# Patient Record
Sex: Female | Born: 1950
Health system: Southern US, Community
[De-identification: ages and names within clinical notes are randomized; demographics above are authoritative.]

## PROBLEM LIST (undated history)

## (undated) DIAGNOSIS — J439 Emphysema, unspecified: Secondary | ICD-10-CM

## (undated) DIAGNOSIS — I10 Essential (primary) hypertension: Secondary | ICD-10-CM

## (undated) DIAGNOSIS — F419 Anxiety disorder, unspecified: Secondary | ICD-10-CM

## (undated) DIAGNOSIS — E039 Hypothyroidism, unspecified: Secondary | ICD-10-CM

## (undated) DIAGNOSIS — E785 Hyperlipidemia, unspecified: Secondary | ICD-10-CM

## (undated) DIAGNOSIS — F32A Depression, unspecified: Secondary | ICD-10-CM

## (undated) DIAGNOSIS — Z8619 Personal history of other infectious and parasitic diseases: Secondary | ICD-10-CM

## (undated) DIAGNOSIS — I2510 Atherosclerotic heart disease of native coronary artery without angina pectoris: Secondary | ICD-10-CM

## (undated) DIAGNOSIS — H269 Unspecified cataract: Secondary | ICD-10-CM

## (undated) DIAGNOSIS — M797 Fibromyalgia: Secondary | ICD-10-CM

## (undated) DIAGNOSIS — G4710 Hypersomnia, unspecified: Secondary | ICD-10-CM

## (undated) DIAGNOSIS — F319 Bipolar disorder, unspecified: Secondary | ICD-10-CM

## (undated) DIAGNOSIS — M1990 Unspecified osteoarthritis, unspecified site: Secondary | ICD-10-CM

## (undated) DIAGNOSIS — K219 Gastro-esophageal reflux disease without esophagitis: Secondary | ICD-10-CM

## (undated) DIAGNOSIS — J189 Pneumonia, unspecified organism: Secondary | ICD-10-CM

## (undated) DIAGNOSIS — I5020 Unspecified systolic (congestive) heart failure: Secondary | ICD-10-CM

## (undated) DIAGNOSIS — R0600 Dyspnea, unspecified: Secondary | ICD-10-CM

## (undated) DIAGNOSIS — I255 Ischemic cardiomyopathy: Secondary | ICD-10-CM

## (undated) DIAGNOSIS — G4730 Sleep apnea, unspecified: Secondary | ICD-10-CM

## (undated) DIAGNOSIS — J449 Chronic obstructive pulmonary disease, unspecified: Secondary | ICD-10-CM

## (undated) DIAGNOSIS — E119 Type 2 diabetes mellitus without complications: Secondary | ICD-10-CM

## (undated) HISTORY — DX: Atherosclerotic heart disease of native coronary artery without angina pectoris: I25.10

## (undated) HISTORY — DX: Unspecified cataract: H26.9

## (undated) HISTORY — DX: Hypothyroidism, unspecified: E03.9

## (undated) HISTORY — DX: Essential (primary) hypertension: I10

## (undated) HISTORY — DX: Hyperlipidemia, unspecified: E78.5

## (undated) HISTORY — DX: Unspecified systolic (congestive) heart failure: I50.20

## (undated) HISTORY — DX: Dyspnea, unspecified: R06.00

## (undated) HISTORY — DX: Emphysema, unspecified: J43.9

## (undated) HISTORY — DX: Fibromyalgia: M79.7

## (undated) HISTORY — DX: Ischemic cardiomyopathy: I25.5

## (undated) HISTORY — PX: JOINT REPLACEMENT: SHX530

## (undated) HISTORY — DX: Sleep apnea, unspecified: G47.30

## (undated) HISTORY — DX: Gastro-esophageal reflux disease without esophagitis: K21.9

## (undated) HISTORY — DX: Chronic obstructive pulmonary disease, unspecified: J44.9

## (undated) HISTORY — DX: Anxiety disorder, unspecified: F41.9

## (undated) HISTORY — DX: Hypersomnia, unspecified: G47.10

## (undated) HISTORY — DX: Type 2 diabetes mellitus without complications: E11.9

## (undated) HISTORY — DX: Bipolar disorder, unspecified: F31.9

## (undated) HISTORY — DX: Depression, unspecified: F32.A

## (undated) HISTORY — DX: Personal history of other infectious and parasitic diseases: Z86.19

---

## 1999-02-28 ENCOUNTER — Other Ambulatory Visit: Admission: RE | Admit: 1999-02-28 | Discharge: 1999-02-28 | Payer: Self-pay | Admitting: Gynecology

## 1999-03-29 ENCOUNTER — Other Ambulatory Visit: Admission: RE | Admit: 1999-03-29 | Discharge: 1999-03-29 | Payer: Self-pay | Admitting: Gynecology

## 1999-03-29 ENCOUNTER — Encounter (INDEPENDENT_AMBULATORY_CARE_PROVIDER_SITE_OTHER): Payer: Self-pay | Admitting: Specialist

## 2000-04-16 ENCOUNTER — Other Ambulatory Visit: Admission: RE | Admit: 2000-04-16 | Discharge: 2000-04-16 | Payer: Self-pay | Admitting: Gynecology

## 2000-04-20 ENCOUNTER — Encounter (INDEPENDENT_AMBULATORY_CARE_PROVIDER_SITE_OTHER): Payer: Self-pay | Admitting: Specialist

## 2000-04-20 ENCOUNTER — Other Ambulatory Visit: Admission: RE | Admit: 2000-04-20 | Discharge: 2000-04-20 | Payer: Self-pay | Admitting: Gynecology

## 2000-04-27 ENCOUNTER — Encounter: Payer: Self-pay | Admitting: Gynecology

## 2000-04-27 ENCOUNTER — Encounter: Admission: RE | Admit: 2000-04-27 | Discharge: 2000-04-27 | Payer: Self-pay | Admitting: Gynecology

## 2000-10-18 ENCOUNTER — Encounter: Admission: RE | Admit: 2000-10-18 | Discharge: 2000-10-18 | Payer: Self-pay | Admitting: Gynecology

## 2000-10-18 ENCOUNTER — Encounter: Payer: Self-pay | Admitting: Gynecology

## 2000-10-26 ENCOUNTER — Encounter: Payer: Self-pay | Admitting: Gynecology

## 2000-10-26 ENCOUNTER — Encounter: Admission: RE | Admit: 2000-10-26 | Discharge: 2000-10-26 | Payer: Self-pay | Admitting: Gynecology

## 2001-04-23 ENCOUNTER — Other Ambulatory Visit: Admission: RE | Admit: 2001-04-23 | Discharge: 2001-04-23 | Payer: Self-pay | Admitting: Gynecology

## 2001-08-21 HISTORY — PX: BREAST CYST ASPIRATION: SHX578

## 2001-10-28 ENCOUNTER — Encounter: Admission: RE | Admit: 2001-10-28 | Discharge: 2001-10-28 | Payer: Self-pay | Admitting: Gynecology

## 2001-10-28 ENCOUNTER — Encounter: Payer: Self-pay | Admitting: Gynecology

## 2002-05-06 ENCOUNTER — Other Ambulatory Visit: Admission: RE | Admit: 2002-05-06 | Discharge: 2002-05-06 | Payer: Self-pay | Admitting: Gynecology

## 2002-08-21 HISTORY — PX: TOTAL ABDOMINAL HYSTERECTOMY: SHX209

## 2002-11-24 ENCOUNTER — Encounter: Payer: Self-pay | Admitting: Gynecology

## 2002-11-24 ENCOUNTER — Encounter: Admission: RE | Admit: 2002-11-24 | Discharge: 2002-11-24 | Payer: Self-pay | Admitting: Gynecology

## 2002-11-24 ENCOUNTER — Encounter (INDEPENDENT_AMBULATORY_CARE_PROVIDER_SITE_OTHER): Payer: Self-pay | Admitting: *Deleted

## 2003-12-16 ENCOUNTER — Encounter: Admission: RE | Admit: 2003-12-16 | Discharge: 2003-12-16 | Payer: Self-pay | Admitting: Internal Medicine

## 2004-08-21 HISTORY — PX: KNEE SURGERY: SHX244

## 2004-08-21 HISTORY — PX: ESOPHAGOGASTRODUODENOSCOPY: SHX1529

## 2004-11-01 ENCOUNTER — Encounter: Admission: RE | Admit: 2004-11-01 | Discharge: 2004-11-01 | Payer: Self-pay | Admitting: Internal Medicine

## 2004-12-21 ENCOUNTER — Ambulatory Visit: Payer: Self-pay | Admitting: Unknown Physician Specialty

## 2005-06-08 ENCOUNTER — Ambulatory Visit: Payer: Self-pay | Admitting: Cardiovascular Disease

## 2005-09-14 ENCOUNTER — Ambulatory Visit: Payer: Self-pay | Admitting: Internal Medicine

## 2005-09-28 ENCOUNTER — Ambulatory Visit: Payer: Self-pay | Admitting: Gastroenterology

## 2005-10-23 ENCOUNTER — Ambulatory Visit: Payer: Self-pay | Admitting: Specialist

## 2005-12-13 ENCOUNTER — Encounter: Admission: RE | Admit: 2005-12-13 | Discharge: 2005-12-13 | Payer: Self-pay | Admitting: Internal Medicine

## 2006-12-17 ENCOUNTER — Encounter: Admission: RE | Admit: 2006-12-17 | Discharge: 2006-12-17 | Payer: Self-pay | Admitting: Internal Medicine

## 2010-04-27 ENCOUNTER — Emergency Department: Payer: Self-pay | Admitting: Emergency Medicine

## 2010-05-20 ENCOUNTER — Ambulatory Visit: Payer: Self-pay | Admitting: Family Medicine

## 2010-08-21 DIAGNOSIS — E119 Type 2 diabetes mellitus without complications: Secondary | ICD-10-CM

## 2010-08-21 DIAGNOSIS — R0600 Dyspnea, unspecified: Secondary | ICD-10-CM

## 2010-08-21 HISTORY — DX: Type 2 diabetes mellitus without complications: E11.9

## 2010-08-21 HISTORY — DX: Dyspnea, unspecified: R06.00

## 2010-09-20 NOTE — Assessment & Plan Note (Signed)
Summary: FLU SHOT/EVM  NURSE VISIT - FLU SHOT ONLY  Assessment New Problems: NEED PROPHYLACTIC VACCINATION&INOCULATION FLU (ICD-V04.81)   The patient and/or caregiver has been counseled thoroughly with regard to medications prescribed including dosage, schedule, interactions, rationale for use, and possible side effects and they verbalize understanding.  Diagnoses and expected course of recovery discussed and will return if not improved as expected or if the condition worsens. Patient and/or caregiver verbalized understanding.   Orders Added: 1)  Flu Vaccine 13yrs + [16109]   Immunizations Administered:  Influenza Vaccine:    Vaccine Type: Fluzone    Site: left deltoid    Mfr: Sanofi Pasteur    Dose: 0.5 ml    Route: IM    Given by: Tacey Ruiz MD    Exp. Date: 02/18/2011    Lot #: [id number]    VIS given: 03/15/10 version given May 20, 2010.  Flu Vaccine Consent Questions:    Do you have a history of severe allergic reactions to this vaccine? no    Any prior history of allergic reactions to egg and/or gelatin? no    Do you have a sensitivity to the preservative Thimersol? no    Do you have a past history of Guillan-Barre Syndrome? no    Do you currently have an acute febrile illness? no    Have you ever had a severe reaction to latex? no    Vaccine information given and explained to patient? yes    Are you currently pregnant? no

## 2011-01-02 ENCOUNTER — Encounter: Payer: Self-pay | Admitting: Pulmonary Disease

## 2011-01-03 ENCOUNTER — Encounter: Payer: Self-pay | Admitting: Pulmonary Disease

## 2011-01-03 ENCOUNTER — Ambulatory Visit (INDEPENDENT_AMBULATORY_CARE_PROVIDER_SITE_OTHER): Payer: Self-pay | Admitting: Pulmonary Disease

## 2011-01-03 ENCOUNTER — Ambulatory Visit (INDEPENDENT_AMBULATORY_CARE_PROVIDER_SITE_OTHER)
Admission: RE | Admit: 2011-01-03 | Discharge: 2011-01-03 | Disposition: A | Discharge status: Home / Self Care | Payer: Self-pay | Source: Ambulatory Visit | Attending: Pulmonary Disease | Admitting: Pulmonary Disease

## 2011-01-03 VITALS — BP 140/82 | HR 75 | Temp 98.2°F | Ht 64.0 in | Wt 267.8 lb

## 2011-01-03 DIAGNOSIS — R0989 Other specified symptoms and signs involving the circulatory and respiratory systems: Secondary | ICD-10-CM

## 2011-01-03 DIAGNOSIS — R0600 Dyspnea, unspecified: Secondary | ICD-10-CM

## 2011-01-03 DIAGNOSIS — R0609 Other forms of dyspnea: Secondary | ICD-10-CM

## 2011-01-03 NOTE — Patient Instructions (Signed)
Chest xray today>>will call with results Spirometry today Follow up in July 2012

## 2011-01-03 NOTE — Progress Notes (Signed)
Subjective:    Patient ID: [name], female    DOB: March 25, 1951, 60 y.o.   MRN: [id number]  HPI 60 yo female with dyspnea.  She has noticed trouble with her breathing since 2008.  She went on disability at that time due to Bipolar disease.  She has since gained 70 lbs.  She has a long history of smoking, but quit 9 years ago.  She was told that she has COPD.  She has been using inhalers, but still has trouble with her breathing.  She now gets trouble even at rest.  She can't do any activity w/o getting winded.  She has an occasional dry cough.  She will wheeze and chest tightness sometimes.  She denies hemoptysis.  She gets palpitations with activity.  She has trouble sleeping when she lays flat, and feels like her breathing stops.  She does snore.  She will get leg swelling.  She denies fever, sore throat, or skin rashes.  She had a heart catheterization twice, and was told she had non-occlusive CAD.  She had pneumonia several years ago, and says she used to get these every couple of years.  She denies allergies.  She moved from [place] to [place] 17 yrs ago.  She also lived in [place] for 4 years.  She denies any recent travel.  She was on a blood pressure medicine, and says that this made her breathing much worse.  She does not recall which medicine this is.  She has a Emergency planning/management officer and dog.  She denies recent sick exposures.    She has not had insurance for several years, and has not been able to keep regular medical follow up appointments due to lack of resources.  She is due to qualify for Medicare part B in July.  Past Medical History  Diagnosis Date  . Migraine   . GERD (gastroesophageal reflux disease)   . Anxiety   . Chronic bronchitis   . Hypertension   . Bipolar disorder   . Coronary artery disease, non-occlusive   . Hypothyroidism      Family History  Problem Relation Age of Onset  . COPD Sister   . Asthma Maternal Uncle   . Lung cancer Cousin      History    Social History  . Marital Status: Single    Spouse Name: N/A    Number of Children: N/A  . Years of Education: N/A   Occupational History  . disabled    Social History Main Topics  . Smoking status: Former Smoker -- 1.0 packs/day for 43 years    Types: Cigarettes    Quit date: 08/21/2009  . Smokeless tobacco: Not on file  . Alcohol Use: No  . Drug Use: No  . Sexually Active: Not on file   Other Topics Concern  . Not on file   Social History Narrative  . No narrative on file     Allergies  Allergen Reactions  . Lithium     Was hospitalized     No outpatient prescriptions prior to visit.      Review of Systems  Constitutional: Positive for unexpected weight change.  HENT: Positive for trouble swallowing.   Respiratory: Positive for cough and shortness of breath.   Cardiovascular: Positive for chest pain and palpitations.  Gastrointestinal: Positive for abdominal pain.  Musculoskeletal: Positive for joint swelling.  Neurological: Positive for headaches.  Psychiatric/Behavioral: Positive for dysphoric mood.       Objective:  Physical Exam Filed Vitals:   01/03/11 1603  BP: 140/82  Pulse: 75  Temp: 98.2 F (36.8 C)  TempSrc: Oral  Height: 5\' 4"  (1.626 m)  Weight: 267 lb 12.8 oz (121.473 kg)  SpO2: 98%   General - Obese HEENT - wears glasses, PERRLA, EOMI, no sinus tenderness, no nasal discharge, MP 3, no oral exudate, no LAN Cardiac - s1s2, no murmur, peripheral pulses symmetric Chest - diminished breath sounds, no wheeze/rales/dullness Abd - obese, soft, non-tender, no masses, normal bowel sounds Ext - 1+ non-pitting ankle edema, no cyanosis/clubbing Neuro - normal strength, CN intact, A&O x 3 Psych - normal mood/behavior Skin - no rashes    Spirometry 01/03/11>>FEV1 2.22(89%), FEV1% 79 Assessment & Plan:   Dyspnea This is likely multifactorial.    She has a history of tobacco abuse, and carries a diagnosis of COPD.  However spirometry  today did not show evidence for airflow obstruction.  She has noticed some symptomatic relief with inhaler therapy, and will continue this for now.  I have given her samples of symbicort, and encouraged her to maintain her smoking cessation.  She has a history of hypertension, and certainly could have diastolic dysfunction.  Ideally, she would need to have a more complete evaluation with echocardiogram.  She is to continue with her blood pressure medications.  She has gained a significant amount of weight over the past few years, and deconditioning is likely playing a role with her dyspnea.  She likely also has sleep disordered breathing and would need to have a sleep test.  She is concerned about inability to pay for tests needed at this time.  She is agreeable to get a chest xray.  I will call her with the results of this.  Will then plan for follow up in July when she has Medicare part B, and can have further testing done.  Advised her to call if needed before this should her symptoms get worse.    Updated Medication List Outpatient Encounter Prescriptions as of 01/03/2011  Medication Sig Dispense Refill  . albuterol (PROVENTIL) (2.5 MG/3ML) 0.083% nebulizer solution 1 vial twice a day       . budesonide-formoterol (SYMBICORT) 80-4.5 MCG/ACT inhaler Inhale 2 puffs into the lungs 2 (two) times daily.        . diazepam (VALIUM) 5 MG tablet As needed       . levothyroxine (SYNTHROID, LEVOTHROID) 75 MCG tablet Take 75 mcg by mouth daily.        . metoprolol (LOPRESSOR) 50 MG tablet Take 50 mg by mouth 2 (two) times daily.        . QUEtiapine (SEROQUEL) 100 MG tablet Once at night       . triamterene-hydrochlorothiazide (DYAZIDE) 37.5-25 MG per capsule Take 1 capsule by mouth daily.

## 2011-01-03 NOTE — Assessment & Plan Note (Addendum)
This is likely multifactorial.    She has a history of tobacco abuse, and carries a diagnosis of COPD.  However spirometry today did not show evidence for airflow obstruction.  She has noticed some symptomatic relief with inhaler therapy, and will continue this for now.  I have given her samples of symbicort, and encouraged her to maintain her smoking cessation.  She has a history of hypertension, and certainly could have diastolic dysfunction.  Ideally, she would need to have a more complete evaluation with echocardiogram.  She is to continue with her blood pressure medications.  She has gained a significant amount of weight over the past few years, and deconditioning is likely playing a role with her dyspnea.  She likely also has sleep disordered breathing and would need to have a sleep test.  She is concerned about inability to pay for tests needed at this time.  She is agreeable to get a chest xray.  I will call her with the results of this.  Will then plan for follow up in July when she has Medicare part B, and can have further testing done.  Advised her to call if needed before this should her symptoms get worse.

## 2011-01-04 ENCOUNTER — Encounter: Payer: Self-pay | Admitting: Pulmonary Disease

## 2011-01-05 ENCOUNTER — Telehealth: Payer: Self-pay | Admitting: Pulmonary Disease

## 2011-01-05 NOTE — Telephone Encounter (Signed)
Please inform her the chest xray showed expected changes from COPD.  No other significant findings.

## 2011-01-05 NOTE — Telephone Encounter (Signed)
Please advise Dr. Craige Cotta, pt requesting her CXR results. Thanks  Carver Fila, CMA

## 2011-01-05 NOTE — Telephone Encounter (Signed)
Spoke w/ pt and made her aware of cxr results. Pt verbalized understanding and had no questions

## 2011-01-05 NOTE — Telephone Encounter (Signed)
[id number] x1 

## 2011-01-06 ENCOUNTER — Telehealth: Payer: Self-pay | Admitting: Pulmonary Disease

## 2011-01-06 MED ORDER — PREDNISONE 10 MG PO TABS: ORAL_TABLET | ORAL | Status: DC

## 2011-01-06 NOTE — Telephone Encounter (Signed)
Can call in script for prednisone 10 mg pills: 3 pills for 2 days, 2 pills for 2 days, 1 pill for 2 days, 1/2 pill for 2 days.  Dispense 13 pills with no refills.

## 2011-01-06 NOTE — Telephone Encounter (Signed)
Spoke w/ pt and she is aware prednisone taper was sent to pharmacy and had no further questions

## 2011-01-06 NOTE — Telephone Encounter (Signed)
Spoke w/ pt and she states she would like something called in for her extreme SOB. Pt states it is not getting any better and states her breathing is getting worse w/ activity. Pt wants a steroid called in if possible to help her out. Pt aware VS is out of the office until this afternoon and was fine with that. Please advise Dr. Craige Cotta. Thanks  Allergies  Allergen Reactions  . Lithium     Was hospitalized    Carver Fila, CMA

## 2011-01-12 ENCOUNTER — Encounter: Payer: Self-pay | Admitting: Pulmonary Disease

## 2011-03-15 ENCOUNTER — Encounter: Payer: Self-pay | Admitting: Pulmonary Disease

## 2011-03-15 ENCOUNTER — Ambulatory Visit (INDEPENDENT_AMBULATORY_CARE_PROVIDER_SITE_OTHER): Payer: Medicare Other | Admitting: Pulmonary Disease

## 2011-03-15 ENCOUNTER — Other Ambulatory Visit (INDEPENDENT_AMBULATORY_CARE_PROVIDER_SITE_OTHER): Payer: Medicare Other

## 2011-03-15 VITALS — BP 126/80 | HR 65 | Temp 97.7°F | Ht 64.0 in | Wt 268.9 lb

## 2011-03-15 DIAGNOSIS — R0600 Dyspnea, unspecified: Secondary | ICD-10-CM

## 2011-03-15 DIAGNOSIS — E1169 Type 2 diabetes mellitus with other specified complication: Secondary | ICD-10-CM | POA: Insufficient documentation

## 2011-03-15 DIAGNOSIS — R0609 Other forms of dyspnea: Secondary | ICD-10-CM

## 2011-03-15 DIAGNOSIS — R079 Chest pain, unspecified: Secondary | ICD-10-CM

## 2011-03-15 DIAGNOSIS — R0989 Other specified symptoms and signs involving the circulatory and respiratory systems: Secondary | ICD-10-CM

## 2011-03-15 DIAGNOSIS — R7309 Other abnormal glucose: Secondary | ICD-10-CM

## 2011-03-15 DIAGNOSIS — E118 Type 2 diabetes mellitus with unspecified complications: Secondary | ICD-10-CM | POA: Insufficient documentation

## 2011-03-15 DIAGNOSIS — R739 Hyperglycemia, unspecified: Secondary | ICD-10-CM

## 2011-03-15 DIAGNOSIS — G4710 Hypersomnia, unspecified: Secondary | ICD-10-CM

## 2011-03-15 LAB — HEMOGLOBIN A1C: Hgb A1c MFr Bld: 8 % — ABNORMAL HIGH (ref 4.6–6.5)

## 2011-03-15 LAB — CBC WITH DIFFERENTIAL/PLATELET
Basophils Relative: 0.3 % (ref 0.0–3.0)
Eosinophils Absolute: 0 10*3/uL (ref 0.0–0.7)
Eosinophils Relative: 0 % (ref 0.0–5.0)
Lymphocytes Relative: 25.1 % (ref 12.0–46.0)
Monocytes Relative: 5 % (ref 3.0–12.0)
Neutrophils Relative %: 69.6 % (ref 43.0–77.0)
RBC: 4.35 Mil/uL (ref 3.87–5.11)
WBC: 14.8 10*3/uL — ABNORMAL HIGH (ref 4.5–10.5)

## 2011-03-15 LAB — COMPREHENSIVE METABOLIC PANEL
Albumin: 4.1 g/dL (ref 3.5–5.2)
BUN: 17 mg/dL (ref 6–23)
Calcium: 9.1 mg/dL (ref 8.4–10.5)
Chloride: 98 mEq/L (ref 96–112)
GFR: 75.97 mL/min (ref >60.00)
Glucose, Bld: 101 mg/dL — ABNORMAL HIGH (ref 70–99)
Potassium: 4 mEq/L (ref 3.5–5.1)

## 2011-03-15 NOTE — Progress Notes (Signed)
Subjective:    Patient ID: [name], female    DOB: Oct 25, 1950, 60 y.o.   MRN: [id number]  HPI 60 yo female former smoker with dyspnea.  She has been approved for medicare part B.  She continues to have dyspnea with minimal exertion.  She is also getting chest pain with exertion.  She does not have much cough or wheeze.  She feels that symbicort helps.  She also gets chest pain when using albuterol in nebulizer.  She continues to have trouble with her sleep.  She still snores, and her husband says she stops breathing while asleep.  Past Medical History  Diagnosis Date  . Migraine   . GERD (gastroesophageal reflux disease)   . Anxiety   . Chronic bronchitis   . Hypertension   . Bipolar disorder   . Coronary artery disease, non-occlusive   . Hypothyroidism      Review of Systems     Objective:   Physical Exam  BP 126/80  Pulse 65  Temp(Src) 97.7 F (36.5 C) (Oral)  Ht 5\' 4"  (1.626 m)  Wt 268 lb 14.4 oz (121.972 kg)  BMI 46.16 kg/m2  SpO2 98%  General - Obese HEENT - no sinus tenderness, no oral lesions Cardiac - s1s2 regular Chest - decreased breath sounds, no wheeze Abd - obese, soft, nontender Ext - minimal ankle edema Neuro - normal strength, CN intact Psych - normal mood, behavior       Assessment & Plan:   Dyspnea This is likely multifactorial.   She is a former smoker and carries a diagnosis of COPD.  Recent spirometry did not show obstruction.  Will arrange for full pulmonary function testing to further evaluate.  She can continue symbicort for now since she has symptomatic benefit.  She has a history of hypertension, and certainly could have diastolic dysfunction.  She is also c/o chest pain on exertion.  I am concerned she could have underlaying coronary artery disease and diastolic dysfunction.  To further assess she is agreeable to have further evaluation by cardiology.  She has gained a significant amount of weight over the past few years, and  deconditioning is likely playing a role with her dyspnea.  Chest pain Will refer to cardiology.  Elevated blood sugar level Will check labs including thyroid function and HbA1C.  Advised that she may need to have further therapy for diabetes.  Hypersomnia I am concerned that she has sleep apnea.  To further assess will arrange for sleep study.    Updated Medication List Outpatient Encounter Prescriptions as of 03/15/2011  Medication Sig Dispense Refill  . albuterol (PROVENTIL) (2.5 MG/3ML) 0.083% nebulizer solution 1 vial twice a day       . budesonide-formoterol (SYMBICORT) 80-4.5 MCG/ACT inhaler Inhale 2 puffs into the lungs 2 (two) times daily.        . diazepam (VALIUM) 5 MG tablet As needed       . levothyroxine (SYNTHROID, LEVOTHROID) 75 MCG tablet Take 75 mcg by mouth daily.        . metoprolol (LOPRESSOR) 50 MG tablet Take 50 mg by mouth 2 (two) times daily.        . QUEtiapine (SEROQUEL) 100 MG tablet Once at night       . triamterene-hydrochlorothiazide (DYAZIDE) 37.5-25 MG per capsule Take 1 capsule by mouth daily.        Marland Kitchen DISCONTD: predniSONE (DELTASONE) 10 MG tablet Take 3 tablets x 2 days, 2 tablets x 2 days, 1 tablet  x 2 days, 1/2 tablet x 2 days  13 tablet  0

## 2011-03-15 NOTE — Patient Instructions (Signed)
Lab tests today Will schedule breathing test (PFT) and sleep test Will arrange for cardiology evaluation Follow up in 3 to 4 weeks

## 2011-03-16 ENCOUNTER — Ambulatory Visit (INDEPENDENT_AMBULATORY_CARE_PROVIDER_SITE_OTHER): Payer: Medicare Other | Admitting: Cardiovascular Disease

## 2011-03-16 ENCOUNTER — Encounter: Payer: Self-pay | Admitting: Cardiology

## 2011-03-16 ENCOUNTER — Telehealth: Payer: Self-pay | Admitting: Pulmonary Disease

## 2011-03-16 ENCOUNTER — Encounter: Payer: Self-pay | Admitting: Cardiovascular Disease

## 2011-03-16 VITALS — BP 139/75 | HR 74 | Ht 64.0 in | Wt 267.0 lb

## 2011-03-16 DIAGNOSIS — G4710 Hypersomnia, unspecified: Secondary | ICD-10-CM

## 2011-03-16 DIAGNOSIS — R0989 Other specified symptoms and signs involving the circulatory and respiratory systems: Secondary | ICD-10-CM

## 2011-03-16 DIAGNOSIS — R079 Chest pain, unspecified: Secondary | ICD-10-CM

## 2011-03-16 DIAGNOSIS — R0600 Dyspnea, unspecified: Secondary | ICD-10-CM

## 2011-03-16 DIAGNOSIS — I2510 Atherosclerotic heart disease of native coronary artery without angina pectoris: Secondary | ICD-10-CM | POA: Insufficient documentation

## 2011-03-16 DIAGNOSIS — R0609 Other forms of dyspnea: Secondary | ICD-10-CM

## 2011-03-16 DIAGNOSIS — G4733 Obstructive sleep apnea (adult) (pediatric): Secondary | ICD-10-CM | POA: Insufficient documentation

## 2011-03-16 HISTORY — DX: Hypersomnia, unspecified: G47.10

## 2011-03-16 NOTE — Telephone Encounter (Signed)
Discussed results with pt.  Explained that HbA1C and TSH are elevated.  Advised to d/w primary care.

## 2011-03-16 NOTE — Assessment & Plan Note (Signed)
See above

## 2011-03-16 NOTE — Assessment & Plan Note (Signed)
This is likely multifactorial.   She is a former smoker and carries a diagnosis of COPD.  Recent spirometry did not show obstruction.  Will arrange for full pulmonary function testing to further evaluate.  She can continue symbicort for now since she has symptomatic benefit.  She has a history of hypertension, and certainly could have diastolic dysfunction.  She is also c/o chest pain on exertion.  I am concerned she could have underlaying coronary artery disease and diastolic dysfunction.  To further assess she is agreeable to have further evaluation by cardiology.  She has gained a significant amount of weight over the past few years, and deconditioning is likely playing a role with her dyspnea.

## 2011-03-16 NOTE — Telephone Encounter (Signed)
Pt requesting lab results from yesterday. Please advise.Carron Curie, CMA

## 2011-03-16 NOTE — Progress Notes (Signed)
History of Present Illness:60 yo WF with history of obesity, GERD, COPD, HTN, hypothyroidism, bipolar disorder, mild CAD by cath 2006 at [facility] who is here today as a new patient for evaluation of chest pain/SOB. She was seen by Dr. Craige Cotta yesterday. She was told that her CAD was moderate at that time. She has continued to smoke but stopped last year. She describes pressure in the center of her chest that occurs with minimal exertion. She has profound dyspnea with minimal exertion. She also noticed some fluttering of her heart yesterday. No near syncope or syncope but some dizziness. She has also had jaw pain when lying in bed.   Past Medical History  Diagnosis Date  . Migraine   . GERD (gastroesophageal reflux disease)   . Anxiety   . Chronic bronchitis   . Hypertension   . Bipolar disorder   . Coronary artery disease, non-occlusive   . Hypothyroidism     Past Surgical History  Procedure Date  . Total abdominal hysterectomy   . Knee surgery     left  . Cardiac catherization     Current Outpatient Prescriptions  Medication Sig Dispense Refill  . albuterol (PROVENTIL) (2.5 MG/3ML) 0.083% nebulizer solution 1 vial twice a day       . aspirin 325 MG EC tablet Take 325 mg by mouth daily.        . budesonide-formoterol (SYMBICORT) 80-4.5 MCG/ACT inhaler Inhale 2 puffs into the lungs 2 (two) times daily.        . diazepam (VALIUM) 5 MG tablet As needed       . levothyroxine (SYNTHROID, LEVOTHROID) 75 MCG tablet Take 75 mcg by mouth daily.        . metoprolol (LOPRESSOR) 50 MG tablet Take 50 mg by mouth 2 (two) times daily.        . QUEtiapine (SEROQUEL) 100 MG tablet Once at night       . triamterene-hydrochlorothiazide (DYAZIDE) 37.5-25 MG per capsule Take 1 capsule by mouth daily.          Allergies  Allergen Reactions  . Lithium     Was hospitalized    History   Social History  . Marital Status: Single    Spouse Name: N/A    Number of Children: N/A    . Years of Education: N/A   Occupational History  . disabled    Social History Main Topics  . Smoking status: Former Smoker -- 1.0 packs/day for 43 years    Types: Cigarettes    Quit date: 08/21/2009  . Smokeless tobacco: Not on file  . Alcohol Use: No  . Drug Use: No  . Sexually Active: Not on file   Other Topics Concern  . Not on file   Social History Narrative  . No narrative on file    Family History  Problem Relation Age of Onset  . COPD Sister   . Asthma Maternal Uncle   . Lung cancer Cousin     Review of Systems:  As stated in the HPI and otherwise negative.   BP 139/75  Pulse 74  Ht 5\' 4"  (1.626 m)  Wt 267 lb (121.11 kg)  BMI 45.83 kg/m2  Physical Examination: General: Well developed, well nourished, NAD HEENT: OP clear, mucus membranes moist SKIN: warm, dry. No rashes. Neuro: No focal deficits Musculoskeletal: Muscle strength 5/5 all ext Psychiatric: Mood and affect normal Neck: No JVD, no carotid bruits, no thyromegaly, no lymphadenopathy. Lungs:Clear bilaterally, no  wheezes, rhonci, crackles Cardiovascular: Regular rate and rhythm. No murmurs, gallops or rubs. Abdomen:Soft. Bowel sounds present. Non-tender.  Extremities: No lower extremity edema. Pulses are 2 + in the bilateral DP/PT.  EKG:NSR, rate 74 bpm. Non-specific ST and T wave abnormalities.

## 2011-03-16 NOTE — Assessment & Plan Note (Signed)
She has moderate CAD by cath (by report) in [facility] 2006. Now having exertional chest pain and dyspnea. Will arrange right and left heart cath on 03/23/11 in the outpt cath lab at [facility]. Labs today. All risks and benefits reviewed. Will also arrange echo.

## 2011-03-16 NOTE — Assessment & Plan Note (Signed)
I am concerned that she has sleep apnea.  To further assess will arrange for sleep study.

## 2011-03-16 NOTE — Patient Instructions (Signed)
Your physician recommends that you schedule a follow-up appointment in: 3 weeks with Dr. Clifton James  Your physician has requested that you have a cardiac catheterization. Cardiac catheterization is used to diagnose and/or treat various heart conditions. Doctors may recommend this procedure for a number of different reasons. The most common reason is to evaluate chest pain. Chest pain can be a symptom of coronary artery disease (CAD), and cardiac catheterization can show whether plaque is narrowing or blocking your heart's arteries. This procedure is also used to evaluate the valves, as well as measure the blood flow and oxygen levels in different parts of your heart. For further information please visit [email or web address] Please follow instruction sheet, as given.  Your physician has requested that you have an echocardiogram. Echocardiography is a painless test that uses sound waves to create images of your heart. It provides your doctor with information about the size and shape of your heart and how well your heart's chambers and valves are working. This procedure takes approximately one hour. There are no restrictions for this procedure.

## 2011-03-16 NOTE — Assessment & Plan Note (Signed)
Will check labs including thyroid function and HbA1C.  Advised that she may need to have further therapy for diabetes.

## 2011-03-16 NOTE — Assessment & Plan Note (Signed)
Will refer to cardiology 

## 2011-03-17 LAB — PROTIME-INR: Prothrombin Time: 10.8 s (ref 10.2–12.4)

## 2011-03-20 ENCOUNTER — Telehealth: Payer: Self-pay | Admitting: Pulmonary Disease

## 2011-03-20 NOTE — Telephone Encounter (Signed)
Labs were faxed to Dr Vear Clock at the number requested, spoke with pt and notified that this was done.

## 2011-03-22 HISTORY — PX: OTHER SURGICAL HISTORY: SHX169

## 2011-03-23 ENCOUNTER — Inpatient Hospital Stay (HOSPITAL_BASED_OUTPATIENT_CLINIC_OR_DEPARTMENT_OTHER)
Admission: RE | Admit: 2011-03-23 | Discharge: 2011-03-23 | Disposition: A | Discharge status: Home / Self Care | Payer: Medicare Other | Source: Ambulatory Visit | Attending: Cardiovascular Disease | Admitting: Cardiovascular Disease

## 2011-03-23 DIAGNOSIS — R0602 Shortness of breath: Secondary | ICD-10-CM | POA: Insufficient documentation

## 2011-03-23 DIAGNOSIS — E039 Hypothyroidism, unspecified: Secondary | ICD-10-CM | POA: Insufficient documentation

## 2011-03-23 DIAGNOSIS — I2510 Atherosclerotic heart disease of native coronary artery without angina pectoris: Secondary | ICD-10-CM

## 2011-03-23 DIAGNOSIS — J449 Chronic obstructive pulmonary disease, unspecified: Secondary | ICD-10-CM | POA: Insufficient documentation

## 2011-03-23 DIAGNOSIS — K219 Gastro-esophageal reflux disease without esophagitis: Secondary | ICD-10-CM | POA: Insufficient documentation

## 2011-03-23 DIAGNOSIS — I10 Essential (primary) hypertension: Secondary | ICD-10-CM | POA: Insufficient documentation

## 2011-03-23 DIAGNOSIS — J4489 Other specified chronic obstructive pulmonary disease: Secondary | ICD-10-CM | POA: Insufficient documentation

## 2011-03-23 DIAGNOSIS — R079 Chest pain, unspecified: Secondary | ICD-10-CM | POA: Insufficient documentation

## 2011-03-23 LAB — POCT I-STAT 3, VENOUS BLOOD GAS (G3P V)
O2 Saturation: 68 %
pCO2, Ven: 45 mmHg (ref 45.0–50.0)
pH, Ven: 7.405 — ABNORMAL HIGH (ref 7.250–7.300)

## 2011-03-23 LAB — POCT I-STAT GLUCOSE: Operator id: 141321

## 2011-03-23 LAB — POCT I-STAT 3, ART BLOOD GAS (G3+)
Bicarbonate: 26.9 mEq/L — ABNORMAL HIGH (ref 20.0–24.0)
pH, Arterial: 7.411 — ABNORMAL HIGH (ref 7.350–7.400)

## 2011-03-26 NOTE — Cardiovascular Report (Signed)
NAMELACHE, [name] NO.:  [id number]  MEDICAL RECORD NO.:  [id number]  LOCATION:                                 FACILITY:  PHYSICIAN:  [name], MDDATE OF BIRTH:  1951/03/31  DATE OF PROCEDURE:  03/23/2011 DATE OF DISCHARGE:                           CARDIAC CATHETERIZATION   PRIMARY PULMONOLOGIST:  Coralyn Helling, MD.  PRIMARY CARE PHYSICIAN:  Loma Sender, MD at [facility].  PROCEDURES PERFORMED: 1. Left heart catheterization. 2. Right heart catheterization. 3. Selective coronary angiography. 4. Left ventricular angiogram.  OPERATOR:  [name], MD  INDICATION:  This is a 60 year old Caucasian female with a history of morbid obesity, GERD, COPD, hypertension, hypothyroidism, and a history of mild coronary artery disease by cath in 2006, who I saw in the office last week for evaluation of chest pain and shortness of breath.  The patient's shortness of breath was felt to be multifactorial.  She had recently been seen by her pulmonologist, Dr. Coralyn Helling.  Because of her chest discomfort and her history of mild coronary artery disease, I felt that it was important to perform a left heart catheterization to exclude severe coronary artery disease and also a right heart catheterization to assess her pulmonary artery pressures.  This testing was arranged for today.  DETAILS OF PROCEDURE:  The patient was brought to the outpatient cardiac catheterization laboratory after signing informed consent for the procedure.  The right groin was prepped and draped in sterile fashion. Lidocaine 1% was used for local anesthesia.  A 4-French sheath was inserted into the right femoral artery without difficulty.  A 6-French sheath was inserted into the right femoral vein without difficulty.  A multipurpose catheter was used to perform a right heart catheterization. We then used a JL-5 catheter to perform selective angiography of the left  coronary system.  A 3DRC catheter was used to perform selective angiography of the native right coronary artery.  A pigtail catheter was used to perform a left ventricular angiogram.  The patient tolerated the procedure well.  There were no immediate complications.  She was taken to the recovery area in stable condition.  HEMODYNAMIC FINDINGS:  Central aortic pressure 146/75, left ventricular pressure 146/14/20, right atrial pressure 7, right ventricular pressure 37/8/12, pulmonary artery pressure 33/12/22, pulmonary capillary wedge pressure 11.  Cardiac output 5.3 L per minute.  Cardiac index 2.4 L per minute per meter squared.  Pulmonary artery saturation 68%.  Central aortic saturation 96%.  ANGIOGRAPHIC FINDINGS: 1. The left main coronary artery had no evidence of disease. 2. The left anterior descending was a large vessel that coursed to the     apex and gave off several very small-caliber diagonal branches.     There was mild 20% plaque in the mid LAD.  There were no focally     obstructive lesions noted. 3. The circumflex artery gave off a moderate-sized obtuse marginal     branch.  The circumflex artery had no obstructive lesions noted. 4. The right coronary artery was a small-to-moderate-sized vessel that     was dominant.  There was mild 20% plaque in the mid vessel.  There     were no  obstructive lesions noted. 5. Left ventricular angiogram was performed in the RAO projection and     showed overall ejection fraction of 55%.  There were no wall motion     abnormalities noted.  IMPRESSION: 1. Mild nonobstructive coronary artery disease. 2. Normal left ventricular systolic function. 3. Normal filling pressures. 4. Dyspnea, most likely multifactorial and related to her underlying     pulmonary disease as well as her morbid obesity.  RECOMMENDATIONS:  I do not think that the patient needs any further ischemic cardiac workup at this time.  We have planned for  an echocardiogram to be performed in our office next week to exclude any valvular heart disease.  I will see her back in the office and review the findings of all the testing.     [name], MD     CM/MEDQ  D:  03/23/2011  T:  03/23/2011  Job:  [id number]  cc:   Coralyn Helling, MD Loma Sender, MD  Electronically Signed by [name] MD on 03/26/2011 09:19:45 PM

## 2011-03-28 ENCOUNTER — Ambulatory Visit (HOSPITAL_BASED_OUTPATIENT_CLINIC_OR_DEPARTMENT_OTHER): Payer: Medicare Other | Attending: Pulmonary Disease

## 2011-03-28 DIAGNOSIS — R0600 Dyspnea, unspecified: Secondary | ICD-10-CM

## 2011-03-28 DIAGNOSIS — I10 Essential (primary) hypertension: Secondary | ICD-10-CM | POA: Insufficient documentation

## 2011-03-28 DIAGNOSIS — G4733 Obstructive sleep apnea (adult) (pediatric): Secondary | ICD-10-CM | POA: Insufficient documentation

## 2011-03-28 DIAGNOSIS — Z79899 Other long term (current) drug therapy: Secondary | ICD-10-CM | POA: Insufficient documentation

## 2011-04-05 ENCOUNTER — Ambulatory Visit (INDEPENDENT_AMBULATORY_CARE_PROVIDER_SITE_OTHER): Payer: Medicare Other | Admitting: Pulmonary Disease

## 2011-04-05 ENCOUNTER — Encounter: Payer: Self-pay | Admitting: Pulmonary Disease

## 2011-04-05 VITALS — BP 160/88 | HR 77 | Temp 97.9°F | Ht 64.0 in | Wt 266.0 lb

## 2011-04-05 DIAGNOSIS — G4710 Hypersomnia, unspecified: Secondary | ICD-10-CM

## 2011-04-05 DIAGNOSIS — R0989 Other specified symptoms and signs involving the circulatory and respiratory systems: Secondary | ICD-10-CM

## 2011-04-05 DIAGNOSIS — J42 Unspecified chronic bronchitis: Secondary | ICD-10-CM

## 2011-04-05 DIAGNOSIS — R0600 Dyspnea, unspecified: Secondary | ICD-10-CM

## 2011-04-05 DIAGNOSIS — I2510 Atherosclerotic heart disease of native coronary artery without angina pectoris: Secondary | ICD-10-CM

## 2011-04-05 DIAGNOSIS — E039 Hypothyroidism, unspecified: Secondary | ICD-10-CM | POA: Insufficient documentation

## 2011-04-05 LAB — PULMONARY FUNCTION TEST

## 2011-04-05 NOTE — Progress Notes (Signed)
PFT done today. 

## 2011-04-05 NOTE — Patient Instructions (Signed)
Will schedule thyroid ultrasound and call with results Follow up in 4 months

## 2011-04-05 NOTE — Progress Notes (Signed)
Subjective:    Patient ID: [name], female    DOB: [birth year], 60 y.o.   MRN: [id number]  HPI CC: [name]  60 yo female former smoker with dyspnea.  Since her last visit she had heart catheterization with cardiology.  She had non-obstructive coronary disease.  Otherwise was unremarkable.  She continues to get winded with minimal exertion.  She has occasional cough with clear sputum.  She denies smoking.  She has been using symbicort twice per day, but is not sure how much this is helping.  She denies chest pain, fever, or leg swelling.  She had her thyroid medicine increased by PCP.  She has noticed more fullness in her neck.  Past Medical History  Diagnosis Date  . Migraine   . GERD (gastroesophageal reflux disease)   . Anxiety   . Chronic bronchitis   . Hypertension   . Bipolar disorder   . Coronary artery disease, non-occlusive   . Hypothyroidism   . Fibromyalgia     Review of Systems     Objective:   Physical Exam BP 160/88  Pulse 77  Temp(Src) 97.9 F (36.6 C) (Oral)  Ht 5\' 4"  (1.626 m)  Wt 266 lb (120.657 kg)  BMI 45.66 kg/m2  SpO2 96%  General - Obese  HEENT - no sinus tenderness, no oral lesions, mild fullness in thyroid region Cardiac - s1s2 regular  Chest - decreased breath sounds, no wheeze  Abd - obese, soft, nontender  Ext - no edema  Neuro - normal strength, CN intact  Psych - normal mood, behavior  Lt & Rt heart cath 03/23/11>>Central aortic pressure 146/75, LV146/14/20, RA 7, RV 37/8/12, PA 33/12/22, PCWP 11. CO 5.3 L/min. CI 2.4 L/min/m2 per. PAsaturation 68%. Central aortic saturation 96%.  Lt heart cath 03/23/11>>1. The left main coronary artery had no evidence of disease. 2. The left anterior descending was a large vessel that coursed to the apex and gave off several very small-caliber diagonal branches. There was mild 20% plaque in the mid LAD. There were no focally obstructive lesions noted. 3. The circumflex artery gave off a  moderate-sized obtuse marginal branch. The circumflex artery had no obstructive lesions noted. 4. The right coronary artery was a small-to-moderate-sized vessel that was dominant. There was mild 20% plaque in the mid vessel. There were no obstructive lesions noted. 5. Left ventricular angiogram was performed in the RAO projection and showed overall ejection fraction of 55%. There were no wall motion abnormalities noted.  PFT 04/05/11>>FEV1 2.39(103%), FEV1% 80, TLC 4.46(91%), DLCO 68%, no BD.  Normal spirometry, no bronchodilator response, normal lung volumes, mild diffusion defect corrects for lung volumes.    Assessment & Plan:   Hypothyroidism She notes increasing swelling in her next.  She has difficult to control hypothyroidism.  I will arrange for thyroid ultrasound to determine if she goiter which could be contributing to sensation of dyspnea.  Dyspnea She has prior history of smoking.  However, her PFT today was relatively normal and did not show evidence for airflow obstruction.  Her recent cardiac evaluation has been unremarkable.  She is scheduled for Echo to exclude valvular heart disease.  I explained that her evaluation for a cause of her dyspnea has been unrevealing so far.  Explained that her dyspnea most likely is related to obesity and deconditioning.  Of note is that she does have some fullness in her neck in association with hypothyroidism.  Will have her get thyroid ultrasound to exclude presence of goiter  that could be contributing to her sensation of dyspnea.  Chronic bronchitis Former smoker.  No evidence for airflow obstruction on PFT today.  Advised her she can wean off symbicort as tolerated.  Hypersomnia Follow up sleep study.    Updated Medication List Outpatient Encounter Prescriptions as of 04/05/2011  Medication Sig Dispense Refill  . albuterol (PROVENTIL) (2.5 MG/3ML) 0.083% nebulizer solution 1 vial twice a day       . aspirin 325 MG EC tablet Take 325 mg by  mouth daily.        . budesonide-formoterol (SYMBICORT) 80-4.5 MCG/ACT inhaler Inhale 2 puffs into the lungs 2 (two) times daily.        . diazepam (VALIUM) 5 MG tablet As needed       . levothyroxine (SYNTHROID, LEVOTHROID) 75 MCG tablet Take 75 mcg by mouth daily.        . metoprolol (LOPRESSOR) 50 MG tablet Take 50 mg by mouth 2 (two) times daily.        Marland Kitchen triamterene-hydrochlorothiazide (DYAZIDE) 37.5-25 MG per capsule Take 1 capsule by mouth daily.        . ziprasidone (GEODON) 40 MG capsule 1 tablet twice a day      . DISCONTD: QUEtiapine (SEROQUEL) 100 MG tablet Once at night

## 2011-04-05 NOTE — Assessment & Plan Note (Signed)
She notes increasing swelling in her next.  She has difficult to control hypothyroidism.  I will arrange for thyroid ultrasound to determine if she goiter which could be contributing to sensation of dyspnea.

## 2011-04-06 DIAGNOSIS — J42 Unspecified chronic bronchitis: Secondary | ICD-10-CM | POA: Insufficient documentation

## 2011-04-06 NOTE — Assessment & Plan Note (Signed)
She has prior history of smoking.  However, her PFT today was relatively normal and did not show evidence for airflow obstruction.  Her recent cardiac evaluation has been unremarkable.  She is scheduled for Echo to exclude valvular heart disease.  I explained that her evaluation for a cause of her dyspnea has been unrevealing so far.  Explained that her dyspnea most likely is related to obesity and deconditioning.  Of note is that she does have some fullness in her neck in association with hypothyroidism.  Will have her get thyroid ultrasound to exclude presence of goiter that could be contributing to her sensation of dyspnea.

## 2011-04-06 NOTE — Assessment & Plan Note (Signed)
Former smoker.  No evidence for airflow obstruction on PFT today.  Advised her she can wean off symbicort as tolerated.

## 2011-04-06 NOTE — Assessment & Plan Note (Signed)
Follow up sleep study

## 2011-04-07 ENCOUNTER — Encounter: Payer: Self-pay | Admitting: Cardiovascular Disease

## 2011-04-07 ENCOUNTER — Ambulatory Visit (HOSPITAL_COMMUNITY): Payer: Medicare Other | Attending: Cardiovascular Disease | Admitting: Radiology

## 2011-04-07 ENCOUNTER — Ambulatory Visit (INDEPENDENT_AMBULATORY_CARE_PROVIDER_SITE_OTHER): Payer: Medicare Other | Admitting: Cardiovascular Disease

## 2011-04-07 VITALS — BP 122/74 | HR 60 | Ht 64.0 in | Wt 265.8 lb

## 2011-04-07 DIAGNOSIS — R0609 Other forms of dyspnea: Secondary | ICD-10-CM | POA: Insufficient documentation

## 2011-04-07 DIAGNOSIS — R0989 Other specified symptoms and signs involving the circulatory and respiratory systems: Secondary | ICD-10-CM

## 2011-04-07 DIAGNOSIS — I2510 Atherosclerotic heart disease of native coronary artery without angina pectoris: Secondary | ICD-10-CM

## 2011-04-07 DIAGNOSIS — R0600 Dyspnea, unspecified: Secondary | ICD-10-CM

## 2011-04-07 DIAGNOSIS — E785 Hyperlipidemia, unspecified: Secondary | ICD-10-CM

## 2011-04-07 DIAGNOSIS — I079 Rheumatic tricuspid valve disease, unspecified: Secondary | ICD-10-CM | POA: Insufficient documentation

## 2011-04-07 NOTE — Assessment & Plan Note (Addendum)
Stable. Mild disease. Continue beta blocker and ASA. Will start statin. Crestor 5 mg po QHS. Will give samples today. Will check LFTs and lipids in 12 weeks. No further cardiac workup at this time.

## 2011-04-07 NOTE — Progress Notes (Signed)
History of Present Illness:60 yo WF with history of obesity, GERD, COPD, HTN, hypothyroidism, bipolar disorder, mild CAD by cath 2006 at [facility] who is here today for cardiac follow up. I saw her as a new patient for evaluation of chest pain/SOB on 03/16/11. Her pulmonary workup has been per Dr. Craige Cotta. She describesd pressure in the center of her chest that occurred with minimal exertion. She has profound dyspnea with minimal exertion. No near syncope or syncope but some dizziness. She has also had jaw pain when lying in bed. I arranged a left heart cath on 03/23/11 and there was minimal CAD with normal LV function. Echo today with normal LV size and function with no valvular heart disease. She quit smoking one year ago.    Past Medical History  Diagnosis Date  . Migraine   . GERD (gastroesophageal reflux disease)   . Anxiety   . Chronic bronchitis   . Hypertension   . Bipolar disorder   . Coronary artery disease, non-occlusive   . Hypothyroidism   . Fibromyalgia     Past Surgical History  Procedure Date  . Total abdominal hysterectomy   . Knee surgery     left  . Cardiac catherization     Current Outpatient Prescriptions  Medication Sig Dispense Refill  . albuterol (PROVENTIL) (2.5 MG/3ML) 0.083% nebulizer solution 1 vial twice a day       . aspirin 325 MG EC tablet Take 325 mg by mouth daily.        . budesonide-formoterol (SYMBICORT) 80-4.5 MCG/ACT inhaler Inhale 2 puffs into the lungs 2 (two) times daily.       . diazepam (VALIUM) 5 MG tablet As needed       . levothyroxine (SYNTHROID, LEVOTHROID) 75 MCG tablet Take 100 mcg by mouth daily.       . metoprolol (LOPRESSOR) 50 MG tablet Take 50 mg by mouth 2 (two) times daily.        Marland Kitchen triamterene-hydrochlorothiazide (DYAZIDE) 37.5-25 MG per capsule Take 1 capsule by mouth daily.        . ziprasidone (GEODON) 40 MG capsule 1 tablet twice a day        Allergies  Allergen Reactions  . Lithium     Was  hospitalized  . Quad Tann (Pe-Ephed-Cpm-Carbetapentane) Other (See Comments)    Patient stated that she had muscles spasms from this medication.    History   Social History  . Marital Status: Married    Spouse Name: N/A    Number of Children: N/A  . Years of Education: N/A   Occupational History  . disabled    Social History Main Topics  . Smoking status: Former Smoker -- 1.0 packs/day for 43 years    Types: Cigarettes    Quit date: 08/21/2009  . Smokeless tobacco: Not on file  . Alcohol Use: No  . Drug Use: No  . Sexually Active: Not on file   Other Topics Concern  . Not on file   Social History Narrative  . No narrative on file    Family History  Problem Relation Age of Onset  . COPD Sister   . Asthma Maternal Uncle   . Lung cancer Cousin     Review of Systems:  As stated in the HPI and otherwise negative.   BP 122/74  Pulse 60  Ht 5\' 4"  (1.626 m)  Wt 265 lb 12.8 oz (120.566 kg)  BMI 45.62 kg/m2  Physical Examination: General: Well  developed, well nourished, NAD HEENT: OP clear, mucus membranes moist SKIN: warm, dry. No rashes. Neuro: No focal deficits Musculoskeletal: Muscle strength 5/5 all ext Psychiatric: Mood and affect normal Neck: No JVD, no carotid bruits, no thyromegaly, no lymphadenopathy. Lungs:Clear bilaterally, no wheezes, rhonci, crackles Cardiovascular: Regular rate and rhythm. No murmurs, gallops or rubs. Abdomen:Soft. Bowel sounds present. Non-tender.  Extremities: No lower extremity edema. Pulses are 2 + in the bilateral DP/PT.  Cardiac Cath 03/23/11:  1. The left main coronary artery had no evidence of disease.   2. The left anterior descending was a large vessel that coursed to the       apex and gave off several very small-caliber diagonal branches.       There was mild 20% plaque in the mid LAD.  There were no focally       obstructive lesions noted.   3. The circumflex artery gave off a moderate-sized obtuse marginal        branch.  The circumflex artery had no obstructive lesions noted.   4. The right coronary artery was a small-to-moderate-sized vessel that       was dominant.  There was mild 20% plaque in the mid vessel.  There       were no obstructive lesions noted.   5. Left ventricular angiogram was performed in the RAO projection and       showed overall ejection fraction of 55%.  There were no wall motion       abnormalities noted.

## 2011-04-07 NOTE — Patient Instructions (Signed)
Your physician recommends that you schedule a follow-up appointment in: 1 year  Fasting Labs in 12 weeks.

## 2011-04-10 ENCOUNTER — Telehealth: Payer: Self-pay | Admitting: Cardiovascular Disease

## 2011-04-10 NOTE — Telephone Encounter (Signed)
Patient calling C/O leg pain& cramps.  on samples of crestor.

## 2011-04-10 NOTE — Telephone Encounter (Signed)
Patient has experienced muscle cramps since starting Crestor. Advised her to stop taking it. Will discuss with Dr. Clifton James to see which antihyperlipidemic he would like to switch her to.

## 2011-04-11 ENCOUNTER — Encounter: Payer: Self-pay | Admitting: Pulmonary Disease

## 2011-04-11 ENCOUNTER — Telehealth: Payer: Self-pay | Admitting: Pulmonary Disease

## 2011-04-11 DIAGNOSIS — G4733 Obstructive sleep apnea (adult) (pediatric): Secondary | ICD-10-CM

## 2011-04-11 MED ORDER — ATORVASTATIN CALCIUM 10 MG PO TABS: 10.0000 mg | ORAL_TABLET | Freq: Every day | ORAL | Status: DC

## 2011-04-11 NOTE — Telephone Encounter (Signed)
Prescription called in for Atorvastatin 10 mg daily.

## 2011-04-11 NOTE — Telephone Encounter (Signed)
We can stop Crestor and try Lipitor 10 mg po once daily. (generic atorvastatin) thanks, chris

## 2011-04-11 NOTE — Procedures (Addendum)
[name], MASK               ACCOUNT NO.:  [id number]  MEDICAL RECORD NO.:  [id number]          PATIENT TYPE:  OUT  LOCATION:  SLEEP CENTER                 FACILITY[facility]  [facility]  PHYSICIAN:  Coralyn Helling, MD        DATE OF BIRTH:  1951-08-03  DATE OF STUDY:  03/28/2011                           NOCTURNAL POLYSOMNOGRAM  REFERRING PHYSICIAN:  Coralyn Helling, MD  INDICATION FOR STUDY:  [name] is a 60 year old female who has a history of hypertension.  She also has sleep disruption, snoring, and daytime sleepiness.  She is therefore referred to sleep lab for evaluation of hypersomnia and obstructive sleep apnea.  Height is 5 feet 4 inches, weight is 267 pounds.  BMI is 46.  Neck size is 17 inches.  EPWORTH SLEEPINESS SCORE:  12.  MEDICATIONS:  Maxzide, hydrochlorothiazide, metoprolol, levothyroxine, diazepam and Symbicort.  SLEEP ARCHITECTURE:  The patient followed a split night study protocol during the diagnostic portion of study.  Total recording time was 218 minutes.  Total sleep time was 152 minutes.  Sleep efficiency was 70%. Sleep latency was 26 minutes.  This portion of study was notable for lack of REM sleep and she slept exclusively in the non supine position.  During the titration portion of study, total recording time was 239 minutes.  Total sleep time was 94 minutes.  Sleep efficiency was 39%. Sleep latency was 31 minutes.  This portion of study was notable for lack of stage III sleep and REM sleep.  She slept in both the supine and non supine positions.  RESPIRATORY DATA:  The average respirations was 20.  Loud snoring was noted by the technician.  During the diagnostic portion of the study, the overall apnea-hypopnea index is 24.4.  The events were exclusively obstructive in nature.  During the titration portion of study, the patient was started on CPAP of 5 and increased to 8 cm of water.  Her apnea-hypopnea index was reduced to 0 with CPAP at 8 cm of water.   However, she did have episodes of snoring and in addition, she was not observed in either REM sleep or supine sleep at this pressure setting.  OXYGEN DATA:  The baseline oxygenation was 95%.  The oxygen saturation nadir was 91%.  The study was conducted without the use of supplemental oxygen.  CARDIAC DATA:  The average heart rate is 580 and the rhythm strip showed sinus rhythm with occasional PVCs.  MOVEMENT-PARASOMNIA:  The periodic limb movement index was 0 and the patient had one restroom trip.  IMPRESSIONS-RECOMMENDATIONS:  The study shows evidence for moderate obstructive sleep apnea with an apnea-hypopnea index of 24.4 and oxygen saturation nadir of 91%.  She had improvement in her sleep disordered breathing with CPAP at 8 cm water.  However, she was not observed in REM sleep or supine sleep and continued to have episodes of snoring.  She had difficulty with sleep maintenance after starting CPAP therapy.  In addition to diet, exercise, and weight reduction, I would recommend the patient be started on CPAP therapy for sleep disordered breathing. Options at this time could be to start her on an auto CPAP or alternatively have her  referred back to the sleep lab for a full night titration study.     Coralyn Helling, MD Diplomat, American Board of Sleep Medicine Electronically Signed    VS/MEDQ  D:  04/10/2011 18:52:33  T:  04/11/2011 02:46:51  Job:  [id number]

## 2011-04-11 NOTE — Telephone Encounter (Signed)
PSG 03/28/11>>AHI 24.4, SpO2 low 91%.  Moderate sleep apnea.  Suboptimal titration portion of study.  Results d/w pt over the phone.  Will proceed with auto CPAP (I have placed order with [facility]).  Will have my nurse schedule ROV in 2 months.

## 2011-04-12 ENCOUNTER — Telehealth: Payer: Self-pay | Admitting: Pulmonary Disease

## 2011-04-12 ENCOUNTER — Ambulatory Visit
Admission: RE | Admit: 2011-04-12 | Discharge: 2011-04-12 | Disposition: A | Discharge status: Home / Self Care | Payer: Medicare Other | Source: Ambulatory Visit | Attending: Pulmonary Disease | Admitting: Pulmonary Disease

## 2011-04-12 DIAGNOSIS — E039 Hypothyroidism, unspecified: Secondary | ICD-10-CM

## 2011-04-12 NOTE — Telephone Encounter (Signed)
Neck ultrasound 04/11/11>>normal appearing thyroid.  Will have my nurse inform pt that thyroid ultrasound was normal.

## 2011-04-13 ENCOUNTER — Encounter: Payer: Self-pay | Admitting: Pulmonary Disease

## 2011-04-13 NOTE — Telephone Encounter (Signed)
lmomtcb  

## 2011-04-13 NOTE — Telephone Encounter (Signed)
Spoke with pt and is aware thyroid ultrasound was normal. Pt verbalized understanding and had no questions

## 2011-04-18 ENCOUNTER — Other Ambulatory Visit: Payer: Self-pay | Admitting: Family Medicine

## 2011-04-18 ENCOUNTER — Ambulatory Visit (INDEPENDENT_AMBULATORY_CARE_PROVIDER_SITE_OTHER): Payer: Medicare Other | Admitting: Family Medicine

## 2011-04-18 ENCOUNTER — Encounter: Payer: Self-pay | Admitting: Family Medicine

## 2011-04-18 DIAGNOSIS — R0989 Other specified symptoms and signs involving the circulatory and respiratory systems: Secondary | ICD-10-CM

## 2011-04-18 DIAGNOSIS — Z1231 Encounter for screening mammogram for malignant neoplasm of breast: Secondary | ICD-10-CM

## 2011-04-18 DIAGNOSIS — R0609 Other forms of dyspnea: Secondary | ICD-10-CM

## 2011-04-18 DIAGNOSIS — R0600 Dyspnea, unspecified: Secondary | ICD-10-CM

## 2011-04-18 DIAGNOSIS — E039 Hypothyroidism, unspecified: Secondary | ICD-10-CM

## 2011-04-18 DIAGNOSIS — Z1211 Encounter for screening for malignant neoplasm of colon: Secondary | ICD-10-CM

## 2011-04-18 DIAGNOSIS — Z23 Encounter for immunization: Secondary | ICD-10-CM

## 2011-04-18 DIAGNOSIS — I2510 Atherosclerotic heart disease of native coronary artery without angina pectoris: Secondary | ICD-10-CM

## 2011-04-18 DIAGNOSIS — G4733 Obstructive sleep apnea (adult) (pediatric): Secondary | ICD-10-CM

## 2011-04-18 DIAGNOSIS — IMO0001 Reserved for inherently not codable concepts without codable children: Secondary | ICD-10-CM

## 2011-04-18 DIAGNOSIS — Z0000 Encounter for general adult medical examination without abnormal findings: Secondary | ICD-10-CM

## 2011-04-18 MED ORDER — GLUCOSE BLOOD VI STRP: ORAL_STRIP | Status: DC

## 2011-04-18 MED ORDER — ONETOUCH ULTRA SYSTEM W/DEVICE KIT: 1.0000 | PACK | Freq: Once | Status: DC

## 2011-04-18 MED ORDER — LEVOTHYROXINE SODIUM 100 MCG PO TABS: 100.0000 ug | ORAL_TABLET | Freq: Every day | ORAL | Status: DC

## 2011-04-18 MED ORDER — METFORMIN HCL 500 MG PO TABS: 500.0000 mg | ORAL_TABLET | Freq: Two times a day (BID) | ORAL | Status: DC

## 2011-04-18 NOTE — Progress Notes (Signed)
Subjective:    Patient ID: [name], female    DOB: 11-05-1950, 60 y.o.   MRN: [id number]  HPI CC: new pt, establish  Presents with husband today.    Previously saw Dr. Milinda Antis (10 yrs ago).  Then lost insurance and saw PCP locally (Dr. Vear Clock) who started pt on geodon (didn't do well with lithium, seroquel).  Takes for bipolar.  SOB - going on for 1 year as well, started after quit smoking.  Dyspnea at rest as well as at exhertion.  Last saw cards 1 wk ago.  Had catheterization, told 20% blockage.  Had echocardiogram as well, states normal.  Saw pulmonologist as well, told lung function fine.  Told has OSA, working on getting CPAP.  Never had dx diabetes, but has had elevated sugars in past.  A1c checked last month 8%.  Never been on meds for this.  Changed to splenda, endorses sugars running 100-400s.  Preventative: Tetanus unsure.  Would like today. Never had PNA shot. Never had colonoscopy.  Requests stool kit. No recent mammogram - would like [place] to set her up with one.  Previously went to breast center. No recent well woman exam - had hysterectomy 2004.  Last clinical breast exam 2006.  Medications and allergies reviewed and updated in chart.  Past histories reviewed and updated if relevant as below. Patient Active Problem List  Diagnoses  . Dyspnea  . Type II or unspecified type diabetes mellitus without mention of complication, uncontrolled  . OSA (obstructive sleep apnea)  . CAD (coronary artery disease)  . Hypothyroidism  . Chronic bronchitis   Past Medical History  Diagnosis Date  . Migraine   . GERD (gastroesophageal reflux disease)   . Anxiety   . Chronic bronchitis     mild COPD  . Hypertension   . Bipolar disorder   . Coronary artery disease, non-occlusive   . Hypothyroidism   . Fibromyalgia   . OSA (obstructive sleep apnea) 03/16/2011  . History of chicken pox   . HLD (hyperlipidemia)   . Urinary incontinence    Past Surgical History  Procedure  Date  . Total abdominal hysterectomy 2004  . Knee surgery 2006    left, torn meniscus  . Cardiac catherization 03/2011    x3  . Esophagogastroduodenoscopy 2006   History  Substance Use Topics  . Smoking status: Former Smoker -- 1.0 packs/day for 43 years    Types: Cigarettes    Quit date: 08/21/2009  . Smokeless tobacco: Never Used  . Alcohol Use: No   Family History  Problem Relation Age of Onset  . COPD Sister   . Asthma Maternal Uncle   . Cancer Maternal Uncle     colon  . Aneurysm Maternal Uncle   . Lung cancer Cousin   . Cancer Cousin     lung  . Hypertension Mother   . Stroke Mother   . Arthritis Mother   . Cancer Father     brain tumor  . Diabetes Father   . Thyroid disease Father   . Cancer Maternal Aunt 48    breast  . Cancer Paternal Uncle     prostate  . Coronary artery disease Neg Hx    Allergies  Allergen Reactions  . Lithium Other (See Comments)    abd pain, n/v, was hospitalized  . Quetiapine Other (See Comments)    Muscle twitching, muscle spasm   Current Outpatient Prescriptions on File Prior to Visit  Medication Sig Dispense Refill  .  aspirin 325 MG EC tablet Take 325 mg by mouth daily.        . budesonide-formoterol (SYMBICORT) 80-4.5 MCG/ACT inhaler Inhale 2 puffs into the lungs 2 (two) times daily.       . diazepam (VALIUM) 5 MG tablet As needed       . metoprolol (LOPRESSOR) 50 MG tablet Take 50 mg by mouth 2 (two) times daily.        Marland Kitchen triamterene-hydrochlorothiazide (DYAZIDE) 37.5-25 MG per capsule Take 1 capsule by mouth daily.        . ziprasidone (GEODON) 40 MG capsule 1 tablet twice a day      . atorvastatin (LIPITOR) 10 MG tablet Take 1 tablet (10 mg total) by mouth daily.  30 tablet  3   Review of Systems  Constitutional: Negative for fever, chills, activity change, appetite change, fatigue and unexpected weight change (weight gain).  HENT: Negative for hearing loss and neck pain.   Eyes: Negative for visual disturbance.    Respiratory: Positive for cough, chest tightness and shortness of breath. Negative for wheezing.   Cardiovascular: Positive for chest pain and leg swelling. Negative for palpitations.  Gastrointestinal: Positive for nausea and diarrhea. Negative for vomiting, abdominal pain, constipation, blood in stool and abdominal distention.  Genitourinary: Positive for dysuria. Negative for hematuria and difficulty urinating.  Musculoskeletal: Negative for myalgias and arthralgias.  Skin: Negative for rash.  Neurological: Positive for dizziness (1 year history of this) and headaches. Negative for seizures and syncope.  Hematological: Does not bruise/bleed easily.  Psychiatric/Behavioral: Positive for dysphoric mood and agitation. The patient is not nervous/anxious.        Objective:   Physical Exam  Nursing note and vitals reviewed. Constitutional: She is oriented to person, place, and time. She appears well-developed and well-nourished. No distress.       overweight  HENT:  Head: Normocephalic and atraumatic.  Right Ear: Hearing, tympanic membrane, external ear and ear canal normal.  Left Ear: Hearing, tympanic membrane, external ear and ear canal normal.  Nose: Nose normal. No mucosal edema or rhinorrhea. Right sinus exhibits no maxillary sinus tenderness and no frontal sinus tenderness. Left sinus exhibits no maxillary sinus tenderness and no frontal sinus tenderness.  Mouth/Throat: Uvula is midline, oropharynx is clear and moist and mucous membranes are normal. No oropharyngeal exudate, posterior oropharyngeal edema, posterior oropharyngeal erythema or tonsillar abscesses.       Dentures in  Eyes: Conjunctivae and EOM are normal. Pupils are equal, round, and reactive to light. No scleral icterus.  Neck: Normal range of motion. Neck supple. No thyromegaly present.  Cardiovascular: Normal rate, regular rhythm, normal heart sounds and intact distal pulses.   No murmur heard. Pulses:      Radial  pulses are 2+ on the right side, and 2+ on the left side.  Pulmonary/Chest: Effort normal and breath sounds normal. No respiratory distress. She has no wheezes. She has no rales.  Abdominal: Soft. Bowel sounds are normal. She exhibits no distension and no mass. There is no tenderness. There is no rebound and no guarding.  Musculoskeletal: Normal range of motion.  Lymphadenopathy:    She has no cervical adenopathy.  Neurological: She is alert and oriented to person, place, and time.       CN grossly intact, station and gait intact  Skin: Skin is warm and dry. No rash noted.  Psychiatric: She has a normal mood and affect. Her behavior is normal. Judgment and thought content normal.  Assessment & Plan:

## 2011-04-18 NOTE — Telephone Encounter (Signed)
lmomtcb to schedule ov in oct with VS

## 2011-04-18 NOTE — Assessment & Plan Note (Addendum)
Mild.  On ASA 325mg .  Started on lipitor 20mg  by cards.

## 2011-04-18 NOTE — Assessment & Plan Note (Addendum)
Dx DM as evidenced by A1c 8%, elevated sugars to 400s per pt. Start metformin, monitor sugars either fasting or 2 hours pp.  Discussed side effects to watch for. Sent in glucommeter. Return for f/u. Pt agrees.

## 2011-04-18 NOTE — Assessment & Plan Note (Signed)
Cardiac, pulm eval so far unrevealing.  Do anticipate significant component of dyspnea from obesity, deconditioning.  Encouraged to stay as active as possible, likely CPAP will help increase energy level during day.

## 2011-04-18 NOTE — Patient Instructions (Addendum)
Return at your convenience for welcome to medicare visit. Come back fasting for blood work in 2 wks, afterwards for physical. Td today.  Stool kit today. Pass by [place]'s office to set up mammogram at breast center. Start metformin 500mg  nightly for 1 wk then go up to 500mg  twice daily (with food).

## 2011-04-18 NOTE — Assessment & Plan Note (Signed)
Increased synthroid recently.  Recheck in 2 wks.

## 2011-04-18 NOTE — Assessment & Plan Note (Signed)
Encouraged pt to set up with CPAP.

## 2011-04-18 NOTE — Assessment & Plan Note (Signed)
Asked pt to schedule appt for welcome to medicare visit. Scheduled for mammogram, provided with stool kit today. Td and pneumonia shot today (h/o DM).

## 2011-04-19 NOTE — Telephone Encounter (Signed)
PT RETURNED CALL RE: RESULTS. [name]

## 2011-04-19 NOTE — Telephone Encounter (Signed)
Pt is coming 10/18 at 10:30 for f/u cpap

## 2011-05-02 ENCOUNTER — Telehealth: Payer: Self-pay | Admitting: *Deleted

## 2011-05-02 ENCOUNTER — Ambulatory Visit
Admission: RE | Admit: 2011-05-02 | Discharge: 2011-05-02 | Disposition: A | Discharge status: Home / Self Care | Payer: Medicare Other | Source: Ambulatory Visit | Attending: Family Medicine | Admitting: Family Medicine

## 2011-05-02 DIAGNOSIS — Z1231 Encounter for screening mammogram for malignant neoplasm of breast: Secondary | ICD-10-CM

## 2011-05-02 NOTE — Telephone Encounter (Signed)
Pt needs appointment to discuss. Thanks

## 2011-05-02 NOTE — Telephone Encounter (Signed)
Noted. Thanks.

## 2011-05-02 NOTE — Telephone Encounter (Signed)
Called pt to make appt with Dr Reece Agar to discuss breast problem and to set up diagnostic MMG at breast ctr. Patient is coming back in for CPX on 05/09/2011 and will discuss breast problem then and get the diagnostic order placed then.

## 2011-05-02 NOTE — Telephone Encounter (Signed)
Patient called and said she went for her screening mammogram today. She said they asked her if she had been having any problems and she said she had been having lymphnode issues. She said the lymphnodes on the left side of chest and under left arm are painful. Feels lump on left breast, but she has cystic breasts anyway. She didn't discuss this with you at her last visit. They said because of that, she would need a diagnostic mammogram instead. They will need a new order and she will need a new appt scheduled. They did not perform the screening test today.

## 2011-05-05 ENCOUNTER — Other Ambulatory Visit (INDEPENDENT_AMBULATORY_CARE_PROVIDER_SITE_OTHER): Payer: Medicare Other

## 2011-05-05 DIAGNOSIS — IMO0001 Reserved for inherently not codable concepts without codable children: Secondary | ICD-10-CM

## 2011-05-05 DIAGNOSIS — E785 Hyperlipidemia, unspecified: Secondary | ICD-10-CM

## 2011-05-05 DIAGNOSIS — E039 Hypothyroidism, unspecified: Secondary | ICD-10-CM

## 2011-05-05 LAB — CBC WITH DIFFERENTIAL/PLATELET
Basophils Absolute: 0.1 10*3/uL (ref 0.0–0.1)
Eosinophils Relative: 0 % (ref 0.0–5.0)
HCT: 39.8 % (ref 36.0–46.0)
Hemoglobin: 13.3 g/dL (ref 12.0–15.0)
Lymphocytes Relative: 28.2 % (ref 12.0–46.0)
Lymphs Abs: 3.7 10*3/uL (ref 0.7–4.0)
Monocytes Relative: 6.2 % (ref 3.0–12.0)
Neutro Abs: 8.5 10*3/uL — ABNORMAL HIGH (ref 1.4–7.7)
RBC: 4.2 Mil/uL (ref 3.87–5.11)
RDW: 14.8 % — ABNORMAL HIGH (ref 11.5–14.6)
WBC: 13 10*3/uL — ABNORMAL HIGH (ref 4.5–10.5)

## 2011-05-05 LAB — BASIC METABOLIC PANEL
Calcium: 9.1 mg/dL (ref 8.4–10.5)
GFR: 63.3 mL/min (ref >60.00)
Glucose, Bld: 121 mg/dL — ABNORMAL HIGH (ref 70–99)
Potassium: 3.7 mEq/L (ref 3.5–5.1)
Sodium: 140 mEq/L (ref 135–145)

## 2011-05-05 LAB — LIPID PANEL
Cholesterol: 188 mg/dL (ref 0–200)
HDL: 40.6 mg/dL (ref >39.00)
Triglycerides: 240 mg/dL — ABNORMAL HIGH (ref 0.0–149.0)
VLDL: 48 mg/dL — ABNORMAL HIGH (ref 0.0–40.0)

## 2011-05-05 LAB — HEPATIC FUNCTION PANEL
ALT: 28 U/L (ref 0–35)
Albumin: 3.7 g/dL (ref 3.5–5.2)
Bilirubin, Direct: 0 mg/dL (ref 0.0–0.3)
Total Protein: 7.7 g/dL (ref 6.0–8.3)

## 2011-05-05 LAB — TSH: TSH: 3.67 u[IU]/mL (ref 0.35–5.50)

## 2011-05-09 ENCOUNTER — Other Ambulatory Visit: Payer: Self-pay | Admitting: Family Medicine

## 2011-05-09 ENCOUNTER — Other Ambulatory Visit (HOSPITAL_COMMUNITY)
Admission: RE | Admit: 2011-05-09 | Discharge: 2011-05-09 | Disposition: A | Discharge status: Home / Self Care | Payer: Medicare Other | Source: Ambulatory Visit | Attending: Family Medicine | Admitting: Family Medicine

## 2011-05-09 ENCOUNTER — Ambulatory Visit (INDEPENDENT_AMBULATORY_CARE_PROVIDER_SITE_OTHER): Payer: Medicare Other | Admitting: Family Medicine

## 2011-05-09 ENCOUNTER — Encounter: Payer: Self-pay | Admitting: Family Medicine

## 2011-05-09 VITALS — BP 128/82 | HR 60 | Temp 98.0°F | Wt 269.5 lb

## 2011-05-09 DIAGNOSIS — G4733 Obstructive sleep apnea (adult) (pediatric): Secondary | ICD-10-CM

## 2011-05-09 DIAGNOSIS — N644 Mastodynia: Secondary | ICD-10-CM

## 2011-05-09 DIAGNOSIS — Z23 Encounter for immunization: Secondary | ICD-10-CM

## 2011-05-09 DIAGNOSIS — R309 Painful micturition, unspecified: Secondary | ICD-10-CM

## 2011-05-09 DIAGNOSIS — F319 Bipolar disorder, unspecified: Secondary | ICD-10-CM

## 2011-05-09 DIAGNOSIS — R300 Dysuria: Secondary | ICD-10-CM

## 2011-05-09 DIAGNOSIS — I10 Essential (primary) hypertension: Secondary | ICD-10-CM

## 2011-05-09 DIAGNOSIS — E785 Hyperlipidemia, unspecified: Secondary | ICD-10-CM

## 2011-05-09 DIAGNOSIS — Z1159 Encounter for screening for other viral diseases: Secondary | ICD-10-CM | POA: Insufficient documentation

## 2011-05-09 DIAGNOSIS — Z124 Encounter for screening for malignant neoplasm of cervix: Secondary | ICD-10-CM | POA: Insufficient documentation

## 2011-05-09 DIAGNOSIS — Z0000 Encounter for general adult medical examination without abnormal findings: Secondary | ICD-10-CM

## 2011-05-09 DIAGNOSIS — F419 Anxiety disorder, unspecified: Secondary | ICD-10-CM

## 2011-05-09 DIAGNOSIS — K219 Gastro-esophageal reflux disease without esophagitis: Secondary | ICD-10-CM

## 2011-05-09 DIAGNOSIS — B372 Candidiasis of skin and nail: Secondary | ICD-10-CM

## 2011-05-09 DIAGNOSIS — M797 Fibromyalgia: Secondary | ICD-10-CM

## 2011-05-09 LAB — POCT URINALYSIS DIPSTICK
Bilirubin, UA: NEGATIVE
Glucose, UA: NEGATIVE
Nitrite, UA: NEGATIVE
Spec Grav, UA: 1.01
Urobilinogen, UA: 0.2

## 2011-05-09 NOTE — Progress Notes (Signed)
Subjective:    Patient ID: [name], female    DOB: 03-22-1951, 60 y.o.   MRN: [id number]  HPI CC: welcome to medicare  Here for welcome to medicare visit. Requests flu shot and refill of maxzide. Known CAD, did not rpt EKG.  No chest pain. Pap today. Breast exam today. Has iFOB at home, has not turned in yet.  Brings log of sugars - started meformin and has had diarrhea but improving.  On 500mg  bid.  Sugars ranging fasting from 120-170, postprandial from 132-210.  No recent vision exam. Started CPAP for OSA, thinks tolerating ok. Stopped lipitor 2/2 anger flares.  Previously tried crestor but unable to tolerate 2/2 muscle pains. Lab Results  Component Value Date   CHOL 188 05/05/2011   Lab Results  Component Value Date   HDL 40.60 05/05/2011   Lab Results  Component Value Date   TRIG 240.0* 05/05/2011   Lab Results  Component Value Date   LDLDIRECT 111.2 05/05/2011   Endorses dysuria, polyuria, urgency, nocturia.  No flank pain or f/c/n/v.  States this has been going on for approx 1 year, has been told in past has UTI but denies ever receiving treatment for this.  EGD 2007 by doctor at [facility].    Medications and allergies reviewed and updated in chart.  Past histories reviewed and updated if relevant as below. Patient Active Problem List  Diagnoses  . Dyspnea  . Type II or unspecified type diabetes mellitus without mention of complication, uncontrolled  . OSA (obstructive sleep apnea)  . CAD (coronary artery disease)  . Hypothyroidism  . Chronic bronchitis  . Medicare welcome visit  . Painful urination  . [place] intertrigo  . Breast pain  . Hypertension  . Bipolar disorder  . Fibromyalgia  . HLD (hyperlipidemia)  . GERD (gastroesophageal reflux disease)  . Anxiety   Past Medical History  Diagnosis Date  . Migraine   . GERD (gastroesophageal reflux disease)   . Anxiety   . Chronic bronchitis     mild COPD  . Hypertension   . Bipolar  disorder   . Coronary artery disease, non-occlusive   . Hypothyroidism   . Fibromyalgia   . OSA (obstructive sleep apnea) 03/16/2011  . History of chicken pox   . HLD (hyperlipidemia)   . Urinary incontinence    Past Surgical History  Procedure Date  . Total abdominal hysterectomy 2004  . Knee surgery 2006    left, torn meniscus  . Cardiac catherization 03/2011    x3  . Esophagogastroduodenoscopy 2006   History  Substance Use Topics  . Smoking status: Former Smoker -- 1.0 packs/day for 43 years    Types: Cigarettes    Quit date: 08/21/2009  . Smokeless tobacco: Never Used  . Alcohol Use: No   Family History  Problem Relation Age of Onset  . COPD Sister   . Asthma Maternal Uncle   . Cancer Maternal Uncle     colon  . Aneurysm Maternal Uncle   . Lung cancer Cousin   . Cancer Cousin     lung  . Hypertension Mother   . Stroke Mother   . Arthritis Mother   . Cancer Father     brain tumor  . Diabetes Father   . Thyroid disease Father   . Cancer Maternal Aunt 48    breast  . Cancer Paternal Uncle     prostate  . Coronary artery disease Neg Hx    Allergies  Allergen Reactions  . Lithium Other (See Comments)    abd pain, n/v, was hospitalized  . Quetiapine Other (See Comments)    Muscle twitching, muscle spasm   Current Outpatient Prescriptions on File Prior to Visit  Medication Sig Dispense Refill  . aspirin 325 MG EC tablet Take 325 mg by mouth daily.        . Blood Glucose Monitoring Suppl (ONE TOUCH ULTRA SYSTEM KIT) W/DEVICE KIT 1 kit by Does not apply route once.  1 each  0  . budesonide-formoterol (SYMBICORT) 80-4.5 MCG/ACT inhaler Inhale 2 puffs into the lungs 2 (two) times daily.       Marland Kitchen glucose blood test strip Use as instructed.  Check bid.  Dx 250.02  100 each  12  . levothyroxine (SYNTHROID, LEVOTHROID) 100 MCG tablet Take 1 tablet (100 mcg total) by mouth daily.      . metFORMIN (GLUCOPHAGE) 500 MG tablet Take 1 tablet (500 mg total) by mouth 2 (two)  times daily with a meal.  60 tablet  3  . metoprolol (LOPRESSOR) 50 MG tablet Take 50 mg by mouth 2 (two) times daily.        . ziprasidone (GEODON) 40 MG capsule 1 tablet twice a day      . atorvastatin (LIPITOR) 10 MG tablet Take 1 tablet (10 mg total) by mouth daily.  30 tablet  3  . diazepam (VALIUM) 5 MG tablet As needed        Review of Systems  Constitutional: Negative for fever, chills, activity change, appetite change, fatigue and unexpected weight change (weight gain).  HENT: Negative for hearing loss and neck pain.   Eyes: Negative for visual disturbance.  Respiratory: Positive for cough and shortness of breath. Negative for chest tightness and wheezing.   Cardiovascular: Negative for chest pain, palpitations and leg swelling.  Gastrointestinal: Positive for nausea and diarrhea. Negative for vomiting, abdominal pain, constipation, blood in stool and abdominal distention.  Genitourinary: Positive for dysuria. Negative for hematuria and difficulty urinating.  Musculoskeletal: Negative for myalgias and arthralgias.  Skin: Negative for rash.  Neurological: Positive for dizziness (1 year history of this) and headaches. Negative for seizures and syncope.  Hematological: Does not bruise/bleed easily.  Psychiatric/Behavioral: Positive for dysphoric mood and agitation. The patient is not nervous/anxious.        Objective:   Physical Exam  Nursing note and vitals reviewed. Constitutional: She is oriented to person, place, and time. She appears well-developed and well-nourished. No distress.  HENT:  Head: Normocephalic and atraumatic.  Right Ear: External ear normal.  Left Ear: External ear normal.  Nose: Nose normal.  Mouth/Throat: Oropharynx is clear and moist.  Eyes: Conjunctivae and EOM are normal. Pupils are equal, round, and reactive to light.  Neck: Normal range of motion. Neck supple. No thyromegaly present.  Cardiovascular: Normal rate, regular rhythm, normal heart sounds  and intact distal pulses.   No murmur heard. Pulses:      Radial pulses are 2+ on the right side, and 2+ on the left side.  Pulmonary/Chest: Effort normal and breath sounds normal. No respiratory distress. She has no wheezes. She has no rales. Chest wall is not dull to percussion. She exhibits no mass, no tenderness, no bony tenderness, no laceration, no edema and no swelling. Right breast exhibits tenderness. Right breast exhibits no inverted nipple, no mass, no nipple discharge and no skin change. Left breast exhibits mass. Left breast exhibits no inverted nipple, no nipple discharge, no skin  change and no tenderness. Breasts are symmetrical.         Erythematous rash between breast and chest, pruritic, burning.  Abdominal: Soft. Bowel sounds are normal. She exhibits no distension and no mass. There is no tenderness. There is no rebound and no guarding.  Genitourinary: Vagina normal. There is breast swelling (left breast 4 oclock position firm lump) and tenderness (R 'outer breast diffusely tender). No breast discharge or bleeding. Pelvic exam was performed with patient supine. There is no rash on the right labia. There is no rash on the left labia. Cervix exhibits discharge. Cervix exhibits no motion tenderness and no friability. Right adnexum displays no mass, no tenderness and no fullness. Left adnexum displays no mass, no tenderness and no fullness. No signs of injury around the vagina. No vaginal discharge found.       Uterus absent.  Cervix remains  Musculoskeletal: Normal range of motion.  Lymphadenopathy:       Head (right side): No submental, no submandibular, no tonsillar, no preauricular, no posterior auricular and no occipital adenopathy present.       Head (left side): No submental, no submandibular, no tonsillar, no preauricular, no posterior auricular and no occipital adenopathy present.    She has no cervical adenopathy.    She has no axillary adenopathy (tender to palpation but no  LAD).       Right: No supraclavicular adenopathy present.       Left: No supraclavicular adenopathy present.  Neurological: She is alert and oriented to person, place, and time.       CN grossly intact, station and gait intact  Skin: Skin is warm and dry. No rash noted.  Psychiatric: She has a normal mood and affect. Her behavior is normal. Judgment and thought content normal.          Assessment & Plan:

## 2011-05-10 ENCOUNTER — Encounter: Payer: Self-pay | Admitting: Family Medicine

## 2011-05-10 ENCOUNTER — Other Ambulatory Visit: Payer: Self-pay | Admitting: Family Medicine

## 2011-05-10 DIAGNOSIS — F319 Bipolar disorder, unspecified: Secondary | ICD-10-CM | POA: Insufficient documentation

## 2011-05-10 DIAGNOSIS — R309 Painful micturition, unspecified: Secondary | ICD-10-CM | POA: Insufficient documentation

## 2011-05-10 DIAGNOSIS — E785 Hyperlipidemia, unspecified: Secondary | ICD-10-CM | POA: Insufficient documentation

## 2011-05-10 DIAGNOSIS — N644 Mastodynia: Secondary | ICD-10-CM | POA: Insufficient documentation

## 2011-05-10 DIAGNOSIS — K219 Gastro-esophageal reflux disease without esophagitis: Secondary | ICD-10-CM | POA: Insufficient documentation

## 2011-05-10 DIAGNOSIS — M797 Fibromyalgia: Secondary | ICD-10-CM | POA: Insufficient documentation

## 2011-05-10 DIAGNOSIS — E1169 Type 2 diabetes mellitus with other specified complication: Secondary | ICD-10-CM | POA: Insufficient documentation

## 2011-05-10 DIAGNOSIS — B372 Candidiasis of skin and nail: Secondary | ICD-10-CM | POA: Insufficient documentation

## 2011-05-10 DIAGNOSIS — F419 Anxiety disorder, unspecified: Secondary | ICD-10-CM | POA: Insufficient documentation

## 2011-05-10 DIAGNOSIS — I10 Essential (primary) hypertension: Secondary | ICD-10-CM | POA: Insufficient documentation

## 2011-05-10 MED ORDER — TRIAMTERENE-HCTZ 37.5-25 MG PO CAPS: 1.0000 | ORAL_CAPSULE | Freq: Every day | ORAL | Status: DC

## 2011-05-10 NOTE — Assessment & Plan Note (Addendum)
Off statin.   lipitor caused mood swings, crestor caused myalgias. Will need to discuss restarting at next visit.   Consider lipitor QOD or pravastatin high dose.

## 2011-05-10 NOTE — Assessment & Plan Note (Signed)
UA with TNTC WBC but also with several epithelial cells. Given sxs going on long term (several months at least), less likely infection. Await culture prior to treatment.

## 2011-05-10 NOTE — Assessment & Plan Note (Signed)
Reports compliance with CPAP.

## 2011-05-10 NOTE — Assessment & Plan Note (Signed)
No axillary LAD.  Left breast tender thruoghout exam.  Right breast with small mobile mass at 4 oclock position.  ?cyst.  Obtain diagnostic mammogram.

## 2011-05-10 NOTE — Assessment & Plan Note (Addendum)
I have personally reviewed the Medicare Annual Wellness questionnaire and have noted 1. The patient's medical and social history 2. Their use of alcohol, tobacco or illicit drugs 3. Their current medications and supplements 4. The patient's functional ability including ADL's, fall risks, home safety risks and hearing or visual             impairment. 5. Diet and physical activities 6. Evidence for depression or mood disorders  The patients weight, height, BMI and visual acuity have been recorded in the chart I have made referrals, counseling and provided education to the patient based review of the above and I have provided the pt with a written personalized care plan for preventive services.   Flu shot today. mammo set up today. Pt has iFOB at home, has not turned in yet. Pap perfomed today.

## 2011-05-10 NOTE — Assessment & Plan Note (Signed)
Clotrimazole.  discussed importance of maintaining area clean and dry, may use cotton or gauze between breast and chest wall.

## 2011-05-10 NOTE — Patient Instructions (Signed)
For urine - sent culture today. For breasts - pass [facility] to set up mammogram For skin rash - use clotrimazole cream twice daily.  Keep area clean and dry, between applications, with gauze between breast and skin. Flu shot today. Eye doctor appointment when you can Return in 1 month for diabetes follow up

## 2011-05-12 ENCOUNTER — Telehealth: Payer: Self-pay | Admitting: Family Medicine

## 2011-05-12 MED ORDER — CLOTRIMAZOLE 1 % EX CREA: TOPICAL_CREAM | Freq: Two times a day (BID) | CUTANEOUS | Status: DC

## 2011-05-12 MED ORDER — CIPROFLOXACIN HCL 250 MG PO TABS: 250.0000 mg | ORAL_TABLET | Freq: Two times a day (BID) | ORAL | Status: AC

## 2011-05-12 NOTE — Telephone Encounter (Signed)
Tried to reach patient, went to answering machine.  No ID.

## 2011-05-12 NOTE — Telephone Encounter (Signed)
Please notify preliminary urine showing possible small amt infection, would like to start cipro twice daily for 5 days, update [place] if sxs not improved after treatment.

## 2011-05-12 NOTE — Telephone Encounter (Signed)
Advised I've sent in script for clotrimazole (apparently forgot last visit) and sent in cipro twice daily for 5 days.

## 2011-05-13 LAB — URINE CULTURE: Colony Count: 10000

## 2011-05-15 ENCOUNTER — Telehealth: Payer: Self-pay | Admitting: *Deleted

## 2011-05-15 NOTE — Telephone Encounter (Signed)
I spoke with patient today and she said she got the cream, so either she paid out of pocket for the Rx or got it OTC. She is using it though per her confirmation.

## 2011-05-15 NOTE — Telephone Encounter (Signed)
Patient notified and will update if no improvement or if worsening symptoms.

## 2011-05-15 NOTE — Telephone Encounter (Signed)
Noted.  I do want her to try OTC clotrimazole first.

## 2011-05-15 NOTE — Telephone Encounter (Signed)
Clotrimazole 1% cream is not covered by pt's insurance because it's available OTC.  They will cover the solution or clotrimazole with betemethasone.  Prior Berkley Harvey is not available on this, per [employer].

## 2011-05-18 ENCOUNTER — Ambulatory Visit
Admission: RE | Admit: 2011-05-18 | Discharge: 2011-05-18 | Disposition: A | Discharge status: Home / Self Care | Payer: Medicare Other | Source: Ambulatory Visit | Attending: Family Medicine | Admitting: Family Medicine

## 2011-05-18 ENCOUNTER — Encounter: Payer: Self-pay | Admitting: *Deleted

## 2011-05-18 ENCOUNTER — Other Ambulatory Visit: Payer: Self-pay | Admitting: Family Medicine

## 2011-05-18 DIAGNOSIS — N644 Mastodynia: Secondary | ICD-10-CM

## 2011-05-19 ENCOUNTER — Telehealth: Payer: Self-pay | Admitting: Family Medicine

## 2011-05-19 DIAGNOSIS — N63 Unspecified lump in unspecified breast: Secondary | ICD-10-CM

## 2011-05-19 NOTE — Telephone Encounter (Signed)
Patient notified. She will have repeat done in 6 months.

## 2011-05-19 NOTE — Telephone Encounter (Signed)
Please notify mammogram and ultrasound returned looking ok, reassuring.  Likely right sided lymph node, recommended rpt mammogram in 6 month.  Placed order in chart.

## 2011-05-24 ENCOUNTER — Telehealth: Payer: Self-pay | Admitting: *Deleted

## 2011-05-24 DIAGNOSIS — R197 Diarrhea, unspecified: Secondary | ICD-10-CM

## 2011-05-24 NOTE — Telephone Encounter (Signed)
Have her ensure she is staying well hydrated with plenty of fluid.  Any abd pain, nausea, vomiting?  How many times a day?  Watery stool or soft formed? Any blood? Would like her to come in for stool test for cdiff given recent abx use (placed in chart). If she would like further evaluation, may make appt.

## 2011-05-24 NOTE — Telephone Encounter (Signed)
Noted. Thanks.  Will await stool test.  If worsening or becoming dehydrated, will need to be evaluated here.

## 2011-05-24 NOTE — Telephone Encounter (Signed)
Note continued from below.  Pt has had diarrhea for one week, started after she finished the cipro.  No nausea or fever,  Diarrhea is every day.  She is asking what to do about this.  Instructions on cipro said not to take anything to stop it, please advise.

## 2011-05-24 NOTE — Telephone Encounter (Signed)
Spoke with pt. She is drinking plenty of water.  No nausea or vomiting.  Has pain low down in her abd when the diarrhea starts.  She goes about 4-5 times a day, starts early morning but ends early evening.  Watery, some blood from irritation, but none in the stool.  She would like to come in for stool kit.

## 2011-05-24 NOTE — Telephone Encounter (Signed)
Pt states she finished cipro on Tuesday for UTI and she still has diarrhea.

## 2011-05-29 ENCOUNTER — Other Ambulatory Visit: Payer: Medicare Other

## 2011-05-29 DIAGNOSIS — R197 Diarrhea, unspecified: Secondary | ICD-10-CM

## 2011-06-07 ENCOUNTER — Encounter: Payer: Self-pay | Admitting: Pulmonary Disease

## 2011-06-08 ENCOUNTER — Encounter: Payer: Self-pay | Admitting: Pulmonary Disease

## 2011-06-08 ENCOUNTER — Ambulatory Visit (INDEPENDENT_AMBULATORY_CARE_PROVIDER_SITE_OTHER): Payer: Medicare Other | Admitting: Pulmonary Disease

## 2011-06-08 DIAGNOSIS — R0600 Dyspnea, unspecified: Secondary | ICD-10-CM

## 2011-06-08 DIAGNOSIS — G4733 Obstructive sleep apnea (adult) (pediatric): Secondary | ICD-10-CM

## 2011-06-08 DIAGNOSIS — K219 Gastro-esophageal reflux disease without esophagitis: Secondary | ICD-10-CM

## 2011-06-08 DIAGNOSIS — R0609 Other forms of dyspnea: Secondary | ICD-10-CM

## 2011-06-08 DIAGNOSIS — R0989 Other specified symptoms and signs involving the circulatory and respiratory systems: Secondary | ICD-10-CM

## 2011-06-08 NOTE — Assessment & Plan Note (Signed)
She is doing well with CPAP.  She is compliant and reports benefit from therapy.  Encouraged her to use her mask whenever she is asleep, including during naps.

## 2011-06-08 NOTE — Patient Instructions (Signed)
Follow up in 6 months 

## 2011-06-08 NOTE — Progress Notes (Signed)
Subjective:    Patient ID: [name], female    DOB: [birth year], 60 y.o.   MRN: [id number]  HPI  60 yo female former smoker with dyspnea related to obesity/deconditioning and sleep apnea.  She has been sleeping better, and feeling better since starting CPAP.  She still takes naps in the afternoon, but does not use her CPAP then.  She has trouble with reflux at night, and then has to take off her mask.    She stopped using symbicort, and did not notice any difference.  She still gets winded with exertion and after showering, but feels her breathing is better since starting CPAP.  Past Medical History  Diagnosis Date  . Migraine   . GERD (gastroesophageal reflux disease)   . Anxiety   . Chronic bronchitis     mild COPD  . Hypertension   . Bipolar disorder   . Coronary artery disease, non-occlusive   . Hypothyroidism   . Fibromyalgia   . OSA (obstructive sleep apnea) 03/16/2011  . History of chicken pox   . HLD (hyperlipidemia)   . Urinary incontinence      Family History  Problem Relation Age of Onset  . COPD Sister   . Asthma Maternal Uncle   . Cancer Maternal Uncle     colon  . Aneurysm Maternal Uncle   . Lung cancer Cousin   . Cancer Cousin     lung  . Hypertension Mother   . Stroke Mother   . Arthritis Mother   . Cancer Father     brain tumor  . Diabetes Father   . Thyroid disease Father   . Cancer Maternal Aunt 48    breast  . Cancer Paternal Uncle     prostate  . Coronary artery disease Neg Hx      History   Social History  . Marital Status: Married   Occupational History  . disabled Other   Social History Main Topics  . Smoking status: Former Smoker -- 1.0 packs/day for 43 years    Types: Cigarettes    Quit date: 08/21/2009  . Smokeless tobacco: Never Used  . Alcohol Use: No  . Drug Use: No    Social History Narrative   Caffeine: 2-3 coffee/am, 1 cup tea in afternoonLives with husband, 1 dog, 2 catsOccupation: disability since 2009 for  bipolar, previously worked at [employer]Activity: nothingDiet: not many fruits, good vegetables, good amt water     Allergies  Allergen Reactions  . Lithium Other (See Comments)    abd pain, n/v, was hospitalized  . Quetiapine Other (See Comments)    Muscle twitching, muscle spasm     Outpatient Prescriptions Prior to Visit  Medication Sig Dispense Refill  . aspirin 325 MG EC tablet Take 325 mg by mouth daily.        . Blood Glucose Monitoring Suppl (ONE TOUCH ULTRA SYSTEM KIT) W/DEVICE KIT 1 kit by Does not apply route once.  1 each  0  . clotrimazole (LOTRIMIN) 1 % cream Apply topically 2 (two) times daily.  30 g  0  . diazepam (VALIUM) 5 MG tablet As needed       . glucose blood test strip Use as instructed.  Check bid.  Dx 250.02  100 each  12  . levothyroxine (SYNTHROID, LEVOTHROID) 100 MCG tablet Take 1 tablet (100 mcg total) by mouth daily.      . metFORMIN (GLUCOPHAGE) 500 MG tablet Take 1 tablet (500 mg total) by mouth 2 (  two) times daily with a meal.  60 tablet  3  . metoprolol (LOPRESSOR) 50 MG tablet Take 50 mg by mouth 2 (two) times daily.        Marland Kitchen triamterene-hydrochlorothiazide (DYAZIDE) 37.5-25 MG per capsule Take 1 each (1 capsule total) by mouth daily.  90 capsule  3  . ziprasidone (GEODON) 40 MG capsule 1 tablet twice a day      . budesonide-formoterol (SYMBICORT) 80-4.5 MCG/ACT inhaler Inhale 2 puffs into the lungs 2 (two) times daily.        Review of Systems     Objective:   Physical Exam  BP 146/82  Pulse 61  Temp(Src) 97.9 F (36.6 C) (Oral)  Ht 5\' 4"  (1.626 m)  Wt 269 lb 3.2 oz (122.108 kg)  BMI 46.21 kg/m2  SpO2 95%  General - Obese  HEENT - no sinus tenderness, no oral lesions, mild fullness in thyroid region  Cardiac - s1s2 regular  Chest - decreased breath sounds, no wheeze  Abd - obese, soft, nontender  Ext - no edema  Neuro - normal strength, CN intact  Psych - normal mood, behavior  PSG 03/28/11>>AHI 24.4, SpO2 low 91% Auto CPAP 04/28/11  to 05/18/11>>Used on 20 of 21 nights with average 4 hrs 42 min.  Average AHI 0.3 with 95th percentile pressure 12 cm H2O.     Assessment & Plan:   OSA (obstructive sleep apnea) She is doing well with CPAP.  She is compliant and reports benefit from therapy.  Encouraged her to use her mask whenever she is asleep, including during naps.  GERD (gastroesophageal reflux disease) Advised her to d/w primary care.  Dyspnea Mostly related to obesity and deconditioning.    Updated Medication List Outpatient Encounter Prescriptions as of 06/08/2011  Medication Sig Dispense Refill  . aspirin 325 MG EC tablet Take 325 mg by mouth daily.        . Blood Glucose Monitoring Suppl (ONE TOUCH ULTRA SYSTEM KIT) W/DEVICE KIT 1 kit by Does not apply route once.  1 each  0  . clotrimazole (LOTRIMIN) 1 % cream Apply topically 2 (two) times daily.  30 g  0  . diazepam (VALIUM) 5 MG tablet As needed       . glucose blood test strip Use as instructed.  Check bid.  Dx 250.02  100 each  12  . levothyroxine (SYNTHROID, LEVOTHROID) 100 MCG tablet Take 1 tablet (100 mcg total) by mouth daily.      . metFORMIN (GLUCOPHAGE) 500 MG tablet Take 1 tablet (500 mg total) by mouth 2 (two) times daily with a meal.  60 tablet  3  . metoprolol (LOPRESSOR) 50 MG tablet Take 50 mg by mouth 2 (two) times daily.        Marland Kitchen triamterene-hydrochlorothiazide (DYAZIDE) 37.5-25 MG per capsule Take 1 each (1 capsule total) by mouth daily.  90 capsule  3  . ziprasidone (GEODON) 40 MG capsule 1 tablet twice a day      . DISCONTD: budesonide-formoterol (SYMBICORT) 80-4.5 MCG/ACT inhaler Inhale 2 puffs into the lungs 2 (two) times daily.

## 2011-06-08 NOTE — Assessment & Plan Note (Signed)
Advised her to d/w primary care.

## 2011-06-08 NOTE — Assessment & Plan Note (Signed)
Mostly related to obesity and deconditioning.

## 2011-06-12 ENCOUNTER — Ambulatory Visit (INDEPENDENT_AMBULATORY_CARE_PROVIDER_SITE_OTHER): Payer: Medicare Other | Admitting: Family Medicine

## 2011-06-12 ENCOUNTER — Encounter: Payer: Self-pay | Admitting: Family Medicine

## 2011-06-12 VITALS — BP 130/78 | HR 58 | Temp 98.0°F | Wt 265.5 lb

## 2011-06-12 DIAGNOSIS — E039 Hypothyroidism, unspecified: Secondary | ICD-10-CM

## 2011-06-12 DIAGNOSIS — IMO0001 Reserved for inherently not codable concepts without codable children: Secondary | ICD-10-CM

## 2011-06-12 DIAGNOSIS — F319 Bipolar disorder, unspecified: Secondary | ICD-10-CM

## 2011-06-12 DIAGNOSIS — E785 Hyperlipidemia, unspecified: Secondary | ICD-10-CM

## 2011-06-12 DIAGNOSIS — I2510 Atherosclerotic heart disease of native coronary artery without angina pectoris: Secondary | ICD-10-CM

## 2011-06-12 DIAGNOSIS — G4733 Obstructive sleep apnea (adult) (pediatric): Secondary | ICD-10-CM

## 2011-06-12 DIAGNOSIS — M79606 Pain in leg, unspecified: Secondary | ICD-10-CM | POA: Insufficient documentation

## 2011-06-12 DIAGNOSIS — M79609 Pain in unspecified limb: Secondary | ICD-10-CM

## 2011-06-12 DIAGNOSIS — I10 Essential (primary) hypertension: Secondary | ICD-10-CM

## 2011-06-12 MED ORDER — METOPROLOL TARTRATE 50 MG PO TABS: 50.0000 mg | ORAL_TABLET | Freq: Two times a day (BID) | ORAL | Status: DC

## 2011-06-12 MED ORDER — ZIPRASIDONE HCL 60 MG PO CAPS: 60.0000 mg | ORAL_CAPSULE | Freq: Two times a day (BID) | ORAL | Status: DC

## 2011-06-12 NOTE — Assessment & Plan Note (Deleted)
Elevated previously, in setting of UTI.  recheck today to ensure resolved.

## 2011-06-12 NOTE — Assessment & Plan Note (Signed)
Increase geodon to 60mg  bid.  Discussed weight gain, needs to watch sugars closely and work on diet - low carb diet recommended. rec set up with psych, we can refer if needed.

## 2011-06-12 NOTE — Assessment & Plan Note (Signed)
recheck A1c today. Refer to diabetes education. Discussed low cost option of [facility] vision screen for $60. Continue meds for now.

## 2011-06-12 NOTE — Assessment & Plan Note (Signed)
Not consistent with periph neuropathy, DVT or PAD although slightly diminished pulses on exam. If not improving, consider ABI next visit.

## 2011-06-12 NOTE — Patient Instructions (Addendum)
Return in 3 months for follow up. Blood work today.  We will check A1c. Increase geodon to 60mg  twice daily.  I do want you to establish with a psychiatrist as soon as you can to follow bipolar disorder.  [place] know if you need help finding one and we can refer you. Good to see you today.  Call us with questions.

## 2011-06-12 NOTE — Progress Notes (Signed)
  Subjective:    Patient ID: [name], female    DOB: Jan 31, 1951, 60 y.o.   MRN: [id number]  HPI CC: DM f/u  Diabetes - Dx 2012 although pt had been suspicious for last few years.  Taking metformin 500mg  bid.  No gi upset anymore.  On geodon.  Notes sugars very related to what food she is having.  ie waffle with syrup = cbg 195.  Otherwise 140s.  Notes pasta/rice increases sugars.  No paresthesias, chest pain/tightness, SOB.  Does have shooting cramping leg pain worse at night.  Worse last few months.  Stopped splenda and using equal.  Trouble decreasing pasta/rice 2/2 cost (cheapest thing available).  Has not been to diabetes education, would like this. No recent vision exam.  Last 4-5 yrs ago.  Trouble financially.    Lab Results  Component Value Date   HGBA1C 8.0* 03/15/2011   Bad diarrhea from ciprofloxacin.  Now better.  Notes eggs causing diarrhea.  Dyspnea - improved on CPAP.  8 hours at night, also using during naps during day.  Bipolar - worsening recently, noticing some manic sxs, at times tearing.  Would like to increase geodon.  Not currently followed by psych.  Review of Systems Per HPI    Objective:   Physical Exam  Nursing note and vitals reviewed. Constitutional: She appears well-developed and well-nourished. No distress.       obese  HENT:  Head: Normocephalic and atraumatic.  Right Ear: External ear normal.  Left Ear: External ear normal.  Nose: Nose normal.  Mouth/Throat: Oropharynx is clear and moist. No oropharyngeal exudate.  Eyes: Conjunctivae and EOM are normal. Pupils are equal, round, and reactive to light. No scleral icterus.  Neck: Normal range of motion. Neck supple.  Cardiovascular: Normal rate, regular rhythm, normal heart sounds and intact distal pulses.   No murmur heard. Pulses:      Dorsalis pedis pulses are 1+ on the right side, and 1+ on the left side.  Pulmonary/Chest: Effort normal and breath sounds normal. No respiratory distress.  She has no wheezes. She has no rales.  Musculoskeletal: She exhibits no edema.       Diabetic foot exam: Normal inspection No skin breakdown No calluses  Diminished DP/PT pulses bilaterally Normal sensation to light tough and monofilament Nails normal  Neg homan sign.  Lymphadenopathy:    She has no cervical adenopathy.  Skin: Skin is warm and dry. No rash noted.  Psychiatric: She has a normal mood and affect.      Assessment & Plan:

## 2011-06-14 ENCOUNTER — Telehealth: Payer: Self-pay | Admitting: Radiology

## 2011-06-14 ENCOUNTER — Encounter: Payer: Self-pay | Admitting: Pulmonary Disease

## 2011-06-14 NOTE — Telephone Encounter (Signed)
[facility] notified us that this patient never returned the ifob stool kit. [facility] will bill them $5.27 for the kit. 

## 2011-06-27 ENCOUNTER — Other Ambulatory Visit: Payer: Medicare Other | Admitting: *Deleted

## 2011-07-10 ENCOUNTER — Ambulatory Visit: Payer: Medicare Other | Admitting: Family Medicine

## 2011-07-17 ENCOUNTER — Encounter: Payer: Self-pay | Admitting: Family Medicine

## 2011-07-22 ENCOUNTER — Ambulatory Visit: Payer: Medicare Other | Admitting: Family Medicine

## 2011-08-01 ENCOUNTER — Ambulatory Visit: Payer: Medicare Other | Admitting: Pulmonary Disease

## 2011-08-14 ENCOUNTER — Other Ambulatory Visit: Payer: Self-pay | Admitting: Family Medicine

## 2011-08-22 ENCOUNTER — Ambulatory Visit: Payer: Self-pay | Admitting: Family Medicine

## 2011-09-12 ENCOUNTER — Encounter: Payer: Self-pay | Admitting: Family Medicine

## 2011-09-12 ENCOUNTER — Ambulatory Visit (INDEPENDENT_AMBULATORY_CARE_PROVIDER_SITE_OTHER): Payer: Medicare Other | Admitting: Family Medicine

## 2011-09-12 DIAGNOSIS — R5383 Other fatigue: Secondary | ICD-10-CM

## 2011-09-12 DIAGNOSIS — F319 Bipolar disorder, unspecified: Secondary | ICD-10-CM

## 2011-09-12 DIAGNOSIS — E039 Hypothyroidism, unspecified: Secondary | ICD-10-CM

## 2011-09-12 DIAGNOSIS — R5381 Other malaise: Secondary | ICD-10-CM

## 2011-09-12 DIAGNOSIS — I10 Essential (primary) hypertension: Secondary | ICD-10-CM

## 2011-09-12 DIAGNOSIS — IMO0001 Reserved for inherently not codable concepts without codable children: Secondary | ICD-10-CM

## 2011-09-12 LAB — BASIC METABOLIC PANEL
BUN: 13 mg/dL (ref 6–23)
Chloride: 101 mEq/L (ref 96–112)
Creatinine, Ser: 0.8 mg/dL (ref 0.4–1.2)
Glucose, Bld: 85 mg/dL (ref 70–99)
Potassium: 4 mEq/L (ref 3.5–5.1)

## 2011-09-12 LAB — CBC WITH DIFFERENTIAL/PLATELET
Basophils Relative: 0.3 % (ref 0.0–3.0)
Eosinophils Relative: 0 % (ref 0.0–5.0)
HCT: 39.4 % (ref 36.0–46.0)
Hemoglobin: 13.4 g/dL (ref 12.0–15.0)
Lymphs Abs: 3.6 10*3/uL (ref 0.7–4.0)
MCHC: 33.9 g/dL (ref 30.0–36.0)
MCV: 95.4 fl (ref 78.0–100.0)
Monocytes Absolute: 1 10*3/uL (ref 0.1–1.0)
Neutro Abs: 11.2 10*3/uL — ABNORMAL HIGH (ref 1.4–7.7)
RBC: 4.13 Mil/uL (ref 3.87–5.11)
WBC: 15.9 10*3/uL — ABNORMAL HIGH (ref 4.5–10.5)

## 2011-09-12 MED ORDER — ZIPRASIDONE HCL 40 MG PO CAPS: 40.0000 mg | ORAL_CAPSULE | Freq: Two times a day (BID) | ORAL | Status: DC

## 2011-09-12 MED ORDER — LEVOTHYROXINE SODIUM 100 MCG PO TABS: 100.0000 ug | ORAL_TABLET | Freq: Every day | ORAL | Status: DC

## 2011-09-12 NOTE — Patient Instructions (Signed)
Blood work today.  We will call you with results and titrate medicines accordingly. We will check to see how much b12 blood test costs. Return in 3 months for follow up.

## 2011-09-12 NOTE — Progress Notes (Signed)
  Subjective:    Patient ID: [name], female    DOB: April 13, 1951, 61 y.o.   MRN: [id number]  HPI CC: 3 mo f/u  DM - brings log - fasting sugars ranging 120-140s, pm sugars 180-200s.  Checks bid.  No lows.  Due for vision screen (last 5 yrs ago).  Has had blurry vision recently.  Working on finances to be able to get checked.  Foot exam last visit.  Completed DME. Metformin bid caused too much diarrhea.  Backed down to once daily and improved.   Lab Results  Component Value Date   HGBA1C 6.9* 06/12/2011   HTN - good control on current regimen, tolerating meds well.  Changed from geodon 60mg  bid (caused leg twitching) to 60mg  daily.  Feels mood stable.  TSH - last checked 1+ mo ago, would like recheck for new synthroid script.  Staying fatigued.  Uses CPAP 9-10 hours/day, staying fatigued - weak and tired all day long despite good sleeping.  Review of Systems Per HPI    Objective:   Physical Exam  Constitutional: She appears well-developed and well-nourished. No distress.  HENT:  Head: Normocephalic and atraumatic.  Mouth/Throat: Oropharynx is clear and moist. No oropharyngeal exudate.  Eyes: Conjunctivae and EOM are normal. Pupils are equal, round, and reactive to light. No scleral icterus.  Neck: Normal range of motion. Neck supple. Carotid bruit is not present. No thyromegaly present.  Cardiovascular: Normal rate, regular rhythm, normal heart sounds and intact distal pulses.   No murmur heard. Pulmonary/Chest: Effort normal and breath sounds normal. No respiratory distress. She has no wheezes. She has no rales.  Musculoskeletal: She exhibits no edema.  Lymphadenopathy:    She has no cervical adenopathy.  Skin: Skin is warm and dry. No rash noted.  Psychiatric: She has a normal mood and affect.       Assessment & Plan:

## 2011-09-13 DIAGNOSIS — R5383 Other fatigue: Secondary | ICD-10-CM | POA: Insufficient documentation

## 2011-09-13 NOTE — Assessment & Plan Note (Signed)
Chronic, stable. Continue regimen. 

## 2011-09-13 NOTE — Assessment & Plan Note (Signed)
Blood work for reversible causes. Medicare does not cover B12 for this dx. Advised pt start taking oral B12 daily.

## 2011-09-13 NOTE — Assessment & Plan Note (Signed)
Chronic, stable. Check A1c again today. If elevated, discussed change to metformin XR to see if better tolerated.  Alternative is starting SU. Foot exam last visit.

## 2011-09-13 NOTE — Assessment & Plan Note (Signed)
Check TSH again today, refilled synthroid.

## 2011-09-13 NOTE — Assessment & Plan Note (Signed)
Has not seen psych recently. Did not tolerate increase in geodon, will change back to 40mg  bid. Working on finding psych that will take medicare (prior doesn't)

## 2011-09-22 ENCOUNTER — Ambulatory Visit: Payer: Self-pay | Admitting: Family Medicine

## 2011-10-06 ENCOUNTER — Other Ambulatory Visit: Payer: Self-pay | Admitting: Family Medicine

## 2011-10-06 DIAGNOSIS — N63 Unspecified lump in unspecified breast: Secondary | ICD-10-CM

## 2011-10-30 ENCOUNTER — Ambulatory Visit
Admission: RE | Admit: 2011-10-30 | Discharge: 2011-10-30 | Disposition: A | Discharge status: Home / Self Care | Payer: Medicare Other | Source: Ambulatory Visit | Attending: Family Medicine | Admitting: Family Medicine

## 2011-10-30 DIAGNOSIS — N63 Unspecified lump in unspecified breast: Secondary | ICD-10-CM

## 2011-10-31 ENCOUNTER — Encounter: Payer: Self-pay | Admitting: *Deleted

## 2011-12-04 ENCOUNTER — Ambulatory Visit (INDEPENDENT_AMBULATORY_CARE_PROVIDER_SITE_OTHER): Payer: Medicare Other | Admitting: Pulmonary Disease

## 2011-12-04 ENCOUNTER — Encounter: Payer: Self-pay | Admitting: Pulmonary Disease

## 2011-12-04 VITALS — BP 140/72 | HR 59 | Temp 97.9°F | Ht 64.5 in | Wt 275.8 lb

## 2011-12-04 DIAGNOSIS — R0600 Dyspnea, unspecified: Secondary | ICD-10-CM

## 2011-12-04 DIAGNOSIS — G4733 Obstructive sleep apnea (adult) (pediatric): Secondary | ICD-10-CM

## 2011-12-04 DIAGNOSIS — R0989 Other specified symptoms and signs involving the circulatory and respiratory systems: Secondary | ICD-10-CM

## 2011-12-04 DIAGNOSIS — R0609 Other forms of dyspnea: Secondary | ICD-10-CM

## 2011-12-04 NOTE — Assessment & Plan Note (Signed)
She is compliant and reports benefit from therapy. 

## 2011-12-04 NOTE — Assessment & Plan Note (Signed)
Mostly related to obesity and deconditioning.  Encouraged her to stick with her diet/exercise program.  Advised that additional pulmonary testing is not needed at this time.

## 2011-12-04 NOTE — Progress Notes (Signed)
Chief Complaint  Patient presents with  . Follow-up    Pt c/o SOB w/ exertion, some wheezing today. denies any cough  . Sleep Apnea    Pt states she wears her cpap machine everynight x 8-9 hrs a night. Pt needs new tubing for machine    History of Present Illness: [name] is a 61 y.o. female former smoker with sleep apnea on CPAP and dyspnea related to obesity/deconditioning.  She has been doing well with CPAP.  She feels this helps her sleep and energy.  She is now using [facility] for her DME.  Her breathing is the same.  She does not do any activity due to knee pain.   Past Medical History  Diagnosis Date  . Migraine   . GERD (gastroesophageal reflux disease)   . Anxiety   . Chronic bronchitis     mild COPD  . Hypertension   . Bipolar disorder   . Coronary artery disease, non-occlusive   . Hypothyroidism   . Fibromyalgia   . OSA (obstructive sleep apnea) 03/16/2011  . History of chicken pox   . HLD (hyperlipidemia)   . Urinary incontinence   . T2DM (type 2 diabetes mellitus) 2012    DM education 06/2011    Past Surgical History  Procedure Date  . Total abdominal hysterectomy 2004  . Knee surgery 2006    left, torn meniscus  . Cardiac catherization 03/2011    x3  . Esophagogastroduodenoscopy 2006    Allergies  Allergen Reactions  . Lithium Other (See Comments)    abd pain, n/v, was hospitalized  . Quetiapine Other (See Comments)    Muscle twitching, muscle spasm    Physical Exam:  Blood pressure 140/72, pulse 59, temperature 97.9 F (36.6 C), temperature source Oral, height 5' 4.5" (1.638 m), weight 275 lb 12.8 oz (125.102 kg), SpO2 97.00%. Body mass index is 46.61 kg/(m^2). Wt Readings from Last 2 Encounters:  12/04/11 275 lb 12.8 oz (125.102 kg)  09/12/11 265 lb 4 oz (120.317 kg)    General - Obese  HEENT - no sinus tenderness, no oral lesions, mild fullness in thyroid region  Cardiac - s1s2 regular  Chest - decreased breath sounds, no wheeze  Abd  - obese, soft, nontender  Ext - no edema  Neuro - normal strength, CN intact  Psych - normal mood, behavior hiatric -   Assessment/Plan:  Outpatient Encounter Prescriptions as of 12/04/2011  Medication Sig Dispense Refill  . aspirin 81 MG tablet Take 160 mg by mouth daily.      . Blood Glucose Monitoring Suppl (ONE TOUCH ULTRA SYSTEM KIT) W/DEVICE KIT 1 kit by Does not apply route once.  1 each  0  . Cimetidine (ACID REDUCER PO) Take 1 tablet by mouth daily.      . clotrimazole (LOTRIMIN) 1 % cream Apply topically 2 (two) times daily as needed.      . diazepam (VALIUM) 5 MG tablet As needed       . glucose blood test strip Use as instructed.  Check bid.  Dx 250.02  100 each  12  . levothyroxine (SYNTHROID, LEVOTHROID) 100 MCG tablet Take 1 tablet (100 mcg total) by mouth daily.  90 tablet  3  . metFORMIN (GLUCOPHAGE) 500 MG tablet Take 500 mg by mouth daily after supper.       . metoprolol (LOPRESSOR) 50 MG tablet Take 1 tablet (50 mg total) by mouth 2 (two) times daily.  180 tablet  3  .  triamterene-hydrochlorothiazide (DYAZIDE) 37.5-25 MG per capsule Take 1 each (1 capsule total) by mouth daily.  90 capsule  3  . ziprasidone (GEODON) 40 MG capsule Take 1 capsule (40 mg total) by mouth 2 (two) times daily with a meal.  60 capsule  11  . DISCONTD: clotrimazole (LOTRIMIN) 1 % cream Apply topically 2 (two) times daily.  30 g  0    [name] Pager:  [phone] 12/04/2011, 10:46 AM

## 2011-12-04 NOTE — Patient Instructions (Signed)
Follow up in 6 months 

## 2011-12-11 ENCOUNTER — Encounter: Payer: Self-pay | Admitting: Family Medicine

## 2011-12-11 ENCOUNTER — Ambulatory Visit (INDEPENDENT_AMBULATORY_CARE_PROVIDER_SITE_OTHER): Payer: Medicare Other | Admitting: Family Medicine

## 2011-12-11 VITALS — BP 124/70 | HR 64 | Temp 97.1°F | Ht 63.75 in | Wt 275.2 lb

## 2011-12-11 DIAGNOSIS — I10 Essential (primary) hypertension: Secondary | ICD-10-CM

## 2011-12-11 DIAGNOSIS — R0600 Dyspnea, unspecified: Secondary | ICD-10-CM

## 2011-12-11 DIAGNOSIS — F319 Bipolar disorder, unspecified: Secondary | ICD-10-CM

## 2011-12-11 DIAGNOSIS — M25562 Pain in left knee: Secondary | ICD-10-CM

## 2011-12-11 DIAGNOSIS — M1712 Unilateral primary osteoarthritis, left knee: Secondary | ICD-10-CM | POA: Insufficient documentation

## 2011-12-11 DIAGNOSIS — R0989 Other specified symptoms and signs involving the circulatory and respiratory systems: Secondary | ICD-10-CM

## 2011-12-11 DIAGNOSIS — R0609 Other forms of dyspnea: Secondary | ICD-10-CM

## 2011-12-11 DIAGNOSIS — M25569 Pain in unspecified knee: Secondary | ICD-10-CM

## 2011-12-11 DIAGNOSIS — E039 Hypothyroidism, unspecified: Secondary | ICD-10-CM

## 2011-12-11 DIAGNOSIS — IMO0001 Reserved for inherently not codable concepts without codable children: Secondary | ICD-10-CM

## 2011-12-11 MED ORDER — METFORMIN HCL ER 750 MG PO TB24: 750.0000 mg | ORAL_TABLET | Freq: Every day | ORAL | Status: DC

## 2011-12-11 NOTE — Progress Notes (Signed)
Subjective:    Patient ID: [name], female    DOB: [birth year], 61 y.o.   MRN: [id number]  HPI CC: 3 mo f/u  DM - didn't bring log today - checks sugars bid.  No lows.  Has been having elevated sugars last few weeks, up to 400s after pancakes.  Due for vision screen (last 5 yrs ago).  Working on finances to be able to get checked.  Foot exam today.  Completed DSME 2012.  Metformin bid caused too much diarrhea. Backed down to once daily and improved.  HTN - tolerating meds well.   bp elevated at home to 160/100s per pt but in office good control.  Wt Readings from Last 3 Encounters:  12/11/11 275 lb 4 oz (124.853 kg)  12/04/11 275 lb 12.8 oz (125.102 kg)  09/12/11 265 lb 4 oz (120.317 kg)  wt up 10 lbs since last visit here.    Bipolar - on 40mg  bid.  Stable.    TSH - good control last check at daily.  OSA - eval by Dr. Craige Cotta with OSA but not thought to have pulmonary cause of dyspnea.  Uses CPAP 9-10 hours/day, good response to treatment with energy level.  L knee pain - s/p surgery for torn meniscus 2006, never completed rehab.  Since then constant pain with her anterior knee.  Some in back as well.  Locking and popping and instability.  H/o fall down 3 steps, may have twisted knee, landed on cement.  This happened 6 mo ago.  Knee pain hasn't changed with this episode.  Never returned iFOB given 04/2011.  Requests repeat  Past Medical History  Diagnosis Date  . Migraine   . GERD (gastroesophageal reflux disease)   . Anxiety   . Chronic bronchitis     mild COPD  . Hypertension   . Bipolar disorder   . Coronary artery disease, non-occlusive   . Hypothyroidism   . Fibromyalgia   . OSA (obstructive sleep apnea) 03/16/2011  . History of chicken pox   . HLD (hyperlipidemia)   . Urinary incontinence   . T2DM (type 2 diabetes mellitus) 2012    DM education 06/2011   Past Surgical History  Procedure Date  . Total abdominal hysterectomy 2004  . Knee surgery 2006   left, torn meniscus  . Cardiac catherization 03/2011    x3  . Esophagogastroduodenoscopy 2006    Review of Systems Per HPI    Objective:   Physical Exam  Nursing note and vitals reviewed. Constitutional: She appears well-developed and well-nourished. No distress.       Morbidly obese  HENT:  Head: Normocephalic and atraumatic.  Right Ear: External ear normal.  Left Ear: External ear normal.  Nose: Nose normal.  Mouth/Throat: Oropharynx is clear and moist. No oropharyngeal exudate.  Eyes: Conjunctivae and EOM are normal. Pupils are equal, round, and reactive to light. No scleral icterus.  Neck: Normal range of motion. Neck supple.  Cardiovascular: Normal rate, regular rhythm, normal heart sounds and intact distal pulses.   No murmur heard. Pulmonary/Chest: Effort normal and breath sounds normal. No respiratory distress. She has no wheezes. She has no rales.  Musculoskeletal: She exhibits edema (mild nonpitting edema).       Diabetic foot exam: Normal inspection No skin breakdown No calluses  Slight decreased DP/PT pulses bilaterally Normal sensation to light touch and monofilament Nails normal  R knee: WNL L knee: crepitus with flexion/extension at knee, slight swelling suprapatellar area, tender medial and  lateral joint lines.  neg drawer, + pain with mcmurray's, no PFgrind.  No abnormal patellar mobility.  Lymphadenopathy:    She has no cervical adenopathy.  Skin: Skin is warm and dry. No rash noted.  Psychiatric: She has a normal mood and affect.       Assessment & Plan:

## 2011-12-11 NOTE — Assessment & Plan Note (Signed)
overall unrevealing workup points to obesity and deconditioning as causes of dyspnea. encouraged continued weight loss.

## 2011-12-11 NOTE — Assessment & Plan Note (Signed)
In h/o meniscal injury, concern for same. Pt will see if can get in to see ortho, if needs referral will notify us.

## 2011-12-11 NOTE — Assessment & Plan Note (Signed)
Stable, continue geodon 40mg  bid. Has not established with psych.

## 2011-12-11 NOTE — Assessment & Plan Note (Signed)
Return in 3 mo for f/u. Deteriorated control - did not tolerate increased metformin - will start longacting. recheck when returns for next appt. Foot exam today. encouraged set up vision evaluation.

## 2011-12-11 NOTE — Assessment & Plan Note (Signed)
Lab Results  Component Value Date   TSH 2.27 09/12/2011

## 2011-12-11 NOTE — Patient Instructions (Addendum)
iFOB today. Change metformin to Metformin XR 850mg  daily. Return in 3 months for follow up. Call us with questions.

## 2011-12-11 NOTE — Assessment & Plan Note (Signed)
Chronic.  In office well controlled however pt reports poor control at home. rec bring cuff to next appt.

## 2011-12-19 ENCOUNTER — Other Ambulatory Visit: Payer: Self-pay | Admitting: Family Medicine

## 2011-12-19 ENCOUNTER — Other Ambulatory Visit: Payer: Medicare Other

## 2011-12-19 DIAGNOSIS — Z1211 Encounter for screening for malignant neoplasm of colon: Secondary | ICD-10-CM

## 2011-12-19 DIAGNOSIS — R195 Other fecal abnormalities: Secondary | ICD-10-CM

## 2011-12-20 ENCOUNTER — Encounter: Payer: Self-pay | Admitting: Gastroenterology

## 2011-12-20 HISTORY — PX: COLONOSCOPY: SHX174

## 2011-12-27 ENCOUNTER — Ambulatory Visit (AMBULATORY_SURGERY_CENTER): Payer: Medicare Other | Admitting: *Deleted

## 2011-12-27 VITALS — Ht 63.5 in | Wt 275.7 lb

## 2011-12-27 DIAGNOSIS — K921 Melena: Secondary | ICD-10-CM

## 2011-12-27 MED ORDER — PEG-KCL-NACL-NASULF-NA ASC-C 100 G PO SOLR: ORAL | Status: DC

## 2012-01-10 ENCOUNTER — Encounter: Payer: Self-pay | Admitting: Gastroenterology

## 2012-01-10 ENCOUNTER — Ambulatory Visit (AMBULATORY_SURGERY_CENTER): Payer: Medicare Other | Admitting: Gastroenterology

## 2012-01-10 VITALS — BP 157/112 | HR 87 | Temp 97.2°F | Resp 20 | Ht 63.0 in | Wt 275.0 lb

## 2012-01-10 DIAGNOSIS — R195 Other fecal abnormalities: Secondary | ICD-10-CM

## 2012-01-10 DIAGNOSIS — D126 Benign neoplasm of colon, unspecified: Secondary | ICD-10-CM

## 2012-01-10 DIAGNOSIS — K921 Melena: Secondary | ICD-10-CM

## 2012-01-10 LAB — GLUCOSE, CAPILLARY
Glucose-Capillary: 152 mg/dL — ABNORMAL HIGH (ref 70–99)
Glucose-Capillary: 121 mg/dL — ABNORMAL HIGH (ref 70–99)

## 2012-01-10 MED ORDER — SODIUM CHLORIDE 0.9 % IV SOLN: 500.0000 mL | INTRAVENOUS | Status: DC

## 2012-01-10 NOTE — Progress Notes (Signed)
Patient did not experience any of the following events: a burn prior to discharge; a fall within the facility; wrong site/side/patient/procedure/implant event; or a hospital transfer or hospital admission upon discharge from the facility. (G8907) Patient did not have preoperative order for IV antibiotic SSI prophylaxis. (G8918)  

## 2012-01-10 NOTE — Op Note (Signed)
[facility] [address] [place], [place]  [address]  COLONOSCOPY PROCEDURE REPORT PATIENT:  [name], [name]  MR#:  [id number] BIRTHDATE:  09/06/1950, 61 yrs. old  GENDER:  female ENDOSCOPIST:  Judie Petit T. Russella Dar, MD, [facility] Referred by:  Eustaquio Boyden, M.D. PROCEDURE DATE:  01/10/2012 PROCEDURE:  Colonoscopy with biopsy and snare polypectomy ASA CLASS:  Class II INDICATIONS:  1) heme positive stool MEDICATIONS:   MAC sedation, administered by CRNA, propofol (Diprivan) 350 mg IV DESCRIPTION OF PROCEDURE:   After the risks benefits and alternatives of the procedure were thoroughly explained, informed consent was obtained.  Digital rectal exam was performed and revealed no abnormalities.   The LB [id number] endoscope was introduced through the anus and advanced to the cecum, which was identified by both the appendix and ileocecal valve, without limitations.  The quality of the prep was good, using MoviPrep. The instrument was then slowly withdrawn as the colon was fully examined. <<PROCEDUREIMAGES>> FINDINGS:  A sessile polyp was found in the sigmoid colon. It was 4 mm in size. The polyp was removed using cold biopsy forceps.  A sessile polyp was found in the rectum. It was 7 mm in size. Polyp was snared, then cauterized with monopolar cautery. Retrieval was successful. Moderate diverticulosis was found in the sigmoid to descending colon.  Otherwise normal colonoscopy without other polyps, masses, vascular ectasias, or inflammatory changes. Retroflexed views in the rectum revealed no abnormalities.  The time to cecum =  2  minutes. The scope was then withdrawn (time = 11.25  min) from the patient and the procedure completed.  COMPLICATIONS:  None  ENDOSCOPIC IMPRESSION: 1) 4 mm sessile polyp in the sigmoid colon 2) 7 mm sessile polyp in the rectum 3) Moderate diverticulosis in the sigmoid to descending colon   RECOMMENDATIONS: 1) Hold aspirin, aspirin  products, and anti-inflammatory medication for 2 weeks. 2) Await pathology results 3) High fiber diet with liberal fluid intake. 4) Repeat Colonoscopy in 5 years if polyps are adenomatous, otherwise 10 years.  Jessica Lick. Russella Dar, MD, [facility]  n. eSIGNED:   Venita Lick. Jenniah [name] at 01/10/2012 11:15 AM  [place] Qua, [phone]

## 2012-01-10 NOTE — Patient Instructions (Signed)

## 2012-01-11 ENCOUNTER — Telehealth: Payer: Self-pay | Admitting: *Deleted

## 2012-01-11 NOTE — Telephone Encounter (Signed)
Voice mail came on stated your party is not answering no message left.

## 2012-01-16 ENCOUNTER — Encounter: Payer: Self-pay | Admitting: Gastroenterology

## 2012-01-16 ENCOUNTER — Encounter: Payer: Self-pay | Admitting: Family Medicine

## 2012-02-01 ENCOUNTER — Telehealth: Payer: Self-pay

## 2012-02-01 MED ORDER — GLIMEPIRIDE 1 MG PO TABS: 1.0000 mg | ORAL_TABLET | Freq: Every day | ORAL | Status: DC

## 2012-02-01 MED ORDER — METFORMIN HCL ER (OSM) 1000 MG PO TB24: 1000.0000 mg | ORAL_TABLET | Freq: Every day | ORAL | Status: DC

## 2012-02-01 NOTE — Telephone Encounter (Signed)
Since pt started Metformin XR 750 mg taking 1 at breakfast FBS fluctuate between 200-300.If BS over 200 pt has blurred vision,urine frequency and dizziness. Today FBS 208; last night pt ate hamburger with fries. Pt said has had no diet changes and tries to watch diet. Please advise. [facility] [address]

## 2012-02-01 NOTE — Telephone Encounter (Signed)
Let's increase metformin to 1000mg  XL daily. Would also like to start glimeperide at 1mg  daily, but will need to keep close eye on sugars when starting as this medicine can drop them. Sent both to pharmacy.

## 2012-02-01 NOTE — Telephone Encounter (Signed)
Patient notified. She will start new meds and monitor sugars closely. She will call with any problems.

## 2012-03-11 ENCOUNTER — Ambulatory Visit (INDEPENDENT_AMBULATORY_CARE_PROVIDER_SITE_OTHER): Payer: Medicare Other | Admitting: Family Medicine

## 2012-03-11 ENCOUNTER — Encounter: Payer: Self-pay | Admitting: Family Medicine

## 2012-03-11 VITALS — BP 128/60 | HR 61 | Temp 97.7°F | Ht 63.0 in | Wt 276.0 lb

## 2012-03-11 DIAGNOSIS — F319 Bipolar disorder, unspecified: Secondary | ICD-10-CM

## 2012-03-11 DIAGNOSIS — E039 Hypothyroidism, unspecified: Secondary | ICD-10-CM

## 2012-03-11 DIAGNOSIS — IMO0001 Reserved for inherently not codable concepts without codable children: Secondary | ICD-10-CM

## 2012-03-11 DIAGNOSIS — M25562 Pain in left knee: Secondary | ICD-10-CM

## 2012-03-11 DIAGNOSIS — M25569 Pain in unspecified knee: Secondary | ICD-10-CM

## 2012-03-11 DIAGNOSIS — I10 Essential (primary) hypertension: Secondary | ICD-10-CM

## 2012-03-11 LAB — BASIC METABOLIC PANEL
BUN: 19 mg/dL (ref 6–23)
GFR: 60.22 mL/min (ref >60.00)
Glucose, Bld: 141 mg/dL — ABNORMAL HIGH (ref 70–99)
Potassium: 4.1 mEq/L (ref 3.5–5.1)

## 2012-03-11 MED ORDER — GLIMEPIRIDE 2 MG PO TABS: 2.0000 mg | ORAL_TABLET | Freq: Every day | ORAL | Status: DC

## 2012-03-11 NOTE — Assessment & Plan Note (Signed)
Stable, continue geodon.

## 2012-03-11 NOTE — Assessment & Plan Note (Signed)
Chronic, stable. Continue meds. 

## 2012-03-11 NOTE — Assessment & Plan Note (Signed)
Chronic, has been stable. Check TSH today.

## 2012-03-11 NOTE — Progress Notes (Signed)
  Subjective:    Patient ID: [name], female    DOB: 20-Nov-1950, 61 y.o.   MRN: [id number]  HPI CC: 3 mo f/u  Lab Results  Component Value Date   HGBA1C 6.7* 09/12/2011    HTN - No HA, vision changes, CP/tightness, leg swelling.  Tolerating meds well.  OSA - on CPAP.  Finds really needs this, does well with CPAP.  DM - saw eye doctor 2 wks ago (02/2012), new glasses.  Brings log of sugars - showing have been running high.  Fasting sugars range 170-289.  After lunch 170-230s, after dinner 190-280s.  Has noted big difference since started glimeperide.  Trying to cut back on carbs. Wt Readings from Last 3 Encounters:  03/11/12 276 lb (125.193 kg)  01/10/12 275 lb (124.739 kg)  12/27/11 275 lb 11.2 oz (125.057 kg)    L knee - saw ortho - told needs knee replacement for bad arthritis.  Given cortisone shot.  Started on meloxicam. Lab Results  Component Value Date   CREATININE 0.8 09/12/2011    Colonoscopy for + iFOB - 2 hyperplastic polyps and diverticulosis, rpt 10 yrs. ([facility])  Past Medical History  Diagnosis Date  . Migraine   . GERD (gastroesophageal reflux disease)   . Anxiety   . Chronic bronchitis     mild COPD  . Hypertension   . Bipolar disorder   . Coronary artery disease, non-occlusive   . Hypothyroidism   . Fibromyalgia   . OSA (obstructive sleep apnea) 03/16/2011  . History of chicken pox   . HLD (hyperlipidemia)   . Urinary incontinence   . T2DM (type 2 diabetes mellitus) 2012    DM education 06/2011     Review of Systems Per HPI    Objective:   Physical Exam  Nursing note and vitals reviewed. Constitutional: She appears well-developed and well-nourished. No distress.  HENT:  Head: Normocephalic and atraumatic.  Right Ear: External ear normal.  Left Ear: External ear normal.  Nose: Nose normal.  Mouth/Throat: Oropharynx is clear and moist. No oropharyngeal exudate.  Eyes: Conjunctivae and EOM are normal. Pupils are equal, round, and reactive to  light. No scleral icterus.  Neck: Normal range of motion. Neck supple. No thyromegaly present.  Cardiovascular: Normal rate, regular rhythm, normal heart sounds and intact distal pulses.   No murmur heard. Pulmonary/Chest: Effort normal and breath sounds normal. No respiratory distress. She has no wheezes. She has no rales.  Musculoskeletal: She exhibits no edema.          Lymphadenopathy:    She has no cervical adenopathy.  Skin: Skin is warm and dry. No rash noted.  Psychiatric: She has a normal mood and affect.       Assessment & Plan:

## 2012-03-11 NOTE — Assessment & Plan Note (Signed)
Chronic. Uncontrolled as evidenced by log she brings. Slowly titrate meds. Start with increase in amaryl to 2mg  daily. Discussed possibly starting [place] as pt hesitant for shots. rtc 3 mo Recheck blood work today.

## 2012-03-11 NOTE — Patient Instructions (Signed)
Blood work today, we will increase glimeperide to 2mg  daily (may double up on dose until you pick up new script) I'm glad colonoscopy returned ok. Return in 3 months for follow up

## 2012-03-11 NOTE — Assessment & Plan Note (Signed)
Chronic. Followed by ortho. Steroid shot did not help. Now on mobic. Discussing future replacement.

## 2012-03-25 ENCOUNTER — Other Ambulatory Visit: Payer: Self-pay | Admitting: Family Medicine

## 2012-03-25 DIAGNOSIS — N63 Unspecified lump in unspecified breast: Secondary | ICD-10-CM

## 2012-04-16 ENCOUNTER — Telehealth: Payer: Self-pay | Admitting: Family Medicine

## 2012-04-16 MED ORDER — SITAGLIPTIN PHOSPHATE 100 MG PO TABS: 100.0000 mg | ORAL_TABLET | Freq: Every day | ORAL | Status: DC

## 2012-04-16 NOTE — Telephone Encounter (Signed)
plz notify I reviewed log of sugars she brings - I appreciate this. Sugars better but still staying too high. Would like to start januvia at 50mg  daily for 6 days (1/2 pill) then increase to 100mg  daily.  Sent in.  Check with pharmacy to see if can cut in half Continue other meds.  Keep appt for f/u. (i've asked to scan log into chart)

## 2012-04-17 MED ORDER — SITAGLIPTIN PHOSPHATE 50 MG PO TABS: 50.0000 mg | ORAL_TABLET | Freq: Every day | ORAL | Status: DC

## 2012-04-17 NOTE — Telephone Encounter (Signed)
Checked with pharmacy-januvia cannot be cut in half. I haven't called patient yet. Please advise.

## 2012-04-17 NOTE — Telephone Encounter (Signed)
Pt went to pharmacy to pick up other meds and also got Januvia. Pt wanted to make sure she is to continue other meds as instructed. Advised pt as instructed to continue other meds and take Januvia 50 mg daily until f/u appt 06/11/12. Pt will call back if has further questions or problems.

## 2012-04-17 NOTE — Telephone Encounter (Signed)
Sent in [place] 50mg  to start.  Have her start at 50mg  daily until f/u.

## 2012-04-23 NOTE — Telephone Encounter (Addendum)
Lets start at 50mg  daily until f/u.

## 2012-04-23 NOTE — Telephone Encounter (Signed)
Pt called back; pt picked up Januvia 100 mg on 04/16/12. Spoke with Missy at [address]; Missy said Januvia 100 mg was discontinued; I explained that was correct but pt picked up Januvia 100 mg on 04/16/12. [facility] called pt to let pt know Januvia 50 mg was waiting to be picked up. Missy could not explain whey flag did not come up with Januvia being sent in on 04/16/12 for 100 mg and then on 04/17/12 for 50 mg. I asked pt if she remembered our conversation on 04/17/12 at 5:08 pm and she said yes but pt just noticed taking 100 mg. Pt states since taking 100 mg FBS  Range 144-180; 2 hr after supper BS ranged 151-207. On 04/20/12 prior to lunch pt "felt funny" took bs and bs was 74; pt ate candy bar and felt OK bs then was 114. Pt wants to know if should continue  Januvia 100 mg  Or stop 100mg  and pick up Januvia  50 mg.Please advise.

## 2012-04-23 NOTE — Telephone Encounter (Signed)
Patient notified and will start 50 mg tabs.

## 2012-05-11 ENCOUNTER — Other Ambulatory Visit: Payer: Self-pay | Admitting: Family Medicine

## 2012-05-20 ENCOUNTER — Ambulatory Visit
Admission: RE | Admit: 2012-05-20 | Discharge: 2012-05-20 | Disposition: A | Discharge status: Home / Self Care | Payer: Medicare Other | Source: Ambulatory Visit | Attending: Family Medicine | Admitting: Family Medicine

## 2012-05-20 DIAGNOSIS — N63 Unspecified lump in unspecified breast: Secondary | ICD-10-CM

## 2012-05-21 ENCOUNTER — Encounter: Payer: Self-pay | Admitting: *Deleted

## 2012-06-05 ENCOUNTER — Encounter: Payer: Self-pay | Admitting: Pulmonary Disease

## 2012-06-05 ENCOUNTER — Ambulatory Visit (INDEPENDENT_AMBULATORY_CARE_PROVIDER_SITE_OTHER): Payer: Medicare Other | Admitting: Pulmonary Disease

## 2012-06-05 VITALS — BP 142/88 | HR 86 | Temp 96.8°F | Ht 64.0 in | Wt 289.0 lb

## 2012-06-05 DIAGNOSIS — G4733 Obstructive sleep apnea (adult) (pediatric): Secondary | ICD-10-CM

## 2012-06-05 DIAGNOSIS — R0609 Other forms of dyspnea: Secondary | ICD-10-CM

## 2012-06-05 DIAGNOSIS — R0600 Dyspnea, unspecified: Secondary | ICD-10-CM

## 2012-06-05 DIAGNOSIS — R0989 Other specified symptoms and signs involving the circulatory and respiratory systems: Secondary | ICD-10-CM

## 2012-06-05 NOTE — Assessment & Plan Note (Signed)
She is compliant with CPAP and reports benefit.  Will arrange for new CPAP mask and get copy of her CPAP download.

## 2012-06-05 NOTE — Patient Instructions (Signed)
Will get report from CPAP machine Will arrange for new CPAP mask Follow up in 1 year

## 2012-06-05 NOTE — Progress Notes (Signed)
Chief Complaint  Patient presents with  . Follow-up    breathing has unchanged. denies any wheexzing, chest tx, cough  . Sleep Apnea    wears cpap everynight x 8-10 hrs a night. denies any problems w/ mask/machine    History of Present Illness: [name] is a 61 y.o. female former smoker with sleep apnea on CPAP and dyspnea related to obesity/deconditioning.  Her breathing is unchanged.  She is planning on having left knee replacement with Dr. Katrinka Blazing in [place].  This will likely be done in January.  She is doing well with CPAP.  She has not received a new mask since original set up.  Tests: CXR 01/03/11>>Changes of COPD with upper normal to slightly increased heart size.  Lt & Rt heart cath 03/23/11>>Central aortic pressure 146/75, LV146/14/20, RA 7, RV 37/8/12, PA 33/12/22, PCWP 11. CO 5.3 L/min. CI 2.4 L/min/m2 per. PAsaturation 68%. Central aortic saturation 96%. PSG 03/28/11>>AHI 24.4, SpO2 low 91% PFT 04/05/11>>FEV1 2.39(103%), FEV1% 80, TLC 4.46(91%), DLCO 68%, no BD Echo 04/07/11>>EF 55 to 60%, grade 1 diastolic dysfx, mild LA dilation Auto CPAP 04/28/11 to 05/18/11>>Used on 20 of 21 nights with average 4 hrs 42 min.  Average AHI 0.3 with 95th percentile pressure 12 cm H2O.   Past Medical History  Diagnosis Date  . Migraine   . GERD (gastroesophageal reflux disease)   . Anxiety   . Chronic bronchitis   . Hypertension   . Bipolar disorder   . Coronary artery disease, non-occlusive   . Hypothyroidism   . Fibromyalgia   . OSA (obstructive sleep apnea) 03/16/2011  . History of chicken pox   . HLD (hyperlipidemia)   . Urinary incontinence   . T2DM (type 2 diabetes mellitus) 2012    DM education 06/2011    Past Surgical History  Procedure Date  . Total abdominal hysterectomy 2004  . Knee surgery 2006    left, torn meniscus  . Cardiac catherization 03/2011    x3  . Esophagogastroduodenoscopy 2006  . Colonoscopy 12/2011    hyperplastic polyp x2,. diverticulosis,  rec rpt 10 yrs    Allergies  Allergen Reactions  . Lithium Other (See Comments)    abd pain, n/v, was hospitalized  . Quetiapine Other (See Comments)    Muscle twitching, muscle spasm    Physical Exam:  Filed Vitals:   06/05/12 1509 06/05/12 1511  BP:  142/88  Pulse:  86  Temp: 96.8 F (36 C)   TempSrc: Oral   Height: 5\' 4"  (1.626 m)   Weight: 289 lb (131.09 kg)   SpO2:  96%   Body mass index is 49.61 kg/(m^2).  Wt Readings from Last 2 Encounters:  06/05/12 289 lb (131.09 kg)  03/11/12 276 lb (125.193 kg)    General - Obese  HEENT - no sinus tenderness, no oral lesions Cardiac - s1s2 regular  Chest - decreased breath sounds, no wheeze  Abd - obese, soft, nontender  Ext - no edema  Neuro - normal strength, CN intact  Psych - normal mood, behavior   Assessment/Plan:  Outpatient Encounter Prescriptions as of 06/05/2012  Medication Sig Dispense Refill  . aspirin 81 MG tablet Take 160 mg by mouth daily.      . Blood Glucose Monitoring Suppl (ONE TOUCH ULTRA SYSTEM KIT) W/DEVICE KIT 1 kit by Does not apply route once.  1 each  0  . Cimetidine (ACID REDUCER PO) Take 1 tablet by mouth daily.      . Cyanocobalamin (B-12  PO) Take by mouth daily.      . diazepam (VALIUM) 5 MG tablet As needed       . glimepiride (AMARYL) 2 MG tablet Take 1 tablet (2 mg total) by mouth daily before breakfast.  30 tablet  11  . levothyroxine (SYNTHROID, LEVOTHROID) 100 MCG tablet Take 1 tablet (100 mcg total) by mouth daily.  90 tablet  3  . meloxicam (MOBIC) 15 MG tablet Take 15 mg by mouth daily.       . metFORMIN (FORTAMET) 1000 MG (OSM) 24 hr tablet Take 1 tablet (1,000 mg total) by mouth daily with breakfast.  30 tablet  11  . metoprolol (LOPRESSOR) 50 MG tablet Take 1 tablet (50 mg total) by mouth 2 (two) times daily.  180 tablet  3  . Multiple Vitamin (MULTIVITAMIN) tablet Take 1 tablet by mouth daily.      . sitaGLIPtin (JANUVIA) 50 MG tablet Take 1 tablet (50 mg total) by mouth  daily.  30 tablet  11  . triamterene-hydrochlorothiazide (DYAZIDE) 37.5-25 MG per capsule TAKE ONE CAPSULE BY MOUTH EVERY DAY  90 capsule  3  . ziprasidone (GEODON) 40 MG capsule Take 1 capsule (40 mg total) by mouth 2 (two) times daily with a meal.  60 capsule  11    [name] Pager:  [phone] 06/05/2012, 3:41 PM

## 2012-06-11 ENCOUNTER — Ambulatory Visit (INDEPENDENT_AMBULATORY_CARE_PROVIDER_SITE_OTHER): Payer: Medicare Other | Admitting: Family Medicine

## 2012-06-11 ENCOUNTER — Encounter: Payer: Self-pay | Admitting: Family Medicine

## 2012-06-11 VITALS — BP 134/72 | HR 72 | Temp 97.8°F | Wt 287.0 lb

## 2012-06-11 DIAGNOSIS — I10 Essential (primary) hypertension: Secondary | ICD-10-CM

## 2012-06-11 DIAGNOSIS — IMO0001 Reserved for inherently not codable concepts without codable children: Secondary | ICD-10-CM

## 2012-06-11 DIAGNOSIS — E1165 Type 2 diabetes mellitus with hyperglycemia: Secondary | ICD-10-CM

## 2012-06-11 DIAGNOSIS — Z23 Encounter for immunization: Secondary | ICD-10-CM

## 2012-06-11 DIAGNOSIS — E785 Hyperlipidemia, unspecified: Secondary | ICD-10-CM

## 2012-06-11 MED ORDER — SITAGLIPTIN PHOSPHATE 100 MG PO TABS: 100.0000 mg | ORAL_TABLET | Freq: Every day | ORAL | Status: DC

## 2012-06-11 MED ORDER — LISINOPRIL 5 MG PO TABS: 5.0000 mg | ORAL_TABLET | Freq: Every day | ORAL | Status: DC

## 2012-06-11 NOTE — Progress Notes (Signed)
  Subjective:    Patient ID: [name], female    DOB: 10-02-1950, 61 y.o.   MRN: [id number]  HPI CC: 3 mo f/u  To change insurance in January - [employer].  DM - diarrhea with immediate release metformin.  brings log of sugars - fasting over last month 160-199.  On metformin XR 1000mg  daily, and on amaryl 2mg  daily and januvia 50mg  daily.  Poor dietary choices.  Trying to get salads at least once daily.  Eye exam 02/2012.  Foot exam 11/2011. Lab Results  Component Value Date   HGBA1C 9.1* 03/11/2012    HTN - compliant with metoprolol and triamterene hctz.  Not on ACEI, but no contraindication.  No HA, vision changes, CP/tightness, SOB, leg swelling.  Lab Results  Component Value Date   CHOL 188 05/05/2011   HDL 40.60 05/05/2011   LDLDIRECT 111.2 05/05/2011   TRIG 240.0* 05/05/2011   CHOLHDL 5 05/05/2011   Trouble staying active 2/2 knee arthritis.  Had discussed knee replacement in past.   Wt Readings from Last 3 Encounters:  06/11/12 287 lb (130.182 kg)  06/05/12 289 lb (131.09 kg)  03/11/12 276 lb (125.193 kg)    Review of Systems Per HPI    Objective:   Physical Exam  Nursing note and vitals reviewed. Constitutional: She appears well-developed and well-nourished. No distress.       obese  HENT:  Head: Normocephalic and atraumatic.  Mouth/Throat: Oropharynx is clear and moist. No oropharyngeal exudate.  Eyes: Conjunctivae normal and EOM are normal. Pupils are equal, round, and reactive to light. No scleral icterus.  Neck: Normal range of motion. Neck supple.  Cardiovascular: Normal rate, regular rhythm, normal heart sounds and intact distal pulses.   No murmur heard. Pulmonary/Chest: Effort normal and breath sounds normal. No respiratory distress. She has no wheezes. She has no rales.  Musculoskeletal: She exhibits no edema.  Lymphadenopathy:    She has no cervical adenopathy.  Skin: Skin is warm and dry. No rash noted.  Psychiatric: She has a normal mood and affect.        Assessment & Plan:

## 2012-06-11 NOTE — Patient Instructions (Addendum)
Let's stop metoprolol, start lisinopril at 5mg  daily.  Keep an eye on blood pressure.  If creeping up, let me know.  Goal <135/85. For diabetes, increase januvia to 100mg  daily. Return in 2 weeks for blood work.  We will check kidneys on lisinopril and sugar. Flu shot today. Good to see you today, call us with quesitons. Return in 3 months for follow up.

## 2012-06-11 NOTE — Assessment & Plan Note (Signed)
Chronic, stable. Given h/o DM, change beta blocker to ACEI.  Start lisinopril.

## 2012-06-11 NOTE — Assessment & Plan Note (Signed)
Check FLP when returns fasting. lipitor in past caused mood swings, crestor caused myalgias.

## 2012-06-11 NOTE — Assessment & Plan Note (Addendum)
Chronic, remains uncontrolled.  Increase januvia, if doesn't help, increase amaryl, if not improved, start insulin. Discussed healthier dietary choices and importance of weight loss. Discussed side effects to watch out for with taking [place].

## 2012-06-12 ENCOUNTER — Other Ambulatory Visit: Payer: Self-pay | Admitting: Family Medicine

## 2012-06-13 ENCOUNTER — Other Ambulatory Visit: Payer: Self-pay

## 2012-06-13 MED ORDER — GLUCOSE BLOOD VI STRP: ORAL_STRIP | Status: DC

## 2012-06-13 NOTE — Telephone Encounter (Signed)
[facility] [address] faxed request for instructions how often pt cks BS. Pt stated cks twice a day.

## 2012-06-20 ENCOUNTER — Telehealth: Payer: Self-pay | Admitting: Pulmonary Disease

## 2012-06-20 NOTE — Telephone Encounter (Signed)
[id number] x1 

## 2012-06-20 NOTE — Telephone Encounter (Signed)
I spoke with patient about results and she verbalized understanding and had no questions 

## 2012-06-20 NOTE — Telephone Encounter (Signed)
Auto CPAP 03/20/12 to 06/17/12>>Used on 90 of 90 nights with average 10 hrs 26 min.  Average AHI 0.4 with median CPAP 9 cm H2O and 95th percentile CPAP 12 cm H2O.  Will have my nurse inform pt that CPAP report looks good.  No change to current set up needed.

## 2012-06-20 NOTE — Telephone Encounter (Signed)
Pt returned triage's call.  Holly D Pryor ° °

## 2012-06-25 ENCOUNTER — Other Ambulatory Visit (INDEPENDENT_AMBULATORY_CARE_PROVIDER_SITE_OTHER): Payer: Medicare Other

## 2012-06-25 DIAGNOSIS — E1165 Type 2 diabetes mellitus with hyperglycemia: Secondary | ICD-10-CM

## 2012-06-25 DIAGNOSIS — IMO0001 Reserved for inherently not codable concepts without codable children: Secondary | ICD-10-CM

## 2012-06-25 DIAGNOSIS — E785 Hyperlipidemia, unspecified: Secondary | ICD-10-CM

## 2012-06-25 DIAGNOSIS — I10 Essential (primary) hypertension: Secondary | ICD-10-CM

## 2012-06-25 LAB — LDL CHOLESTEROL, DIRECT: Direct LDL: 145.8 mg/dL

## 2012-06-25 LAB — LIPID PANEL
HDL: 32.9 mg/dL — ABNORMAL LOW (ref >39.00)
Triglycerides: 329 mg/dL — ABNORMAL HIGH (ref 0.0–149.0)

## 2012-06-25 LAB — BASIC METABOLIC PANEL
BUN: 16 mg/dL (ref 6–23)
Calcium: 10 mg/dL (ref 8.4–10.5)
GFR: 74.59 mL/min (ref >60.00)
Glucose, Bld: 159 mg/dL — ABNORMAL HIGH (ref 70–99)
Sodium: 135 mEq/L (ref 135–145)

## 2012-06-25 LAB — MICROALBUMIN / CREATININE URINE RATIO
Creatinine,U: 126.4 mg/dL
Microalb Creat Ratio: 8.2 mg/g (ref 0.0–30.0)

## 2012-07-09 ENCOUNTER — Ambulatory Visit (INDEPENDENT_AMBULATORY_CARE_PROVIDER_SITE_OTHER)
Admission: RE | Admit: 2012-07-09 | Discharge: 2012-07-09 | Disposition: A | Discharge status: Home / Self Care | Payer: Medicare Other | Source: Ambulatory Visit | Attending: Family Medicine | Admitting: Family Medicine

## 2012-07-09 ENCOUNTER — Encounter: Payer: Self-pay | Admitting: Family Medicine

## 2012-07-09 ENCOUNTER — Ambulatory Visit (INDEPENDENT_AMBULATORY_CARE_PROVIDER_SITE_OTHER): Payer: Medicare Other | Admitting: Family Medicine

## 2012-07-09 VITALS — BP 128/84 | HR 92 | Temp 98.0°F | Wt 286.2 lb

## 2012-07-09 DIAGNOSIS — I10 Essential (primary) hypertension: Secondary | ICD-10-CM

## 2012-07-09 DIAGNOSIS — M25569 Pain in unspecified knee: Secondary | ICD-10-CM

## 2012-07-09 DIAGNOSIS — Z9989 Dependence on other enabling machines and devices: Secondary | ICD-10-CM

## 2012-07-09 DIAGNOSIS — R0600 Dyspnea, unspecified: Secondary | ICD-10-CM

## 2012-07-09 DIAGNOSIS — Z0000 Encounter for general adult medical examination without abnormal findings: Secondary | ICD-10-CM

## 2012-07-09 DIAGNOSIS — E1165 Type 2 diabetes mellitus with hyperglycemia: Secondary | ICD-10-CM

## 2012-07-09 DIAGNOSIS — I2510 Atherosclerotic heart disease of native coronary artery without angina pectoris: Secondary | ICD-10-CM

## 2012-07-09 DIAGNOSIS — M25562 Pain in left knee: Secondary | ICD-10-CM

## 2012-07-09 DIAGNOSIS — R0609 Other forms of dyspnea: Secondary | ICD-10-CM

## 2012-07-09 DIAGNOSIS — Z01818 Encounter for other preprocedural examination: Secondary | ICD-10-CM

## 2012-07-09 DIAGNOSIS — IMO0002 Reserved for concepts with insufficient information to code with codable children: Secondary | ICD-10-CM

## 2012-07-09 DIAGNOSIS — IMO0001 Reserved for inherently not codable concepts without codable children: Secondary | ICD-10-CM

## 2012-07-09 DIAGNOSIS — R0989 Other specified symptoms and signs involving the circulatory and respiratory systems: Secondary | ICD-10-CM

## 2012-07-09 DIAGNOSIS — E785 Hyperlipidemia, unspecified: Secondary | ICD-10-CM

## 2012-07-09 DIAGNOSIS — G4733 Obstructive sleep apnea (adult) (pediatric): Secondary | ICD-10-CM

## 2012-07-09 DIAGNOSIS — K219 Gastro-esophageal reflux disease without esophagitis: Secondary | ICD-10-CM

## 2012-07-09 DIAGNOSIS — Z139 Encounter for screening, unspecified: Secondary | ICD-10-CM

## 2012-07-09 LAB — CBC WITH DIFFERENTIAL/PLATELET
Basophils Absolute: 0.1 10*3/uL (ref 0.0–0.1)
Basophils Relative: 0.3 % (ref 0.0–3.0)
Eosinophils Absolute: 0 10*3/uL (ref 0.0–0.7)
Lymphocytes Relative: 19.4 % (ref 12.0–46.0)
MCHC: 33.5 g/dL (ref 30.0–36.0)
Monocytes Relative: 5.1 % (ref 3.0–12.0)
Neutrophils Relative %: 75.2 % (ref 43.0–77.0)
RBC: 4.13 Mil/uL (ref 3.87–5.11)
RDW: 13.7 % (ref 11.5–14.6)

## 2012-07-09 MED ORDER — GLIMEPIRIDE 4 MG PO TABS: 4.0000 mg | ORAL_TABLET | Freq: Every day | ORAL | Status: DC

## 2012-07-09 MED ORDER — METFORMIN HCL 500 MG PO TABS: ORAL_TABLET | ORAL | Status: DC

## 2012-07-09 NOTE — Progress Notes (Signed)
Subjective:    Patient ID: [name], female    DOB: 10-Sep-1950, 61 y.o.   MRN: [id number]  HPI CC: preop clearance  Pleasant obese 61 yo with h/o diabetes, HTN, HLD, OSA on CPAP (Dr. Craige Cotta),   L knee replacement planned 08/2012 by Dr. Reita Chard at [facility].  Has had prior knee surgery - L cartilage repair around 2006.    Having severe pain limiting activity. R knee doing well, early discomfort.  Has tolerated GETA anesthesia, for hysterectomy 2004 and previous knee surgery 2006.  Allergic to lithium and seroquel, no other meds.  Also had intolerance to some hormone years ago.  Denies recent chest pain, tightness, coughing, wheezing, leg swelling, HA, dizziness.  Chronic dyspnea on minimal exertion for last 2 years - started after quit smoking.  Had eval by Dr. Craige Cotta, thought related to obesity/deconditioning.  Told no COPD.  Not on hormonal meds.  No h/o immobility.  No personal or family hx blood clots. 2D Echo 03/2011 - mildly dilated LA, mild diastolic dysfunction, normal LV fxn, EF 55-60%. S/p 3 catheterizations - last was 03/2011 - mild non-obstructive coronary disease.  At that time dyspnea thought multifactorial and mostly due to pulmonary disease and morbid obesity. PFTs 03/2011 - spirometry WNL  Denies EtOH, rec drugs. Quit smoking 2 yrs ago.    DM - compliant with meds.  Checks sugars bid, brings log.  Vision exam 02/2012.  Foot exam 11/2011.  Has been taking metformin 1000 mg XR daily, requests change back to immediate release. Log of sugars - fasting 160-200, postprandial 200-300. Log of BP - 160-200/80-100s.  Using wrist cuff, wonders if falsely elevated due to this as today in office normal.  Wt Readings from Last 3 Encounters:  07/09/12 286 lb 4 oz (129.842 kg)  06/11/12 287 lb (130.182 kg)  06/05/12 289 lb (131.09 kg)    Medications and allergies reviewed and updated in chart.  Past histories reviewed and updated if relevant as below. Patient Active  Problem List  Diagnosis  . Dyspnea  . Diabetes type 2, uncontrolled  . OSA (obstructive sleep apnea)  . CAD (coronary artery disease)  . Hypothyroidism  . Medicare welcome visit  . [place] intertrigo  . Breast pain  . Hypertension  . Bipolar disorder  . Fibromyalgia  . HLD (hyperlipidemia)  . GERD (gastroesophageal reflux disease)  . Anxiety  . Leg pain  . Left knee pain   Past Medical History  Diagnosis Date  . Migraine   . GERD (gastroesophageal reflux disease)   . Anxiety   . Chronic bronchitis   . Hypertension   . Bipolar disorder   . Coronary artery disease, non-occlusive   . Hypothyroidism   . Fibromyalgia   . OSA (obstructive sleep apnea) 03/16/2011  . History of chicken pox   . HLD (hyperlipidemia)   . Urinary incontinence   . T2DM (type 2 diabetes mellitus) 2012    DM education 06/2011  . Dyspnea 2012    s/p pulm/cards w/u WNL, thought due to obesity/deconditioning   Past Surgical History  Procedure Date  . Total abdominal hysterectomy 2004  . Knee surgery 2006    left, torn meniscus  . Cardiac catherization 03/2011    x3  . Esophagogastroduodenoscopy 2006  . Colonoscopy 12/2011    hyperplastic polyp x2,. diverticulosis, rec rpt 10 yrs   History  Substance Use Topics  . Smoking status: Former Smoker -- 1.0 packs/day for 43 years    Types: Cigarettes  Quit date: 08/21/2009  . Smokeless tobacco: Never Used  . Alcohol Use: No   Family History  Problem Relation Age of Onset  . COPD Sister   . Asthma Maternal Uncle   . Cancer Maternal Uncle     colon  . Aneurysm Maternal Uncle   . Colon cancer Maternal Uncle 60  . Lung cancer Cousin   . Cancer Cousin     lung  . Hypertension Mother   . Stroke Mother   . Arthritis Mother   . Cancer Father 54    brain tumor  . Diabetes Father   . Thyroid disease Father   . Cancer Maternal Aunt 48    breast  . Cancer Paternal Uncle     prostate  . Coronary artery disease Neg Hx   . Stomach cancer Neg  Hx    Allergies  Allergen Reactions  . Lithium Other (See Comments)    abd pain, n/v, was hospitalized  . Quetiapine Other (See Comments)    Muscle twitching, muscle spasm   Current Outpatient Prescriptions on File Prior to Visit  Medication Sig Dispense Refill  . aspirin 81 MG tablet Take 160 mg by mouth daily.      . Blood Glucose Monitoring Suppl (ONE TOUCH ULTRA SYSTEM KIT) W/DEVICE KIT 1 kit by Does not apply route once.  1 each  0  . Cimetidine (ACID REDUCER PO) Take 1 tablet by mouth daily.      . Cyanocobalamin (B-12 PO) Take by mouth daily.      Marland Kitchen glimepiride (AMARYL) 2 MG tablet Take 1 tablet (2 mg total) by mouth daily before breakfast.  30 tablet  11  . glucose blood (ONE TOUCH ULTRA TEST) test strip Check blood sugar twice daily and as directed.250.02  100 each  11  . levothyroxine (SYNTHROID, LEVOTHROID) 100 MCG tablet Take 1 tablet (100 mcg total) by mouth daily.  90 tablet  3  . lisinopril (PRINIVIL,ZESTRIL) 5 MG tablet Take 1 tablet (5 mg total) by mouth daily.  30 tablet  3  . meloxicam (MOBIC) 15 MG tablet Take 15 mg by mouth daily.       . metFORMIN (FORTAMET) 1000 MG (OSM) 24 hr tablet Take 1 tablet (1,000 mg total) by mouth daily with breakfast.  30 tablet  11  . Multiple Vitamin (MULTIVITAMIN) tablet Take 1 tablet by mouth daily.      . sitaGLIPtin (JANUVIA) 100 MG tablet Take 1 tablet (100 mg total) by mouth daily.  30 tablet  11  . triamterene-hydrochlorothiazide (DYAZIDE) 37.5-25 MG per capsule TAKE ONE CAPSULE BY MOUTH EVERY DAY  90 capsule  3  . ziprasidone (GEODON) 40 MG capsule Take 1 capsule (40 mg total) by mouth 2 (two) times daily with a meal.  60 capsule  11    Review of Systems Per HPI    Objective:   Physical Exam  Nursing note and vitals reviewed. Constitutional: She appears well-developed and well-nourished. No distress.       obese  HENT:  Head: Normocephalic and atraumatic.  Mouth/Throat: Oropharynx is clear and moist. No oropharyngeal  exudate.  Eyes: Conjunctivae normal and EOM are normal. Pupils are equal, round, and reactive to light.  Neck: Normal range of motion. Neck supple.  Cardiovascular: Normal rate, regular rhythm, normal heart sounds and intact distal pulses.   No murmur heard. Pulmonary/Chest: Effort normal and breath sounds normal. No respiratory distress. She has no wheezes. She has no rales.  Musculoskeletal: She exhibits  edema (nonpitting).  Lymphadenopathy:    She has no cervical adenopathy.  Skin: Skin is warm and dry. No rash noted.  Psychiatric: She has a normal mood and affect.       Assessment & Plan:

## 2012-07-09 NOTE — Patient Instructions (Addendum)
Stop either naproxen or meloxicam (both are in same family). Pass by [name]'s office for referral to heart doctor. Increase glimeperide to 2 pills daily (total of 4mg  daily).  New dose will be for 4mg  one pill daily. Change metformin to immediate release - 500mg  twice daily, if tolerated increase to 500mg  in am and 1000mg  at night. Blood work today to check blood counts. Assuming cleared by cardiology, should be ok to proceed with knee replacement surgery. Return to see me in 1 month for diabetes follow up

## 2012-07-11 MED ORDER — SIMVASTATIN 20 MG PO TABS: 20.0000 mg | ORAL_TABLET | Freq: Every day | ORAL | Status: DC

## 2012-07-11 NOTE — Assessment & Plan Note (Addendum)
Reviewed #s with pt.  LDL 145, trig 329. Goal LDL <100 given diabetic.  Discussed statin - will start simvastatin at 20mg  daily.  Pt agrees with plan. Myalgias to crestor in past.   Mood swings to lipitor in past.

## 2012-07-14 ENCOUNTER — Encounter: Payer: Self-pay | Admitting: Family Medicine

## 2012-07-14 DIAGNOSIS — Z01818 Encounter for other preprocedural examination: Secondary | ICD-10-CM | POA: Insufficient documentation

## 2012-07-14 NOTE — Assessment & Plan Note (Addendum)
Pending TKR.

## 2012-07-14 NOTE — Assessment & Plan Note (Signed)
Mild, nonobstructive as of last cardiac catheterization 03/2011.  Has seen Dr. Clifton James in past.

## 2012-07-14 NOTE — Assessment & Plan Note (Signed)
Chronic, stable 

## 2012-07-14 NOTE — Assessment & Plan Note (Signed)
Due for AMW.

## 2012-07-14 NOTE — Assessment & Plan Note (Signed)
Only on cimetidine.

## 2012-07-14 NOTE — Assessment & Plan Note (Addendum)
Presents for preop clearance today. Check CBC today.  H/o leukocytosis.  Cr stable.  Reviewed all other blood work with pt.  See above. CXR today - clear on my read EKG today - EKG - NSR at rate of 90s, normal axis, intervals, 2 PACs, nonspecific ST/T wave changes - some ST depression and diffuse T wave inversion/flattening.  good R wave progression.  Overall no change compared to prior EKG 2012. Has tolerated GETA in past. Initially, given abnormal EKG, recommended referral to cards - however reviewing records, she had unrevealing cardiac workup for same DOE done 03/2011 including catheterization with mild non obstructive CAD.  At that time had same DOE sxs.  Will cancel cards referral for now. Only risk factors for PE are obesity and relative sedentary lifestyle.  Prior to surgery will obtaining D dimer.

## 2012-07-14 NOTE — Assessment & Plan Note (Signed)
Stable. F/u with pulm.  Compliant with CPAP use at night.

## 2012-07-14 NOTE — Assessment & Plan Note (Signed)
A1c 7.6% - improved compared to prior.  Will change back to metformin IR per pt preference and slowly titrate up to achieve even tighter control - consider janumet. Goal for her is <7%. Currently on 500mg  metformin in am, 1000mg  metformin in pm, januvia 100mg , and amaryl 4mg  daily.  Compliant with meds.

## 2012-07-15 ENCOUNTER — Telehealth: Payer: Self-pay | Admitting: Family Medicine

## 2012-07-15 DIAGNOSIS — R0600 Dyspnea, unspecified: Secondary | ICD-10-CM

## 2012-07-15 NOTE — Assessment & Plan Note (Signed)
Has had unrevealing cardiac and pulmonary workup so far.  Known OSA.   Dyspnea on exertion thought multifactorial but predominantly due to obesity and deconditioning.  I wonder how much this dyspnea will affect ability to complete ortho rehab after TKR.

## 2012-07-15 NOTE — Telephone Encounter (Signed)
Patient notified and lab appt scheduled. [place]-please cancel cards appt. Thanks

## 2012-07-15 NOTE — Telephone Encounter (Signed)
plz notify - I reviewed heart workup from last year - given EKG unchanged from one done last year with non obstructive cardiac catheterization, I don't think she needs cards clearance for this upcoming surgery. I would like her to return for one more blood work - to check D dimer, and if this is elevated would obtain CT scan of chest to rule out blood clot as cause of SOB. Please cancel cards appt.  Please fax latest office visit to ortho along with recent blood work and results of D dimer.

## 2012-07-16 ENCOUNTER — Other Ambulatory Visit (INDEPENDENT_AMBULATORY_CARE_PROVIDER_SITE_OTHER): Payer: Medicare Other

## 2012-07-16 DIAGNOSIS — R0989 Other specified symptoms and signs involving the circulatory and respiratory systems: Secondary | ICD-10-CM

## 2012-07-16 DIAGNOSIS — R0600 Dyspnea, unspecified: Secondary | ICD-10-CM

## 2012-07-16 NOTE — Telephone Encounter (Signed)
Called Cards [place] and cancelled the appt.

## 2012-07-17 LAB — D-DIMER, QUANTITATIVE: D-Dimer, Quant: 0.53 ug/mL-FEU — ABNORMAL HIGH (ref 0.00–0.48)

## 2012-07-22 ENCOUNTER — Encounter: Payer: Self-pay | Admitting: Family Medicine

## 2012-08-08 ENCOUNTER — Ambulatory Visit (INDEPENDENT_AMBULATORY_CARE_PROVIDER_SITE_OTHER): Payer: Medicare Other | Admitting: Family Medicine

## 2012-08-08 ENCOUNTER — Encounter: Payer: Self-pay | Admitting: Family Medicine

## 2012-08-08 VITALS — BP 136/78 | HR 104 | Temp 97.6°F | Wt 285.2 lb

## 2012-08-08 DIAGNOSIS — I2510 Atherosclerotic heart disease of native coronary artery without angina pectoris: Secondary | ICD-10-CM

## 2012-08-08 DIAGNOSIS — R0989 Other specified symptoms and signs involving the circulatory and respiratory systems: Secondary | ICD-10-CM

## 2012-08-08 DIAGNOSIS — R0600 Dyspnea, unspecified: Secondary | ICD-10-CM

## 2012-08-08 DIAGNOSIS — R0609 Other forms of dyspnea: Secondary | ICD-10-CM

## 2012-08-08 DIAGNOSIS — Z01818 Encounter for other preprocedural examination: Secondary | ICD-10-CM

## 2012-08-08 DIAGNOSIS — E1165 Type 2 diabetes mellitus with hyperglycemia: Secondary | ICD-10-CM

## 2012-08-08 DIAGNOSIS — IMO0001 Reserved for inherently not codable concepts without codable children: Secondary | ICD-10-CM

## 2012-08-08 MED ORDER — SITAGLIPTIN PHOS-METFORMIN HCL 50-1000 MG PO TABS: 1.0000 | ORAL_TABLET | Freq: Two times a day (BID) | ORAL | Status: DC

## 2012-08-08 NOTE — Assessment & Plan Note (Addendum)
Mild, non obstructive as of last catheterization 03/2011.

## 2012-08-08 NOTE — Assessment & Plan Note (Signed)
Has had unrevealing cardiac and pulm workup so far.  Known OSA.  Attributed to obesity and deconditioning.

## 2012-08-08 NOTE — Patient Instructions (Addendum)
Call mail order to see how much janumet twice daily would cost for 3 mo supply, and then let me know if you want this medicine instead of individual components. Take away white starches/carbs - change to whole grain pasta, bread, brown rice, and sweet potatoes. Good to see you today, call us with questions.

## 2012-08-08 NOTE — Assessment & Plan Note (Signed)
Chronic, uncontrolled as evidenced by log of sugars she brings. Increase metformin to 1000mg  bid.  Continue januvia 100mg  and amaryl 4mg  daily. Provided with script for janumet for pt to price out - if equivalent rec start this. RTC 3 mo for f/u DM.

## 2012-08-08 NOTE — Progress Notes (Signed)
  Subjective:    Patient ID: [name], female    DOB: 1950/12/24, 61 y.o.   MRN: [id number]  HPI CC: 1 mo f/u  See prior note for details.  Seen here for preop clearance last month, initially discussed refrral to cards but reviewing records, and nonobstructive cath, deemed clear for surgery without cards clearance. Reviewed slightly elevated D dimer - 0.53.  Decided not to pursue CTA. Chronic leukocytosis.  Denies sxs of infection.  DM - compliant with meds.  Brings log of sugars showing fasting cbg's 170-200, postprandial pm sugars 190-300s.  Not compliant with diabetic diet.  Again reviewed this.  Foot exam today.  Lab Results  Component Value Date   HGBA1C 7.6* 06/25/2012    HTN - great control on current regimen according to home log. Wt Readings from Last 3 Encounters:  08/08/12 285 lb 4 oz (129.389 kg)  07/09/12 286 lb 4 oz (129.842 kg)  06/11/12 287 lb (130.182 kg)    BP Readings from Last 3 Encounters:  08/08/12 136/78  07/09/12 128/84  06/11/12 134/72    Past Medical History  Diagnosis Date  . Migraine   . GERD (gastroesophageal reflux disease)   . Anxiety   . Chronic bronchitis   . Hypertension   . Bipolar disorder   . Coronary artery disease, non-occlusive   . Hypothyroidism   . Fibromyalgia   . OSA (obstructive sleep apnea) 03/16/2011  . History of chicken pox   . HLD (hyperlipidemia)   . Urinary incontinence   . T2DM (type 2 diabetes mellitus) 2012    DM education 06/2011  . Dyspnea 2012    s/p pulm/cards w/u WNL, thought due to obesity/deconditioning    Review of Systems Per HPI    Objective:   Physical Exam  Nursing note and vitals reviewed. Constitutional: She appears well-developed and well-nourished. No distress.       obese  HENT:  Head: Normocephalic and atraumatic.  Mouth/Throat: Oropharynx is clear and moist. No oropharyngeal exudate.  Eyes: Conjunctivae normal and EOM are normal. Pupils are equal, round, and reactive to light. No  scleral icterus.  Cardiovascular: Normal rate, regular rhythm, normal heart sounds and intact distal pulses.   No murmur heard. Pulmonary/Chest: Effort normal and breath sounds normal. No respiratory distress. She has no wheezes. She has no rales.  Musculoskeletal: She exhibits no edema.       Diabetic foot exam: Normal inspection No skin breakdown Slight callus Normal DP/PT pulses Normal sensation to light touch and monofilament Nails normal  Skin: Skin is warm and dry. No rash noted.       Assessment & Plan:

## 2012-08-09 ENCOUNTER — Telehealth: Payer: Self-pay | Admitting: Family Medicine

## 2012-08-09 ENCOUNTER — Encounter: Payer: Self-pay | Admitting: Family Medicine

## 2012-08-09 NOTE — Telephone Encounter (Signed)
Joni from [employer] called stating that pt is having L total knee replacement on 08/27/12 and needs medical clearance.  Please fax to [phone] attn: Joni.

## 2012-08-12 NOTE — Assessment & Plan Note (Addendum)
I don't think pt merits further cardiac eval as status overall unchanged and has had unrevealing cardiac w/u in past. May touch base with cards re: need for further eval prior to surgery.

## 2012-08-19 ENCOUNTER — Ambulatory Visit: Payer: Self-pay | Admitting: Specialist

## 2012-08-19 LAB — PROTIME-INR
INR: 0.8
Prothrombin Time: 11.8 secs (ref 11.5–14.7)

## 2012-08-19 LAB — URINALYSIS, COMPLETE
Bilirubin,UR: NEGATIVE
Glucose,UR: NEGATIVE mg/dL (ref 0–75)
Ketone: NEGATIVE
Protein: NEGATIVE
RBC,UR: 20 /HPF (ref 0–5)
Squamous Epithelial: 25
Transitional Epi: 2
WBC UR: 202 /HPF (ref 0–5)

## 2012-08-19 LAB — BASIC METABOLIC PANEL
Anion Gap: 12 (ref 7–16)
BUN: 16 mg/dL (ref 7–18)
Calcium, Total: 9.7 mg/dL (ref 8.5–10.1)
Chloride: 100 mmol/L (ref 98–107)
Co2: 24 mmol/L (ref 21–32)
Creatinine: 0.99 mg/dL (ref 0.60–1.30)
EGFR (African American): >60
Glucose: 144 mg/dL — ABNORMAL HIGH (ref 65–99)
Osmolality: 276 (ref 275–301)
Potassium: 3.9 mmol/L (ref 3.5–5.1)

## 2012-08-19 LAB — MRSA PCR SCREENING

## 2012-08-19 LAB — CBC
HGB: 13.1 g/dL (ref 12.0–16.0)
MCH: 33.2 pg (ref 26.0–34.0)
MCV: 96 fL (ref 80–100)
Platelet: 352 10*3/uL (ref 150–440)
RBC: 3.94 10*6/uL (ref 3.80–5.20)
WBC: 18.6 10*3/uL — ABNORMAL HIGH (ref 3.6–11.0)

## 2012-08-19 LAB — APTT: Activated PTT: 29.4 secs (ref 23.6–35.9)

## 2012-08-21 HISTORY — PX: TOTAL KNEE ARTHROPLASTY: SHX125

## 2012-08-21 NOTE — Telephone Encounter (Signed)
plz fax letter in chart as well as last office visit.  Thanks.

## 2012-08-22 NOTE — Telephone Encounter (Signed)
Letter and notes faxed as directed.

## 2012-08-23 ENCOUNTER — Encounter: Payer: Self-pay | Admitting: Family Medicine

## 2012-08-23 MED ORDER — SIMVASTATIN 20 MG PO TABS: 20.0000 mg | ORAL_TABLET | Freq: Every day | ORAL | Status: DC

## 2012-08-23 MED ORDER — ZIPRASIDONE HCL 40 MG PO CAPS: 40.0000 mg | ORAL_CAPSULE | Freq: Two times a day (BID) | ORAL | Status: DC

## 2012-08-23 MED ORDER — GLIMEPIRIDE 4 MG PO TABS: 4.0000 mg | ORAL_TABLET | Freq: Every day | ORAL | Status: DC

## 2012-08-23 MED ORDER — LISINOPRIL 5 MG PO TABS: 5.0000 mg | ORAL_TABLET | Freq: Every day | ORAL | Status: DC

## 2012-08-23 NOTE — Telephone Encounter (Signed)
Dr. Reece Agar, all meds sent in except for geodon.

## 2012-08-23 NOTE — Telephone Encounter (Signed)
Sent in geodon.

## 2012-08-27 ENCOUNTER — Inpatient Hospital Stay: Payer: Self-pay | Admitting: Specialist

## 2012-08-27 ENCOUNTER — Ambulatory Visit: Payer: Medicare Other | Admitting: Cardiovascular Disease

## 2012-08-27 LAB — CREATININE, SERUM
Creatinine: 1 mg/dL (ref 0.60–1.30)
EGFR (African American): >60
EGFR (Non-African Amer.): >60

## 2012-08-28 LAB — BASIC METABOLIC PANEL
Anion Gap: 11 (ref 7–16)
BUN: 11 mg/dL (ref 7–18)
EGFR (African American): >60
Glucose: 179 mg/dL — ABNORMAL HIGH (ref 65–99)
Osmolality: 265 (ref 275–301)
Sodium: 130 mmol/L — ABNORMAL LOW (ref 136–145)

## 2012-08-29 LAB — BASIC METABOLIC PANEL
Anion Gap: 10 (ref 7–16)
BUN: 8 mg/dL (ref 7–18)
Chloride: 94 mmol/L — ABNORMAL LOW (ref 98–107)
Creatinine: 0.69 mg/dL (ref 0.60–1.30)
EGFR (African American): >60
EGFR (Non-African Amer.): >60
Glucose: 191 mg/dL — ABNORMAL HIGH (ref 65–99)
Osmolality: 261 (ref 275–301)
Potassium: 3.7 mmol/L (ref 3.5–5.1)
Sodium: 128 mmol/L — ABNORMAL LOW (ref 136–145)

## 2012-08-29 LAB — URINALYSIS, COMPLETE
Glucose,UR: NEGATIVE mg/dL (ref 0–75)
Hyaline Cast: 1
Ketone: NEGATIVE
Leukocyte Esterase: NEGATIVE
Nitrite: NEGATIVE
Protein: NEGATIVE
Specific Gravity: 1.009 (ref 1.003–1.030)
Squamous Epithelial: 1

## 2012-08-29 LAB — CBC WITH DIFFERENTIAL/PLATELET
Eosinophil %: 0.9 %
HCT: 33.3 % — ABNORMAL LOW (ref 35.0–47.0)
HGB: 11.8 g/dL — ABNORMAL LOW (ref 12.0–16.0)
Lymphocyte #: 1.9 10*3/uL (ref 1.0–3.6)
MCH: 33.4 pg (ref 26.0–34.0)
MCHC: 35.3 g/dL (ref 32.0–36.0)
Monocyte #: 1.2 x10 3/mm — ABNORMAL HIGH (ref 0.2–0.9)
Monocyte %: 6.9 %
Neutrophil %: 81.2 %
Platelet: 282 10*3/uL (ref 150–440)
RBC: 3.53 10*6/uL — ABNORMAL LOW (ref 3.80–5.20)
RDW: 13.1 % (ref 11.5–14.5)
WBC: 17.8 10*3/uL — ABNORMAL HIGH (ref 3.6–11.0)

## 2012-08-30 LAB — CBC WITH DIFFERENTIAL/PLATELET
Basophil %: 0.4 %
Eosinophil #: 0.1 10*3/uL (ref 0.0–0.7)
Eosinophil %: 0.7 %
HGB: 10.6 g/dL — ABNORMAL LOW (ref 12.0–16.0)
Lymphocyte #: 1.9 10*3/uL (ref 1.0–3.6)
Lymphocyte %: 9.9 %
MCH: 31.3 pg (ref 26.0–34.0)
MCHC: 33.1 g/dL (ref 32.0–36.0)
MCV: 95 fL (ref 80–100)
Neutrophil #: 15.6 10*3/uL — ABNORMAL HIGH (ref 1.4–6.5)
Neutrophil %: 82.7 %
Platelet: 292 10*3/uL (ref 150–440)
RDW: 13.4 % (ref 11.5–14.5)
WBC: 18.9 10*3/uL — ABNORMAL HIGH (ref 3.6–11.0)

## 2012-08-30 LAB — BASIC METABOLIC PANEL
Anion Gap: 7 (ref 7–16)
Co2: 25 mmol/L (ref 21–32)
Creatinine: 0.75 mg/dL (ref 0.60–1.30)
EGFR (African American): >60
EGFR (Non-African Amer.): >60
Glucose: 182 mg/dL — ABNORMAL HIGH (ref 65–99)
Osmolality: 272 (ref 275–301)
Sodium: 134 mmol/L — ABNORMAL LOW (ref 136–145)

## 2012-09-11 ENCOUNTER — Ambulatory Visit: Payer: Medicare Other | Admitting: Family Medicine

## 2012-09-13 ENCOUNTER — Encounter: Payer: Self-pay | Admitting: Family Medicine

## 2012-09-13 ENCOUNTER — Ambulatory Visit (INDEPENDENT_AMBULATORY_CARE_PROVIDER_SITE_OTHER): Payer: Medicare Other | Admitting: Family Medicine

## 2012-09-13 VITALS — BP 118/70 | HR 110 | Temp 97.6°F | Wt 270.2 lb

## 2012-09-13 DIAGNOSIS — E1165 Type 2 diabetes mellitus with hyperglycemia: Secondary | ICD-10-CM

## 2012-09-13 DIAGNOSIS — IMO0001 Reserved for inherently not codable concepts without codable children: Secondary | ICD-10-CM

## 2012-09-13 DIAGNOSIS — R0600 Dyspnea, unspecified: Secondary | ICD-10-CM

## 2012-09-13 DIAGNOSIS — E039 Hypothyroidism, unspecified: Secondary | ICD-10-CM

## 2012-09-13 DIAGNOSIS — R000 Tachycardia, unspecified: Secondary | ICD-10-CM

## 2012-09-13 DIAGNOSIS — R0989 Other specified symptoms and signs involving the circulatory and respiratory systems: Secondary | ICD-10-CM

## 2012-09-13 MED ORDER — METFORMIN HCL 500 MG PO TABS: ORAL_TABLET | ORAL | Status: DC

## 2012-09-13 MED ORDER — LEVOTHYROXINE SODIUM 100 MCG PO TABS: 100.0000 ug | ORAL_TABLET | Freq: Every day | ORAL | Status: DC

## 2012-09-13 MED ORDER — HYDROCHLOROTHIAZIDE 25 MG PO TABS: 25.0000 mg | ORAL_TABLET | Freq: Every day | ORAL | Status: DC

## 2012-09-13 MED ORDER — METOPROLOL TARTRATE 25 MG PO TABS: 25.0000 mg | ORAL_TABLET | Freq: Two times a day (BID) | ORAL | Status: DC

## 2012-09-13 NOTE — Assessment & Plan Note (Signed)
Reports improvement in CBGs with change in diet - weight loss noted so expect improved diabetic control at next blood work.

## 2012-09-13 NOTE — Patient Instructions (Signed)
Check into metoprolol 25mg  twice daily or toprol xl 25mg  once daily (pricing). Stop triamterene/hctz water pill. Start only hctz 25mg  daily and metoprolol. I've sent in hctz 25mg  daily as well as metoprolol 25mg  twice daily. Good to see you today, good job with weight loss!  Keep it up.

## 2012-09-13 NOTE — Assessment & Plan Note (Signed)
Notes improvement in SOB with weight loss.

## 2012-09-13 NOTE — Assessment & Plan Note (Addendum)
Anticipate related to pain, obesity/deconditioning. Will change anti hypertensive regimen - change from maxzide to only hctz 25mg  daily, start low dose metoprolol, and continue low dose lisinopril. Asked her to price out metoprolol tartrate vs succinate.  Sent in tartrate BID dosing. With noted weight loss, check TSH next month with blood work. Lab Results  Component Value Date   TSH 3.29 03/11/2012

## 2012-09-13 NOTE — Progress Notes (Signed)
  Subjective:    Patient ID: [name], female    DOB: 1950/10/31, 62 y.o.   MRN: [id number]  HPI CC: tachycardia  L TKR 08/27/2012.  Healing very well from this.  Feels very satisfied with this.  Good pain control.  Has f/u appt with ortho in 3 wks, Dr. Katrinka Blazing.  HHPT 3x/wk.  Working on straightening out leg.  Presents today at physical therapist's request - noted tachycardia at home as well as at hospital, with exertion.  Notes heart rate elevated when PT comes out to work on knee.  + pain with PT, wonders if related.  One elevated bp reading to 130s/116, otherwise good readings.  Prior on b blocker, this was stopped to start ACEI given h/o DM  Wt Readings from Last 3 Encounters:  09/13/12 270 lb 4 oz (122.585 kg)  08/08/12 285 lb 4 oz (129.389 kg)  07/09/12 286 lb 4 oz (129.842 kg)  noted 15lb weight loss!  diet changes - whole grain/wheat.  Sugars running well.  Past Medical History  Diagnosis Date  . Migraine   . GERD (gastroesophageal reflux disease)   . Anxiety   . Chronic bronchitis   . Hypertension   . Bipolar disorder   . Coronary artery disease, non-occlusive   . Hypothyroidism   . Fibromyalgia   . OSA (obstructive sleep apnea) 03/16/2011  . History of chicken pox   . HLD (hyperlipidemia)   . Urinary incontinence   . T2DM (type 2 diabetes mellitus) 2012    DM education 06/2011  . Dyspnea 2012    s/p pulm/cards w/u WNL, thought due to obesity/deconditioning    Past Surgical History  Procedure Date  . Total abdominal hysterectomy 2004    fibroids  . Knee surgery 2006    left, torn meniscus  . Cardiac catherization 03/2011    x3 [facility]), mild nonobstructive CAD  . Esophagogastroduodenoscopy 2006  . Colonoscopy 12/2011    hyperplastic polyp x2,. diverticulosis, rec rpt 10 yrs    Review of Systems Per HPI    Objective:   Physical Exam  Nursing note and vitals reviewed. Constitutional: She appears well-developed and well-nourished. No distress.   obese  Cardiovascular: Regular rhythm, normal heart sounds and intact distal pulses.  Tachycardia present.   No murmur heard. Pulmonary/Chest: Effort normal and breath sounds normal. No respiratory distress. She has no wheezes. She has no rales.  Musculoskeletal: She exhibits no edema.       Left knee vertical incision c/d/i, no erythema or induration or drainage       Assessment & Plan:

## 2012-09-30 ENCOUNTER — Encounter: Payer: Self-pay | Admitting: Family Medicine

## 2012-09-30 ENCOUNTER — Other Ambulatory Visit: Payer: Self-pay | Admitting: *Deleted

## 2012-09-30 MED ORDER — SITAGLIPTIN PHOSPHATE 100 MG PO TABS: 100.0000 mg | ORAL_TABLET | Freq: Every day | ORAL | Status: DC

## 2012-09-30 MED ORDER — GLUCOSE BLOOD VI STRP: ORAL_STRIP | Status: DC

## 2012-09-30 NOTE — Telephone Encounter (Signed)
Sent in Rx's as requested.

## 2012-10-01 ENCOUNTER — Encounter: Payer: Self-pay | Admitting: Family Medicine

## 2012-10-03 ENCOUNTER — Ambulatory Visit (HOSPITAL_COMMUNITY): Payer: Medicare Other | Admitting: Psychiatry

## 2012-10-07 ENCOUNTER — Telehealth: Payer: Self-pay | Admitting: *Deleted

## 2012-10-07 NOTE — Telephone Encounter (Signed)
Form for diabetic testing supplies in your IN box for completion. 

## 2012-10-08 NOTE — Telephone Encounter (Signed)
Signed and placed in my outbox.

## 2012-10-23 ENCOUNTER — Encounter (HOSPITAL_COMMUNITY): Payer: Self-pay | Admitting: Psychiatry

## 2012-10-23 ENCOUNTER — Ambulatory Visit (INDEPENDENT_AMBULATORY_CARE_PROVIDER_SITE_OTHER): Payer: Medicare PPO | Admitting: Psychiatry

## 2012-10-23 VITALS — BP 131/67 | HR 75

## 2012-10-23 DIAGNOSIS — F319 Bipolar disorder, unspecified: Secondary | ICD-10-CM

## 2012-10-23 DIAGNOSIS — F411 Generalized anxiety disorder: Secondary | ICD-10-CM
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
## 2012-11-05 ENCOUNTER — Ambulatory Visit (HOSPITAL_COMMUNITY): Payer: Medicare Other | Admitting: Psychiatry

## 2012-11-06 ENCOUNTER — Encounter: Payer: Self-pay | Admitting: Family Medicine

## 2012-11-06 ENCOUNTER — Ambulatory Visit (INDEPENDENT_AMBULATORY_CARE_PROVIDER_SITE_OTHER): Payer: Medicare PPO | Admitting: Family Medicine

## 2012-11-06 VITALS — BP 116/70 | HR 68 | Temp 97.6°F | Ht 64.0 in | Wt 259.5 lb

## 2012-11-06 DIAGNOSIS — R0600 Dyspnea, unspecified: Secondary | ICD-10-CM

## 2012-11-06 DIAGNOSIS — E1165 Type 2 diabetes mellitus with hyperglycemia: Secondary | ICD-10-CM

## 2012-11-06 DIAGNOSIS — E039 Hypothyroidism, unspecified: Secondary | ICD-10-CM

## 2012-11-06 DIAGNOSIS — R000 Tachycardia, unspecified: Secondary | ICD-10-CM

## 2012-11-06 DIAGNOSIS — F319 Bipolar disorder, unspecified: Secondary | ICD-10-CM

## 2012-11-06 DIAGNOSIS — I10 Essential (primary) hypertension: Secondary | ICD-10-CM

## 2012-11-06 DIAGNOSIS — IMO0001 Reserved for inherently not codable concepts without codable children: Secondary | ICD-10-CM

## 2012-11-06 DIAGNOSIS — R0989 Other specified symptoms and signs involving the circulatory and respiratory systems: Secondary | ICD-10-CM

## 2012-11-06 DIAGNOSIS — R0609 Other forms of dyspnea: Secondary | ICD-10-CM

## 2012-11-06 LAB — BASIC METABOLIC PANEL
BUN: 13 mg/dL (ref 6–23)
Calcium: 9.7 mg/dL (ref 8.4–10.5)
GFR: 71.5 mL/min (ref >60.00)
Potassium: 3.9 mEq/L (ref 3.5–5.1)

## 2012-11-06 LAB — HEMOGLOBIN A1C: Hgb A1c MFr Bld: 6.4 % (ref 4.6–6.5)

## 2012-11-06 MED ORDER — METOPROLOL TARTRATE 25 MG PO TABS: 12.5000 mg | ORAL_TABLET | Freq: Two times a day (BID) | ORAL | Status: DC

## 2012-11-06 NOTE — Progress Notes (Signed)
  Subjective:    Patient ID: [name], female    DOB: [birth year], 62 y.o.   MRN: [id number]  HPI CC: DM f/u  [name] presents today as 3 mo f/u.  DM - compliant with meds. Brings log of sugars showing fasting cbg's 120-170s, postprandial pm sugars 100-200s. No more 300s. Eating healthier.  No paresthesias.  Lab Results  Component Value Date   HGBA1C 7.6* 06/25/2012    HTN - No HA, vision changes, CP/tightness, SOB, leg swelling.    Bipolar with anxiety - started seeing Dr. Byrd Hesselbach at [facility].  Doing well.  To start seeing psychologist.  Obesity - weight down 21 lbs!  Working on portion sizes and diet changes.  No longer going to buffets, when eats out only eats 1/2 plate and saves rest for next meal. Wt Readings from Last 3 Encounters:  11/06/12 259 lb 8 oz (117.708 kg)  09/13/12 270 lb 4 oz (122.585 kg)  08/08/12 285 lb 4 oz (129.389 kg)   Past Medical History  Diagnosis Date  . Migraine   . GERD (gastroesophageal reflux disease)   . Anxiety   . Chronic bronchitis   . Hypertension   . Bipolar disorder   . Coronary artery disease, non-occlusive   . Hypothyroidism   . Fibromyalgia   . OSA (obstructive sleep apnea) 03/16/2011  . History of chicken pox   . HLD (hyperlipidemia)   . Urinary incontinence   . T2DM (type 2 diabetes mellitus) 2012    DM education 06/2011  . Dyspnea 2012    s/p pulm/cards w/u WNL, thought due to obesity/deconditioning    Review of Systems Per HPI    Objective:   Physical Exam  Nursing note and vitals reviewed. Constitutional: She appears well-developed and well-nourished. No distress.  Stiff movements from recent L knee surgery  HENT:  Head: Normocephalic and atraumatic.  Mouth/Throat: Oropharynx is clear and moist. No oropharyngeal exudate.  Neck: No thyromegaly present.  Cardiovascular: Normal rate, regular rhythm, normal heart sounds and intact distal pulses.   No murmur heard. Pulmonary/Chest: Effort normal and breath  sounds normal. No respiratory distress. She has no wheezes. She has no rales.  Musculoskeletal: She exhibits no edema.  Longitudinal scar L knee  Skin: Skin is warm and dry. No rash noted.  Psychiatric: She has a normal mood and affect.      Assessment & Plan:

## 2012-11-06 NOTE — Assessment & Plan Note (Signed)
Improved with weight loss 

## 2012-11-06 NOTE — Assessment & Plan Note (Signed)
Chronic, stable. Continue meds.  Great control with weight loss.  Decrease metoprolol

## 2012-11-06 NOTE — Assessment & Plan Note (Signed)
Improved with B blocker - but will decrease metoprolol today to 12.5mg  bid.

## 2012-11-06 NOTE — Patient Instructions (Signed)
Blood work today. Let's decrease metoprolol to 1/2 pill twice daily. Keep up the good work with weight!  Blood pressure and blood sugars are much better controlled.

## 2012-11-06 NOTE — Assessment & Plan Note (Signed)
Chronic. Great improvement in numbers with weight loss! Anticipate good control based on log of sugars she brings. Check A1c today.

## 2012-11-06 NOTE — Assessment & Plan Note (Signed)
Has established with psych.

## 2012-11-06 NOTE — Assessment & Plan Note (Signed)
Lab Results  Component Value Date   TSH 3.29 03/11/2012  recheck today given recent weight loss

## 2012-11-07 ENCOUNTER — Encounter: Payer: Self-pay | Admitting: *Deleted

## 2012-11-20 ENCOUNTER — Encounter (HOSPITAL_COMMUNITY): Payer: Self-pay | Admitting: Psychiatry

## 2012-11-20 ENCOUNTER — Ambulatory Visit (INDEPENDENT_AMBULATORY_CARE_PROVIDER_SITE_OTHER): Payer: Medicare PPO | Admitting: Psychiatry

## 2012-11-20 VITALS — BP 106/62 | HR 92 | Wt 261.0 lb

## 2012-11-20 DIAGNOSIS — F319 Bipolar disorder, unspecified: Secondary | ICD-10-CM
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
## 2012-11-26 ENCOUNTER — Ambulatory Visit (HOSPITAL_COMMUNITY): Payer: Self-pay | Admitting: Psychiatry

## 2013-01-17 ENCOUNTER — Other Ambulatory Visit: Payer: Self-pay | Admitting: Family Medicine

## 2013-02-18 LAB — HM DIABETES EYE EXAM

## 2013-02-19 ENCOUNTER — Ambulatory Visit (HOSPITAL_COMMUNITY): Payer: Self-pay | Admitting: Psychiatry

## 2013-02-25 ENCOUNTER — Encounter: Payer: Self-pay | Admitting: Family Medicine

## 2013-02-25 ENCOUNTER — Ambulatory Visit (INDEPENDENT_AMBULATORY_CARE_PROVIDER_SITE_OTHER): Payer: Medicare PPO | Admitting: Family Medicine

## 2013-02-25 VITALS — BP 124/74 | HR 88 | Temp 97.7°F | Wt 242.5 lb

## 2013-02-25 DIAGNOSIS — H9190 Unspecified hearing loss, unspecified ear: Secondary | ICD-10-CM | POA: Insufficient documentation

## 2013-02-25 DIAGNOSIS — H6120 Impacted cerumen, unspecified ear: Secondary | ICD-10-CM

## 2013-02-25 DIAGNOSIS — E1165 Type 2 diabetes mellitus with hyperglycemia: Secondary | ICD-10-CM

## 2013-02-25 DIAGNOSIS — H918X9 Other specified hearing loss, unspecified ear: Secondary | ICD-10-CM

## 2013-02-25 DIAGNOSIS — IMO0001 Reserved for inherently not codable concepts without codable children: Secondary | ICD-10-CM

## 2013-02-25 DIAGNOSIS — H6122 Impacted cerumen, left ear: Secondary | ICD-10-CM | POA: Insufficient documentation

## 2013-02-25 NOTE — Patient Instructions (Addendum)
Good to see you today. Irrigation of both ears performed today. Try to avoid use of qtips and rather use dilute hydrogen peroxide with water - a few drops every 2-3 weeks in each ear. Blood work today to check diabetes control.  Continue meds as up to now. Cancel appt later this month, reschedule f/u in 3-4 months.

## 2013-02-25 NOTE — Progress Notes (Signed)
  Subjective:    Patient ID: [name], female    DOB: 1950/10/04, 62 y.o.   MRN: [id number]  HPI CC: trouble hearing out of left ear  Left ear - was cleaning left ear with q-tip - suddenly had trouble hearing out of left ear.  Some discomfort of left ear.  Has been using peroxide to no avail. Denies fevers/chills, drainage from ears, congestion or other viral sxs, recent swimming.  DM - Ran out of januvia from 5/29 until 6/23.  Sugars elevated for that month, now some improved but still higher than previously.  Brings log showing fasting 130-170s, after donner 120-230.  Has eye exam scheduled for today.  No paresthesias.  No low sugars or hypoglycemic sxs.  Lab Results  Component Value Date   HGBA1C 6.4 11/06/2012   Past Medical History  Diagnosis Date  . Migraine   . GERD (gastroesophageal reflux disease)   . Anxiety   . Chronic bronchitis   . Hypertension   . Bipolar disorder   . Coronary artery disease, non-occlusive   . Hypothyroidism   . Fibromyalgia   . OSA (obstructive sleep apnea) 03/16/2011  . History of chicken pox   . HLD (hyperlipidemia)   . Urinary incontinence   . T2DM (type 2 diabetes mellitus) 2012    DM education 06/2011  . Dyspnea 2012    s/p pulm/cards w/u WNL, thought due to obesity/deconditioning    Review of Systems Per HPI    Objective:   Physical Exam  Nursing note and vitals reviewed. Constitutional: She appears well-developed and well-nourished. No distress.  obese  HENT:  Head: Normocephalic and atraumatic.  Right Ear: Hearing, external ear and ear canal normal.  Left Ear: External ear and ear canal normal. Decreased hearing is noted.  Mouth/Throat: Oropharynx is clear and moist. No oropharyngeal exudate.  Cerumen impaction bilaterally  Eyes: Conjunctivae and EOM are normal. Pupils are equal, round, and reactive to light. No scleral icterus.  Neck: Normal range of motion. Neck supple.  Cardiovascular: Normal rate, regular rhythm, normal  heart sounds and intact distal pulses.   No murmur heard. Pulmonary/Chest: Effort normal and breath sounds normal. No respiratory distress. She has no wheezes. She has no rales.  Musculoskeletal:  Diabetic foot exam: Normal inspection No skin breakdown No calluses  Normal DP/PT pulses Normal sensation to light touch and monofilament Nails normal  Lymphadenopathy:    She has no cervical adenopathy.       Assessment & Plan:

## 2013-02-25 NOTE — Assessment & Plan Note (Addendum)
Likely due to qtip use. Recommended against this. Irrigation performed today. Afterwards, able to visualize L TM.  Improved hearing.

## 2013-02-25 NOTE — Assessment & Plan Note (Signed)
Controlled as of last check, however anticipate deteriorated control over last few months. Recheck today. Wt Readings from Last 3 Encounters:  02/25/13 242 lb 8 oz (109.997 kg)  11/20/12 261 lb (118.389 kg)  11/06/12 259 lb 8 oz (117.708 kg)

## 2013-03-10 ENCOUNTER — Ambulatory Visit: Payer: Self-pay | Admitting: Family Medicine

## 2013-03-26 ENCOUNTER — Other Ambulatory Visit: Payer: Self-pay

## 2013-04-23 ENCOUNTER — Other Ambulatory Visit: Payer: Self-pay

## 2013-04-23 DIAGNOSIS — Z1231 Encounter for screening mammogram for malignant neoplasm of breast: Secondary | ICD-10-CM

## 2013-05-21 ENCOUNTER — Ambulatory Visit
Admission: RE | Admit: 2013-05-21 | Discharge: 2013-05-21 | Disposition: A | Discharge status: Home / Self Care | Payer: Commercial Managed Care - HMO | Source: Ambulatory Visit

## 2013-05-21 DIAGNOSIS — Z1231 Encounter for screening mammogram for malignant neoplasm of breast: Secondary | ICD-10-CM

## 2013-05-21 LAB — HM MAMMOGRAPHY: HM Mammogram: NORMAL

## 2013-05-22 ENCOUNTER — Encounter: Payer: Self-pay | Admitting: *Deleted

## 2013-06-16 ENCOUNTER — Ambulatory Visit: Payer: Self-pay | Admitting: Family Medicine

## 2013-06-24 ENCOUNTER — Other Ambulatory Visit: Payer: Self-pay | Admitting: Family Medicine

## 2013-06-26 ENCOUNTER — Other Ambulatory Visit: Payer: Self-pay

## 2013-07-14 ENCOUNTER — Ambulatory Visit (INDEPENDENT_AMBULATORY_CARE_PROVIDER_SITE_OTHER): Payer: Medicare PPO | Admitting: Family Medicine

## 2013-07-14 ENCOUNTER — Encounter: Payer: Self-pay | Admitting: Family Medicine

## 2013-07-14 VITALS — BP 126/84 | HR 88 | Temp 97.5°F | Wt 269.5 lb

## 2013-07-14 DIAGNOSIS — M1710 Unilateral primary osteoarthritis, unspecified knee: Secondary | ICD-10-CM

## 2013-07-14 DIAGNOSIS — E1165 Type 2 diabetes mellitus with hyperglycemia: Secondary | ICD-10-CM

## 2013-07-14 DIAGNOSIS — M1712 Unilateral primary osteoarthritis, left knee: Secondary | ICD-10-CM

## 2013-07-14 DIAGNOSIS — I10 Essential (primary) hypertension: Secondary | ICD-10-CM

## 2013-07-14 DIAGNOSIS — IMO0001 Reserved for inherently not codable concepts without codable children: Secondary | ICD-10-CM

## 2013-07-14 DIAGNOSIS — E785 Hyperlipidemia, unspecified: Secondary | ICD-10-CM

## 2013-07-14 DIAGNOSIS — IMO0002 Reserved for concepts with insufficient information to code with codable children: Secondary | ICD-10-CM

## 2013-07-14 DIAGNOSIS — Z23 Encounter for immunization: Secondary | ICD-10-CM

## 2013-07-14 DIAGNOSIS — E039 Hypothyroidism, unspecified: Secondary | ICD-10-CM

## 2013-07-14 LAB — LIPID PANEL
Cholesterol: 144 mg/dL (ref 0–200)
Total CHOL/HDL Ratio: 4
Triglycerides: 274 mg/dL — ABNORMAL HIGH (ref 0.0–149.0)
VLDL: 54.8 mg/dL — ABNORMAL HIGH (ref 0.0–40.0)

## 2013-07-14 LAB — HEMOGLOBIN A1C: Hgb A1c MFr Bld: 7.1 % — ABNORMAL HIGH (ref 4.6–6.5)

## 2013-07-14 LAB — BASIC METABOLIC PANEL
CO2: 27 mEq/L (ref 19–32)
Calcium: 9.6 mg/dL (ref 8.4–10.5)
Creatinine, Ser: 0.8 mg/dL (ref 0.4–1.2)
GFR: 82.28 mL/min (ref >60.00)
Sodium: 137 mEq/L (ref 135–145)

## 2013-07-14 LAB — TSH: TSH: 2.62 u[IU]/mL (ref 0.35–5.50)

## 2013-07-14 LAB — LDL CHOLESTEROL, DIRECT: Direct LDL: 80.8 mg/dL

## 2013-07-14 MED ORDER — NAPROXEN 500 MG PO TABS: ORAL_TABLET | ORAL | Status: DC

## 2013-07-14 MED ORDER — GLIMEPIRIDE 4 MG PO TABS: 8.0000 mg | ORAL_TABLET | Freq: Every day | ORAL | Status: DC

## 2013-07-14 NOTE — Progress Notes (Signed)
Pre-visit discussion using our clinic review tool. No additional management support is needed unless otherwise documented below in the visit note.  

## 2013-07-14 NOTE — Progress Notes (Signed)
  Subjective:    Patient ID: [name], female    DOB: 14-Mar-1951, 62 y.o.   MRN: [id number]  HPI CC: 4 mo DM f/u  DM - brings log of sugars : fasting 140-160, after dinner 150-200s, isolated highs to 300.  Checks sugars BID.  No low sugars or hypoglycemic sxs.  Last eye exam 02/2013. Foot exam done 02/2013  Lab Results  Component Value Date   HGBA1C 6.9* 02/25/2013    Having worsening knee pain - left knee at prior site of replacement.  Per ortho knee replacement is doing fine.  Taking 1 aleve OTC BID for pain.    Past Medical History  Diagnosis Date  . Migraine   . GERD (gastroesophageal reflux disease)   . Anxiety   . Chronic bronchitis   . Hypertension   . Bipolar disorder   . Coronary artery disease, non-occlusive   . Hypothyroidism   . Fibromyalgia   . OSA (obstructive sleep apnea) 03/16/2011  . History of chicken pox   . HLD (hyperlipidemia)   . Urinary incontinence   . T2DM (type 2 diabetes mellitus) 2012    DM education 06/2011  . Dyspnea 2012    s/p pulm/cards w/u WNL, thought due to obesity/deconditioning    Review of Systems Per HPI    Objective:   Physical Exam  Nursing note and vitals reviewed. Constitutional: She appears well-developed and well-nourished. No distress.  Cardiovascular: Normal rate, regular rhythm, normal heart sounds and intact distal pulses.   No murmur heard. Pulmonary/Chest: Effort normal and breath sounds normal. No respiratory distress. She has no wheezes. She has no rales.  Musculoskeletal: She exhibits no edema.  Tender at R knee anteriorly at patellar ligament. Clean dry scar present  Skin: Skin is warm and dry. No rash noted.       Assessment & Plan:

## 2013-07-14 NOTE — Assessment & Plan Note (Signed)
Staying controlled based on A1c but out of control based on log of sugars she brings. Will continue [place] 100mg  daily and metformin 1000mg  bid, and increase glimiperide to 8mg  daily. rtc 4-5 mo for f/u.

## 2013-07-14 NOTE — Assessment & Plan Note (Signed)
Chronic, stable. Continue meds. BP Readings from Last 3 Encounters:  07/14/13 126/84  02/25/13 124/74  11/20/12 106/62

## 2013-07-14 NOTE — Assessment & Plan Note (Signed)
S/p TKR on left. Now pain again deteriorating.  S/p unrevealing ortho eval Will start stronger naprosyn for temporary course.

## 2013-07-14 NOTE — Assessment & Plan Note (Signed)
Check TSH today

## 2013-07-14 NOTE — Patient Instructions (Addendum)
Flu shot today. Sugars are a bit elevated - let's increase glimiperide to 8mg  daily.  New script is at pharmacy. Try naprosyn for knee pain - take twice daily with food for 5 days then as needed.  Don't take with aleve (same medicine) Blood work today. Good to see you today, call us with questions. Return in 4-5 months for wellness exam.

## 2013-07-16 ENCOUNTER — Encounter: Payer: Self-pay | Admitting: Family Medicine

## 2013-07-16 MED ORDER — SITAGLIPTIN PHOSPHATE 100 MG PO TABS: 100.0000 mg | ORAL_TABLET | Freq: Every day | ORAL | Status: DC

## 2013-07-21 ENCOUNTER — Encounter: Payer: Self-pay | Admitting: Family Medicine

## 2013-07-23 MED ORDER — METFORMIN HCL 500 MG PO TABS: ORAL_TABLET | ORAL | Status: DC

## 2013-07-23 MED ORDER — LEVOTHYROXINE SODIUM 100 MCG PO TABS: ORAL_TABLET | ORAL | Status: DC

## 2013-07-23 MED ORDER — HYDROCHLOROTHIAZIDE 25 MG PO TABS: ORAL_TABLET | ORAL | Status: DC

## 2013-07-23 MED ORDER — ZIPRASIDONE HCL 40 MG PO CAPS: ORAL_CAPSULE | ORAL | Status: DC

## 2013-09-03 ENCOUNTER — Other Ambulatory Visit: Payer: Self-pay | Admitting: Family Medicine

## 2013-10-01 ENCOUNTER — Other Ambulatory Visit: Payer: Self-pay | Admitting: Family Medicine

## 2013-11-27 ENCOUNTER — Other Ambulatory Visit: Payer: Self-pay

## 2013-12-03 ENCOUNTER — Other Ambulatory Visit: Payer: Self-pay | Admitting: Family Medicine

## 2013-12-03 DIAGNOSIS — E785 Hyperlipidemia, unspecified: Secondary | ICD-10-CM

## 2013-12-03 DIAGNOSIS — I10 Essential (primary) hypertension: Secondary | ICD-10-CM

## 2013-12-03 DIAGNOSIS — IMO0002 Reserved for concepts with insufficient information to code with codable children: Secondary | ICD-10-CM

## 2013-12-03 DIAGNOSIS — E039 Hypothyroidism, unspecified: Secondary | ICD-10-CM

## 2013-12-03 DIAGNOSIS — E1165 Type 2 diabetes mellitus with hyperglycemia: Secondary | ICD-10-CM

## 2013-12-08 ENCOUNTER — Other Ambulatory Visit (INDEPENDENT_AMBULATORY_CARE_PROVIDER_SITE_OTHER): Payer: Commercial Managed Care - HMO

## 2013-12-08 DIAGNOSIS — IMO0002 Reserved for concepts with insufficient information to code with codable children: Secondary | ICD-10-CM

## 2013-12-08 DIAGNOSIS — E785 Hyperlipidemia, unspecified: Secondary | ICD-10-CM

## 2013-12-08 DIAGNOSIS — IMO0001 Reserved for inherently not codable concepts without codable children: Secondary | ICD-10-CM

## 2013-12-08 DIAGNOSIS — E1165 Type 2 diabetes mellitus with hyperglycemia: Secondary | ICD-10-CM

## 2013-12-08 DIAGNOSIS — E039 Hypothyroidism, unspecified: Secondary | ICD-10-CM

## 2013-12-08 LAB — LIPID PANEL
CHOLESTEROL: 148 mg/dL (ref 0–200)
HDL: 36.9 mg/dL — ABNORMAL LOW (ref >39.00)
LDL Cholesterol: 48 mg/dL (ref 0–99)
Total CHOL/HDL Ratio: 4
Triglycerides: 314 mg/dL — ABNORMAL HIGH (ref 0.0–149.0)
VLDL: 62.8 mg/dL — ABNORMAL HIGH (ref 0.0–40.0)

## 2013-12-08 LAB — COMPREHENSIVE METABOLIC PANEL
ALBUMIN: 4 g/dL (ref 3.5–5.2)
ALT: 25 U/L (ref 0–35)
AST: 25 U/L (ref 0–37)
Alkaline Phosphatase: 55 U/L (ref 39–117)
BUN: 16 mg/dL (ref 6–23)
CO2: 22 mEq/L (ref 19–32)
Calcium: 9.6 mg/dL (ref 8.4–10.5)
Chloride: 102 mEq/L (ref 96–112)
Creatinine, Ser: 0.8 mg/dL (ref 0.4–1.2)
GFR: 78.58 mL/min (ref >60.00)
Glucose, Bld: 171 mg/dL — ABNORMAL HIGH (ref 70–99)
POTASSIUM: 4 meq/L (ref 3.5–5.1)
SODIUM: 141 meq/L (ref 135–145)
TOTAL PROTEIN: 8.1 g/dL (ref 6.0–8.3)
Total Bilirubin: 0.6 mg/dL (ref 0.3–1.2)

## 2013-12-08 LAB — MICROALBUMIN / CREATININE URINE RATIO
Creatinine,U: 108.2 mg/dL
Microalb Creat Ratio: 11.6 mg/g (ref 0.0–30.0)
Microalb, Ur: 12.6 mg/dL — ABNORMAL HIGH (ref 0.0–1.9)

## 2013-12-08 LAB — TSH: TSH: 3.37 u[IU]/mL (ref 0.35–5.50)

## 2013-12-08 LAB — HEMOGLOBIN A1C: Hgb A1c MFr Bld: 7.3 % — ABNORMAL HIGH (ref 4.6–6.5)

## 2013-12-15 ENCOUNTER — Encounter: Payer: Self-pay | Admitting: Family Medicine

## 2013-12-15 ENCOUNTER — Other Ambulatory Visit (HOSPITAL_COMMUNITY)
Admission: RE | Admit: 2013-12-15 | Discharge: 2013-12-15 | Disposition: A | Discharge status: Home / Self Care | Payer: Medicare PPO | Source: Ambulatory Visit | Attending: Family Medicine | Admitting: Family Medicine

## 2013-12-15 ENCOUNTER — Ambulatory Visit (INDEPENDENT_AMBULATORY_CARE_PROVIDER_SITE_OTHER): Payer: Commercial Managed Care - HMO | Admitting: Family Medicine

## 2013-12-15 VITALS — BP 124/80 | HR 88 | Temp 98.1°F | Ht 64.0 in | Wt 263.5 lb

## 2013-12-15 DIAGNOSIS — Z124 Encounter for screening for malignant neoplasm of cervix: Secondary | ICD-10-CM | POA: Insufficient documentation

## 2013-12-15 DIAGNOSIS — IMO0001 Reserved for inherently not codable concepts without codable children: Secondary | ICD-10-CM

## 2013-12-15 DIAGNOSIS — E785 Hyperlipidemia, unspecified: Secondary | ICD-10-CM

## 2013-12-15 DIAGNOSIS — IMO0002 Reserved for concepts with insufficient information to code with codable children: Secondary | ICD-10-CM

## 2013-12-15 DIAGNOSIS — M1710 Unilateral primary osteoarthritis, unspecified knee: Secondary | ICD-10-CM

## 2013-12-15 DIAGNOSIS — E1165 Type 2 diabetes mellitus with hyperglycemia: Secondary | ICD-10-CM

## 2013-12-15 DIAGNOSIS — Z0000 Encounter for general adult medical examination without abnormal findings: Secondary | ICD-10-CM

## 2013-12-15 DIAGNOSIS — M1712 Unilateral primary osteoarthritis, left knee: Secondary | ICD-10-CM

## 2013-12-15 LAB — HM PAP SMEAR: HM Pap smear: NORMAL

## 2013-12-15 MED ORDER — INSULIN PEN NEEDLE 30G X 8 MM MISC: 1.0000 | Status: DC | PRN

## 2013-12-15 MED ORDER — INSULIN GLARGINE 100 UNIT/ML SOLOSTAR PEN
10.0000 [IU] | PEN_INJECTOR | Freq: Every day | SUBCUTANEOUS | Status: DC
Start: 2013-12-15 — End: 2014-03-24

## 2013-12-15 NOTE — Progress Notes (Signed)
Pre visit review using our clinic review tool, if applicable. No additional management support is needed unless otherwise documented below in the visit note. 

## 2013-12-15 NOTE — Progress Notes (Signed)
BP 124/80  Pulse 88  Temp(Src) 98.1 F (36.7 C) (Oral)  Ht 5\' 4"  (1.626 m)  Wt 263 lb 8 oz (119.523 kg)  BMI 45.21 kg/m2   CC: medicare wellness visit  Subjective:    Patient ID: [name], female    DOB: 1951-07-17, 63 y.o.   MRN: [id number]  HPI: [name] is a 63 y.o. female presenting on 12/15/2013 for Annual Exam   DM - regularly does check sugars twice daily 170-180 fasting, 200s pm.  Compliant with antihyperglycemic regimen which includes: metformin 1000mg  bid, januvia 100mg  and amaryl 8mg  daily.  Denies low sugars or hypoglycemic symptoms.  Denies paresthesias. Last diabetic eye exam 02/2013.  Pneumovax: 2012.  Prevnar: not done yet.  HLD - complaint with simvastatin.  No myalgias.  Having trouble after L knee replacement.  Hearing screen and vision screens passed. No falls PHQ9 = 12.  H/o bipolar on geodon.  Has not seen local psychiatrist.  Prior saw [facility].  Preventative: Mammogram WNL 05/2013.  Breast exam today. No recent well woman exam, last 2012 normal.  Pap 2012 WNL.  S/p total hysterectomy with bilat oophorectomy but cervix remains (fibroids).  Bladder sling placed at that time. Flu 2014 Pneumovax 2012 Td 2012 Shingles shot - declines 2/2 cost.   Advanced directives: does not have set in place.  Packet provided.  Relevant past medical, surgical, family and social history reviewed and updated as indicated.  Allergies and medications reviewed and updated. Current Outpatient Prescriptions on File Prior to Visit  Medication Sig  . ACCU-CHEK FASTCLIX LANCETS MISC   . aspirin EC 81 MG tablet Take 162 mg by mouth daily.  . Cimetidine (ACID REDUCER PO) Take 1 tablet by mouth daily.  Marland Kitchen glimepiride (AMARYL) 4 MG tablet Take 2 tablets (8 mg total) by mouth daily with breakfast.  . glucose blood (ONE TOUCH ULTRA TEST) test strip Check blood sugar twice daily and as directed.250.02  . hydrochlorothiazide (HYDRODIURIL) 25 MG tablet TAKE 1 TABLET EVERY DAY   . levothyroxine (SYNTHROID, LEVOTHROID) 100 MCG tablet TAKE 1 TABLET EVERY DAY  . lisinopril (PRINIVIL,ZESTRIL) 5 MG tablet TAKE 1 TABLET EVERY DAY  . metFORMIN (GLUCOPHAGE) 500 MG tablet TAKE 2 TABLETS IN THE MORNING AND 2 TABLETS AT NIGHT  . metoprolol tartrate (LOPRESSOR) 25 MG tablet TAKE 1/2 TABLET (12.5 MG) TWICE DAILY  . naproxen (NAPROSYN) 500 MG tablet Take one po bid x 5 days then prn pain, take with food  . simvastatin (ZOCOR) 20 MG tablet TAKE 1 TABLET AT BEDTIME  . sitaGLIPtin (JANUVIA) 100 MG tablet Take 1 tablet (100 mg total) by mouth daily.  . ziprasidone (GEODON) 40 MG capsule TAKE 1 CAPSULE TWICE DAILY  WITH  A  MEAL  . Cyanocobalamin (B-12 PO) Take by mouth daily.  . Multiple Vitamin (MULTIVITAMIN) tablet Take 1 tablet by mouth daily.   No current facility-administered medications on file prior to visit.    Review of Systems Per HPI unless specifically indicated above    Objective:    BP 124/80  Pulse 88  Temp(Src) 98.1 F (36.7 C) (Oral)  Ht 5\' 4"  (1.626 m)  Wt 263 lb 8 oz (119.523 kg)  BMI 45.21 kg/m2  Physical Exam  Nursing note and vitals reviewed. Constitutional: She is oriented to person, place, and time. She appears well-developed and well-nourished. No distress.  Obese Body mass index is 45.21 kg/(m^2).  Short winded with getting on exam table  HENT:  Head: Normocephalic and atraumatic.  Right Ear: Hearing, tympanic membrane, external ear and ear canal normal.  Left Ear: Hearing, tympanic membrane, external ear and ear canal normal.  Nose: Nose normal.  Mouth/Throat: Uvula is midline, oropharynx is clear and moist and mucous membranes are normal. No oropharyngeal exudate, posterior oropharyngeal edema or posterior oropharyngeal erythema.  Eyes: Conjunctivae and EOM are normal. Pupils are equal, round, and reactive to light. No scleral icterus.  Neck: Normal range of motion. Neck supple. Carotid bruit is not present. No thyromegaly present.    Cardiovascular: Normal rate, regular rhythm, normal heart sounds and intact distal pulses.   No murmur heard. Pulses:      Radial pulses are 2+ on the right side, and 2+ on the left side.  Pulmonary/Chest: Effort normal and breath sounds normal. No respiratory distress. She has no wheezes. She has no rales. Right breast exhibits no inverted nipple, no mass, no nipple discharge, no skin change and no tenderness. Left breast exhibits no inverted nipple, no mass, no nipple discharge, no skin change and no tenderness.  Abdominal: Soft. Bowel sounds are normal. She exhibits distension. She exhibits no mass. There is no tenderness. There is no rebound and no guarding.  Genitourinary: Vagina normal. Pelvic exam was performed with patient supine. There is no rash, tenderness, lesion or injury on the right labia. There is no rash, tenderness, lesion or injury on the left labia. Cervix exhibits no motion tenderness and no discharge.  Pap performed on cervix. Uterus and ovaries absent  Musculoskeletal: Normal range of motion. She exhibits no edema.  Lymphadenopathy:       Head (right side): No submental, no submandibular, no tonsillar, no preauricular and no posterior auricular adenopathy present.       Head (left side): No submental, no submandibular, no tonsillar, no preauricular and no posterior auricular adenopathy present.    She has no cervical adenopathy.    She has no axillary adenopathy.       Right axillary: No lateral adenopathy present.       Left axillary: No lateral adenopathy present.      Right: No supraclavicular adenopathy present.       Left: No supraclavicular adenopathy present.  Neurological: She is alert and oriented to person, place, and time.  CN grossly intact, station and gait intact  Skin: Skin is warm and dry. No rash noted.  Psychiatric: She has a normal mood and affect. Her behavior is normal. Judgment and thought content normal.   Results for orders placed in visit on  12/15/13  HM DIABETES EYE EXAM      Result Value Ref Range   HM Diabetic Eye Exam No Retinopathy  No Retinopathy      Assessment & Plan:   Problem List Items Addressed This Visit   Diabetes type 2, uncontrolled     Reviewed A1c which is lower than would be expected 2/2 recall cbgs.  Consider fructosamine next blood work. Continue oral meds - start lantus at 10u daily. RTC 3 mo for f/u.    Relevant Medications      LANTUS SOLOSTAR 100 UNIT/ML [place] SOLN   HLD (hyperlipidemia)     Chronic, stable. Continue med.  Tolerating statin well.    Osteoarthritis of left knee     Continues to limit activity despite L TKR    Medicare annual wellness visit, initial - Primary     I have personally reviewed the Medicare Annual Wellness questionnaire and have noted 1. The patient's medical and social history  2. Their use of alcohol, tobacco or illicit drugs 3. Their current medications and supplements 4. The patient's functional ability including ADL's, fall risks, home safety risks and hearing or visual impairment. 5. Diet and physical activity 6. Evidence for depression or mood disorders The patients weight, height, BMI have been recorded in the chart.  Hearing and vision has been addressed. I have made referrals, counseling and provided education to the patient based review of the above and I have provided the pt with a written personalized care plan for preventive services. See scanned questionairre. Advanced directives discussed: pamphlet provided.  Reviewed preventative protocols and updated unless pt declined. Pap/well woman with breast performed today.    Relevant Orders      Cytology - PAP       Follow up plan: Return in about 3 months (around 03/16/2014), or as needed, for follow up visit.

## 2013-12-15 NOTE — Assessment & Plan Note (Addendum)
I have personally reviewed the Medicare Annual Wellness questionnaire and have noted 1. The patient's medical and social history 2. Their use of alcohol, tobacco or illicit drugs 3. Their current medications and supplements 4. The patient's functional ability including ADL's, fall risks, home safety risks and hearing or visual impairment. 5. Diet and physical activity 6. Evidence for depression or mood disorders The patients weight, height, BMI have been recorded in the chart.  Hearing and vision has been addressed. I have made referrals, counseling and provided education to the patient based review of the above and I have provided the pt with a written personalized care plan for preventive services. See scanned questionairre. Advanced directives discussed: pamphlet provided.  Reviewed preventative protocols and updated unless pt declined. Pap/well woman with breast performed today.

## 2013-12-15 NOTE — Assessment & Plan Note (Signed)
Stable on geodon. Continue.

## 2013-12-15 NOTE — Patient Instructions (Addendum)
Advanced directive packet provided today. Pelvic exam/pap smear today. Breast exam today. Your sugars remain too high - I recommend starting lantus 10 units once daily.  May return here with Benjie Karvonen RN for insulin teaching or ask pharmacy for insulin teaching. Decrease amaryl to one tablet daily when you start lantus 10 units. Return to see me in 3 months for follow up , sooner if needed.

## 2013-12-15 NOTE — Assessment & Plan Note (Signed)
Chronic, stable. Continue med.  Tolerating statin well.

## 2013-12-15 NOTE — Assessment & Plan Note (Signed)
Chronic, stable. Continue levothyroxine. 

## 2013-12-15 NOTE — Assessment & Plan Note (Signed)
Chronic, stable. Continue meds. 

## 2013-12-15 NOTE — Assessment & Plan Note (Signed)
Continues to limit activity despite L TKR

## 2013-12-15 NOTE — Assessment & Plan Note (Signed)
Reviewed A1c which is lower than would be expected 2/2 recall cbgs.  Consider fructosamine next blood work. Continue oral meds - start lantus at 10u daily. RTC 3 mo for f/u.

## 2013-12-17 ENCOUNTER — Other Ambulatory Visit: Payer: Self-pay | Admitting: Family Medicine

## 2013-12-17 ENCOUNTER — Encounter: Payer: Self-pay | Admitting: *Deleted

## 2013-12-19 ENCOUNTER — Telehealth: Payer: Self-pay

## 2013-12-19 NOTE — Telephone Encounter (Signed)
Relevant patient education assigned to patient using Emmi. ° °

## 2013-12-24 ENCOUNTER — Telehealth: Payer: Self-pay

## 2013-12-24 NOTE — Telephone Encounter (Signed)
Pt left v/m requesting Dr Gutierrez's assistant to contact pt about training for insulin injections. AVS on 12/15/13 said pt could schedule with Tiburcio Pea or visit a pharmacy for insulin teaching.Please advise.

## 2013-12-24 NOTE — Telephone Encounter (Signed)
Spoke with patient and gave her the number for [facility] to set up a time for insulin instruction. She verbalized understanding and said she will call.

## 2013-12-29 ENCOUNTER — Encounter: Payer: Self-pay | Admitting: Family Medicine

## 2013-12-29 MED ORDER — ZOSTER VACCINE LIVE 19400 UNT/0.65ML ~~LOC~~ SOLR: 0.6500 mL | Freq: Once | SUBCUTANEOUS | Status: DC

## 2013-12-29 NOTE — Telephone Encounter (Signed)
Please see Mychart message.

## 2014-01-19 ENCOUNTER — Other Ambulatory Visit: Payer: Self-pay | Admitting: Family Medicine

## 2014-02-17 ENCOUNTER — Encounter: Payer: Self-pay | Admitting: Family Medicine

## 2014-02-17 NOTE — Telephone Encounter (Signed)
Referral placed.. Pt has appt July 14th

## 2014-03-03 LAB — HM DIABETES EYE EXAM

## 2014-03-11 ENCOUNTER — Other Ambulatory Visit: Payer: Self-pay | Admitting: Family Medicine

## 2014-03-16 ENCOUNTER — Encounter: Payer: Self-pay | Admitting: Family Medicine

## 2014-03-16 ENCOUNTER — Ambulatory Visit (INDEPENDENT_AMBULATORY_CARE_PROVIDER_SITE_OTHER): Payer: Commercial Managed Care - HMO | Admitting: Family Medicine

## 2014-03-16 VITALS — BP 130/70 | HR 96 | Temp 97.6°F | Wt 280.0 lb

## 2014-03-16 DIAGNOSIS — E1165 Type 2 diabetes mellitus with hyperglycemia: Secondary | ICD-10-CM

## 2014-03-16 DIAGNOSIS — IMO0002 Reserved for concepts with insufficient information to code with codable children: Secondary | ICD-10-CM

## 2014-03-16 DIAGNOSIS — I10 Essential (primary) hypertension: Secondary | ICD-10-CM

## 2014-03-16 DIAGNOSIS — E785 Hyperlipidemia, unspecified: Secondary | ICD-10-CM

## 2014-03-16 DIAGNOSIS — IMO0001 Reserved for inherently not codable concepts without codable children: Secondary | ICD-10-CM

## 2014-03-16 LAB — BASIC METABOLIC PANEL
BUN: 11 mg/dL (ref 6–23)
CALCIUM: 9.3 mg/dL (ref 8.4–10.5)
CO2: 27 mEq/L (ref 19–32)
Chloride: 100 mEq/L (ref 96–112)
Creatinine, Ser: 0.7 mg/dL (ref 0.4–1.2)
GFR: 84.66 mL/min (ref >60.00)
Glucose, Bld: 127 mg/dL — ABNORMAL HIGH (ref 70–99)
POTASSIUM: 4.1 meq/L (ref 3.5–5.1)
Sodium: 137 mEq/L (ref 135–145)

## 2014-03-16 LAB — HEMOGLOBIN A1C: Hgb A1c MFr Bld: 8.6 % — ABNORMAL HIGH (ref 4.6–6.5)

## 2014-03-16 MED ORDER — GLIMEPIRIDE 4 MG PO TABS: 4.0000 mg | ORAL_TABLET | Freq: Every day | ORAL | Status: DC

## 2014-03-16 NOTE — Progress Notes (Signed)
Pre visit review using our clinic review tool, if applicable. No additional management support is needed unless otherwise documented below in the visit note. 

## 2014-03-16 NOTE — Assessment & Plan Note (Signed)
Persistently uncontrolled diabetes despite addition of low dose lantus. Discussed slow taper up to max 30u daily, then call me with update on sugars. Continue other oral meds as up to now. RTC 3-4 mo f/u. Pt agrees with plan. Encouraged staying as active as tolerable.

## 2014-03-16 NOTE — Patient Instructions (Signed)
Watch weight  Check blood work today. Continue meds as up to now, slowly increase lantus by 2 units every 3 days if average fasting sugar >150 and post meal sugar >180 to a maximum of 30 units lantus. Call me with an update at 30 units daily and if still high we will discuss continued titration up.

## 2014-03-16 NOTE — Assessment & Plan Note (Signed)
Chronic, stable. Continue current regimen. Check FLP next fasting labwork.

## 2014-03-16 NOTE — Progress Notes (Signed)
BP 130/70  Pulse 96  Temp(Src) 97.6 F (36.4 C) (Oral)  Wt 280 lb (127.007 kg)   CC: 3 mo f/u  Subjective:    Patient ID: [name], female    DOB: [birth year], 63 y.o.   MRN: [id number]  HPI: KEVIA [name] is a 63 y.o. female presenting on 03/16/2014 for Follow-up   Obesity - no regular exercise 2/2 knee pain. Wt Readings from Last 3 Encounters:  03/16/14 280 lb (127.007 kg)  12/15/13 263 lb 8 oz (119.523 kg)  07/14/13 269 lb 8 oz (122.244 kg)  Body mass index is 48.04 kg/(m^2).  DM - brings log. regularly does check sugars twice daily 150-200 fasting, 180-300s pm. Compliant with antihyperglycemic regimen which includes: metformin 1000mg  bid, januvia 100mg  and amaryl 4mg  daily. Recent addition of lantus 10u daily in am. Denies low sugars or hypoglycemic symptoms. Denies paresthesias. Last diabetic eye exam 02/2014. Pneumovax: 2012. Prevnar: not done yet.   HLD - complaint with simvastatin. No myalgias.  HTN - Compliant with current antihypertensive regimen of lisinopril 5mg  daily, metoprolol 12.5mg  bid and hctz 25mg  daily.  Does not check blood pressures at home although she has bp cuff at home.  No low blood pressure symptoms of dizziness/syncope.  Denies HA, vision changes, CP/tightness, SOB, leg swelling.    Relevant past medical, surgical, family and social history reviewed and updated as indicated.  Allergies and medications reviewed and updated. Current Outpatient Prescriptions on File Prior to Visit  Medication Sig  . ACCU-CHEK FASTCLIX LANCETS MISC   . aspirin EC 81 MG tablet Take 162 mg by mouth daily.  . Cimetidine (ACID REDUCER PO) Take 1 tablet by mouth daily.  . Cyanocobalamin (B-12 PO) Take by mouth daily.  Marland Kitchen glucose blood (ONE TOUCH ULTRA TEST) test strip Check blood sugar twice daily and as directed.250.02  . hydrochlorothiazide (HYDRODIURIL) 25 MG tablet TAKE 1 TABLET EVERY DAY  . Insulin Glargine (LANTUS SOLOSTAR) 100 UNIT/ML Solostar Pen Inject 10  Units into the skin daily.  . Insulin Pen Needle (NOVOFINE) 30G X 8 MM MISC Inject 10 each into the skin as needed.  Marland Kitchen JANUVIA 100 MG tablet TAKE 1 TABLET EVERY DAY  . levothyroxine (SYNTHROID, LEVOTHROID) 100 MCG tablet TAKE 1 TABLET EVERY DAY  . lisinopril (PRINIVIL,ZESTRIL) 5 MG tablet TAKE 1 TABLET EVERY DAY  . metFORMIN (GLUCOPHAGE) 500 MG tablet TAKE 2 TABLETS IN THE MORNING AND 2 TABLETS AT NIGHT  . metoprolol tartrate (LOPRESSOR) 25 MG tablet TAKE 1/2 TABLET TWICE DAILY  . Multiple Vitamin (MULTIVITAMIN) tablet Take 1 tablet by mouth daily.  . simvastatin (ZOCOR) 20 MG tablet TAKE 1 TABLET AT BEDTIME  . ziprasidone (GEODON) 40 MG capsule TAKE 1 CAPSULE TWICE DAILY  WITH  A  MEAL   No current facility-administered medications on file prior to visit.    Review of Systems Per HPI unless specifically indicated above    Objective:    BP 130/70  Pulse 96  Temp(Src) 97.6 F (36.4 C) (Oral)  Wt 280 lb (127.007 kg)  Physical Exam  Nursing note and vitals reviewed. Constitutional: She appears well-developed and well-nourished. No distress.  Morbidly obese  HENT:  Mouth/Throat: Oropharynx is clear and moist. No oropharyngeal exudate.  Cardiovascular: Normal rate, regular rhythm, normal heart sounds and intact distal pulses.   No murmur heard. Pulmonary/Chest: Effort normal and breath sounds normal. No respiratory distress. She has no wheezes. She has no rales.  Musculoskeletal: She exhibits no edema.  Diabetic foot  exam: Normal inspection No skin breakdown No calluses  Diminished DP pulses Normal sensation to light touch and monofilament Nails normal   Results for orders placed in visit on 03/16/14  HM DIABETES EYE EXAM      Result Value Ref Range   HM Diabetic Eye Exam No Retinopathy  No Retinopathy      Assessment & Plan:   Problem List Items Addressed This Visit   Hypertension     Chronic, stable. Continue regimen.    HLD (hyperlipidemia)     Chronic, stable.  Continue current regimen. Check FLP next fasting labwork.    Diabetes type 2, uncontrolled - Primary     Persistently uncontrolled diabetes despite addition of low dose lantus. Discussed slow taper up to max 30u daily, then call me with update on sugars. Continue other oral meds as up to now. RTC 3-4 mo f/u. Pt agrees with plan. Encouraged staying as active as tolerable.    Relevant Medications      glimepiride (AMARYL) tablet   Other Relevant Orders      Basic metabolic panel      Hemoglobin A1c      HM DIABETES FOOT EXAM (Completed)       Follow up plan: Return in about 3 months (around 06/16/2014), or as needed, for follow up.

## 2014-03-16 NOTE — Assessment & Plan Note (Signed)
Chronic, stable. Continue regimen. 

## 2014-03-23 ENCOUNTER — Encounter: Payer: Self-pay | Admitting: Family Medicine

## 2014-03-24 MED ORDER — INSULIN GLARGINE 100 UNIT/ML SOLOSTAR PEN: 30.0000 [IU] | PEN_INJECTOR | Freq: Every day | SUBCUTANEOUS | Status: DC

## 2014-03-31 ENCOUNTER — Other Ambulatory Visit: Payer: Self-pay | Admitting: Family Medicine

## 2014-04-13 ENCOUNTER — Other Ambulatory Visit: Payer: Self-pay

## 2014-04-13 DIAGNOSIS — Z1231 Encounter for screening mammogram for malignant neoplasm of breast: Secondary | ICD-10-CM

## 2014-04-28 ENCOUNTER — Other Ambulatory Visit: Payer: Self-pay | Admitting: Family Medicine

## 2014-05-06 ENCOUNTER — Encounter: Payer: Self-pay | Admitting: Family Medicine

## 2014-05-11 ENCOUNTER — Encounter: Payer: Self-pay | Admitting: Family Medicine

## 2014-05-11 MED ORDER — GLUCOSE BLOOD VI STRP: ORAL_STRIP | Status: DC

## 2014-05-15 ENCOUNTER — Encounter: Payer: Self-pay | Admitting: Family Medicine

## 2014-05-15 ENCOUNTER — Other Ambulatory Visit: Payer: Self-pay

## 2014-05-15 MED ORDER — GLUCOSE BLOOD VI STRP: ORAL_STRIP | Status: DC

## 2014-05-25 ENCOUNTER — Ambulatory Visit
Admission: RE | Admit: 2014-05-25 | Discharge: 2014-05-25 | Disposition: A | Discharge status: Home / Self Care | Payer: Commercial Managed Care - HMO | Source: Ambulatory Visit

## 2014-05-25 DIAGNOSIS — Z1231 Encounter for screening mammogram for malignant neoplasm of breast: Secondary | ICD-10-CM

## 2014-05-25 LAB — HM MAMMOGRAPHY: HM Mammogram: NORMAL

## 2014-05-26 ENCOUNTER — Encounter: Payer: Self-pay | Admitting: *Deleted

## 2014-05-29 ENCOUNTER — Other Ambulatory Visit: Payer: Self-pay | Admitting: *Deleted

## 2014-05-29 ENCOUNTER — Encounter: Payer: Self-pay | Admitting: Family Medicine

## 2014-05-29 MED ORDER — GLUCOSE BLOOD VI STRP: ORAL_STRIP | Status: DC

## 2014-05-29 MED ORDER — ONETOUCH ULTRA SYSTEM W/DEVICE KIT: 1.0000 | PACK | Freq: Once | Status: DC

## 2014-05-29 MED ORDER — LANCETS ULTRA THIN 30G MISC: Status: DC

## 2014-06-02 ENCOUNTER — Other Ambulatory Visit: Payer: Self-pay | Admitting: *Deleted

## 2014-06-02 ENCOUNTER — Encounter: Payer: Self-pay | Admitting: Family Medicine

## 2014-06-02 MED ORDER — LANCETS ULTRA THIN 30G MISC: Status: DC

## 2014-06-03 ENCOUNTER — Other Ambulatory Visit: Payer: Self-pay | Admitting: *Deleted

## 2014-06-03 ENCOUNTER — Other Ambulatory Visit: Payer: Self-pay | Admitting: Family Medicine

## 2014-06-03 MED ORDER — ACCU-CHEK SOFTCLIX LANCET DEV MISC: Status: DC

## 2014-06-03 MED ORDER — GLUCOSE BLOOD VI STRP: ORAL_STRIP | Status: DC

## 2014-06-03 MED ORDER — ACCU-CHEK NANO SMARTVIEW W/DEVICE KIT: PACK | Status: DC

## 2014-06-04 ENCOUNTER — Other Ambulatory Visit: Payer: Self-pay | Admitting: *Deleted

## 2014-06-06 ENCOUNTER — Encounter: Payer: Self-pay | Admitting: Family Medicine

## 2014-06-09 ENCOUNTER — Other Ambulatory Visit: Payer: Self-pay | Admitting: *Deleted

## 2014-06-09 MED ORDER — LANCETS THIN MISC: Status: DC

## 2014-06-15 ENCOUNTER — Other Ambulatory Visit: Payer: Self-pay | Admitting: *Deleted

## 2014-06-15 MED ORDER — ACCU-CHEK FASTCLIX LANCETS MISC: Status: DC

## 2014-06-16 ENCOUNTER — Ambulatory Visit (INDEPENDENT_AMBULATORY_CARE_PROVIDER_SITE_OTHER): Payer: Commercial Managed Care - HMO | Admitting: Family Medicine

## 2014-06-16 ENCOUNTER — Encounter: Payer: Self-pay | Admitting: Family Medicine

## 2014-06-16 VITALS — BP 138/84 | HR 80 | Temp 97.5°F | Wt 277.5 lb

## 2014-06-16 DIAGNOSIS — F3178 Bipolar disorder, in full remission, most recent episode mixed: Secondary | ICD-10-CM

## 2014-06-16 DIAGNOSIS — Z23 Encounter for immunization: Secondary | ICD-10-CM

## 2014-06-16 DIAGNOSIS — IMO0002 Reserved for concepts with insufficient information to code with codable children: Secondary | ICD-10-CM

## 2014-06-16 DIAGNOSIS — I10 Essential (primary) hypertension: Secondary | ICD-10-CM

## 2014-06-16 DIAGNOSIS — E1165 Type 2 diabetes mellitus with hyperglycemia: Secondary | ICD-10-CM

## 2014-06-16 LAB — HEMOGLOBIN A1C: Hgb A1c MFr Bld: 8.2 % — ABNORMAL HIGH (ref 4.6–6.5)

## 2014-06-16 NOTE — Patient Instructions (Addendum)
Flu shot today. Sugars are slowly improving. Let's increase lantus by 2 units every 3 days to total of 45 units if average fasting sugar still >150. Call me with any questions. A1c checked today (labwork). Return to see me in 3 months for folllow up

## 2014-06-16 NOTE — Assessment & Plan Note (Signed)
Chronic, stable. Continue regimen. 

## 2014-06-16 NOTE — Assessment & Plan Note (Signed)
Sugars slowly improving - continue lantus 30 u - will slowly taper to 45 units.  Continue oral regimen. Pt agrees with plan. Return in 3 mo for f/u.

## 2014-06-16 NOTE — Addendum Note (Signed)
Addended by: Royann Shivers A on: 06/16/2014 11:57 AM   Modules accepted: Orders

## 2014-06-16 NOTE — Addendum Note (Signed)
Addended by: Daralene Milch C on: 06/16/2014 03:06 PM   Modules accepted: Orders

## 2014-06-16 NOTE — Assessment & Plan Note (Signed)
Trouble seeing psychiatrist for orthopedic reasons 2/2 knee pain. I agreed to continue geodon until beginning of new year then pt will retry establishing with new provider with better office more accessible for her handicap.

## 2014-06-16 NOTE — Progress Notes (Signed)
Pre visit review using our clinic review tool, if applicable. No additional management support is needed unless otherwise documented below in the visit note. 

## 2014-06-16 NOTE — Progress Notes (Signed)
BP 138/84  Pulse 80  Temp(Src) 97.5 F (36.4 C) (Oral)  Wt 277 lb 8 oz (125.873 kg)   CC: 3 mo f/u visit  Subjective:    Patient ID: [name], female    DOB: 1951-02-02, 63 y.o.   MRN: [id number]  HPI: [name] is a 63 y.o. female presenting on 06/16/2014 for Follow-up   DM - brings log. regularly does check sugars twice daily 150-190 fasting, 150-230s pm. Compliant with antihyperglycemic regimen which includes: metformin 1013m bid, januvia 1046mand amaryl 32m29maily. Recent addition of lantus QAM with titration up to 30 units. Denies low sugars or hypoglycemic symptoms. Denies paresthesias. Last diabetic eye exam 02/2014. Pneumovax: 2012. Prevnar: not done yet.   HTN - Compliant with current antihypertensive regimen of lisinopril 5mg46mily, metoprolol 12.5mg 66m and hctz 25mg 632my. bp at home well controlled. No low blood pressure symptoms of dizziness/syncope. Denies vision changes, CP/tightness, SOB, leg swelling.   R posterior occipital headache ongoing for last 4-5 days. Taking goody powder which helps. Denies neck stiffness.  Bipolar - on geodon. [place] can't take stairs down to office 2/2 knee pain. Has been unable to schedule new psychiatrist because no one else is taking new medicare patients.  Hot flashes since hysterectomy - pt does not want hormonal medication or other med for this.    Lab Results  Component Value Date   HGBA1C 8.6* 03/16/2014   Wt Readings from Last 3 Encounters:  06/16/14 277 lb 8 oz (125.873 kg)  03/16/14 280 lb (127.007 kg)  12/15/13 263 lb 8 oz (119.523 kg)   Body mass index is 47.61 kg/(m^2).  Lab Results  Component Value Date   CREATININE 0.7 03/16/2014   Lab Results  Component Value Date   TSH 3.37 12/08/2013   Relevant past medical, surgical, family and social history reviewed and updated as indicated.  Allergies and medications reviewed and updated. Current Outpatient  Prescriptions on File Prior to Visit  Medication Sig  . ACCU-CHEK FASTCLIX LANCETS MISC Use to check sugar twice daily Dx: E11.65  . aspirin EC 81 MG tablet Take 162 mg by mouth daily.  . Blood Glucose Monitoring Suppl (ACCU-CHEK NANO SMARTVIEW) W/DEVICE KIT Use to check sugar twice daily Dx: E11.65  . Cimetidine (ACID REDUCER PO) Take 1 tablet by mouth daily.  . Cyanocobalamin (B-12 PO) Take by mouth daily.  . glimMarland Kitchenpiride (AMARYL) 4 MG tablet Take 1 tablet (4 mg total) by mouth daily with breakfast.  . glucose blood test strip Use as instructed to check sugar twice daily Dx: E11.65 **AccuChek Nano**  . hydrochlorothiazide (HYDRODIURIL) 25 MG tablet TAKE 1 TABLET EVERY DAY  . Insulin Pen Needle (NOVOFINE) 30G X 8 MM MISC Inject 10 each into the skin as needed.  . JANUMarland KitchenIA 100 MG tablet TAKE 1 TABLET EVERY DAY  . levothyroxine (SYNTHROID, LEVOTHROID) 100 MCG tablet TAKE 1 TABLET EVERY DAY  . lisinopril (PRINIVIL,ZESTRIL) 5 MG tablet TAKE 1 TABLET EVERY DAY  . metFORMIN (GLUCOPHAGE) 500 MG tablet TAKE 2 TABLETS IN THE MORNING AND 2 TABLETS AT NIGHT  . metoprolol tartrate (LOPRESSOR) 25 MG tablet TAKE 1/2 TABLET TWICE DAILY  . Multiple Vitamin (MULTIVITAMIN) tablet Take 1 tablet by mouth daily.  . naproxen (NAPROSYN) 500 MG tablet Take 500 mg by mouth daily as needed. Take one po bid x 5 days then prn pain, take with food  . simvastatin (ZOCOR) 20 MG tablet  TAKE 1 TABLET AT BEDTIME  . ziprasidone (GEODON) 40 MG capsule TAKE 1 CAPSULE TWICE DAILY WITH MEALS   No current facility-administered medications on file prior to visit.    Review of Systems Per HPI unless specifically indicated above    Objective:    BP 138/84  Pulse 80  Temp(Src) 97.5 F (36.4 C) (Oral)  Wt 277 lb 8 oz (125.873 kg)  Physical Exam  Nursing note and vitals reviewed. Constitutional: She appears well-developed and well-nourished. No distress.  Morbidly obese  HENT:  Mouth/Throat: Oropharynx is clear and moist.  No oropharyngeal exudate.  Cardiovascular: Normal rate, regular rhythm, normal heart sounds and intact distal pulses.   No murmur heard. Pulmonary/Chest: Effort normal and breath sounds normal. No respiratory distress. She has no wheezes. She has no rales.  Musculoskeletal: She exhibits no edema.  Psychiatric: She has a normal mood and affect.   Results for orders placed in visit on 05/26/14  HM MAMMOGRAPHY      Result Value Ref Range   HM Mammogram Normal Birads 1 Repeat 1 year        Assessment & Plan:   Problem List Items Addressed This Visit   Hypertension     Chronic, stable. Continue regimen.    Diabetes type 2, uncontrolled     Sugars slowly improving - continue lantus 30 u - will slowly taper to 45 units.  Continue oral regimen. Pt agrees with plan. Return in 3 mo for f/u.    Relevant Medications      Insulin Glargine (LANTUS) 100 UNIT/ML Solostar Pen   Bipolar disorder - Primary     Trouble seeing psychiatrist for orthopedic reasons 2/2 knee pain. I agreed to continue geodon until beginning of new year then pt will retry establishing with new provider with better office more accessible for her handicap.        Follow up plan: Return in about 3 months (around 09/16/2014), or as needed, for follow up visit.

## 2014-06-17 ENCOUNTER — Telehealth: Payer: Self-pay | Admitting: Family Medicine

## 2014-06-17 ENCOUNTER — Other Ambulatory Visit: Payer: Self-pay | Admitting: *Deleted

## 2014-06-17 NOTE — Telephone Encounter (Signed)
emmi emailed °

## 2014-06-26 ENCOUNTER — Encounter: Payer: Self-pay | Admitting: Family Medicine

## 2014-06-26 MED ORDER — INSULIN GLARGINE 100 UNIT/ML SOLOSTAR PEN: 45.0000 [IU] | PEN_INJECTOR | Freq: Every day | SUBCUTANEOUS | Status: DC

## 2014-07-20 ENCOUNTER — Other Ambulatory Visit: Payer: Self-pay | Admitting: Family Medicine

## 2014-09-11 ENCOUNTER — Other Ambulatory Visit: Payer: Self-pay | Admitting: Family Medicine

## 2014-09-17 ENCOUNTER — Encounter: Payer: Self-pay | Admitting: Family Medicine

## 2014-09-17 ENCOUNTER — Ambulatory Visit (INDEPENDENT_AMBULATORY_CARE_PROVIDER_SITE_OTHER): Payer: Commercial Managed Care - HMO | Admitting: Family Medicine

## 2014-09-17 VITALS — BP 124/64 | HR 88 | Temp 97.6°F | Wt 277.5 lb

## 2014-09-17 DIAGNOSIS — J069 Acute upper respiratory infection, unspecified: Secondary | ICD-10-CM

## 2014-09-17 DIAGNOSIS — I10 Essential (primary) hypertension: Secondary | ICD-10-CM

## 2014-09-17 DIAGNOSIS — IMO0002 Reserved for concepts with insufficient information to code with codable children: Secondary | ICD-10-CM

## 2014-09-17 DIAGNOSIS — B9789 Other viral agents as the cause of diseases classified elsewhere: Secondary | ICD-10-CM

## 2014-09-17 DIAGNOSIS — E785 Hyperlipidemia, unspecified: Secondary | ICD-10-CM

## 2014-09-17 DIAGNOSIS — F3178 Bipolar disorder, in full remission, most recent episode mixed: Secondary | ICD-10-CM

## 2014-09-17 DIAGNOSIS — E1165 Type 2 diabetes mellitus with hyperglycemia: Secondary | ICD-10-CM

## 2014-09-17 LAB — HEMOGLOBIN A1C: Hgb A1c MFr Bld: 8.4 % — ABNORMAL HIGH (ref 4.6–6.5)

## 2014-09-17 LAB — LDL CHOLESTEROL, DIRECT: Direct LDL: 71 mg/dL

## 2014-09-17 MED ORDER — SIMVASTATIN 20 MG PO TABS: 20.0000 mg | ORAL_TABLET | Freq: Every day | ORAL | Status: DC

## 2014-09-17 MED ORDER — GUAIFENESIN-CODEINE 100-10 MG/5ML PO SYRP: 5.0000 mL | ORAL_SOLUTION | Freq: Every evening | ORAL | Status: DC | PRN

## 2014-09-17 MED ORDER — INSULIN GLARGINE 100 UNIT/ML SOLOSTAR PEN: 50.0000 [IU] | PEN_INJECTOR | Freq: Every day | SUBCUTANEOUS | Status: DC

## 2014-09-17 NOTE — Assessment & Plan Note (Signed)
Recent viral uri with cough - improving appropriately. Add cheratussin for cough at night time. Pt has tolerated codeine well in past. No evidence of persistent bacterial infection today.

## 2014-09-17 NOTE — Assessment & Plan Note (Signed)
We have been refilling geodon. Plan was to establish with local psychiatrist at new year - will need to readdress.

## 2014-09-17 NOTE — Progress Notes (Signed)
Pre visit review using our clinic review tool, if applicable. No additional management support is needed unless otherwise documented below in the visit note. 

## 2014-09-17 NOTE — Assessment & Plan Note (Signed)
Chronic, stable. Continue simvastatin. Check dLDL today.

## 2014-09-17 NOTE — Assessment & Plan Note (Signed)
Slow improvement noted. Increase lantus to 50u daily. Discussed possible addition of mealtime rapid acting insulin prn carb heavy meal - as sugars tend to be very sensitive to meals. Recheck in 3 months. A1c today.

## 2014-09-17 NOTE — Assessment & Plan Note (Signed)
Chronic, stable. Continue reginmen.

## 2014-09-17 NOTE — Patient Instructions (Addendum)
Let's slowly increase lantus to 50 units daily. 1 unit every 3 days. Codeine cough syrup for night time. Return in 4 months for medicare wellness visit Good to see you today, call us with questions.

## 2014-09-17 NOTE — Progress Notes (Signed)
BP 124/64 mmHg  Pulse 88  Temp(Src) 97.6 F (36.4 C) (Oral)  Wt 277 lb 8 oz (125.873 kg)   CC: 3 mo f/u visit  Subjective:    Patient ID: [name], female    DOB: 03-24-1951, 64 y.o.   MRN: [id number]  HPI: [name] is a 64 y.o. female presenting on 09/17/2014 for Follow-up   Sick for 2 weeks with cold. Coughing mildly productive of mucous, sneezing, dyspnea with cough. sxs seem to be improving slowly. Has been taking OTC cold meds - which have raised sugars. Taking theraflu, tylenol cold, nyquil at night time.  No fevers/chills, ear or tooth pain, headaches, chest pain Sons sick first.  No smokers at home.  Requests cough medication. Doesn't think needs abx.   DM - brings log. regularly does check sugars twice daily 140-180 fasting, 150-230s pm 2 hours after dinner. Compliant with antihyperglycemic regimen which includes: metformin 1028m bid, januvia 1072mand amaryl 36m68maily. Recent addition of lantus QAM with titration up to 45 units. Denies low sugars or hypoglycemic symptoms. Denies paresthesias. Last diabetic eye exam 02/2014. Pneumovax: 2012. Prevnar: not done yet.  Lab Results  Component Value Date   HGBA1C 8.2* 06/16/2014    HTN - Compliant with current antihypertensive regimen of lisinopril 5mg80mily, metoprolol 12.5mg 33m and hctz 25mg 1my. bp at home well controlled. No low blood pressure symptoms of dizziness/syncope. Denies vision changes, CP/tightness, SOB, leg swelling.   HLD - compliant with simvastatin. No myalgias.  Bipolar - on geodon which has worked very well for her despite weight gain - intolerant to others tried. [place] because can't take stairs down to office 2/2 knee pain. Has been unable to schedule new psychiatrist because no one else is taking new medicare patients.   Relevant past medical, surgical, family and social history reviewed and updated as indicated. Interim medical history since our  last visit reviewed. Allergies and medications reviewed and updated. Current Outpatient Prescriptions on File Prior to Visit  Medication Sig  . ACCU-CHEK FASTCLIX LANCETS MISC Use to check sugar twice daily Dx: E11.65  . aspirin EC 81 MG tablet Take 162 mg by mouth daily.  . Blood Glucose Monitoring Suppl (ACCU-CHEK NANO SMARTVIEW) W/DEVICE KIT Use to check sugar twice daily Dx: E11.65  . Cimetidine (ACID REDUCER PO) Take 1 tablet by mouth daily.  . glimMarland Kitchenpiride (AMARYL) 4 MG tablet Take 1 tablet (4 mg total) by mouth daily with breakfast.  . glucose blood test strip Use as instructed to check sugar twice daily Dx: E11.65 **AccuChek Nano**  . hydrochlorothiazide (HYDRODIURIL) 25 MG tablet TAKE 1 TABLET EVERY DAY  . Insulin Pen Needle (NOVOFINE) 30G X 8 MM MISC Inject 10 each into the skin as needed.  . JANUMarland KitchenIA 100 MG tablet TAKE 1 TABLET EVERY DAY  . levothyroxine (SYNTHROID, LEVOTHROID) 100 MCG tablet TAKE 1 TABLET EVERY DAY  . lisinopril (PRINIVIL,ZESTRIL) 5 MG tablet TAKE 1 TABLET EVERY DAY  . metFORMIN (GLUCOPHAGE) 500 MG tablet TAKE 2 TABLETS IN THE MORNING AND 2 TABLETS AT NIGHT  . metoprolol tartrate (LOPRESSOR) 25 MG tablet TAKE 1/2 TABLET TWICE DAILY  . Multiple Vitamin (MULTIVITAMIN) tablet Take 1 tablet by mouth daily.  . naproxen (NAPROSYN) 500 MG tablet Take 500 mg by mouth daily as needed. Take one po bid x 5 days then prn pain, take with food  . ziprasidone (GEODON) 40 MG capsule TAKE 1 CAPSULE TWICE DAILY  WITH MEALS   No current facility-administered medications on file prior to visit.    Review of Systems Per HPI unless specifically indicated above     Objective:    BP 124/64 mmHg  Pulse 88  Temp(Src) 97.6 F (36.4 C) (Oral)  Wt 277 lb 8 oz (125.873 kg)  Wt Readings from Last 3 Encounters:  09/17/14 277 lb 8 oz (125.873 kg)  06/16/14 277 lb 8 oz (125.873 kg)  03/16/14 280 lb (127.007 kg)    Physical Exam  Constitutional: She appears well-developed and  well-nourished. No distress.  Morbidly obese  HENT:  Head: Normocephalic and atraumatic.  Right Ear: External ear normal.  Left Ear: External ear normal.  Nose: Nose normal.  Mouth/Throat: Oropharynx is clear and moist. No oropharyngeal exudate.  Eyes: Conjunctivae and EOM are normal. Pupils are equal, round, and reactive to light. No scleral icterus.  Neck: Normal range of motion. Neck supple.  Cardiovascular: Normal rate, regular rhythm, normal heart sounds and intact distal pulses.   No murmur heard. Pulmonary/Chest: Effort normal and breath sounds normal. No respiratory distress. She has no wheezes. She has no rales.  Musculoskeletal: She exhibits no edema.  See HPI for foot exam if done  Lymphadenopathy:    She has no cervical adenopathy.  Skin: Skin is warm and dry. No rash noted.  Psychiatric: She has a normal mood and affect.  Nursing note and vitals reviewed.  Results for orders placed or performed in visit on 06/16/14  Hemoglobin A1c  Result Value Ref Range   Hgb A1c MFr Bld 8.2 (H) 4.6 - 6.5 %      Assessment & Plan:   Problem List Items Addressed This Visit    Viral URI with cough    Recent viral uri with cough - improving appropriately. Add cheratussin for cough at night time. Pt has tolerated codeine well in past. No evidence of persistent bacterial infection today.      Hypertension    Chronic, stable. Continue reginmen.      Relevant Medications   simvastatin (ZOCOR) tablet   HLD (hyperlipidemia)    Chronic, stable. Continue simvastatin. Check dLDL today.      Relevant Medications   simvastatin (ZOCOR) tablet   Other Relevant Orders   LDL cholesterol, direct   Diabetes type 2, uncontrolled - Primary    Slow improvement noted. Increase lantus to 50u daily. Discussed possible addition of mealtime rapid acting insulin prn carb heavy meal - as sugars tend to be very sensitive to meals. Recheck in 3 months. A1c today.      Relevant Medications    Insulin Glargine (LANTUS) 100 UNIT/ML Solostar Pen   simvastatin (ZOCOR) tablet   Other Relevant Orders   Hemoglobin A1c   Bipolar disorder    We have been refilling geodon. Plan was to establish with local psychiatrist at new year - will need to readdress.          Follow up plan: Return in about 4 months (around 01/16/2015), or as needed, for medicare wellness.

## 2014-10-06 ENCOUNTER — Encounter: Payer: Self-pay | Admitting: Family Medicine

## 2014-10-06 MED ORDER — INSULIN GLARGINE 100 UNIT/ML SOLOSTAR PEN: 50.0000 [IU] | PEN_INJECTOR | Freq: Every day | SUBCUTANEOUS | Status: DC

## 2014-10-12 ENCOUNTER — Other Ambulatory Visit: Payer: Self-pay | Admitting: Family Medicine

## 2014-10-30 ENCOUNTER — Other Ambulatory Visit: Payer: Self-pay | Admitting: *Deleted

## 2014-10-30 MED ORDER — GLUCOSE BLOOD VI STRP: ORAL_STRIP | Status: DC

## 2014-10-30 MED ORDER — METOPROLOL TARTRATE 25 MG PO TABS: 12.5000 mg | ORAL_TABLET | Freq: Two times a day (BID) | ORAL | Status: DC

## 2014-12-11 NOTE — Discharge Summary (Signed)
PATIENT NAME:  Jessica [name], [name] MR#:  [id number] DATE OF BIRTH:  1951/04/25  DATE OF ADMISSION:  08/27/2012 DATE OF DISCHARGE:  08/30/2012  DISCHARGE DIAGNOSES: 1.  Severe degenerative arthritis, left  knee.  2.  Hypertension.  3.  Type 2 diabetes mellitus. 4.  Hyperlipidemia.  5.  Hypothyroidism.  6.  Obstructive sleep apnea.  7.  Gastroesophageal reflux disease.   OPERATIONS AND PROCEDURES PERFORMED:  LCS left total knee replacement on 08/27/2012.  HISTORY AND PHYSICAL EXAMINATION:  As written on admission.   LABORATORY DATA:  As noted in the chart.   HOSPITAL COURSE:  Preoperative medical clearance had been obtained. The patient was taken to the operating room on the morning of 08/27/2012 and LCS left total knee replacement was performed easily and without difficulty. No complications were encountered. Postoperative x-ray was satisfactory. Consultation was obtained with Dr. Posey Pronto from [facility] to follow the patient along while in the office for any medical problems. The patient in general extremely well. She was advanced up into the chair on postoperative day #1 and range of motion of the knee was begun in physical therapy. Her drain was pulled on postoperative day #1 and dressing changed to a Covaderm.  The patient progressed extremely well, despite her size and on 08/30/2012, it was felt that she could safely be discharged home with home health physical therapy to continue the total knee protocol.    DISCHARGE INSTRUCTIONS:  Home medication report was printed and given to the patient.  She is instructed to take, in addition to her usual home medications, aspirin 325 mg with food twice a day. She is given a prescription for oxycodone 10 mg every 6 hours as necessary for pain. She is given a return appointment to follow-up with Dr. Christophe Louis in the office in 10 days for exam, x-ray and staple removal.    ____________________________ Lucas Mallow, MD ces:ct D: 09/15/2012  09:18:00 ET T: 09/15/2012 10:17:20 ET JOB#: [id number]  cc: Lucas Mallow, MD, <Dictator> Lucas Mallow MD ELECTRONICALLY SIGNED 09/16/2012 15:55

## 2014-12-11 NOTE — Consult Note (Signed)
PATIENT NAME:  [name], JONS MR#:  [id number] DATE OF BIRTH:  May 27, 1951  DATE OF CONSULTATION:  08/27/2012  PRIMARY CARE PHYSICIAN:  Dr. Ria Bush.   REFERRING PHYSICIAN:  Dr. [name].  CONSULTING PHYSICIAN:  Treana Lacour A. Posey Pronto, MD  REASON FOR CONSULTATION:  Postop medical management.  HISTORY OF PRESENT ILLNESS:  The patient is a 64 year old morbidly obese Caucasian female with history of hypertension, diabetes, DJD, hyperlipidemia and coronary artery disease, was admitted for elective left total knee replacement. The patient had progressively worsened left knee arthritis to the point she was unable to get around without significant pain. The patient was admitted and had preop medical clearance by Dr. Danise Mina and underwent a left total knee replacement by Dr. [name] on 08/27/2012. Internal medicine was consulted for postop medical management. The patient denies currently any symptoms other than postsurgical left knee pain. She is currently on a PCA morphine pump.   PAST MEDICAL HISTORY: 1.  Type 2 diabetes.  2.  Obstructive sleep apnea on CPAP.  3.  CAD with last heart catheterization 03/2011, which showed nonobstructive coronary disease. Her echo showed EF of 55% to 60% with mild diastolic dysfunction.  4.  Hypothyroidism.  5.  History of candidal intertrigo.  6.  Hypertension.  7.  Bipolar disorder.  8.  Fibromyalgia.  9.  Hyperlipidemia.  10.  GERD. 11.  Anxiety.  12.  Osteoarthritis.   HOME MEDICATIONS:  Are: 1.  Simvastatin 20 mg p.o. daily at bedtime.  2.  Naproxen 500 mg extended-release 1 b.i.d.  3.  Multivitamin p.o. daily.  4.  Metformin 1000 mg b.i.d.  5.  Lisinopril 5 mg 1 tablet daily.  6.  Levothyroxine 100 mcg p.o. daily.  7.  Januvia 100 mg p.o. daily.  8.  Hydrochlorothiazide/triamterene 25/37.5, 1 p.o. daily.  9.  Glimepiride 4 mg p.o. daily.  10.  Geodon 40 mg 1 tablet twice a day.  11.  Famotidine 20 mg p.o. daily.   FAMILY HISTORY:  Positive for  hypertension.   ALLERGIES:  CRESTOR, LIPITOR, LITHIUM AND SEROQUEL.    REVIEW OF SYSTEMS:  CONSTITUTIONAL:  No fever,  fatigue or weakness.  EYES:  No blurred or double vision. No glaucoma.  ENT:  No tinnitus, ear pain, hearing loss or postnasal drip.  RESPIRATORY:  No cough, wheeze, hemoptysis or dyspnea.  CARDIOVASCULAR:  No chest pain, orthopnea, edema. Positive for hypertension.  GASTROINTESTINAL:  No nausea, vomiting, diarrhea, abdominal pain. No hematemesis.   GENITOURINARY:  No dysuria or hematuria.  ENDOCRINE:  No polyuria, nocturia or thyroid problems.  HEMATOLOGY:  No anemia or easy bruising.  SKIN:  No acne or rash.  MUSCULOSKELETAL:  Positive for DJD.  NEUROLOGIC:  No CVA or TIA.   PSYCHIATRIC:  No anxiety, depression. All other systems reviewed and negative.   PHYSICAL EXAMINATION: GENERAL:  The patient is awake, alert, oriented x 3, not in acute distress.  VITAL SIGNS:  Afebrile, pulse is 88, blood pressure 158/84. Sats are 96% on room air.  GENERAL:  The patient is morbidly obese, not in acute distress.  HEENT:  Atraumatic, normocephalic. Pupils: PERRLA.  EOM intact.  Oral mucosa is moist.  NECK:  Supple. No JVD. No carotid bruit.  LUNGS:  Clear to auscultation bilaterally. No rales, rhonchi, respiratory distress or labored breathing.  HEART:  Both the heart sounds are normal. Rate, rhythm is regular. PMI not lateralized. Chest  nontender.  EXTREMITIES:  Good pedal pulses, good femoral pulses. No lower extremity edema. Left  knee has Band-Aid dressing present along with ice pack and a drain.  NEUROLOGIC:  Grossly intact. Cranial nerves II through XII: No motor or sensory deficits.  PSYCHIATRIC:  The patient is awake, alert, oriented x 3.  SKIN:  Warm and dry.  ABDOMEN:  Soft and nontender. No organomegaly. Positive bowel sounds.   LABORATORY DATA:  Creatinine 1.0. Preop labs: H and H is 13.1 and 37.7, platelet count is  352. BUN and creatinine were 16 and 0.99. Rest of  the electrolytes were normal. PT/INR was 11.8 and 0.8.   ASSESSMENT: The patient is a 64 year old with history of coronary artery disease, hypertension, type 2 diabetes and severe degenerative joint disease, underwent left total knee replacement on 08/27/2012/  Internal medicine consultation is requested for medical management. 1.  Hypertension.  The patient's blood pressure is on the higher side, likely due to pain postsurgery. We will continue hydrochlorothiazide/triamterene and continue patient controlled analgesia pump and oxycodone as you are. The patient will also be continued on lisinopril. Dosage will be adjusted if blood pressure continues to remain on the higher side.  2.  Type 2 diabetes. Continue sliding scale, metformin, Januvia and glipizide.  3.  Hyperlipidemia. Continue simvastatin daily.  4.  Hypothyroidism. The patient is on Synthroid.  5.  Deep vein thrombosis prophylaxis with Lovenox b.i.d.  6.  Obstructive sleep apnea. The patient is allowed to use her own CPAP on her CPAP settings. Respiratory  to assist with that.  7.  Gastroesophageal reflux disease, on famotidine.   Thank you for the consult. We will follow while the patient is in house.    TIME SPENT:  45 minutes.   ___________________________ Hart Rochester Posey Pronto, MD sap:dm D: 08/27/2012 19:49:00 ET T: 08/27/2012 20:28:52 ET JOB#: [id number]  cc: Virdia Ziesmer A. Posey Pronto, MD, <Dictator> Lucas Mallow, MD Ria Bush, MD Ilda Basset MD ELECTRONICALLY SIGNED 08/30/2012 12:37

## 2014-12-11 NOTE — Op Note (Signed)
PATIENT NAME:  [name], [name] MR#:  [id number] DATE OF BIRTH:  05/03/1951  DATE OF PROCEDURE:  08/27/2012  PREOPERATIVE DIAGNOSIS: Severe degenerative arthritis left knee.   POSTOPERATIVE DIAGNOSIS:  Severe degenerative arthritis left knee.   PROCEDURE: LCS left total knee replacement.   SURGEON: Christophe Louis, M.D.   ANESTHESIA: Spinal.   COMPLICATIONS: None.   DRAINS: Two Autovacs.   TOURNIQUET TIME: Two hours and 14 minutes.   DESCRIPTION OF PROCEDURE:  Ancef 1 gram was given intravenously prior to the procedure. Spinal anesthesia is induced. The Foley catheter is inserted. The left lower extremity is thoroughly prepped alcohol and ChloraPrep, and draped in standard sterile fashion. The extremity is wrapped out with the Esmarch bandage and pneumatic tourniquet elevated to 350 mmHg.   A standard anterior longitudinal incision is made and the dissection carried down to the medial and lateral retinaculum. Medial parapatellar incision is made and the patella is reflected laterally and the knee flexed. Standard synovial and bone debridement is performed, and the retractors are placed. The proximal tibial cutter is put into position and pinned, and is seen to be in satisfactory alignment. Proximal tibial cut is made and is seen to be satisfactory. Posterior cruciate and remnants of the menisci are excised. The femur is sized as a standard and the standard cutting block is put into place and the femoral rotation guide, 12.5 mm, is used and is seen to be a good fit with good alignment. The cutting block is pinned to the distal femur and the anterior and posterior cuts are made. The 5 degree valgus distal femoral cutting block is then impacted into place and pinned, and the distal femoral cut is made. A 12.5 spacer block is seen to be satisfactory in both extension and flexion. Femoral shaping block is impacted into place and the appropriate cuts made. Proximal tibia is sized as a +4, central  hole is made after the template is pinned to the tibia. The trial components are then put into place and seen to be stable with full extension and good flexion. Patella is prepared in standard fashion.   All trial components are then removed. The joint is thoroughly irrigated multiple times with pulsatile lavage. The +4 LCS tibial tray is then cemented into place with a rotating platform 12.5 mm. Porous-coated standard femur is impacted into place and seen to be a good fit, and the knee is extended. All cement is removed from around the tibia. The porous-coated standard patella is impacted into place and seen to be a good fit. The wound is again thoroughly irrigated multiple times. Retinaculum is closed with #1 Ti-Cron. Two Autovac drains are brought out through separate stab wound incisions. Subcutaneous tissue is closed with 2-0 Vicryl. The skin is closed with a stapler. A soft bulky dressing with a Polar Care and knee immobilizer are applied. The patient is returned to the recovery room in satisfactory condition, having tolerated the procedure quite well.    ____________________________ Lucas Mallow, MD ces:cs D: 08/27/2012 12:50:05 ET T: 08/27/2012 18:39:38 ET JOB#: [id number]  cc: Lucas Mallow, MD, <Dictator> Lucas Mallow MD ELECTRONICALLY SIGNED 08/28/2012 6:21

## 2014-12-11 NOTE — Consult Note (Signed)
PCP Dr Leo Grosser dr Gerald Stabs smith HTNDm-2left Knee severe DJD s/p TKR 1/7/14GERD family in the roompt for the consult. will follw with you  Electronic Signatures: Ilda Basset (MD)  (Signed on 07-Jan-14 19:36)  Authored  Last Updated: 07-Jan-14 19:36 by Ilda Basset (MD)

## 2015-01-13 ENCOUNTER — Other Ambulatory Visit: Payer: Self-pay | Admitting: Family Medicine

## 2015-01-15 ENCOUNTER — Other Ambulatory Visit: Payer: Self-pay | Admitting: Family Medicine

## 2015-01-15 ENCOUNTER — Other Ambulatory Visit (INDEPENDENT_AMBULATORY_CARE_PROVIDER_SITE_OTHER): Payer: Commercial Managed Care - HMO

## 2015-01-15 DIAGNOSIS — IMO0002 Reserved for concepts with insufficient information to code with codable children: Secondary | ICD-10-CM

## 2015-01-15 DIAGNOSIS — E039 Hypothyroidism, unspecified: Secondary | ICD-10-CM | POA: Diagnosis not present

## 2015-01-15 DIAGNOSIS — E1165 Type 2 diabetes mellitus with hyperglycemia: Secondary | ICD-10-CM | POA: Diagnosis not present

## 2015-01-15 DIAGNOSIS — I10 Essential (primary) hypertension: Secondary | ICD-10-CM

## 2015-01-15 DIAGNOSIS — E785 Hyperlipidemia, unspecified: Secondary | ICD-10-CM

## 2015-01-15 LAB — COMPREHENSIVE METABOLIC PANEL
ALBUMIN: 4.2 g/dL (ref 3.5–5.2)
ALT: 28 U/L (ref 0–35)
AST: 23 U/L (ref 0–37)
Alkaline Phosphatase: 56 U/L (ref 39–117)
BILIRUBIN TOTAL: 0.3 mg/dL (ref 0.2–1.2)
BUN: 12 mg/dL (ref 6–23)
CO2: 27 meq/L (ref 19–32)
CREATININE: 0.75 mg/dL (ref 0.40–1.20)
Calcium: 9.7 mg/dL (ref 8.4–10.5)
Chloride: 99 mEq/L (ref 96–112)
GFR: 83.13 mL/min (ref >60.00)
GLUCOSE: 159 mg/dL — AB (ref 70–99)
Potassium: 4.1 mEq/L (ref 3.5–5.1)
SODIUM: 137 meq/L (ref 135–145)
Total Protein: 7.9 g/dL (ref 6.0–8.3)

## 2015-01-15 LAB — CBC WITH DIFFERENTIAL/PLATELET
Basophils Absolute: 0 10*3/uL (ref 0.0–0.1)
Basophils Relative: 0.3 % (ref 0.0–3.0)
EOS ABS: 0 10*3/uL (ref 0.0–0.7)
Eosinophils Relative: 0 % (ref 0.0–5.0)
HEMATOCRIT: 40.9 % (ref 36.0–46.0)
Hemoglobin: 13.7 g/dL (ref 12.0–15.0)
LYMPHS PCT: 18 % (ref 12.0–46.0)
Lymphs Abs: 3 10*3/uL (ref 0.7–4.0)
MCHC: 33.4 g/dL (ref 30.0–36.0)
MCV: 90.7 fl (ref 78.0–100.0)
Monocytes Absolute: 1 10*3/uL (ref 0.1–1.0)
Monocytes Relative: 6 % (ref 3.0–12.0)
NEUTROS ABS: 12.7 10*3/uL — AB (ref 1.4–7.7)
Neutrophils Relative %: 75.7 % (ref 43.0–77.0)
Platelets: 360 10*3/uL (ref 150.0–400.0)
RBC: 4.51 Mil/uL (ref 3.87–5.11)
RDW: 14.4 % (ref 11.5–15.5)
WBC: 16.7 10*3/uL — AB (ref 4.0–10.5)

## 2015-01-15 LAB — LIPID PANEL
CHOL/HDL RATIO: 3
Cholesterol: 131 mg/dL (ref 0–200)
HDL: 38.3 mg/dL — AB (ref >39.00)
NonHDL: 92.7
TRIGLYCERIDES: 281 mg/dL — AB (ref 0.0–149.0)
VLDL: 56.2 mg/dL — ABNORMAL HIGH (ref 0.0–40.0)

## 2015-01-15 LAB — LDL CHOLESTEROL, DIRECT: LDL DIRECT: 65 mg/dL

## 2015-01-15 LAB — TSH: TSH: 4.17 u[IU]/mL (ref 0.35–4.50)

## 2015-01-15 LAB — MICROALBUMIN / CREATININE URINE RATIO
CREATININE, U: 123.6 mg/dL
MICROALB/CREAT RATIO: 4.7 mg/g (ref 0.0–30.0)
Microalb, Ur: 5.8 mg/dL — ABNORMAL HIGH (ref 0.0–1.9)

## 2015-01-15 LAB — HEMOGLOBIN A1C: Hgb A1c MFr Bld: 8 % — ABNORMAL HIGH (ref 4.6–6.5)

## 2015-01-19 ENCOUNTER — Other Ambulatory Visit: Payer: Self-pay | Admitting: Family Medicine

## 2015-01-22 ENCOUNTER — Ambulatory Visit (INDEPENDENT_AMBULATORY_CARE_PROVIDER_SITE_OTHER): Payer: Commercial Managed Care - HMO | Admitting: Family Medicine

## 2015-01-22 ENCOUNTER — Encounter: Payer: Self-pay | Admitting: Family Medicine

## 2015-01-22 VITALS — BP 138/76 | HR 80 | Temp 97.8°F | Ht 64.0 in | Wt 276.8 lb

## 2015-01-22 DIAGNOSIS — I10 Essential (primary) hypertension: Secondary | ICD-10-CM

## 2015-01-22 DIAGNOSIS — IMO0002 Reserved for concepts with insufficient information to code with codable children: Secondary | ICD-10-CM

## 2015-01-22 DIAGNOSIS — E785 Hyperlipidemia, unspecified: Secondary | ICD-10-CM

## 2015-01-22 DIAGNOSIS — Z0000 Encounter for general adult medical examination without abnormal findings: Secondary | ICD-10-CM

## 2015-01-22 DIAGNOSIS — R0600 Dyspnea, unspecified: Secondary | ICD-10-CM

## 2015-01-22 DIAGNOSIS — F3178 Bipolar disorder, in full remission, most recent episode mixed: Secondary | ICD-10-CM

## 2015-01-22 DIAGNOSIS — E039 Hypothyroidism, unspecified: Secondary | ICD-10-CM

## 2015-01-22 DIAGNOSIS — Z7189 Other specified counseling: Secondary | ICD-10-CM

## 2015-01-22 DIAGNOSIS — Z9989 Dependence on other enabling machines and devices: Secondary | ICD-10-CM

## 2015-01-22 DIAGNOSIS — G4733 Obstructive sleep apnea (adult) (pediatric): Secondary | ICD-10-CM

## 2015-01-22 DIAGNOSIS — B372 Candidiasis of skin and nail: Secondary | ICD-10-CM

## 2015-01-22 DIAGNOSIS — E1165 Type 2 diabetes mellitus with hyperglycemia: Secondary | ICD-10-CM

## 2015-01-22 MED ORDER — NYSTATIN 100000 UNIT/GM EX CREA: 1.0000 "application " | TOPICAL_CREAM | Freq: Two times a day (BID) | CUTANEOUS | Status: DC

## 2015-01-22 MED ORDER — ZIPRASIDONE HCL 40 MG PO CAPS: ORAL_CAPSULE | ORAL | Status: DC

## 2015-01-22 NOTE — Addendum Note (Signed)
Addended by: Ria Bush on: 01/22/2015 01:11 PM   Modules accepted: Miquel Dunn

## 2015-01-22 NOTE — Patient Instructions (Addendum)
Advanced directive packet provided today. Decrease potato chips. Choose healthier alternatives like yogurt to snack on. Return in 3 months for labwork and office visit. Good to see you today, call us with questions.

## 2015-01-22 NOTE — Assessment & Plan Note (Signed)

## 2015-01-22 NOTE — Assessment & Plan Note (Signed)
Actually improved, pt denies significant trouble with this.

## 2015-01-22 NOTE — Progress Notes (Signed)
Pre visit review using our clinic review tool, if applicable. No additional management support is needed unless otherwise documented below in the visit note. 

## 2015-01-22 NOTE — Assessment & Plan Note (Signed)
Chronic, stable on current regimen.  

## 2015-01-22 NOTE — Assessment & Plan Note (Addendum)
On recheck bp better controlled. Continue current regimen.

## 2015-01-22 NOTE — Assessment & Plan Note (Signed)
Advanced directives: does not have set in place. Packet provided. Husband would be HCPOA.

## 2015-01-22 NOTE — Assessment & Plan Note (Addendum)
Not using CPAP. Will need to further discuss this at f/u visit. Recommended return to pulm

## 2015-01-22 NOTE — Progress Notes (Addendum)
BP 138/76 mmHg  Pulse 80  Temp(Src) 97.8 F (36.6 C) (Oral)  Ht '5\' 4"'  (1.626 m)  Wt 276 lb 12 oz (125.533 kg)  BMI 47.48 kg/m2   CC: medicare wellness visit  Subjective:    Patient ID: [name], female    DOB: 15-Dec-1950, 64 y.o.   MRN: [id number]  HPI: [name] is a 64 y.o. female presenting on 01/22/2015 for Annual Exam   DM - nonadherence to diet - lots of potato chips. Planning on stopping potato chips.   Bipolar - out of geodon. Requests refill. Has had difficulty establishing with local psychiatrist. We have discussed pt establishing with psychiatrist.  Intertrigo under breasts, uses athlete's foot cream which helps.   Hearing screen passed. Vision screen with eye doctor No falls Denies depression. H/o bipolar on geodon.  Preventative: COLONOSCOPY Date: 12/2011 hyperplastic polyp x2,. diverticulosis, rec rpt 10 yrs Mammogram WNL 05/2014.Breast exam today. Pap 2015 WNL.rpt Q3 years. S/p total hysterectomy with bilat oophorectomy but cervix remains (fibroids). Bladder sling placed at that time.  Flu 2015 Pneumovax 2012 Td 2012 Shingles shot - 2015 Advanced directives: does not have set in place. Packet provided. Husband would be HCPOA.  Caffeine: 2-3 coffee/am, 1 cup tea in afternoon Lives with husband, 1 dog, 2 cats Occupation: disability since 2009 for bipolar, previously worked at [employer] Activity: nothing  Diet: not many fruits, good vegetables, good amt water, potato chips  Relevant past medical, surgical, family and social history reviewed and updated as indicated. Interim medical history since our last visit reviewed. Allergies and medications reviewed and updated. Current Outpatient Prescriptions on File Prior to Visit  Medication Sig  . ACCU-CHEK FASTCLIX LANCETS MISC Use to check sugar twice daily Dx: E11.65  . aspirin EC 81 MG tablet Take 162 mg by mouth daily.  . Blood Glucose Monitoring Suppl (ACCU-CHEK NANO SMARTVIEW) W/DEVICE KIT  Use to check sugar twice daily Dx: E11.65  . glimepiride (AMARYL) 4 MG tablet Take 1 tablet (4 mg total) by mouth daily with breakfast.  . glucose blood test strip Use as instructed to check sugar twice daily Dx: E11.65 **AccuChek Smartview**  . hydrochlorothiazide (HYDRODIURIL) 25 MG tablet TAKE 1 TABLET EVERY DAY  . Insulin Glargine (LANTUS) 100 UNIT/ML Solostar Pen Inject 50 Units into the skin daily.  . Insulin Pen Needle (NOVOFINE) 30G X 8 MM MISC Inject 10 each into the skin as needed.  Marland Kitchen JANUVIA 100 MG tablet TAKE 1 TABLET EVERY DAY  . levothyroxine (SYNTHROID, LEVOTHROID) 100 MCG tablet TAKE 1 TABLET EVERY DAY  . lisinopril (PRINIVIL,ZESTRIL) 5 MG tablet TAKE 1 TABLET EVERY DAY  . metFORMIN (GLUCOPHAGE) 500 MG tablet TAKE 2 TABLETS IN THE MORNING AND 2 TABLETS AT NIGHT  . metoprolol tartrate (LOPRESSOR) 25 MG tablet Take 0.5 tablets (12.5 mg total) by mouth 2 (two) times daily.  . Multiple Vitamin (MULTIVITAMIN) tablet Take 1 tablet by mouth daily.  . naproxen (NAPROSYN) 500 MG tablet Take 500 mg by mouth daily as needed. Take one po bid x 5 days then prn pain, take with food  . simvastatin (ZOCOR) 20 MG tablet Take 1 tablet (20 mg total) by mouth at bedtime.   No current facility-administered medications on file prior to visit.    Review of Systems  Constitutional: Negative for fever, chills, activity change, appetite change, fatigue and unexpected weight change.  HENT: Negative for hearing loss.   Eyes: Negative for visual disturbance.  Respiratory: Negative for cough, chest tightness,  shortness of breath and wheezing.   Cardiovascular: Negative for chest pain, palpitations and leg swelling.  Gastrointestinal: Negative for nausea, vomiting, abdominal pain, diarrhea, constipation, blood in stool and abdominal distention.  Genitourinary: Negative for hematuria and difficulty urinating.  Musculoskeletal: Negative for myalgias, arthralgias and neck pain.  Skin: Negative for rash.    Neurological: Positive for headaches (occasional migraine). Negative for dizziness, seizures and syncope.  Hematological: Negative for adenopathy. Does not bruise/bleed easily.  Psychiatric/Behavioral: Negative for dysphoric mood. The patient is not nervous/anxious.    Per HPI unless specifically indicated above     Objective:    BP 138/76 mmHg  Pulse 80  Temp(Src) 97.8 F (36.6 C) (Oral)  Ht '5\' 4"'  (1.626 m)  Wt 276 lb 12 oz (125.533 kg)  BMI 47.48 kg/m2  Wt Readings from Last 3 Encounters:  01/22/15 276 lb 12 oz (125.533 kg)  09/17/14 277 lb 8 oz (125.873 kg)  06/16/14 277 lb 8 oz (125.873 kg)    Physical Exam  Constitutional: She is oriented to person, place, and time. She appears well-developed and well-nourished. No distress.  Obesity. Body mass index is 47.48 kg/(m^2).  Regularly ambulates with cane  HENT:  Head: Normocephalic and atraumatic.  Right Ear: Hearing, tympanic membrane, external ear and ear canal normal.  Left Ear: Hearing, tympanic membrane, external ear and ear canal normal.  Nose: Nose normal.  Mouth/Throat: Uvula is midline, oropharynx is clear and moist and mucous membranes are normal. No oropharyngeal exudate, posterior oropharyngeal edema or posterior oropharyngeal erythema.  Eyes: Conjunctivae and EOM are normal. Pupils are equal, round, and reactive to light. No scleral icterus.  Neck: Normal range of motion. Neck supple. Carotid bruit is not present. No thyromegaly present.  Cardiovascular: Normal rate, regular rhythm, normal heart sounds and intact distal pulses.   No murmur heard. Pulses:      Radial pulses are 2+ on the right side, and 2+ on the left side.  Pulmonary/Chest: Effort normal and breath sounds normal. No respiratory distress. She has no wheezes. She has no rales. Right breast exhibits no inverted nipple, no mass, no nipple discharge, no skin change and no tenderness. Left breast exhibits no inverted nipple, no mass, no nipple discharge,  no skin change and no tenderness.  Abdominal: Soft. Bowel sounds are normal. She exhibits no distension and no mass. There is no tenderness. There is no rebound and no guarding.  Musculoskeletal: Normal range of motion. She exhibits no edema.  Lymphadenopathy:       Head (right side): No submental, no submandibular, no tonsillar, no preauricular and no posterior auricular adenopathy present.       Head (left side): No submental, no submandibular, no tonsillar, no preauricular and no posterior auricular adenopathy present.    She has no cervical adenopathy.    She has no axillary adenopathy.       Right axillary: No lateral adenopathy present.       Left axillary: No lateral adenopathy present.      Right: No supraclavicular adenopathy present.       Left: No supraclavicular adenopathy present.  Neurological: She is alert and oriented to person, place, and time.  CN grossly intact, station and gait intact Recall 2/3, 3/3 with cue Calculation 4/5 serial 7s  Skin: Skin is warm and dry. Rash noted.  Erythematous rash bilateral under breasts with satellite lesions present, non pruritic and nontender  Psychiatric: She has a normal mood and affect. Her behavior is normal. Judgment and  thought content normal.  Nursing note and vitals reviewed.  Results for orders placed or performed in visit on 01/15/15  Lipid panel  Result Value Ref Range   Cholesterol 131 0 - 200 mg/dL   Triglycerides 281.0 (H) 0.0 - 149.0 mg/dL   HDL 38.30 (L) >39.00 mg/dL   VLDL 56.2 (H) 0.0 - 40.0 mg/dL   Total CHOL/HDL Ratio 3    NonHDL 92.70   Comprehensive metabolic panel  Result Value Ref Range   Sodium 137 135 - 145 mEq/L   Potassium 4.1 3.5 - 5.1 mEq/L   Chloride 99 96 - 112 mEq/L   CO2 27 19 - 32 mEq/L   Glucose, Bld 159 (H) 70 - 99 mg/dL   BUN 12 6 - 23 mg/dL   Creatinine, Ser 0.75 0.40 - 1.20 mg/dL   Total Bilirubin 0.3 0.2 - 1.2 mg/dL   Alkaline Phosphatase 56 39 - 117 U/L   AST 23 0 - 37 U/L   ALT 28  0 - 35 U/L   Total Protein 7.9 6.0 - 8.3 g/dL   Albumin 4.2 3.5 - 5.2 g/dL   Calcium 9.7 8.4 - 10.5 mg/dL   GFR 83.13 >60.00 mL/min  TSH  Result Value Ref Range   TSH 4.17 0.35 - 4.50 uIU/mL  Hemoglobin A1c  Result Value Ref Range   Hgb A1c MFr Bld 8.0 (H) 4.6 - 6.5 %  CBC with Differential/Platelet  Result Value Ref Range   WBC 16.7 (H) 4.0 - 10.5 K/uL   RBC 4.51 3.87 - 5.11 Mil/uL   Hemoglobin 13.7 12.0 - 15.0 g/dL   HCT 40.9 36.0 - 46.0 %   MCV 90.7 78.0 - 100.0 fl   MCHC 33.4 30.0 - 36.0 g/dL   RDW 14.4 11.5 - 15.5 %   Platelets 360.0 150.0 - 400.0 K/uL   Neutrophils Relative % 75.7 43.0 - 77.0 %   Lymphocytes Relative 18.0 12.0 - 46.0 %   Monocytes Relative 6.0 3.0 - 12.0 %   Eosinophils Relative 0.0 0.0 - 5.0 %   Basophils Relative 0.3 0.0 - 3.0 %   Neutro Abs 12.7 (H) 1.4 - 7.7 K/uL   Lymphs Abs 3.0 0.7 - 4.0 K/uL   Monocytes Absolute 1.0 0.1 - 1.0 K/uL   Eosinophils Absolute 0.0 0.0 - 0.7 K/uL   Basophils Absolute 0.0 0.0 - 0.1 K/uL  Microalbumin / creatinine urine ratio  Result Value Ref Range   Microalb, Ur 5.8 (H) 0.0 - 1.9 mg/dL   Creatinine,U 123.6 mg/dL   Microalb Creat Ratio 4.7 0.0 - 30.0 mg/g  LDL cholesterol, direct  Result Value Ref Range   Direct LDL 65.0 mg/dL      Assessment & Plan:   Problem List Items Addressed This Visit    Advanced care planning/counseling discussion    Advanced directives: does not have set in place. Packet provided. Husband would be HCPOA.      Bipolar disorder    Refilled geodon. Continue to encourage she establish with local psychiatrist.      Candidal intertrigo    Treat with nystatin cream sent to pharmacy. Update if persistent sxs.      Relevant Medications   nystatin cream (MYCOSTATIN)   Diabetes type 2, uncontrolled    Continues slowly improving. No changes today. Pt will stop snacking on potato chips. RTC 3 mo DM f/u      Dyspnea    Actually improved, pt denies significant trouble with this.  Health maintenance examination    Preventative protocols reviewed and updated unless pt declined. Discussed healthy diet and lifestyle.       HLD (hyperlipidemia)    Chronic, continue simvastatin. LDL at goal. Discussed diet changes to improve trig.      Hypertension    On recheck bp better controlled. Continue current regimen.      Hypothyroidism    Chronic, stable on current regimen.      Medicare annual wellness visit, subsequent - Primary    I have personally reviewed the Medicare Annual Wellness questionnaire and have noted 1. The patient's medical and social history 2. Their use of alcohol, tobacco or illicit drugs 3. Their current medications and supplements 4. The patient's functional ability including ADL's, fall risks, home safety risks and hearing or visual impairment. 5. Diet and physical activity 6. Evidence for depression or mood disorders The patients weight, height, BMI have been recorded in the chart.  Hearing and vision has been addressed. I have made referrals, counseling and provided education to the patient based review of the above and I have provided the pt with a written personalized care plan for preventive services. Provider list updated - see scanned questionairre. Reviewed preventative protocols and updated unless pt declined.       OSA on CPAP    Not using CPAP. Will need to further discuss this at f/u visit. Recommended return to pulm          Follow up plan: Return in about 3 months (around 04/24/2015), or as needed, for follow up visit.

## 2015-01-22 NOTE — Assessment & Plan Note (Signed)
Refilled geodon. Continue to encourage she establish with local psychiatrist.

## 2015-01-22 NOTE — Assessment & Plan Note (Signed)
Preventative protocols reviewed and updated unless pt declined. Discussed healthy diet and lifestyle.  

## 2015-01-22 NOTE — Addendum Note (Signed)
Addended by: Ria Bush on: 01/22/2015 12:31 PM   Modules accepted: Miquel Dunn

## 2015-01-22 NOTE — Assessment & Plan Note (Signed)
Chronic, continue simvastatin. LDL at goal. Discussed diet changes to improve trig.

## 2015-01-22 NOTE — Assessment & Plan Note (Signed)
Treat with nystatin cream sent to pharmacy. Update if persistent sxs.

## 2015-01-22 NOTE — Assessment & Plan Note (Addendum)
Continues slowly improving. No changes today. Pt will stop snacking on potato chips. RTC 3 mo DM f/u

## 2015-01-25 ENCOUNTER — Other Ambulatory Visit: Payer: Self-pay | Admitting: Family Medicine

## 2015-02-17 ENCOUNTER — Other Ambulatory Visit: Payer: Self-pay | Admitting: Family Medicine

## 2015-02-26 ENCOUNTER — Other Ambulatory Visit: Payer: Self-pay | Admitting: Family Medicine

## 2015-03-11 LAB — HM DIABETES EYE EXAM

## 2015-03-17 ENCOUNTER — Other Ambulatory Visit: Payer: Self-pay | Admitting: Family Medicine

## 2015-03-22 ENCOUNTER — Other Ambulatory Visit: Payer: Self-pay | Admitting: Family Medicine

## 2015-03-22 NOTE — Telephone Encounter (Signed)
Ok to refill? You had wanted her to establish with psych, but having trouble finding one that accepts her insurance.

## 2015-04-06 ENCOUNTER — Encounter: Payer: Self-pay | Admitting: Family Medicine

## 2015-04-08 ENCOUNTER — Telehealth: Payer: Self-pay

## 2015-04-08 NOTE — Telephone Encounter (Signed)
Pt left v/m; Insulin for 3 month supply cost to pt now >$500.00 and Januvia for 3 month supply cost to pt now is $352.00. Med was changed to a different tier. pt's annual exam 01/22/2015. Suggested to pt about getting assistance for meds thru [place] and pt is not eligible due to household income. Advised pt to call [facility] about less expensive insulin;Pt has [place] for ins coverage and pt is required to go thru mail order pharmacy. Pt will contact ins co to see if more affordable med is on a different tier and will cb today with name of med.

## 2015-04-08 NOTE — Telephone Encounter (Signed)
Pt checked with ins co. And pt did not change tiers but pt is in donut hole. Pt said her husband paid for her medicine and does not need anything further at this time.

## 2015-04-23 ENCOUNTER — Other Ambulatory Visit: Payer: Self-pay

## 2015-04-23 DIAGNOSIS — Z1231 Encounter for screening mammogram for malignant neoplasm of breast: Secondary | ICD-10-CM

## 2015-04-25 ENCOUNTER — Other Ambulatory Visit: Payer: Self-pay | Admitting: Family Medicine

## 2015-04-25 DIAGNOSIS — E1165 Type 2 diabetes mellitus with hyperglycemia: Secondary | ICD-10-CM

## 2015-04-25 DIAGNOSIS — D72829 Elevated white blood cell count, unspecified: Secondary | ICD-10-CM

## 2015-04-25 DIAGNOSIS — IMO0002 Reserved for concepts with insufficient information to code with codable children: Secondary | ICD-10-CM

## 2015-04-28 ENCOUNTER — Other Ambulatory Visit (INDEPENDENT_AMBULATORY_CARE_PROVIDER_SITE_OTHER): Payer: Commercial Managed Care - HMO

## 2015-04-28 DIAGNOSIS — D72829 Elevated white blood cell count, unspecified: Secondary | ICD-10-CM

## 2015-04-28 DIAGNOSIS — E1165 Type 2 diabetes mellitus with hyperglycemia: Secondary | ICD-10-CM | POA: Diagnosis not present

## 2015-04-28 DIAGNOSIS — IMO0002 Reserved for concepts with insufficient information to code with codable children: Secondary | ICD-10-CM

## 2015-04-28 LAB — CBC WITH DIFFERENTIAL/PLATELET
BASOS PCT: 0.3 % (ref 0.0–3.0)
Basophils Absolute: 0.1 10*3/uL (ref 0.0–0.1)
EOS ABS: 0 10*3/uL (ref 0.0–0.7)
Eosinophils Relative: 0 % (ref 0.0–5.0)
HCT: 40.7 % (ref 36.0–46.0)
HEMOGLOBIN: 13.4 g/dL (ref 12.0–15.0)
LYMPHS ABS: 3.8 10*3/uL (ref 0.7–4.0)
Lymphocytes Relative: 23.1 % (ref 12.0–46.0)
MCHC: 33 g/dL (ref 30.0–36.0)
MCV: 90.6 fl (ref 78.0–100.0)
MONO ABS: 0.8 10*3/uL (ref 0.1–1.0)
Monocytes Relative: 4.6 % (ref 3.0–12.0)
NEUTROS PCT: 72 % (ref 43.0–77.0)
Neutro Abs: 11.9 10*3/uL — ABNORMAL HIGH (ref 1.4–7.7)
PLATELETS: 368 10*3/uL (ref 150.0–400.0)
RBC: 4.49 Mil/uL (ref 3.87–5.11)
RDW: 14.1 % (ref 11.5–15.5)
WBC: 16.5 10*3/uL — AB (ref 4.0–10.5)

## 2015-04-28 LAB — HEMOGLOBIN A1C: HEMOGLOBIN A1C: 9.4 % — AB (ref 4.6–6.5)

## 2015-04-29 LAB — PATHOLOGIST SMEAR REVIEW

## 2015-04-30 ENCOUNTER — Encounter: Payer: Self-pay | Admitting: Family Medicine

## 2015-04-30 ENCOUNTER — Ambulatory Visit (INDEPENDENT_AMBULATORY_CARE_PROVIDER_SITE_OTHER): Payer: Commercial Managed Care - HMO | Admitting: Family Medicine

## 2015-04-30 VITALS — BP 126/74 | HR 100 | Temp 97.8°F | Wt 277.2 lb

## 2015-04-30 DIAGNOSIS — Z23 Encounter for immunization: Secondary | ICD-10-CM

## 2015-04-30 DIAGNOSIS — D729 Disorder of white blood cells, unspecified: Secondary | ICD-10-CM | POA: Insufficient documentation

## 2015-04-30 DIAGNOSIS — E1165 Type 2 diabetes mellitus with hyperglycemia: Secondary | ICD-10-CM | POA: Diagnosis not present

## 2015-04-30 DIAGNOSIS — I10 Essential (primary) hypertension: Secondary | ICD-10-CM | POA: Diagnosis not present

## 2015-04-30 DIAGNOSIS — D72829 Elevated white blood cell count, unspecified: Secondary | ICD-10-CM | POA: Diagnosis not present

## 2015-04-30 DIAGNOSIS — IMO0002 Reserved for concepts with insufficient information to code with codable children: Secondary | ICD-10-CM

## 2015-04-30 MED ORDER — NAPROXEN 500 MG PO TABS: 500.0000 mg | ORAL_TABLET | Freq: Every day | ORAL | Status: DC | PRN

## 2015-04-30 MED ORDER — GLIMEPIRIDE 4 MG PO TABS: ORAL_TABLET | ORAL | Status: DC

## 2015-04-30 NOTE — Assessment & Plan Note (Signed)
Benign periph smear, pending SPEP.

## 2015-04-30 NOTE — Assessment & Plan Note (Signed)
Deteriorated. Off januvia for a few weeks but back on for last 3+ wks, persistent high readings. Will increase amaryl to 8mg  with breakfast, and pt will update me in 1-2 weeks if persistently high readings for addition of actos. She will continue [place] and lantus for now (just filled both) but call me prior to running out to explore cheaper alternatives.

## 2015-04-30 NOTE — Progress Notes (Signed)
BP 126/74 mmHg  Pulse 100  Temp(Src) 97.8 F (36.6 C) (Oral)  Wt 277 lb 4 oz (125.76 kg)   CC: f/u visit  Subjective:    Patient ID: [name], female    DOB: 04-Jul-1951, 64 y.o.   MRN: [id number]  HPI: [name] is a 64 y.o. female presenting on 04/30/2015 for Follow-up   DM - regularly does check sugars 200-300s. Compliant with antihyperglycemic regimen which includes: glimepiride 4m, januvia 1038m lantus 50units daily, metformin 100048mid. Denies low sugars or hypoglycemic symptoms. Denies paresthesias. In donut hole - janTongas 300$+ so she was unable to fill. When she noticed sugars running 200-300s, she decided to fill janTongaLab Results  Component Value Date   HGBA1C 9.4* 04/28/2015     Relevant past medical, surgical, family and social history reviewed and updated as indicated. Interim medical history since our last visit reviewed. Allergies and medications reviewed and updated. Current Outpatient Prescriptions on File Prior to Visit  Medication Sig  . ACCU-CHEK FASTCLIX LANCETS MISC Use to check sugar twice daily Dx: E11.65  . aspirin EC 81 MG tablet Take 162 mg by mouth daily.  . Blood Glucose Monitoring Suppl (ACCU-CHEK NANO SMARTVIEW) W/DEVICE KIT Use to check sugar twice daily Dx: E11.65  . famotidine (PEPCID) 20 MG tablet Take 20 mg by mouth daily.  . gMarland Kitchenimepiride (AMARYL) 4 MG tablet TAKE 1 TABLET EVERY DAY WITH BREAKFAST  . glucose blood (ACCU-CHEK SMARTVIEW) test strip Use as instructed to check sugar twice daily. **Accu-chek Smartview** Dx: E11.65  . hydrochlorothiazide (HYDRODIURIL) 25 MG tablet TAKE 1 TABLET EVERY DAY  . Insulin Glargine (LANTUS) 100 UNIT/ML Solostar Pen Inject 50 Units into the skin daily.  . JMarland KitchenNUVIA 100 MG tablet TAKE 1 TABLET EVERY DAY  . levothyroxine (SYNTHROID, LEVOTHROID) 100 MCG tablet TAKE 1 TABLET EVERY DAY  . lisinopril (PRINIVIL,ZESTRIL) 5 MG tablet TAKE 1 TABLET EVERY DAY  . metFORMIN (GLUCOPHAGE) 500 MG tablet  TAKE 2 TABLETS IN THE MORNING AND 2 TABLETS AT NIGHT  . metoprolol tartrate (LOPRESSOR) 25 MG tablet Take 0.5 tablets (12.5 mg total) by mouth 2 (two) times daily.  . Multiple Vitamin (MULTIVITAMIN) tablet Take 1 tablet by mouth daily.  . NMarland KitchenVOFINE 30G X 8 MM MISC USE ONE TIME DAILY  WITH  LANTUS  PEN  . nystatin cream (MYCOSTATIN) Apply 1 application topically 2 (two) times daily.  . simvastatin (ZOCOR) 20 MG tablet Take 1 tablet (20 mg total) by mouth at bedtime.  . ziprasidone (GEODON) 40 MG capsule TAKE 1 CAPSULE TWICE DAILY WITH MEALS  . naproxen (NAPROSYN) 500 MG tablet Take 500 mg by mouth daily as needed. Take one po bid x 5 days then prn pain, take with food   No current facility-administered medications on file prior to visit.    Review of Systems Per HPI unless specifically indicated above     Objective:    BP 126/74 mmHg  Pulse 100  Temp(Src) 97.8 F (36.6 C) (Oral)  Wt 277 lb 4 oz (125.76 kg)  Wt Readings from Last 3 Encounters:  04/30/15 277 lb 4 oz (125.76 kg)  01/22/15 276 lb 12 oz (125.533 kg)  09/17/14 277 lb 8 oz (125.873 kg)    Physical Exam  Constitutional: She appears well-developed and well-nourished. No distress.  HENT:  Mouth/Throat: Oropharynx is clear and moist. No oropharyngeal exudate.  Cardiovascular: Normal rate, regular rhythm, normal heart sounds and intact distal pulses.   No murmur heard.  Pulmonary/Chest: Effort normal and breath sounds normal. No respiratory distress. She has no wheezes. She has no rales.  Musculoskeletal: She exhibits no edema.  Skin: Skin is warm and dry. No rash noted.  Psychiatric: She has a normal mood and affect.  Nursing note and vitals reviewed.  Results for orders placed or performed in visit on 04/28/15  CBC with Differential/Platelet  Result Value Ref Range   WBC 16.5 (H) 4.0 - 10.5 K/uL   RBC 4.49 3.87 - 5.11 Mil/uL   Hemoglobin 13.4 12.0 - 15.0 g/dL   HCT 40.7 36.0 - 46.0 %   MCV 90.6 78.0 - 100.0 fl    MCHC 33.0 30.0 - 36.0 g/dL   RDW 14.1 11.5 - 15.5 %   Platelets 368.0 150.0 - 400.0 K/uL   Neutrophils Relative % 72.0 43.0 - 77.0 %   Lymphocytes Relative 23.1 12.0 - 46.0 %   Monocytes Relative 4.6 3.0 - 12.0 %   Eosinophils Relative 0.0 0.0 - 5.0 %   Basophils Relative 0.3 0.0 - 3.0 %   Neutro Abs 11.9 (H) 1.4 - 7.7 K/uL   Lymphs Abs 3.8 0.7 - 4.0 K/uL   Monocytes Absolute 0.8 0.1 - 1.0 K/uL   Eosinophils Absolute 0.0 0.0 - 0.7 K/uL   Basophils Absolute 0.1 0.0 - 0.1 K/uL  Hemoglobin A1c  Result Value Ref Range   Hgb A1c MFr Bld 9.4 (H) 4.6 - 6.5 %  Pathologist smear review  Result Value Ref Range   Path Review SEE NOTE       Assessment & Plan:   Problem List Items Addressed This Visit    Diabetes type 2, uncontrolled - Primary    Deteriorated. Off januvia for a few weeks but back on for last 3+ wks, persistent high readings. Will increase amaryl to 54m with breakfast, and pt will update me in 1-2 weeks if persistently high readings for addition of actos. She will continue jTongaand lantus for now (just filled both) but call me prior to running out to explore cheaper alternatives.      Hypertension    Chronic, stable. Continue current regimen.      Leukocytosis    Benign periph smear, pending SPEP.       Other Visit Diagnoses    Need for prophylactic vaccination and inoculation against influenza        Relevant Orders    Flu Vaccine QUAD 36+ mos PF IM (Fluarix & Fluzone Quad PF)        Follow up plan: Return in about 3 months (around 07/30/2015), or as needed, for follow up visit.

## 2015-04-30 NOTE — Assessment & Plan Note (Signed)
Chronic, stable. Continue current regimen. 

## 2015-04-30 NOTE — Patient Instructions (Addendum)
Let me know when you're running low on lantus to price out different insulin(toujeo or levemir).  Let me know when you're running low on januvia and we may try actos instead (cheaper medicine).  Increase glimepiride to 2 in the morning with breakfast and continue metformin.  Let me know sooner if persistent high sugars 200-300.  Return in 3 months for follow up visit.

## 2015-04-30 NOTE — Progress Notes (Signed)
Pre visit review using our clinic review tool, if applicable. No additional management support is needed unless otherwise documented below in the visit note. 

## 2015-05-03 LAB — PROTEIN ELECTROPHORESIS, SERUM, WITH REFLEX
ALBUMIN ELP: 4 g/dL (ref 3.8–4.8)
ALPHA-1-GLOBULIN: 0.4 g/dL — AB (ref 0.2–0.3)
ALPHA-2-GLOBULIN: 1.1 g/dL — AB (ref 0.5–0.9)
Beta 2: 0.5 g/dL (ref 0.2–0.5)
Beta Globulin: 0.6 g/dL (ref 0.4–0.6)
GAMMA GLOBULIN: 1.3 g/dL (ref 0.8–1.7)
Total Protein, Serum Electrophoresis: 7.8 g/dL (ref 6.1–8.1)

## 2015-05-04 ENCOUNTER — Telehealth: Payer: Self-pay | Admitting: *Deleted

## 2015-05-04 NOTE — Telephone Encounter (Signed)
Clarification sent to [facility].

## 2015-05-04 NOTE — Telephone Encounter (Signed)
Clarified and in [place]' box. Should be 1 BID x5d with food then BID PRN pain.

## 2015-05-04 NOTE — Telephone Encounter (Signed)
Confirmation from pharmacy required for Naproxen. In your IN box for review.

## 2015-05-19 ENCOUNTER — Other Ambulatory Visit: Payer: Self-pay | Admitting: Family Medicine

## 2015-05-27 ENCOUNTER — Ambulatory Visit
Admission: RE | Admit: 2015-05-27 | Discharge: 2015-05-27 | Disposition: A | Discharge status: Home / Self Care | Payer: Commercial Managed Care - HMO | Source: Ambulatory Visit

## 2015-05-27 DIAGNOSIS — Z1231 Encounter for screening mammogram for malignant neoplasm of breast: Secondary | ICD-10-CM

## 2015-05-28 ENCOUNTER — Other Ambulatory Visit: Payer: Self-pay | Admitting: Family Medicine

## 2015-05-28 DIAGNOSIS — R928 Other abnormal and inconclusive findings on diagnostic imaging of breast: Secondary | ICD-10-CM

## 2015-06-04 ENCOUNTER — Ambulatory Visit
Admission: RE | Admit: 2015-06-04 | Discharge: 2015-06-04 | Disposition: A | Discharge status: Home / Self Care | Payer: Commercial Managed Care - HMO | Source: Ambulatory Visit | Attending: Family Medicine | Admitting: Family Medicine

## 2015-06-04 DIAGNOSIS — R928 Other abnormal and inconclusive findings on diagnostic imaging of breast: Secondary | ICD-10-CM

## 2015-06-28 ENCOUNTER — Telehealth: Payer: Self-pay | Admitting: *Deleted

## 2015-06-28 NOTE — Telephone Encounter (Signed)
Received jury summons. Requests letter to be excused due to physical limitations. In your IN box for review. Call when ready to pick up.

## 2015-07-01 NOTE — Telephone Encounter (Signed)
Letter in Kim's box.

## 2015-07-01 NOTE — Telephone Encounter (Signed)
Husband notified and letter placed up front for pick up.

## 2015-07-12 ENCOUNTER — Encounter: Payer: Self-pay | Admitting: Family Medicine

## 2015-07-13 MED ORDER — PIOGLITAZONE HCL 30 MG PO TABS: 30.0000 mg | ORAL_TABLET | Freq: Every day | ORAL | Status: DC

## 2015-07-19 ENCOUNTER — Other Ambulatory Visit: Payer: Self-pay | Admitting: Family Medicine

## 2015-07-19 NOTE — Telephone Encounter (Signed)
Ok to refill 

## 2015-07-30 ENCOUNTER — Encounter: Payer: Self-pay | Admitting: Family Medicine

## 2015-07-30 ENCOUNTER — Ambulatory Visit (INDEPENDENT_AMBULATORY_CARE_PROVIDER_SITE_OTHER): Payer: Commercial Managed Care - HMO | Admitting: Family Medicine

## 2015-07-30 VITALS — BP 126/76 | HR 108 | Temp 98.1°F | Wt 275.0 lb

## 2015-07-30 DIAGNOSIS — Z114 Encounter for screening for human immunodeficiency virus [HIV]: Secondary | ICD-10-CM

## 2015-07-30 DIAGNOSIS — E1165 Type 2 diabetes mellitus with hyperglycemia: Secondary | ICD-10-CM

## 2015-07-30 DIAGNOSIS — I10 Essential (primary) hypertension: Secondary | ICD-10-CM

## 2015-07-30 DIAGNOSIS — E118 Type 2 diabetes mellitus with unspecified complications: Secondary | ICD-10-CM | POA: Diagnosis not present

## 2015-07-30 DIAGNOSIS — Z794 Long term (current) use of insulin: Secondary | ICD-10-CM

## 2015-07-30 DIAGNOSIS — IMO0002 Reserved for concepts with insufficient information to code with codable children: Secondary | ICD-10-CM

## 2015-07-30 DIAGNOSIS — E785 Hyperlipidemia, unspecified: Secondary | ICD-10-CM

## 2015-07-30 DIAGNOSIS — Z1159 Encounter for screening for other viral diseases: Secondary | ICD-10-CM | POA: Diagnosis not present

## 2015-07-30 LAB — BASIC METABOLIC PANEL
BUN: 14 mg/dL (ref 7–25)
CO2: 27 mmol/L (ref 20–31)
CREATININE: 0.93 mg/dL (ref 0.50–0.99)
Calcium: 9.8 mg/dL (ref 8.6–10.4)
Chloride: 97 mmol/L — ABNORMAL LOW (ref 98–110)
Glucose, Bld: 221 mg/dL — ABNORMAL HIGH (ref 65–99)
Potassium: 4.4 mmol/L (ref 3.5–5.3)
Sodium: 139 mmol/L (ref 135–146)

## 2015-07-30 LAB — HEMOGLOBIN A1C
HEMOGLOBIN A1C: 9.3 % — AB (ref <5.7)
Mean Plasma Glucose: 220 mg/dL — ABNORMAL HIGH (ref <117)

## 2015-07-30 NOTE — Progress Notes (Signed)
Pre visit review using our clinic review tool, if applicable. No additional management support is needed unless otherwise documented below in the visit note. 

## 2015-07-30 NOTE — Patient Instructions (Addendum)
No med changes today. Stop Tong[place] when you run out, ok to start actos now. Labs today. Return in 4 months for follow up

## 2015-07-30 NOTE — Assessment & Plan Note (Signed)
Chronic, stable. Continue simvastatin.  

## 2015-07-30 NOTE — Assessment & Plan Note (Signed)
Improved based on recall CBGs. Check A1c today. Pending transition from [place] to actos 2/2 cost. Already on max dose other medications - will likely need titration of lantus. Will update plan based on A1c results today. Pt agrees.

## 2015-07-30 NOTE — Progress Notes (Addendum)
BP 126/76 mmHg  Pulse 108  Temp(Src) 98.1 F (36.7 C) (Oral)  Wt 275 lb (124.739 kg)   CC: 3 mo f/u visit  Subjective:    Patient ID: [name], female    DOB: 02/23/1951, 64 y.o.   MRN: [id number]  HPI: [name] is a 64 y.o. female presenting on 07/30/2015 for Follow-up   DM - regularly does check sugars. Compliant with antihyperglycemic regimen which includes: glimepiride 29m daily, januvia 1061mdaily, lantus 50u daily, metformin 100028mid. last visit in [employer] [place] - Januvia was $300. Last visit we increased glimepiride to 8mg72mily. With persistent elevated readings we started actos 30mg19mly - pt to start this after she [place] noticing fasting sugars 170-190. PM after dinner 240s. Did not bring log or meter. Denies low sugars or hypoglycemic symptoms. Denies paresthesias. Last diabetic eye exam was 02/2015, has f/u appt on Friday. Pneumovax: 03/2011. Prevnar: not due yet. Requests labs today. Lab Results  Component Value Date   HGBA1C 9.4* 04/28/2015   Diabetic Foot Exam - Simple   Simple Foot Form  Diabetic Foot exam was performed with the following findings:  Yes 07/30/2015  4:34 PM  Visual Inspection  No deformities, no ulcerations, no other skin breakdown bilaterally:  Yes  Sensation Testing  Intact to touch and monofilament testing bilaterally:  Yes  Pulse Check  See comments:  Yes  Comments  Diminished pulses in feet        HTN - Compliant with current antihypertensive regimen of lisinpril 5, hctz 25, metoprolol 12.5mg b32m  Does check blood pressures at home: good control.  No low blood pressure readings or symptoms of dizziness/syncope. Denies HA, vision changes, CP/tightness, SOB, leg swelling.   HLD - complaint with simvastatin without myalgias.   Relevant past medical, surgical, family and social history reviewed and updated as indicated. Interim medical history since our last visit reviewed. Allergies and medications reviewed and  updated. Current Outpatient Prescriptions on File Prior to Visit  Medication Sig  . ACCU-CHEK FASTCLIX LANCETS MISC Use to check sugar twice daily Dx: E11.65  . aspirin EC 81 MG tablet Take 162 mg by mouth daily.  . Blood Glucose Monitoring Suppl (ACCU-CHEK NANO SMARTVIEW) W/DEVICE KIT Use to check sugar twice daily Dx: E11.65  . famotidine (PEPCID) 20 MG tablet Take 20 mg by mouth daily.  . glimMarland Kitchenpiride (AMARYL) 4 MG tablet TAKE 2 TABLETS EVERY DAY WITH BREAKFAST  . glucose blood (ACCU-CHEK SMARTVIEW) test strip Use as instructed to check sugar twice daily. **Accu-chek Smartview** Dx: E11.65  . hydrochlorothiazide (HYDRODIURIL) 25 MG tablet TAKE 1 TABLET EVERY DAY  . Insulin Glargine (LANTUS) 100 UNIT/ML Solostar Pen Inject 50 Units into the skin daily.  . levoMarland Kitchenhyroxine (SYNTHROID, LEVOTHROID) 100 MCG tablet TAKE 1 TABLET EVERY DAY  . lisinopril (PRINIVIL,ZESTRIL) 5 MG tablet TAKE 1 TABLET EVERY DAY  . metFORMIN (GLUCOPHAGE) 500 MG tablet TAKE 2 TABLETS IN THE MORNING AND 2 TABLETS AT NIGHT  . metoprolol tartrate (LOPRESSOR) 25 MG tablet Take 0.5 tablets (12.5 mg total) by mouth 2 (two) times daily.  . Multiple Vitamin (MULTIVITAMIN) tablet Take 1 tablet by mouth daily.  . naproxen (NAPROSYN) 500 MG tablet Take 1 tablet (500 mg total) by mouth daily as needed. Take one po bid x 5 days then prn pain, take with food  . NOVOFINE 30G X 8 MM MISC USE ONE TIME DAILY  WITH  LANTUS  PEN  . nystatin cream (MYCOSTATIN) Apply  1 application topically 2 (two) times daily.  . simvastatin (ZOCOR) 20 MG tablet Take 1 tablet (20 mg total) by mouth at bedtime.  . ziprasidone (GEODON) 40 MG capsule TAKE 1 CAPSULE TWICE DAILY WITH MEALS  . pioglitazone (ACTOS) 30 MG tablet Take 1 tablet (30 mg total) by mouth daily. (Patient not taking: Reported on 07/30/2015)   No current facility-administered medications on file prior to visit.    Review of Systems Per HPI unless specifically indicated in ROS section       Objective:    BP 126/76 mmHg  Pulse 108  Temp(Src) 98.1 F (36.7 C) (Oral)  Wt 275 lb (124.739 kg)  Wt Readings from Last 3 Encounters:  07/30/15 275 lb (124.739 kg)  04/30/15 277 lb 4 oz (125.76 kg)  01/22/15 276 lb 12 oz (125.533 kg)   Body mass index is 47.18 kg/(m^2).  Physical Exam  Constitutional: She appears well-developed and well-nourished. No distress.  HENT:  Head: Normocephalic and atraumatic.  Right Ear: External ear normal.  Left Ear: External ear normal.  Nose: Nose normal.  Mouth/Throat: Oropharynx is clear and moist. No oropharyngeal exudate.  Eyes: Conjunctivae and EOM are normal. Pupils are equal, round, and reactive to light. No scleral icterus.  Neck: Normal range of motion. Neck supple.  Cardiovascular: Regular rhythm, normal heart sounds and intact distal pulses.  Tachycardia present.   No murmur heard. Pulmonary/Chest: Effort normal and breath sounds normal. No respiratory distress. She has no wheezes. She has no rales.  Musculoskeletal: She exhibits no edema.  See HPI for foot exam if done  Lymphadenopathy:    She has no cervical adenopathy.  Skin: Skin is warm and dry. No rash noted.  Psychiatric: She has a normal mood and affect.  Nursing note and vitals reviewed.  Results for orders placed or performed in visit on 04/28/15  CBC with Differential/Platelet  Result Value Ref Range   WBC 16.5 (H) 4.0 - 10.5 K/uL   RBC 4.49 3.87 - 5.11 Mil/uL   Hemoglobin 13.4 12.0 - 15.0 g/dL   HCT 40.7 36.0 - 46.0 %   MCV 90.6 78.0 - 100.0 fl   MCHC 33.0 30.0 - 36.0 g/dL   RDW 14.1 11.5 - 15.5 %   Platelets 368.0 150.0 - 400.0 K/uL   Neutrophils Relative % 72.0 43.0 - 77.0 %   Lymphocytes Relative 23.1 12.0 - 46.0 %   Monocytes Relative 4.6 3.0 - 12.0 %   Eosinophils Relative 0.0 0.0 - 5.0 %   Basophils Relative 0.3 0.0 - 3.0 %   Neutro Abs 11.9 (H) 1.4 - 7.7 K/uL   Lymphs Abs 3.8 0.7 - 4.0 K/uL   Monocytes Absolute 0.8 0.1 - 1.0 K/uL   Eosinophils  Absolute 0.0 0.0 - 0.7 K/uL   Basophils Absolute 0.1 0.0 - 0.1 K/uL  Hemoglobin A1c  Result Value Ref Range   Hgb A1c MFr Bld 9.4 (H) 4.6 - 6.5 %  Serum protein electrophoresis with reflex  Result Value Ref Range   Total Protein, Serum Electrophoresis 7.8 6.1 - 8.1 g/dL   Albumin ELP 4.0 3.8 - 4.8 g/dL   Alpha-1-Globulin 0.4 (H) 0.2 - 0.3 g/dL   Alpha-2-Globulin 1.1 (H) 0.5 - 0.9 g/dL   Beta Globulin 0.6 0.4 - 0.6 g/dL   Beta 2 0.5 0.2 - 0.5 g/dL   Gamma Globulin 1.3 0.8 - 1.7 g/dL   Abnormal Protein Band1 NOT DET g/dL   SPE Interp. SEE NOTE    COMMENT (  PROTEIN ELECTROPHOR) SEE NOTE    Abnormal Protein Band2 NOT DET g/dL   Abnormal Protein Band3 NOT DET g/dL  Pathologist smear review  Result Value Ref Range   Path Review SEE NOTE    Lab Results  Component Value Date   TSH 4.17 01/15/2015       Assessment & Plan:   Problem List Items Addressed This Visit    Hypertension    Chronic, stable. Continue current regimen.      HLD (hyperlipidemia)    Chronic, stable. Continue simvastatin.       Diabetes type 2, uncontrolled ([place]) - Primary    Improved based on recall CBGs. Check A1c today. Pending transition from [place] to actos 2/2 cost. Already on max dose other medications - will likely need titration of lantus. Will update plan based on A1c results today. Pt agrees.      Relevant Orders   Hemoglobin O0B   Basic metabolic panel    Other Visit Diagnoses    Screening for HIV (human immunodeficiency virus)        Relevant Orders    HIV antibody    Need for hepatitis C screening test        Relevant Orders    Hepatitis C antibody, reflex        Follow up plan: Return in about 4 months (around 11/28/2015), or as needed, for follow up visit.

## 2015-07-30 NOTE — Assessment & Plan Note (Signed)
Chronic, stable. Continue current regimen. 

## 2015-07-31 LAB — HIV ANTIBODY (ROUTINE TESTING W REFLEX): HIV: NONREACTIVE

## 2015-07-31 LAB — HEPATITIS C ANTIBODY: HCV AB: NEGATIVE

## 2015-07-31 NOTE — Addendum Note (Signed)
Addended by: Ria Bush on: 07/31/2015 04:42 PM   Modules accepted: Orders

## 2015-08-11 ENCOUNTER — Other Ambulatory Visit: Payer: Self-pay | Admitting: Family Medicine

## 2015-08-16 ENCOUNTER — Other Ambulatory Visit: Payer: Self-pay | Admitting: Family Medicine

## 2015-08-25 ENCOUNTER — Other Ambulatory Visit: Payer: Self-pay | Admitting: Family Medicine

## 2015-08-31 ENCOUNTER — Encounter: Payer: Self-pay | Admitting: Family Medicine

## 2015-08-31 MED ORDER — INSULIN GLARGINE 100 UNIT/ML SOLOSTAR PEN: 55.0000 [IU] | PEN_INJECTOR | Freq: Every day | SUBCUTANEOUS | Status: DC

## 2015-09-13 ENCOUNTER — Other Ambulatory Visit: Payer: Self-pay | Admitting: Family Medicine

## 2015-09-21 ENCOUNTER — Other Ambulatory Visit: Payer: Self-pay | Admitting: Family Medicine

## 2015-09-22 NOTE — Telephone Encounter (Signed)
Ok to refill 

## 2015-10-04 ENCOUNTER — Other Ambulatory Visit: Payer: Self-pay | Admitting: Family Medicine

## 2015-11-20 LAB — HM DIABETES EYE EXAM

## 2015-11-29 ENCOUNTER — Encounter: Payer: Self-pay | Admitting: Family Medicine

## 2015-11-29 ENCOUNTER — Ambulatory Visit (INDEPENDENT_AMBULATORY_CARE_PROVIDER_SITE_OTHER): Payer: Commercial Managed Care - HMO | Admitting: Family Medicine

## 2015-11-29 VITALS — BP 120/64 | HR 100 | Temp 97.6°F | Ht 64.0 in | Wt 297.2 lb

## 2015-11-29 DIAGNOSIS — I10 Essential (primary) hypertension: Secondary | ICD-10-CM

## 2015-11-29 DIAGNOSIS — E785 Hyperlipidemia, unspecified: Secondary | ICD-10-CM

## 2015-11-29 DIAGNOSIS — R0600 Dyspnea, unspecified: Secondary | ICD-10-CM | POA: Diagnosis not present

## 2015-11-29 DIAGNOSIS — E039 Hypothyroidism, unspecified: Secondary | ICD-10-CM

## 2015-11-29 DIAGNOSIS — E1165 Type 2 diabetes mellitus with hyperglycemia: Secondary | ICD-10-CM

## 2015-11-29 DIAGNOSIS — Z794 Long term (current) use of insulin: Secondary | ICD-10-CM

## 2015-11-29 DIAGNOSIS — E118 Type 2 diabetes mellitus with unspecified complications: Secondary | ICD-10-CM

## 2015-11-29 DIAGNOSIS — IMO0002 Reserved for concepts with insufficient information to code with codable children: Secondary | ICD-10-CM

## 2015-11-29 LAB — BASIC METABOLIC PANEL
BUN: 18 mg/dL (ref 6–23)
CALCIUM: 10 mg/dL (ref 8.4–10.5)
CO2: 29 mEq/L (ref 19–32)
CREATININE: 0.85 mg/dL (ref 0.40–1.20)
Chloride: 100 mEq/L (ref 96–112)
GFR: 71.75 mL/min (ref >60.00)
GLUCOSE: 204 mg/dL — AB (ref 70–99)
Potassium: 4.3 mEq/L (ref 3.5–5.1)
Sodium: 139 mEq/L (ref 135–145)

## 2015-11-29 LAB — POC URINALSYSI DIPSTICK (AUTOMATED)
BILIRUBIN UA: NEGATIVE
Glucose, UA: NEGATIVE
KETONES UA: NEGATIVE
Leukocytes, UA: NEGATIVE
NITRITE UA: NEGATIVE
Protein, UA: NEGATIVE
RBC UA: NEGATIVE
SPEC GRAV UA: 1.02
Urobilinogen, UA: 0.2
pH, UA: 6

## 2015-11-29 LAB — BRAIN NATRIURETIC PEPTIDE: Pro B Natriuretic peptide (BNP): 23 pg/mL (ref 0.0–100.0)

## 2015-11-29 LAB — TSH: TSH: 4.61 u[IU]/mL — ABNORMAL HIGH (ref 0.35–4.50)

## 2015-11-29 LAB — HEMOGLOBIN A1C: Hgb A1c MFr Bld: 6.7 % — ABNORMAL HIGH (ref 4.6–6.5)

## 2015-11-29 MED ORDER — PIOGLITAZONE HCL 30 MG PO TABS: 30.0000 mg | ORAL_TABLET | Freq: Every day | ORAL | Status: DC

## 2015-11-29 NOTE — Progress Notes (Signed)
Pre visit review using our clinic review tool, if applicable. No additional management support is needed unless otherwise documented below in the visit note. 

## 2015-11-29 NOTE — Assessment & Plan Note (Addendum)
Marked improvement on actos 30mg  - will update A1c today.  However, unfortunately also with marked weight gain. Check BNP today. Will also update UA to r/o hematuria. Continue other regimen (off [place]).

## 2015-11-29 NOTE — Assessment & Plan Note (Signed)
Chronic, stable. Continue current regimen. 

## 2015-11-29 NOTE — Progress Notes (Signed)
BP 120/64 mmHg  Pulse 100  Temp(Src) 97.6 F (36.4 C) (Oral)  Ht _0  (1.626 m)  Wt 297 lb 4 oz (134.832 kg)  BMI 51.00 kg/m2   CC: DM f/u visit  Subjective:    Patient ID: [name], female    DOB: [birth year], 65 y.o.   MRN: [id number]  HPI: [name] is a 65 y.o. female presenting on 11/29/2015 for Follow-up   DM - regularly does check sugars. Compliant with antihyperglycemic regimen which includes: glimepiride 22m daily, januvia 1048mdaily, lantus 55u daily, metformin 100073mid. We also started actos 6m62mily. Weight gain of 22lbs noted without worsening dyspnea or pedal edema. Denies low sugars or hypoglycemic symptoms. Denies paresthesias. Last diabetic eye exam was 02/2015, has f/u appt on Friday. Pneumovax: 03/2011. Prevnar: not due yet.  Lab Results  Component Value Date   HGBA1C 9.3* 07/30/2015    Diabetic Foot Exam - Simple   No data filed    Glucose log - marked improvement over 3 months with actos on board (Started 08/2015). Over the past month, fasting 102-130, post prandial 110-210.   HTN - Compliant with current antihypertensive regimen of lisinpril 5, hctz 25, metoprolol 12.5mg 60m. Does check blood pressures at home: good control.No low blood pressure readings or symptoms of dizziness/syncope. Denies HA, vision changes, CP/tightness, SOB, leg swelling.   HLD - complaint with simvastatin without myalgias.   Chronic dyspnea.   Relevant past medical, surgical, family and social history reviewed and updated as indicated. Interim medical history since our last visit reviewed. Allergies and medications reviewed and updated. Current Outpatient Prescriptions on File Prior to Visit  Medication Sig  . ACCU-CHEK FASTCLIX LANCETS MISC Use to check sugar twice daily Dx: E11.65  . aspirin EC 81 MG tablet Take 162 mg by mouth daily.  . Blood Glucose Monitoring Suppl (ACCU-CHEK NANO SMARTVIEW) W/DEVICE KIT Use to check sugar twice daily Dx: E11.65  .  famotidine (PEPCID) 20 MG tablet Take 20 mg by mouth daily.  . gliMarland Kitchenepiride (AMARYL) 4 MG tablet TAKE 2 TABLETS EVERY DAY WITH BREAKFAST  . glucose blood (ACCU-CHEK SMARTVIEW) test strip Use as instructed to check sugar twice daily. **Accu-chek Smartview** Dx: E11.65  . hydrochlorothiazide (HYDRODIURIL) 25 MG tablet TAKE 1 TABLET EVERY DAY  . Insulin Glargine (LANTUS) 100 UNIT/ML Solostar Pen Inject 55 Units into the skin daily.  . levMarland Kitchenthyroxine (SYNTHROID, LEVOTHROID) 100 MCG tablet TAKE 1 TABLET EVERY DAY  . lisinopril (PRINIVIL,ZESTRIL) 5 MG tablet TAKE 1 TABLET EVERY DAY  . metFORMIN (GLUCOPHAGE) 500 MG tablet TAKE 2 TABLETS IN THE MORNING AND 2 TABLETS AT NIGHT  . metoprolol tartrate (LOPRESSOR) 25 MG tablet TAKE 1/2 TABLET TWICE DAILY  . Multiple Vitamin (MULTIVITAMIN) tablet Take 1 tablet by mouth daily.  . NOVMarland KitchenFINE 30G X 8 MM MISC USE ONE TIME DAILY  WITH  LANTUS  PEN  . nystatin cream (MYCOSTATIN) APPLY TOPICALLY TWICE DAILY (Patient taking differently: APPLY TOPICALLY TWICE DAILY AS NEEDED)  . simvastatin (ZOCOR) 20 MG tablet Take 1 tablet (20 mg total) by mouth at bedtime.  . ziprasidone (GEODON) 40 MG capsule TAKE 1 CAPSULE TWICE DAILY WITH MEALS   No current facility-administered medications on file prior to visit.    Review of Systems Per HPI unless specifically indicated in ROS section     Objective:    BP 120/64 mmHg  Pulse 100  Temp(Src) 97.6 F (36.4 C) (Oral)  Ht _1  (1.626 m)  Wt 297  lb 4 oz (134.832 kg)  BMI 51.00 kg/m2  Wt Readings from Last 3 Encounters:  11/29/15 297 lb 4 oz (134.832 kg)  07/30/15 275 lb (124.739 kg)  04/30/15 277 lb 4 oz (125.76 kg)    Physical Exam  Constitutional: She appears well-developed and well-nourished. No distress.  HENT:  Head: Normocephalic and atraumatic.  Right Ear: External ear normal.  Left Ear: External ear normal.  Nose: Nose normal.  Mouth/Throat: Oropharynx is clear and moist. No oropharyngeal exudate.  Eyes:  Conjunctivae and EOM are normal. Pupils are equal, round, and reactive to light. No scleral icterus.  Neck: Normal range of motion. Neck supple.  Cardiovascular: Normal rate, regular rhythm, normal heart sounds and intact distal pulses.   No murmur heard. Pulmonary/Chest: Effort normal and breath sounds normal. No respiratory distress. She has no wheezes. She has no rales.  Musculoskeletal: She exhibits no edema.  See HPI for foot exam if done  Lymphadenopathy:    She has no cervical adenopathy.  Skin: Skin is warm and dry. No rash noted.  Psychiatric: She has a normal mood and affect.  Nursing note and vitals reviewed.  Results for orders placed or performed in visit on 07/30/15  HIV antibody  Result Value Ref Range   HIV 1&2 Ab, 4th Generation NONREACTIVE NONREACTIVE  Hemoglobin A1c  Result Value Ref Range   Hgb A1c MFr Bld 9.3 (H) <5.7 %   Mean Plasma Glucose 220 (H) <117 mg/dL  Basic metabolic panel  Result Value Ref Range   Sodium 139 135 - 146 mmol/L   Potassium 4.4 3.5 - 5.3 mmol/L   Chloride 97 (L) 98 - 110 mmol/L   CO2 27 20 - 31 mmol/L   Glucose, Bld 221 (H) 65 - 99 mg/dL   BUN 14 7 - 25 mg/dL   Creat 0.93 0.50 - 0.99 mg/dL   Calcium 9.8 8.6 - 10.4 mg/dL  Hepatitis C antibody  Result Value Ref Range   HCV Ab NEGATIVE NEGATIVE   Lab Results  Component Value Date   TSH 4.17 01/15/2015      Assessment & Plan:   Problem List Items Addressed This Visit    Dyspnea   Relevant Orders   Brain natriuretic peptide   Diabetes type 2, uncontrolled ([facility]) - Primary    Marked improvement on actos 76m - will update A1c today.  However, unfortunately also with marked weight gain. Check BNP today. Will also update UA to r/o hematuria. Continue other regimen (off [place].       Relevant Medications   pioglitazone (ACTOS) 30 MG tablet   Other Relevant Orders   Hemoglobin AZ6O  Basic metabolic panel   POCT Urinalysis Dipstick (Automated)   Hypothyroidism    Check TSH  Today. On levothyroxine.      Relevant Orders   TSH   Hypertension    Chronic, stable. Continue current regimen.      HLD (hyperlipidemia)    Chronic, stable. Continue current regimen.          Follow up plan: Return in about 3 months (around 02/28/2016), or as needed, for medicare wellness visit.  JRia Bush MD

## 2015-11-29 NOTE — Patient Instructions (Signed)
Watch weight - if continues to increase let me know  Continue actos 30mg  for now. labwork and urinalysis today.

## 2015-11-29 NOTE — Assessment & Plan Note (Signed)
Check TSH Today. On levothyroxine.

## 2015-12-22 ENCOUNTER — Other Ambulatory Visit: Payer: Self-pay | Admitting: Family Medicine

## 2016-02-14 ENCOUNTER — Other Ambulatory Visit: Payer: Self-pay | Admitting: Family Medicine

## 2016-02-21 ENCOUNTER — Other Ambulatory Visit: Payer: Self-pay

## 2016-02-29 ENCOUNTER — Encounter: Payer: Self-pay | Admitting: Family Medicine

## 2016-03-28 ENCOUNTER — Encounter: Payer: Self-pay | Admitting: Family Medicine

## 2016-03-29 ENCOUNTER — Other Ambulatory Visit: Payer: Self-pay | Admitting: Family Medicine

## 2016-03-29 DIAGNOSIS — E039 Hypothyroidism, unspecified: Secondary | ICD-10-CM

## 2016-03-29 DIAGNOSIS — E785 Hyperlipidemia, unspecified: Secondary | ICD-10-CM

## 2016-03-29 DIAGNOSIS — E118 Type 2 diabetes mellitus with unspecified complications: Principal | ICD-10-CM

## 2016-03-29 DIAGNOSIS — I10 Essential (primary) hypertension: Secondary | ICD-10-CM

## 2016-03-29 DIAGNOSIS — E1165 Type 2 diabetes mellitus with hyperglycemia: Secondary | ICD-10-CM

## 2016-03-29 DIAGNOSIS — F3178 Bipolar disorder, in full remission, most recent episode mixed: Secondary | ICD-10-CM

## 2016-03-29 DIAGNOSIS — IMO0002 Reserved for concepts with insufficient information to code with codable children: Secondary | ICD-10-CM

## 2016-03-29 DIAGNOSIS — Z794 Long term (current) use of insulin: Principal | ICD-10-CM

## 2016-03-30 ENCOUNTER — Other Ambulatory Visit (INDEPENDENT_AMBULATORY_CARE_PROVIDER_SITE_OTHER): Payer: Commercial Managed Care - HMO

## 2016-03-30 DIAGNOSIS — E1165 Type 2 diabetes mellitus with hyperglycemia: Secondary | ICD-10-CM | POA: Diagnosis not present

## 2016-03-30 DIAGNOSIS — F3178 Bipolar disorder, in full remission, most recent episode mixed: Secondary | ICD-10-CM | POA: Diagnosis not present

## 2016-03-30 DIAGNOSIS — Z794 Long term (current) use of insulin: Secondary | ICD-10-CM | POA: Diagnosis not present

## 2016-03-30 DIAGNOSIS — IMO0002 Reserved for concepts with insufficient information to code with codable children: Secondary | ICD-10-CM

## 2016-03-30 DIAGNOSIS — E039 Hypothyroidism, unspecified: Secondary | ICD-10-CM | POA: Diagnosis not present

## 2016-03-30 DIAGNOSIS — E785 Hyperlipidemia, unspecified: Secondary | ICD-10-CM | POA: Diagnosis not present

## 2016-03-30 DIAGNOSIS — E118 Type 2 diabetes mellitus with unspecified complications: Secondary | ICD-10-CM | POA: Diagnosis not present

## 2016-03-30 DIAGNOSIS — I10 Essential (primary) hypertension: Secondary | ICD-10-CM

## 2016-03-30 LAB — CBC WITH DIFFERENTIAL/PLATELET
Basophils Absolute: 0.1 10*3/uL (ref 0.0–0.1)
Basophils Relative: 0.5 % (ref 0.0–3.0)
EOS ABS: 0 10*3/uL (ref 0.0–0.7)
Eosinophils Relative: 0 % (ref 0.0–5.0)
HEMATOCRIT: 36.5 % (ref 36.0–46.0)
Hemoglobin: 12.1 g/dL (ref 12.0–15.0)
LYMPHS ABS: 3.1 10*3/uL (ref 0.7–4.0)
LYMPHS PCT: 19.8 % (ref 12.0–46.0)
MCHC: 33.3 g/dL (ref 30.0–36.0)
MCV: 88.1 fl (ref 78.0–100.0)
MONOS PCT: 5.6 % (ref 3.0–12.0)
Monocytes Absolute: 0.9 10*3/uL (ref 0.1–1.0)
NEUTROS ABS: 11.6 10*3/uL — AB (ref 1.4–7.7)
NEUTROS PCT: 74.1 % (ref 43.0–77.0)
PLATELETS: 386 10*3/uL (ref 150.0–400.0)
RBC: 4.15 Mil/uL (ref 3.87–5.11)
RDW: 15.7 % — AB (ref 11.5–15.5)
WBC: 15.6 10*3/uL — ABNORMAL HIGH (ref 4.0–10.5)

## 2016-03-30 LAB — HEPATIC FUNCTION PANEL
ALT: 16 U/L (ref 0–35)
AST: 13 U/L (ref 0–37)
Albumin: 4 g/dL (ref 3.5–5.2)
Alkaline Phosphatase: 53 U/L (ref 39–117)
BILIRUBIN TOTAL: 0.3 mg/dL (ref 0.2–1.2)
Bilirubin, Direct: 0.1 mg/dL (ref 0.0–0.3)
Total Protein: 7.7 g/dL (ref 6.0–8.3)

## 2016-03-30 LAB — BASIC METABOLIC PANEL
BUN: 15 mg/dL (ref 6–23)
CALCIUM: 9.3 mg/dL (ref 8.4–10.5)
CO2: 30 mEq/L (ref 19–32)
CREATININE: 0.86 mg/dL (ref 0.40–1.20)
Chloride: 101 mEq/L (ref 96–112)
GFR: 70.71 mL/min (ref >60.00)
Glucose, Bld: 117 mg/dL — ABNORMAL HIGH (ref 70–99)
Potassium: 4.3 mEq/L (ref 3.5–5.1)
Sodium: 139 mEq/L (ref 135–145)

## 2016-03-30 LAB — LIPID PANEL
CHOL/HDL RATIO: 4
Cholesterol: 134 mg/dL (ref 0–200)
HDL: 37.7 mg/dL — AB (ref >39.00)
NonHDL: 96.17
Triglycerides: 224 mg/dL — ABNORMAL HIGH (ref 0.0–149.0)
VLDL: 44.8 mg/dL — AB (ref 0.0–40.0)

## 2016-03-30 LAB — LDL CHOLESTEROL, DIRECT: LDL DIRECT: 69 mg/dL

## 2016-03-30 LAB — T4, FREE: Free T4: 0.79 ng/dL (ref 0.60–1.60)

## 2016-03-30 LAB — TSH: TSH: 6.48 u[IU]/mL — ABNORMAL HIGH (ref 0.35–4.50)

## 2016-03-30 LAB — HEMOGLOBIN A1C: HEMOGLOBIN A1C: 6.2 % (ref 4.6–6.5)

## 2016-04-03 ENCOUNTER — Encounter: Payer: Self-pay | Admitting: Family Medicine

## 2016-04-03 NOTE — Telephone Encounter (Signed)
Pt currently on lantus. I've asked her to price out toujeo, levemir and novolog 70/30.  Jessica Mercury do you know of any other programs for pt to be better able to afford insulin now she's in [employer] [place]?  thanks

## 2016-04-05 NOTE — Telephone Encounter (Signed)
She should be able to use discount cards since she is paying out of pocket now.

## 2016-04-07 ENCOUNTER — Ambulatory Visit (INDEPENDENT_AMBULATORY_CARE_PROVIDER_SITE_OTHER): Payer: Commercial Managed Care - HMO | Admitting: Family Medicine

## 2016-04-07 ENCOUNTER — Encounter: Payer: Self-pay | Admitting: Family Medicine

## 2016-04-07 VITALS — BP 128/62 | HR 88 | Temp 97.8°F | Ht 64.0 in | Wt 305.5 lb

## 2016-04-07 DIAGNOSIS — E785 Hyperlipidemia, unspecified: Secondary | ICD-10-CM

## 2016-04-07 DIAGNOSIS — Z0000 Encounter for general adult medical examination without abnormal findings: Secondary | ICD-10-CM | POA: Diagnosis not present

## 2016-04-07 DIAGNOSIS — Z7189 Other specified counseling: Secondary | ICD-10-CM

## 2016-04-07 DIAGNOSIS — D72829 Elevated white blood cell count, unspecified: Secondary | ICD-10-CM

## 2016-04-07 DIAGNOSIS — E118 Type 2 diabetes mellitus with unspecified complications: Secondary | ICD-10-CM

## 2016-04-07 DIAGNOSIS — E039 Hypothyroidism, unspecified: Secondary | ICD-10-CM

## 2016-04-07 DIAGNOSIS — R0600 Dyspnea, unspecified: Secondary | ICD-10-CM

## 2016-04-07 DIAGNOSIS — F3178 Bipolar disorder, in full remission, most recent episode mixed: Secondary | ICD-10-CM

## 2016-04-07 DIAGNOSIS — Z794 Long term (current) use of insulin: Secondary | ICD-10-CM

## 2016-04-07 MED ORDER — GLIMEPIRIDE 4 MG PO TABS: ORAL_TABLET | ORAL | 3 refills | Status: DC

## 2016-04-07 MED ORDER — BASAGLAR KWIKPEN 100 UNIT/ML ~~LOC~~ SOPN: 55.0000 [IU] | PEN_INJECTOR | Freq: Every day | SUBCUTANEOUS | 6 refills | Status: DC

## 2016-04-07 NOTE — Assessment & Plan Note (Signed)
Persistent undertreated - will increase levothyroxine to 1 tablet daily with 1.5 tablets twice weekly. Did not increase to 159mcg because she has large supply at home currently.

## 2016-04-07 NOTE — Progress Notes (Signed)
BP 128/62   Pulse 88   Temp 97.8 F (36.6 C) (Oral)   Ht _0  (1.626 m)   Wt (!) 305 lb 8 oz (138.6 kg)   BMI 52.44 kg/m    CC: medicare wellness visit Subjective:    Patient ID: [name], female    DOB: [birth year], 65 y.o.   MRN: [id number]  HPI: [name] is a 65 y.o. female presenting on 04/07/2016 for Annual Exam   30+ lb weight gain over the past year. Attributes to inactivity from knee pain and restarting actos 11/2015. Achieving wonderful control with actos. Some low sugars. Bought glucose tablets.   Hearing screen passed.  Vision screen with eye doctor.  No falls.  Denies depression. H/o bipolar on geodon. Mood stable on current regimen. Has not returned to psychiatrist.   Preventative: COLONOSCOPY Date: 12/2011 hyperplastic polyp x2,. diverticulosis, rec rpt 10 yrs Mammogram WNL 05/2015.Does breast exams at home.  Pap 2015 WNL.rpt Q3 years. S/p total hysterectomy with bilat oophorectomy but cervix remains (fibroids). Bladder sling placed at that time.  Flu yearly Pneumovax 2012 Td 2012 Shingles shot - 2015 Advanced directives: does not have set in place. Packet provided. Husband would be HCPOA.  Seat belt use discussed Sunscreen use discussed. No changing moles.  Quit smoking 2011 No alcohol use.  Caffeine: 2 coffee/am Lives with husband, 1 dog, 2 cats Occupation: disability since 2009 for bipolar, previously worked at [employer] Activity: sedentary Diet: not many fruits, good vegetables, good amt water  Relevant past medical, surgical, family and social history reviewed and updated as indicated. Interim medical history since our last visit reviewed. Allergies and medications reviewed and updated. Current Outpatient Prescriptions on File Prior to Visit  Medication Sig  . ACCU-CHEK FASTCLIX LANCETS MISC Use to check sugar twice daily Dx: E11.65  . ACCU-CHEK SMARTVIEW test strip USE AS INSTRUCTED TO CHECK SUGAR TWICE DAILY.  Marland Kitchen aspirin EC 81 MG  tablet Take 162 mg by mouth daily.  . Blood Glucose Monitoring Suppl (ACCU-CHEK NANO SMARTVIEW) W/DEVICE KIT Use to check sugar twice daily Dx: E11.65  . famotidine (PEPCID) 20 MG tablet Take 20 mg by mouth daily.  . hydrochlorothiazide (HYDRODIURIL) 25 MG tablet TAKE 1 TABLET EVERY DAY  . Insulin Glargine (LANTUS) 100 UNIT/ML Solostar Pen Inject 55 Units into the skin daily.  Marland Kitchen lisinopril (PRINIVIL,ZESTRIL) 5 MG tablet TAKE 1 TABLET EVERY DAY  . metFORMIN (GLUCOPHAGE) 500 MG tablet TAKE 2 TABLETS IN THE MORNING AND 2 TABLETS AT NIGHT  . metoprolol tartrate (LOPRESSOR) 25 MG tablet TAKE 1/2 TABLET TWICE DAILY  . Multiple Vitamin (MULTIVITAMIN) tablet Take 1 tablet by mouth daily.  . Naproxen Sodium 220 MG CAPS Take 440 mg by mouth 2 (two) times daily.  Marland Kitchen NOVOFINE 30G X 8 MM MISC USE ONE TIME DAILY  WITH  LANTUS  PEN  . nystatin cream (MYCOSTATIN) APPLY TOPICALLY TWICE DAILY (Patient taking differently: APPLY TOPICALLY TWICE DAILY AS NEEDED)  . pioglitazone (ACTOS) 30 MG tablet Take 1 tablet (30 mg total) by mouth daily.  . simvastatin (ZOCOR) 20 MG tablet Take 1 tablet (20 mg total) by mouth at bedtime.  . ziprasidone (GEODON) 40 MG capsule TAKE 1 CAPSULE TWICE DAILY WITH MEALS   No current facility-administered medications on file prior to visit.     Review of Systems  Constitutional: Negative for activity change, appetite change, chills, fatigue, fever and unexpected weight change.  HENT: Negative for hearing loss.   Eyes: Negative for  visual disturbance.  Respiratory: Positive for shortness of breath (exertional). Negative for cough, chest tightness and wheezing.   Cardiovascular: Negative for chest pain, palpitations and leg swelling.  Gastrointestinal: Positive for nausea. Negative for abdominal distention, abdominal pain, blood in stool, constipation, diarrhea and vomiting.  Genitourinary: Negative for difficulty urinating and hematuria.  Musculoskeletal: Negative for arthralgias,  myalgias and neck pain.  Skin: Negative for rash.  Neurological: Negative for dizziness, seizures, syncope and headaches.  Hematological: Negative for adenopathy. Does not bruise/bleed easily.  Psychiatric/Behavioral: Negative for dysphoric mood. The patient is not nervous/anxious.    Per HPI unless specifically indicated in ROS section     Objective:    BP 128/62   Pulse 88   Temp 97.8 F (36.6 C) (Oral)   Ht _0  (1.626 m)   Wt (!) 305 lb 8 oz (138.6 kg)   BMI 52.44 kg/m   Wt Readings from Last 3 Encounters:  04/07/16 (!) 305 lb 8 oz (138.6 kg)  11/29/15 297 lb 4 oz (134.8 kg)  07/30/15 275 lb (124.7 kg)    Physical Exam  Constitutional: She is oriented to person, place, and time. She appears well-developed and well-nourished. No distress.  HENT:  Head: Normocephalic and atraumatic.  Right Ear: Hearing, tympanic membrane, external ear and ear canal normal.  Left Ear: Hearing, tympanic membrane, external ear and ear canal normal.  Nose: Nose normal.  Mouth/Throat: Uvula is midline, oropharynx is clear and moist and mucous membranes are normal. No oropharyngeal exudate, posterior oropharyngeal edema or posterior oropharyngeal erythema.  Eyes: Conjunctivae and EOM are normal. Pupils are equal, round, and reactive to light. No scleral icterus.  Neck: Normal range of motion. Neck supple.  Cardiovascular: Normal rate, regular rhythm, normal heart sounds and intact distal pulses.   No murmur heard. Pulses:      Radial pulses are 2+ on the right side, and 2+ on the left side.  Pulmonary/Chest: Effort normal and breath sounds normal. No respiratory distress. She has no wheezes. She has no rales.  Abdominal: Soft. Bowel sounds are normal. She exhibits no distension and no mass. There is no tenderness. There is no rebound and no guarding.  Musculoskeletal: Normal range of motion. She exhibits edema (tr).  Lymphadenopathy:    She has no cervical adenopathy.  Neurological: She is  alert and oriented to person, place, and time.  CN grossly intact, station and gait intact Recall 3/3  Calculation 5/5 serial 3s  Skin: Skin is warm and dry. No rash noted.  Psychiatric: She has a normal mood and affect. Her behavior is normal. Judgment and thought content normal.  Nursing note and vitals reviewed.  Results for orders placed or performed in visit on 03/30/16  Lipid panel  Result Value Ref Range   Cholesterol 134 0 - 200 mg/dL   Triglycerides 224.0 (H) 0.0 - 149.0 mg/dL   HDL 37.70 (L) >39.00 mg/dL   VLDL 44.8 (H) 0.0 - 40.0 mg/dL   Total CHOL/HDL Ratio 4    NonHDL 96.17   Hemoglobin A1c  Result Value Ref Range   Hgb A1c MFr Bld 6.2 4.6 - 6.5 %  TSH  Result Value Ref Range   TSH 6.48 (H) 0.35 - 4.50 uIU/mL  T4, free  Result Value Ref Range   Free T4 0.79 0.60 - 1.60 ng/dL  CBC with Differential/Platelet  Result Value Ref Range   WBC 15.6 (H) 4.0 - 10.5 K/uL   RBC 4.15 3.87 - 5.11 Mil/uL  Hemoglobin 12.1 12.0 - 15.0 g/dL   HCT 36.5 36.0 - 46.0 %   MCV 88.1 78.0 - 100.0 fl   MCHC 33.3 30.0 - 36.0 g/dL   RDW 15.7 (H) 11.5 - 15.5 %   Platelets 386.0 150.0 - 400.0 K/uL   Neutrophils Relative % 74.1 43.0 - 77.0 %   Lymphocytes Relative 19.8 12.0 - 46.0 %   Monocytes Relative 5.6 3.0 - 12.0 %   Eosinophils Relative 0.0 0.0 - 5.0 %   Basophils Relative 0.5 0.0 - 3.0 %   Neutro Abs 11.6 (H) 1.4 - 7.7 K/uL   Lymphs Abs 3.1 0.7 - 4.0 K/uL   Monocytes Absolute 0.9 0.1 - 1.0 K/uL   Eosinophils Absolute 0.0 0.0 - 0.7 K/uL   Basophils Absolute 0.1 0.0 - 0.1 K/uL  Basic metabolic panel  Result Value Ref Range   Sodium 139 135 - 145 mEq/L   Potassium 4.3 3.5 - 5.1 mEq/L   Chloride 101 96 - 112 mEq/L   CO2 30 19 - 32 mEq/L   Glucose, Bld 117 (H) 70 - 99 mg/dL   BUN 15 6 - 23 mg/dL   Creatinine, Ser 0.86 0.40 - 1.20 mg/dL   Calcium 9.3 8.4 - 10.5 mg/dL   GFR 70.71 >60.00 mL/min  Hepatic Function Panel  Result Value Ref Range   Total Bilirubin 0.3 0.2 - 1.2  mg/dL   Bilirubin, Direct 0.1 0.0 - 0.3 mg/dL   Alkaline Phosphatase 53 39 - 117 U/L   AST 13 0 - 37 U/L   ALT 16 0 - 35 U/L   Total Protein 7.7 6.0 - 8.3 g/dL   Albumin 4.0 3.5 - 5.2 g/dL  LDL cholesterol, direct  Result Value Ref Range   Direct LDL 69.0 mg/dL      Assessment & Plan:   Problem List Items Addressed This Visit    Advanced care planning/counseling discussion    Advanced directives: does not have set in place. Packet provided today. Husband would be HCPOA.       Bipolar disorder ([place])    Feels stable on current regimen. Has not established with psych yet - difficulty finding one that will take her insurance.       Diabetes mellitus type 2, controlled, with complications (HCC)    Chronic. Marked improvement with actos. However weight gain persists. Some lows endorsed - will decrease amaryl to 10m once daily.  In donut hole - unable to afford lantus at this time. Paying out of pocket. Coupon for $5/month basaglar provided today with printed Rx for basaglar to price out. lantus coupon also provided to use if able.  RTC 3 mo DM f/u visit.       Relevant Medications   glimepiride (AMARYL) 4 MG tablet   Insulin Glargine (BASAGLAR KWIKPEN) 100 UNIT/ML SOPN   Dyspnea    Deteriorated with weight gain and actos.      Health maintenance examination    Preventative protocols reviewed and updated unless pt declined. Discussed healthy diet and lifestyle.       HLD (hyperlipidemia)    Chronic, continue simvastatin. Trig elevated, LDL at goal.       Hypothyroidism    Persistent undertreated - will increase levothyroxine to 1 tablet daily with 1.5 tablets twice weekly. Did not increase to 1138m because she has large supply at home currently.       Relevant Medications   levothyroxine (SYNTHROID, LEVOTHROID) 100 MCG tablet   Leukocytosis  S/p unrevealing evaluation 04/2015 (SPEP, periph smear). Continue to monitor, consider updated SPEP next visit.       Medicare  annual wellness visit, subsequent - Primary    I have personally reviewed the Medicare Annual Wellness questionnaire and have noted 1. The patient's medical and social history 2. Their use of alcohol, tobacco or illicit drugs 3. Their current medications and supplements 4. The patient's functional ability including ADL's, fall risks, home safety risks and hearing or visual impairment. Cognitive function has been assessed and addressed as indicated.  5. Diet and physical activity 6. Evidence for depression or mood disorders The patients weight, height, BMI have been recorded in the chart. I have made referrals, counseling and provided education to the patient based on review of the above and I have provided the pt with a written personalized care plan for preventive services. Provider list updated.. See scanned questionairre as needed for further documentation. Reviewed preventative protocols and updated unless pt declined.        Other Visit Diagnoses   None.      Follow up plan: Return in about 3 months (around 07/08/2016), or as needed, for follow up visit.  Ria Bush, MD

## 2016-04-07 NOTE — Assessment & Plan Note (Signed)
Chronic, continue simvastatin. Trig elevated, LDL at goal.

## 2016-04-07 NOTE — Assessment & Plan Note (Addendum)
Feels stable on current regimen. Has not established with psych yet - difficulty finding one that will take her insurance.

## 2016-04-07 NOTE — Progress Notes (Signed)
Pre visit review using our clinic review tool, if applicable. No additional management support is needed unless otherwise documented below in the visit note. 

## 2016-04-07 NOTE — Assessment & Plan Note (Signed)

## 2016-04-07 NOTE — Telephone Encounter (Signed)
basaglar $5/mo coupon provided at [facility] today to price out.

## 2016-04-07 NOTE — Assessment & Plan Note (Signed)
Advanced directives: does not have set in place. Packet provided today. Husband would be HCPOA.

## 2016-04-07 NOTE — Patient Instructions (Addendum)
We will check coupons for long acting insulin.  Advanced directive packet provided today - work on filling this out.  Work on seated chair exercises for wieght loss. Decrease amaryl (glimepiride) to '4mg'$  once daily with breakfast. New dose sent to pharmacy.  Increase levothyroxine - 2 days a week take extra 1/2 tablet daily (Monday and Friday).   Health Maintenance, Female Adopting a healthy lifestyle and getting preventive care can go a long way to promote health and wellness. Talk with your health care provider about what schedule of regular examinations is right for you. This is a good chance for you to check in with your provider about disease prevention and staying healthy. In between checkups, there are plenty of things you can do on your own. Experts have done a lot of research about which lifestyle changes and preventive measures are most likely to keep you healthy. Ask your health care provider for more information. WEIGHT AND DIET  Eat a healthy diet  Be sure to include plenty of vegetables, fruits, low-fat dairy products, and lean protein.  Do not eat a lot of foods high in solid fats, added sugars, or salt.  Get regular exercise. This is one of the most important things you can do for your health.  Most adults should exercise for at least 150 minutes each week. The exercise should increase your heart rate and make you sweat (moderate-intensity exercise).  Most adults should also do strengthening exercises at least twice a week. This is in addition to the moderate-intensity exercise.  Maintain a healthy weight  Body mass index (BMI) is a measurement that can be used to identify possible weight problems. It estimates body fat based on height and weight. Your health care provider can help determine your BMI and help you achieve or maintain a healthy weight.  For females 63 years of age and older:   A BMI below 18.5 is considered underweight.  A BMI of 18.5 to 24.9 is  normal.  A BMI of 25 to 29.9 is considered overweight.  A BMI of 30 and above is considered obese.  Watch levels of cholesterol and blood lipids  You should start having your blood tested for lipids and cholesterol at 63 years of age, then have this test every 5 years.  You may need to have your cholesterol levels checked more often if:  Your lipid or cholesterol levels are high.  You are older than 63 years of age.  You are at high risk for heart disease.  CANCER SCREENING   Lung Cancer  Lung cancer screening is recommended for adults 5-48 years old who are at high risk for lung cancer because of a history of smoking.  A yearly low-dose CT scan of the lungs is recommended for people who:  Currently smoke.  Have quit within the past 15 years.  Have at least a 30-pack-year history of smoking. A pack year is smoking an average of one pack of cigarettes a day for 1 year.  Yearly screening should continue until it has been 15 years since you quit.  Yearly screening should stop if you develop a health problem that would prevent you from having lung cancer treatment.  Breast Cancer  Practice breast self-awareness. This means understanding how your breasts normally appear and feel.  It also means doing regular breast self-exams. Let your health care provider know about any changes, no matter how small.  If you are in your 20s or 30s, you should have a clinical  breast exam (CBE) by a health care provider every 1-3 years as part of a regular health exam.  If you are 9 or older, have a CBE every year. Also consider having a breast X-ray (mammogram) every year.  If you have a family history of breast cancer, talk to your health care provider about genetic screening.  If you are at high risk for breast cancer, talk to your health care provider about having an MRI and a mammogram every year.  Breast cancer gene (BRCA) assessment is recommended for women who have family members  with BRCA-related cancers. BRCA-related cancers include:  Breast.  Ovarian.  Tubal.  Peritoneal cancers.  Results of the assessment will determine the need for genetic counseling and BRCA1 and BRCA2 testing. Cervical Cancer Your health care provider may recommend that you be screened regularly for cancer of the pelvic organs (ovaries, uterus, and vagina). This screening involves a pelvic examination, including checking for microscopic changes to the surface of your cervix (Pap test). You may be encouraged to have this screening done every 3 years, beginning at age 43.  For women ages 26-65, health care providers may recommend pelvic exams and Pap testing every 3 years, or they may recommend the Pap and pelvic exam, combined with testing for human papilloma virus (HPV), every 5 years. Some types of HPV increase your risk of cervical cancer. Testing for HPV may also be done on women of any age with unclear Pap test results.  Other health care providers may not recommend any screening for nonpregnant women who are considered low risk for pelvic cancer and who do not have symptoms. Ask your health care provider if a screening pelvic exam is right for you.  If you have had past treatment for cervical cancer or a condition that could lead to cancer, you need Pap tests and screening for cancer for at least 20 years after your treatment. If Pap tests have been discontinued, your risk factors (such as having a new sexual partner) need to be reassessed to determine if screening should resume. Some women have medical problems that increase the chance of getting cervical cancer. In these cases, your health care provider may recommend more frequent screening and Pap tests. Colorectal Cancer  This type of cancer can be detected and often prevented.  Routine colorectal cancer screening usually begins at 63 years of age and continues through 63 years of age.  Your health care provider may recommend  screening at an earlier age if you have risk factors for colon cancer.  Your health care provider may also recommend using home test kits to check for hidden blood in the stool.  A small camera at the end of a tube can be used to examine your colon directly (sigmoidoscopy or colonoscopy). This is done to check for the earliest forms of colorectal cancer.  Routine screening usually begins at age 54.  Direct examination of the colon should be repeated every 5-10 years through 63 years of age. However, you may need to be screened more often if early forms of precancerous polyps or small growths are found. Skin Cancer  Check your skin from head to toe regularly.  Tell your health care provider about any new moles or changes in moles, especially if there is a change in a mole's shape or color.  Also tell your health care provider if you have a mole that is larger than the size of a pencil eraser.  Always use sunscreen. Apply sunscreen liberally and  repeatedly throughout the day.  Protect yourself by wearing long sleeves, pants, a wide-brimmed hat, and sunglasses whenever you are outside. HEART DISEASE, DIABETES, AND HIGH BLOOD PRESSURE   High blood pressure causes heart disease and increases the risk of stroke. High blood pressure is more likely to develop in:  People who have blood pressure in the high end of the normal range (130-139/85-89 mm Hg).  People who are overweight or obese.  People who are African American.  If you are 6-75 years of age, have your blood pressure checked every 3-5 years. If you are 78 years of age or older, have your blood pressure checked every year. You should have your blood pressure measured twice--once when you are at a hospital or clinic, and once when you are not at a hospital or clinic. Record the average of the two measurements. To check your blood pressure when you are not at a hospital or clinic, you can use:  An automated blood pressure machine at a  pharmacy.  A home blood pressure monitor.  If you are between 33 years and 77 years old, ask your health care provider if you should take aspirin to prevent strokes.  Have regular diabetes screenings. This involves taking a blood sample to check your fasting blood sugar level.  If you are at a normal weight and have a low risk for diabetes, have this test once every three years after 63 years of age.  If you are overweight and have a high risk for diabetes, consider being tested at a younger age or more often. PREVENTING INFECTION  Hepatitis B  If you have a higher risk for hepatitis B, you should be screened for this virus. You are considered at high risk for hepatitis B if:  You were born in a country where hepatitis B is common. Ask your health care provider which countries are considered high risk.  Your parents were born in a high-risk country, and you have not been immunized against hepatitis B (hepatitis B vaccine).  You have HIV or AIDS.  You use needles to inject street drugs.  You live with someone who has hepatitis B.  You have had sex with someone who has hepatitis B.  You get hemodialysis treatment.  You take certain medicines for conditions, including cancer, organ transplantation, and autoimmune conditions. Hepatitis C  Blood testing is recommended for:  Everyone born from [birth year] through 1963.  Anyone with known risk factors for hepatitis C. Sexually transmitted infections (STIs)  You should be screened for sexually transmitted infections (STIs) including gonorrhea and chlamydia if:  You are sexually active and are younger than 63 years of age.  You are older than 63 years of age and your health care provider tells you that you are at risk for this type of infection.  Your sexual activity has changed since you were last screened and you are at an increased risk for chlamydia or gonorrhea. Ask your health care provider if you are at risk.  If you do not  have HIV, but are at risk, it may be recommended that you take a prescription medicine daily to prevent HIV infection. This is called pre-exposure prophylaxis (PrEP). You are considered at risk if:  You are sexually active and do not regularly use condoms or know the HIV status of your partner(s).  You take drugs by injection.  You are sexually active with a partner who has HIV. Talk with your health care provider about whether you are at  high risk of being infected with HIV. If you choose to begin PrEP, you should first be tested for HIV. You should then be tested every 3 months for as long as you are taking PrEP.  PREGNANCY   If you are premenopausal and you may become pregnant, ask your health care provider about preconception counseling.  If you may become pregnant, take 400 to 800 micrograms (mcg) of folic acid every day.  If you want to prevent pregnancy, talk to your health care provider about birth control (contraception). OSTEOPOROSIS AND MENOPAUSE   Osteoporosis is a disease in which the bones lose minerals and strength with aging. This can result in serious bone fractures. Your risk for osteoporosis can be identified using a bone density scan.  If you are 63 years of age or older, or if you are at risk for osteoporosis and fractures, ask your health care provider if you should be screened.  Ask your health care provider whether you should take a calcium or vitamin D supplement to lower your risk for osteoporosis.  Menopause may have certain physical symptoms and risks.  Hormone replacement therapy may reduce some of these symptoms and risks. Talk to your health care provider about whether hormone replacement therapy is right for you.  HOME CARE INSTRUCTIONS   Schedule regular health, dental, and eye exams.  Stay current with your immunizations.   Do not use any tobacco products including cigarettes, chewing tobacco, or electronic cigarettes.  If you are pregnant, do not  drink alcohol.  If you are breastfeeding, limit how much and how often you drink alcohol.  Limit alcohol intake to no more than 1 drink per day for nonpregnant women. One drink equals 12 ounces of beer, 5 ounces of wine, or 1 ounces of hard liquor.  Do not use street drugs.  Do not share needles.  Ask your health care provider for help if you need support or information about quitting drugs.  Tell your health care provider if you often feel depressed.  Tell your health care provider if you have ever been abused or do not feel safe at home.   This information is not intended to replace advice given to you by your health care provider. Make sure you discuss any questions you have with your health care provider.   Document Released: 02/20/2011 Document Revised: 08/28/2014 Document Reviewed: 07/09/2013 Elsevier Interactive Patient Education [employer].

## 2016-04-07 NOTE — Assessment & Plan Note (Signed)
S/p unrevealing evaluation 04/2015 (SPEP, periph smear). Continue to monitor, consider updated SPEP next visit.

## 2016-04-07 NOTE — Assessment & Plan Note (Signed)
Chronic. Marked improvement with actos. However weight gain persists. Some lows endorsed - will decrease amaryl to 4mg  once daily.  In donut hole - unable to afford lantus at this time. Paying out of pocket. Coupon for $5/month basaglar provided today with printed Rx for basaglar to price out. lantus coupon also provided to use if able.  RTC 3 mo DM f/u visit.

## 2016-04-07 NOTE — Assessment & Plan Note (Signed)
Preventative protocols reviewed and updated unless pt declined. Discussed healthy diet and lifestyle.  

## 2016-04-07 NOTE — Assessment & Plan Note (Signed)
Deteriorated with weight gain and actos.

## 2016-04-12 ENCOUNTER — Other Ambulatory Visit: Payer: Self-pay | Admitting: Family Medicine

## 2016-04-16 ENCOUNTER — Other Ambulatory Visit: Payer: Self-pay | Admitting: Family Medicine

## 2016-04-16 ENCOUNTER — Encounter: Payer: Self-pay | Admitting: Family Medicine

## 2016-04-18 NOTE — Telephone Encounter (Signed)
Pt states she cannot use basaglar coupon with medicare. Can she use if she pays out of pocket?

## 2016-04-18 NOTE — Telephone Encounter (Signed)
No coupons can be used with Medicare for any patient. This particular coupon requires her to have commercial insurance in order to use it. So, it won't assist her to pay out of pocket. It only helps if she has commercial coverage.

## 2016-04-20 MED ORDER — INSULIN NPH (HUMAN) (ISOPHANE) 100 UNIT/ML ~~LOC~~ SUSP: 55.0000 [IU] | Freq: Every day | SUBCUTANEOUS | 11 refills | Status: DC

## 2016-04-22 ENCOUNTER — Encounter: Payer: Self-pay | Admitting: Family Medicine

## 2016-04-25 MED ORDER — "INSULIN SYRINGE/NEEDLE 28G X 1/2"" 1 ML MISC": 3 refills | Status: DC

## 2016-05-06 ENCOUNTER — Encounter: Payer: Self-pay | Admitting: Family Medicine

## 2016-05-08 MED ORDER — SIMVASTATIN 20 MG PO TABS: 20.0000 mg | ORAL_TABLET | Freq: Every day | ORAL | 3 refills | Status: DC

## 2016-05-09 ENCOUNTER — Other Ambulatory Visit: Payer: Self-pay | Admitting: Family Medicine

## 2016-05-09 DIAGNOSIS — Z1231 Encounter for screening mammogram for malignant neoplasm of breast: Secondary | ICD-10-CM

## 2016-05-16 ENCOUNTER — Other Ambulatory Visit: Payer: Self-pay | Admitting: Family Medicine

## 2016-05-24 ENCOUNTER — Ambulatory Visit: Payer: Self-pay

## 2016-05-24 ENCOUNTER — Telehealth: Payer: Self-pay | Admitting: *Deleted

## 2016-05-24 NOTE — Telephone Encounter (Signed)
-----   Message from Ria Bush, MD sent at 05/24/2016  7:14 AM EDT ----- Regarding: RE: ? about which pna inj  You are right. Let's just do flu shot. Not due for pneumovax until 43.  javier  ----- Message ----- From: Marchia Bond Sent: 05/23/2016   2:40 PM To: Ria Bush, MD Subject: ? about which pna inj                          Pt has nurse visit tomorrow for flu and pna inj. I just wanted to verify that she needs PPSV instead of PCV. She had 1 PPSV in 2012, and she's not 65.  Thanks Aniceto Boss

## 2016-05-29 ENCOUNTER — Ambulatory Visit
Admission: RE | Admit: 2016-05-29 | Discharge: 2016-05-29 | Disposition: A | Discharge status: Home / Self Care | Payer: Commercial Managed Care - HMO | Source: Ambulatory Visit | Attending: Family Medicine | Admitting: Family Medicine

## 2016-05-29 DIAGNOSIS — Z1231 Encounter for screening mammogram for malignant neoplasm of breast: Secondary | ICD-10-CM

## 2016-05-31 ENCOUNTER — Ambulatory Visit (INDEPENDENT_AMBULATORY_CARE_PROVIDER_SITE_OTHER): Payer: Commercial Managed Care - HMO | Admitting: *Deleted

## 2016-05-31 DIAGNOSIS — Z23 Encounter for immunization: Secondary | ICD-10-CM

## 2016-05-31 LAB — HM MAMMOGRAPHY

## 2016-06-01 ENCOUNTER — Encounter: Payer: Self-pay | Admitting: *Deleted

## 2016-06-07 ENCOUNTER — Other Ambulatory Visit: Payer: Self-pay | Admitting: Family Medicine

## 2016-06-08 ENCOUNTER — Encounter: Payer: Self-pay | Admitting: Family Medicine

## 2016-06-20 ENCOUNTER — Other Ambulatory Visit: Payer: Self-pay | Admitting: Family Medicine

## 2016-06-20 NOTE — Telephone Encounter (Signed)
Ok to refill? Last filled 09/22/15 #180 1RF

## 2016-07-10 ENCOUNTER — Ambulatory Visit (INDEPENDENT_AMBULATORY_CARE_PROVIDER_SITE_OTHER): Payer: Commercial Managed Care - HMO | Admitting: Family Medicine

## 2016-07-10 ENCOUNTER — Encounter: Payer: Self-pay | Admitting: Family Medicine

## 2016-07-10 VITALS — BP 136/64 | HR 98 | Temp 97.5°F | Wt 309.2 lb

## 2016-07-10 DIAGNOSIS — E039 Hypothyroidism, unspecified: Secondary | ICD-10-CM | POA: Diagnosis not present

## 2016-07-10 DIAGNOSIS — E118 Type 2 diabetes mellitus with unspecified complications: Secondary | ICD-10-CM

## 2016-07-10 DIAGNOSIS — Z794 Long term (current) use of insulin: Secondary | ICD-10-CM | POA: Diagnosis not present

## 2016-07-10 DIAGNOSIS — R221 Localized swelling, mass and lump, neck: Secondary | ICD-10-CM | POA: Diagnosis not present

## 2016-07-10 LAB — TSH: TSH: 3.68 u[IU]/mL (ref 0.35–4.50)

## 2016-07-10 LAB — HEMOGLOBIN A1C: HEMOGLOBIN A1C: 6.1 % (ref 4.6–6.5)

## 2016-07-10 NOTE — Assessment & Plan Note (Signed)
Ongoing great control on NPH and actos. Will decrease amaryl due to intermittent low cbg's pre lunch. Continue metformin and other medications as up to now.

## 2016-07-10 NOTE — Patient Instructions (Addendum)
Sugars are wonderful! Decrease amaryl (glimepiride) to 2mg  daily (1/2 dose of current tablet).  Labs today We will refer you for thyroid ultrasound Return in 3-4 months for follow up

## 2016-07-10 NOTE — Progress Notes (Signed)
Pre visit review using our clinic review tool, if applicable. No additional management support is needed unless otherwise documented below in the visit note. 

## 2016-07-10 NOTE — Assessment & Plan Note (Signed)
Update TSH after recent levothyroxine dosing change.

## 2016-07-10 NOTE — Assessment & Plan Note (Signed)
Per patient present for last several months - will order thyroid US to start, consider neck CT if unrevealing.

## 2016-07-10 NOTE — Progress Notes (Signed)
BP 136/64   Pulse 98   Temp 97.5 F (36.4 C) (Oral)   Wt (!) 309 lb 4 oz (140.3 kg)   SpO2 97%   BMI 53.08 kg/m    CC: 3 mo f/u visit Subjective:    Patient ID: [name], female    DOB: 11/30/1950, 65 y.o.   MRN: [id number]  HPI: BALINDA HEACOCK is a 65 y.o. female presenting on 07/10/2016 for Follow-up   To switch to [facility],The next year.   DM - actos has led to wonderful sugar control however ongoing weight gain noted. Last visit we decreased amaryl to 62m daily. Last visit we changed lantus to basaglar due to affordability. Brings log: fasting 80-110, after dinner 80-140Ongoing low sugars to 60s before lunch.   R neck mass noted over last few months. Not going away. Mildly tender.   Relevant past medical, surgical, family and social history reviewed and updated as indicated. Interim medical history since our last visit reviewed. Allergies and medications reviewed and updated. Current Outpatient Prescriptions on File Prior to Visit  Medication Sig  . ACCU-CHEK FASTCLIX LANCETS MISC Use to check sugar twice daily Dx: E11.65  . ACCU-CHEK SMARTVIEW test strip USE AS INSTRUCTED TO CHECK SUGAR TWICE DAILY.  .Marland Kitchenaspirin EC 81 MG tablet Take 162 mg by mouth daily.  . Blood Glucose Monitoring Suppl (ACCU-CHEK NANO SMARTVIEW) W/DEVICE KIT Use to check sugar twice daily Dx: E11.65  . famotidine (PEPCID) 20 MG tablet Take 20 mg by mouth daily.  . hydrochlorothiazide (HYDRODIURIL) 25 MG tablet TAKE 1 TABLET EVERY DAY  . INS SYRINGE/NEEDLE 1CC/28G 28G X 1/2" 1 ML MISC Use as directed  . insulin NPH Human (NOVOLIN N) 100 UNIT/ML injection Inject 0.55 mLs (55 Units total) into the skin at bedtime.  .Marland Kitchenlevothyroxine (SYNTHROID, LEVOTHROID) 100 MCG tablet Take 1 tablet (100 mcg total) by mouth daily. Two days a week take extra 1/2 tablet daily  . lisinopril (PRINIVIL,ZESTRIL) 5 MG tablet TAKE 1 TABLET EVERY DAY  . metFORMIN (GLUCOPHAGE) 500 MG tablet TAKE 2 TABLETS IN THE MORNING AND  2 TABLETS AT NIGHT  . metoprolol tartrate (LOPRESSOR) 25 MG tablet TAKE 1/2 TABLET TWICE DAILY  . Multiple Vitamin (MULTIVITAMIN) tablet Take 1 tablet by mouth daily.  . Naproxen Sodium 220 MG CAPS Take 440 mg by mouth 2 (two) times daily.  .Marland Kitchennystatin cream (MYCOSTATIN) APPLY TOPICALLY TWICE DAILY  . pioglitazone (ACTOS) 30 MG tablet Take 1 tablet (30 mg total) by mouth daily.  . simvastatin (ZOCOR) 20 MG tablet Take 1 tablet (20 mg total) by mouth at bedtime.  . ziprasidone (GEODON) 40 MG capsule TAKE 1 CAPSULE TWICE DAILY WITH MEALS  . Insulin Glargine (BASAGLAR KWIKPEN) 100 UNIT/ML SOPN Inject 0.55 mLs (55 Units total) into the skin at bedtime. (Patient not taking: Reported on 07/10/2016)  . NOVOFINE 30G X 8 MM MISC USE ONE TIME DAILY  WITH  LANTUS  PEN (Patient not taking: Reported on 07/10/2016)   No current facility-administered medications on file prior to visit.     Review of Systems Per HPI unless specifically indicated in ROS section     Objective:    BP 136/64   Pulse 98   Temp 97.5 F (36.4 C) (Oral)   Wt (!) 309 lb 4 oz (140.3 kg)   SpO2 97%   BMI 53.08 kg/m   Wt Readings from Last 3 Encounters:  07/10/16 (!) 309 lb 4 oz (140.3 kg)  04/07/16 (Marland Kitchen  305 lb 8 oz (138.6 kg)  11/29/15 297 lb 4 oz (134.8 kg)    Physical Exam  Constitutional: She appears well-developed and well-nourished. No distress.  HENT:  Mouth/Throat: Oropharynx is clear and moist. No oropharyngeal exudate.  Neck: No thyromegaly present.  R neck mass  Cardiovascular: Normal rate, regular rhythm, normal heart sounds and intact distal pulses.   No murmur heard. Pulmonary/Chest: Effort normal and breath sounds normal. No respiratory distress. She has no wheezes. She has no rales.  Musculoskeletal: She exhibits no edema.  Lymphadenopathy:    She has no cervical adenopathy.  Skin: Skin is warm and dry. No rash noted.  Psychiatric: She has a normal mood and affect.  Nursing note and vitals  reviewed.  Results for orders placed or performed in visit on 06/01/16  HM MAMMOGRAPHY  Result Value Ref Range   HM Mammogram 0-4 Bi-Rad 0-4 Bi-Rad, Self Reported Normal   Lab Results  Component Value Date   HGBA1C 6.2 03/30/2016    Lab Results  Component Value Date   TSH 6.48 (H) 03/30/2016       Assessment & Plan:   Problem List Items Addressed This Visit    Diabetes mellitus type 2, controlled, with complications ([place]) - Primary    Ongoing great control on NPH and actos. Will decrease amaryl due to intermittent low cbg's pre lunch. Continue metformin and other medications as up to now.       Relevant Medications   glimepiride (AMARYL) 2 MG tablet   Other Relevant Orders   Hemoglobin A1c   Hypothyroidism    Update TSH after recent levothyroxine dosing change.      Relevant Orders   TSH   Mass of right side of neck    Per patient present for last several months - will order thyroid US to start, consider neck CT if unrevealing.       Relevant Orders   US THYROID       Follow up plan: Return in about 3 months (around 10/10/2016) for follow up visit.  Ria Bush, MD

## 2016-07-17 ENCOUNTER — Ambulatory Visit
Admission: RE | Admit: 2016-07-17 | Discharge: 2016-07-17 | Disposition: A | Discharge status: Home / Self Care | Payer: Commercial Managed Care - HMO | Source: Ambulatory Visit | Attending: Family Medicine | Admitting: Family Medicine

## 2016-07-17 DIAGNOSIS — R221 Localized swelling, mass and lump, neck: Secondary | ICD-10-CM | POA: Diagnosis present

## 2016-07-17 DIAGNOSIS — E041 Nontoxic single thyroid nodule: Secondary | ICD-10-CM | POA: Insufficient documentation

## 2016-09-04 ENCOUNTER — Encounter: Payer: Self-pay | Admitting: Family Medicine

## 2016-09-05 MED ORDER — INSULIN NPH (HUMAN) (ISOPHANE) 100 UNIT/ML ~~LOC~~ SUSP: 55.0000 [IU] | Freq: Every day | SUBCUTANEOUS | 6 refills | Status: DC

## 2016-09-12 ENCOUNTER — Encounter: Payer: Self-pay | Admitting: Family Medicine

## 2016-09-12 MED ORDER — INSULIN NPH (HUMAN) (ISOPHANE) 100 UNIT/ML ~~LOC~~ SUSP: 55.0000 [IU] | Freq: Every day | SUBCUTANEOUS | 6 refills | Status: DC

## 2016-09-12 MED ORDER — INSULIN NPH (HUMAN) (ISOPHANE) 100 UNIT/ML ~~LOC~~ SUSP: 55.0000 [IU] | Freq: Every day | SUBCUTANEOUS | 1 refills | Status: DC

## 2016-10-06 ENCOUNTER — Encounter: Payer: Self-pay | Admitting: Family Medicine

## 2016-10-06 MED ORDER — METFORMIN HCL 500 MG PO TABS: ORAL_TABLET | ORAL | 3 refills | Status: DC

## 2016-10-10 ENCOUNTER — Ambulatory Visit (INDEPENDENT_AMBULATORY_CARE_PROVIDER_SITE_OTHER)
Admission: RE | Admit: 2016-10-10 | Discharge: 2016-10-10 | Disposition: A | Discharge status: Home / Self Care | Payer: Medicare HMO | Source: Ambulatory Visit | Attending: Family Medicine | Admitting: Family Medicine

## 2016-10-10 ENCOUNTER — Encounter: Payer: Self-pay | Admitting: Family Medicine

## 2016-10-10 ENCOUNTER — Ambulatory Visit (INDEPENDENT_AMBULATORY_CARE_PROVIDER_SITE_OTHER): Payer: Medicare HMO | Admitting: Family Medicine

## 2016-10-10 VITALS — BP 118/62 | HR 90 | Temp 97.6°F | Wt 312.8 lb

## 2016-10-10 DIAGNOSIS — R0609 Other forms of dyspnea: Secondary | ICD-10-CM

## 2016-10-10 DIAGNOSIS — I10 Essential (primary) hypertension: Secondary | ICD-10-CM | POA: Diagnosis not present

## 2016-10-10 DIAGNOSIS — E118 Type 2 diabetes mellitus with unspecified complications: Secondary | ICD-10-CM | POA: Diagnosis not present

## 2016-10-10 DIAGNOSIS — R0600 Dyspnea, unspecified: Secondary | ICD-10-CM

## 2016-10-10 DIAGNOSIS — R69 Illness, unspecified: Secondary | ICD-10-CM | POA: Diagnosis not present

## 2016-10-10 DIAGNOSIS — F3178 Bipolar disorder, in full remission, most recent episode mixed: Secondary | ICD-10-CM

## 2016-10-10 DIAGNOSIS — Z794 Long term (current) use of insulin: Secondary | ICD-10-CM | POA: Diagnosis not present

## 2016-10-10 LAB — BASIC METABOLIC PANEL
BUN: 18 mg/dL (ref 6–23)
CALCIUM: 10.2 mg/dL (ref 8.4–10.5)
CO2: 29 mEq/L (ref 19–32)
CREATININE: 0.85 mg/dL (ref 0.40–1.20)
Chloride: 100 mEq/L (ref 96–112)
GFR: 71.55 mL/min (ref >60.00)
Glucose, Bld: 118 mg/dL — ABNORMAL HIGH (ref 70–99)
Potassium: 4.1 mEq/L (ref 3.5–5.1)
Sodium: 140 mEq/L (ref 135–145)

## 2016-10-10 LAB — HEMOGLOBIN A1C: HEMOGLOBIN A1C: 6.2 % (ref 4.6–6.5)

## 2016-10-10 LAB — BRAIN NATRIURETIC PEPTIDE: PRO B NATRI PEPTIDE: 28 pg/mL (ref 0.0–100.0)

## 2016-10-10 MED ORDER — PIOGLITAZONE HCL 15 MG PO TABS: 15.0000 mg | ORAL_TABLET | Freq: Every day | ORAL | 1 refills | Status: DC

## 2016-10-10 NOTE — Progress Notes (Signed)
Pre visit review using our clinic review tool, if applicable. No additional management support is needed unless otherwise documented below in the visit note. 

## 2016-10-10 NOTE — Assessment & Plan Note (Signed)
Chronic, stable. Discussed concerns with geodon use and weight gain, pt feels very stable on this regimen and is resistant to any psych med changes.

## 2016-10-10 NOTE — Assessment & Plan Note (Signed)
Chronic, stable. Continue current regimen. 

## 2016-10-10 NOTE — Patient Instructions (Addendum)
Let's decrease actos to 15mg  daily - cut in half, new dose sent to pharmacy.  Ambulatory pulse ox today.  Labs today Xray today.  Return in 3 months for follow up visit.

## 2016-10-10 NOTE — Progress Notes (Signed)
BP 118/62 (BP Location: Left Arm, Patient Position: Sitting, Cuff Size: Large)   Pulse 90   Temp 97.6 F (36.4 C) (Oral)   Wt (!) 312 lb 12.8 oz (141.9 kg)   SpO2 96%   BMI 53.69 kg/m    CC: 87mof/u visit Subjective:    Patient ID: [name] female    DOB: [birth year] 66y.o.   MRN: [id number] HPI: Jessica AGROis a 66y.o. female presenting on 10/10/2016 for Follow-up   DM - actos has led to wonderful sugar control, however ongoing weight gain noted. Last visit we decreased amaryl due to hypoglycemia - this has resolved. Regularly does check sugars - [name] forgot her log today but recall - peak am fasting 123, post prandial 120s. Compliant with antihyperglycemic regimen which includes: amaryl 226mdaily, actos 3079maily and novoling N (NPH)55u at bedtime. Denies low sugars or hypoglycemic symptoms.  Denies paresthesias. Last diabetic eye exam pt states [name] had this 11/2015.  Pneumovax: 2012.  Prevnar: not due yet. Progressive exertional dyspnea. No chest pain or tightness. No pedal edema.  Lab Results  Component Value Date   HGBA1C 6.1 07/10/2016   Diabetic Foot Exam - Simple   No data filed     not done  Relevant past medical, surgical, family and social history reviewed and updated as indicated. Interim medical history since our last visit reviewed. Allergies and medications reviewed and updated.  Outpatient Medications Prior to Visit  Medication Sig Dispense Refill  . ACCU-CHEK FASTCLIX LANCETS MISC Use to check sugar twice daily Dx: E11.65 204 each 3  . ACCU-CHEK SMARTVIEW test strip USE AS INSTRUCTED TO CHECK SUGAR TWICE DAILY. 200 each 4  . aspirin EC 81 MG tablet Take 162 mg by mouth daily.    . Blood Glucose Monitoring Suppl (ACCU-CHEK NANO SMARTVIEW) W/DEVICE KIT Use to check sugar twice daily Dx: E11.65 1 kit 0  . famotidine (PEPCID) 20 MG tablet Take 20 mg by mouth daily.    . [name] (AMARYL) 2 MG tablet Take 1 tablet (2 mg total) by mouth daily with  breakfast.    . hydrochlorothiazide (HYDRODIURIL) 25 MG tablet TAKE 1 TABLET EVERY DAY 90 tablet 3  . INS SYRINGE/NEEDLE 1CC/28G 28G X 1/2" 1 ML MISC Use as directed 100 each 3  . insulin NPH Human (NOVOLIN N) 100 UNIT/ML injection Inject 0.55 mLs (55 Units total) into the skin at bedtime. 6 vial 1  . levothyroxine (SYNTHROID, LEVOTHROID) 100 MCG tablet Take 1 tablet (100 mcg total) by mouth daily. Two days a week take extra 1/2 tablet daily    . lisinopril (PRINIVIL,ZESTRIL) 5 MG tablet TAKE 1 TABLET EVERY DAY 90 tablet 2  . metFORMIN (GLUCOPHAGE) 500 MG tablet TAKE 2 TABLETS IN THE MORNING AND 2 TABLETS AT NIGHT 360 tablet 3  . metoprolol tartrate (LOPRESSOR) 25 MG tablet TAKE 1/2 TABLET TWICE DAILY 90 tablet 3  . Multiple Vitamin (MULTIVITAMIN) tablet Take 1 tablet by mouth daily.    . Naproxen Sodium 220 MG CAPS Take 440 mg by mouth 2 (two) times daily.    . Jessica KitchenVOFINE 30G X 8 MM MISC USE ONE TIME DAILY  WITH  LANTUS  PEN 100 each 3  . nystatin cream (MYCOSTATIN) APPLY TOPICALLY TWICE DAILY 30 g 1  . simvastatin (ZOCOR) 20 MG tablet Take 1 tablet (20 mg total) by mouth at bedtime. 90 tablet 3  . ziprasidone (GEODON) 40 MG capsule TAKE 1 CAPSULE TWICE DAILY  WITH MEALS 180 capsule 1  . Insulin Glargine (BASAGLAR KWIKPEN) 100 UNIT/ML SOPN Inject 0.55 mLs (55 Units total) into the skin at bedtime. 6 pen 6  . pioglitazone (ACTOS) 30 MG tablet Take 1 tablet (30 mg total) by mouth daily. 90 tablet 3   No facility-administered medications prior to visit.      Per HPI unless specifically indicated in ROS section below Review of Systems     Objective:    BP 118/62 (BP Location: Left Arm, Patient Position: Sitting, Cuff Size: Large)   Pulse 90   Temp 97.6 F (36.4 C) (Oral)   Wt (!) 312 lb 12.8 oz (141.9 kg)   SpO2 96%   BMI 53.69 kg/m   Wt Readings from Last 3 Encounters:  10/10/16 (!) 312 lb 12.8 oz (141.9 kg)  07/10/16 (!) 309 lb 4 oz (140.3 kg)  04/07/16 (!) 305 lb 8 oz (138.6 kg)      Physical Exam  Constitutional: [name] appears well-developed and well-nourished. No distress.  HENT:  Head: Normocephalic and atraumatic.  Right Ear: External ear normal.  Left Ear: External ear normal.  Nose: Nose normal.  Mouth/Throat: Oropharynx is clear and moist. No oropharyngeal exudate.  Eyes: Conjunctivae and EOM are normal. Pupils are equal, round, and reactive to light. No scleral icterus.  Neck: Normal range of motion. Neck supple.  Cardiovascular: Normal rate, regular rhythm, normal heart sounds and intact distal pulses.   No murmur heard. Pulmonary/Chest: Effort normal and breath sounds normal. No respiratory distress. [name] has no wheezes. [name] has no rales.  Lungs clear  Musculoskeletal: [name] exhibits no edema.  See HPI for foot exam if done  Lymphadenopathy:    [name] has no cervical adenopathy.  Skin: Skin is warm and dry. No rash noted.  Psychiatric: [name] has a normal mood and affect.  Nursing note and vitals reviewed.  Results for orders placed or performed in visit on 10/10/16  HM DIABETES EYE EXAM  Result Value Ref Range   HM Diabetic Eye Exam No Retinopathy No Retinopathy      Assessment & Plan:   Problem List Items Addressed This Visit    Bipolar disorder ([place])    Chronic, stable. Discussed concerns with geodon use and weight gain, pt feels very stable on this regimen and is resistant to any psych med changes.       Diabetes mellitus type 2, controlled, with complications ([place]) - Primary    Chronic, stable on current regimen of actos, metformin, NPH and glimepiride. However concern for progressive dyspnea and weight gain in setting of actos use - so will decrease actos to 67m daily. Check BNP , CXRtoday      Relevant Medications   pioglitazone (ACTOS) 15 MG tablet   Other Relevant Orders   Basic metabolic panel   Hemoglobin A1c   Dyspnea on exertion    ?CHF related as pt on actos - will decrease actos to 178mdaily, check BNP, check CXR today.  Ambulatory  pulse ox today as well.       Relevant Orders   Brain natriuretic peptide   DG Chest 2 View   Hypertension    Chronic, stable. Continue current regimen.           Follow up plan: No Follow-up on file.  JaRia BushMD

## 2016-10-10 NOTE — Assessment & Plan Note (Signed)
?  CHF related as pt on actos - will decrease actos to 15mg  daily, check BNP, check CXR today.  Ambulatory pulse ox today as well.

## 2016-10-10 NOTE — Assessment & Plan Note (Signed)
Chronic, stable on current regimen of actos, metformin, NPH and glimepiride. However concern for progressive dyspnea and weight gain in setting of actos use - so will decrease actos to 15mg  daily. Check BNP , CXRtoday

## 2016-10-23 ENCOUNTER — Encounter: Payer: Self-pay | Admitting: Family Medicine

## 2016-10-23 MED ORDER — "INSULIN SYRINGE/NEEDLE 28G X 1/2"" 1 ML MISC": 3 refills | Status: DC

## 2016-10-23 NOTE — Telephone Encounter (Signed)
?   Too early for the Geodon?-please verify  Please send in the diabetic equipment

## 2016-10-23 NOTE — Telephone Encounter (Signed)
Ok to refill in Dr. Synthia Innocent absence? Last filled 06/20/16 #180 1RF.

## 2016-10-24 ENCOUNTER — Encounter: Payer: Self-pay | Admitting: Family Medicine

## 2016-10-24 NOTE — Telephone Encounter (Signed)
Mychart message sent to patient for verification. Insulin syringes sent in.

## 2016-10-25 MED ORDER — ZIPRASIDONE HCL 40 MG PO CAPS: ORAL_CAPSULE | ORAL | 0 refills | Status: DC

## 2016-10-25 NOTE — Telephone Encounter (Signed)
Dr. Darene Lamer- Mychart message from the patient in regards to her geodon refill. It goes to mail order, so I think it may be time for the refill.  Thanks!

## 2016-10-25 NOTE — Telephone Encounter (Signed)
Refilled times one in PCP absence  Elect done

## 2016-11-10 ENCOUNTER — Encounter: Payer: Self-pay | Admitting: Family Medicine

## 2016-11-10 MED ORDER — SIMVASTATIN 20 MG PO TABS: 20.0000 mg | ORAL_TABLET | Freq: Every day | ORAL | 1 refills | Status: DC

## 2016-11-10 MED ORDER — GLUCOSE BLOOD VI STRP: ORAL_STRIP | 1 refills | Status: DC

## 2016-11-15 ENCOUNTER — Encounter: Payer: Self-pay | Admitting: Family Medicine

## 2016-11-15 MED ORDER — ONETOUCH LANCETS MISC: 3 refills | Status: DC

## 2016-11-15 MED ORDER — ONETOUCH ULTRA SYSTEM W/DEVICE KIT: 1.0000 | PACK | Freq: Once | 0 refills | Status: DC

## 2016-11-15 MED ORDER — GLUCOSE BLOOD VI STRP: ORAL_STRIP | 3 refills | Status: DC

## 2016-11-16 DIAGNOSIS — R69 Illness, unspecified: Secondary | ICD-10-CM | POA: Diagnosis not present

## 2016-12-02 DIAGNOSIS — H04123 Dry eye syndrome of bilateral lacrimal glands: Secondary | ICD-10-CM | POA: Diagnosis not present

## 2016-12-02 DIAGNOSIS — E089 Diabetes mellitus due to underlying condition without complications: Secondary | ICD-10-CM | POA: Diagnosis not present

## 2016-12-02 DIAGNOSIS — E119 Type 2 diabetes mellitus without complications: Secondary | ICD-10-CM | POA: Diagnosis not present

## 2016-12-02 DIAGNOSIS — H2513 Age-related nuclear cataract, bilateral: Secondary | ICD-10-CM | POA: Diagnosis not present

## 2016-12-06 LAB — HM DIABETES EYE EXAM

## 2016-12-17 ENCOUNTER — Encounter: Payer: Self-pay | Admitting: Family Medicine

## 2016-12-18 MED ORDER — LEVOTHYROXINE SODIUM 100 MCG PO TABS: 100.0000 ug | ORAL_TABLET | Freq: Every day | ORAL | 2 refills | Status: DC

## 2016-12-18 MED ORDER — METOPROLOL TARTRATE 25 MG PO TABS
12.5000 mg | ORAL_TABLET | Freq: Two times a day (BID) | ORAL | 1 refills | Status: DC
Start: 2016-12-18 — End: 2017-01-08

## 2016-12-27 ENCOUNTER — Encounter: Payer: Self-pay | Admitting: Family Medicine

## 2016-12-28 MED ORDER — NYSTATIN 100000 UNIT/GM EX CREA: TOPICAL_CREAM | Freq: Two times a day (BID) | CUTANEOUS | 1 refills | Status: DC

## 2016-12-29 ENCOUNTER — Encounter: Payer: Self-pay | Admitting: Family Medicine

## 2017-01-08 ENCOUNTER — Encounter: Payer: Self-pay | Admitting: Family Medicine

## 2017-01-08 ENCOUNTER — Ambulatory Visit (INDEPENDENT_AMBULATORY_CARE_PROVIDER_SITE_OTHER): Payer: Medicare HMO | Admitting: Family Medicine

## 2017-01-08 VITALS — BP 116/68 | HR 99 | Temp 97.6°F | Wt 307.0 lb

## 2017-01-08 DIAGNOSIS — R000 Tachycardia, unspecified: Secondary | ICD-10-CM

## 2017-01-08 DIAGNOSIS — R002 Palpitations: Secondary | ICD-10-CM

## 2017-01-08 DIAGNOSIS — E118 Type 2 diabetes mellitus with unspecified complications: Secondary | ICD-10-CM

## 2017-01-08 DIAGNOSIS — Z794 Long term (current) use of insulin: Secondary | ICD-10-CM

## 2017-01-08 DIAGNOSIS — R0609 Other forms of dyspnea: Secondary | ICD-10-CM | POA: Diagnosis not present

## 2017-01-08 DIAGNOSIS — R0600 Dyspnea, unspecified: Secondary | ICD-10-CM

## 2017-01-08 LAB — BASIC METABOLIC PANEL
BUN: 18 mg/dL (ref 6–23)
CALCIUM: 9.8 mg/dL (ref 8.4–10.5)
CO2: 28 mEq/L (ref 19–32)
Chloride: 98 mEq/L (ref 96–112)
Creatinine, Ser: 0.93 mg/dL (ref 0.40–1.20)
GFR: 64.45 mL/min (ref >60.00)
GLUCOSE: 227 mg/dL — AB (ref 70–99)
Potassium: 4 mEq/L (ref 3.5–5.1)
SODIUM: 138 meq/L (ref 135–145)

## 2017-01-08 LAB — HEMOGLOBIN A1C: HEMOGLOBIN A1C: 6.6 % — AB (ref 4.6–6.5)

## 2017-01-08 MED ORDER — CARVEDILOL 3.125 MG PO TABS: 3.1250 mg | ORAL_TABLET | Freq: Two times a day (BID) | ORAL | 3 refills | Status: DC

## 2017-01-08 MED ORDER — HYDROCHLOROTHIAZIDE 25 MG PO TABS: 25.0000 mg | ORAL_TABLET | Freq: Every day | ORAL | 3 refills | Status: DC

## 2017-01-08 NOTE — Assessment & Plan Note (Signed)
Few episodes of tachy-palpitations at rest. Update EKG.

## 2017-01-08 NOTE — Patient Instructions (Addendum)
EKG today. Labs today. Change metoprolol to carvedilol 3.125mg  twice daily.  You are doing well today. Continue checking sugars.  Return as needed or in 3 months for medicare wellness visit with Katha Cabal and physical with me.

## 2017-01-08 NOTE — Assessment & Plan Note (Signed)
Change metoprolol to carvedilol for better glycemic effect.

## 2017-01-08 NOTE — Assessment & Plan Note (Signed)
Chronic, overall stable. cbg's trending up on lower actos dose. Will continue current regimen. Update A1c.

## 2017-01-08 NOTE — Progress Notes (Addendum)
BP 116/68 (BP Location: Left Arm, Patient Position: Sitting, Cuff Size: Large)   Pulse 99   Temp 97.6 F (36.4 C) (Oral)   Wt (!) 307 lb (139.3 kg)   SpO2 96%   BMI 52.70 kg/m    CC: 3 mo f/u visit Subjective:    Patient ID: [name], female    DOB: [birth year], 66 y.o.   MRN: [id number]  HPI: [name] is a 67 y.o. female presenting on 01/08/2017 for Diabetes (3 month follow-up. Says her nmbers have been elevated at home.) and Palpitations (Started a few weeks ago with intermittent episodes.)   In wheelchair today - due to dyspnea and knee pain.   DM - regularly does check sugars and brings log: am 100-140, PM 100-199. Compliant with antihyperglycemic regimen which includes: amaryl 2mg  daily, metformin 1000mg  bid, actos 15mg  daily, NPH 55u bedtime. Denies low sugars or hypoglycemic symptoms.  Denies paresthesias. Last diabetic eye exam 11/2016.  Pneumovax: 2012.  Prevnar: not due yet. Lab Results  Component Value Date   HGBA1C 6.2 10/10/2016   Diabetic Foot Exam - Simple   Simple Foot Form Diabetic Foot exam was performed with the following findings:  Yes 01/08/2017  2:20 PM  Visual Inspection See comments:  Yes Sensation Testing Intact to touch and monofilament testing bilaterally:  Yes Pulse Check See comments:  Yes Comments Diminished pulses Scaling of feet      Palpitations - noted over the past month - have happened while sitting in bed. Presyncopal but no LOC.  Significant dyspnea attributed to deconditioning.   Relevant past medical, surgical, family and social history reviewed and updated as indicated. Interim medical history since our last visit reviewed. Allergies and medications reviewed and updated. Outpatient Medications Prior to Visit  Medication Sig Dispense Refill  . aspirin EC 81 MG tablet Take 162 mg by mouth daily.    . famotidine (PEPCID) 20 MG tablet Take 20 mg by mouth daily.    Marland Kitchen glimepiride (AMARYL) 2 MG tablet Take 1 tablet (2 mg  total) by mouth daily with breakfast.    . glucose blood test strip Use as instructed 200 each 3  . INS SYRINGE/NEEDLE 1CC/28G 28G X 1/2" 1 ML MISC Use as directed 100 each 3  . insulin NPH Human (NOVOLIN N) 100 UNIT/ML injection Inject 0.55 mLs (55 Units total) into the skin at bedtime. 6 vial 1  . levothyroxine (SYNTHROID, LEVOTHROID) 100 MCG tablet Take 1 tablet (100 mcg total) by mouth daily. Two days a week take extra 1/2 tablet daily 90 tablet 2  . lisinopril (PRINIVIL,ZESTRIL) 5 MG tablet TAKE 1 TABLET EVERY DAY 90 tablet 2  . metFORMIN (GLUCOPHAGE) 500 MG tablet TAKE 2 TABLETS IN THE MORNING AND 2 TABLETS AT NIGHT 360 tablet 3  . Multiple Vitamin (MULTIVITAMIN) tablet Take 1 tablet by mouth daily.    . Naproxen Sodium 220 MG CAPS Take 440 mg by mouth 2 (two) times daily.    Marland Kitchen nystatin cream (MYCOSTATIN) Apply topically 2 (two) times daily. 30 g 1  . ONE TOUCH LANCETS MISC Use to check sugar twice daily Dx: E11.8 200 each 3  . pioglitazone (ACTOS) 15 MG tablet Take 1 tablet (15 mg total) by mouth daily. 90 tablet 1  . simvastatin (ZOCOR) 20 MG tablet Take 1 tablet (20 mg total) by mouth at bedtime. 90 tablet 1  . ziprasidone (GEODON) 40 MG capsule TAKE 1 CAPSULE TWICE DAILY WITH MEALS 180 capsule 0  .  hydrochlorothiazide (HYDRODIURIL) 25 MG tablet TAKE 1 TABLET EVERY DAY 90 tablet 3  . metoprolol tartrate (LOPRESSOR) 25 MG tablet Take 0.5 tablets (12.5 mg total) by mouth 2 (two) times daily. 90 tablet 1  . NOVOFINE 30G X 8 MM MISC USE ONE TIME DAILY  WITH  LANTUS  PEN 100 each 3   No facility-administered medications prior to visit.      Per HPI unless specifically indicated in ROS section below Review of Systems     Objective:    BP 116/68 (BP Location: Left Arm, Patient Position: Sitting, Cuff Size: Large)   Pulse 99   Temp 97.6 F (36.4 C) (Oral)   Wt (!) 307 lb (139.3 kg)   SpO2 96%   BMI 52.70 kg/m   Wt Readings from Last 3 Encounters:  01/08/17 (!) 307 lb (139.3 kg)    10/10/16 (!) 312 lb 12.8 oz (141.9 kg)  07/10/16 (!) 309 lb 4 oz (140.3 kg)    Physical Exam  Constitutional: She appears well-developed and well-nourished. No distress.  Morbidly obese in wheelchair  HENT:  Head: Normocephalic and atraumatic.  Right Ear: External ear normal.  Left Ear: External ear normal.  Nose: Nose normal.  Mouth/Throat: Oropharynx is clear and moist. No oropharyngeal exudate.  Eyes: Conjunctivae and EOM are normal. Pupils are equal, round, and reactive to light. No scleral icterus.  Neck: Normal range of motion. Neck supple.  Cardiovascular: Normal rate, regular rhythm, normal heart sounds and intact distal pulses.   No murmur heard. Pulmonary/Chest: Effort normal and breath sounds normal. No respiratory distress. She has no wheezes. She has no rales.  Musculoskeletal: She exhibits no edema.  See HPI for foot exam if done  Lymphadenopathy:    She has no cervical adenopathy.  Skin: Skin is warm and dry. No rash noted.  Psychiatric: She has a normal mood and affect.  Nursing note and vitals reviewed.  Results for orders placed or performed in visit on 12/29/16  HM DIABETES EYE EXAM  Result Value Ref Range   HM Diabetic Eye Exam No Retinopathy No Retinopathy   EKG - NSR rate 90s, normal axis, intervals, diffuse T wave flattening similar to prior EKG 2013, 2012    Assessment & Plan:   Problem List Items Addressed This Visit    Diabetes mellitus type 2, controlled, with complications ([place]) - Primary    Chronic, overall stable. cbg's trending up on lower actos dose. Will continue current regimen. Update A1c.       Relevant Orders   Hemoglobin O9G   Basic metabolic panel   Dyspnea on exertion    Persistent despite lower actos dose.  Consider echocardiogram.  Update EKG today.       Palpitations    Few episodes of tachy-palpitations at rest. Update EKG.       Tachycardia    Change metoprolol to carvedilol for better glycemic effect.            Follow up plan: Return in about 3 months (around 04/10/2017) for annual exam, prior fasting for blood work, medicare wellness visit.  Ria Bush, MD

## 2017-01-08 NOTE — Addendum Note (Signed)
Addended by: Pilar Grammes on: 01/08/2017 02:48 PM   Modules accepted: Orders

## 2017-01-08 NOTE — Assessment & Plan Note (Signed)
Persistent despite lower actos dose.  Consider echocardiogram.  Update EKG today.

## 2017-01-09 NOTE — Addendum Note (Signed)
Addended by: Ria Bush on: 01/09/2017 09:26 PM   Modules accepted: Orders

## 2017-01-24 DIAGNOSIS — R69 Illness, unspecified: Secondary | ICD-10-CM | POA: Diagnosis not present

## 2017-02-06 ENCOUNTER — Encounter: Payer: Self-pay | Admitting: Family Medicine

## 2017-02-08 ENCOUNTER — Encounter: Payer: Self-pay | Admitting: Family Medicine

## 2017-02-08 ENCOUNTER — Other Ambulatory Visit: Payer: Self-pay | Admitting: Family Medicine

## 2017-02-08 MED ORDER — GLIMEPIRIDE 2 MG PO TABS: 2.0000 mg | ORAL_TABLET | Freq: Every day | ORAL | 1 refills | Status: DC

## 2017-02-08 MED ORDER — ZIPRASIDONE HCL 40 MG PO CAPS: ORAL_CAPSULE | ORAL | 1 refills | Status: DC

## 2017-02-08 NOTE — Telephone Encounter (Signed)
Taken care of - see other pt email

## 2017-02-12 ENCOUNTER — Encounter: Payer: Self-pay | Admitting: Family Medicine

## 2017-02-15 ENCOUNTER — Ambulatory Visit (INDEPENDENT_AMBULATORY_CARE_PROVIDER_SITE_OTHER): Payer: Medicare HMO

## 2017-02-15 ENCOUNTER — Other Ambulatory Visit: Payer: Self-pay

## 2017-02-15 DIAGNOSIS — R0609 Other forms of dyspnea: Secondary | ICD-10-CM

## 2017-02-15 DIAGNOSIS — R0600 Dyspnea, unspecified: Secondary | ICD-10-CM

## 2017-02-20 ENCOUNTER — Encounter: Payer: Self-pay | Admitting: Family Medicine

## 2017-02-21 MED ORDER — GLIMEPIRIDE 4 MG PO TABS
4.0000 mg | ORAL_TABLET | Freq: Every day | ORAL | 1 refills | Status: DC
Start: 2017-02-21 — End: 2017-08-15

## 2017-03-02 ENCOUNTER — Other Ambulatory Visit: Payer: Self-pay | Admitting: Family Medicine

## 2017-03-23 ENCOUNTER — Other Ambulatory Visit: Payer: Self-pay | Admitting: Family Medicine

## 2017-04-06 DIAGNOSIS — R69 Illness, unspecified: Secondary | ICD-10-CM | POA: Diagnosis not present

## 2017-04-10 ENCOUNTER — Ambulatory Visit (INDEPENDENT_AMBULATORY_CARE_PROVIDER_SITE_OTHER): Payer: Medicare HMO

## 2017-04-10 VITALS — BP 118/70 | HR 82 | Temp 97.4°F | Wt 300.0 lb

## 2017-04-10 DIAGNOSIS — Z794 Long term (current) use of insulin: Secondary | ICD-10-CM

## 2017-04-10 DIAGNOSIS — E784 Other hyperlipidemia: Secondary | ICD-10-CM | POA: Diagnosis not present

## 2017-04-10 DIAGNOSIS — E118 Type 2 diabetes mellitus with unspecified complications: Secondary | ICD-10-CM | POA: Diagnosis not present

## 2017-04-10 DIAGNOSIS — E038 Other specified hypothyroidism: Secondary | ICD-10-CM

## 2017-04-10 DIAGNOSIS — E7849 Other hyperlipidemia: Secondary | ICD-10-CM

## 2017-04-10 DIAGNOSIS — Z0000 Encounter for general adult medical examination without abnormal findings: Secondary | ICD-10-CM

## 2017-04-10 DIAGNOSIS — I10 Essential (primary) hypertension: Secondary | ICD-10-CM

## 2017-04-10 LAB — CBC WITH DIFFERENTIAL/PLATELET
BASOS PCT: 0.7 % (ref 0.0–3.0)
Basophils Absolute: 0.1 10*3/uL (ref 0.0–0.1)
Eosinophils Absolute: 0.1 10*3/uL (ref 0.0–0.7)
Eosinophils Relative: 1.1 % (ref 0.0–5.0)
HEMATOCRIT: 38.8 % (ref 36.0–46.0)
Hemoglobin: 12.5 g/dL (ref 12.0–15.0)
LYMPHS ABS: 2.1 10*3/uL (ref 0.7–4.0)
LYMPHS PCT: 16.7 % (ref 12.0–46.0)
MCHC: 32.3 g/dL (ref 30.0–36.0)
MCV: 90.9 fl (ref 78.0–100.0)
MONOS PCT: 5.5 % (ref 3.0–12.0)
Monocytes Absolute: 0.7 10*3/uL (ref 0.1–1.0)
NEUTROS ABS: 9.5 10*3/uL — AB (ref 1.4–7.7)
Neutrophils Relative %: 76 % (ref 43.0–77.0)
PLATELETS: 375 10*3/uL (ref 150.0–400.0)
RBC: 4.26 Mil/uL (ref 3.87–5.11)
RDW: 15.6 % — AB (ref 11.5–15.5)
WBC: 12.6 10*3/uL — ABNORMAL HIGH (ref 4.0–10.5)

## 2017-04-10 LAB — LIPID PANEL
CHOL/HDL RATIO: 3
Cholesterol: 119 mg/dL (ref 0–200)
HDL: 36.6 mg/dL — ABNORMAL LOW (ref >39.00)
NONHDL: 82.35
TRIGLYCERIDES: 242 mg/dL — AB (ref 0.0–149.0)
VLDL: 48.4 mg/dL — ABNORMAL HIGH (ref 0.0–40.0)

## 2017-04-10 LAB — COMPREHENSIVE METABOLIC PANEL
ALT: 18 U/L (ref 0–35)
AST: 16 U/L (ref 0–37)
Albumin: 3.9 g/dL (ref 3.5–5.2)
Alkaline Phosphatase: 49 U/L (ref 39–117)
BUN: 15 mg/dL (ref 6–23)
CO2: 32 meq/L (ref 19–32)
Calcium: 9.8 mg/dL (ref 8.4–10.5)
Chloride: 99 mEq/L (ref 96–112)
Creatinine, Ser: 0.85 mg/dL (ref 0.40–1.20)
GFR: 71.44 mL/min (ref >60.00)
GLUCOSE: 96 mg/dL (ref 70–99)
POTASSIUM: 4.3 meq/L (ref 3.5–5.1)
Sodium: 139 mEq/L (ref 135–145)
Total Bilirubin: 0.3 mg/dL (ref 0.2–1.2)
Total Protein: 8 g/dL (ref 6.0–8.3)

## 2017-04-10 LAB — T4, FREE: Free T4: 0.94 ng/dL (ref 0.60–1.60)

## 2017-04-10 LAB — HEMOGLOBIN A1C: Hgb A1c MFr Bld: 6.7 % — ABNORMAL HIGH (ref 4.6–6.5)

## 2017-04-10 LAB — TSH: TSH: 4.88 u[IU]/mL — ABNORMAL HIGH (ref 0.35–4.50)

## 2017-04-10 LAB — LDL CHOLESTEROL, DIRECT: LDL DIRECT: 62 mg/dL

## 2017-04-10 NOTE — Progress Notes (Signed)
PCP notes:   Health maintenance:  PAP smear - PCP please address at next appt Flu vaccine - addressed A1C - completed  Abnormal screenings:   Hearing - failed Mini-Cog score: 18/20 Depression score: 6  Patient concerns:   Hearing - patient reports recent hearing loss in left ear. Patient has been using OTC and at home ear wax removal remedies with minimally effective results.   Sleep management - patient reports she is experiencing long-term concerns with sleep management. Patient reports long-term use of Advil PM as a sleep aid.   Nurse concerns:  None  Next PCP appt:   04/17/17 @ 1500

## 2017-04-10 NOTE — Patient Instructions (Signed)
[name] , Thank you for taking time to come for your Medicare Wellness Visit. I appreciate your ongoing commitment to your health goals. Please review the following plan we discussed and let me know if I can assist you in the future.   These are the goals we discussed: Goals    . Increase water intake          Starting 04/10/17, I will continue to drink at least 6-8 glasses of water daily.        This is a list of the screening recommended for you and due dates:  Health Maintenance  Topic Date Due  . Pap Smear  04/17/2017*  . Flu Shot  11/18/2017*  . DTaP/Tdap/Td vaccine (1 - Tdap) 04/17/2021*  . Mammogram  05/31/2017  . Hemoglobin A1C  10/11/2017  . Eye exam for diabetics  12/06/2017  . Complete foot exam   01/08/2018  . Tetanus Vaccine  04/17/2021  . Colon Cancer Screening  01/09/2022  .  Hepatitis C: One time screening is recommended by Center for Disease Control  (CDC) for  adults born from [birth year] through 1963.   Completed  . HIV Screening  Completed  *Topic was postponed. The date shown is not the original due date.   Preventive Care for Adults  A healthy lifestyle and preventive care can promote health and wellness. Preventive health guidelines for adults include the following key practices.  . A routine yearly physical is a good way to check with your health care provider about your health and preventive screening. It is a chance to share any concerns and updates on your health and to receive a thorough exam.  . Visit your dentist for a routine exam and preventive care every 6 months. Brush your teeth twice a day and floss once a day. Good oral hygiene prevents tooth decay and gum disease.  . The frequency of eye exams is based on your age, health, family medical history, use  of contact lenses, and other factors. Follow your health care provider's ecommendations for frequency of eye exams.  . Eat a healthy diet. Foods like vegetables, fruits, whole grains, low-fat  dairy products, and lean protein foods contain the nutrients you need without too many calories. Decrease your intake of foods high in solid fats, added sugars, and salt. Eat the right amount of calories for you. Get information about a proper diet from your health care provider, if necessary.  . Regular physical exercise is one of the most important things you can do for your health. Most adults should get at least 150 minutes of moderate-intensity exercise (any activity that increases your heart rate and causes you to sweat) each week. In addition, most adults need muscle-strengthening exercises on 2 or more days a week.  Silver Sneakers may be a benefit available to you. To determine eligibility, you may visit the website: www.silversneakers.com or contact program at (563)704-7605 Mon-Fri between 8AM-8PM.   . Maintain a healthy weight. The body mass index (BMI) is a screening tool to identify possible weight problems. It provides an estimate of body fat based on height and weight. Your health care provider can find your BMI and can help you achieve or maintain a healthy weight.   For adults 20 years and older: ? A BMI below 18.5 is considered underweight. ? A BMI of 18.5 to 24.9 is normal. ? A BMI of 25 to 29.9 is considered overweight. ? A BMI of 30 and above is considered obese.   Marland Kitchen  Maintain normal blood lipids and cholesterol levels by exercising and minimizing your intake of saturated fat. Eat a balanced diet with plenty of fruit and vegetables. Blood tests for lipids and cholesterol should begin at age 76 and be repeated every 5 years. If your lipid or cholesterol levels are high, you are over 50, or you are at high risk for heart disease, you may need your cholesterol levels checked more frequently. Ongoing high lipid and cholesterol levels should be treated with medicines if diet and exercise are not working.  . If you smoke, find out from your health care provider how to quit. If you do  not use tobacco, please do not start.  . If you choose to drink alcohol, please do not consume more than 2 drinks per day. One drink is considered to be 12 ounces (355 mL) of beer, 5 ounces (148 mL) of wine, or 1.5 ounces (44 mL) of liquor.  . If you are 73-46 years old, ask your health care provider if you should take aspirin to prevent strokes.  . Use sunscreen. Apply sunscreen liberally and repeatedly throughout the day. You should seek shade when your shadow is shorter than you. Protect yourself by wearing long sleeves, pants, a wide-brimmed hat, and sunglasses year round, whenever you are outdoors.  . Once a month, do a whole body skin exam, using a mirror to look at the skin on your back. Tell your health care provider of new moles, moles that have irregular borders, moles that are larger than a pencil eraser, or moles that have changed in shape or color.

## 2017-04-10 NOTE — Progress Notes (Signed)
Subjective:   [name] is a 66 y.o. female who presents for Medicare Annual (Subsequent) preventive examination.  Review of Systems:  N/A Cardiac Risk Factors include: advanced age (>30men, >21 women);hypertension;diabetes mellitus;dyslipidemia;obesity (BMI >30kg/m2)     Objective:     Vitals: BP 118/70 (BP Location: Right Arm, Patient Position: Sitting, Cuff Size: Large)   Pulse 82   Temp (!) 97.4 F (36.3 C) (Oral)   Wt 300 lb (136.1 kg)   SpO2 95%   BMI 51.49 kg/m   Body mass index is 51.49 kg/m.   Tobacco History  Smoking Status  . Former Smoker  . Packs/day: 1.00  . Years: 43.00  . Types: Cigarettes  . Quit date: 08/21/2009  Smokeless Tobacco  . Never Used     Counseling given: No   Past Medical History:  Diagnosis Date  . Anxiety   . Bipolar disorder ([place])    has stopped seeing psychiatrist  . Chronic bronchitis   . Coronary artery disease, non-occlusive   . Dyspnea 2012   s/p pulm/cards w/u WNL, thought due to obesity/deconditioning  . Fibromyalgia   . GERD (gastroesophageal reflux disease)   . History of chicken pox   . HLD (hyperlipidemia)   . Hypertension   . Hypothyroidism   . Migraine   . OSA (obstructive sleep apnea) 03/16/2011  . T2DM (type 2 diabetes mellitus) ([place]) 2012   DM education 06/2011  . Urinary incontinence    Past Surgical History:  Procedure Laterality Date  . cardiac catherization  03/2011   x3 [facility]), mild nonobstructive CAD  . COLONOSCOPY  12/2011   hyperplastic polyp x2,. diverticulosis, rec rpt 10 yrs  . ESOPHAGOGASTRODUODENOSCOPY  2006  . KNEE SURGERY  2006   left, torn meniscus  . TOTAL ABDOMINAL HYSTERECTOMY  2004   fibroids, cervix remained  . TOTAL KNEE ARTHROPLASTY Left 08/2012   [name], [facility]   Family History  Problem Relation Age of Onset  . Asthma Maternal Uncle   . Cancer Maternal Uncle        colon  . Aneurysm Maternal Uncle   . Colon cancer Maternal Uncle 9  . Hypertension  Mother   . Stroke Mother   . Arthritis Mother   . COPD Sister   . Lung cancer Cousin   . Cancer Cousin        lung  . Cancer Father 72       brain tumor  . Diabetes Father   . Thyroid disease Father   . Cancer Maternal Aunt 32       breast  . Cancer Paternal Uncle        prostate  . Coronary artery disease Neg Hx   . Stomach cancer Neg Hx    History  Sexual Activity  . Sexual activity: Not on file    Outpatient Encounter Prescriptions as of 04/10/2017  Medication Sig  . aspirin EC 81 MG tablet Take 162 mg by mouth daily.  . carvedilol (COREG) 3.125 MG tablet Take 1 tablet (3.125 mg total) by mouth 2 (two) times daily with a meal.  . famotidine (PEPCID) 20 MG tablet Take 20 mg by mouth daily.  Marland Kitchen glimepiride (AMARYL) 4 MG tablet Take 1 tablet (4 mg total) by mouth daily with breakfast.  . glucose blood test strip Use as instructed  . hydrochlorothiazide (HYDRODIURIL) 25 MG tablet Take 1 tablet (25 mg total) by mouth daily.  . INS SYRINGE/NEEDLE 1CC/28G 28G X 1/2" 1 ML MISC  Use as directed  . levothyroxine (SYNTHROID, LEVOTHROID) 100 MCG tablet Take 1 tablet (100 mcg total) by mouth daily. Two days a week take extra 1/2 tablet daily  . lisinopril (PRINIVIL,ZESTRIL) 5 MG tablet TAKE 1 TABLET EVERY DAY  . metFORMIN (GLUCOPHAGE) 500 MG tablet TAKE 2 TABLETS IN THE MORNING AND 2 TABLETS AT NIGHT  . Multiple Vitamin (MULTIVITAMIN) tablet Take 1 tablet by mouth daily.  . Naproxen Sodium 220 MG CAPS Take 440 mg by mouth 2 (two) times daily.  Marland Kitchen NOVOLIN N 100 UNIT/ML injection INJECT 55 UNITS            SUBCUTANEOUSLY AT BEDTIME  . nystatin cream (MYCOSTATIN) Apply topically 2 (two) times daily.  . ONE TOUCH LANCETS MISC Use to check sugar twice daily Dx: E11.8  . pioglitazone (ACTOS) 15 MG tablet TAKE 1 TABLET DAILY  . simvastatin (ZOCOR) 20 MG tablet Take 1 tablet (20 mg total) by mouth at bedtime.  . ziprasidone (GEODON) 40 MG capsule TAKE 1 CAPSULE TWICE DAILY WITH MEALS   No  facility-administered encounter medications on file as of 04/10/2017.     Activities of Daily Living In your present state of health, do you have any difficulty performing the following activities: 04/10/2017  Hearing? Y  Comment pt reports recent loss of hearing in left ear  Vision? N  Difficulty concentrating or making decisions? N  Walking or climbing stairs? Y  Dressing or bathing? N  Doing errands, shopping? Y  Preparing Food and eating ? N  Using the Toilet? N  In the past six months, have you accidently leaked urine? N  Do you have problems with loss of bowel control? N  Managing your Medications? N  Managing your Finances? N  Housekeeping or managing your Housekeeping? N  Some recent data might be hidden    Patient Care Team: Ria Bush, MD as PCP - General (Family Medicine) Bryson Ha, OD as Consulting Physician (Optometry)    Assessment:     Hearing Screening   125Hz  250Hz  500Hz  1000Hz  2000Hz  3000Hz  4000Hz  6000Hz  8000Hz   Right ear:   40 40 40  40    Left ear:   0 0 0  0    Vision Screening Comments: Last vision exam in April 2018 @ the [facility]   Exercise Activities and Dietary recommendations Current Exercise Habits: Home exercise routine, Type of exercise: Other - see comments (arm exercises only), Time (Minutes): 20, Frequency (Times/Week): 3, Weekly Exercise (Minutes/Week): 60, Intensity: Mild, Exercise limited by: None identified  Goals    . Increase water intake          Starting 04/10/17, I will continue to drink at least 6-8 glasses of water daily.       Fall Risk Fall Risk  04/10/2017 04/07/2016 01/22/2015 12/15/2013  Falls in the past year? No No No No   Depression Screen PHQ 2/9 Scores 04/10/2017 04/07/2016 01/22/2015 12/15/2013  PHQ - 2 Score 0 0 0 3  PHQ- 9 Score 6 - - 12     Cognitive Function MMSE - Mini Mental State Exam 04/10/2017  Orientation to time 5  Orientation to Place 5  Registration 3  Attention/ Calculation 0  Recall 1  pt was unable to recall 2 of 3 words  Language- name 2 objects 0  Language- repeat 1  Language- follow 3 step command 3  Language- read & follow direction 0  Write a sentence 0  Copy design 0  Total score 18  PLEASE NOTE: A Mini-Cog screen was completed. Maximum score is 20. A value of 0 denotes this part of [place] MMSE was not completed or the patient failed this part of the Mini-Cog screening.   Mini-Cog Screening Orientation to Time - Max 5 pts Orientation to Place - Max 5 pts Registration - Max 3 pts Recall - Max 3 pts Language Repeat - Max 1 pts Language Follow 3 Step Command - Max 3 pts     Immunization History  Administered Date(s) Administered  . Influenza Split 05/09/2011, 06/11/2012  . Influenza Whole 05/20/2010  . Influenza,inj,Quad PF,6+ Mos 07/14/2013, 06/16/2014, 04/30/2015, 05/31/2016  . Pneumococcal Polysaccharide-23 04/18/2011  . Td 04/18/2011  . Zoster 12/29/2013   Screening Tests Health Maintenance  Topic Date Due  . PAP SMEAR  04/17/2017 (Originally 12/15/2016)  . INFLUENZA VACCINE  11/18/2017 (Originally 03/21/2017)  . DTaP/Tdap/Td (1 - Tdap) 04/17/2021 (Originally 04/19/2011)  . MAMMOGRAM  05/31/2017  . HEMOGLOBIN A1C  10/11/2017  . OPHTHALMOLOGY EXAM  12/06/2017  . FOOT EXAM  01/08/2018  . TETANUS/TDAP  04/17/2021  . COLONOSCOPY  01/09/2022  . Hepatitis C Screening  Completed  . HIV Screening  Completed      Plan:     I have personally reviewed and addressed the Medicare Annual Wellness questionnaire and have noted the following in the patient's chart:  A. Medical and social history B. Use of alcohol, tobacco or illicit drugs  C. Current medications and supplements D. Functional ability and status E.  Nutritional status F.  Physical activity G. Advance directives H. List of other physicians I.  Hospitalizations, surgeries, and ER visits in previous 12 months J.  [place] to include hearing, vision, cognitive,  depression L. Referrals and appointments - none  In addition, I have reviewed and discussed with patient certain preventive protocols, quality metrics, and best practice recommendations. A written personalized care plan for preventive services as well as general preventive health recommendations were provided to patient.  See attached scanned questionnaire for additional information.   Signed,   Lindell Noe, MHA, BS, LPN Health Coach

## 2017-04-10 NOTE — Progress Notes (Signed)
Pre visit review using our clinic review tool, if applicable. No additional management support is needed unless otherwise documented below in the visit note. 

## 2017-04-13 ENCOUNTER — Telehealth: Payer: Self-pay

## 2017-04-13 NOTE — Telephone Encounter (Signed)
[employer] home delivery left v/m requesting cb with which Amaryl pt should be taking 2 mg or 4 mg tab. There is a duplication on pts med list at [facility] for 2 mg or 4 mg of Amaryl. Use ref # [id number]. Amaryl 4 mg is on current med list but amaryl 2 mg is on hx med list dated 02/08/17. See pt email note on 02/20/17. I advised Samantha at [facility] that pt should be taking amaryl 4 mg one tab po daily with breakfast and Aldona Bar will d/c the 2mg  rx. FYI to Dr Darnell Level for review.

## 2017-04-16 NOTE — Progress Notes (Signed)
I reviewed health advisor's note, was available for consultation, and agree with documentation and plan.  

## 2017-04-16 NOTE — Telephone Encounter (Signed)
Noted. Agree.

## 2017-04-17 ENCOUNTER — Encounter: Payer: Self-pay | Admitting: Family Medicine

## 2017-04-17 ENCOUNTER — Ambulatory Visit (INDEPENDENT_AMBULATORY_CARE_PROVIDER_SITE_OTHER): Payer: Medicare HMO | Admitting: Family Medicine

## 2017-04-17 VITALS — BP 138/78 | HR 108 | Temp 97.5°F | Ht 64.0 in | Wt 302.8 lb

## 2017-04-17 DIAGNOSIS — D72829 Elevated white blood cell count, unspecified: Secondary | ICD-10-CM

## 2017-04-17 DIAGNOSIS — Z0000 Encounter for general adult medical examination without abnormal findings: Secondary | ICD-10-CM

## 2017-04-17 DIAGNOSIS — E118 Type 2 diabetes mellitus with unspecified complications: Secondary | ICD-10-CM | POA: Diagnosis not present

## 2017-04-17 DIAGNOSIS — H6122 Impacted cerumen, left ear: Secondary | ICD-10-CM

## 2017-04-17 DIAGNOSIS — R69 Illness, unspecified: Secondary | ICD-10-CM | POA: Diagnosis not present

## 2017-04-17 DIAGNOSIS — I10 Essential (primary) hypertension: Secondary | ICD-10-CM

## 2017-04-17 DIAGNOSIS — E039 Hypothyroidism, unspecified: Secondary | ICD-10-CM

## 2017-04-17 DIAGNOSIS — F3178 Bipolar disorder, in full remission, most recent episode mixed: Secondary | ICD-10-CM

## 2017-04-17 DIAGNOSIS — E785 Hyperlipidemia, unspecified: Secondary | ICD-10-CM

## 2017-04-17 DIAGNOSIS — Z7189 Other specified counseling: Secondary | ICD-10-CM

## 2017-04-17 DIAGNOSIS — Z794 Long term (current) use of insulin: Secondary | ICD-10-CM

## 2017-04-17 DIAGNOSIS — R000 Tachycardia, unspecified: Secondary | ICD-10-CM

## 2017-04-17 MED ORDER — CARVEDILOL 3.125 MG PO TABS: 3.1250 mg | ORAL_TABLET | Freq: Two times a day (BID) | ORAL | 3 refills | Status: DC

## 2017-04-17 NOTE — Assessment & Plan Note (Addendum)
TSH elevated, free T4 normal. She continues levothyroxine 119mcg daily with 2 days a week taking 18mcg. If persistently high TSH next evaluation, will increase to 127mcg daily. Denies significant hypothyroid symptoms.

## 2017-04-17 NOTE — Assessment & Plan Note (Signed)
Preventative protocols reviewed and updated unless pt declined. Discussed healthy diet and lifestyle.  

## 2017-04-17 NOTE — Assessment & Plan Note (Addendum)
Successful irrigation performed today. Pt states she can hear more clearly.

## 2017-04-17 NOTE — Assessment & Plan Note (Signed)
Chronic, stable on geodon 40mg  bid. Continue. I encouraged she continue to search for in network psychiatrist.

## 2017-04-17 NOTE — Assessment & Plan Note (Signed)
Chronic, seems to be improving. She had normal SPEP and periph smear 2016.

## 2017-04-17 NOTE — Assessment & Plan Note (Signed)
Chronic, stable. Reviewed diet changes to improve triglyceride levels.

## 2017-04-17 NOTE — Assessment & Plan Note (Signed)
Advanced directives: does not have set in place. Packet provided last visit. Husband would be HCPOA.

## 2017-04-17 NOTE — Progress Notes (Signed)
BP 138/78   Pulse (!) 108   Temp (!) 97.5 F (36.4 C) (Oral)   Ht 5\' 4"  (1.626 m)   Wt (!) 302 lb 12 oz (137.3 kg)   SpO2 97%   BMI 51.97 kg/m    CC: CPE Subjective:    Patient ID: [name], female    DOB: Jan 18, 1951, 66 y.o.   MRN: [id number]  HPI: [name] is a 66 y.o. female presenting on 04/17/2017 for Annual Exam (Medicare pt2)   [name] last week for medicare wellness visit. Note reviewed.  L hearing loss recently noted by patient over last 3 wks despite OTC treatment including peroxide. Ongoing use of advil PM as sleep aide.   Bipolar on geodon - mood has been stable. Has been unable to return to psychiatrist due to cost. Very anxious due to today's visit (pap smear and breast exam).   Received notice that novolin N is not covered. Currently pays $120/3 months.   Preventative: COLONOSCOPY Date: 12/2011 hyperplastic polyp x2,. diverticulosis, rec rpt 10 yrs Mammogram WNL 05/2016. Had rough experience. She does breast exams at home and hasn't noticed any concerns. She requests to space out to every other year. Intermittent intertrigo treated with nystatin.  Pap 2015 WNL.rpt Q3 years. S/p total hysterectomy with bilat oophorectomy but cervix remains (fibroids). Bladder sling placed at that time (2014). Always normal pap smears. Requests today's deferred.  Flu yearly Pneumovax 2012 Td 2012 zostavax - 2015 shingrix - discussed - she and husband are on waiting list for shingles.  Advanced directives: does not have set in place. Packet provided last visit. Husband would be HCPOA.  Seat belt use discussed Sunscreen use discussed. No changing moles.  Quit smoking 2011  Alcohol - none  Caffeine: 2 coffee/am Lives with husband, 1 dog, 2 cats Occupation: disability since 2009 for bipolar, previously worked at [employer] Activity: tries to do arm exercises twice a week.  Diet: not many fruits, good vegetables, good amt water   Relevant past medical, surgical,  family and social history reviewed and updated as indicated. Interim medical history since our last visit reviewed. Allergies and medications reviewed and updated. Outpatient Medications Prior to Visit  Medication Sig Dispense Refill  . aspirin EC 81 MG tablet Take 162 mg by mouth daily.    . famotidine (PEPCID) 20 MG tablet Take 20 mg by mouth daily.    Marland Kitchen glimepiride (AMARYL) 4 MG tablet Take 1 tablet (4 mg total) by mouth daily with breakfast. 90 tablet 1  . glucose blood test strip Use as instructed 200 each 3  . hydrochlorothiazide (HYDRODIURIL) 25 MG tablet Take 1 tablet (25 mg total) by mouth daily. 90 tablet 3  . INS SYRINGE/NEEDLE 1CC/28G 28G X 1/2" 1 ML MISC Use as directed 100 each 3  . levothyroxine (SYNTHROID, LEVOTHROID) 100 MCG tablet Take 1 tablet (100 mcg total) by mouth daily. Two days a week take extra 1/2 tablet daily 90 tablet 2  . lisinopril (PRINIVIL,ZESTRIL) 5 MG tablet TAKE 1 TABLET EVERY DAY 90 tablet 2  . metFORMIN (GLUCOPHAGE) 500 MG tablet TAKE 2 TABLETS IN THE MORNING AND 2 TABLETS AT NIGHT 360 tablet 3  . Multiple Vitamin (MULTIVITAMIN) tablet Take 1 tablet by mouth daily.    . Naproxen Sodium 220 MG CAPS Take 440 mg by mouth 2 (two) times daily.    Marland Kitchen NOVOLIN N 100 UNIT/ML injection INJECT 55 UNITS  SUBCUTANEOUSLY AT BEDTIME 50 mL 2  . nystatin cream (MYCOSTATIN) Apply topically 2 (two) times daily. 30 g 1  . ONE TOUCH LANCETS MISC Use to check sugar twice daily Dx: E11.8 200 each 3  . pioglitazone (ACTOS) 15 MG tablet TAKE 1 TABLET DAILY 90 tablet 1  . simvastatin (ZOCOR) 20 MG tablet Take 1 tablet (20 mg total) by mouth at bedtime. 90 tablet 1  . ziprasidone (GEODON) 40 MG capsule TAKE 1 CAPSULE TWICE DAILY WITH MEALS 180 capsule 1  . carvedilol (COREG) 3.125 MG tablet Take 1 tablet (3.125 mg total) by mouth 2 (two) times daily with a meal. 60 tablet 3   No facility-administered medications prior to visit.      Per HPI unless specifically indicated  in ROS section below Review of Systems  Constitutional: Negative for activity change, appetite change, chills, fatigue, fever and unexpected weight change.  HENT: Negative for hearing loss.   Eyes: Negative for visual disturbance.  Respiratory: Positive for shortness of breath. Negative for cough, chest tightness and wheezing.   Cardiovascular: Positive for chest pain and leg swelling (R>L). Negative for palpitations.  Gastrointestinal: Positive for diarrhea. Negative for abdominal distention, abdominal pain, blood in stool, constipation, nausea and vomiting.  Genitourinary: Negative for difficulty urinating and hematuria.  Musculoskeletal: Negative for arthralgias, myalgias and neck pain.  Skin: Negative for rash.  Neurological: Positive for headaches (intermittent, treated with excedrin migraine). Negative for dizziness, seizures and syncope.  Hematological: Negative for adenopathy. Does not bruise/bleed easily.  Psychiatric/Behavioral: Negative for dysphoric mood. The patient is not nervous/anxious.        Overall stable       Objective:    BP 138/78   Pulse (!) 108   Temp (!) 97.5 F (36.4 C) (Oral)   Ht 5\' 4"  (1.626 m)   Wt (!) 302 lb 12 oz (137.3 kg)   SpO2 97%   BMI 51.97 kg/m   Wt Readings from Last 3 Encounters:  04/17/17 (!) 302 lb 12 oz (137.3 kg)  04/10/17 300 lb (136.1 kg)  01/08/17 (!) 307 lb (139.3 kg)    Physical Exam  Constitutional: She is oriented to person, place, and time. She appears well-developed and well-nourished. No distress.  HENT:  Head: Normocephalic and atraumatic.  Right Ear: Hearing, tympanic membrane, external ear and ear canal normal.  Left Ear: Hearing, external ear and ear canal normal.  Nose: Nose normal.  Mouth/Throat: Uvula is midline, oropharynx is clear and moist and mucous membranes are normal. No oropharyngeal exudate, posterior oropharyngeal edema or posterior oropharyngeal erythema.  Cerumen covering R TM After irrigation able  to see macerated TM  Eyes: Pupils are equal, round, and reactive to light. Conjunctivae and EOM are normal. No scleral icterus.  Neck: Normal range of motion. Neck supple. No thyromegaly present.  Cardiovascular: Normal rate, regular rhythm, normal heart sounds and intact distal pulses.   No murmur heard. Pulses:      Radial pulses are 2+ on the right side, and 2+ on the left side.  Pulmonary/Chest: Effort normal and breath sounds normal. No respiratory distress. She has no wheezes. She has no rales.  Abdominal: Soft. Bowel sounds are normal. She exhibits no distension and no mass. There is no tenderness. There is no rebound and no guarding.  Musculoskeletal: Normal range of motion. She exhibits no edema.  Lymphadenopathy:    She has no cervical adenopathy.  Neurological: She is alert and oriented to person, place, and time.  CN  grossly intact, station and gait intact  Skin: Skin is warm and dry. No rash noted.  Psychiatric: She has a normal mood and affect. Her behavior is normal. Judgment and thought content normal.  Nursing note and vitals reviewed.  Results for orders placed or performed in visit on 04/10/17  Hemoglobin A1c  Result Value Ref Range   Hgb A1c MFr Bld 6.7 (H) 4.6 - 6.5 %  Comprehensive metabolic panel  Result Value Ref Range   Sodium 139 135 - 145 mEq/L   Potassium 4.3 3.5 - 5.1 mEq/L   Chloride 99 96 - 112 mEq/L   CO2 32 19 - 32 mEq/L   Glucose, Bld 96 70 - 99 mg/dL   BUN 15 6 - 23 mg/dL   Creatinine, Ser 0.85 0.40 - 1.20 mg/dL   Total Bilirubin 0.3 0.2 - 1.2 mg/dL   Alkaline Phosphatase 49 39 - 117 U/L   AST 16 0 - 37 U/L   ALT 18 0 - 35 U/L   Total Protein 8.0 6.0 - 8.3 g/dL   Albumin 3.9 3.5 - 5.2 g/dL   Calcium 9.8 8.4 - 10.5 mg/dL   GFR 71.44 >60.00 mL/min  TSH  Result Value Ref Range   TSH 4.88 (H) 0.35 - 4.50 uIU/mL  CBC with Differential/Platelet  Result Value Ref Range   WBC 12.6 (H) 4.0 - 10.5 K/uL   RBC 4.26 3.87 - 5.11 Mil/uL   Hemoglobin  12.5 12.0 - 15.0 g/dL   HCT 38.8 36.0 - 46.0 %   MCV 90.9 78.0 - 100.0 fl   MCHC 32.3 30.0 - 36.0 g/dL   RDW 15.6 (H) 11.5 - 15.5 %   Platelets 375.0 150.0 - 400.0 K/uL   Neutrophils Relative % 76.0 43.0 - 77.0 %   Lymphocytes Relative 16.7 12.0 - 46.0 %   Monocytes Relative 5.5 3.0 - 12.0 %   Eosinophils Relative 1.1 0.0 - 5.0 %   Basophils Relative 0.7 0.0 - 3.0 %   Neutro Abs 9.5 (H) 1.4 - 7.7 K/uL   Lymphs Abs 2.1 0.7 - 4.0 K/uL   Monocytes Absolute 0.7 0.1 - 1.0 K/uL   Eosinophils Absolute 0.1 0.0 - 0.7 K/uL   Basophils Absolute 0.1 0.0 - 0.1 K/uL  T4, Free  Result Value Ref Range   Free T4 0.94 0.60 - 1.60 ng/dL  Lipid Panel  Result Value Ref Range   Cholesterol 119 0 - 200 mg/dL   Triglycerides 242.0 (H) 0.0 - 149.0 mg/dL   HDL 36.60 (L) >39.00 mg/dL   VLDL 48.4 (H) 0.0 - 40.0 mg/dL   Total CHOL/HDL Ratio 3    NonHDL 82.35   LDL cholesterol, direct  Result Value Ref Range   Direct LDL 62.0 mg/dL      Assessment & Plan:   Problem List Items Addressed This Visit    Advanced care planning/counseling discussion    Advanced directives: does not have set in place. Packet provided last visit. Husband would be HCPOA.       Bipolar disorder (HCC)    Chronic, stable on geodon 40mg  bid. Continue. I encouraged she continue to search for in network psychiatrist.      Diabetes mellitus type 2, controlled, with complications ([facility])    Chronic, stable based on A1c.  Continue current regimen.  She will check with insurance about covered alternatives to novolin.       Health maintenance examination - Primary    Preventative protocols reviewed and updated unless pt  declined. Discussed healthy diet and lifestyle.       Hearing loss due to cerumen impaction, left    Successful irrigation performed today. Pt states she can hear more clearly.       HLD (hyperlipidemia)    Chronic, stable. Reviewed diet changes to improve triglyceride levels.       Relevant Medications    carvedilol (COREG) 3.125 MG tablet   Hypertension    Chronic, stable. Continue current regimen.       Relevant Medications   carvedilol (COREG) 3.125 MG tablet   Hypothyroidism    TSH elevated, free T4 normal. She continues levothyroxine 133mcg daily with 2 days a week taking 48mcg. If persistently high TSH next evaluation, will increase to 111mcg daily. Denies significant hypothyroid symptoms.       Relevant Medications   carvedilol (COREG) 3.125 MG tablet   Leukocytosis    Chronic, seems to be improving. She had normal SPEP and periph smear 2016.       Tachycardia    Pt tolerating beta blocker switch well. Requests 3 mo supply sent to pharmacy. Mildly tachycardic today but she was also very anxious about well woman component of exam today.           Follow up plan: Return in about 4 months (around 08/17/2017) for follow up visit.  Ria Bush, MD

## 2017-04-17 NOTE — Assessment & Plan Note (Signed)
Pt tolerating beta blocker switch well. Requests 3 mo supply sent to pharmacy. Mildly tachycardic today but she was also very anxious about well woman component of exam today.

## 2017-04-17 NOTE — Assessment & Plan Note (Addendum)
Chronic, stable based on A1c.  Continue current regimen.  She will check with insurance about covered alternatives to novolin.

## 2017-04-17 NOTE — Patient Instructions (Addendum)
We will space out mammogram to every 2 years.  Final pap smear will be due next year.  Increase seated exercises to 20 minutes daily.  Check on in-network psychiatrist.  Left ear irrigation performed today.  Return as needed or in 4 months for follow up visit.   Health Maintenance, Female Adopting a healthy lifestyle and getting preventive care can go a long way to promote health and wellness. Talk with your health care provider about what schedule of regular examinations is right for you. This is a good chance for you to check in with your provider about disease prevention and staying healthy. In between checkups, there are plenty of things you can do on your own. Experts have done a lot of research about which lifestyle changes and preventive measures are most likely to keep you healthy. Ask your health care provider for more information. Weight and diet Eat a healthy diet  Be sure to include plenty of vegetables, fruits, low-fat dairy products, and lean protein.  Do not eat a lot of foods high in solid fats, added sugars, or salt.  Get regular exercise. This is one of the most important things you can do for your health. ? Most adults should exercise for at least 150 minutes each week. The exercise should increase your heart rate and make you sweat (moderate-intensity exercise). ? Most adults should also do strengthening exercises at least twice a week. This is in addition to the moderate-intensity exercise.  Maintain a healthy weight  Body mass index (BMI) is a measurement that can be used to identify possible weight problems. It estimates body fat based on height and weight. Your health care provider can help determine your BMI and help you achieve or maintain a healthy weight.  For females 63 years of age and older: ? A BMI below 18.5 is considered underweight. ? A BMI of 18.5 to 24.9 is normal. ? A BMI of 25 to 29.9 is considered overweight. ? A BMI of 30 and above is considered  obese.  Watch levels of cholesterol and blood lipids  You should start having your blood tested for lipids and cholesterol at 64 years of age, then have this test every 5 years.  You may need to have your cholesterol levels checked more often if: ? Your lipid or cholesterol levels are high. ? You are older than 64 years of age. ? You are at high risk for heart disease.  Cancer screening Lung Cancer  Lung cancer screening is recommended for adults 19-58 years old who are at high risk for lung cancer because of a history of smoking.  A yearly low-dose CT scan of the lungs is recommended for people who: ? Currently smoke. ? Have quit within the past 15 years. ? Have at least a 30-pack-year history of smoking. A pack year is smoking an average of one pack of cigarettes a day for 1 year.  Yearly screening should continue until it has been 15 years since you quit.  Yearly screening should stop if you develop a health problem that would prevent you from having lung cancer treatment.  Breast Cancer  Practice breast self-awareness. This means understanding how your breasts normally appear and feel.  It also means doing regular breast self-exams. Let your health care provider know about any changes, no matter how small.  If you are in your 20s or 30s, you should have a clinical breast exam (CBE) by a health care provider every 1-3 years as part of  a regular health exam.  If you are 19 or older, have a CBE every year. Also consider having a breast X-ray (mammogram) every year.  If you have a family history of breast cancer, talk to your health care provider about genetic screening.  If you are at high risk for breast cancer, talk to your health care provider about having an MRI and a mammogram every year.  Breast cancer gene (BRCA) assessment is recommended for women who have family members with BRCA-related cancers. BRCA-related cancers  include: ? Breast. ? Ovarian. ? Tubal. ? Peritoneal cancers.  Results of the assessment will determine the need for genetic counseling and BRCA1 and BRCA2 testing.  Cervical Cancer Your health care provider may recommend that you be screened regularly for cancer of the pelvic organs (ovaries, uterus, and vagina). This screening involves a pelvic examination, including checking for microscopic changes to the surface of your cervix (Pap test). You may be encouraged to have this screening done every 3 years, beginning at age 34.  For women ages 4-65, health care providers may recommend pelvic exams and Pap testing every 3 years, or they may recommend the Pap and pelvic exam, combined with testing for human papilloma virus (HPV), every 5 years. Some types of HPV increase your risk of cervical cancer. Testing for HPV may also be done on women of any age with unclear Pap test results.  Other health care providers may not recommend any screening for nonpregnant women who are considered low risk for pelvic cancer and who do not have symptoms. Ask your health care provider if a screening pelvic exam is right for you.  If you have had past treatment for cervical cancer or a condition that could lead to cancer, you need Pap tests and screening for cancer for at least 20 years after your treatment. If Pap tests have been discontinued, your risk factors (such as having a new sexual partner) need to be reassessed to determine if screening should resume. Some women have medical problems that increase the chance of getting cervical cancer. In these cases, your health care provider may recommend more frequent screening and Pap tests.  Colorectal Cancer  This type of cancer can be detected and often prevented.  Routine colorectal cancer screening usually begins at 64 years of age and continues through 64 years of age.  Your health care provider may recommend screening at an earlier age if you have risk factors  for colon cancer.  Your health care provider may also recommend using home test kits to check for hidden blood in the stool.  A small camera at the end of a tube can be used to examine your colon directly (sigmoidoscopy or colonoscopy). This is done to check for the earliest forms of colorectal cancer.  Routine screening usually begins at age 37.  Direct examination of the colon should be repeated every 5-10 years through 64 years of age. However, you may need to be screened more often if early forms of precancerous polyps or small growths are found.  Skin Cancer  Check your skin from head to toe regularly.  Tell your health care provider about any new moles or changes in moles, especially if there is a change in a mole's shape or color.  Also tell your health care provider if you have a mole that is larger than the size of a pencil eraser.  Always use sunscreen. Apply sunscreen liberally and repeatedly throughout the day.  Protect yourself by wearing long sleeves,  pants, a wide-brimmed hat, and sunglasses whenever you are outside.  Heart disease, diabetes, and high blood pressure  High blood pressure causes heart disease and increases the risk of stroke. High blood pressure is more likely to develop in: ? People who have blood pressure in the high end of the normal range (130-139/85-89 mm Hg). ? People who are overweight or obese. ? People who are African American.  If you are 18-39 years of age, have your blood pressure checked every 3-5 years. If you are 40 years of age or older, have your blood pressure checked every year. You should have your blood pressure measured twice-once when you are at a hospital or clinic, and once when you are not at a hospital or clinic. Record the average of the two measurements. To check your blood pressure when you are not at a hospital or clinic, you can use: ? An automated blood pressure machine at a pharmacy. ? A home blood pressure monitor.  If  you are between 55 years and 79 years old, ask your health care provider if you should take aspirin to prevent strokes.  Have regular diabetes screenings. This involves taking a blood sample to check your fasting blood sugar level. ? If you are at a normal weight and have a low risk for diabetes, have this test once every three years after 64 years of age. ? If you are overweight and have a high risk for diabetes, consider being tested at a younger age or more often. Preventing infection Hepatitis B  If you have a higher risk for hepatitis B, you should be screened for this virus. You are considered at high risk for hepatitis B if: ? You were born in a country where hepatitis B is common. Ask your health care provider which countries are considered high risk. ? Your parents were born in a high-risk country, and you have not been immunized against hepatitis B (hepatitis B vaccine). ? You have HIV or AIDS. ? You use needles to inject street drugs. ? You live with someone who has hepatitis B. ? You have had sex with someone who has hepatitis B. ? You get hemodialysis treatment. ? You take certain medicines for conditions, including cancer, organ transplantation, and autoimmune conditions.  Hepatitis C  Blood testing is recommended for: ? Everyone born from 1945 through 1965. ? Anyone with known risk factors for hepatitis C.  Sexually transmitted infections (STIs)  You should be screened for sexually transmitted infections (STIs) including gonorrhea and chlamydia if: ? You are sexually active and are younger than 64 years of age. ? You are older than 64 years of age and your health care provider tells you that you are at risk for this type of infection. ? Your sexual activity has changed since you were last screened and you are at an increased risk for chlamydia or gonorrhea. Ask your health care provider if you are at risk.  If you do not have HIV, but are at risk, it may be recommended  that you take a prescription medicine daily to prevent HIV infection. This is called pre-exposure prophylaxis (PrEP). You are considered at risk if: ? You are sexually active and do not regularly use condoms or know the HIV status of your partner(s). ? You take drugs by injection. ? You are sexually active with a partner who has HIV.  Talk with your health care provider about whether you are at high risk of being infected with HIV. If you   choose to begin PrEP, you should first be tested for HIV. You should then be tested every 3 months for as long as you are taking PrEP. Pregnancy  If you are premenopausal and you may become pregnant, ask your health care provider about preconception counseling.  If you may become pregnant, take 400 to 800 micrograms (mcg) of folic acid every day.  If you want to prevent pregnancy, talk to your health care provider about birth control (contraception). Osteoporosis and menopause  Osteoporosis is a disease in which the bones lose minerals and strength with aging. This can result in serious bone fractures. Your risk for osteoporosis can be identified using a bone density scan.  If you are 16 years of age or older, or if you are at risk for osteoporosis and fractures, ask your health care provider if you should be screened.  Ask your health care provider whether you should take a calcium or vitamin D supplement to lower your risk for osteoporosis.  Menopause may have certain physical symptoms and risks.  Hormone replacement therapy may reduce some of these symptoms and risks. Talk to your health care provider about whether hormone replacement therapy is right for you. Follow these instructions at home:  Schedule regular health, dental, and eye exams.  Stay current with your immunizations.  Do not use any tobacco products including cigarettes, chewing tobacco, or electronic cigarettes.  If you are pregnant, do not drink alcohol.  If you are  breastfeeding, limit how much and how often you drink alcohol.  Limit alcohol intake to no more than 1 drink per day for nonpregnant women. One drink equals 12 ounces of beer, 5 ounces of wine, or 1 ounces of hard liquor.  Do not use street drugs.  Do not share needles.  Ask your health care provider for help if you need support or information about quitting drugs.  Tell your health care provider if you often feel depressed.  Tell your health care provider if you have ever been abused or do not feel safe at home. This information is not intended to replace advice given to you by your health care provider. Make sure you discuss any questions you have with your health care provider. Document Released: 02/20/2011 Document Revised: 01/13/2016 Document Reviewed: 05/11/2015 Elsevier Interactive Patient Education  [employer].

## 2017-04-17 NOTE — Assessment & Plan Note (Signed)
Chronic, stable. Continue current regimen. 

## 2017-04-18 ENCOUNTER — Other Ambulatory Visit: Payer: Self-pay | Admitting: Family Medicine

## 2017-04-27 ENCOUNTER — Other Ambulatory Visit: Payer: Self-pay | Admitting: Family Medicine

## 2017-05-10 ENCOUNTER — Ambulatory Visit (INDEPENDENT_AMBULATORY_CARE_PROVIDER_SITE_OTHER): Payer: Medicare HMO

## 2017-05-10 DIAGNOSIS — Z23 Encounter for immunization: Secondary | ICD-10-CM | POA: Diagnosis not present

## 2017-05-21 ENCOUNTER — Encounter: Payer: Self-pay | Admitting: Family Medicine

## 2017-05-27 MED ORDER — INSULIN GLARGINE 100 UNIT/ML SOLOSTAR PEN: 45.0000 [IU] | PEN_INJECTOR | Freq: Every day | SUBCUTANEOUS | 11 refills | Status: DC

## 2017-05-31 MED ORDER — INSULIN GLARGINE 100 UNIT/ML ~~LOC~~ SOLN: 45.0000 [IU] | Freq: Every day | SUBCUTANEOUS | 3 refills | Status: DC

## 2017-05-31 NOTE — Addendum Note (Signed)
Addended by: Ria Bush on: 05/31/2017 11:01 AM   Modules accepted: Orders

## 2017-06-13 ENCOUNTER — Encounter: Payer: Self-pay | Admitting: Family Medicine

## 2017-06-19 DIAGNOSIS — R69 Illness, unspecified: Secondary | ICD-10-CM | POA: Diagnosis not present

## 2017-07-08 ENCOUNTER — Other Ambulatory Visit: Payer: Self-pay | Admitting: Family Medicine

## 2017-08-15 ENCOUNTER — Other Ambulatory Visit: Payer: Self-pay | Admitting: Family Medicine

## 2017-08-15 MED ORDER — ZIPRASIDONE HCL 40 MG PO CAPS: ORAL_CAPSULE | ORAL | 0 refills | Status: DC

## 2017-08-15 NOTE — Telephone Encounter (Signed)
Copied from [place] [id number]. Topic: Quick Communication - See Telephone Encounter >> Aug 15, 2017  4:01 PM Ivar Drape wrote: CRM for notification. See Telephone encounter for:  08/15/17. Patient said her mail pharmacy is still processing her medication for Ziprasidone but she is going to be out of it 08/17/17, Friday morning.  The mail pharmacy suggested she ask if Dr. Danise Mina can send her in a prescription for a one weeks supply and send it [facility] in [place].

## 2017-08-15 NOTE — Telephone Encounter (Signed)
plz notify I've sent in 2 wk supply to local [facility]

## 2017-08-15 NOTE — Telephone Encounter (Signed)
Ziprasidone last refilled # 180 x 1. Pt last annual 04/17/17.Please advise. [facility].

## 2017-08-16 NOTE — Telephone Encounter (Signed)
Spoke with pt notifying her 2 wk rx was sent to [place].  Says ok and expresses her thanks.

## 2017-08-17 ENCOUNTER — Ambulatory Visit (INDEPENDENT_AMBULATORY_CARE_PROVIDER_SITE_OTHER): Payer: Medicare HMO | Admitting: Family Medicine

## 2017-08-17 ENCOUNTER — Encounter: Payer: Self-pay | Admitting: Family Medicine

## 2017-08-17 VITALS — BP 130/68 | HR 103 | Temp 98.0°F | Wt 299.0 lb

## 2017-08-17 DIAGNOSIS — E039 Hypothyroidism, unspecified: Secondary | ICD-10-CM | POA: Diagnosis not present

## 2017-08-17 DIAGNOSIS — F3178 Bipolar disorder, in full remission, most recent episode mixed: Secondary | ICD-10-CM | POA: Diagnosis not present

## 2017-08-17 DIAGNOSIS — I10 Essential (primary) hypertension: Secondary | ICD-10-CM | POA: Diagnosis not present

## 2017-08-17 DIAGNOSIS — Z794 Long term (current) use of insulin: Secondary | ICD-10-CM

## 2017-08-17 DIAGNOSIS — E118 Type 2 diabetes mellitus with unspecified complications: Secondary | ICD-10-CM | POA: Diagnosis not present

## 2017-08-17 DIAGNOSIS — Z6841 Body Mass Index (BMI) 40.0 and over, adult: Secondary | ICD-10-CM

## 2017-08-17 DIAGNOSIS — R69 Illness, unspecified: Secondary | ICD-10-CM | POA: Diagnosis not present

## 2017-08-17 MED ORDER — LISINOPRIL 5 MG PO TABS: 5.0000 mg | ORAL_TABLET | Freq: Every day | ORAL | 3 refills | Status: DC

## 2017-08-17 NOTE — Assessment & Plan Note (Signed)
Chronic, stable on geodon. Insurance did not cover Rx when she ran out so she spaced out to QD dosing. Just received new dose.

## 2017-08-17 NOTE — Assessment & Plan Note (Addendum)
Encourage healthy diet changes including smaller portion sizes. Goal weight loss of 10 lbs over next 4 months.

## 2017-08-17 NOTE — Assessment & Plan Note (Signed)
Chronic. We previously increased levothyroxine to 111mcg daily with 2 d a week taking 179mcg. Update TSH today

## 2017-08-17 NOTE — Progress Notes (Signed)
BP 130/68 (BP Location: Left Arm, Patient Position: Sitting, Cuff Size: Large)   Pulse (!) 103   Temp 98 F (36.7 C) (Oral)   Wt 299 lb (135.6 kg)   SpO2 96%   BMI 51.32 kg/m    CC: 4 mo /fu visit Subjective:    Patient ID: [name], female    DOB: Feb 11, 1951, 66 y.o.   MRN: [id number]  HPI: MADELAINE [name] is a 66 y.o. female presenting on 08/17/2017 for 4 mo follow-up   Bipolar on geodon - trouble getting in to psych due to cost. We have been filling antipsychotic. She had to space out meds until she got new refill - insurance would not cover local 2 wk course.   Due for thyroid check. She is compliant with levothyroxine 187mcg daily, 2 days a week taking 1/2 extra tablet.  DM - does regularly check sugars but forgot log. Compliant with antihyperglycemic regimen which includes: amaryl 4mg  daily, metformin 1000mg  bid, lantus (solution) 55u daily. Denies low sugars or hypoglycemic symptoms. Denies paresthesias. Last diabetic eye exam 11/2016. Pneumovax: 2012. Prevnar: not due. Glucometer brand: one-touch ultra. DSME: completed at [facility] several years ago. Insurance no longer covering lantus - will let us know when she runs out to send in Novolin N (prior dose was 55u daily). Lab Results  Component Value Date   HGBA1C 6.7 (H) 04/10/2017   Diabetic Foot Exam - Simple   No data filed     Lab Results  Component Value Date   MICROALBUR 5.8 (H) 01/15/2015     Relevant past medical, surgical, family and social history reviewed and updated as indicated. Interim medical history since our last visit reviewed. Allergies and medications reviewed and updated. Outpatient Medications Prior to Visit  Medication Sig Dispense Refill  . aspirin EC 81 MG tablet Take 162 mg by mouth daily.    . carvedilol (COREG) 3.125 MG tablet Take 1 tablet (3.125 mg total) by mouth 2 (two) times daily with a meal. 180 tablet 3  . famotidine (PEPCID) 20 MG tablet Take 20 mg by mouth daily.    Marland Kitchen  glimepiride (AMARYL) 4 MG tablet TAKE 1 TABLET DAILY WITH   BREAKFAST 90 tablet 0  . glucose blood test strip Use as instructed 200 each 3  . hydrochlorothiazide (HYDRODIURIL) 25 MG tablet Take 1 tablet (25 mg total) by mouth daily. 90 tablet 3  . INS SYRINGE/NEEDLE 1CC/28G 28G X 1/2" 1 ML MISC Use as directed 100 each 3  . insulin glargine (LANTUS) 100 UNIT/ML injection Inject 0.45 mLs (45 Units total) into the skin at bedtime. 50 mL 3  . levothyroxine (SYNTHROID, LEVOTHROID) 100 MCG tablet Take 1 tablet (100 mcg total) by mouth daily. Two days a week take extra 1/2 tablet daily 90 tablet 2  . metFORMIN (GLUCOPHAGE) 500 MG tablet TAKE 2 TABLETS IN THE MORNING AND 2 TABLETS AT NIGHT 360 tablet 3  . Multiple Vitamin (MULTIVITAMIN) tablet Take 1 tablet by mouth daily.    . Naproxen Sodium 220 MG CAPS Take 440 mg by mouth 2 (two) times daily.    Marland Kitchen nystatin cream (MYCOSTATIN) APPLY TO AFFECTED AREA TWICE A DAY 30 g 1  . ONE TOUCH LANCETS MISC Use to check sugar twice daily Dx: E11.8 200 each 3  . pioglitazone (ACTOS) 15 MG tablet TAKE 1 TABLET DAILY 90 tablet 1  . simvastatin (ZOCOR) 20 MG tablet TAKE 1 TABLET AT BEDTIME 90 tablet 1  . ziprasidone (GEODON) 40  MG capsule TAKE 1 CAPSULE TWICE DAILY WITH MEALS 30 capsule 0  . lisinopril (PRINIVIL,ZESTRIL) 5 MG tablet TAKE 1 TABLET EVERY DAY 90 tablet 2   No facility-administered medications prior to visit.      Per HPI unless specifically indicated in ROS section below Review of Systems     Objective:    BP 130/68 (BP Location: Left Arm, Patient Position: Sitting, Cuff Size: Large)   Pulse (!) 103   Temp 98 F (36.7 C) (Oral)   Wt 299 lb (135.6 kg)   SpO2 96%   BMI 51.32 kg/m   Wt Readings from Last 3 Encounters:  08/17/17 299 lb (135.6 kg)  04/17/17 (!) 302 lb 12 oz (137.3 kg)  04/10/17 300 lb (136.1 kg)    Physical Exam  Constitutional: She appears well-developed and well-nourished. No distress.  HENT:  Mouth/Throat: Oropharynx is  clear and moist. No oropharyngeal exudate.  Eyes: Conjunctivae and EOM are normal. Pupils are equal, round, and reactive to light.  Cardiovascular: Normal rate, regular rhythm, normal heart sounds and intact distal pulses.  No murmur heard. Pulmonary/Chest: Effort normal and breath sounds normal. No respiratory distress. She has no wheezes. She has no rales.  Musculoskeletal: She exhibits no edema.  Skin: Skin is warm and dry. No rash noted.  Psychiatric: She has a normal mood and affect.  Nursing note and vitals reviewed.  Results for orders placed or performed in visit on 04/10/17  Hemoglobin A1c  Result Value Ref Range   Hgb A1c MFr Bld 6.7 (H) 4.6 - 6.5 %  Comprehensive metabolic panel  Result Value Ref Range   Sodium 139 135 - 145 mEq/L   Potassium 4.3 3.5 - 5.1 mEq/L   Chloride 99 96 - 112 mEq/L   CO2 32 19 - 32 mEq/L   Glucose, Bld 96 70 - 99 mg/dL   BUN 15 6 - 23 mg/dL   Creatinine, Ser 0.85 0.40 - 1.20 mg/dL   Total Bilirubin 0.3 0.2 - 1.2 mg/dL   Alkaline Phosphatase 49 39 - 117 U/L   AST 16 0 - 37 U/L   ALT 18 0 - 35 U/L   Total Protein 8.0 6.0 - 8.3 g/dL   Albumin 3.9 3.5 - 5.2 g/dL   Calcium 9.8 8.4 - 10.5 mg/dL   GFR 71.44 >60.00 mL/min  TSH  Result Value Ref Range   TSH 4.88 (H) 0.35 - 4.50 uIU/mL  CBC with Differential/Platelet  Result Value Ref Range   WBC 12.6 (H) 4.0 - 10.5 K/uL   RBC 4.26 3.87 - 5.11 Mil/uL   Hemoglobin 12.5 12.0 - 15.0 g/dL   HCT 38.8 36.0 - 46.0 %   MCV 90.9 78.0 - 100.0 fl   MCHC 32.3 30.0 - 36.0 g/dL   RDW 15.6 (H) 11.5 - 15.5 %   Platelets 375.0 150.0 - 400.0 K/uL   Neutrophils Relative % 76.0 43.0 - 77.0 %   Lymphocytes Relative 16.7 12.0 - 46.0 %   Monocytes Relative 5.5 3.0 - 12.0 %   Eosinophils Relative 1.1 0.0 - 5.0 %   Basophils Relative 0.7 0.0 - 3.0 %   Neutro Abs 9.5 (H) 1.4 - 7.7 K/uL   Lymphs Abs 2.1 0.7 - 4.0 K/uL   Monocytes Absolute 0.7 0.1 - 1.0 K/uL   Eosinophils Absolute 0.1 0.0 - 0.7 K/uL   Basophils  Absolute 0.1 0.0 - 0.1 K/uL  T4, Free  Result Value Ref Range   Free T4 0.94 0.60 -  1.60 ng/dL  Lipid Panel  Result Value Ref Range   Cholesterol 119 0 - 200 mg/dL   Triglycerides 242.0 (H) 0.0 - 149.0 mg/dL   HDL 36.60 (L) >39.00 mg/dL   VLDL 48.4 (H) 0.0 - 40.0 mg/dL   Total CHOL/HDL Ratio 3    NonHDL 82.35   LDL cholesterol, direct  Result Value Ref Range   Direct LDL 62.0 mg/dL      Assessment & Plan:   Problem List Items Addressed This Visit    Bipolar disorder ([place])    Chronic, stable on geodon. Insurance did not cover Rx when she ran out so she spaced out to QD dosing. Just received new dose.       Diabetes mellitus type 2, controlled, with complications ([place]) - Primary    Chronic, pt doesn't feel as good control since switch to lantus. Continue current regimen, update A1c. Will likely need to return to novolin N in 2019 per insurance formulary change again      Relevant Medications   lisinopril (PRINIVIL,ZESTRIL) 5 MG tablet   Other Relevant Orders   Hemoglobin A1c   Hypertension    Chronic, stable. Continue current regimen.       Relevant Medications   lisinopril (PRINIVIL,ZESTRIL) 5 MG tablet   Hypothyroidism    Chronic. We previously increased levothyroxine to 164mcg daily with 2 d a week taking 138mcg. Update TSH today       Relevant Orders   TSH   Obesity, morbid, BMI 50 or higher (HCC)    Encourage healthy diet changes including smaller portion sizes. Goal weight loss of 10 lbs over next 4 months.          Follow up plan: Return in about 4 months (around 12/16/2017) for follow up visit.  Ria Bush, MD

## 2017-08-17 NOTE — Assessment & Plan Note (Signed)
Chronic, stable. Continue current regimen. 

## 2017-08-17 NOTE — Patient Instructions (Addendum)
Labs today Continue current medicines.  Return as needed or in 4 months for follow up visit.  Work on 10 lb weight loss over the next 4 months.

## 2017-08-17 NOTE — Assessment & Plan Note (Signed)
Chronic, pt doesn't feel as good control since switch to lantus. Continue current regimen, update A1c. Will likely need to return to novolin N in 2019 per insurance formulary change again

## 2017-08-18 LAB — HEMOGLOBIN A1C
Hgb A1c MFr Bld: 6.5 % of total Hgb — ABNORMAL HIGH (ref <5.7)
Mean Plasma Glucose: 140 (calc)
eAG (mmol/L): 7.7 (calc)

## 2017-08-18 LAB — TSH: TSH: 3.01 m[IU]/L (ref 0.40–4.50)

## 2017-08-20 ENCOUNTER — Encounter: Payer: Self-pay | Admitting: Family Medicine

## 2017-08-20 ENCOUNTER — Other Ambulatory Visit: Payer: Self-pay | Admitting: Family Medicine

## 2017-08-23 ENCOUNTER — Encounter: Payer: Self-pay | Admitting: Family Medicine

## 2017-09-04 ENCOUNTER — Telehealth: Payer: Self-pay

## 2017-09-04 ENCOUNTER — Encounter: Payer: Self-pay | Admitting: Family Medicine

## 2017-09-05 MED ORDER — ZIPRASIDONE HCL 40 MG PO CAPS: ORAL_CAPSULE | ORAL | 1 refills | Status: DC

## 2017-09-05 NOTE — Telephone Encounter (Signed)
Rx sent to [facility] per pt request.

## 2017-09-11 ENCOUNTER — Encounter: Payer: Self-pay | Admitting: Family Medicine

## 2017-09-22 ENCOUNTER — Other Ambulatory Visit: Payer: Self-pay | Admitting: Family Medicine

## 2017-09-25 NOTE — Telephone Encounter (Signed)
Last refill: #90, 1 refill on 03/23/17 Last OV: 08/17/18 Last Annual Exam: 03/2017  Patient is due for follow up on diabetes and obesity on 12/17/17.  Will refill for #90, 1 refill.  Okay per protocol.

## 2017-10-09 ENCOUNTER — Other Ambulatory Visit: Payer: Self-pay | Admitting: Family Medicine

## 2017-10-18 ENCOUNTER — Other Ambulatory Visit: Payer: Self-pay | Admitting: Family Medicine

## 2017-10-31 ENCOUNTER — Other Ambulatory Visit: Payer: Self-pay | Admitting: Family Medicine

## 2017-11-14 ENCOUNTER — Encounter: Payer: Self-pay | Admitting: Family Medicine

## 2017-11-14 DIAGNOSIS — R69 Illness, unspecified: Secondary | ICD-10-CM | POA: Diagnosis not present

## 2017-11-26 ENCOUNTER — Encounter: Payer: Self-pay | Admitting: Family Medicine

## 2017-11-26 ENCOUNTER — Other Ambulatory Visit: Payer: Self-pay

## 2017-11-26 DIAGNOSIS — R69 Illness, unspecified: Secondary | ICD-10-CM | POA: Diagnosis not present

## 2017-11-26 MED ORDER — GLUCOSE BLOOD VI STRP: ORAL_STRIP | 3 refills | Status: DC

## 2017-12-03 ENCOUNTER — Other Ambulatory Visit: Payer: Self-pay

## 2017-12-03 MED ORDER — "INSULIN SYRINGE/NEEDLE 28G X 1/2"" 1 ML MISC": 3 refills | Status: DC

## 2017-12-03 NOTE — Telephone Encounter (Signed)
Sent refill

## 2017-12-17 ENCOUNTER — Ambulatory Visit (INDEPENDENT_AMBULATORY_CARE_PROVIDER_SITE_OTHER): Payer: Medicare HMO | Admitting: Family Medicine

## 2017-12-17 ENCOUNTER — Encounter: Payer: Self-pay | Admitting: Family Medicine

## 2017-12-17 VITALS — BP 126/82 | HR 101 | Temp 97.9°F | Ht 64.0 in | Wt 285.0 lb

## 2017-12-17 DIAGNOSIS — R0609 Other forms of dyspnea: Secondary | ICD-10-CM | POA: Diagnosis not present

## 2017-12-17 DIAGNOSIS — E118 Type 2 diabetes mellitus with unspecified complications: Secondary | ICD-10-CM

## 2017-12-17 DIAGNOSIS — R0600 Dyspnea, unspecified: Secondary | ICD-10-CM

## 2017-12-17 DIAGNOSIS — I10 Essential (primary) hypertension: Secondary | ICD-10-CM

## 2017-12-17 LAB — POCT GLYCOSYLATED HEMOGLOBIN (HGB A1C): Hemoglobin A1C: 6.1

## 2017-12-17 NOTE — Assessment & Plan Note (Signed)
Chronic, stable. Continue current regimen. 

## 2017-12-17 NOTE — Assessment & Plan Note (Signed)
Ongoing - thought largely deconditioning related. Encouraged continued efforts at weight loss.

## 2017-12-17 NOTE — Assessment & Plan Note (Signed)
Chronic, better controlled. Continue current regimen. No low sugars or hypoglycemic sxs. Foot exam today. Reassess at f/u in August.

## 2017-12-17 NOTE — Patient Instructions (Addendum)
Congratulations on healthy changes to date! Continue current medicines.  Keep physical appointment for August.

## 2017-12-17 NOTE — Assessment & Plan Note (Signed)
Congratulated on 15 lb weight loss over last 4 months. Pt and spouse motivated to continue efforts.

## 2017-12-17 NOTE — Progress Notes (Signed)
BP 126/82 (BP Location: Left Arm, Patient Position: Sitting, Cuff Size: Large)   Pulse (!) 101   Temp 97.9 F (36.6 C) (Oral)   Ht 5\' 4"  (1.626 m)   Wt 285 lb (129.3 kg)   SpO2 94%   BMI 48.92 kg/m    CC: 4 mo f/u visit Subjective:    Patient ID: [name], female    DOB: 1950-12-29, 67 y.o.   MRN: [id number]  HPI: [name] is a 67 y.o. female presenting on 12/17/2017 for 4 mo follow-up   Lost 15 pounds!! - gave up sodas, hot dogs and hamburger buns, potato chips.   DM - does regularly check sugars and brings log: fasting 110-140s, post dinner 100-200s. Compliant with antihyperglycemic regimen which includes: metformin 500mg  2 tab bid, novolin N 55 units at bedtime, actos 15mg  daily, glimepiride 4mg  with breakfast. Denies low sugars or hypoglycemic symptoms. Denies paresthesias. Last diabetic eye exam DUE - will reschedule. Pneumovax: 2012. Prevnar: 2019. Glucometer brand: one touch ultra. DSME: remotely. Lab Results  Component Value Date   HGBA1C 6.1 12/17/2017   Diabetic Foot Exam - Simple   Simple Foot Form Diabetic Foot exam was performed with the following findings:  Yes 12/17/2017  3:09 PM  Visual Inspection No deformities, no ulcerations, no other skin breakdown bilaterally:  Yes Sensation Testing Intact to touch and monofilament testing bilaterally:  Yes Pulse Check Posterior Tibialis and Dorsalis pulse intact bilaterally:  Yes Comments    Lab Results  Component Value Date   MICROALBUR 5.8 (H) 01/15/2015     Relevant past medical, surgical, family and social history reviewed and updated as indicated. Interim medical history since our last visit reviewed. Allergies and medications reviewed and updated. Outpatient Medications Prior to Visit  Medication Sig Dispense Refill  . aspirin EC 81 MG tablet Take 162 mg by mouth daily.    . carvedilol (COREG) 3.125 MG tablet Take 1 tablet (3.125 mg total) by mouth 2 (two) times daily with a meal. 180 tablet 3    . famotidine (PEPCID) 20 MG tablet Take 20 mg by mouth daily.    Marland Kitchen glimepiride (AMARYL) 4 MG tablet TAKE 1 TABLET DAILY WITH   BREAKFAST 90 tablet 0  . glucose blood test strip Use as instructed 200 each 3  . hydrochlorothiazide (HYDRODIURIL) 25 MG tablet TAKE 1 TABLET DAILY 90 tablet 0  . INS SYRINGE/NEEDLE 1CC/28G 28G X 1/2" 1 ML MISC Use as directed 100 each 3  . levothyroxine (SYNTHROID, LEVOTHROID) 100 MCG tablet TAKE 1 TABLET DAILY, AND ON2 DAYS A WEEK, TAKE AN     EXTRA 1/2 TABLET 90 tablet 2  . lisinopril (PRINIVIL,ZESTRIL) 5 MG tablet Take 1 tablet (5 mg total) by mouth daily. 90 tablet 3  . metFORMIN (GLUCOPHAGE) 500 MG tablet TAKE 2 TABLETS EVERY       MORNING AND TAKE 2 TABLETS AT NIGHT 360 tablet 0  . Multiple Vitamin (MULTIVITAMIN) tablet Take 1 tablet by mouth daily.    . Naproxen Sodium 220 MG CAPS Take 440 mg by mouth 2 (two) times daily.    Marland Kitchen NOVOLIN N 100 UNIT/ML injection 55 units daily    . nystatin cream (MYCOSTATIN) APPLY TO AFFECTED AREA TWICE A DAY 30 g 1  . ONE TOUCH LANCETS MISC Use to check sugar twice daily Dx: E11.8 200 each 3  . pioglitazone (ACTOS) 15 MG tablet TAKE 1 TABLET DAILY 90 tablet 1  . simvastatin (ZOCOR) 20 MG tablet TAKE  1 TABLET AT BEDTIME 90 tablet 0  . ziprasidone (GEODON) 40 MG capsule TAKE 1 CAPSULE TWICE DAILY WITH MEALS 180 capsule 1  . insulin glargine (LANTUS) 100 UNIT/ML injection Inject 0.45 mLs (45 Units total) into the skin at bedtime. 50 mL 3   No facility-administered medications prior to visit.      Per HPI unless specifically indicated in ROS section below Review of Systems     Objective:    BP 126/82 (BP Location: Left Arm, Patient Position: Sitting, Cuff Size: Large)   Pulse (!) 101   Temp 97.9 F (36.6 C) (Oral)   Ht 5\' 4"  (1.626 m)   Wt 285 lb (129.3 kg)   SpO2 94%   BMI 48.92 kg/m   Wt Readings from Last 3 Encounters:  12/17/17 285 lb (129.3 kg)  08/17/17 299 lb (135.6 kg)  04/17/17 (!) 302 lb 12 oz (137.3 kg)     Physical Exam  Constitutional: She appears well-developed and well-nourished. No distress.  HENT:  Head: Normocephalic and atraumatic.  Right Ear: External ear normal.  Left Ear: External ear normal.  Nose: Nose normal.  Mouth/Throat: Oropharynx is clear and moist. No oropharyngeal exudate.  Eyes: Pupils are equal, round, and reactive to light. Conjunctivae and EOM are normal. No scleral icterus.  Neck: Normal range of motion. Neck supple.  Cardiovascular: Normal rate, regular rhythm, normal heart sounds and intact distal pulses.  No murmur heard. Pulmonary/Chest: Effort normal and breath sounds normal. No respiratory distress. She has no wheezes. She has no rales.  Musculoskeletal: She exhibits no edema.  See HPI for foot exam if done  Lymphadenopathy:    She has no cervical adenopathy.  Skin: Skin is warm and dry. No rash noted.  Psychiatric: She has a normal mood and affect.  Nursing note and vitals reviewed.  Results for orders placed or performed in visit on 12/17/17  POCT glycosylated hemoglobin (Hb A1C)  Result Value Ref Range   Hemoglobin A1C 6.1       Assessment & Plan:   Problem List Items Addressed This Visit    Diabetes mellitus type 2, controlled, with complications ([place]) - Primary    Chronic, better controlled. Continue current regimen. No low sugars or hypoglycemic sxs. Foot exam today. Reassess at f/u in August.       Relevant Medications   NOVOLIN N 100 UNIT/ML injection   Other Relevant Orders   POCT glycosylated hemoglobin (Hb A1C) (Completed)   Dyspnea on exertion    Ongoing - thought largely deconditioning related. Encouraged continued efforts at weight loss.       Hypertension    Chronic, stable. Continue current regimen.       Obesity, morbid, BMI 40.0-49.9 ([place])    Congratulated on 15 lb weight loss over last 4 months. Pt and spouse motivated to continue efforts.       Relevant Medications   NOVOLIN N 100 UNIT/ML injection       No  orders of the defined types were placed in this encounter.  Orders Placed This Encounter  Procedures  . POCT glycosylated hemoglobin (Hb A1C)    Follow up plan: No follow-ups on file.  Ria Bush, MD

## 2017-12-17 NOTE — Addendum Note (Signed)
Addended by: Ria Bush on: 12/17/2017 03:12 PM   Modules accepted: Level of Service

## 2017-12-23 ENCOUNTER — Other Ambulatory Visit: Payer: Self-pay | Admitting: Family Medicine

## 2017-12-24 MED ORDER — METFORMIN HCL 500 MG PO TABS: ORAL_TABLET | ORAL | 1 refills | Status: DC

## 2017-12-26 DIAGNOSIS — H04123 Dry eye syndrome of bilateral lacrimal glands: Secondary | ICD-10-CM | POA: Diagnosis not present

## 2017-12-26 DIAGNOSIS — E089 Diabetes mellitus due to underlying condition without complications: Secondary | ICD-10-CM | POA: Diagnosis not present

## 2017-12-26 DIAGNOSIS — H2513 Age-related nuclear cataract, bilateral: Secondary | ICD-10-CM | POA: Diagnosis not present

## 2017-12-26 DIAGNOSIS — H524 Presbyopia: Secondary | ICD-10-CM | POA: Diagnosis not present

## 2017-12-26 DIAGNOSIS — E119 Type 2 diabetes mellitus without complications: Secondary | ICD-10-CM | POA: Diagnosis not present

## 2017-12-28 LAB — HM DIABETES EYE EXAM

## 2018-01-02 ENCOUNTER — Encounter: Payer: Self-pay | Admitting: Family Medicine

## 2018-01-07 ENCOUNTER — Other Ambulatory Visit: Payer: Self-pay | Admitting: Family Medicine

## 2018-01-07 MED ORDER — SIMVASTATIN 20 MG PO TABS: 20.0000 mg | ORAL_TABLET | Freq: Every day | ORAL | 1 refills | Status: DC

## 2018-02-02 ENCOUNTER — Other Ambulatory Visit: Payer: Self-pay | Admitting: Family Medicine

## 2018-02-04 MED ORDER — GLIMEPIRIDE 4 MG PO TABS: 4.0000 mg | ORAL_TABLET | Freq: Every day | ORAL | 0 refills | Status: DC

## 2018-02-04 MED ORDER — HYDROCHLOROTHIAZIDE 25 MG PO TABS: 25.0000 mg | ORAL_TABLET | Freq: Every day | ORAL | 0 refills | Status: DC

## 2018-02-14 ENCOUNTER — Other Ambulatory Visit: Payer: Self-pay | Admitting: Family Medicine

## 2018-02-14 DIAGNOSIS — R69 Illness, unspecified: Secondary | ICD-10-CM | POA: Diagnosis not present

## 2018-03-08 ENCOUNTER — Other Ambulatory Visit: Payer: Self-pay | Admitting: Family Medicine

## 2018-03-20 ENCOUNTER — Other Ambulatory Visit: Payer: Self-pay | Admitting: Family Medicine

## 2018-04-03 ENCOUNTER — Other Ambulatory Visit: Payer: Self-pay | Admitting: Family Medicine

## 2018-04-03 NOTE — Telephone Encounter (Signed)
Nystatin cream Last filled:  11/09/17, #30 g Last OV:  12/17/17, acute Next OV (CPE):  04/19/18

## 2018-04-11 ENCOUNTER — Ambulatory Visit: Payer: Self-pay

## 2018-04-14 ENCOUNTER — Other Ambulatory Visit: Payer: Self-pay | Admitting: Family Medicine

## 2018-04-14 DIAGNOSIS — E039 Hypothyroidism, unspecified: Secondary | ICD-10-CM

## 2018-04-14 DIAGNOSIS — Z794 Long term (current) use of insulin: Secondary | ICD-10-CM

## 2018-04-14 DIAGNOSIS — E118 Type 2 diabetes mellitus with unspecified complications: Secondary | ICD-10-CM

## 2018-04-14 DIAGNOSIS — E785 Hyperlipidemia, unspecified: Secondary | ICD-10-CM

## 2018-04-14 DIAGNOSIS — D72829 Elevated white blood cell count, unspecified: Secondary | ICD-10-CM

## 2018-04-15 ENCOUNTER — Ambulatory Visit: Payer: Self-pay

## 2018-04-15 ENCOUNTER — Ambulatory Visit (INDEPENDENT_AMBULATORY_CARE_PROVIDER_SITE_OTHER): Payer: Medicare HMO

## 2018-04-15 VITALS — BP 132/80 | HR 83 | Temp 97.9°F | Ht 61.5 in | Wt 283.0 lb

## 2018-04-15 DIAGNOSIS — E785 Hyperlipidemia, unspecified: Secondary | ICD-10-CM

## 2018-04-15 DIAGNOSIS — Z794 Long term (current) use of insulin: Secondary | ICD-10-CM | POA: Diagnosis not present

## 2018-04-15 DIAGNOSIS — D72829 Elevated white blood cell count, unspecified: Secondary | ICD-10-CM | POA: Diagnosis not present

## 2018-04-15 DIAGNOSIS — Z0000 Encounter for general adult medical examination without abnormal findings: Secondary | ICD-10-CM | POA: Diagnosis not present

## 2018-04-15 DIAGNOSIS — E118 Type 2 diabetes mellitus with unspecified complications: Secondary | ICD-10-CM | POA: Diagnosis not present

## 2018-04-15 DIAGNOSIS — E039 Hypothyroidism, unspecified: Secondary | ICD-10-CM

## 2018-04-15 LAB — CBC WITH DIFFERENTIAL/PLATELET
BASOS ABS: 0.1 10*3/uL (ref 0.0–0.1)
Basophils Relative: 0.6 % (ref 0.0–3.0)
Eosinophils Absolute: 0.2 10*3/uL (ref 0.0–0.7)
Eosinophils Relative: 1.3 % (ref 0.0–5.0)
HCT: 37.1 % (ref 36.0–46.0)
Hemoglobin: 12 g/dL (ref 12.0–15.0)
Lymphocytes Relative: 19.7 % (ref 12.0–46.0)
Lymphs Abs: 3.1 10*3/uL (ref 0.7–4.0)
MCHC: 32.2 g/dL (ref 30.0–36.0)
MCV: 89.3 fl (ref 78.0–100.0)
MONO ABS: 1 10*3/uL (ref 0.1–1.0)
Monocytes Relative: 6.3 % (ref 3.0–12.0)
NEUTROS PCT: 72.1 % (ref 43.0–77.0)
Neutro Abs: 11.2 10*3/uL — ABNORMAL HIGH (ref 1.4–7.7)
Platelets: 434 10*3/uL — ABNORMAL HIGH (ref 150.0–400.0)
RBC: 4.16 Mil/uL (ref 3.87–5.11)
RDW: 14.6 % (ref 11.5–15.5)
WBC: 15.6 10*3/uL — ABNORMAL HIGH (ref 4.0–10.5)

## 2018-04-15 LAB — COMPREHENSIVE METABOLIC PANEL
ALT: 20 U/L (ref 0–35)
AST: 18 U/L (ref 0–37)
Albumin: 4.2 g/dL (ref 3.5–5.2)
Alkaline Phosphatase: 48 U/L (ref 39–117)
BUN: 19 mg/dL (ref 6–23)
CALCIUM: 10 mg/dL (ref 8.4–10.5)
CHLORIDE: 95 meq/L — AB (ref 96–112)
CO2: 30 meq/L (ref 19–32)
Creatinine, Ser: 0.87 mg/dL (ref 0.40–1.20)
GFR: 69.33 mL/min (ref >60.00)
Glucose, Bld: 83 mg/dL (ref 70–99)
Potassium: 4.3 mEq/L (ref 3.5–5.1)
Sodium: 134 mEq/L — ABNORMAL LOW (ref 135–145)
Total Bilirubin: 0.2 mg/dL (ref 0.2–1.2)
Total Protein: 7.8 g/dL (ref 6.0–8.3)

## 2018-04-15 LAB — LIPID PANEL
CHOL/HDL RATIO: 4
Cholesterol: 142 mg/dL (ref 0–200)
HDL: 37.6 mg/dL — AB (ref >39.00)
NONHDL: 104.52
TRIGLYCERIDES: 352 mg/dL — AB (ref 0.0–149.0)
VLDL: 70.4 mg/dL — AB (ref 0.0–40.0)

## 2018-04-15 LAB — HEMOGLOBIN A1C: Hgb A1c MFr Bld: 6.4 % (ref 4.6–6.5)

## 2018-04-15 LAB — TSH: TSH: 3.52 u[IU]/mL (ref 0.35–4.50)

## 2018-04-15 LAB — LDL CHOLESTEROL, DIRECT: Direct LDL: 71 mg/dL

## 2018-04-15 NOTE — Progress Notes (Signed)
PCP notes:   Health maintenance:  Flu vaccine - addressed Bone density - PCP follow-up required PAP smear - appt scheduled per pt A1C - completed  Abnormal screenings:   None  Patient concerns:   Headaches are increasing in frequency on right side of head.   Nurse concerns:  None  Next PCP appt:   04/19/18 @ 1500

## 2018-04-15 NOTE — Patient Instructions (Signed)
Ms. [name] , Thank you for taking time to come for your Medicare Wellness Visit. I appreciate your ongoing commitment to your health goals. Please review the following plan we discussed and let me know if I can assist you in the future.   These are the goals we discussed: Goals    . Increase water intake     Starting 04/15/2018, I will continue to drink at least 6-8 glasses of water daily.        This is a list of the screening recommended for you and due dates:  Health Maintenance  Topic Date Due  . Pap Smear  04/21/2018*  . Flu Shot  11/20/2018*  . DEXA scan (bone density measurement)  11/20/2018*  . DTaP/Tdap/Td vaccine (1 - Tdap) 04/17/2021*  . Mammogram  05/31/2018  . Hemoglobin A1C  06/18/2018  . Pneumonia vaccines (2 of 2 - PPSV23) 11/15/2018  . Complete foot exam   12/18/2018  . Eye exam for diabetics  12/29/2018  . Tetanus Vaccine  04/17/2021  . Colon Cancer Screening  01/09/2022  .  Hepatitis C: One time screening is recommended by Center for Disease Control  (CDC) for  adults born from [birth year] through 1963.   Completed  . HIV Screening  Completed  *Topic was postponed. The date shown is not the original due date.   Preventive Care for Adults  A healthy lifestyle and preventive care can promote health and wellness. Preventive health guidelines for adults include the following key practices.  . A routine yearly physical is a good way to check with your health care provider about your health and preventive screening. It is a chance to share any concerns and updates on your health and to receive a thorough exam.  . Visit your dentist for a routine exam and preventive care every 6 months. Brush your teeth twice a day and floss once a day. Good oral hygiene prevents tooth decay and gum disease.  . The frequency of eye exams is based on your age, health, family medical history, use  of contact lenses, and other factors. Follow your health care provider's recommendations for  frequency of eye exams.  . Eat a healthy diet. Foods like vegetables, fruits, whole grains, low-fat dairy products, and lean protein foods contain the nutrients you need without too many calories. Decrease your intake of foods high in solid fats, added sugars, and salt. Eat the right amount of calories for you. Get information about a proper diet from your health care provider, if necessary.  . Regular physical exercise is one of the most important things you can do for your health. Most adults should get at least 150 minutes of moderate-intensity exercise (any activity that increases your heart rate and causes you to sweat) each week. In addition, most adults need muscle-strengthening exercises on 2 or more days a week.  Silver Sneakers may be a benefit available to you. To determine eligibility, you may visit the website: www.silversneakers.com or contact program at [phone] Mon-Fri between 8AM-8PM.   . Maintain a healthy weight. The body mass index (BMI) is a screening tool to identify possible weight problems. It provides an estimate of body fat based on height and weight. Your health care provider can find your BMI and can help you achieve or maintain a healthy weight.   For adults 20 years and older: ? A BMI below 18.5 is considered underweight. ? A BMI of 18.5 to 24.9 is normal. ? A BMI of 25 to 29.9  is considered overweight. ? A BMI of 30 and above is considered obese.   . Maintain normal blood lipids and cholesterol levels by exercising and minimizing your intake of saturated fat. Eat a balanced diet with plenty of fruit and vegetables. Blood tests for lipids and cholesterol should begin at age 46 and be repeated every 5 years. If your lipid or cholesterol levels are high, you are over 50, or you are at high risk for heart disease, you may need your cholesterol levels checked more frequently. Ongoing high lipid and cholesterol levels should be treated with medicines if diet and  exercise are not working.  . If you smoke, find out from your health care provider how to quit. If you do not use tobacco, please do not start.  . If you choose to drink alcohol, please do not consume more than 2 drinks per day. One drink is considered to be 12 ounces (355 mL) of beer, 5 ounces (148 mL) of wine, or 1.5 ounces (44 mL) of liquor.  . If you are 59-77 years old, ask your health care provider if you should take aspirin to prevent strokes.  . Use sunscreen. Apply sunscreen liberally and repeatedly throughout the day. You should seek shade when your shadow is shorter than you. Protect yourself by wearing long sleeves, pants, a wide-brimmed hat, and sunglasses year round, whenever you are outdoors.  . Once a month, do a whole body skin exam, using a mirror to look at the skin on your back. Tell your health care provider of new moles, moles that have irregular borders, moles that are larger than a pencil eraser, or moles that have changed in shape or color.

## 2018-04-15 NOTE — Progress Notes (Signed)
Subjective:   [name] is a 67 y.o. female who presents for Medicare Annual (Subsequent) preventive examination.  Review of Systems:  N/A Cardiac Risk Factors include: advanced age (>36men, >79 women);diabetes mellitus;dyslipidemia;obesity (BMI >30kg/m2);hypertension;sedentary lifestyle     Objective:     Vitals: BP 132/80 (BP Location: Left Arm, Patient Position: Sitting, Cuff Size: Normal)   Pulse 83   Temp 97.9 F (36.6 C) (Oral)   Ht 5' 1.5" (1.562 m) Comment: shoes  Wt 283 lb (128.4 kg)   SpO2 95%   BMI 52.61 kg/m   Body mass index is 52.61 kg/m.  Advanced Directives 04/15/2018 04/10/2017  Does Patient Have a Medical Advance Directive? No No  Would patient like information on creating a medical advance directive? No - Patient declined -    Tobacco Social History   Tobacco Use  Smoking Status Former Smoker  . Packs/day: 1.00  . Years: 43.00  . Pack years: 43.00  . Types: Cigarettes  . Last attempt to quit: 08/21/2009  . Years since quitting: 8.6  Smokeless Tobacco Never Used     Counseling given: No   Clinical Intake:  Pre-visit preparation completed: Yes  Pain : No/denies pain Pain Score: 0-No pain     Nutritional Status: BMI > 30  Obese Nutritional Risks: None Diabetes: Yes CBG done?: No Did pt. bring in CBG monitor from home?: No  How often do you need to have someone help you when you read instructions, pamphlets, or other written materials from your doctor or pharmacy?: 1 - Never What is the last grade level you completed in school?: 12th grade  Interpreter Needed?: No  Comments: pt lives with spouse Information entered by :: LPinson, LPN  Past Medical History:  Diagnosis Date  . Anxiety   . Bipolar disorder ([place])    has stopped seeing psychiatrist  . Chronic bronchitis   . Coronary artery disease, non-occlusive   . Dyspnea 2012   s/p pulm/cards w/u WNL, thought due to obesity/deconditioning  . Fibromyalgia   . GERD  (gastroesophageal reflux disease)   . History of chicken pox   . HLD (hyperlipidemia)   . Hypertension   . Hypothyroidism   . Migraine   . OSA (obstructive sleep apnea) 03/16/2011  . T2DM (type 2 diabetes mellitus) ([place]) 2012   DM education 06/2011  . Urinary incontinence    Past Surgical History:  Procedure Laterality Date  . cardiac catherization  03/2011   x3 [facility]), mild nonobstructive CAD  . COLONOSCOPY  12/2011   hyperplastic polyp x2,. diverticulosis, rec rpt 10 yrs  . ESOPHAGOGASTRODUODENOSCOPY  2006  . KNEE SURGERY  2006   left, torn meniscus  . TOTAL ABDOMINAL HYSTERECTOMY  2004   fibroids, cervix remained  . TOTAL KNEE ARTHROPLASTY Left 08/2012   [name], [facility]   Family History  Problem Relation Age of Onset  . Asthma Maternal Uncle   . Cancer Maternal Uncle        colon  . Aneurysm Maternal Uncle   . Colon cancer Maternal Uncle 16  . Hypertension Mother   . Stroke Mother   . Arthritis Mother   . COPD Sister   . Lung cancer Cousin   . Cancer Cousin        lung  . Cancer Father 36       brain tumor  . Diabetes Father   . Thyroid disease Father   . Cancer Maternal Aunt 48       breast  .  Cancer Paternal Uncle        prostate  . Coronary artery disease Neg Hx   . Stomach cancer Neg Hx    Social History   Socioeconomic History  . Marital status: Married    Spouse name: Not on file  . Number of children: Not on file  . Years of education: Not on file  . Highest education level: Not on file  Occupational History  . Occupation: disabled    Employer: [employer]  . Financial resource strain: Not on file  . Food insecurity:    Worry: Not on file    Inability: Not on file  . Transportation needs:    Medical: Not on file    Non-medical: Not on file  Tobacco Use  . Smoking status: Former Smoker    Packs/day: 1.00    Years: 43.00    Pack years: 43.00    Types: Cigarettes    Last attempt to quit: 08/21/2009    Years since  quitting: 8.6  . Smokeless tobacco: Never Used  Substance and Sexual Activity  . Alcohol use: No    Alcohol/week: 0.0 standard drinks  . Drug use: No  . Sexual activity: Not on file  Lifestyle  . Physical activity:    Days per week: Not on file    Minutes per session: Not on file  . Stress: Not on file  Relationships  . Social connections:    Talks on phone: Not on file    Gets together: Not on file    Attends religious service: Not on file    Active member of club or organization: Not on file    Attends meetings of clubs or organizations: Not on file    Relationship status: Not on file  Other Topics Concern  . Not on file  Social History Narrative   Caffeine: 2-3 coffee/am, 1 cup tea in afternoon   Lives with husband, 1 dog, 2 cats   Occupation: disability since 2009 for bipolar, previously worked at [employer]   Activity: nothing   Diet: not many fruits, good vegetables, good amt water    Outpatient Encounter Medications as of 04/15/2018  Medication Sig  . aspirin EC 81 MG tablet Take 162 mg by mouth daily.  . carvedilol (COREG) 3.125 MG tablet TAKE 1 TABLET TWICE A DAY  WITH MEALS  . famotidine (PEPCID) 20 MG tablet Take 20 mg by mouth daily.  Marland Kitchen glimepiride (AMARYL) 4 MG tablet Take 1 tablet (4 mg total) by mouth daily with breakfast.  . glucose blood test strip Use as instructed  . hydrochlorothiazide (HYDRODIURIL) 25 MG tablet Take 1 tablet (25 mg total) by mouth daily.  . INS SYRINGE/NEEDLE 1CC/28G 28G X 1/2" 1 ML MISC Use as directed  . levothyroxine (SYNTHROID, LEVOTHROID) 100 MCG tablet TAKE 1 TABLET DAILY, AND ON2 DAYS A WEEK, TAKE AN     EXTRA 1/2 TABLET  . lisinopril (PRINIVIL,ZESTRIL) 5 MG tablet Take 1 tablet (5 mg total) by mouth daily.  . metFORMIN (GLUCOPHAGE) 500 MG tablet TAKE 2 TABLETS EVERY MORNING AND TAKE 2 TABLETS AT NIGHT  . Multiple Vitamin (MULTIVITAMIN) tablet Take 1 tablet by mouth daily.  . Naproxen Sodium 220 MG CAPS Take 440 mg by mouth 2 (two)  times daily.  Marland Kitchen NOVOLIN N 100 UNIT/ML injection 55 units daily  . NOVOLIN N 100 UNIT/ML injection INJECT 55 UNITS            SUBCUTANEOUSLY AT BEDTIME  .  nystatin cream (MYCOSTATIN) APPLY TO AFFECTED AREA TWICE A DAY  . ONE TOUCH LANCETS MISC Use to check sugar twice daily Dx: E11.8  . pioglitazone (ACTOS) 15 MG tablet TAKE 1 TABLET DAILY  . simvastatin (ZOCOR) 20 MG tablet Take 1 tablet (20 mg total) by mouth at bedtime.  . ziprasidone (GEODON) 40 MG capsule TAKE 1 CAPSULE TWICE DAILY WITH MEALS   No facility-administered encounter medications on file as of 04/15/2018.     Activities of Daily Living In your present state of health, do you have any difficulty performing the following activities: 04/15/2018  Hearing? N  Vision? N  Difficulty concentrating or making decisions? N  Walking or climbing stairs? Y  Dressing or bathing? N  Doing errands, shopping? N  Preparing Food and eating ? N  Using the Toilet? N  In the past six months, have you accidently leaked urine? N  Do you have problems with loss of bowel control? N  Managing your Medications? N  Managing your Finances? N  Housekeeping or managing your Housekeeping? N  Some recent data might be hidden    Patient Care Team: Ria Bush, MD as PCP - General (Family Medicine) Bryson Ha, OD as Consulting Physician (Optometry)    Assessment:   This is a routine wellness examination for [name].   Hearing Screening   125Hz  250Hz  500Hz  1000Hz  2000Hz  3000Hz  4000Hz  6000Hz  8000Hz   Right ear:   40 40 40  40    Left ear:   40 40 40  40    Vision Screening Comments: May 2019 exam with Dr. Kerin Ransom    Exercise Activities and Dietary recommendations Current Exercise Habits: The patient does not participate in regular exercise at present, Exercise limited by: orthopedic condition(s)(bilateral knee pain)  Goals    . Increase water intake     Starting 04/15/2018, I will continue to drink at least 6-8 glasses of water daily.          Fall Risk Fall Risk  04/15/2018 04/10/2017 04/07/2016 01/22/2015 12/15/2013  Falls in the past year? No No No No No   Depression Screen PHQ 2/9 Scores 04/15/2018 04/10/2017 04/07/2016 01/22/2015  PHQ - 2 Score 0 0 0 0  PHQ- 9 Score 0 6 - -     Cognitive Function MMSE - Mini Mental State Exam 04/15/2018 04/10/2017  Orientation to time 5 5  Orientation to Place 5 5  Registration 3 3  Attention/ Calculation 0 0  Recall 3 1  Recall-comments - pt was unable to recall 1 of 3 words  Language- name 2 objects 0 0  Language- repeat 1 1  Language- follow 3 step command 3 3  Language- read & follow direction 0 0  Write a sentence 0 0  Copy design 0 0  Total score 20 18     PLEASE NOTE: A Mini-Cog screen was completed. Maximum score is 20. A value of 0 denotes this part of Folstein MMSE was not completed or the patient failed this part of the Mini-Cog screening.   Mini-Cog Screening Orientation to Time - Max 5 pts Orientation to Place - Max 5 pts Registration - Max 3 pts Recall - Max 3 pts Language Repeat - Max 1 pts Language Follow 3 Step Command - Max 3 pts   Immunization History  Administered Date(s) Administered  . Influenza Split 05/09/2011, 06/11/2012  . Influenza Whole 05/20/2010  . Influenza,inj,Quad PF,6+ Mos 07/14/2013, 06/16/2014, 04/30/2015, 05/31/2016, 05/10/2017  . Pneumococcal Conjugate-13 11/14/2017  .  Pneumococcal Polysaccharide-23 04/18/2011  . Td 04/18/2011  . Zoster 12/29/2013  . Zoster Recombinat (Shingrix) 11/14/2017, 01/02/2018    Screening Tests Health Maintenance  Topic Date Due  . PAP SMEAR  04/21/2018 (Originally 12/15/2016)  . INFLUENZA VACCINE  11/20/2018 (Originally 03/21/2018)  . DEXA SCAN  11/20/2018 (Originally 09/14/2017)  . DTaP/Tdap/Td (1 - Tdap) 04/17/2021 (Originally 04/19/2011)  . MAMMOGRAM  05/31/2018  . HEMOGLOBIN A1C  06/18/2018  . PNA vac Low Risk Adult (2 of 2 - PPSV23) 11/15/2018  . FOOT EXAM  12/18/2018  . OPHTHALMOLOGY EXAM   12/29/2018  . TETANUS/TDAP  04/17/2021  . COLONOSCOPY  01/09/2022  . Hepatitis C Screening  Completed  . HIV Screening  Completed     Plan:   I have personally reviewed, addressed, and noted the following in the patient's chart:  A. Medical and social history B. Use of alcohol, tobacco or illicit drugs  C. Current medications and supplements D. Functional ability and status E.  Nutritional status F.  Physical activity G. Advance directives H. List of other physicians I.  Hospitalizations, surgeries, and ER visits in previous 12 months J.  [place] to include hearing, vision, cognitive, depression L. Referrals and appointments - none  In addition, I have reviewed and discussed with patient certain preventive protocols, quality metrics, and best practice recommendations. A written personalized care plan for preventive services as well as general preventive health recommendations were provided to patient.  See attached scanned questionnaire for additional information.   Signed,   Lindell Noe, MHA, BS, LPN Health Coach

## 2018-04-18 ENCOUNTER — Other Ambulatory Visit: Payer: Self-pay | Admitting: Family Medicine

## 2018-04-18 DIAGNOSIS — Z1231 Encounter for screening mammogram for malignant neoplasm of breast: Secondary | ICD-10-CM

## 2018-04-19 ENCOUNTER — Ambulatory Visit (INDEPENDENT_AMBULATORY_CARE_PROVIDER_SITE_OTHER): Payer: Medicare HMO | Admitting: Family Medicine

## 2018-04-19 ENCOUNTER — Encounter: Payer: Self-pay | Admitting: Family Medicine

## 2018-04-19 VITALS — BP 122/64 | HR 92 | Temp 98.2°F | Ht 61.5 in | Wt 281.5 lb

## 2018-04-19 DIAGNOSIS — F3178 Bipolar disorder, in full remission, most recent episode mixed: Secondary | ICD-10-CM

## 2018-04-19 DIAGNOSIS — Z0000 Encounter for general adult medical examination without abnormal findings: Secondary | ICD-10-CM | POA: Diagnosis not present

## 2018-04-19 DIAGNOSIS — I10 Essential (primary) hypertension: Secondary | ICD-10-CM

## 2018-04-19 DIAGNOSIS — E039 Hypothyroidism, unspecified: Secondary | ICD-10-CM

## 2018-04-19 DIAGNOSIS — Z23 Encounter for immunization: Secondary | ICD-10-CM

## 2018-04-19 DIAGNOSIS — Z7189 Other specified counseling: Secondary | ICD-10-CM

## 2018-04-19 DIAGNOSIS — E785 Hyperlipidemia, unspecified: Secondary | ICD-10-CM

## 2018-04-19 DIAGNOSIS — R51 Headache: Secondary | ICD-10-CM

## 2018-04-19 DIAGNOSIS — G43909 Migraine, unspecified, not intractable, without status migrainosus: Secondary | ICD-10-CM | POA: Insufficient documentation

## 2018-04-19 DIAGNOSIS — R519 Headache, unspecified: Secondary | ICD-10-CM

## 2018-04-19 DIAGNOSIS — E118 Type 2 diabetes mellitus with unspecified complications: Secondary | ICD-10-CM

## 2018-04-19 DIAGNOSIS — R69 Illness, unspecified: Secondary | ICD-10-CM | POA: Diagnosis not present

## 2018-04-19 DIAGNOSIS — Z6841 Body Mass Index (BMI) 40.0 and over, adult: Secondary | ICD-10-CM

## 2018-04-19 DIAGNOSIS — Z794 Long term (current) use of insulin: Secondary | ICD-10-CM

## 2018-04-19 DIAGNOSIS — D72829 Elevated white blood cell count, unspecified: Secondary | ICD-10-CM | POA: Diagnosis not present

## 2018-04-19 NOTE — Assessment & Plan Note (Signed)
?  caffeine related vs cervicogenic - she will work towards limiting caffeine and update [place] with effect.

## 2018-04-19 NOTE — Assessment & Plan Note (Signed)
Chronic, stable. Continue current regimen. 

## 2018-04-19 NOTE — Assessment & Plan Note (Signed)
Advanced directives: does not have set in place. Packet provided previously. Husband would be HCPOA.

## 2018-04-19 NOTE — Assessment & Plan Note (Addendum)
Chronic, trig uncontrolled. Encouraged increased fatty fish in diet.  The 10-year ASCVD risk score Mikey Bussing DC Brooke Bonito., et al., 2013) is: 12.5%   Values used to calculate the score:     Age: 67 years     Sex: Female     Is Non-Hispanic African American: No     Diabetic: Yes     Tobacco smoker: No     Systolic Blood Pressure: 639 mmHg     Is BP treated: Yes     HDL Cholesterol: 37.6 mg/dL     Total Cholesterol: 142 mg/dL

## 2018-04-19 NOTE — Assessment & Plan Note (Addendum)
Chronic, stable, well controlled on current regimen. She may need to change insulin on insurance whim - will let me know if this happens.

## 2018-04-19 NOTE — Assessment & Plan Note (Signed)
Chronic, stable on geodon. Continue current regimen.

## 2018-04-19 NOTE — Assessment & Plan Note (Signed)
Chronic. Consider updated SPEP next labwork.

## 2018-04-19 NOTE — Assessment & Plan Note (Signed)
Preventative protocols reviewed and updated unless pt declined. Discussed healthy diet and lifestyle.  

## 2018-04-19 NOTE — Progress Notes (Signed)
BP 122/64 (BP Location: Left Arm, Patient Position: Sitting, Cuff Size: Large)   Pulse 92   Temp 98.2 F (36.8 C) (Oral)   Ht 5' 1.5" (1.562 m)   Wt 281 lb 8 oz (127.7 kg)   SpO2 95%   BMI 52.33 kg/m    CC: CPE Subjective:    Patient ID: [name], female    DOB: 02-22-1951, 68 y.o.   MRN: [id number]  HPI: [name] is a 67 y.o. female presenting on 04/19/2018 for Annual Exam (Pt 2. Pt accompained by her husband.)   [name] this week for medicare wellness visit. Note reviewed.    DM insulin likely will change formulary.  Increasing headaches noted - start R base of skull and travel up neck. Feels swelling at base of neck. ?caffeine related.   Preventative: COLONOSCOPY Date: 12/2011 hyperplastic polyp x2,. diverticulosis, rec rpt 10 yrs Mammogram WNL 05/2016. Had rough experience. She does breast exams at home and hasn't noticed any concerns. Upcoming appt 3D 05/2018. Intermittent intertrigo treated with nystatin.  Pap 2015 WNL.rpt Q3 years. S/p total hysterectomy with bilat oophorectomy (fibroids) but cervix remains. Bladder sling placed at that time (2014). Always normal pap smears. We attempted pap smear 2019 unsuccessfully - pt declines repeat Flu yearly Pneumovax 2012, prevnar 2019 Td 2012 zostavax - 2015 shingrix - completed 2019 Advanced directives: does not have set in place. Packet provided previously. Husband would be HCPOA.  Seat [name] use discussed Sunscreen use discussed. No changing moles.  Quit smoking 2011  Alcohol - none Dentist has dentures, difficulty with fit Eye exam - yearly  Caffeine: 2 coffee/am Lives with husband, 1 dog, 2 cats Occupation: disability since 2009 for bipolar, previously worked at [employer] Activity: tries to do arm exercises twice a week.  Diet: not many fruits, good vegetables, good amt water   Relevant past medical, surgical, family and social history reviewed and updated as indicated. Interim medical history since  our last visit reviewed. Allergies and medications reviewed and updated. Outpatient Medications Prior to Visit  Medication Sig Dispense Refill  . aspirin EC 81 MG tablet Take 162 mg by mouth daily.    . carvedilol (COREG) 3.125 MG tablet TAKE 1 TABLET TWICE A DAY  WITH MEALS 180 tablet 0  . famotidine (PEPCID) 20 MG tablet Take 20 mg by mouth daily.    Marland Kitchen glimepiride (AMARYL) 4 MG tablet Take 1 tablet (4 mg total) by mouth daily with breakfast. 90 tablet 0  . glucose blood test strip Use as instructed 200 each 3  . hydrochlorothiazide (HYDRODIURIL) 25 MG tablet Take 1 tablet (25 mg total) by mouth daily. 90 tablet 0  . INS SYRINGE/NEEDLE 1CC/28G 28G X 1/2" 1 ML MISC Use as directed 100 each 3  . levothyroxine (SYNTHROID, LEVOTHROID) 100 MCG tablet TAKE 1 TABLET DAILY, AND ON2 DAYS A WEEK, TAKE AN     EXTRA 1/2 TABLET 90 tablet 0  . lisinopril (PRINIVIL,ZESTRIL) 5 MG tablet Take 1 tablet (5 mg total) by mouth daily. 90 tablet 3  . metFORMIN (GLUCOPHAGE) 500 MG tablet TAKE 2 TABLETS EVERY MORNING AND TAKE 2 TABLETS AT NIGHT 360 tablet 1  . Multiple Vitamin (MULTIVITAMIN) tablet Take 1 tablet by mouth daily.    . Naproxen Sodium 220 MG CAPS Take 440 mg by mouth 2 (two) times daily.    Marland Kitchen NOVOLIN N 100 UNIT/ML injection 55 units daily    . NOVOLIN N 100 UNIT/ML injection INJECT 55 UNITS  SUBCUTANEOUSLY AT BEDTIME 50 mL 2  . nystatin cream (MYCOSTATIN) APPLY TO AFFECTED AREA TWICE A DAY 30 g 1  . ONE TOUCH LANCETS MISC Use to check sugar twice daily Dx: E11.8 200 each 3  . pioglitazone (ACTOS) 15 MG tablet TAKE 1 TABLET DAILY 90 tablet 0  . simvastatin (ZOCOR) 20 MG tablet Take 1 tablet (20 mg total) by mouth at bedtime. 90 tablet 1  . ziprasidone (GEODON) 40 MG capsule TAKE 1 CAPSULE TWICE DAILY WITH MEALS 180 capsule 0   No facility-administered medications prior to visit.      Per HPI unless specifically indicated in ROS section below Review of Systems  Constitutional: Negative for  activity change, appetite change, chills, fatigue, fever and unexpected weight change.  HENT: Negative for hearing loss.   Eyes: Negative for visual disturbance.  Respiratory: Negative for cough, chest tightness, shortness of breath and wheezing.   Cardiovascular: Negative for chest pain, palpitations and leg swelling.  Gastrointestinal: Negative for abdominal distention, abdominal pain, blood in stool, constipation, diarrhea, nausea and vomiting.  Genitourinary: Negative for difficulty urinating and hematuria.  Musculoskeletal: Negative for arthralgias, myalgias and neck pain.       L arm pain  Skin: Negative for rash.  Neurological: Positive for headaches. Negative for dizziness, seizures and syncope.  Hematological: Negative for adenopathy. Does not bruise/bleed easily.  Psychiatric/Behavioral: Negative for dysphoric mood. The patient is not nervous/anxious.        Objective:    BP 122/64 (BP Location: Left Arm, Patient Position: Sitting, Cuff Size: Large)   Pulse 92   Temp 98.2 F (36.8 C) (Oral)   Ht 5' 1.5" (1.562 m)   Wt 281 lb 8 oz (127.7 kg)   SpO2 95%   BMI 52.33 kg/m   Wt Readings from Last 3 Encounters:  04/19/18 281 lb 8 oz (127.7 kg)  04/15/18 283 lb (128.4 kg)  12/17/17 285 lb (129.3 kg)    Physical Exam  Constitutional: She is oriented to person, place, and time. She appears well-developed and well-nourished. No distress.  HENT:  Head: Normocephalic and atraumatic.  Right Ear: Hearing, tympanic membrane, external ear and ear canal normal.  Left Ear: Hearing, tympanic membrane, external ear and ear canal normal.  Nose: Nose normal.  Mouth/Throat: Uvula is midline, oropharynx is clear and moist and mucous membranes are normal. No oropharyngeal exudate, posterior oropharyngeal edema or posterior oropharyngeal erythema.  Eyes: Pupils are equal, round, and reactive to light. Conjunctivae and EOM are normal. No scleral icterus.  Neck: Normal range of motion. Neck  supple.  Cardiovascular: Normal rate, regular rhythm, normal heart sounds and intact distal pulses.  No murmur heard. Pulses:      Radial pulses are 2+ on the right side, and 2+ on the left side.  Pulmonary/Chest: Effort normal and breath sounds normal. No respiratory distress. She has no wheezes. She has no rales.  Abdominal: Soft. Bowel sounds are normal. She exhibits no distension and no mass. There is no tenderness. There is no rebound and no guarding. A hernia is present. Hernia confirmed positive in the ventral area.  Genitourinary: Vagina normal. Pelvic exam was performed with patient supine. There is no rash, tenderness, lesion or injury on the right labia. There is no rash, tenderness, lesion or injury on the left labia. Right adnexum displays no mass. Left adnexum displays no mass.  Genitourinary Comments:  Cervix palpated on bimanual exam but unable to get cervical sample for pap smear Uterus absent  Musculoskeletal:  Normal range of motion. She exhibits no edema.  Lymphadenopathy:    She has no cervical adenopathy.  Neurological: She is alert and oriented to person, place, and time.  CN grossly intact, station and gait intact  Skin: Skin is warm and dry. No rash noted.  Psychiatric: She has a normal mood and affect. Her behavior is normal. Judgment and thought content normal.  Nursing note and vitals reviewed.  Results for orders placed or performed in visit on 04/15/18  CBC with Differential/Platelet  Result Value Ref Range   WBC 15.6 (H) 4.0 - 10.5 K/uL   RBC 4.16 3.87 - 5.11 Mil/uL   Hemoglobin 12.0 12.0 - 15.0 g/dL   HCT 37.1 36.0 - 46.0 %   MCV 89.3 78.0 - 100.0 fl   MCHC 32.2 30.0 - 36.0 g/dL   RDW 14.6 11.5 - 15.5 %   Platelets 434.0 (H) 150.0 - 400.0 K/uL   Neutrophils Relative % 72.1 43.0 - 77.0 %   Lymphocytes Relative 19.7 12.0 - 46.0 %   Monocytes Relative 6.3 3.0 - 12.0 %   Eosinophils Relative 1.3 0.0 - 5.0 %   Basophils Relative 0.6 0.0 - 3.0 %   Neutro  Abs 11.2 (H) 1.4 - 7.7 K/uL   Lymphs Abs 3.1 0.7 - 4.0 K/uL   Monocytes Absolute 1.0 0.1 - 1.0 K/uL   Eosinophils Absolute 0.2 0.0 - 0.7 K/uL   Basophils Absolute 0.1 0.0 - 0.1 K/uL  TSH  Result Value Ref Range   TSH 3.52 0.35 - 4.50 uIU/mL  Hemoglobin A1c  Result Value Ref Range   Hgb A1c MFr Bld 6.4 4.6 - 6.5 %  Comprehensive metabolic panel  Result Value Ref Range   Sodium 134 (L) 135 - 145 mEq/L   Potassium 4.3 3.5 - 5.1 mEq/L   Chloride 95 (L) 96 - 112 mEq/L   CO2 30 19 - 32 mEq/L   Glucose, Bld 83 70 - 99 mg/dL   BUN 19 6 - 23 mg/dL   Creatinine, Ser 0.87 0.40 - 1.20 mg/dL   Total Bilirubin 0.2 0.2 - 1.2 mg/dL   Alkaline Phosphatase 48 39 - 117 U/L   AST 18 0 - 37 U/L   ALT 20 0 - 35 U/L   Total Protein 7.8 6.0 - 8.3 g/dL   Albumin 4.2 3.5 - 5.2 g/dL   Calcium 10.0 8.4 - 10.5 mg/dL   GFR 69.33 >60.00 mL/min  Lipid panel  Result Value Ref Range   Cholesterol 142 0 - 200 mg/dL   Triglycerides 352.0 (H) 0.0 - 149.0 mg/dL   HDL 37.60 (L) >39.00 mg/dL   VLDL 70.4 (H) 0.0 - 40.0 mg/dL   Total CHOL/HDL Ratio 4    NonHDL 104.52   LDL cholesterol, direct  Result Value Ref Range   Direct LDL 71.0 mg/dL      Assessment & Plan:   Problem List Items Addressed This Visit    Morbid obesity with BMI of 50.0-59.9, adult (HCC)   Leukocytosis    Chronic. Consider updated SPEP next labwork.       Hypothyroidism    Chronic, stable. Continue current regimen.       Hypertension    Chronic, stable. Continue current regimen.       HLD (hyperlipidemia)    Chronic, trig uncontrolled. Encouraged increased fatty fish in diet.  The 10-year ASCVD risk score Mikey Bussing DC Jr., et al., 2013) is: 12.5%   Values used to calculate the score:  Age: 27 years     Sex: Female     Is Non-Hispanic African American: No     Diabetic: Yes     Tobacco smoker: No     Systolic Blood Pressure: 808 mmHg     Is BP treated: Yes     HDL Cholesterol: 37.6 mg/dL     Total Cholesterol: 142 mg/dL         Health maintenance examination - Primary    Preventative protocols reviewed and updated unless pt declined. Discussed healthy diet and lifestyle.       Headache    ?caffeine related vs cervicogenic - she will work towards limiting caffeine and update [place] with effect.      Diabetes mellitus type 2, controlled, with complications ([place])    Chronic, stable, well controlled on current regimen. She may need to change insulin on insurance whim - will let me know if this happens.       Bipolar disorder (HCC)    Chronic, stable on geodon. Continue current regimen.       Advanced care planning/counseling discussion    Advanced directives: does not have set in place. Packet provided previously. Husband would be HCPOA.        Other Visit Diagnoses    Need for influenza vaccination       Relevant Orders   Flu Vaccine QUAD 36+ mos IM (Completed)   Need for vaccination with 13-polyvalent pneumococcal conjugate vaccine       Relevant Orders   Pneumococcal conjugate vaccine 13-valent IM (Completed)       No orders of the defined types were placed in this encounter.  Orders Placed This Encounter  Procedures  . Flu Vaccine QUAD 36+ mos IM  . Pneumococcal conjugate vaccine 13-valent IM    Follow up plan: Return in about 4 months (around 08/19/2018) for follow up visit.  Ria Bush, MD

## 2018-04-19 NOTE — Patient Instructions (Addendum)
Flu shot today, prevnar today.  Work on advanced directive packet Back off caffeine to see if improvement in headaches.  Good to see you today, call us with questions. Return as needed or in 4 months for follow up visit.   Health Maintenance, Female Adopting a healthy lifestyle and getting preventive care can go a long way to promote health and wellness. Talk with your health care provider about what schedule of regular examinations is right for you. This is a good chance for you to check in with your provider about disease prevention and staying healthy. In between checkups, there are plenty of things you can do on your own. Experts have done a lot of research about which lifestyle changes and preventive measures are most likely to keep you healthy. Ask your health care provider for more information. Weight and diet Eat a healthy diet  Be sure to include plenty of vegetables, fruits, low-fat dairy products, and lean protein.  Do not eat a lot of foods high in solid fats, added sugars, or salt.  Get regular exercise. This is one of the most important things you can do for your health. ? Most adults should exercise for at least 150 minutes each week. The exercise should increase your heart rate and make you sweat (moderate-intensity exercise). ? Most adults should also do strengthening exercises at least twice a week. This is in addition to the moderate-intensity exercise.  Maintain a healthy weight  Body mass index (BMI) is a measurement that can be used to identify possible weight problems. It estimates body fat based on height and weight. Your health care provider can help determine your BMI and help you achieve or maintain a healthy weight.  For females 19 years of age and older: ? A BMI below 18.5 is considered underweight. ? A BMI of 18.5 to 24.9 is normal. ? A BMI of 25 to 29.9 is considered overweight. ? A BMI of 30 and above is considered obese.  Watch levels of cholesterol and  blood lipids  You should start having your blood tested for lipids and cholesterol at 65 years of age, then have this test every 5 years.  You may need to have your cholesterol levels checked more often if: ? Your lipid or cholesterol levels are high. ? You are older than 65 years of age. ? You are at high risk for heart disease.  Cancer screening Lung Cancer  Lung cancer screening is recommended for adults 45-24 years old who are at high risk for lung cancer because of a history of smoking.  A yearly low-dose CT scan of the lungs is recommended for people who: ? Currently smoke. ? Have quit within the past 15 years. ? Have at least a 30-pack-year history of smoking. A pack year is smoking an average of one pack of cigarettes a day for 1 year.  Yearly screening should continue until it has been 15 years since you quit.  Yearly screening should stop if you develop a health problem that would prevent you from having lung cancer treatment.  Breast Cancer  Practice breast self-awareness. This means understanding how your breasts normally appear and feel.  It also means doing regular breast self-exams. Let your health care provider know about any changes, no matter how small.  If you are in your 20s or 30s, you should have a clinical breast exam (CBE) by a health care provider every 1-3 years as part of a regular health exam.  If you are  40 or older, have a CBE every year. Also consider having a breast X-ray (mammogram) every year.  If you have a family history of breast cancer, talk to your health care provider about genetic screening.  If you are at high risk for breast cancer, talk to your health care provider about having an MRI and a mammogram every year.  Breast cancer gene (BRCA) assessment is recommended for women who have family members with BRCA-related cancers. BRCA-related cancers include: ? Breast. ? Ovarian. ? Tubal. ? Peritoneal cancers.  Results of the assessment  will determine the need for genetic counseling and BRCA1 and BRCA2 testing.  Cervical Cancer Your health care provider may recommend that you be screened regularly for cancer of the pelvic organs (ovaries, uterus, and vagina). This screening involves a pelvic examination, including checking for microscopic changes to the surface of your cervix (Pap test). You may be encouraged to have this screening done every 3 years, beginning at age 21.  For women ages 30-65, health care providers may recommend pelvic exams and Pap testing every 3 years, or they may recommend the Pap and pelvic exam, combined with testing for human papilloma virus (HPV), every 5 years. Some types of HPV increase your risk of cervical cancer. Testing for HPV may also be done on women of any age with unclear Pap test results.  Other health care providers may not recommend any screening for nonpregnant women who are considered low risk for pelvic cancer and who do not have symptoms. Ask your health care provider if a screening pelvic exam is right for you.  If you have had past treatment for cervical cancer or a condition that could lead to cancer, you need Pap tests and screening for cancer for at least 20 years after your treatment. If Pap tests have been discontinued, your risk factors (such as having a new sexual partner) need to be reassessed to determine if screening should resume. Some women have medical problems that increase the chance of getting cervical cancer. In these cases, your health care provider may recommend more frequent screening and Pap tests.  Colorectal Cancer  This type of cancer can be detected and often prevented.  Routine colorectal cancer screening usually begins at 65 years of age and continues through 65 years of age.  Your health care provider may recommend screening at an earlier age if you have risk factors for colon cancer.  Your health care provider may also recommend using home test kits to  check for hidden blood in the stool.  A small camera at the end of a tube can be used to examine your colon directly (sigmoidoscopy or colonoscopy). This is done to check for the earliest forms of colorectal cancer.  Routine screening usually begins at age 50.  Direct examination of the colon should be repeated every 5-10 years through 65 years of age. However, you may need to be screened more often if early forms of precancerous polyps or small growths are found.  Skin Cancer  Check your skin from head to toe regularly.  Tell your health care provider about any new moles or changes in moles, especially if there is a change in a mole's shape or color.  Also tell your health care provider if you have a mole that is larger than the size of a pencil eraser.  Always use sunscreen. Apply sunscreen liberally and repeatedly throughout the day.  Protect yourself by wearing long sleeves, pants, a wide-brimmed hat, and sunglasses whenever you   are outside.  Heart disease, diabetes, and high blood pressure  High blood pressure causes heart disease and increases the risk of stroke. High blood pressure is more likely to develop in: ? People who have blood pressure in the high end of the normal range (130-139/85-89 mm Hg). ? People who are overweight or obese. ? People who are African American.  If you are 42-93 years of age, have your blood pressure checked every 3-5 years. If you are 65 years of age or older, have your blood pressure checked every year. You should have your blood pressure measured twice-once when you are at a hospital or clinic, and once when you are not at a hospital or clinic. Record the average of the two measurements. To check your blood pressure when you are not at a hospital or clinic, you can use: ? An automated blood pressure machine at a pharmacy. ? A home blood pressure monitor.  If you are between 22 years and 47 years old, ask your health care provider if you should  take aspirin to prevent strokes.  Have regular diabetes screenings. This involves taking a blood sample to check your fasting blood sugar level. ? If you are at a normal weight and have a low risk for diabetes, have this test once every three years after 65 years of age. ? If you are overweight and have a high risk for diabetes, consider being tested at a younger age or more often. Preventing infection Hepatitis B  If you have a higher risk for hepatitis B, you should be screened for this virus. You are considered at high risk for hepatitis B if: ? You were born in a country where hepatitis B is common. Ask your health care provider which countries are considered high risk. ? Your parents were born in a high-risk country, and you have not been immunized against hepatitis B (hepatitis B vaccine). ? You have HIV or AIDS. ? You use needles to inject street drugs. ? You live with someone who has hepatitis B. ? You have had sex with someone who has hepatitis B. ? You get hemodialysis treatment. ? You take certain medicines for conditions, including cancer, organ transplantation, and autoimmune conditions.  Hepatitis C  Blood testing is recommended for: ? Everyone born from [birth year] through 1963. ? Anyone with known risk factors for hepatitis C.  Sexually transmitted infections (STIs)  You should be screened for sexually transmitted infections (STIs) including gonorrhea and chlamydia if: ? You are sexually active and are younger than 65 years of age. ? You are older than 65 years of age and your health care provider tells you that you are at risk for this type of infection. ? Your sexual activity has changed since you were last screened and you are at an increased risk for chlamydia or gonorrhea. Ask your health care provider if you are at risk.  If you do not have HIV, but are at risk, it may be recommended that you take a prescription medicine daily to prevent HIV infection. This is called  pre-exposure prophylaxis (PrEP). You are considered at risk if: ? You are sexually active and do not regularly use condoms or know the HIV status of your partner(s). ? You take drugs by injection. ? You are sexually active with a partner who has HIV.  Talk with your health care provider about whether you are at high risk of being infected with HIV. If you choose to begin PrEP, you should first be  tested for HIV. You should then be tested every 3 months for as long as you are taking PrEP. Pregnancy  If you are premenopausal and you may become pregnant, ask your health care provider about preconception counseling.  If you may become pregnant, take 400 to 800 micrograms (mcg) of folic acid every day.  If you want to prevent pregnancy, talk to your health care provider about birth control (contraception). Osteoporosis and menopause  Osteoporosis is a disease in which the bones lose minerals and strength with aging. This can result in serious bone fractures. Your risk for osteoporosis can be identified using a bone density scan.  If you are 65 years of age or older, or if you are at risk for osteoporosis and fractures, ask your health care provider if you should be screened.  Ask your health care provider whether you should take a calcium or vitamin D supplement to lower your risk for osteoporosis.  Menopause may have certain physical symptoms and risks.  Hormone replacement therapy may reduce some of these symptoms and risks. Talk to your health care provider about whether hormone replacement therapy is right for you. Follow these instructions at home:  Schedule regular health, dental, and eye exams.  Stay current with your immunizations.  Do not use any tobacco products including cigarettes, chewing tobacco, or electronic cigarettes.  If you are pregnant, do not drink alcohol.  If you are breastfeeding, limit how much and how often you drink alcohol.  Limit alcohol intake to no more  than 1 drink per day for nonpregnant women. One drink equals 12 ounces of beer, 5 ounces of wine, or 1 ounces of hard liquor.  Do not use street drugs.  Do not share needles.  Ask your health care provider for help if you need support or information about quitting drugs.  Tell your health care provider if you often feel depressed.  Tell your health care provider if you have ever been abused or do not feel safe at home. This information is not intended to replace advice given to you by your health care provider. Make sure you discuss any questions you have with your health care provider. Document Released: 02/20/2011 Document Revised: 01/13/2016 Document Reviewed: 05/11/2015 Elsevier Interactive Patient Education  2018 [employer]  

## 2018-05-06 ENCOUNTER — Other Ambulatory Visit: Payer: Self-pay | Admitting: Family Medicine

## 2018-05-06 DIAGNOSIS — R69 Illness, unspecified: Secondary | ICD-10-CM | POA: Diagnosis not present

## 2018-05-19 NOTE — Progress Notes (Signed)
I reviewed health advisor's note, was available for consultation, and agree with documentation and plan.  

## 2018-05-21 ENCOUNTER — Other Ambulatory Visit: Payer: Self-pay | Admitting: Family Medicine

## 2018-05-31 ENCOUNTER — Ambulatory Visit
Admission: RE | Admit: 2018-05-31 | Discharge: 2018-05-31 | Disposition: A | Discharge status: Home / Self Care | Payer: Medicare HMO | Source: Ambulatory Visit | Attending: Family Medicine | Admitting: Family Medicine

## 2018-05-31 DIAGNOSIS — Z1231 Encounter for screening mammogram for malignant neoplasm of breast: Secondary | ICD-10-CM

## 2018-06-03 ENCOUNTER — Other Ambulatory Visit: Payer: Self-pay | Admitting: Family Medicine

## 2018-06-20 ENCOUNTER — Other Ambulatory Visit: Payer: Self-pay | Admitting: Family Medicine

## 2018-06-28 ENCOUNTER — Telehealth: Payer: Self-pay | Admitting: *Deleted

## 2018-06-28 MED ORDER — SIMVASTATIN 20 MG PO TABS: 20.0000 mg | ORAL_TABLET | Freq: Every day | ORAL | 1 refills | Status: DC

## 2018-06-28 NOTE — Telephone Encounter (Signed)
rx sent to pharmacy

## 2018-07-25 DIAGNOSIS — R69 Illness, unspecified: Secondary | ICD-10-CM | POA: Diagnosis not present

## 2018-08-19 ENCOUNTER — Encounter: Payer: Self-pay | Admitting: Family Medicine

## 2018-08-19 NOTE — Progress Notes (Signed)
BP 126/70 (BP Location: Left Arm, Patient Position: Sitting, Cuff Size: Large)   Pulse 87   Temp 97.6 F (36.4 C) (Oral)   Ht 5' 1.5" (1.562 m)   Wt 272 lb (123.4 kg)   SpO2 96%   BMI 50.56 kg/m    CC: 4 mo f/u visit Subjective:    Patient ID: [name], female    DOB: [birth year], 67 y.o.   MRN: [id number]  HPI: [name] is a 67 y.o. female presenting on 08/20/2018 for Follow-up (Here for 4 mo f/u. )   Requests omeprazole 20mg  in place of famotidine.   DM - does regularly check sugars twice daily, forgot log at home. Some 200s post prandial, but largely 160-170. Fasting sugars 120-130. Compliant with antihyperglycemic regimen which includes: amaryl 4mg  daily, metformin 500mg  2 tab bid, novolin N 55u nightly, actos 15mg  daily. Denies low sugars or hypoglycemic symptoms. Some foot paresthesias. Last diabetic eye exam 12/2017. Pneumovax: 2012. Prevnar: 2019 x2. Glucometer brand: one touch. DSME: completed 2012.  Lab Results  Component Value Date   HGBA1C 5.9 (A) 08/20/2018   Diabetic Foot Exam - Simple   Simple Foot Form Diabetic Foot exam was performed with the following findings:  Yes 08/20/2018 11:45 AM  Visual Inspection No deformities, no ulcerations, no other skin breakdown bilaterally:  Yes Sensation Testing Intact to touch and monofilament testing bilaterally:  Yes Pulse Check Posterior Tibialis and Dorsalis pulse intact bilaterally:  Yes Comments    Lab Results  Component Value Date   MICROALBUR 5.8 (H) 01/15/2015    Lab Results  Component Value Date   WBC 15.6 (H) 04/15/2018   HGB 12.0 04/15/2018   HCT 37.1 04/15/2018   MCV 89.3 04/15/2018   PLT 434.0 (H) 04/15/2018        Relevant past medical, surgical, family and social history reviewed and updated as indicated. Interim medical history since our last visit reviewed. Allergies and medications reviewed and updated. Outpatient Medications Prior to Visit  Medication Sig Dispense Refill  .  aspirin EC 81 MG tablet Take 162 mg by mouth daily.    . carvedilol (COREG) 3.125 MG tablet TAKE 1 TABLET TWICE A DAY  WITH MEALS 180 tablet 3  . famotidine (PEPCID) 20 MG tablet Take 20 mg by mouth daily.    Marland Kitchen glucose blood test strip Use as instructed 200 each 3  . hydrochlorothiazide (HYDRODIURIL) 25 MG tablet TAKE 1 TABLET DAILY 90 tablet 3  . INS SYRINGE/NEEDLE 1CC/28G 28G X 1/2" 1 ML MISC Use as directed 100 each 3  . levothyroxine (SYNTHROID, LEVOTHROID) 100 MCG tablet TAKE 1 TABLET DAILY, AND ON2 DAYS A WEEK, TAKE AN     EXTRA 1/2 TABLET 90 tablet 3  . lisinopril (PRINIVIL,ZESTRIL) 5 MG tablet TAKE 1 TABLET DAILY 90 tablet 3  . metFORMIN (GLUCOPHAGE) 500 MG tablet TAKE 2 TABLETS EVERY       MORNING AND TAKE 2 TABLETS AT NIGHT 360 tablet 1  . Multiple Vitamin (MULTIVITAMIN) tablet Take 1 tablet by mouth daily.    . Naproxen Sodium 220 MG CAPS Take 440 mg by mouth 2 (two) times daily.    Marland Kitchen NOVOLIN N 100 UNIT/ML injection INJECT 55 UNITS            SUBCUTANEOUSLY AT BEDTIME 50 mL 2  . nystatin cream (MYCOSTATIN) APPLY TO AFFECTED AREA TWICE A DAY 30 g 1  . ONE TOUCH LANCETS MISC Use to check sugar twice daily Dx: E11.8  200 each 3  . pioglitazone (ACTOS) 15 MG tablet TAKE 1 TABLET DAILY 90 tablet 1  . simvastatin (ZOCOR) 20 MG tablet Take 1 tablet (20 mg total) by mouth at bedtime. 90 tablet 1  . ziprasidone (GEODON) 40 MG capsule TAKE 1 CAPSULE TWICE DAILY WITH MEALS 180 capsule 1  . glimepiride (AMARYL) 4 MG tablet TAKE 1 TABLET DAILY WITH   BREAKFAST 90 tablet 1  . NOVOLIN N 100 UNIT/ML injection 55 units daily     No facility-administered medications prior to visit.      Per HPI unless specifically indicated in ROS section below Review of Systems Objective:    BP 126/70 (BP Location: Left Arm, Patient Position: Sitting, Cuff Size: Large)   Pulse 87   Temp 97.6 F (36.4 C) (Oral)   Ht 5' 1.5" (1.562 m)   Wt 272 lb (123.4 kg)   SpO2 96%   BMI 50.56 kg/m   Wt Readings from  Last 3 Encounters:  08/20/18 272 lb (123.4 kg)  04/19/18 281 lb 8 oz (127.7 kg)  04/15/18 283 lb (128.4 kg)    Physical Exam Vitals signs and nursing note reviewed.  Constitutional:      General: She is not in acute distress.    Appearance: She is well-developed.  HENT:     Head: Normocephalic and atraumatic.     Right Ear: External ear normal.     Left Ear: External ear normal.     Nose: Nose normal.     Mouth/Throat:     Pharynx: No oropharyngeal exudate.  Eyes:     General: No scleral icterus.    Conjunctiva/sclera: Conjunctivae normal.     Pupils: Pupils are equal, round, and reactive to light.  Neck:     Musculoskeletal: Normal range of motion and neck supple.  Cardiovascular:     Rate and Rhythm: Normal rate and regular rhythm.     Heart sounds: Normal heart sounds. No murmur.  Pulmonary:     Effort: Pulmonary effort is normal. No respiratory distress.     Breath sounds: Normal breath sounds. No wheezing or rales.  Musculoskeletal:     Comments: See HPI for foot exam if done  Lymphadenopathy:     Cervical: No cervical adenopathy.  Skin:    General: Skin is warm and dry.     Findings: No rash.       Results for orders placed or performed in visit on 08/20/18  POCT glycosylated hemoglobin (Hb A1C)  Result Value Ref Range   Hemoglobin A1C 5.9 (A) 4.0 - 5.6 %   HbA1c POC (<> result, manual entry)     HbA1c, POC (prediabetic range)     HbA1c, POC (controlled diabetic range)    POCT Urinalysis Dipstick (Automated)  Result Value Ref Range   Color, UA yellow    Clarity, UA clear    Glucose, UA Negative Negative   Bilirubin, UA negative    Ketones, UA negative    Spec Grav, UA 1.010 1.010 - 1.025   Blood, UA negative    pH, UA 6.0 5.0 - 8.0   Protein, UA Negative Negative   Urobilinogen, UA 0.2 0.2 or 1.0 E.U./dL   Nitrite, UA negative    Leukocytes, UA Moderate (2+) (A) Negative  Micro: WBC 3-5 RBC 0 Bact tr Casts none Epi rare UCx not sent as pt  asxs  Assessment & Plan:   Problem List Items Addressed This Visit    Morbid obesity with BMI  of 50.0-59.9, adult [facility])    Congratulated on ongoing weight loss to date. Doing better on lower actos.       Relevant Medications   glimepiride (AMARYL) 2 MG tablet   Hypertension    Chronic, stable. Continue current regimen.       GERD (gastroesophageal reflux disease)    Requests omeprazole 20mg  refilled - discussed alternating with pepcid.       Relevant Medications   omeprazole (PRILOSEC) 20 MG capsule   Diabetes mellitus type 2, controlled, with complications ([place]) - Primary    Chronic, improved. Continue current regimen, but will decrease amaryl to 2mg  daily. Congratulated on good control to date. Check UA with ongoing actos use.       Relevant Medications   glimepiride (AMARYL) 2 MG tablet   Other Relevant Orders   POCT glycosylated hemoglobin (Hb A1C) (Completed)   POCT Urinalysis Dipstick (Automated) (Completed)       Meds ordered this encounter  Medications  . glimepiride (AMARYL) 2 MG tablet    Sig: Take 1 tablet (2 mg total) by mouth daily with breakfast.    Dispense:  90 tablet    Refill:  1    Note new sig  . omeprazole (PRILOSEC) 20 MG capsule    Sig: Take 1 capsule (20 mg total) by mouth daily.    Dispense:  90 capsule    Refill:  1   Orders Placed This Encounter  Procedures  . POCT glycosylated hemoglobin (Hb A1C)  . POCT Urinalysis Dipstick (Automated)   Patient Instructions  Urinalysis today. Congratulations on weight loss! Sugars are doing well! Call to schedule eye exam for new eye symptoms.  Decrease amaryl (glimepiride) to 2mg  with breakfast.  Omeprazole sent to pharmacy - take this alternating with famotidine.  Bring me copy of latest pneumonia shot at [place].    Follow up plan: Return in about 4 months (around 12/19/2018) for follow up visit.  Ria Bush, MD

## 2018-08-20 ENCOUNTER — Ambulatory Visit (INDEPENDENT_AMBULATORY_CARE_PROVIDER_SITE_OTHER): Payer: Medicare HMO | Admitting: Family Medicine

## 2018-08-20 ENCOUNTER — Encounter: Payer: Self-pay | Admitting: Family Medicine

## 2018-08-20 VITALS — BP 126/70 | HR 87 | Temp 97.6°F | Ht 61.5 in | Wt 272.0 lb

## 2018-08-20 DIAGNOSIS — Z794 Long term (current) use of insulin: Secondary | ICD-10-CM | POA: Diagnosis not present

## 2018-08-20 DIAGNOSIS — E118 Type 2 diabetes mellitus with unspecified complications: Secondary | ICD-10-CM | POA: Diagnosis not present

## 2018-08-20 DIAGNOSIS — I10 Essential (primary) hypertension: Secondary | ICD-10-CM

## 2018-08-20 DIAGNOSIS — K219 Gastro-esophageal reflux disease without esophagitis: Secondary | ICD-10-CM

## 2018-08-20 DIAGNOSIS — Z6841 Body Mass Index (BMI) 40.0 and over, adult: Secondary | ICD-10-CM | POA: Diagnosis not present

## 2018-08-20 LAB — POC URINALSYSI DIPSTICK (AUTOMATED)
Bilirubin, UA: NEGATIVE
Blood, UA: NEGATIVE
Glucose, UA: NEGATIVE
Ketones, UA: NEGATIVE
Nitrite, UA: NEGATIVE
Protein, UA: NEGATIVE
Spec Grav, UA: 1.01 (ref 1.010–1.025)
Urobilinogen, UA: 0.2 E.U./dL
pH, UA: 6 (ref 5.0–8.0)

## 2018-08-20 LAB — POCT GLYCOSYLATED HEMOGLOBIN (HGB A1C): Hemoglobin A1C: 5.9 % — AB (ref 4.0–5.6)

## 2018-08-20 MED ORDER — OMEPRAZOLE 20 MG PO CPDR: 20.0000 mg | DELAYED_RELEASE_CAPSULE | Freq: Every day | ORAL | 1 refills | Status: DC

## 2018-08-20 MED ORDER — GLIMEPIRIDE 2 MG PO TABS: 2.0000 mg | ORAL_TABLET | Freq: Every day | ORAL | 1 refills | Status: DC

## 2018-08-20 NOTE — Patient Instructions (Addendum)
Urinalysis today. Congratulations on weight loss! Sugars are doing well! Call to schedule eye exam for new eye symptoms.  Decrease amaryl (glimepiride) to 2mg  with breakfast.  Omeprazole sent to pharmacy - take this alternating with famotidine.  Bring me copy of latest pneumonia shot at [place].

## 2018-08-20 NOTE — Assessment & Plan Note (Addendum)
Chronic, improved. Continue current regimen, but will decrease amaryl to 2mg  daily. Congratulated on good control to date. Check UA with ongoing actos use.

## 2018-08-20 NOTE — Assessment & Plan Note (Addendum)
Chronic, stable. Continue current regimen. 

## 2018-08-20 NOTE — Assessment & Plan Note (Signed)
Congratulated on ongoing weight loss to date. Doing better on lower actos.

## 2018-08-20 NOTE — Assessment & Plan Note (Addendum)
Requests omeprazole 20mg  refilled - discussed alternating with pepcid.

## 2018-08-31 ENCOUNTER — Telehealth: Payer: Self-pay | Admitting: Family Medicine

## 2018-08-31 NOTE — Telephone Encounter (Signed)
plz notify - reviewed records they brought - received extra prevnar 13 at our office 03/2018 that she didn't need. Should be ok. DOES need 1 more pneumovax later this year 2020.

## 2018-09-03 NOTE — Telephone Encounter (Signed)
Spoke with pt relaying Dr. G's message. Pt verbalizes understanding.  

## 2018-10-11 ENCOUNTER — Other Ambulatory Visit: Payer: Self-pay | Admitting: Family Medicine

## 2018-10-11 DIAGNOSIS — R69 Illness, unspecified: Secondary | ICD-10-CM | POA: Diagnosis not present

## 2018-11-02 ENCOUNTER — Other Ambulatory Visit: Payer: Self-pay | Admitting: Family Medicine

## 2018-11-14 ENCOUNTER — Other Ambulatory Visit: Payer: Self-pay | Admitting: Family Medicine

## 2018-11-14 NOTE — Telephone Encounter (Signed)
[facility] Last filled:  08/02/18, #[id number] Last OV:  08/20/18, f/u Next OV:  12/25/18, 4 mo f/u

## 2018-12-24 ENCOUNTER — Encounter: Payer: Self-pay | Admitting: Family Medicine

## 2018-12-25 ENCOUNTER — Other Ambulatory Visit: Payer: Self-pay

## 2018-12-25 ENCOUNTER — Encounter: Payer: Self-pay | Admitting: Family Medicine

## 2018-12-25 ENCOUNTER — Other Ambulatory Visit: Payer: Self-pay | Admitting: Family Medicine

## 2018-12-25 ENCOUNTER — Ambulatory Visit: Payer: Self-pay | Admitting: Family Medicine

## 2018-12-25 ENCOUNTER — Ambulatory Visit (INDEPENDENT_AMBULATORY_CARE_PROVIDER_SITE_OTHER): Payer: Medicare HMO | Admitting: Family Medicine

## 2018-12-25 VITALS — BP 136/66 | HR 102 | Temp 97.8°F | Ht 61.5 in | Wt 267.4 lb

## 2018-12-25 DIAGNOSIS — M542 Cervicalgia: Secondary | ICD-10-CM | POA: Insufficient documentation

## 2018-12-25 DIAGNOSIS — R69 Illness, unspecified: Secondary | ICD-10-CM | POA: Diagnosis not present

## 2018-12-25 MED ORDER — GLIMEPIRIDE 2 MG PO TABS: 2.0000 mg | ORAL_TABLET | Freq: Every day | ORAL | 1 refills | Status: DC

## 2018-12-25 MED ORDER — SIMVASTATIN 20 MG PO TABS: 20.0000 mg | ORAL_TABLET | Freq: Every day | ORAL | 1 refills | Status: DC

## 2018-12-25 MED ORDER — METHOCARBAMOL 500 MG PO TABS: 500.0000 mg | ORAL_TABLET | Freq: Three times a day (TID) | ORAL | 0 refills | Status: DC | PRN

## 2018-12-25 NOTE — Progress Notes (Signed)
This visit was in person.  BP 136/66 (BP Location: Left Arm, Patient Position: Sitting, Cuff Size: Large)   Pulse (!) 102   Temp 97.8 F (36.6 C) (Oral)   Ht 5' 1.5" (1.562 m)   Wt 267 lb 7 oz (121.3 kg)   SpO2 96%   BMI 49.71 kg/m    CC: neck pain Subjective:    Patient ID: [name], female    DOB: 04/29/1951, 68 y.o.   MRN: [id number]  HPI: [name] is a 68 y.o. female presenting on 12/25/2018 for Neck Pain (C/o severe neck pain. Started 3 days ago. )   4d h/o severe R neck pain with radiation to R shoulder. Popping sensation to right neck started prior to pain.  Unsure if she pulled muscle or pinched nerve.  Denies inciting trauma or injury. Has had some near falls - has been able to catch herself.   Using OTC naprosyn and tylenol arthritis without benefit.   No shooting pain down arms or legs, numbness or paresthesias of arms. No fevers/chills.  Hasn't noted aggravating or alleviating factors.  She sleeps on her left side.      Relevant past medical, surgical, family and social history reviewed and updated as indicated. Interim medical history since our last visit reviewed. Allergies and medications reviewed and updated. Outpatient Medications Prior to Visit  Medication Sig Dispense Refill  . aspirin EC 81 MG tablet Take 162 mg by mouth daily.    . carvedilol (COREG) 3.125 MG tablet TAKE 1 TABLET TWICE A DAY  WITH MEALS 180 tablet 3  . famotidine (PEPCID) 20 MG tablet Take 20 mg by mouth daily. Alternates with omeprazole    . glucose blood test strip Use to check blood sugar twice a day 200 each 3  . hydrochlorothiazide (HYDRODIURIL) 25 MG tablet TAKE 1 TABLET DAILY 90 tablet 3  . INS SYRINGE/NEEDLE 1CC/28G (B-D INS SYR MICROFINE 1CC/28G) 28G X 1/2" 1 ML MISC USE AS DIRECTED 100 each 3  . levothyroxine (SYNTHROID, LEVOTHROID) 100 MCG tablet TAKE 1 TABLET DAILY, AND ON2 DAYS A WEEK, TAKE AN     EXTRA 1/2 TABLET 90 tablet 3  . lisinopril (PRINIVIL,ZESTRIL) 5  MG tablet TAKE 1 TABLET DAILY 90 tablet 3  . metFORMIN (GLUCOPHAGE) 500 MG tablet TAKE 2 TABLETS EVERY       MORNING AND TAKE 2 TABLETS AT NIGHT 360 tablet 0  . Multiple Vitamin (MULTIVITAMIN) tablet Take 1 tablet by mouth daily.    . Naproxen Sodium 220 MG CAPS Take 440 mg by mouth 2 (two) times daily.    Marland Kitchen NOVOLIN N 100 UNIT/ML injection 55 units daily    . NOVOLIN N 100 UNIT/ML injection INJECT 55 UNITS            SUBCUTANEOUSLY AT BEDTIME 50 mL 2  . nystatin cream (MYCOSTATIN) APPLY TO AFFECTED AREA TWICE A DAY 30 g 1  . omeprazole (PRILOSEC) 20 MG capsule Take 1 capsule (20 mg total) by mouth daily. 90 capsule 1  . ONE TOUCH LANCETS MISC Use to check sugar twice daily Dx: E11.8 200 each 3  . pioglitazone (ACTOS) 15 MG tablet TAKE 1 TABLET DAILY 90 tablet 0  . ziprasidone (GEODON) 40 MG capsule TAKE 1 CAPSULE TWICE DAILY WITH MEALS 180 capsule 1  . glimepiride (AMARYL) 2 MG tablet Take 1 tablet (2 mg total) by mouth daily with breakfast. 90 tablet 1  . simvastatin (ZOCOR) 20 MG tablet Take 1 tablet (  20 mg total) by mouth at bedtime. 90 tablet 1   No facility-administered medications prior to visit.      Per HPI unless specifically indicated in ROS section below Review of Systems Objective:    BP 136/66 (BP Location: Left Arm, Patient Position: Sitting, Cuff Size: Large)   Pulse (!) 102   Temp 97.8 F (36.6 C) (Oral)   Ht 5' 1.5" (1.562 m)   Wt 267 lb 7 oz (121.3 kg)   SpO2 96%   BMI 49.71 kg/m   Wt Readings from Last 3 Encounters:  12/25/18 267 lb 7 oz (121.3 kg)  08/20/18 272 lb (123.4 kg)  04/19/18 281 lb 8 oz (127.7 kg)    Physical Exam Vitals signs and nursing note reviewed.  Constitutional:      Appearance: Normal appearance. She is not ill-appearing.  Neck:     Musculoskeletal: Normal range of motion and neck supple. Muscular tenderness present. No neck rigidity.     Comments:  Mild midline lower cervical spine discomfort Marked tenderness to palpation at  occipital insertion on right Discomfort with palpation of R trapezius mm as well as R paracervical mm Pain with lateral rotation to left otherwise FROM at cervical neck Musculoskeletal: Normal range of motion.        General: Tenderness present.  Neurological:     Mental Status: She is alert.     Comments:  5/5 strength BUE Grip strength intact Sensation intact       Assessment & Plan:   Problem List Items Addressed This Visit    Neck pain on right side - Primary    Anticipate cervical neck strain. Supportive care reviewed as per instructions. Rx tylenol, naprosyn, Rx robaxin muscle relaxant. Provided with neck strain exercises. Update if not improving, consider stronger pain medication. Pt agrees with plan.           Meds ordered this encounter  Medications  . glimepiride (AMARYL) 2 MG tablet    Sig: Take 1 tablet (2 mg total) by mouth daily with breakfast.    Dispense:  90 tablet    Refill:  1  . simvastatin (ZOCOR) 20 MG tablet    Sig: Take 1 tablet (20 mg total) by mouth at bedtime.    Dispense:  90 tablet    Refill:  1  . methocarbamol (ROBAXIN) 500 MG tablet    Sig: Take 1 tablet (500 mg total) by mouth 3 (three) times daily as needed for muscle spasms (sedation precautions).    Dispense:  30 tablet    Refill:  0   No orders of the defined types were placed in this encounter.  Patient Instructions  I think you have cervical neck strain.  Continue naprosyn twice daily with meals as well as tylenol, add muscle relaxant robaxin.  Continue heating pad, icy hot, gentle stretching.  Do exercises provided today.  Let us know if not improving with this, or if worsening.    Follow up plan: Return if symptoms worsen or fail to improve.  Ria Bush, MD

## 2018-12-25 NOTE — Patient Instructions (Addendum)
I think you have cervical neck strain.  Continue naprosyn twice daily with meals as well as tylenol, add muscle relaxant robaxin.  Continue heating pad, icy hot, gentle stretching.  Do exercises provided today.  Let us know if not improving with this, or if worsening.

## 2018-12-25 NOTE — Telephone Encounter (Signed)
Can you see you schedule a virtual visit for this pt?  She will probably have to see another provider since Dr. Synthia Innocent schedule is full for today.

## 2018-12-25 NOTE — Assessment & Plan Note (Addendum)
Anticipate cervical neck strain. Supportive care reviewed as per instructions. Rx tylenol, naprosyn, Rx robaxin muscle relaxant. Provided with neck strain exercises. Update if not improving, consider stronger pain medication. Pt agrees with plan.

## 2018-12-25 NOTE — Telephone Encounter (Signed)
Pt scheduled for In office visit with Dr. Darnell Level 12/25/18 @ 3pm

## 2018-12-28 ENCOUNTER — Encounter: Payer: Self-pay | Admitting: Family Medicine

## 2018-12-30 MED ORDER — PREDNISONE 20 MG PO TABS: ORAL_TABLET | ORAL | 0 refills | Status: DC

## 2018-12-30 NOTE — Telephone Encounter (Signed)
Spoke with patient, pain traveling to left side into head, despite icy hot, heating pad, muscle relaxant TID.  Not improving.  No shooting pain down arms, numbness or weakness down arms.  Will Rx prednisone taper, she wil; update me with effect.

## 2019-01-14 ENCOUNTER — Encounter: Payer: Self-pay | Admitting: Family Medicine

## 2019-01-14 DIAGNOSIS — M542 Cervicalgia: Secondary | ICD-10-CM

## 2019-01-16 MED ORDER — TRAMADOL HCL 50 MG PO TABS: 50.0000 mg | ORAL_TABLET | Freq: Three times a day (TID) | ORAL | 0 refills | Status: AC | PRN

## 2019-01-16 NOTE — Telephone Encounter (Signed)
Pt called back. She is also having pain in her head. Since 5/2 pain started in her neck and it goes up into her right side of head. She says it is excruciating pain and does not stop. She is asking if she can have her head checked in addition to her neck. She plans to come in for x-ray Fri 5/29.

## 2019-01-16 NOTE — Telephone Encounter (Signed)
Spoke with pt relaying Dr. Synthia Innocent message and instructions.  Informed pt no schedule is needed.  Just let front desk know she is here for an x-ray.  Pt verbalizes understanding.

## 2019-01-16 NOTE — Telephone Encounter (Signed)
plz call to schedule neck xray. I have also sent tramadol to try at home. Don't take with methocarbamol muscle relaxant, don't take and drive.

## 2019-01-17 ENCOUNTER — Other Ambulatory Visit: Payer: Self-pay

## 2019-01-17 ENCOUNTER — Ambulatory Visit (INDEPENDENT_AMBULATORY_CARE_PROVIDER_SITE_OTHER)
Admission: RE | Admit: 2019-01-17 | Discharge: 2019-01-17 | Disposition: A | Discharge status: Home / Self Care | Payer: Medicare HMO | Source: Ambulatory Visit | Attending: Family Medicine | Admitting: Family Medicine

## 2019-01-17 DIAGNOSIS — M542 Cervicalgia: Secondary | ICD-10-CM

## 2019-01-17 NOTE — Addendum Note (Signed)
Addended by: Ria Bush on: 01/17/2019 11:13 AM   Modules accepted: Orders

## 2019-02-03 ENCOUNTER — Other Ambulatory Visit: Payer: Self-pay | Admitting: Family Medicine

## 2019-03-08 DIAGNOSIS — R69 Illness, unspecified: Secondary | ICD-10-CM | POA: Diagnosis not present

## 2019-03-11 ENCOUNTER — Other Ambulatory Visit: Payer: Self-pay | Admitting: *Deleted

## 2019-03-11 MED ORDER — OMEPRAZOLE 20 MG PO CPDR: 20.0000 mg | DELAYED_RELEASE_CAPSULE | Freq: Every day | ORAL | 0 refills | Status: DC

## 2019-03-11 MED ORDER — METFORMIN HCL 500 MG PO TABS: 1000.0000 mg | ORAL_TABLET | Freq: Two times a day (BID) | ORAL | 0 refills | Status: DC

## 2019-03-12 ENCOUNTER — Other Ambulatory Visit: Payer: Self-pay

## 2019-03-12 MED ORDER — METFORMIN HCL 500 MG PO TABS: 1000.0000 mg | ORAL_TABLET | Freq: Two times a day (BID) | ORAL | 0 refills | Status: DC

## 2019-03-15 ENCOUNTER — Other Ambulatory Visit: Payer: Self-pay | Admitting: Family Medicine

## 2019-04-04 ENCOUNTER — Other Ambulatory Visit: Payer: Self-pay | Admitting: Family Medicine

## 2019-04-16 ENCOUNTER — Other Ambulatory Visit: Payer: Self-pay | Admitting: Family Medicine

## 2019-04-16 DIAGNOSIS — E118 Type 2 diabetes mellitus with unspecified complications: Secondary | ICD-10-CM

## 2019-04-16 DIAGNOSIS — E039 Hypothyroidism, unspecified: Secondary | ICD-10-CM

## 2019-04-16 DIAGNOSIS — D72829 Elevated white blood cell count, unspecified: Secondary | ICD-10-CM

## 2019-04-16 DIAGNOSIS — Z794 Long term (current) use of insulin: Secondary | ICD-10-CM

## 2019-04-16 DIAGNOSIS — E785 Hyperlipidemia, unspecified: Secondary | ICD-10-CM

## 2019-04-17 ENCOUNTER — Ambulatory Visit: Payer: Self-pay

## 2019-04-17 ENCOUNTER — Other Ambulatory Visit (INDEPENDENT_AMBULATORY_CARE_PROVIDER_SITE_OTHER): Payer: Medicare HMO

## 2019-04-17 DIAGNOSIS — E118 Type 2 diabetes mellitus with unspecified complications: Secondary | ICD-10-CM

## 2019-04-17 DIAGNOSIS — E039 Hypothyroidism, unspecified: Secondary | ICD-10-CM | POA: Diagnosis not present

## 2019-04-17 DIAGNOSIS — Z794 Long term (current) use of insulin: Secondary | ICD-10-CM | POA: Diagnosis not present

## 2019-04-17 DIAGNOSIS — D72829 Elevated white blood cell count, unspecified: Secondary | ICD-10-CM | POA: Diagnosis not present

## 2019-04-17 DIAGNOSIS — E785 Hyperlipidemia, unspecified: Secondary | ICD-10-CM

## 2019-04-17 LAB — CBC WITH DIFFERENTIAL/PLATELET
Basophils Absolute: 0.1 10*3/uL (ref 0.0–0.1)
Basophils Relative: 0.7 % (ref 0.0–3.0)
Eosinophils Absolute: 0.2 10*3/uL (ref 0.0–0.7)
Eosinophils Relative: 1.5 % (ref 0.0–5.0)
HCT: 38 % (ref 36.0–46.0)
Hemoglobin: 12.2 g/dL (ref 12.0–15.0)
Lymphocytes Relative: 16.9 % (ref 12.0–46.0)
Lymphs Abs: 2.7 10*3/uL (ref 0.7–4.0)
MCHC: 32.1 g/dL (ref 30.0–36.0)
MCV: 89.3 fl (ref 78.0–100.0)
Monocytes Absolute: 1 10*3/uL (ref 0.1–1.0)
Monocytes Relative: 6.2 % (ref 3.0–12.0)
Neutro Abs: 11.9 10*3/uL — ABNORMAL HIGH (ref 1.4–7.7)
Neutrophils Relative %: 74.7 % (ref 43.0–77.0)
Platelets: 478 10*3/uL — ABNORMAL HIGH (ref 150.0–400.0)
RBC: 4.26 Mil/uL (ref 3.87–5.11)
RDW: 14.4 % (ref 11.5–15.5)
WBC: 15.9 10*3/uL — ABNORMAL HIGH (ref 4.0–10.5)

## 2019-04-17 LAB — COMPREHENSIVE METABOLIC PANEL
ALT: 17 U/L (ref 0–35)
AST: 18 U/L (ref 0–37)
Albumin: 4.4 g/dL (ref 3.5–5.2)
Alkaline Phosphatase: 51 U/L (ref 39–117)
BUN: 15 mg/dL (ref 6–23)
CO2: 29 mEq/L (ref 19–32)
Calcium: 10 mg/dL (ref 8.4–10.5)
Chloride: 89 mEq/L — ABNORMAL LOW (ref 96–112)
Creatinine, Ser: 0.79 mg/dL (ref 0.40–1.20)
GFR: 72.68 mL/min (ref >60.00)
Glucose, Bld: 91 mg/dL (ref 70–99)
Potassium: 3.9 mEq/L (ref 3.5–5.1)
Sodium: 131 mEq/L — ABNORMAL LOW (ref 135–145)
Total Bilirubin: 0.2 mg/dL (ref 0.2–1.2)
Total Protein: 8 g/dL (ref 6.0–8.3)

## 2019-04-17 LAB — LIPID PANEL
Cholesterol: 147 mg/dL (ref 0–200)
HDL: 38.9 mg/dL — ABNORMAL LOW (ref >39.00)
NonHDL: 108.52
Total CHOL/HDL Ratio: 4
Triglycerides: 265 mg/dL — ABNORMAL HIGH (ref 0.0–149.0)
VLDL: 53 mg/dL — ABNORMAL HIGH (ref 0.0–40.0)

## 2019-04-17 LAB — LDL CHOLESTEROL, DIRECT: Direct LDL: 74 mg/dL

## 2019-04-17 LAB — TSH: TSH: 3.96 u[IU]/mL (ref 0.35–4.50)

## 2019-04-17 LAB — HEMOGLOBIN A1C: Hgb A1c MFr Bld: 6.3 % (ref 4.6–6.5)

## 2019-04-18 ENCOUNTER — Other Ambulatory Visit: Payer: Self-pay | Admitting: Family Medicine

## 2019-04-18 DIAGNOSIS — Z1231 Encounter for screening mammogram for malignant neoplasm of breast: Secondary | ICD-10-CM

## 2019-04-21 ENCOUNTER — Other Ambulatory Visit: Payer: Self-pay | Admitting: Family Medicine

## 2019-04-24 ENCOUNTER — Ambulatory Visit (INDEPENDENT_AMBULATORY_CARE_PROVIDER_SITE_OTHER): Payer: Medicare HMO | Admitting: Family Medicine

## 2019-04-24 ENCOUNTER — Other Ambulatory Visit: Payer: Self-pay

## 2019-04-24 ENCOUNTER — Encounter: Payer: Self-pay | Admitting: Family Medicine

## 2019-04-24 VITALS — BP 128/68 | HR 89 | Temp 98.2°F | Ht 61.0 in | Wt 258.4 lb

## 2019-04-24 DIAGNOSIS — D72829 Elevated white blood cell count, unspecified: Secondary | ICD-10-CM

## 2019-04-24 DIAGNOSIS — Z0000 Encounter for general adult medical examination without abnormal findings: Secondary | ICD-10-CM

## 2019-04-24 DIAGNOSIS — Z23 Encounter for immunization: Secondary | ICD-10-CM

## 2019-04-24 DIAGNOSIS — Z7189 Other specified counseling: Secondary | ICD-10-CM

## 2019-04-24 DIAGNOSIS — E118 Type 2 diabetes mellitus with unspecified complications: Secondary | ICD-10-CM

## 2019-04-24 DIAGNOSIS — E039 Hypothyroidism, unspecified: Secondary | ICD-10-CM

## 2019-04-24 DIAGNOSIS — I10 Essential (primary) hypertension: Secondary | ICD-10-CM

## 2019-04-24 DIAGNOSIS — D473 Essential (hemorrhagic) thrombocythemia: Secondary | ICD-10-CM

## 2019-04-24 DIAGNOSIS — D75839 Thrombocytosis, unspecified: Secondary | ICD-10-CM | POA: Insufficient documentation

## 2019-04-24 DIAGNOSIS — Z794 Long term (current) use of insulin: Secondary | ICD-10-CM

## 2019-04-24 DIAGNOSIS — B372 Candidiasis of skin and nail: Secondary | ICD-10-CM

## 2019-04-24 DIAGNOSIS — F3178 Bipolar disorder, in full remission, most recent episode mixed: Secondary | ICD-10-CM

## 2019-04-24 DIAGNOSIS — R109 Unspecified abdominal pain: Secondary | ICD-10-CM | POA: Insufficient documentation

## 2019-04-24 DIAGNOSIS — E785 Hyperlipidemia, unspecified: Secondary | ICD-10-CM

## 2019-04-24 DIAGNOSIS — K219 Gastro-esophageal reflux disease without esophagitis: Secondary | ICD-10-CM

## 2019-04-24 DIAGNOSIS — M1712 Unilateral primary osteoarthritis, left knee: Secondary | ICD-10-CM

## 2019-04-24 MED ORDER — NYSTATIN 100000 UNIT/GM EX CREA: TOPICAL_CREAM | CUTANEOUS | 1 refills | Status: DC

## 2019-04-24 MED ORDER — SIMVASTATIN 40 MG PO TABS: 40.0000 mg | ORAL_TABLET | Freq: Every day | ORAL | 3 refills | Status: DC

## 2019-04-24 NOTE — Patient Instructions (Addendum)
Flu shot today Advanced directive packet provided today.  Return as needed or in 6 months for diabetes follow up  We will check abdominal ultrasound for tender swelling  Health Maintenance After Age 66 After age 47, you are at a higher risk for certain long-term diseases and infections as well as injuries from falls. Falls are a major cause of broken bones and head injuries in people who are older than age 46. Getting regular preventive care can help to keep you healthy and well. Preventive care includes getting regular testing and making lifestyle changes as recommended by your health care provider. Talk with your health care provider about:  Which screenings and tests you should have. A screening is a test that checks for a disease when you have no symptoms.  A diet and exercise plan that is right for you. What should I know about screenings and tests to prevent falls? Screening and testing are the best ways to find a health problem early. Early diagnosis and treatment give you the best chance of managing medical conditions that are common after age 49. Certain conditions and lifestyle choices may make you more likely to have a fall. Your health care provider may recommend:  Regular vision checks. Poor vision and conditions such as cataracts can make you more likely to have a fall. If you wear glasses, make sure to get your prescription updated if your vision changes.  Medicine review. Work with your health care provider to regularly review all of the medicines you are taking, including over-the-counter medicines. Ask your health care provider about any side effects that may make you more likely to have a fall. Tell your health care provider if any medicines that you take make you feel dizzy or sleepy.  Osteoporosis screening. Osteoporosis is a condition that causes the bones to get weaker. This can make the bones weak and cause them to break more easily.  Blood pressure screening. Blood  pressure changes and medicines to control blood pressure can make you feel dizzy.  Strength and balance checks. Your health care provider may recommend certain tests to check your strength and balance while standing, walking, or changing positions.  Foot health exam. Foot pain and numbness, as well as not wearing proper footwear, can make you more likely to have a fall.  Depression screening. You may be more likely to have a fall if you have a fear of falling, feel emotionally low, or feel unable to do activities that you used to do.  Alcohol use screening. Using too much alcohol can affect your balance and may make you more likely to have a fall. What actions can I take to lower my risk of falls? General instructions  Talk with your health care provider about your risks for falling. Tell your health care provider if: ? You fall. Be sure to tell your health care provider about all falls, even ones that seem minor. ? You feel dizzy, sleepy, or off-balance.  Take over-the-counter and prescription medicines only as told by your health care provider. These include any supplements.  Eat a healthy diet and maintain a healthy weight. A healthy diet includes low-fat dairy products, low-fat (lean) meats, and fiber from whole grains, beans, and lots of fruits and vegetables. Home safety  Remove any tripping hazards, such as rugs, cords, and clutter.  Install safety equipment such as grab bars in bathrooms and safety rails on stairs.  Keep rooms and walkways well-lit. Activity   Follow a regular exercise program  to stay fit. This will help you maintain your balance. Ask your health care provider what types of exercise are appropriate for you.  If you need a cane or walker, use it as recommended by your health care provider.  Wear supportive shoes that have nonskid soles. Lifestyle  Do not drink alcohol if your health care provider tells you not to drink.  If you drink alcohol, limit how  much you have: ? 0-1 drink a day for women. ? 0-2 drinks a day for men.  Be aware of how much alcohol is in your drink. In the [place] one drink equals one typical bottle of beer (12 oz), one-half glass of wine (5 oz), or one shot of hard liquor (1 oz).  Do not use any products that contain nicotine or tobacco, such as cigarettes and e-cigarettes. If you need help quitting, ask your health care provider. Summary  Having a healthy lifestyle and getting preventive care can help to protect your health and wellness after age 57.  Screening and testing are the best way to find a health problem early and help you avoid having a fall. Early diagnosis and treatment give you the best chance for managing medical conditions that are more common for people who are older than age 23.  Falls are a major cause of broken bones and head injuries in people who are older than age 23. Take precautions to prevent a fall at home.  Work with your health care provider to learn what changes you can make to improve your health and wellness and to prevent falls. This information is not intended to replace advice given to you by your health care provider. Make sure you discuss any questions you have with your health care provider. Document Released: 06/20/2017 Document Revised: 11/28/2018 Document Reviewed: 06/20/2017 Elsevier Patient Education  2020 [employer].

## 2019-04-24 NOTE — Assessment & Plan Note (Signed)
Ongoing weight loss noted. She does regular chair exercises TID every day. She is motivated to continue this.

## 2019-04-24 NOTE — Assessment & Plan Note (Addendum)
Chronic, stable on geodon prescribed through our office. She was unable previously to find in network psychiatrist.

## 2019-04-24 NOTE — Assessment & Plan Note (Signed)
Chronic despite TKR, limits activity.

## 2019-04-24 NOTE — Assessment & Plan Note (Signed)
Chronic, stable on regimen of famotidine and omeprazole.

## 2019-04-24 NOTE — Progress Notes (Signed)
This visit was conducted in person.  BP 128/68 (BP Location: Left Arm, Patient Position: Sitting, Cuff Size: Normal)   Pulse 89   Temp 98.2 F (36.8 C) (Temporal)   Ht 5\' 1"  (1.549 m)   Wt 258 lb 6 oz (117.2 kg)   SpO2 97%   BMI 48.82 kg/m    CC: AMW/CPE Subjective:    Patient ID: [name], female    DOB: 23-Dec-1950, 68 y.o.   MRN: [id number]  HPI: [name] is a 68 y.o. female presenting on 04/24/2019 for Medicare Wellness   Did not see health advisor this year.   Hearing Screening   125Hz  250Hz  500Hz  1000Hz  2000Hz  3000Hz  4000Hz  6000Hz  8000Hz   Right ear:   25 40 40  40    Left ear:   20 20 40  40    Vision Screening Comments: Last eye exam, 06/2018    Office Visit from 04/24/2019 in [facility] [place]  PHQ-2 Total Score  0      Fall Risk  04/24/2019 04/15/2018 04/10/2017 04/07/2016 01/22/2015  Falls in the past year? 0 No No No No     Preventative: COLONOSCOPY Date: 12/2011 hyperplastic polyp x2,. diverticulosis, rec rpt 10 yrs Mammogram WNL 05/2018.  Pap 2015 WNL.rpt Q3 years. S/p total hysterectomy with bilat oophorectomy (fibroids) but cervix remains. Bladder sling placed at that time (2014).Always normal pap smears. We attempted pap smear 2019 unsuccessfully - pt declines repeat.  Flu yearly Pneumovax 2012, prevnar 2019 Td 2012 zostavax- 2015 shingrix - completed 2019 Advanced directives: does not have set in place. Packet provided previously and again today. Husband would be HCPOA.  Seat belt use discussed Sunscreen use discussed. No changing moles.  Quit smoking 2011 Alcohol- none Dentist has dentures, difficulty with fit Eye exam - yearly Bowel - no constipation Bladder - no incontinence  Caffeine: 2 coffee/am Lives with husband, 1 dog, 2 cats Occupation: disability since 2009 for bipolar, previously worked at [employer] Activity:tries to do arm exercises twice a week. Diet: not many fruits, good vegetables, good amt water   Relevant past medical, surgical, family and social history reviewed and updated as indicated. Interim medical history since our last visit reviewed. Allergies and medications reviewed and updated. Outpatient Medications Prior to Visit  Medication Sig Dispense Refill  . aspirin EC 81 MG tablet Take 162 mg by mouth daily.    . carvedilol (COREG) 3.125 MG tablet TAKE 1 TABLET TWICE A DAY  WITH MEALS 180 tablet 0  . famotidine (PEPCID) 20 MG tablet Take 20 mg by mouth daily. Alternates with omeprazole    . glimepiride (AMARYL) 2 MG tablet Take 1 tablet (2 mg total) by mouth daily with breakfast. 90 tablet 1  . glucose blood test strip Use to check blood sugar twice a day 200 each 3  . hydrochlorothiazide (HYDRODIURIL) 25 MG tablet TAKE 1 TABLET DAILY 90 tablet 0  . INS SYRINGE/NEEDLE 1CC/28G (B-D INS SYR MICROFINE 1CC/28G) 28G X 1/2" 1 ML MISC USE AS DIRECTED 100 each 3  . levothyroxine (SYNTHROID) 100 MCG tablet TAKE 1 TABLET DAILY, AND ON2 DAYS A WEEK, TAKE AN     EXTRA 1/2 TABLET 90 tablet 0  . lisinopril (PRINIVIL,ZESTRIL) 5 MG tablet TAKE 1 TABLET DAILY 90 tablet 3  . metFORMIN (GLUCOPHAGE) 500 MG tablet TAKE 2 TABLETS (1,000 MG TOTAL) BY MOUTH 2 (TWO) TIMES DAILY WITH A MEAL. **DUE FOR OFFICE VISIT 120 tablet 0  . Multiple Vitamin (MULTIVITAMIN)  tablet Take 1 tablet by mouth daily.    . Naproxen Sodium 220 MG CAPS Take 440 mg by mouth 2 (two) times daily.    Marland Kitchen NOVOLIN N 100 UNIT/ML injection INJECT 55 UNITS            SUBCUTANEOUSLY AT BEDTIME 50 mL 2  . omeprazole (PRILOSEC) 20 MG capsule TAKE 1 CAPSULE BY MOUTH EVERY DAY (Patient taking differently: Alternates with famotidine) 30 capsule 0  . ONE TOUCH LANCETS MISC Use to check sugar twice daily Dx: E11.8 200 each 3  . pioglitazone (ACTOS) 15 MG tablet TAKE 1 TABLET DAILY 90 tablet 0  . ziprasidone (GEODON) 40 MG capsule TAKE 1 CAPSULE TWICE DAILY WITH MEALS 180 capsule 1  . nystatin cream (MYCOSTATIN) APPLY TO AFFECTED AREA TWICE A DAY  30 g 1  . simvastatin (ZOCOR) 20 MG tablet Take 1 tablet (20 mg total) by mouth at bedtime. 90 tablet 1  . NOVOLIN N 100 UNIT/ML injection 55 units daily    . methocarbamol (ROBAXIN) 500 MG tablet Take 1 tablet (500 mg total) by mouth 3 (three) times daily as needed for muscle spasms (sedation precautions). 30 tablet 0  . predniSONE (DELTASONE) 20 MG tablet Take two tablets daily for 3 days followed by one tablet daily for 4 days 10 tablet 0   No facility-administered medications prior to visit.      Per HPI unless specifically indicated in ROS section below Review of Systems  Constitutional: Negative for activity change, appetite change, chills, fatigue, fever and unexpected weight change.  HENT: Negative for hearing loss.   Eyes: Negative for visual disturbance.  Respiratory: Positive for shortness of breath (chronic). Negative for cough, chest tightness and wheezing.   Cardiovascular: Negative for chest pain, palpitations and leg swelling.  Gastrointestinal: Positive for abdominal pain (at presumed ventral hernia). Negative for abdominal distention, blood in stool, constipation, diarrhea, nausea and vomiting.  Genitourinary: Negative for difficulty urinating and hematuria.  Musculoskeletal: Negative for arthralgias, myalgias and neck pain.  Skin: Negative for rash.  Neurological: Positive for dizziness and headaches (R occipital region). Negative for seizures and syncope.  Hematological: Negative for adenopathy. Does not bruise/bleed easily.  Psychiatric/Behavioral: Negative for dysphoric mood. The patient is not nervous/anxious.    Objective:    BP 128/68 (BP Location: Left Arm, Patient Position: Sitting, Cuff Size: Normal)   Pulse 89   Temp 98.2 F (36.8 C) (Temporal)   Ht 5\' 1"  (1.549 m)   Wt 258 lb 6 oz (117.2 kg)   SpO2 97%   BMI 48.82 kg/m   Wt Readings from Last 3 Encounters:  04/24/19 258 lb 6 oz (117.2 kg)  12/25/18 267 lb 7 oz (121.3 kg)  08/20/18 272 lb (123.4 kg)     Physical Exam Vitals signs and nursing note reviewed.  Constitutional:      General: She is not in acute distress.    Appearance: Normal appearance. She is well-developed. She is not ill-appearing.  HENT:     Head: Normocephalic and atraumatic.     Right Ear: Hearing, tympanic membrane, ear canal and external ear normal.     Left Ear: Hearing, tympanic membrane, ear canal and external ear normal.     Nose: Nose normal.     Mouth/Throat:     Mouth: Mucous membranes are moist.     Pharynx: Uvula midline. No oropharyngeal exudate or posterior oropharyngeal erythema.  Eyes:     General: No scleral icterus.    Extraocular Movements:  Extraocular movements intact.     Conjunctiva/sclera: Conjunctivae normal.     Pupils: Pupils are equal, round, and reactive to light.  Neck:     Musculoskeletal: Normal range of motion and neck supple.     Vascular: No carotid bruit.  Cardiovascular:     Rate and Rhythm: Normal rate and regular rhythm.     Pulses: Normal pulses.          Radial pulses are 2+ on the right side and 2+ on the left side.     Heart sounds: Normal heart sounds. No murmur.  Pulmonary:     Effort: Pulmonary effort is normal. No respiratory distress.     Breath sounds: Normal breath sounds. No wheezing, rhonchi or rales.  Abdominal:     General: Abdomen is flat. Bowel sounds are normal. There is no distension.     Palpations: Abdomen is soft. There is no mass.     Tenderness: There is no abdominal tenderness. There is no guarding or rebound.     Hernia: No hernia is present.  Musculoskeletal: Normal range of motion.  Lymphadenopathy:     Cervical: No cervical adenopathy.  Skin:    General: Skin is warm and dry.     Findings: No rash.  Neurological:     General: No focal deficit present.     Mental Status: She is alert and oriented to person, place, and time.     Comments:  CN grossly intact, station and gait intact Recall 3/3 Calculation 5/5 [id number]  Psychiatric:         Mood and Affect: Mood normal.        Behavior: Behavior normal.        Thought Content: Thought content normal.        Judgment: Judgment normal.       Results for orders placed or performed in visit on 04/17/19  CBC with Differential/Platelet  Result Value Ref Range   WBC 15.9 (H) 4.0 - 10.5 K/uL   RBC 4.26 3.87 - 5.11 Mil/uL   Hemoglobin 12.2 12.0 - 15.0 g/dL   HCT 38.0 36.0 - 46.0 %   MCV 89.3 78.0 - 100.0 fl   MCHC 32.1 30.0 - 36.0 g/dL   RDW 14.4 11.5 - 15.5 %   Platelets 478.0 (H) 150.0 - 400.0 K/uL   Neutrophils Relative % 74.7 43.0 - 77.0 %   Lymphocytes Relative 16.9 12.0 - 46.0 %   Monocytes Relative 6.2 3.0 - 12.0 %   Eosinophils Relative 1.5 0.0 - 5.0 %   Basophils Relative 0.7 0.0 - 3.0 %   Neutro Abs 11.9 (H) 1.4 - 7.7 K/uL   Lymphs Abs 2.7 0.7 - 4.0 K/uL   Monocytes Absolute 1.0 0.1 - 1.0 K/uL   Eosinophils Absolute 0.2 0.0 - 0.7 K/uL   Basophils Absolute 0.1 0.0 - 0.1 K/uL  Hemoglobin A1c  Result Value Ref Range   Hgb A1c MFr Bld 6.3 4.6 - 6.5 %  TSH  Result Value Ref Range   TSH 3.96 0.35 - 4.50 uIU/mL  Comprehensive metabolic panel  Result Value Ref Range   Sodium 131 (L) 135 - 145 mEq/L   Potassium 3.9 3.5 - 5.1 mEq/L   Chloride 89 (L) 96 - 112 mEq/L   CO2 29 19 - 32 mEq/L   Glucose, Bld 91 70 - 99 mg/dL   BUN 15 6 - 23 mg/dL   Creatinine, Ser 0.79 0.40 - 1.20 mg/dL   Total Bilirubin 0.2  0.2 - 1.2 mg/dL   Alkaline Phosphatase 51 39 - 117 U/L   AST 18 0 - 37 U/L   ALT 17 0 - 35 U/L   Total Protein 8.0 6.0 - 8.3 g/dL   Albumin 4.4 3.5 - 5.2 g/dL   Calcium 10.0 8.4 - 10.5 mg/dL   GFR 72.68 >60.00 mL/min  Lipid panel  Result Value Ref Range   Cholesterol 147 0 - 200 mg/dL   Triglycerides 265.0 (H) 0.0 - 149.0 mg/dL   HDL 38.90 (L) >39.00 mg/dL   VLDL 53.0 (H) 0.0 - 40.0 mg/dL   Total CHOL/HDL Ratio 4    NonHDL 108.52   LDL cholesterol, direct  Result Value Ref Range   Direct LDL 74.0 mg/dL   Assessment & Plan:   Problem List Items  Addressed This Visit    Thrombocytosis ([place])    New - consider updated periph smear next labwork.       Osteoarthritis of left knee    Chronic despite TKR, limits activity.      Obesity, morbid, BMI 40.0-49.9 ([place])    Ongoing weight loss noted. She does regular chair exercises TID every day. She is motivated to continue this.       Medicare annual wellness visit, subsequent - Primary    I have personally reviewed the Medicare Annual Wellness questionnaire and have noted 1. The patient's medical and social history 2. Their use of alcohol, tobacco or illicit drugs 3. Their current medications and supplements 4. The patient's functional ability including ADL's, fall risks, home safety risks and hearing or visual impairment. Cognitive function has been assessed and addressed as indicated.  5. Diet and physical activity 6. Evidence for depression or mood disorders The patients weight, height, BMI have been recorded in the chart. I have made referrals, counseling and provided education to the patient based on review of the above and I have provided the pt with a written personalized care plan for preventive services. Provider list updated.. See scanned questionairre as needed for further documentation. Reviewed preventative protocols and updated unless pt declined.       Leukocytosis    Chronic, stable. Continue to monitor. Has had normal periph smear and reassuring SPEP in the past.       Hypothyroidism    Chronic, stable. Continue thyroid replacement.       Hypertension    Chronic, stable. Continue current regimen.       Relevant Medications   simvastatin (ZOCOR) 40 MG tablet   HLD (hyperlipidemia)    Chronic, trig remain high - will increase simvastatin to 40mg  daily. The 10-year ASCVD risk score Mikey Bussing DC Brooke Bonito., et al., 2013) is: 15.2%   Values used to calculate the score:     Age: 89 years     Sex: Female     Is Non-Hispanic African American: No     Diabetic: Yes      Tobacco smoker: No     Systolic Blood Pressure: 0000000 mmHg     Is BP treated: Yes     HDL Cholesterol: 38.9 mg/dL     Total Cholesterol: 147 mg/dL       Relevant Medications   simvastatin (ZOCOR) 40 MG tablet   Health maintenance examination    Preventative protocols reviewed and updated unless pt declined. Discussed healthy diet and lifestyle.       GERD (gastroesophageal reflux disease)    Chronic, stable on regimen of famotidine and omeprazole.  Diabetes mellitus type 2, controlled, with complications (HCC)    Chronic, stable. Continue current regimen.       Relevant Medications   simvastatin (ZOCOR) 40 MG tablet   Candidal intertrigo    Noted to R neck today - discussed nystatin use, refilled today.       Relevant Medications   nystatin cream (MYCOSTATIN)   Bipolar disorder (HCC)    Chronic, stable on geodon prescribed through our office. She was unable previously to find in network psychiatrist.      Advanced care planning/counseling discussion    Advanced directives: does not have set in place. Packet provided previously and again today. Husband would be HCPOA.       Abdominal discomfort    Ongoing discomfort around presumed ventral hernia. Given ongoing discomfort and limited exam due to body habitus, will check abd [place] as well.       Relevant Orders   US Abdomen Complete    Other Visit Diagnoses    Need for influenza vaccination       Relevant Orders   Flu Vaccine QUAD High Dose(Fluad) (Completed)       Meds ordered this encounter  Medications  . simvastatin (ZOCOR) 40 MG tablet    Sig: Take 1 tablet (40 mg total) by mouth at bedtime.    Dispense:  90 tablet    Refill:  3  . nystatin cream (MYCOSTATIN)    Sig: APPLY TO AFFECTED AREA TWICE A DAY    Dispense:  30 g    Refill:  1   Orders Placed This Encounter  Procedures  . US Abdomen Complete    Standing Status:   Future    Standing Expiration Date:   06/23/2020    Order Specific Question:    Reason for Exam (SYMPTOM  OR DIAGNOSIS REQUIRED)    Answer:   epigastric and periumbilical abd discomfort, ventral hernia    Order Specific Question:   Preferred imaging location?    Answer:   [facility]  . Flu Vaccine QUAD High Dose(Fluad)    Patient instructions: Flu shot today Advanced directive packet provided today.  Return as needed or in 6 months for diabetes follow up  We will check abdominal ultrasound for tender swelling  Follow up plan: Return in about 6 months (around 10/22/2019) for follow up visit.  Ria Bush, MD

## 2019-04-24 NOTE — Assessment & Plan Note (Signed)
Chronic, trig remain high - will increase simvastatin to 40mg  daily. The 10-year ASCVD risk score Mikey Bussing DC Brooke Bonito., et al., 2013) is: 15.2%   Values used to calculate the score:     Age: 68 years     Sex: Female     Is Non-Hispanic African American: No     Diabetic: Yes     Tobacco smoker: No     Systolic Blood Pressure: 0000000 mmHg     Is BP treated: Yes     HDL Cholesterol: 38.9 mg/dL     Total Cholesterol: 147 mg/dL

## 2019-04-24 NOTE — Assessment & Plan Note (Signed)
New - consider updated periph smear next labwork.

## 2019-04-24 NOTE — Assessment & Plan Note (Signed)
Advanced directives: does not have set in place. Packet provided previously and again today. Husband would be HCPOA.  

## 2019-04-24 NOTE — Assessment & Plan Note (Signed)
Noted to R neck today - discussed nystatin use, refilled today.

## 2019-04-24 NOTE — Assessment & Plan Note (Signed)
Chronic, stable. Continue thyroid replacement.  

## 2019-04-24 NOTE — Assessment & Plan Note (Signed)
Preventative protocols reviewed and updated unless pt declined. Discussed healthy diet and lifestyle.  

## 2019-04-24 NOTE — Assessment & Plan Note (Signed)
Chronic, stable. Continue to monitor. Has had normal periph smear and reassuring SPEP in the past.

## 2019-04-24 NOTE — Assessment & Plan Note (Signed)
Ongoing discomfort around presumed ventral hernia. Given ongoing discomfort and limited exam due to body habitus, will check abd [place] as well.

## 2019-04-24 NOTE — Assessment & Plan Note (Signed)
Chronic, stable. Continue current regimen. 

## 2019-04-24 NOTE — Assessment & Plan Note (Signed)

## 2019-04-29 ENCOUNTER — Other Ambulatory Visit: Payer: Self-pay | Admitting: Family Medicine

## 2019-05-02 ENCOUNTER — Ambulatory Visit
Admission: RE | Admit: 2019-05-02 | Discharge: 2019-05-02 | Disposition: A | Discharge status: Home / Self Care | Payer: Medicare HMO | Source: Ambulatory Visit | Attending: Family Medicine | Admitting: Family Medicine

## 2019-05-02 DIAGNOSIS — R1013 Epigastric pain: Secondary | ICD-10-CM | POA: Diagnosis not present

## 2019-05-02 DIAGNOSIS — R109 Unspecified abdominal pain: Secondary | ICD-10-CM

## 2019-05-03 ENCOUNTER — Encounter: Payer: Self-pay | Admitting: Family Medicine

## 2019-05-03 DIAGNOSIS — K760 Fatty (change of) liver, not elsewhere classified: Secondary | ICD-10-CM | POA: Insufficient documentation

## 2019-05-10 ENCOUNTER — Other Ambulatory Visit: Payer: Self-pay | Admitting: Family Medicine

## 2019-05-12 NOTE — Telephone Encounter (Signed)
Last office visit 04/24/2019 for [name].  Last refilled 05/20/2019 for #180 with 1 refill.  Next Appt: 10/22/2019 for 6 month follow up.

## 2019-05-14 ENCOUNTER — Encounter: Payer: Self-pay | Admitting: Family Medicine

## 2019-05-20 ENCOUNTER — Encounter: Payer: Self-pay | Admitting: Family Medicine

## 2019-05-20 DIAGNOSIS — R69 Illness, unspecified: Secondary | ICD-10-CM | POA: Diagnosis not present

## 2019-06-02 ENCOUNTER — Other Ambulatory Visit: Payer: Self-pay

## 2019-06-02 ENCOUNTER — Encounter: Payer: Self-pay | Admitting: Family Medicine

## 2019-06-02 ENCOUNTER — Ambulatory Visit
Admission: RE | Admit: 2019-06-02 | Discharge: 2019-06-02 | Disposition: A | Discharge status: Home / Self Care | Payer: Medicare HMO | Source: Ambulatory Visit | Attending: Family Medicine | Admitting: Family Medicine

## 2019-06-02 DIAGNOSIS — Z1231 Encounter for screening mammogram for malignant neoplasm of breast: Secondary | ICD-10-CM

## 2019-06-03 ENCOUNTER — Other Ambulatory Visit: Payer: Self-pay | Admitting: Family Medicine

## 2019-06-03 DIAGNOSIS — N644 Mastodynia: Secondary | ICD-10-CM

## 2019-06-05 ENCOUNTER — Encounter: Payer: Self-pay | Admitting: Family Medicine

## 2019-06-06 NOTE — Telephone Encounter (Signed)
Dr. Darnell Level, would this be a copy of recent visit or her whole medical records?

## 2019-06-08 NOTE — Telephone Encounter (Signed)
I don't think she'll need all the medical records. I prepared a letter for patient.  plz print out for patient and I can sign.

## 2019-06-09 NOTE — Telephone Encounter (Signed)
See my message regarding letter I've prepared

## 2019-06-10 NOTE — Telephone Encounter (Signed)
Printed letter.  Placed in Dr. Synthia Innocent box.

## 2019-06-10 NOTE — Telephone Encounter (Signed)
Notified pt, via MyChart, the letter is ready to pick up.   [Placed letter at front office.]

## 2019-06-13 ENCOUNTER — Other Ambulatory Visit: Payer: Self-pay | Admitting: Family Medicine

## 2019-06-22 ENCOUNTER — Other Ambulatory Visit: Payer: Self-pay | Admitting: Family Medicine

## 2019-07-01 ENCOUNTER — Encounter: Payer: Self-pay | Admitting: Family Medicine

## 2019-07-01 DIAGNOSIS — E119 Type 2 diabetes mellitus without complications: Secondary | ICD-10-CM | POA: Diagnosis not present

## 2019-07-01 LAB — HM DIABETES EYE EXAM

## 2019-07-07 ENCOUNTER — Other Ambulatory Visit: Payer: Self-pay

## 2019-07-07 ENCOUNTER — Ambulatory Visit
Admission: RE | Admit: 2019-07-07 | Discharge: 2019-07-07 | Disposition: A | Discharge status: Home / Self Care | Payer: Medicare HMO | Source: Ambulatory Visit | Attending: Family Medicine | Admitting: Family Medicine

## 2019-07-07 ENCOUNTER — Encounter: Payer: Self-pay | Admitting: Family Medicine

## 2019-07-07 DIAGNOSIS — N644 Mastodynia: Secondary | ICD-10-CM

## 2019-07-07 DIAGNOSIS — N6489 Other specified disorders of breast: Secondary | ICD-10-CM | POA: Diagnosis not present

## 2019-07-07 DIAGNOSIS — R928 Other abnormal and inconclusive findings on diagnostic imaging of breast: Secondary | ICD-10-CM | POA: Diagnosis not present

## 2019-07-08 ENCOUNTER — Encounter: Payer: Self-pay | Admitting: Family Medicine

## 2019-07-23 ENCOUNTER — Other Ambulatory Visit: Payer: Self-pay | Admitting: Family Medicine

## 2019-07-23 NOTE — Telephone Encounter (Signed)
E-scribed refills.  

## 2019-08-24 ENCOUNTER — Other Ambulatory Visit: Payer: Self-pay | Admitting: Family Medicine

## 2019-08-25 DIAGNOSIS — R69 Illness, unspecified: Secondary | ICD-10-CM | POA: Diagnosis not present

## 2019-09-24 ENCOUNTER — Encounter: Payer: Self-pay | Admitting: Family Medicine

## 2019-09-24 DIAGNOSIS — E118 Type 2 diabetes mellitus with unspecified complications: Secondary | ICD-10-CM

## 2019-09-24 DIAGNOSIS — Z794 Long term (current) use of insulin: Secondary | ICD-10-CM

## 2019-09-26 NOTE — Assessment & Plan Note (Signed)
Glimepiride stopped due to low sugars

## 2019-09-26 NOTE — Telephone Encounter (Signed)
Please call - I recommend she stop glimepiride due to low sugars. Let us know if ongoing trouble with low sugars after stopping this.  What is latest weight? If ongoing weight loss and unexpected, any other symptoms like night sweats, fevers/chills, abd pain, vomiting, diarrhea? If so, may recommend further imaging with CT scan.

## 2019-09-29 ENCOUNTER — Encounter: Payer: Self-pay | Admitting: Family Medicine

## 2019-09-29 NOTE — Telephone Encounter (Signed)
Spoke with pt relaying Dr. Synthia Innocent message.  Pt verbalizes understanding stating she has stopped the glimepiride.  She is experiencing night sweats then chills.  Denies any other sxs including unexpected wt loss.  Says she has been dieting for the past yr and is down to 246 lb.

## 2019-09-29 NOTE — Telephone Encounter (Signed)
plz schedule OV to review night sweats and likely for further labs.

## 2019-09-30 NOTE — Telephone Encounter (Signed)
Spoke with pt scheduling OV on 10/03/19 at 11:15.

## 2019-10-03 ENCOUNTER — Other Ambulatory Visit: Payer: Self-pay

## 2019-10-03 ENCOUNTER — Ambulatory Visit
Admission: RE | Admit: 2019-10-03 | Discharge: 2019-10-03 | Disposition: A | Discharge status: Home / Self Care | Payer: Medicare HMO | Source: Ambulatory Visit | Attending: Family Medicine | Admitting: Family Medicine

## 2019-10-03 ENCOUNTER — Other Ambulatory Visit: Payer: Medicare HMO

## 2019-10-03 ENCOUNTER — Ambulatory Visit (INDEPENDENT_AMBULATORY_CARE_PROVIDER_SITE_OTHER)
Admission: RE | Admit: 2019-10-03 | Discharge: 2019-10-03 | Disposition: A | Discharge status: Home / Self Care | Payer: Medicare HMO | Source: Ambulatory Visit | Attending: Family Medicine | Admitting: Family Medicine

## 2019-10-03 ENCOUNTER — Ambulatory Visit (INDEPENDENT_AMBULATORY_CARE_PROVIDER_SITE_OTHER): Payer: Medicare HMO | Admitting: Family Medicine

## 2019-10-03 ENCOUNTER — Encounter: Payer: Self-pay | Admitting: Family Medicine

## 2019-10-03 VITALS — BP 126/64 | HR 89 | Temp 97.8°F | Ht 61.0 in | Wt 250.3 lb

## 2019-10-03 DIAGNOSIS — Z794 Long term (current) use of insulin: Secondary | ICD-10-CM

## 2019-10-03 DIAGNOSIS — D72829 Elevated white blood cell count, unspecified: Secondary | ICD-10-CM | POA: Diagnosis not present

## 2019-10-03 DIAGNOSIS — R8290 Unspecified abnormal findings in urine: Secondary | ICD-10-CM | POA: Diagnosis not present

## 2019-10-03 DIAGNOSIS — R634 Abnormal weight loss: Secondary | ICD-10-CM

## 2019-10-03 DIAGNOSIS — R519 Headache, unspecified: Secondary | ICD-10-CM | POA: Diagnosis not present

## 2019-10-03 DIAGNOSIS — E039 Hypothyroidism, unspecified: Secondary | ICD-10-CM | POA: Diagnosis not present

## 2019-10-03 DIAGNOSIS — F3178 Bipolar disorder, in full remission, most recent episode mixed: Secondary | ICD-10-CM

## 2019-10-03 DIAGNOSIS — R221 Localized swelling, mass and lump, neck: Secondary | ICD-10-CM

## 2019-10-03 DIAGNOSIS — R0600 Dyspnea, unspecified: Secondary | ICD-10-CM | POA: Diagnosis not present

## 2019-10-03 DIAGNOSIS — R0602 Shortness of breath: Secondary | ICD-10-CM

## 2019-10-03 DIAGNOSIS — D473 Essential (hemorrhagic) thrombocythemia: Secondary | ICD-10-CM

## 2019-10-03 DIAGNOSIS — K219 Gastro-esophageal reflux disease without esophagitis: Secondary | ICD-10-CM

## 2019-10-03 DIAGNOSIS — R69 Illness, unspecified: Secondary | ICD-10-CM | POA: Diagnosis not present

## 2019-10-03 DIAGNOSIS — E118 Type 2 diabetes mellitus with unspecified complications: Secondary | ICD-10-CM

## 2019-10-03 DIAGNOSIS — R8281 Pyuria: Secondary | ICD-10-CM | POA: Diagnosis not present

## 2019-10-03 DIAGNOSIS — D75839 Thrombocytosis, unspecified: Secondary | ICD-10-CM

## 2019-10-03 LAB — CBC WITH DIFFERENTIAL/PLATELET
Basophils Absolute: 0.1 10*3/uL (ref 0.0–0.1)
Basophils Relative: 0.8 % (ref 0.0–3.0)
Eosinophils Absolute: 0.2 10*3/uL (ref 0.0–0.7)
Eosinophils Relative: 1 % (ref 0.0–5.0)
HCT: 37 % (ref 36.0–46.0)
Hemoglobin: 12.4 g/dL (ref 12.0–15.0)
Lymphocytes Relative: 18.6 % (ref 12.0–46.0)
Lymphs Abs: 2.8 10*3/uL (ref 0.7–4.0)
MCHC: 33.5 g/dL (ref 30.0–36.0)
MCV: 88.5 fl (ref 78.0–100.0)
Monocytes Absolute: 1 10*3/uL (ref 0.1–1.0)
Monocytes Relative: 6.9 % (ref 3.0–12.0)
Neutro Abs: 11 10*3/uL — ABNORMAL HIGH (ref 1.4–7.7)
Neutrophils Relative %: 72.7 % (ref 43.0–77.0)
Platelets: 440 10*3/uL — ABNORMAL HIGH (ref 150.0–400.0)
RBC: 4.18 Mil/uL (ref 3.87–5.11)
RDW: 14 % (ref 11.5–15.5)
WBC: 15.1 10*3/uL — ABNORMAL HIGH (ref 4.0–10.5)

## 2019-10-03 LAB — POC URINALSYSI DIPSTICK (AUTOMATED)
Bilirubin, UA: NEGATIVE
Blood, UA: NEGATIVE
Glucose, UA: NEGATIVE
Ketones, UA: NEGATIVE
Nitrite, UA: NEGATIVE
Protein, UA: NEGATIVE
Spec Grav, UA: 1.015 (ref 1.010–1.025)
Urobilinogen, UA: 0.2 E.U./dL
pH, UA: 6 (ref 5.0–8.0)

## 2019-10-03 LAB — COMPREHENSIVE METABOLIC PANEL
ALT: 21 U/L (ref 0–35)
AST: 20 U/L (ref 0–37)
Albumin: 4.4 g/dL (ref 3.5–5.2)
Alkaline Phosphatase: 43 U/L (ref 39–117)
BUN: 18 mg/dL (ref 6–23)
CO2: 29 mEq/L (ref 19–32)
Calcium: 9.8 mg/dL (ref 8.4–10.5)
Chloride: 92 mEq/L — ABNORMAL LOW (ref 96–112)
Creatinine, Ser: 0.84 mg/dL (ref 0.40–1.20)
GFR: 67.62 mL/min (ref >60.00)
Glucose, Bld: 92 mg/dL (ref 70–99)
Potassium: 4 mEq/L (ref 3.5–5.1)
Sodium: 132 mEq/L — ABNORMAL LOW (ref 135–145)
Total Bilirubin: 0.2 mg/dL (ref 0.2–1.2)
Total Protein: 8 g/dL (ref 6.0–8.3)

## 2019-10-03 LAB — TSH: TSH: 4.29 u[IU]/mL (ref 0.35–4.50)

## 2019-10-03 LAB — BRAIN NATRIURETIC PEPTIDE: Pro B Natriuretic peptide (BNP): 29 pg/mL (ref 0.0–100.0)

## 2019-10-03 LAB — POCT GLYCOSYLATED HEMOGLOBIN (HGB A1C): Hemoglobin A1C: 5.7 % — AB (ref 4.0–5.6)

## 2019-10-03 LAB — POCT GLUCOSE (DEVICE FOR HOME USE): POC Glucose: 110 mg/dl — AB (ref 70–99)

## 2019-10-03 LAB — LIPASE: Lipase: 38 U/L (ref 11.0–59.0)

## 2019-10-03 MED ORDER — INSULIN NPH (HUMAN) (ISOPHANE) 100 UNIT/ML ~~LOC~~ SUSP: 40.0000 [IU] | Freq: Every day | SUBCUTANEOUS | 3 refills | Status: DC

## 2019-10-03 NOTE — Patient Instructions (Addendum)
EKG today Urinalysis today.  A1c today 5.7% - decrease insulin NPH to 40 units nightly. Let me know if ongoing weight loss.  Labs and chest xray today.  For tender neck lump - I want to check neck CT to further evaluate this. See Rosaria Ferries to schedule appointment.

## 2019-10-03 NOTE — Progress Notes (Signed)
This visit was conducted in person.  BP 126/64 (BP Location: Left Arm, Patient Position: Sitting, Cuff Size: Large)   Pulse 89   Temp 97.8 F (36.6 C) (Temporal)   Ht 5\' 1"  (1.549 m)   Wt 250 lb 5 oz (113.5 kg)   SpO2 96%   BMI 47.30 kg/m    No data found.  Orthostatic vital signs: lying 126/48, standing 132/62  CC: increasing sweats, malaise Subjective:    Patient ID: [name], female    DOB: 02-May-1951, 69 y.o.   MRN: [id number]  HPI: [name] is a 69 y.o. female presenting on 10/03/2019 for Night Sweats (C/o night sweats, even during the day.  Also, feeling tired, dizzy and faint.  Feels like room is spinning. Sxs started about 2 wks ago.  Tried vit D3 and iron. )   2 wk h/o dizziness, fatigue, lightheaded, presyncope and vertigo, and night and day sweats. Ongoing weight loss noted. No syncope. Worsening R occipital headache, R neck pain for last 6 months. Chronic dyspnea for the last 10 years (since she stopped smoking), however acutely worse recently. Decreased appetite as well as some early satiety noted.   No fever, abd pain, nausea/vomiting, diarrhea, constipation, chest pain/tightness, cough, dysphagia.   She states she's been having intentional weight loss - dieting for the past year = 58 lbs! "600 exercises/day!" - both seated and standing. 2-4k steps per day around the house. Weight peaked at 308 lbs last year.   Marked hypoglycemia - had to stop glimepiride due to this. She continues metformin 1000mg  BID, actos 15mg  and NPH insulin 55u QHS. She has continued having low sugars despite stopping glimepiride.  Lab Results  Component Value Date   HGBA1C 5.7 (A) 10/03/2019    She started iron tablet daily last week as well as vitamin D.   Chronic leukocytosis - periph smear 2016 without immature cells identified.      Relevant past medical, surgical, family and social history reviewed and updated as indicated. Interim medical history since our last visit  reviewed. Allergies and medications reviewed and updated. Outpatient Medications Prior to Visit  Medication Sig Dispense Refill  . aspirin EC 81 MG tablet Take 162 mg by mouth daily.    . carvedilol (COREG) 3.125 MG tablet TAKE 1 TABLET TWICE A DAY  WITH MEALS 180 tablet 3  . famotidine (PEPCID) 20 MG tablet Take 20 mg by mouth daily. Alternates with omeprazole    . hydrochlorothiazide (HYDRODIURIL) 25 MG tablet TAKE 1 TABLET DAILY 90 tablet 3  . INS SYRINGE/NEEDLE 1CC/28G (B-D INS SYR MICROFINE 1CC/28G) 28G X 1/2" 1 ML MISC USE AS DIRECTED 100 each 3  . levothyroxine (SYNTHROID) 100 MCG tablet TAKE 1 TABLET DAILY, AND ON2 DAYS A WEEK, TAKE AN     EXTRA 1/2 TABLET 90 tablet 3  . lisinopril (ZESTRIL) 5 MG tablet TAKE 1 TABLET DAILY 90 tablet 3  . metFORMIN (GLUCOPHAGE) 500 MG tablet Take 2 tablets (1,000 mg total) by mouth 2 (two) times daily with a meal. 360 tablet 1  . Multiple Vitamin (MULTIVITAMIN) tablet Take 1 tablet by mouth daily.    . Naproxen Sodium 220 MG CAPS Take 440 mg by mouth 2 (two) times daily.    Marland Kitchen nystatin cream (MYCOSTATIN) APPLY TO AFFECTED AREA TWICE A DAY 30 g 1  . omeprazole (PRILOSEC) 20 MG capsule Take 1 daily-Alternates with famotidine 90 capsule 2  . ONE TOUCH LANCETS MISC Use to check sugar twice  daily Dx: E11.8 200 each 3  . ONETOUCH ULTRA test strip USE TO CHECK BLOOD SUGAR 2 TIMES   DAILY 200 strip 3  . pioglitazone (ACTOS) 15 MG tablet TAKE 1 TABLET DAILY 90 tablet 1  . simvastatin (ZOCOR) 40 MG tablet Take 1 tablet (40 mg total) by mouth at bedtime. 90 tablet 3  . ziprasidone (GEODON) 40 MG capsule TAKE 1 CAPSULE TWICE DAILY WITH MEALS 180 capsule 1  . NOVOLIN N 100 UNIT/ML injection INJECT 55 UNITS            SUBCUTANEOUSLY AT BEDTIME 50 mL 2   No facility-administered medications prior to visit.     Per HPI unless specifically indicated in ROS section below Review of Systems Objective:    BP 126/64 (BP Location: Left Arm, Patient Position: Sitting,  Cuff Size: Large)   Pulse 89   Temp 97.8 F (36.6 C) (Temporal)   Ht 5\' 1"  (1.549 m)   Wt 250 lb 5 oz (113.5 kg)   SpO2 96%   BMI 47.30 kg/m   Wt Readings from Last 3 Encounters:  10/03/19 250 lb 5 oz (113.5 kg)  04/24/19 258 lb 6 oz (117.2 kg)  12/25/18 267 lb 7 oz (121.3 kg)    Physical Exam Vitals and nursing note reviewed.  Constitutional:      Appearance: Normal appearance. She is obese. She is not ill-appearing.  HENT:     Head: Normocephalic and atraumatic.     Mouth/Throat:     Mouth: Mucous membranes are moist.     Pharynx: Oropharynx is clear. No oropharyngeal exudate or posterior oropharyngeal erythema.  Eyes:     Extraocular Movements: Extraocular movements intact.     Pupils: Pupils are equal, round, and reactive to light.  Neck:      Comments: Tender mass R anterior neck without significant fluctuance Cardiovascular:     Rate and Rhythm: Normal rate and regular rhythm.     Pulses: Normal pulses.     Heart sounds: Normal heart sounds. No murmur.  Pulmonary:     Effort: Pulmonary effort is normal. No respiratory distress.     Breath sounds: Normal breath sounds. No wheezing, rhonchi or rales.  Abdominal:     General: Abdomen is flat. There is no distension.     Palpations: Abdomen is soft. There is no mass.     Tenderness: There is no abdominal tenderness. There is no guarding or rebound.     Hernia: A hernia (ventral) is present.  Musculoskeletal:     Cervical back: Normal range of motion. No erythema.     Right lower leg: No edema.     Left lower leg: No edema.  Skin:    General: Skin is warm and dry.  Neurological:     Mental Status: She is alert.  Psychiatric:        Mood and Affect: Mood normal.        Behavior: Behavior normal.       Results for orders placed or performed in visit on 10/03/19  Urine Culture   Specimen: Urine  Result Value Ref Range   MICRO NUMBER: [id number]    SPECIMEN QUALITY: Adequate    Sample Source URINE    STATUS:  FINAL    Result:      Growth of mixed flora was isolated, suggesting probable contamination. No further testing will be performed. If clinically indicated, recollection using a method to minimize contamination, with prompt transfer to Urine Culture Transport  Tube, is  recommended.   Comprehensive metabolic panel  Result Value Ref Range   Sodium 132 (L) 135 - 145 mEq/L   Potassium 4.0 3.5 - 5.1 mEq/L   Chloride 92 (L) 96 - 112 mEq/L   CO2 29 19 - 32 mEq/L   Glucose, Bld 92 70 - 99 mg/dL   BUN 18 6 - 23 mg/dL   Creatinine, Ser 0.84 0.40 - 1.20 mg/dL   Total Bilirubin 0.2 0.2 - 1.2 mg/dL   Alkaline Phosphatase 43 39 - 117 U/L   AST 20 0 - 37 U/L   ALT 21 0 - 35 U/L   Total Protein 8.0 6.0 - 8.3 g/dL   Albumin 4.4 3.5 - 5.2 g/dL   GFR 67.62 >60.00 mL/min   Calcium 9.8 8.4 - 10.5 mg/dL  CBC with Differential/Platelet  Result Value Ref Range   WBC 15.1 (H) 4.0 - 10.5 K/uL   RBC 4.18 3.87 - 5.11 Mil/uL   Hemoglobin 12.4 12.0 - 15.0 g/dL   HCT 37.0 36.0 - 46.0 %   MCV 88.5 78.0 - 100.0 fl   MCHC 33.5 30.0 - 36.0 g/dL   RDW 14.0 11.5 - 15.5 %   Platelets 440.0 (H) 150.0 - 400.0 K/uL   Neutrophils Relative % 72.7 43.0 - 77.0 %   Lymphocytes Relative 18.6 12.0 - 46.0 %   Monocytes Relative 6.9 3.0 - 12.0 %   Eosinophils Relative 1.0 0.0 - 5.0 %   Basophils Relative 0.8 0.0 - 3.0 %   Neutro Abs 11.0 (H) 1.4 - 7.7 K/uL   Lymphs Abs 2.8 0.7 - 4.0 K/uL   Monocytes Absolute 1.0 0.1 - 1.0 K/uL   Eosinophils Absolute 0.2 0.0 - 0.7 K/uL   Basophils Absolute 0.1 0.0 - 0.1 K/uL  TSH  Result Value Ref Range   TSH 4.29 0.35 - 4.50 uIU/mL  Lipase  Result Value Ref Range   Lipase 38.0 11.0 - 59.0 U/L  Brain natriuretic peptide  Result Value Ref Range   Pro B Natriuretic peptide (BNP) 29.0 0.0 - 100.0 pg/mL  POCT glycosylated hemoglobin (Hb A1C)  Result Value Ref Range   Hemoglobin A1C 5.7 (A) 4.0 - 5.6 %   HbA1c POC (<> result, manual entry)     HbA1c, POC (prediabetic range)      HbA1c, POC (controlled diabetic range)    POCT Glucose (Device for Home Use)  Result Value Ref Range   Glucose Fasting, POC     POC Glucose 110 (A) 70 - 99 mg/dl  POCT Urinalysis Dipstick (Automated)  Result Value Ref Range   Color, UA yellow    Clarity, UA cloudy    Glucose, UA Negative Negative   Bilirubin, UA negative    Ketones, UA negative    Spec Grav, UA 1.015 1.010 - 1.025   Blood, UA negative    pH, UA 6.0 5.0 - 8.0   Protein, UA Negative Negative   Urobilinogen, UA 0.2 0.2 or 1.0 E.U./dL   Nitrite, UA negative    Leukocytes, UA Moderate (2+) (A) Negative   US Soft Tissue Head/Neck CLINICAL DATA:  Right neck mass with weight loss  EXAM: ULTRASOUND OF HEAD/NECK SOFT TISSUES  TECHNIQUE: Ultrasound examination of the head and neck soft tissues was performed in the area of clinical concern.  COMPARISON:  None.  FINDINGS: No cystic or solid mass is visualized in the area of concern. No findings to correspond to the reported lump.  IMPRESSION: No sonographic correlate to  the reported symptomatic lump. CT of the neck with contrast may be helpful if there is continued clinical concern.  Electronically Signed   By: Ulyses Jarred M.D.   On: 10/03/2019 15:46 DG Chest 2 View CLINICAL DATA:  Progressive dyspnea.  Weight loss.  EXAM: CHEST - 2 VIEW  COMPARISON:  October 10, 2016  FINDINGS: The heart size and mediastinal contours are within normal limits. Both lungs are clear. The visualized skeletal structures are unremarkable.  IMPRESSION: No active cardiopulmonary disease.  Electronically Signed   By: Dorise Bullion III M.D   On: 10/03/2019 14:49   EKG - NSR rate 80, normal axis, intervals, diffuse T wave flattening largely unchanged from prior 2018 Assessment & Plan:  This visit occurred during the SARS-CoV-2 public health emergency.  Safety protocols were in place, including screening questions prior to the visit, additional usage of staff PPE, and  extensive cleaning of exam room while observing appropriate contact time as indicated for disinfecting solutions.   Problem List Items Addressed This Visit    Weight loss    Intentional per patient. Check labs today.       Relevant Orders   Comprehensive metabolic panel (Completed)   CBC with Differential/Platelet (Completed)   TSH (Completed)   Lipase (Completed)   Pathologist smear review   DG Chest 2 View (Completed)   POCT glycosylated hemoglobin (Hb A1C) (Completed)   US Soft Tissue Head/Neck (Completed)   POCT Urinalysis Dipstick (Automated) (Completed)   Urine Culture (Completed)   Thrombocytosis (HCC)    Update labs and periph smear.       Obesity, morbid, BMI 40.0-49.9 ([place])    She has been working very hard on weight loss and has lost 58 lbs in the last year! Congratulated, however with other concerning symptoms (sweats, malaise, anorexia), prudent to further evaluate with labs and consider further imaging.       Relevant Medications   insulin NPH Human (NOVOLIN N) 100 UNIT/ML injection   Mass of right side of neck - Primary    Newly noted on exam today - tender swelling at R neck above thyroid. With ongoing weight loss, will check neck US, consider further imaging pending results.       Relevant Orders   US Soft Tissue Head/Neck (Completed)   Leukocytosis    Chronic s/p benign previous workup (periph smear, SPEP). Update CBC and periph smear.       Relevant Orders   Pathologist smear review   US Soft Tissue Head/Neck (Completed)   Hypothyroidism    Update TSH - may need less levothyroxine due to weight loss.       Headache    Right sided headache present for months. Benign exam today - check labwork.       GERD (gastroesophageal reflux disease)    Stable period on current regimen of omeprazole 20mg  alternating with famotidine every other day.       Dyspnea    Chronic, deteriorating despite weight loss. Previously attributed to deconditioning. Check  CXR, EKG, BNP today. If all unrevealing, consider pulm eval.       Relevant Orders   DG Chest 2 View (Completed)   EKG 12-Lead (Completed)   Brain natriuretic peptide (Completed)   Diabetes mellitus type 2, controlled, with complications (HCC)    Marked weight loss - now off glimepiride due to hypoglycemia but persistent lows - actually several of her symptoms could come from ongoing hypoglycemia. A1c remains markedly low 5.7% despite off glimepiride -  will decrease NPH insulin from 55u to 40 units daily. Continue monitoring sugars closely, consider continued titration of antihyperglycemics.       Relevant Medications   insulin NPH Human (NOVOLIN N) 100 UNIT/ML injection   Other Relevant Orders   POCT glycosylated hemoglobin (Hb A1C) (Completed)   POCT Glucose (Device for Home Use) (Completed)   Bipolar disorder ([place])    Stable period on geodon prescribed through our office.  She has had trouble finding covered psychiatrist with her insurance.           Meds ordered this encounter  Medications  . insulin NPH Human (NOVOLIN N) 100 UNIT/ML injection    Sig: Inject 0.4 mLs (40 Units total) into the skin at bedtime.    Dispense:  36 mL    Refill:  3    Note new sig   Orders Placed This Encounter  Procedures  . Urine Culture  . DG Chest 2 View    Standing Status:   Future    Number of Occurrences:   1    Standing Expiration Date:   11/30/2020    Order Specific Question:   Reason for Exam (SYMPTOM  OR DIAGNOSIS REQUIRED)    Answer:   progressive dyspnea, weight loss    Order Specific Question:   Preferred imaging location?    Answer:   [facility]    Order Specific Question:   Radiology Contrast Protocol - do NOT remove file path    Answer:   [email or web address]  . US Soft Tissue Head/Neck    Standing Status:   Future    Number of Occurrences:   1    Standing Expiration Date:   11/30/2020    Order Specific Question:   Reason for  Exam (SYMPTOM  OR DIAGNOSIS REQUIRED)    Answer:   R neck mass, weight loss, night sweats    Order Specific Question:   Preferred imaging location?    Answer:   [facility]  . Comprehensive metabolic panel  . CBC with Differential/Platelet  . TSH  . Lipase  . Pathologist smear review  . Brain natriuretic peptide  . POCT glycosylated hemoglobin (Hb A1C)  . POCT Glucose (Device for Home Use)  . POCT Urinalysis Dipstick (Automated)  . EKG 12-Lead    Patient Instructions  EKG today Urinalysis today.  A1c today 5.7% - decrease insulin NPH to 40 units nightly. Let me know if ongoing weight loss.  Labs and chest xray today.  For tender neck lump - I want to check neck CT to further evaluate this. See Rosaria Ferries to schedule appointment.    Follow up plan: Return if symptoms worsen or fail to improve.  Ria Bush, MD

## 2019-10-04 LAB — URINE CULTURE
MICRO NUMBER:: 10146994
SPECIMEN QUALITY:: ADEQUATE

## 2019-10-05 DIAGNOSIS — R634 Abnormal weight loss: Secondary | ICD-10-CM | POA: Insufficient documentation

## 2019-10-05 NOTE — Assessment & Plan Note (Signed)
Stable period on current regimen of omeprazole 20mg  alternating with famotidine every other day.

## 2019-10-05 NOTE — Assessment & Plan Note (Signed)
Update labs and periph smear.

## 2019-10-05 NOTE — Assessment & Plan Note (Signed)
Intentional per patient. Check labs today.

## 2019-10-05 NOTE — Assessment & Plan Note (Signed)
She has been working very hard on weight loss and has lost 58 lbs in the last year! Congratulated, however with other concerning symptoms (sweats, malaise, anorexia), prudent to further evaluate with labs and consider further imaging.

## 2019-10-05 NOTE — Assessment & Plan Note (Signed)
Stable period on geodon prescribed through our office.  She has had trouble finding covered psychiatrist with her insurance.

## 2019-10-05 NOTE — Assessment & Plan Note (Signed)
Chronic s/p benign previous workup (periph smear, SPEP). Update CBC and periph smear.

## 2019-10-05 NOTE — Assessment & Plan Note (Addendum)
Update TSH - may need less levothyroxine due to weight loss.

## 2019-10-05 NOTE — Assessment & Plan Note (Signed)
Right sided headache present for months. Benign exam today - check labwork.

## 2019-10-05 NOTE — Assessment & Plan Note (Addendum)
Chronic, deteriorating despite weight loss. Previously attributed to deconditioning. Check CXR, EKG, BNP today. If all unrevealing, consider pulm eval.

## 2019-10-05 NOTE — Assessment & Plan Note (Signed)
Marked weight loss - now off glimepiride due to hypoglycemia but persistent lows - actually several of her symptoms could come from ongoing hypoglycemia. A1c remains markedly low 5.7% despite off glimepiride - will decrease NPH insulin from 55u to 40 units daily. Continue monitoring sugars closely, consider continued titration of antihyperglycemics.

## 2019-10-05 NOTE — Assessment & Plan Note (Signed)
Newly noted on exam today - tender swelling at R neck above thyroid. With ongoing weight loss, will check neck US, consider further imaging pending results.

## 2019-10-06 LAB — PATHOLOGIST SMEAR REVIEW

## 2019-10-08 ENCOUNTER — Encounter: Payer: Self-pay | Admitting: Family Medicine

## 2019-10-08 DIAGNOSIS — R221 Localized swelling, mass and lump, neck: Secondary | ICD-10-CM

## 2019-10-08 DIAGNOSIS — D72829 Elevated white blood cell count, unspecified: Secondary | ICD-10-CM

## 2019-10-10 MED ORDER — FERROUS SULFATE 325 (65 FE) MG PO TBEC: 325.0000 mg | DELAYED_RELEASE_TABLET | Freq: Every day | ORAL | Status: DC

## 2019-10-17 ENCOUNTER — Other Ambulatory Visit: Payer: Medicare HMO

## 2019-10-18 ENCOUNTER — Encounter: Payer: Self-pay | Admitting: Family Medicine

## 2019-10-18 ENCOUNTER — Ambulatory Visit: Payer: Medicare HMO | Attending: Internal Medicine

## 2019-10-18 DIAGNOSIS — Z23 Encounter for immunization: Secondary | ICD-10-CM

## 2019-10-18 NOTE — Progress Notes (Signed)
   Covid-19 Vaccination Clinic  Name:  [name]    MRN: [id number] DOB: [birth year]  10/18/2019  [name] was observed post Covid-19 immunization for 15 minutes without incidence. She was provided with Vaccine Information Sheet and instruction to access the V-Safe system.   [name] was instructed to call 911 with any severe reactions post vaccine: Marland Kitchen Difficulty breathing  . Swelling of your face and throat  . A fast heartbeat  . A bad rash all over your body  . Dizziness and weakness    Immunizations Administered    Name Date Dose VIS Date Route   Moderna COVID-19 Vaccine 10/18/2019 12:44 PM 0.5 mL 07/22/2019 Intramuscular   Manufacturer: Moderna   Lot: [id number]   [id number]

## 2019-10-20 ENCOUNTER — Inpatient Hospital Stay: Payer: Medicare HMO

## 2019-10-20 ENCOUNTER — Encounter: Payer: Self-pay | Admitting: Internal Medicine

## 2019-10-20 ENCOUNTER — Other Ambulatory Visit: Payer: Self-pay | Admitting: Family Medicine

## 2019-10-20 ENCOUNTER — Inpatient Hospital Stay: Payer: Medicare HMO | Attending: Internal Medicine | Admitting: Internal Medicine

## 2019-10-20 ENCOUNTER — Other Ambulatory Visit: Payer: Self-pay

## 2019-10-20 ENCOUNTER — Other Ambulatory Visit (INDEPENDENT_AMBULATORY_CARE_PROVIDER_SITE_OTHER): Payer: Medicare HMO

## 2019-10-20 VITALS — BP 134/74 | HR 87 | Temp 96.6°F | Resp 22 | Ht 61.0 in | Wt 247.0 lb

## 2019-10-20 DIAGNOSIS — G4733 Obstructive sleep apnea (adult) (pediatric): Secondary | ICD-10-CM | POA: Diagnosis not present

## 2019-10-20 DIAGNOSIS — R634 Abnormal weight loss: Secondary | ICD-10-CM | POA: Diagnosis not present

## 2019-10-20 DIAGNOSIS — Z7982 Long term (current) use of aspirin: Secondary | ICD-10-CM | POA: Insufficient documentation

## 2019-10-20 DIAGNOSIS — D72829 Elevated white blood cell count, unspecified: Secondary | ICD-10-CM | POA: Insufficient documentation

## 2019-10-20 DIAGNOSIS — D473 Essential (hemorrhagic) thrombocythemia: Secondary | ICD-10-CM | POA: Diagnosis present

## 2019-10-20 DIAGNOSIS — M1990 Unspecified osteoarthritis, unspecified site: Secondary | ICD-10-CM | POA: Insufficient documentation

## 2019-10-20 DIAGNOSIS — E039 Hypothyroidism, unspecified: Secondary | ICD-10-CM | POA: Insufficient documentation

## 2019-10-20 DIAGNOSIS — M797 Fibromyalgia: Secondary | ICD-10-CM | POA: Insufficient documentation

## 2019-10-20 DIAGNOSIS — Z87891 Personal history of nicotine dependence: Secondary | ICD-10-CM | POA: Insufficient documentation

## 2019-10-20 DIAGNOSIS — Z803 Family history of malignant neoplasm of breast: Secondary | ICD-10-CM | POA: Diagnosis not present

## 2019-10-20 DIAGNOSIS — I2510 Atherosclerotic heart disease of native coronary artery without angina pectoris: Secondary | ICD-10-CM | POA: Diagnosis not present

## 2019-10-20 DIAGNOSIS — R6883 Chills (without fever): Secondary | ICD-10-CM | POA: Insufficient documentation

## 2019-10-20 DIAGNOSIS — D729 Disorder of white blood cells, unspecified: Secondary | ICD-10-CM

## 2019-10-20 DIAGNOSIS — Z7984 Long term (current) use of oral hypoglycemic drugs: Secondary | ICD-10-CM | POA: Diagnosis not present

## 2019-10-20 DIAGNOSIS — Z79899 Other long term (current) drug therapy: Secondary | ICD-10-CM | POA: Diagnosis not present

## 2019-10-20 DIAGNOSIS — Z1211 Encounter for screening for malignant neoplasm of colon: Secondary | ICD-10-CM

## 2019-10-20 DIAGNOSIS — K219 Gastro-esophageal reflux disease without esophagitis: Secondary | ICD-10-CM | POA: Diagnosis not present

## 2019-10-20 DIAGNOSIS — K760 Fatty (change of) liver, not elsewhere classified: Secondary | ICD-10-CM | POA: Diagnosis not present

## 2019-10-20 DIAGNOSIS — R61 Generalized hyperhidrosis: Secondary | ICD-10-CM | POA: Diagnosis not present

## 2019-10-20 DIAGNOSIS — E785 Hyperlipidemia, unspecified: Secondary | ICD-10-CM | POA: Insufficient documentation

## 2019-10-20 DIAGNOSIS — F319 Bipolar disorder, unspecified: Secondary | ICD-10-CM | POA: Diagnosis not present

## 2019-10-20 DIAGNOSIS — E1142 Type 2 diabetes mellitus with diabetic polyneuropathy: Secondary | ICD-10-CM | POA: Diagnosis not present

## 2019-10-20 DIAGNOSIS — I10 Essential (primary) hypertension: Secondary | ICD-10-CM | POA: Diagnosis not present

## 2019-10-20 LAB — COMPREHENSIVE METABOLIC PANEL
ALT: 19 U/L (ref 0–44)
AST: 21 U/L (ref 15–41)
Albumin: 4.3 g/dL (ref 3.5–5.0)
Alkaline Phosphatase: 42 U/L (ref 38–126)
Anion gap: 16 — ABNORMAL HIGH (ref 5–15)
BUN: 19 mg/dL (ref 8–23)
CO2: 24 mmol/L (ref 22–32)
Calcium: 9.6 mg/dL (ref 8.9–10.3)
Chloride: 93 mmol/L — ABNORMAL LOW (ref 98–111)
Creatinine, Ser: 0.79 mg/dL (ref 0.44–1.00)
GFR calc Af Amer: >60 mL/min (ref >60)
GFR calc non Af Amer: >60 mL/min (ref >60)
Glucose, Bld: 112 mg/dL — ABNORMAL HIGH (ref 70–99)
Potassium: 3.7 mmol/L (ref 3.5–5.1)
Sodium: 133 mmol/L — ABNORMAL LOW (ref 135–145)
Total Bilirubin: 0.4 mg/dL (ref 0.3–1.2)
Total Protein: 8 g/dL (ref 6.5–8.1)

## 2019-10-20 LAB — CBC WITH DIFFERENTIAL/PLATELET
Abs Immature Granulocytes: 0.08 10*3/uL — ABNORMAL HIGH (ref 0.00–0.07)
Basophils Absolute: 0.1 10*3/uL (ref 0.0–0.1)
Basophils Relative: 1 %
Eosinophils Absolute: 0 10*3/uL (ref 0.0–0.5)
Eosinophils Relative: 0 %
HCT: 37 % (ref 36.0–46.0)
Hemoglobin: 11.9 g/dL — ABNORMAL LOW (ref 12.0–15.0)
Immature Granulocytes: 1 %
Lymphocytes Relative: 19 %
Lymphs Abs: 3 10*3/uL (ref 0.7–4.0)
MCH: 29 pg (ref 26.0–34.0)
MCHC: 32.2 g/dL (ref 30.0–36.0)
MCV: 90 fL (ref 80.0–100.0)
Monocytes Absolute: 1 10*3/uL (ref 0.1–1.0)
Monocytes Relative: 7 %
Neutro Abs: 11.4 10*3/uL — ABNORMAL HIGH (ref 1.7–7.7)
Neutrophils Relative %: 72 %
Platelets: 429 10*3/uL — ABNORMAL HIGH (ref 150–400)
RBC: 4.11 MIL/uL (ref 3.87–5.11)
RDW: 14 % (ref 11.5–15.5)
WBC: 15.6 10*3/uL — ABNORMAL HIGH (ref 4.0–10.5)
nRBC: 0 % (ref 0.0–0.2)

## 2019-10-20 LAB — LACTATE DEHYDROGENASE: LDH: 106 U/L (ref 98–192)

## 2019-10-20 LAB — TECHNOLOGIST SMEAR REVIEW
Plt Morphology: ADEQUATE
RBC Morphology: NORMAL

## 2019-10-20 LAB — FECAL OCCULT BLOOD, IMMUNOCHEMICAL: Fecal Occult Bld: NEGATIVE

## 2019-10-20 LAB — C-REACTIVE PROTEIN: CRP: 0.7 mg/dL (ref <1.0)

## 2019-10-20 NOTE — Assessment & Plan Note (Addendum)
#  Chronic neutrophilia-total white count on 15,000/mild thrombocytosis platelets 4 40,000 normal hemoglobin.  Unclear etiology-benign versus malignant.  Suspect benign based on clinically.  Rule out any malignant causes.-Check BCR ABL/LDH/JAK2/Calr/MPL mutations; CBC CMP CRP.   #Abnormal weight loss-given the constitutional symptoms of night sweats/chills-I think is reasonable to get a CT scan chest and pelvis for further evaluation.  Patient awaiting CT of the neck through PCP tomorrow.  #Peripheral neuropathy-secondary diabetes stable.  # Hx of smoking-quit 10 years ago.  Patient likely candidate for lung cancer screening program.  Will discuss at next visit.  DISPOSITION: # labs today # Follow up in 2 weeks- MD [pt pref]-no labs; CT scans-C/A/P- prior.  Thank you Dr.Gutierrez  for allowing me to participate in the care of your pleasant patient. Please do not hesitate to contact me with questions or concerns in the interim.

## 2019-10-20 NOTE — Progress Notes (Signed)
[place] NOTE  Patient Care Team: Ria Bush, MD as PCP - General (Family Medicine) Bryson Ha, OD as Consulting Physician (Optometry)  CHIEF COMPLAINTS/PURPOSE OF CONSULTATION: Thrombocytosis/ Leucocytosis  HEMATOLOGY HISTORY  # LEUCOCYTOSIS-CHRONIC [15,000/neutrophilia]; mild thrombocytosis for 440,000; normal hemoglobin.   #Abnormal weight loss/night sweats   HISTORY OF PRESENTING ILLNESS:  [name] 69 y.o.  female pleasant patient was been referred to [place] for further evaluation of elevated platelets/white count which was incidentally found on blood work.  Patient states that she has lost over 60 pounds over the last 1 year.  Patient states that she has been trying to lose weight because of diabetes.  Also complains of night sweats profuse; and also intermittent episodes or chills.  States that these episodes have been attributed to intermittent low blood sugars.   Patient denies any history of blood clots.  No history of strokes.  Admits to tingling and numbness extremities.  Denies any recurrent infections.   US-2020-hepato-steatosis no splenomegaly.   Review of Systems  Constitutional: Positive for weight loss. Negative for chills, diaphoresis, fever and malaise/fatigue.  HENT: Negative for nosebleeds and sore throat.   Eyes: Negative for double vision.  Respiratory: Negative for cough, hemoptysis, sputum production, shortness of breath and wheezing.   Cardiovascular: Negative for chest pain, palpitations, orthopnea and leg swelling.  Gastrointestinal: Negative for abdominal pain, blood in stool, constipation, diarrhea, heartburn, melena, nausea and vomiting.  Genitourinary: Negative for dysuria, frequency and urgency.  Musculoskeletal: Positive for back pain and joint pain.  Skin: Negative.  Negative for itching and rash.  Neurological: Positive for tingling. Negative for dizziness, focal weakness, weakness and headaches.   Endo/Heme/Allergies: Does not bruise/bleed easily.  Psychiatric/Behavioral: Negative for depression. The patient is not nervous/anxious and does not have insomnia.      MEDICAL HISTORY:  Past Medical History:  Diagnosis Date  . Anxiety   . Bipolar disorder ([place])    has stopped seeing psychiatrist  . Chronic bronchitis   . Coronary artery disease, non-occlusive   . Dyspnea 2012   s/p pulm/cards w/u WNL, thought due to obesity/deconditioning  . Fibromyalgia   . GERD (gastroesophageal reflux disease)   . History of chicken pox   . HLD (hyperlipidemia)   . Hypertension   . Hypothyroidism   . Migraine   . OSA (obstructive sleep apnea) 03/16/2011  . T2DM (type 2 diabetes mellitus) ([place]) 2012   DM education 06/2011  . Urinary incontinence     SURGICAL HISTORY: Past Surgical History:  Procedure Laterality Date  . BREAST CYST ASPIRATION Right 2003  . cardiac catherization  03/2011   x3 [facility]), mild nonobstructive CAD  . COLONOSCOPY  12/2011   hyperplastic polyp x2,. diverticulosis, rec rpt 10 yrs  . ESOPHAGOGASTRODUODENOSCOPY  2006  . KNEE SURGERY  2006   left, torn meniscus  . TOTAL ABDOMINAL HYSTERECTOMY  2004   fibroids, cervix remained  . TOTAL KNEE ARTHROPLASTY Left 08/2012   [name], [facility]    SOCIAL HISTORY: Social History   Socioeconomic History  . Marital status: Married    Spouse name: Not on file  . Number of children: Not on file  . Years of education: Not on file  . Highest education level: Not on file  Occupational History  . Occupation: disabled    Employer: OTHER  Tobacco Use  . Smoking status: Former Smoker    Packs/day: 1.00    Years: 43.00    Pack years: 43.00  Types: Cigarettes    Quit date: 08/21/2009    Years since quitting: 10.1  . Smokeless tobacco: Never Used  Substance and Sexual Activity  . Alcohol use: No    Alcohol/week: 0.0 standard drinks  . Drug use: No  . Sexual activity: Yes    Partners: Male  Other Topics  Concern  . Not on file  Social History Narrative   Caffeine: 2-3 coffee/am, 1 cup tea in afternoon; Lives with husband, 1 dog, 2 cats; Occupation: disability since 2009 for bipolar, previously worked at [employer]; Diet: not many fruits, good vegetables, good amt water.      Smoker 43 years; quit in 2011. No alcohol. Lives in [place]; with husband.    Social Determinants of Health   Financial Resource Strain:   . Difficulty of Paying Living Expenses: Not on file  Food Insecurity:   . Worried About Charity fundraiser in the Last Year: Not on file  . Ran Out of Food in the Last Year: Not on file  Transportation Needs:   . Lack of Transportation (Medical): Not on file  . Lack of Transportation (Non-Medical): Not on file  Physical Activity:   . Days of Exercise per Week: Not on file  . Minutes of Exercise per Session: Not on file  Stress:   . Feeling of Stress : Not on file  Social Connections:   . Frequency of Communication with Friends and Family: Not on file  . Frequency of Social Gatherings with Friends and Family: Not on file  . Attends Religious Services: Not on file  . Active Member of Clubs or Organizations: Not on file  . Attends Archivist Meetings: Not on file  . Marital Status: Not on file  Intimate Partner Violence:   . Fear of Current or Ex-Partner: Not on file  . Emotionally Abused: Not on file  . Physically Abused: Not on file  . Sexually Abused: Not on file    FAMILY HISTORY: Family History  Problem Relation Age of Onset  . Asthma Maternal Uncle   . Cancer Maternal Uncle        colon  . Aneurysm Maternal Uncle   . Colon cancer Maternal Uncle 86  . Hypertension Mother   . Stroke Mother   . Arthritis Mother   . COPD Sister   . Acute lymphoblastic leukemia Cousin 3       died at age 6  . Cancer Father 19       brain tumor  . Diabetes Father   . Thyroid disease Father   . Cancer Maternal Aunt 4       breast  . Breast cancer Maternal Aunt   .  Cancer Paternal Uncle        prostate  . Coronary artery disease Neg Hx   . Stomach cancer Neg Hx     ALLERGIES:  is allergic to crestor [rosuvastatin]; lipitor [atorvastatin]; lithium; and quetiapine.  MEDICATIONS:  Current Outpatient Medications  Medication Sig Dispense Refill  . aspirin EC 81 MG tablet Take 162 mg by mouth daily.    . carvedilol (COREG) 3.125 MG tablet TAKE 1 TABLET TWICE A DAY  WITH MEALS 180 tablet 3  . famotidine (PEPCID) 20 MG tablet Take 20 mg by mouth daily. Alternates with omeprazole    . ferrous sulfate 325 (65 FE) MG EC tablet Take 1 tablet (325 mg total) by mouth daily with breakfast.    . hydrochlorothiazide (HYDRODIURIL) 25 MG tablet TAKE 1  TABLET DAILY 90 tablet 3  . INS SYRINGE/NEEDLE 1CC/28G (B-D INS SYR MICROFINE 1CC/28G) 28G X 1/2" 1 ML MISC USE AS DIRECTED 100 each 3  . insulin NPH Human (NOVOLIN N) 100 UNIT/ML injection Inject 0.4 mLs (40 Units total) into the skin at bedtime. 36 mL 3  . levothyroxine (SYNTHROID) 100 MCG tablet TAKE 1 TABLET DAILY, AND ON2 DAYS A WEEK, TAKE AN     EXTRA 1/2 TABLET 90 tablet 3  . lisinopril (ZESTRIL) 5 MG tablet TAKE 1 TABLET DAILY 90 tablet 3  . metFORMIN (GLUCOPHAGE) 500 MG tablet Take 2 tablets (1,000 mg total) by mouth 2 (two) times daily with a meal. 360 tablet 1  . Multiple Vitamin (MULTIVITAMIN) tablet Take 1 tablet by mouth daily.    . Naproxen Sodium 220 MG CAPS Take 440 mg by mouth 2 (two) times daily.    Marland Kitchen nystatin cream (MYCOSTATIN) APPLY TO AFFECTED AREA TWICE A DAY 30 g 1  . omeprazole (PRILOSEC) 20 MG capsule Take 1 daily-Alternates with famotidine 90 capsule 2  . ONE TOUCH LANCETS MISC Use to check sugar twice daily Dx: E11.8 200 each 3  . ONETOUCH ULTRA test strip USE TO CHECK BLOOD SUGAR 2 TIMES   DAILY 200 strip 3  . pioglitazone (ACTOS) 15 MG tablet TAKE 1 TABLET DAILY 90 tablet 1  . simvastatin (ZOCOR) 40 MG tablet Take 1 tablet (40 mg total) by mouth at bedtime. 90 tablet 3  . ziprasidone  (GEODON) 40 MG capsule TAKE 1 CAPSULE TWICE DAILY WITH MEALS 180 capsule 1   No current facility-administered medications for this visit.     PHYSICAL EXAMINATION:   Vitals:   10/20/19 1112  BP: 134/74  Pulse: 87  Resp: (!) 22  Temp: (!) 96.6 F (35.9 C)  SpO2: 98%   Filed Weights   10/20/19 1113  Weight: 247 lb (112 kg)    Physical Exam  Constitutional: She is oriented to person, place, and time and well-developed, well-nourished, and in no distress.  Obese.  In a wheelchair secondary arthritis walks with a cane in general.  Alone.  HENT:  Head: Normocephalic and atraumatic.  Mouth/Throat: Oropharynx is clear and moist. No oropharyngeal exudate.  Eyes: Pupils are equal, round, and reactive to light.  Cardiovascular: Normal rate and regular rhythm.  Pulmonary/Chest: Effort normal and breath sounds normal. No respiratory distress. She has no wheezes.  Abdominal: Soft. Bowel sounds are normal. She exhibits no distension and no mass. There is no abdominal tenderness. There is no rebound and no guarding.  Musculoskeletal:        General: No tenderness or edema. Normal range of motion.     Cervical back: Normal range of motion and neck supple.  Neurological: She is alert and oriented to person, place, and time.  Skin: Skin is warm.  Psychiatric: Affect normal.     LABORATORY DATA:  I have reviewed the data as listed Lab Results  Component Value Date   WBC 15.6 (H) 10/20/2019   HGB 11.9 (L) 10/20/2019   HCT 37.0 10/20/2019   MCV 90.0 10/20/2019   PLT 429 (H) 10/20/2019   Recent Labs    04/17/19 0851 10/03/19 1214 10/20/19 1206  NA 131* 132* 133*  K 3.9 4.0 3.7  CL 89* 92* 93*  CO2 29 29 24   GLUCOSE 91 92 112*  BUN 15 18 19   CREATININE 0.79 0.84 0.79  CALCIUM 10.0 9.8 9.6  GFRNONAA  --   --  >60  GFRAA  --   --  >60  PROT 8.0 8.0 8.0  ALBUMIN 4.4 4.4 4.3  AST 18 20 21   ALT 17 21 19   ALKPHOS 51 43 42  BILITOT 0.2 0.2 0.4     DG Chest 2 View  Result  Date: 10/03/2019 CLINICAL DATA:  Progressive dyspnea.  Weight loss. EXAM: CHEST - 2 VIEW COMPARISON:  October 10, 2016 FINDINGS: The heart size and mediastinal contours are within normal limits. Both lungs are clear. The visualized skeletal structures are unremarkable. IMPRESSION: No active cardiopulmonary disease. Electronically Signed   By: Dorise Bullion III M.D   On: 10/03/2019 14:49   US Soft Tissue Head/Neck  Result Date: 10/03/2019 CLINICAL DATA:  Right neck mass with weight loss EXAM: ULTRASOUND OF HEAD/NECK SOFT TISSUES TECHNIQUE: Ultrasound examination of the head and neck soft tissues was performed in the area of clinical concern. COMPARISON:  None. FINDINGS: No cystic or solid mass is visualized in the area of concern. No findings to correspond to the reported lump. IMPRESSION: No sonographic correlate to the reported symptomatic lump. CT of the neck with contrast may be helpful if there is continued clinical concern. Electronically Signed   By: Ulyses Jarred M.D.   On: 10/03/2019 15:46    ASSESSMENT & PLAN:   Neutrophilia #Chronic neutrophilia-total white count on 15,000/mild thrombocytosis platelets 4 40,000 normal hemoglobin.  Unclear etiology-benign versus malignant.  Suspect benign based on clinically.  Rule out any malignant causes.-Check BCR ABL/LDH/JAK2/Calr/MPL mutations; CBC CMP CRP.   #Abnormal weight loss-given the constitutional symptoms of night sweats/chills-I think is reasonable to get a CT scan chest and pelvis for further evaluation.  Patient awaiting CT of the neck through PCP tomorrow.  #Peripheral neuropathy-secondary diabetes stable.  # Hx of smoking-quit 10 years ago.  Patient likely candidate for lung cancer screening program.  Will discuss at next visit.  DISPOSITION: # labs today # Follow up in 2 weeks- MD [pt pref]-no labs; CT scans-C/A/P- prior.  Thank you Dr.Gutierrez  for allowing me to participate in the care of your pleasant patient. Please do not  hesitate to contact me with questions or concerns in the interim.      Cammie Sickle, MD 10/20/2019 1:14 PM

## 2019-10-21 ENCOUNTER — Other Ambulatory Visit: Payer: Self-pay

## 2019-10-21 ENCOUNTER — Ambulatory Visit
Admission: RE | Admit: 2019-10-21 | Discharge: 2019-10-21 | Disposition: A | Discharge status: Home / Self Care | Payer: Medicare HMO | Source: Ambulatory Visit | Attending: Family Medicine | Admitting: Family Medicine

## 2019-10-21 DIAGNOSIS — R221 Localized swelling, mass and lump, neck: Secondary | ICD-10-CM | POA: Insufficient documentation

## 2019-10-21 MED ORDER — IOHEXOL 300 MG/ML  SOLN
75.0000 mL | Freq: Once | INTRAMUSCULAR | Status: AC | PRN
  Administered 2019-10-21: 75 mL via INTRAVENOUS

## 2019-10-22 ENCOUNTER — Encounter: Payer: Self-pay | Admitting: Family Medicine

## 2019-10-22 ENCOUNTER — Ambulatory Visit (INDEPENDENT_AMBULATORY_CARE_PROVIDER_SITE_OTHER): Payer: Medicare HMO | Admitting: Family Medicine

## 2019-10-22 VITALS — BP 120/64 | HR 90 | Temp 97.9°F | Ht 61.0 in | Wt 247.4 lb

## 2019-10-22 DIAGNOSIS — M8949 Other hypertrophic osteoarthropathy, multiple sites: Secondary | ICD-10-CM

## 2019-10-22 DIAGNOSIS — M25561 Pain in right knee: Secondary | ICD-10-CM

## 2019-10-22 DIAGNOSIS — M797 Fibromyalgia: Secondary | ICD-10-CM | POA: Diagnosis not present

## 2019-10-22 DIAGNOSIS — M5090 Cervical disc disorder, unspecified, unspecified cervical region: Secondary | ICD-10-CM | POA: Diagnosis not present

## 2019-10-22 DIAGNOSIS — R634 Abnormal weight loss: Secondary | ICD-10-CM

## 2019-10-22 DIAGNOSIS — G8929 Other chronic pain: Secondary | ICD-10-CM | POA: Diagnosis not present

## 2019-10-22 DIAGNOSIS — R221 Localized swelling, mass and lump, neck: Secondary | ICD-10-CM

## 2019-10-22 DIAGNOSIS — D649 Anemia, unspecified: Secondary | ICD-10-CM

## 2019-10-22 DIAGNOSIS — M25552 Pain in left hip: Secondary | ICD-10-CM | POA: Diagnosis not present

## 2019-10-22 DIAGNOSIS — M159 Polyosteoarthritis, unspecified: Secondary | ICD-10-CM

## 2019-10-22 MED ORDER — TRAMADOL HCL 50 MG PO TABS: 50.0000 mg | ORAL_TABLET | Freq: Two times a day (BID) | ORAL | 0 refills | Status: DC | PRN

## 2019-10-22 NOTE — Patient Instructions (Addendum)
Your xray and recent CT scan showed severe neck joint arthritis changes which are causing your neck pain - we will refer you to spine clinic to discuss steroid injection into the spine.  Call to schedule appointment with ortho for new evaluation of hips and knees.  Take tramadol as needed for breakthrough pain after naprosyn and tylenol.  Consider turmeric or osteo bi flex (glucosamine) for joints.

## 2019-10-22 NOTE — Progress Notes (Signed)
This visit was conducted in person.  BP 120/64 (BP Location: Left Arm, Patient Position: Sitting, Cuff Size: Large)   Pulse 90   Temp 97.9 F (36.6 C) (Temporal)   Ht 5\' 1"  (1.549 m)   Wt 247 lb 7 oz (112.2 kg)   SpO2 98%   BMI 46.75 kg/m    CC: 6 mo f/u visit Subjective:    Patient ID: [name], female    DOB: 05/24/1951, 69 y.o.   MRN: [id number]  HPI: [name] is a 69 y.o. female presenting on 10/22/2019 for Follow-up (Here for 6 mo f/u.)   See prior notes for details. Saw heme this week - pending CT abd/pelvis/chest to further evaluate abnormal weight loss with constitutional symptoms. Appreciate their input.   Recent neck US and CT reassuringly normal.  Recent iFOB negative.  She is taking iron supplement daily.   She continues hurting today - L hip, both knees, lower back, R occipital head and neck. Takes both tylenol 1000mg  and 440mg  aleve twice daily. She was previously on tramadol for neck pain with benefit. Orthopedist retired - Dr Margaretmary Eddy at [facility].   Staying in bed all day due to ongoing pain      Relevant past medical, surgical, family and social history reviewed and updated as indicated. Interim medical history since our last visit reviewed. Allergies and medications reviewed and updated. Outpatient Medications Prior to Visit  Medication Sig Dispense Refill  . aspirin EC 81 MG tablet Take 162 mg by mouth daily.    . carvedilol (COREG) 3.125 MG tablet TAKE 1 TABLET TWICE A DAY  WITH MEALS 180 tablet 3  . famotidine (PEPCID) 20 MG tablet Take 20 mg by mouth daily. Alternates with omeprazole    . ferrous sulfate 325 (65 FE) MG EC tablet Take 1 tablet (325 mg total) by mouth daily with breakfast.    . hydrochlorothiazide (HYDRODIURIL) 25 MG tablet TAKE 1 TABLET DAILY 90 tablet 3  . INS SYRINGE/NEEDLE 1CC/28G (B-D INS SYR MICROFINE 1CC/28G) 28G X 1/2" 1 ML MISC USE AS DIRECTED 100 each 3  . insulin NPH Human (NOVOLIN N) 100 UNIT/ML injection  Inject 0.4 mLs (40 Units total) into the skin at bedtime. 36 mL 3  . levothyroxine (SYNTHROID) 100 MCG tablet TAKE 1 TABLET DAILY, AND ON2 DAYS A WEEK, TAKE AN     EXTRA 1/2 TABLET 90 tablet 3  . lisinopril (ZESTRIL) 5 MG tablet TAKE 1 TABLET DAILY 90 tablet 3  . metFORMIN (GLUCOPHAGE) 500 MG tablet Take 2 tablets (1,000 mg total) by mouth 2 (two) times daily with a meal. 360 tablet 1  . Multiple Vitamin (MULTIVITAMIN) tablet Take 1 tablet by mouth daily.    . Naproxen Sodium 220 MG CAPS Take 440 mg by mouth 2 (two) times daily.    Marland Kitchen nystatin cream (MYCOSTATIN) APPLY TO AFFECTED AREA TWICE A DAY 30 g 1  . omeprazole (PRILOSEC) 20 MG capsule Take 1 daily-Alternates with famotidine 90 capsule 2  . ONE TOUCH LANCETS MISC Use to check sugar twice daily Dx: E11.8 200 each 3  . ONETOUCH ULTRA test strip USE TO CHECK BLOOD SUGAR 2 TIMES   DAILY 200 strip 3  . pioglitazone (ACTOS) 15 MG tablet TAKE 1 TABLET DAILY 90 tablet 1  . simvastatin (ZOCOR) 40 MG tablet Take 1 tablet (40 mg total) by mouth at bedtime. 90 tablet 3  . ziprasidone (GEODON) 40 MG capsule TAKE 1 CAPSULE TWICE DAILY WITH MEALS  180 capsule 1   No facility-administered medications prior to visit.     Per HPI unless specifically indicated in ROS section below Review of Systems Objective:    BP 120/64 (BP Location: Left Arm, Patient Position: Sitting, Cuff Size: Large)   Pulse 90   Temp 97.9 F (36.6 C) (Temporal)   Ht 5\' 1"  (1.549 m)   Wt 247 lb 7 oz (112.2 kg)   SpO2 98%   BMI 46.75 kg/m   Wt Readings from Last 3 Encounters:  10/22/19 247 lb 7 oz (112.2 kg)  10/20/19 247 lb (112 kg)  10/03/19 250 lb 5 oz (113.5 kg)    Physical Exam Vitals and nursing note reviewed.  Constitutional:      Appearance: Normal appearance. She is obese. She is not ill-appearing.     Comments: Walks with cane  Neurological:     Mental Status: She is alert.     Comments: Antalgic gait       Results for orders placed or performed in visit  on 10/20/19  C-reactive protein  Result Value Ref Range   CRP 0.7 <1.0 mg/dL  Comprehensive metabolic panel  Result Value Ref Range   Sodium 133 (L) 135 - 145 mmol/L   Potassium 3.7 3.5 - 5.1 mmol/L   Chloride 93 (L) 98 - 111 mmol/L   CO2 24 22 - 32 mmol/L   Glucose, Bld 112 (H) 70 - 99 mg/dL   BUN 19 8 - 23 mg/dL   Creatinine, Ser 0.79 0.44 - 1.00 mg/dL   Calcium 9.6 8.9 - 10.3 mg/dL   Total Protein 8.0 6.5 - 8.1 g/dL   Albumin 4.3 3.5 - 5.0 g/dL   AST 21 15 - 41 U/L   ALT 19 0 - 44 U/L   Alkaline Phosphatase 42 38 - 126 U/L   Total Bilirubin 0.4 0.3 - 1.2 mg/dL   GFR calc non Af Amer >60 >60 mL/min   GFR calc Af Amer >60 >60 mL/min   Anion gap 16 (H) 5 - 15  CBC with Differential/Platelet  Result Value Ref Range   WBC 15.6 (H) 4.0 - 10.5 K/uL   RBC 4.11 3.87 - 5.11 MIL/uL   Hemoglobin 11.9 (L) 12.0 - 15.0 g/dL   HCT 37.0 36.0 - 46.0 %   MCV 90.0 80.0 - 100.0 fL   MCH 29.0 26.0 - 34.0 pg   MCHC 32.2 30.0 - 36.0 g/dL   RDW 14.0 11.5 - 15.5 %   Platelets 429 (H) 150 - 400 K/uL   nRBC 0.0 0.0 - 0.2 %   Neutrophils Relative % 72 %   Neutro Abs 11.4 (H) 1.7 - 7.7 K/uL   Lymphocytes Relative 19 %   Lymphs Abs 3.0 0.7 - 4.0 K/uL   Monocytes Relative 7 %   Monocytes Absolute 1.0 0.1 - 1.0 K/uL   Eosinophils Relative 0 %   Eosinophils Absolute 0.0 0.0 - 0.5 K/uL   Basophils Relative 1 %   Basophils Absolute 0.1 0.0 - 0.1 K/uL   Immature Granulocytes 1 %   Abs Immature Granulocytes 0.08 (H) 0.00 - 0.07 K/uL  Lactate dehydrogenase  Result Value Ref Range   LDH 106 98 - 192 U/L  Technologist smear review  Result Value Ref Range   WBC Morphology MORPHOLOGY UNREMARKABLE    RBC Morphology RBC MORPHOLOGY NORMAL    Tech Review PLATELETS APPEAR ADEQUATE    Lab Results  Component Value Date   TSH 4.29 10/03/2019  Lab Results  Component Value Date   HGBA1C 5.7 (A) 10/03/2019    Assessment & Plan:  This visit occurred during the SARS-CoV-2 public health emergency.   Safety protocols were in place, including screening questions prior to the visit, additional usage of staff PPE, and extensive cleaning of exam room while observing appropriate contact time as indicated for disinfecting solutions.   Problem List Items Addressed This Visit    Right knee pain    Presumed DJD, h/o L knee replacement. Encouraged she call and schedule ortho f/u.       Osteoarthritis    She takes tylenol and aleve regularly.  Discussed adding supplement for joint health such as glucosamine       Relevant Medications   traMADol (ULTRAM) 50 MG tablet   Mass of right side of neck    S/p normal [place], contrasted CT (2021)      Left hip pain    Encouraged she call to schedule ortho f/u       Fibromyalgia   Relevant Medications   traMADol (ULTRAM) 50 MG tablet   Cervical neck pain with evidence of disc disease - Primary    Chronic R sided neck pain with evidence of DDD and moderate to severe facet arthropathy - this is predominant concern of patient today - will refer to PMR. Will prescribe tramadol to use PRN breakthrough pain after tylenol/ibuprofen.       Relevant Orders   Ambulatory referral to Physical Medicine Rehab   Anemia    Mild. Microcytic on path review (09/2019) - started ferrous sulfate.  Consider checking iron stores next labs. Recent iFOB normal.       Abnormal weight loss    Weight loss stabilizing - she has decreased exercise routine some.           Meds ordered this encounter  Medications  . traMADol (ULTRAM) 50 MG tablet    Sig: Take 1 tablet (50 mg total) by mouth 2 (two) times daily as needed for moderate pain.    Dispense:  30 tablet    Refill:  0   Orders Placed This Encounter  Procedures  . Ambulatory referral to Physical Medicine Rehab    Referral Priority:   Routine    Referral Type:   Rehabilitation    Referral Reason:   Specialty Services Required    Requested Specialty:   Physical Medicine and Rehabilitation    Number of Visits  Requested:   1    Patient Instructions  Your xray and recent CT scan showed severe neck joint arthritis changes which are causing your neck pain - we will refer you to spine clinic to discuss steroid injection into the spine.  Call to schedule appointment with ortho for new evaluation of hips and knees.  Take tramadol as needed for breakthrough pain after naprosyn and tylenol.  Consider turmeric or osteo bi flex (glucosamine) for joints.    Follow up plan: Return if symptoms worsen or fail to improve.  Ria Bush, MD

## 2019-10-23 ENCOUNTER — Encounter: Payer: Self-pay | Admitting: Family Medicine

## 2019-10-23 DIAGNOSIS — M25561 Pain in right knee: Secondary | ICD-10-CM | POA: Insufficient documentation

## 2019-10-23 DIAGNOSIS — M1990 Unspecified osteoarthritis, unspecified site: Secondary | ICD-10-CM | POA: Insufficient documentation

## 2019-10-23 DIAGNOSIS — M5090 Cervical disc disorder, unspecified, unspecified cervical region: Secondary | ICD-10-CM | POA: Insufficient documentation

## 2019-10-23 DIAGNOSIS — M25552 Pain in left hip: Secondary | ICD-10-CM | POA: Insufficient documentation

## 2019-10-23 DIAGNOSIS — D649 Anemia, unspecified: Secondary | ICD-10-CM | POA: Insufficient documentation

## 2019-10-23 LAB — CALRETICULIN (CALR) MUTATION ANALYSIS

## 2019-10-23 LAB — MPL MUTATION ANALYSIS

## 2019-10-23 NOTE — Assessment & Plan Note (Addendum)
Chronic R sided neck pain with evidence of DDD and moderate to severe facet arthropathy - this is predominant concern of patient today - will refer to PMR. Will prescribe tramadol to use PRN breakthrough pain after tylenol/ibuprofen.

## 2019-10-23 NOTE — Assessment & Plan Note (Addendum)
Mild. Microcytic on path review (09/2019) - started ferrous sulfate.  Consider checking iron stores next labs. Recent iFOB normal.

## 2019-10-23 NOTE — Assessment & Plan Note (Signed)
S/p normal [place], contrasted CT (2021)

## 2019-10-23 NOTE — Assessment & Plan Note (Signed)
Presumed DJD, h/o L knee replacement. Encouraged she call and schedule ortho f/u.

## 2019-10-23 NOTE — Assessment & Plan Note (Addendum)
She takes tylenol and aleve regularly.  Discussed adding supplement for joint health such as glucosamine

## 2019-10-23 NOTE — Assessment & Plan Note (Signed)
Encouraged she call to schedule ortho f/u

## 2019-10-23 NOTE — Assessment & Plan Note (Signed)
Weight loss stabilizing - she has decreased exercise routine some.

## 2019-10-24 ENCOUNTER — Telehealth: Payer: Self-pay | Admitting: Family Medicine

## 2019-10-24 DIAGNOSIS — M25561 Pain in right knee: Secondary | ICD-10-CM

## 2019-10-24 DIAGNOSIS — M159 Polyosteoarthritis, unspecified: Secondary | ICD-10-CM

## 2019-10-24 DIAGNOSIS — G8929 Other chronic pain: Secondary | ICD-10-CM

## 2019-10-24 DIAGNOSIS — M1712 Unilateral primary osteoarthritis, left knee: Secondary | ICD-10-CM

## 2019-10-24 DIAGNOSIS — M8949 Other hypertrophic osteoarthropathy, multiple sites: Secondary | ICD-10-CM

## 2019-10-24 DIAGNOSIS — M25552 Pain in left hip: Secondary | ICD-10-CM

## 2019-10-24 LAB — JAK2 GENOTYPR

## 2019-10-24 MED ORDER — AMOXICILLIN-POT CLAVULANATE 875-125 MG PO TABS: 1.0000 | ORAL_TABLET | Freq: Two times a day (BID) | ORAL | 0 refills | Status: AC

## 2019-10-24 NOTE — Addendum Note (Signed)
Addended by: Ria Bush on: 10/24/2019 05:24 PM   Modules accepted: Orders

## 2019-10-24 NOTE — Telephone Encounter (Signed)
Pt called needing to get a referral to [facility] ortho.  They won't make an appointment with out referral  Pt stated marion told her dr g wants her to go to [facility]  Fax [phone] ATTN:  Ebony Hail

## 2019-10-24 NOTE — Telephone Encounter (Signed)
Referral placed.

## 2019-10-28 LAB — BCR-ABL1 FISH
Cells Analyzed: 200
Cells Counted: 200

## 2019-11-04 ENCOUNTER — Ambulatory Visit: Payer: Medicare HMO

## 2019-11-04 ENCOUNTER — Ambulatory Visit: Payer: Medicare HMO | Admitting: Internal Medicine

## 2019-11-05 ENCOUNTER — Inpatient Hospital Stay: Payer: Medicare HMO | Admitting: Internal Medicine

## 2019-11-09 ENCOUNTER — Other Ambulatory Visit: Payer: Self-pay | Admitting: Family Medicine

## 2019-11-09 DIAGNOSIS — R69 Illness, unspecified: Secondary | ICD-10-CM | POA: Diagnosis not present

## 2019-11-11 ENCOUNTER — Encounter: Payer: Self-pay | Admitting: Family Medicine

## 2019-11-11 DIAGNOSIS — M5030 Other cervical disc degeneration, unspecified cervical region: Secondary | ICD-10-CM | POA: Diagnosis not present

## 2019-11-11 DIAGNOSIS — M4302 Spondylolysis, cervical region: Secondary | ICD-10-CM | POA: Diagnosis not present

## 2019-11-15 ENCOUNTER — Ambulatory Visit: Payer: Medicare HMO | Attending: Internal Medicine

## 2019-11-15 DIAGNOSIS — Z23 Encounter for immunization: Secondary | ICD-10-CM

## 2019-11-15 NOTE — Progress Notes (Signed)
   Covid-19 Vaccination Clinic  Name:  BRONNIE FANTA    MRN: [id number] DOB: September 07, 1950  11/15/2019  Ms. Marcello was observed post Covid-19 immunization for 15 minutes without incident. She was provided with Vaccine Information Sheet and instruction to access the V-Safe system.   Ms. Bottari was instructed to call 911 with any severe reactions post vaccine: Marland Kitchen Difficulty breathing  . Swelling of face and throat  . A fast heartbeat  . A bad rash all over body  . Dizziness and weakness   Immunizations Administered    Name Date Dose VIS Date Route   Moderna COVID-19 Vaccine 11/15/2019  1:09 PM 0.5 mL 07/22/2019 Intramuscular   Manufacturer: Levan Hurst   Lot: [id number]   [place]: [id number]

## 2019-12-12 DIAGNOSIS — M4302 Spondylolysis, cervical region: Secondary | ICD-10-CM | POA: Diagnosis not present

## 2019-12-13 ENCOUNTER — Other Ambulatory Visit: Payer: Self-pay | Admitting: Family Medicine

## 2019-12-14 ENCOUNTER — Encounter: Payer: Self-pay | Admitting: Family Medicine

## 2020-01-29 DIAGNOSIS — R69 Illness, unspecified: Secondary | ICD-10-CM | POA: Diagnosis not present

## 2020-02-13 ENCOUNTER — Telehealth: Payer: Self-pay | Admitting: Family Medicine

## 2020-02-13 NOTE — Telephone Encounter (Signed)
Placed form in Dr. G's box.  

## 2020-02-13 NOTE — Telephone Encounter (Signed)
Patient came in office and dropped off placard form, it is placed in the tower.

## 2020-02-13 NOTE — Telephone Encounter (Addendum)
Filled and in Lisa's box 

## 2020-02-16 ENCOUNTER — Encounter: Payer: Self-pay | Admitting: Family Medicine

## 2020-02-16 DIAGNOSIS — Z9989 Dependence on other enabling machines and devices: Secondary | ICD-10-CM

## 2020-02-16 DIAGNOSIS — M47812 Spondylosis without myelopathy or radiculopathy, cervical region: Secondary | ICD-10-CM | POA: Diagnosis not present

## 2020-02-16 DIAGNOSIS — R0602 Shortness of breath: Secondary | ICD-10-CM

## 2020-02-16 NOTE — Telephone Encounter (Signed)
Spoke with pt notifying her the form is ready to pick up.  Verbalizes understanding.  [Placed form at front office- yellow folders.]

## 2020-03-03 DIAGNOSIS — H6123 Impacted cerumen, bilateral: Secondary | ICD-10-CM | POA: Diagnosis not present

## 2020-03-03 DIAGNOSIS — R1310 Dysphagia, unspecified: Secondary | ICD-10-CM | POA: Diagnosis not present

## 2020-03-03 DIAGNOSIS — J9589 Other postprocedural complications and disorders of respiratory system, not elsewhere classified: Secondary | ICD-10-CM | POA: Diagnosis not present

## 2020-03-04 ENCOUNTER — Other Ambulatory Visit: Payer: Self-pay | Admitting: Unknown Physician Specialty

## 2020-03-04 DIAGNOSIS — R1310 Dysphagia, unspecified: Secondary | ICD-10-CM

## 2020-03-05 ENCOUNTER — Encounter: Payer: Self-pay | Admitting: Family Medicine

## 2020-03-22 ENCOUNTER — Encounter: Payer: Self-pay | Admitting: Family Medicine

## 2020-03-22 ENCOUNTER — Ambulatory Visit (INDEPENDENT_AMBULATORY_CARE_PROVIDER_SITE_OTHER): Payer: Medicare HMO | Admitting: Family Medicine

## 2020-03-22 ENCOUNTER — Other Ambulatory Visit: Payer: Self-pay

## 2020-03-22 VITALS — BP 118/66 | HR 81 | Temp 97.6°F | Ht 61.0 in | Wt 238.4 lb

## 2020-03-22 DIAGNOSIS — M5090 Cervical disc disorder, unspecified, unspecified cervical region: Secondary | ICD-10-CM

## 2020-03-22 DIAGNOSIS — R1084 Generalized abdominal pain: Secondary | ICD-10-CM | POA: Diagnosis not present

## 2020-03-22 DIAGNOSIS — R0602 Shortness of breath: Secondary | ICD-10-CM

## 2020-03-22 DIAGNOSIS — Z794 Long term (current) use of insulin: Secondary | ICD-10-CM

## 2020-03-22 DIAGNOSIS — E118 Type 2 diabetes mellitus with unspecified complications: Secondary | ICD-10-CM | POA: Diagnosis not present

## 2020-03-22 LAB — POCT GLYCOSYLATED HEMOGLOBIN (HGB A1C): Hemoglobin A1C: 6.3 % — AB (ref 4.0–5.6)

## 2020-03-22 MED ORDER — LORATADINE 10 MG PO TABS: 10.0000 mg | ORAL_TABLET | Freq: Every day | ORAL | Status: AC

## 2020-03-22 MED ORDER — GLIMEPIRIDE 1 MG PO TABS: 1.0000 mg | ORAL_TABLET | Freq: Every day | ORAL | 1 refills | Status: DC

## 2020-03-22 NOTE — Progress Notes (Signed)
This visit was conducted in person.  BP 118/66 (BP Location: Left Arm, Patient Position: Sitting, Cuff Size: Large)   Pulse 81   Temp 97.6 F (36.4 C) (Temporal)   Ht 5\' 1"  (1.549 m)   Wt 238 lb 6 oz (108.1 kg)   SpO2 95%   BMI 45.04 kg/m    CC: DM f/u visit  Subjective:    Patient ID: [name], female    DOB: 1951-07-18, 69 y.o.   MRN: [id number]  HPI: [name] is a 69 y.o. female presenting on 03/22/2020 for Diabetes (Here for f/u.)   Received 2nd set of steroid injection into cervical neck - first shot gave 1 mo relief, second shot not as effective (02/16/2020).   Significant gagging - seeing ENT pending barium swallow.  Has pulm appt scheduled for Monday Patsey Berthold). Quit smoking 03/16/2010.   1 wk h/o abd pain, diarrhea, nausea. Wonders if egg allergy as GI symptoms seem to flare with egg (over the past 2 weeks). No new medicines. No new restaurants. No similar GI symptoms at home. Uses well water. No fevers, blood in stool, black tarry stools.   DM - does regularly check sugars and brings log for the last few days: 110-130 fasting, 160-180 after dinner. Compliant with antihyperglycemic regimen which includes: novolin NPH 40u daily, metformin 1000mg  bid, actos 15mg  daily and glimepiride 1mg  daily. Denies low sugars or hypoglycemic symptoms. Denies paresthesias. Last diabetic eye exam 06/2019. Pneumovax: 2012. Prevnar: 2019. Glucometer brand: one touch. DSME: completed 2012. Lab Results  Component Value Date   HGBA1C 6.3 (A) 03/22/2020   Diabetic Foot Exam - Simple   Simple Foot Form Diabetic Foot exam was performed with the following findings: Yes 03/22/2020  2:03 PM  Visual Inspection No deformities, no ulcerations, no other skin breakdown bilaterally: Yes Sensation Testing Intact to touch and monofilament testing bilaterally: Yes Pulse Check See comments: Yes Comments Diminished distal pulses    Lab Results  Component Value Date   MICROALBUR 5.8 (H)  01/15/2015        Relevant past medical, surgical, family and social history reviewed and updated as indicated. Interim medical history since our last visit reviewed. Allergies and medications reviewed and updated. Outpatient Medications Prior to Visit  Medication Sig Dispense Refill  . Acetaminophen (TYLENOL ARTHRITIS PAIN PO) Take by mouth in the morning and at bedtime.    . APPLE CIDER VINEGAR PO Take by mouth.    Marland Kitchen aspirin EC 81 MG tablet Take 81 mg by mouth daily.     . Boswellia-Glucosamine-Vit D (OSTEO BI-FLEX ONE PER DAY PO) Take by mouth daily.    . carvedilol (COREG) 3.125 MG tablet TAKE 1 TABLET TWICE A DAY  WITH MEALS 180 tablet 3  . famotidine (PEPCID) 20 MG tablet Take 20 mg by mouth daily. Alternates with omeprazole    . hydrochlorothiazide (HYDRODIURIL) 25 MG tablet TAKE 1 TABLET DAILY 90 tablet 3  . INS SYRINGE/NEEDLE 1CC/28G (B-D INS SYR MICROFINE 1CC/28G) 28G X 1/2" 1 ML MISC USE AS DIRECTED 100 each 3  . insulin NPH Human (NOVOLIN N) 100 UNIT/ML injection Inject 0.4 mLs (40 Units total) into the skin at bedtime. 36 mL 3  . levothyroxine (SYNTHROID) 100 MCG tablet TAKE 1 TABLET DAILY, AND ON2 DAYS A WEEK, TAKE AN     EXTRA 1/2 TABLET 90 tablet 3  . lisinopril (ZESTRIL) 5 MG tablet TAKE 1 TABLET DAILY 90 tablet 3  . metFORMIN (GLUCOPHAGE) 500 MG tablet  TAKE 2 TABLETS 2 TIMES     DAILY WITH MEALS 360 tablet 1  . MISC NATURAL PRODUCTS PO Take by mouth daily. Amberen Menopause Relief    . Multiple Vitamin (MULTIVITAMIN) tablet Take 1 tablet by mouth daily.    Marland Kitchen nystatin cream (MYCOSTATIN) APPLY TO AFFECTED AREA TWICE A DAY 30 g 1  . omeprazole (PRILOSEC) 20 MG capsule Take 1 daily-Alternates with famotidine 90 capsule 2  . ONE TOUCH LANCETS MISC Use to check sugar twice daily Dx: E11.8 200 each 3  . ONETOUCH ULTRA test strip USE TO CHECK BLOOD SUGAR 2 TIMES   DAILY 200 strip 3  . pioglitazone (ACTOS) 15 MG tablet TAKE 1 TABLET DAILY 90 tablet 1  . simvastatin (ZOCOR) 40 MG  tablet Take 1 tablet (40 mg total) by mouth at bedtime. 90 tablet 3  . ziprasidone (GEODON) 40 MG capsule TAKE 1 CAPSULE TWICE DAILY WITH MEALS 180 capsule 1  . glimepiride (AMARYL) 1 MG tablet Take 1 mg by mouth daily with breakfast.    . Naproxen Sodium 220 MG CAPS Take 440 mg by mouth 2 (two) times daily. (Patient not taking: Reported on 03/22/2020)    . ferrous sulfate 325 (65 FE) MG EC tablet Take 1 tablet (325 mg total) by mouth daily with breakfast.    . traMADol (ULTRAM) 50 MG tablet Take 1 tablet (50 mg total) by mouth 2 (two) times daily as needed for moderate pain. 30 tablet 0   No facility-administered medications prior to visit.     Per HPI unless specifically indicated in ROS section below Review of Systems Objective:  BP 118/66 (BP Location: Left Arm, Patient Position: Sitting, Cuff Size: Large)   Pulse 81   Temp 97.6 F (36.4 C) (Temporal)   Ht 5\' 1"  (1.549 m)   Wt 238 lb 6 oz (108.1 kg)   SpO2 95%   BMI 45.04 kg/m   Wt Readings from Last 3 Encounters:  03/22/20 238 lb 6 oz (108.1 kg)  10/22/19 247 lb 7 oz (112.2 kg)  10/20/19 247 lb (112 kg)      Physical Exam Vitals and nursing note reviewed.  Constitutional:      General: She is not in acute distress.    Appearance: Normal appearance. She is well-developed. She is obese. She is not ill-appearing.     Comments: Ambulates with assistance of walker  HENT:     Head: Normocephalic and atraumatic.  Eyes:     General: No scleral icterus.    Extraocular Movements: Extraocular movements intact.     Conjunctiva/sclera: Conjunctivae normal.     Pupils: Pupils are equal, round, and reactive to light.  Cardiovascular:     Rate and Rhythm: Normal rate and regular rhythm.     Pulses: Normal pulses.     Heart sounds: Normal heart sounds. No murmur heard.   Pulmonary:     Effort: Pulmonary effort is normal. No respiratory distress.     Breath sounds: Normal breath sounds. No wheezing, rhonchi or rales.    Musculoskeletal:     Cervical back: Normal range of motion and neck supple.     Right lower leg: No edema.     Left lower leg: No edema.     Comments: See HPI for foot exam if done  Lymphadenopathy:     Cervical: No cervical adenopathy.  Skin:    General: Skin is warm and dry.     Findings: No rash.  Neurological:  Mental Status: She is alert.       Results for orders placed or performed in visit on 03/22/20  POCT glycosylated hemoglobin (Hb A1C)  Result Value Ref Range   Hemoglobin A1C 6.3 (A) 4.0 - 5.6 %   HbA1c POC (<> result, manual entry)     HbA1c, POC (prediabetic range)     HbA1c, POC (controlled diabetic range)     Assessment & Plan:  This visit occurred during the SARS-CoV-2 public health emergency.  Safety protocols were in place, including screening questions prior to the visit, additional usage of staff PPE, and extensive cleaning of exam room while observing appropriate contact time as indicated for disinfecting solutions.   Problem List Items Addressed This Visit    Obesity, morbid, BMI 40.0-49.9 ([place])    Down another 9 lbs.       Relevant Medications   glimepiride (AMARYL) 1 MG tablet   Dyspnea    Ongoing. Has see cards and pulm. Has pulm f/u planned.       Diabetes mellitus type 2, controlled, with complications ([place]) - Primary    Chronic, improved since restarting glimepiride 1mg  daily.       Relevant Medications   glimepiride (AMARYL) 1 MG tablet   Other Relevant Orders   POCT glycosylated hemoglobin (Hb A1C) (Completed)   Cervical neck pain with evidence of disc disease    Appreciate PM&R care.       Abdominal pain    Pt worried about egg sensitivity.  Consider testing for egg allergy at next labwork.           Meds ordered this encounter  Medications  . loratadine (CLARITIN) 10 MG tablet    Sig: Take 1 tablet (10 mg total) by mouth daily.  Marland Kitchen glimepiride (AMARYL) 1 MG tablet    Sig: Take 1 tablet (1 mg total) by mouth daily with  breakfast.    Dispense:  90 tablet    Refill:  1   Orders Placed This Encounter  Procedures  . POCT glycosylated hemoglobin (Hb A1C)    Patient Instructions  Continue glimepiride 1mg  daily.  Sugars are staying well controlled- continue current medicines.  Good to see you today  Return as needed or in 2 months (after 04/23/2020) for medicare wellness and physical.    Follow up plan: Return in about 9 weeks (around 05/24/2020), or if symptoms worsen or fail to improve, for annual exam, prior fasting for blood work, medicare wellness visit.  Ria Bush, MD

## 2020-03-22 NOTE — Patient Instructions (Addendum)
Continue glimepiride 1mg  daily.  Sugars are staying well controlled- continue current medicines.  Good to see you today  Return as needed or in 2 months (after 04/23/2020) for medicare wellness and physical.

## 2020-03-23 ENCOUNTER — Ambulatory Visit
Admission: RE | Admit: 2020-03-23 | Discharge: 2020-03-23 | Disposition: A | Discharge status: Home / Self Care | Payer: Medicare HMO | Source: Ambulatory Visit | Attending: Unknown Physician Specialty | Admitting: Unknown Physician Specialty

## 2020-03-23 DIAGNOSIS — R1310 Dysphagia, unspecified: Secondary | ICD-10-CM | POA: Insufficient documentation

## 2020-03-23 DIAGNOSIS — Z0389 Encounter for observation for other suspected diseases and conditions ruled out: Secondary | ICD-10-CM | POA: Diagnosis not present

## 2020-03-23 NOTE — Assessment & Plan Note (Signed)
Down another 9 lbs.

## 2020-03-23 NOTE — Assessment & Plan Note (Addendum)
Pt worried about egg sensitivity.  Consider testing for egg allergy at next labwork.

## 2020-03-23 NOTE — Assessment & Plan Note (Signed)
Chronic, improved since restarting glimepiride 1mg  daily.

## 2020-03-23 NOTE — Therapy (Addendum)
[facility] [place], [place], [address] Phone: [phone]   Fax:     Modified Barium Swallow  Patient Details  Name: [name] MRN: [id number] Date of Birth: [birth year] No data recorded  Encounter Date: 03/23/2020   End of Session - 03/23/20 1426    Visit Number 1    Number of Visits 1    Date for SLP Re-Evaluation 03/23/20    SLP Start Time 42    SLP Stop Time  1400    SLP Time Calculation (min) 60 min    Activity Tolerance Patient tolerated treatment well           Past Medical History:  Diagnosis Date   Anxiety    Bipolar disorder ([place])    has stopped seeing psychiatrist   Chronic bronchitis    Coronary artery disease, non-occlusive    Dyspnea 2012   s/p pulm/cards w/u WNL, thought due to obesity/deconditioning   Fibromyalgia    GERD (gastroesophageal reflux disease)    History of chicken pox    HLD (hyperlipidemia)    Hypertension    Hypothyroidism    Migraine    OSA (obstructive sleep apnea) 03/16/2011   T2DM (type 2 diabetes mellitus) ([place]) 2012   DM education 06/2011   Urinary incontinence     Past Surgical History:  Procedure Laterality Date   BREAST CYST ASPIRATION Right 2003   cardiac catherization  03/2011   x3 Glennie Hawk), mild nonobstructive CAD   COLONOSCOPY  12/2011   hyperplastic polyp x2,. diverticulosis, rec rpt 10 yrs   ESOPHAGOGASTRODUODENOSCOPY  2006   KNEE SURGERY  2006   left, torn meniscus   TOTAL ABDOMINAL HYSTERECTOMY  2004   fibroids, cervix remained   TOTAL KNEE ARTHROPLASTY Left 08/2012   [facility]    There were no vitals filed for this visit.      Subjective: Patient behavior: (alertness, ability to follow instructions, etc.): pt was A/O x4. Verbally engaged w/ SLP describing difficulty swallowing moreso w/ Pills, some solids including meats/breads. She tears/shreds meats b/f taking a bite d/t Edentulous  status. She endorses s/s of Reflux: globus/"stuck" feeling; phlegm. Pt has a Baseline of GERD on PPI alternating b/t Pepcid and Omeprazole (20mg  each) 1x daily. Pt has baseline dxs including anxiety, Bipolar Dis., obesity, OSA, dyspnea. She denies any recent weight loss; denies any Neurological dxs; denies any recent Pulmonary issues.  Chief complaint: Pill dysphagia OM Exam: WFL; Edentulous; immediate GAG REFLEX (sensitive+)   Objective:  Radiological Procedure: A videoflouroscopic evaluation of oral-preparatory, reflex initiation, and pharyngeal phases of the swallow was performed; as well as a screening of the upper esophageal phase.  I. POSTURE: upright II. VIEW: lateral III. COMPENSATORY STRATEGIES: none indicated IV. BOLUSES ADMINISTERED:  Thin Liquid: 5 trials  Nectar-thick Liquid: 1 trial  Honey-thick Liquid: NT   Puree: 2 trials  Mechanical Soft: 2 trials V. RESULTS OF EVALUATION: A. ORAL PREPARATORY PHASE: (The lips, tongue, and velum are observed for strength and coordination)       **Overall Severity Rating: adequate-WFL. Pt is Edentulous which impacts ability to effectively masticate solids. She adequately tolerated soft solids moistened w/ timely oral phase management, A-P transfer w/ oral clearing following. Of note, pt was unable to transfer Barium Tablet Whole in tsp of Puree w/out Gag Reflex activated(tablet expectorated).   B. SWALLOW INITIATION/REFLEX: (The reflex is normal if triggered by the time the bolus reached the base of the tongue)  **  Overall Severity Rating: WFL.  C. PHARYNGEAL PHASE: (Pharyngeal function is normal if the bolus shows rapid, smooth, and continuous transit through the pharynx and there is no pharyngeal residue after the swallow)  **Overall Severity Rating: [facility].   D. LARYNGEAL PENETRATION: (Material entering into the laryngeal inlet/vestibule but not aspirated): NONE  E. ASPIRATION: NONE F. ESOPHAGEAL PHASE: (Screening of the upper esophagus):  prominent Cricopharyngeus muscle (both anterior and posterior prominence noted). Bolus motility was adequate through the UES w/ trial consistencies given this exam   ASSESSMENT: Pt appears to present w/ No gross oropharyngeal phase dysphagia; No Neuromuscular deficits of swallowing. No aspiration or penetration of po trial consistencies was noted to occur during this study. OF NOTE: min+ thickening of Cricopharyngeus Muscle w/ Both anterior and posterior prominence noted during the swallow -- this did not appear to impede bolus motility through the UES w/ trials given at this exam; no residue appeared to remain post swallow. Suspect this could be related to the sensation pt experiences when eating solids when she has "a hard time swallowing". View of the Cervical Esophagus was limited d/t shoulders. During the oral phase, timely bolus management and control of bolus propulsion for A-P transfer occurred. Oral clearing achieved w/ all trial consistencies. Pt is Edentulous which impacts ability to effectively masticate solids. She adequately tolerated soft solids moistened w/ timely oral phase management, A-P transfer w/ oral clearing following. Of Note, pt was unable to transfer Barium Tablet Whole in tsp of Puree w/out Gag Reflex elicited(tablet expectorated). During the pharyngeal phase, timely pharyngeal swallow initiation noted w/ all trial consistencies. No aspiration or penetration occurred; airway closure appeared timely, tight. No significant pharyngeal residue remained post swallow indicating adequate laryngeal excursion and pharyngeal pressure during the swallow.  Discussed results of MBSS and impact of Esophageal issues such as Reflux activity/dysmotility on swallowing; also discussed the need to fully break down tough solids d/t Edentulous status. Discussed food consistencies; moistening foods w/ Soups, Gravies. Discussed Pill swallowing strategies, however, pt is currently crushing/cutting Pills which  seems to work for swallowing w/out Gagging per pt report. Suggested she f/u w/ MD/Pharmacy to ensure her Medications are Crushable, or can be opened(if capsules). Video viewed w/ pt, and questions answered. Recommended f/u w/ GI for ongoing management of Reflux/GERD; education. Recommended continued f/u w/ ENT.     PLAN/RECOMMENDATIONS:  A. Diet: Mech Soft diet w/ meats Minced, gravies added to moisten foods; Thin liquids. Pt is Edentulous so bites should be Small and moistened for ease of gumming/mashing, then swallowing.   B. Swallowing Precautions: general aspiration precautions including eating/drinking slowly/calmly monitoring for any SOB/WOB d/t Baseline Dyspnea. REFLUX/GERD precautions. Pills Crushed or Cut Small, or consider liquid or chewable forms if appropriate/available -- talk w/ Pharmacist. Discussed other options as well.  C. Recommended consultation to: f/u w/ ENT for ongoing monitoring of prominent Cricopharyngeus muscle; GI for management of GERD  D. Therapy recommendations: None  E. Results and recommendations were discussed w/ pt and Husband(after); video viewed and questions answered. Recommendations discussed. Pt agreed verbally.          Dysphagia, unspecified type  Pill dysphagia - Plan: DG SWALLOW FUNC OP MEDICARE SPEECH PATH, DG SWALLOW FUNC OP MEDICARE SPEECH PATH        Problem List Patient Active Problem List   Diagnosis Date Noted   Cervical neck pain with evidence of disc disease 10/23/2019   Left hip pain 10/23/2019   Right knee pain 10/23/2019   Osteoarthritis 10/23/2019  Anemia 10/23/2019   Abnormal weight loss 10/05/2019   Hepatic steatosis 05/03/2019   Thrombocytosis ([place]) 04/24/2019   Abdominal pain 04/24/2019   Headache 04/19/2018   Obesity, morbid, BMI 40.0-49.9 ([place]) 08/17/2017   Palpitations 01/08/2017   Mass of right side of neck 07/10/2016   Neutrophilia 04/30/2015   Advanced care planning/counseling discussion  01/22/2015   Health maintenance examination 01/22/2015   Medicare annual wellness visit, subsequent 12/15/2013   Tachycardia 09/13/2012   Osteoarthritis of left knee 12/11/2011   Candidal intertrigo 05/10/2011   Hypertension    Bipolar disorder ([place])    Fibromyalgia    Hyperlipidemia associated with type 2 diabetes mellitus ([place])    GERD (gastroesophageal reflux disease)    Hypothyroidism 04/05/2011   OSA on CPAP 03/16/2011   CAD (coronary artery disease) 03/16/2011   Diabetes mellitus type 2, controlled, with complications ([place]) 89/84/2103   Dyspnea 01/03/2011      Orinda Kenner, MS, CCC-SLP [place] Every 03/23/2020, 2:28 PM  [facility] [place], [place], [address] Phone: [phone]   Fax:     Name: [name] MRN: [id number] Date of Birth: [birth year]

## 2020-03-23 NOTE — Assessment & Plan Note (Signed)
Appreciate PMR care  

## 2020-03-23 NOTE — Assessment & Plan Note (Signed)
Ongoing. Has see cards and pulm. Has pulm f/u planned.

## 2020-03-24 DIAGNOSIS — R1310 Dysphagia, unspecified: Secondary | ICD-10-CM | POA: Diagnosis not present

## 2020-03-25 ENCOUNTER — Encounter: Payer: Self-pay | Admitting: Physician Assistant

## 2020-03-25 ENCOUNTER — Encounter: Payer: Self-pay | Admitting: Family Medicine

## 2020-03-25 DIAGNOSIS — R1310 Dysphagia, unspecified: Secondary | ICD-10-CM

## 2020-03-29 ENCOUNTER — Encounter: Payer: Self-pay | Admitting: Pulmonary Disease

## 2020-03-29 ENCOUNTER — Other Ambulatory Visit: Payer: Self-pay

## 2020-03-29 ENCOUNTER — Ambulatory Visit: Payer: Medicare HMO | Admitting: Pulmonary Disease

## 2020-03-29 VITALS — BP 130/70 | HR 84 | Temp 97.3°F | Ht 61.0 in | Wt 241.0 lb

## 2020-03-29 DIAGNOSIS — R0602 Shortness of breath: Secondary | ICD-10-CM

## 2020-03-29 NOTE — Progress Notes (Signed)
 Assessment & Plan:  1. SOB (shortness of breath) (Primary) - Pulmonary Function Test [facility] Only; Future   Patient Instructions  We are going to get breathing tests  We are referring you to the lung cancer screening program  You will need to be reassessed for your CPAP once your reflux issues are taken care of  We will see you in follow-up in 6 to 8 weeks time call sooner should any new problems arise  Please note: late entry documentation due to logistical difficulties during COVID-19 pandemic. This note is filed for information purposes only, and is not intended to be used for billing, nor does it represent the full scope/nature of the visit in question. Please see any associated scanned media linked to date of encounter for additional pertinent information.  Subjective:    HPI: [name] is a 69 y.o. female presenting to the pulmonology clinic on 03/29/2020 with report of: pulmonary consult (c/o sob with exertion x10y)     Outpatient Encounter Medications as of 03/29/2020  Medication Sig   Boswellia-Glucosamine-Vit D (OSTEO BI-FLEX ONE PER DAY PO) Take 1 tablet by mouth daily.   famotidine  (PEPCID ) 20 MG tablet Take 20 mg by mouth every other day. Alternates with omeprazole    loratadine  (CLARITIN ) 10 MG tablet Take 1 tablet (10 mg total) by mouth daily.   [DISCONTINUED] Acetaminophen  (TYLENOL  ARTHRITIS PAIN PO) Take by mouth in the morning and at bedtime.   [DISCONTINUED] APPLE CIDER VINEGAR PO Take 1 tablet by mouth in the morning and at bedtime.    [DISCONTINUED] aspirin  EC 81 MG tablet Take 81 mg by mouth every evening.    [DISCONTINUED] carvedilol  (COREG ) 3.125 MG tablet TAKE 1 TABLET TWICE A DAY  WITH MEALS   [DISCONTINUED] glimepiride  (AMARYL ) 1 MG tablet Take 1 tablet (1 mg total) by mouth daily with breakfast.   [DISCONTINUED] hydrochlorothiazide  (HYDRODIURIL ) 25 MG tablet TAKE 1 TABLET DAILY   [DISCONTINUED] INS SYRINGE/NEEDLE 1CC/28G (B-D INS SYR MICROFINE  1CC/28G) 28G X 1/2 1 ML MISC USE AS DIRECTED   [DISCONTINUED] insulin  NPH Human (NOVOLIN  N) 100 UNIT/ML injection Inject 0.4 mLs (40 Units total) into the skin at bedtime.   [DISCONTINUED] levothyroxine  (SYNTHROID ) 100 MCG tablet TAKE 1 TABLET DAILY, AND ON2 DAYS A WEEK, TAKE AN     EXTRA 1/2 TABLET   [DISCONTINUED] lisinopril  (ZESTRIL ) 5 MG tablet TAKE 1 TABLET DAILY   [DISCONTINUED] metFORMIN  (GLUCOPHAGE ) 500 MG tablet TAKE 2 TABLETS 2 TIMES     DAILY WITH MEALS   [DISCONTINUED] MISC NATURAL PRODUCTS PO Take by mouth daily. Amberen Menopause Relief   [DISCONTINUED] Multiple Vitamin (MULTIVITAMIN) tablet Take 1 tablet by mouth daily.   [DISCONTINUED] Naproxen  Sodium 220 MG CAPS Take 440 mg by mouth 2 (two) times daily.  (Patient not taking: Reported on 07/02/2020)   [DISCONTINUED] nystatin  cream (MYCOSTATIN ) APPLY TO AFFECTED AREA TWICE A DAY   [DISCONTINUED] omeprazole  (PRILOSEC) 20 MG capsule Take 1 daily-Alternates with famotidine    [DISCONTINUED] ONE TOUCH LANCETS MISC Use to check sugar twice daily Dx: E11.8   [DISCONTINUED] ONETOUCH ULTRA test strip USE TO CHECK BLOOD SUGAR 2 TIMES   DAILY   [DISCONTINUED] pioglitazone  (ACTOS ) 15 MG tablet TAKE 1 TABLET DAILY   [DISCONTINUED] simvastatin  (ZOCOR ) 40 MG tablet Take 1 tablet (40 mg total) by mouth at bedtime.   [DISCONTINUED] ziprasidone  (GEODON ) 40 MG capsule TAKE 1 CAPSULE TWICE DAILY WITH MEALS   No facility-administered encounter medications on file as of 03/29/2020.  Objective:   Vitals:   03/29/20 1026  BP: 130/70  Pulse: 84  Temp: (!) 97.3 F (36.3 C)  Height: 5' 1 (1.549 m)  Weight: 241 lb (109.3 kg)  SpO2: 98%  TempSrc: Temporal  BMI (Calculated): 45.56     Physical exam documentation is limited by delayed entry of information.

## 2020-03-29 NOTE — Patient Instructions (Signed)
We are going to get breathing tests  We are referring you to the lung cancer screening program  You will need to be reassessed for your CPAP once your reflux issues are taken care of  We will see you in follow-up in 6 to 8 weeks time call sooner should any new problems arise

## 2020-03-30 ENCOUNTER — Encounter: Payer: Self-pay | Admitting: Family Medicine

## 2020-04-05 DIAGNOSIS — R1310 Dysphagia, unspecified: Secondary | ICD-10-CM | POA: Insufficient documentation

## 2020-04-06 ENCOUNTER — Telehealth: Payer: Self-pay

## 2020-04-06 NOTE — Telephone Encounter (Signed)
Patient also confirmed her insurance was correct as listed [employer])

## 2020-04-06 NOTE — Telephone Encounter (Signed)
Received referral for initial lung cancer screening scan from Dr. Vernard Gambles. Contacted patient and obtained smoking history, former quit 10 years ago on Sept 27 2011.  Patient started smoking at at 67 and smoked 43 years before stopping.  When she smoked she shared she smoked menthol cigarettes.  She smoked 2 packs per day.  I explained program and answered questions related to screening process. Patient shares she does have chronic shortness of breath which has been present since she stopped smoking 10 years ago.  She shares she gets short of breath with walking and sometimes talking.  She does use a walker due to knee problems and shortness of breath.  She has been seen at the cancer center in the past for hematology consult for elavated WBC count.   Patient is scheduled for shared decision making visit and CT scan 04/13/2020 at 1:45.  She plans to arrive a little early to participate in shared decision making visit with Nurse Practitioner.

## 2020-04-07 ENCOUNTER — Encounter: Payer: Self-pay | Admitting: Family Medicine

## 2020-04-07 ENCOUNTER — Other Ambulatory Visit: Payer: Self-pay | Admitting: *Deleted

## 2020-04-07 DIAGNOSIS — Z87891 Personal history of nicotine dependence: Secondary | ICD-10-CM

## 2020-04-07 DIAGNOSIS — Z122 Encounter for screening for malignant neoplasm of respiratory organs: Secondary | ICD-10-CM

## 2020-04-13 ENCOUNTER — Other Ambulatory Visit: Payer: Self-pay

## 2020-04-13 ENCOUNTER — Ambulatory Visit
Admission: RE | Admit: 2020-04-13 | Discharge: 2020-04-13 | Disposition: A | Discharge status: Home / Self Care | Payer: Medicare HMO | Source: Ambulatory Visit | Attending: Oncology | Admitting: Oncology

## 2020-04-13 ENCOUNTER — Inpatient Hospital Stay: Payer: Medicare HMO | Attending: Nurse Practitioner | Admitting: Oncology

## 2020-04-13 ENCOUNTER — Encounter: Payer: Self-pay | Admitting: Oncology

## 2020-04-13 DIAGNOSIS — Z122 Encounter for screening for malignant neoplasm of respiratory organs: Secondary | ICD-10-CM

## 2020-04-13 DIAGNOSIS — Z87891 Personal history of nicotine dependence: Secondary | ICD-10-CM | POA: Diagnosis not present

## 2020-04-13 NOTE — Progress Notes (Signed)
Virtual Visit via Video Note  I connected with [name] on 04/13/20 at  1:45 PM EDT by a video enabled telemedicine application and verified that I am speaking with the correct person using two identifiers.  Location: Patient: [facility] Provider: Clinic    I discussed the limitations of evaluation and management by telemedicine and the availability of in person appointments. The patient expressed understanding and agreed to proceed.  I discussed the assessment and treatment plan with the patient. The patient was provided an opportunity to ask questions and all were answered. The patient agreed with the plan and demonstrated an understanding of the instructions.   The patient was advised to call back or seek an in-person evaluation if the symptoms worsen or if the condition fails to improve as anticipated.   In accordance with CMS guidelines, patient has met eligibility criteria including age, absence of signs or symptoms of lung cancer.  Social History   Tobacco Use  . Smoking status: Former Smoker    Packs/day: 2.00    Years: 43.00    Pack years: 86.00    Types: Cigarettes    Quit date: 05/17/2010    Years since quitting: 9.9  . Smokeless tobacco: Never Used  Vaping Use  . Vaping Use: Never used  Substance Use Topics  . Alcohol use: No    Alcohol/week: 0.0 standard drinks  . Drug use: No      A shared decision-making session was conducted prior to the performance of CT scan. This includes one or more decision aids, includes benefits and harms of screening, follow-up diagnostic testing, over-diagnosis, false positive rate, and total radiation exposure.   Counseling on the importance of adherence to annual lung cancer LDCT screening, impact of co-morbidities, and ability or willingness to undergo diagnosis and treatment is imperative for compliance of the program.   Counseling on the importance of continued smoking cessation for former smokers; the importance of smoking cessation  for current smokers, and information about tobacco cessation interventions have been given to patient including [place] and [facility] quit [place] programs.   Written order for lung cancer screening with LDCT has been given to the patient and any and all questions have been answered to the best of my abilities.    Yearly follow up will be coordinated by Burgess Estelle, Thoracic Navigator.  I provided 15 minutes of face-to-face video visit time during this encounter, and > 50% was spent counseling as documented under my assessment & plan.   Jacquelin Hawking, NP

## 2020-04-14 ENCOUNTER — Telehealth: Payer: Self-pay | Admitting: *Deleted

## 2020-04-14 NOTE — Telephone Encounter (Signed)
Notified patient of LDCT lung cancer screening program results with recommendation for 12 month follow up imaging. Also notified of incidental findings noted below and is encouraged to discuss further with PCP who will receive a copy of this note and/or the CT report. Patient verbalizes understanding.   IMPRESSION: 1. Lung-RADS 2, benign appearance or behavior. Continue annual screening with low-dose chest CT without contrast in 12 months. 2. Aortic Atherosclerosis (ICD10-I70.0) and Emphysema (ICD10-J43.9).  

## 2020-04-15 ENCOUNTER — Encounter: Payer: Self-pay | Admitting: Family Medicine

## 2020-04-15 DIAGNOSIS — J45909 Unspecified asthma, uncomplicated: Secondary | ICD-10-CM | POA: Insufficient documentation

## 2020-04-15 DIAGNOSIS — J449 Chronic obstructive pulmonary disease, unspecified: Secondary | ICD-10-CM | POA: Insufficient documentation

## 2020-04-15 DIAGNOSIS — I2510 Atherosclerotic heart disease of native coronary artery without angina pectoris: Secondary | ICD-10-CM

## 2020-04-15 DIAGNOSIS — J432 Centrilobular emphysema: Secondary | ICD-10-CM

## 2020-04-15 DIAGNOSIS — I700 Atherosclerosis of aorta: Secondary | ICD-10-CM

## 2020-04-17 ENCOUNTER — Other Ambulatory Visit: Payer: Self-pay | Admitting: Family Medicine

## 2020-04-19 ENCOUNTER — Other Ambulatory Visit
Admission: RE | Admit: 2020-04-19 | Discharge: 2020-04-19 | Disposition: A | Discharge status: Home / Self Care | Payer: Medicare HMO | Source: Ambulatory Visit | Attending: Family Medicine | Admitting: Family Medicine

## 2020-04-19 ENCOUNTER — Other Ambulatory Visit: Payer: Self-pay

## 2020-04-19 DIAGNOSIS — Z20822 Contact with and (suspected) exposure to covid-19: Secondary | ICD-10-CM | POA: Insufficient documentation

## 2020-04-19 DIAGNOSIS — Z01812 Encounter for preprocedural laboratory examination: Secondary | ICD-10-CM | POA: Diagnosis not present

## 2020-04-19 LAB — SARS CORONAVIRUS 2 (TAT 6-24 HRS): SARS Coronavirus 2: NEGATIVE

## 2020-04-19 NOTE — Telephone Encounter (Signed)
Ziprasidone Last filled:  01/29/20, #180 Last OV:  03/22/20, DM f/u Next OV:  05/26/20, AWV prt 2

## 2020-04-20 ENCOUNTER — Ambulatory Visit: Payer: Medicare HMO | Attending: Pulmonary Disease

## 2020-04-20 DIAGNOSIS — R69 Illness, unspecified: Secondary | ICD-10-CM | POA: Diagnosis not present

## 2020-04-20 DIAGNOSIS — R0602 Shortness of breath: Secondary | ICD-10-CM | POA: Diagnosis present

## 2020-04-20 MED ORDER — ALBUTEROL SULFATE (2.5 MG/3ML) 0.083% IN NEBU
2.5000 mg | INHALATION_SOLUTION | Freq: Once | RESPIRATORY_TRACT | Status: AC
  Administered 2020-04-20: 2.5 mg via RESPIRATORY_TRACT
  Filled 2020-04-20: qty 3

## 2020-04-22 ENCOUNTER — Telehealth: Payer: Self-pay | Admitting: Pulmonary Disease

## 2020-04-22 NOTE — Telephone Encounter (Signed)
Called and spoke with patient to let her know that I would send message to Dr. Patsey Berthold to get results from CT and PFT.  Dr. Patsey Berthold please advise

## 2020-04-28 LAB — PULMONARY FUNCTION TEST ARMC ONLY
DL/VA % pred: 91 %
DL/VA: 3.85 ml/min/mmHg/L
DLCO unc % pred: 91 %
DLCO unc: 16.74 ml/min/mmHg
FEF 25-75 Post: 1.64 L/sec
FEF 25-75 Pre: 1.18 L/sec
FEF2575-%Change-Post: 38 %
FEF2575-%Pred-Post: 85 %
FEF2575-%Pred-Pre: 61 %
FEV1-%Change-Post: 7 %
FEV1-%Pred-Post: 84 %
FEV1-%Pred-Pre: 78 %
FEV1-Post: 1.84 L
FEV1-Pre: 1.7 L
FEV1FVC-%Change-Post: 5 %
FEV1FVC-%Pred-Pre: 94 %
FEV6-%Change-Post: 3 %
FEV6-%Pred-Post: 88 %
FEV6-%Pred-Pre: 85 %
FEV6-Post: 2.42 L
FEV6-Pre: 2.33 L
FEV6FVC-%Change-Post: 1 %
FEV6FVC-%Pred-Post: 104 %
FEV6FVC-%Pred-Pre: 103 %
FVC-%Change-Post: 2 %
FVC-%Pred-Post: 84 %
FVC-%Pred-Pre: 82 %
FVC-Post: 2.42 L
Post FEV1/FVC ratio: 76 %
Post FEV6/FVC ratio: 100 %
Pre FEV1/FVC ratio: 72 %
Pre FEV6/FVC Ratio: 99 %
RV % pred: 115 %
RV: 2.35 L
TLC % pred: 102 %
TLC: 4.89 L

## 2020-04-30 MED ORDER — ANORO ELLIPTA 62.5-25 MCG/INH IN AEPB: 1.0000 | INHALATION_SPRAY | Freq: Every day | RESPIRATORY_TRACT | 5 refills | Status: DC

## 2020-04-30 NOTE — Telephone Encounter (Signed)
Pt notified of response per Dr Patsey Berthold  She states was never given inhaler  I made her appt for 06/07/20  She is asking for inhaler to be called in to try between now and then  Thanks

## 2020-04-30 NOTE — Telephone Encounter (Signed)
Her COPD is very mild.  Shortness of breath likely more related to weight.  Weight loss is highly recommended.  We could give her a trial of Stiolto or Anoro which ever her insurance covers once daily to see if this takes care of the COPD component and helps with her shortness of breath.  Stiolto would be 2 puffs once a day, Anoro would be 1 puff daily but check to see which one her insurance would cover.

## 2020-04-30 NOTE — Telephone Encounter (Signed)
The results of the CT were provided to her by the lung cancer screening program.  The program gives her these results directly.  Her screening was benign at this time.  Results of PFTs are usually discussed during return visit.  She does have evidence of mild COPD with some additional restriction due to obesity.  Continue use of her inhalers and weight loss is recommended.

## 2020-04-30 NOTE — Telephone Encounter (Signed)
Spoke with the pt and notified of recs per Dr Patsey Berthold  Looks like according to Epic the Anoro is preferred and Stiolto is not covered at all  Rx for Anoro sent to her pharmacy

## 2020-05-03 ENCOUNTER — Encounter: Payer: Self-pay | Admitting: Family Medicine

## 2020-05-05 ENCOUNTER — Other Ambulatory Visit: Payer: Self-pay | Admitting: Family Medicine

## 2020-05-05 ENCOUNTER — Telehealth: Payer: Self-pay | Admitting: *Deleted

## 2020-05-05 MED ORDER — CYCLOBENZAPRINE HCL 5 MG PO TABS: 5.0000 mg | ORAL_TABLET | Freq: Two times a day (BID) | ORAL | 1 refills | Status: DC | PRN

## 2020-05-05 NOTE — Telephone Encounter (Signed)
Received fax from [facility] requesting PA for cyclobenzaprine 5 mg.  PA completed on CoverMyMeds and sent for review.  Can take on to 1 to 3 business days for a decision.

## 2020-05-05 NOTE — Telephone Encounter (Addendum)
Received faxed PA approval.  Notified [facility].  They will fill for pt.

## 2020-05-07 NOTE — Progress Notes (Signed)
Results have been conveyed to patient via lung cancer screening program.  Have reviewed films independently.

## 2020-05-09 ENCOUNTER — Other Ambulatory Visit: Payer: Self-pay

## 2020-05-09 ENCOUNTER — Encounter: Payer: Self-pay | Admitting: Family Medicine

## 2020-05-09 ENCOUNTER — Emergency Department
Admission: EM | Admit: 2020-05-09 | Discharge: 2020-05-09 | Disposition: A | Discharge status: Home / Self Care | Payer: Medicare HMO | Attending: Emergency Medicine | Admitting: Emergency Medicine

## 2020-05-09 ENCOUNTER — Emergency Department: Payer: Medicare HMO

## 2020-05-09 DIAGNOSIS — Z7982 Long term (current) use of aspirin: Secondary | ICD-10-CM | POA: Diagnosis not present

## 2020-05-09 DIAGNOSIS — Z87891 Personal history of nicotine dependence: Secondary | ICD-10-CM | POA: Insufficient documentation

## 2020-05-09 DIAGNOSIS — E039 Hypothyroidism, unspecified: Secondary | ICD-10-CM | POA: Insufficient documentation

## 2020-05-09 DIAGNOSIS — I2510 Atherosclerotic heart disease of native coronary artery without angina pectoris: Secondary | ICD-10-CM | POA: Diagnosis not present

## 2020-05-09 DIAGNOSIS — I10 Essential (primary) hypertension: Secondary | ICD-10-CM | POA: Diagnosis not present

## 2020-05-09 DIAGNOSIS — Z79899 Other long term (current) drug therapy: Secondary | ICD-10-CM | POA: Insufficient documentation

## 2020-05-09 DIAGNOSIS — J449 Chronic obstructive pulmonary disease, unspecified: Secondary | ICD-10-CM | POA: Diagnosis not present

## 2020-05-09 DIAGNOSIS — R519 Headache, unspecified: Secondary | ICD-10-CM | POA: Diagnosis not present

## 2020-05-09 DIAGNOSIS — E782 Mixed hyperlipidemia: Secondary | ICD-10-CM | POA: Insufficient documentation

## 2020-05-09 DIAGNOSIS — Z794 Long term (current) use of insulin: Secondary | ICD-10-CM | POA: Diagnosis not present

## 2020-05-09 DIAGNOSIS — Z79890 Hormone replacement therapy (postmenopausal): Secondary | ICD-10-CM | POA: Diagnosis not present

## 2020-05-09 DIAGNOSIS — E1169 Type 2 diabetes mellitus with other specified complication: Secondary | ICD-10-CM | POA: Diagnosis not present

## 2020-05-09 DIAGNOSIS — G43001 Migraine without aura, not intractable, with status migrainosus: Secondary | ICD-10-CM | POA: Insufficient documentation

## 2020-05-09 DIAGNOSIS — Z96659 Presence of unspecified artificial knee joint: Secondary | ICD-10-CM | POA: Diagnosis not present

## 2020-05-09 IMAGING — CT CT HEAD W/O CM
3 series · 16 of 47 positions shown, 19 images · non-contrast
Comparison: None.

CLINICAL DATA: Headache

EXAM:
CT HEAD WITHOUT CONTRAST
TECHNIQUE: Contiguous axial images were obtained from the base of the skull
through the vertex without intravenous contrast.

[Series 2: head wo · axial · 0.43mm/px · z∈[+447,+582]mm · 10 of 33 slices shown, 13 images]
[im 3/33  brain]
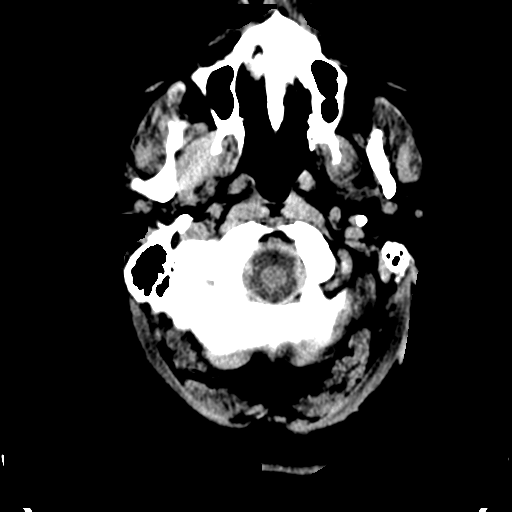
[im 3/33  bone]
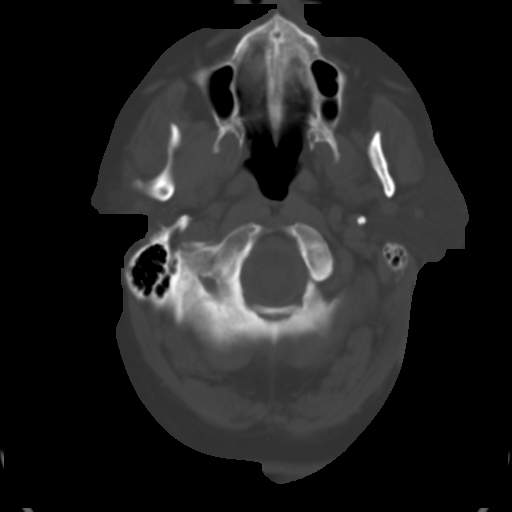
[im 6/33  brain]
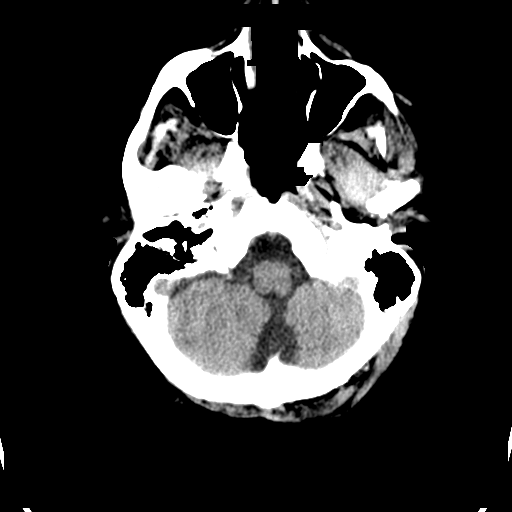
[im 9/33  brain]
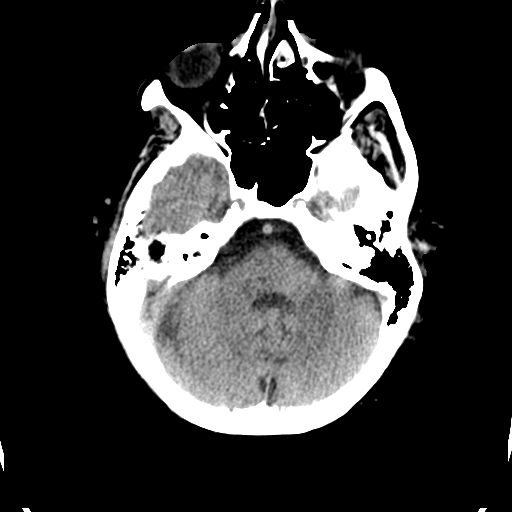
[im 12/33  brain]
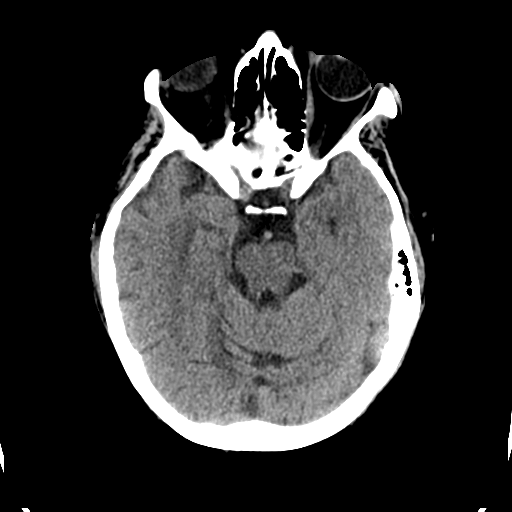
[im 15/33  brain]
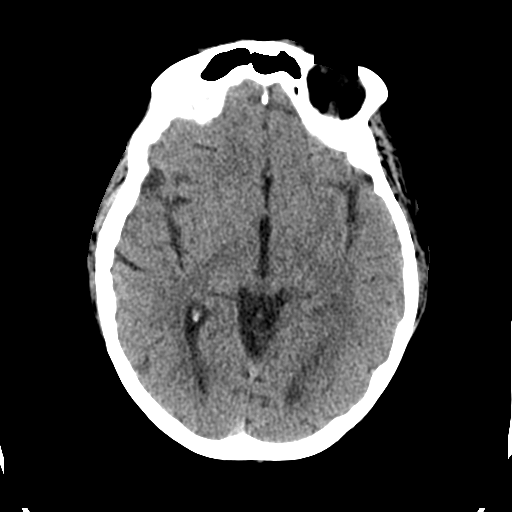
[im 15/33  bone]
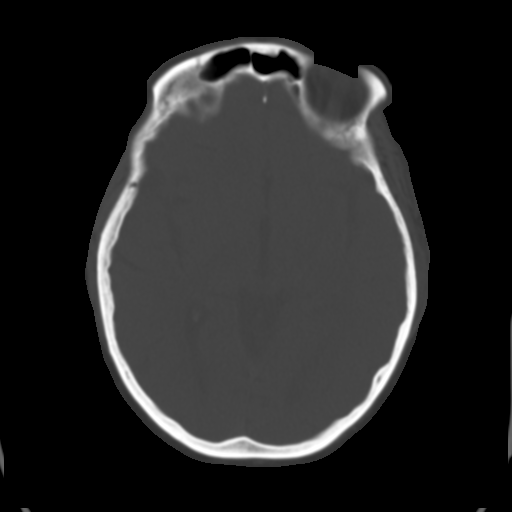
[im 18/33  brain]
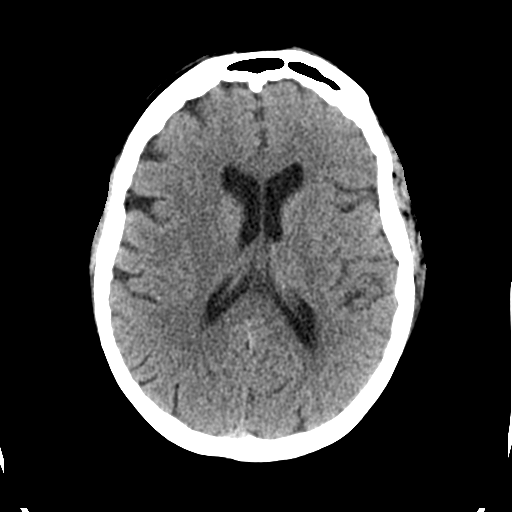
[im 21/33  brain]
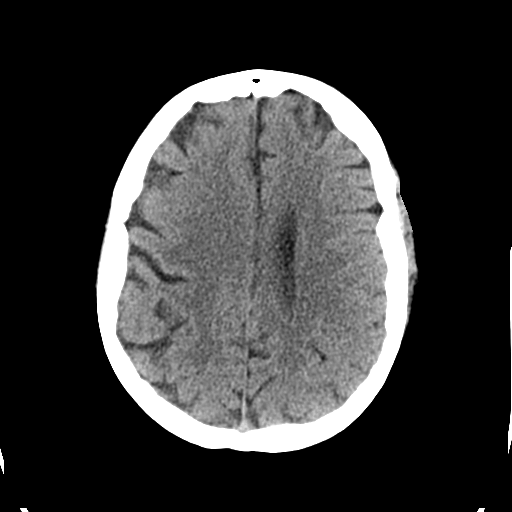
[im 25/33  brain]
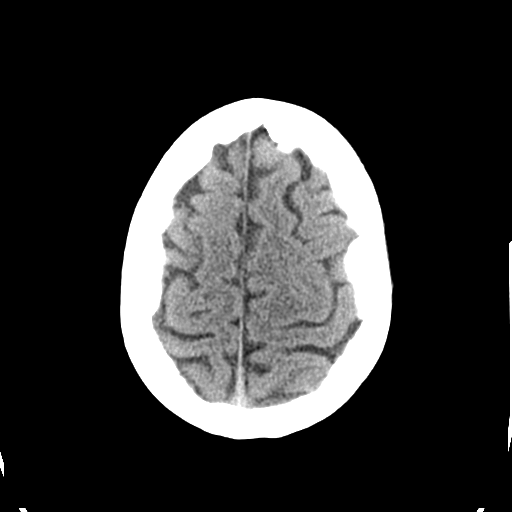
[im 27/33  brain]
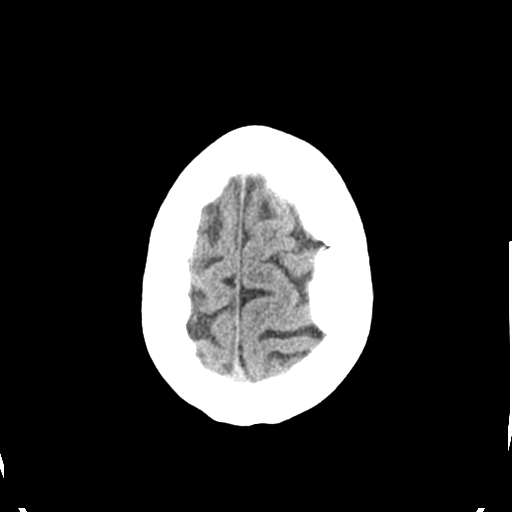
[im 27/33  bone]
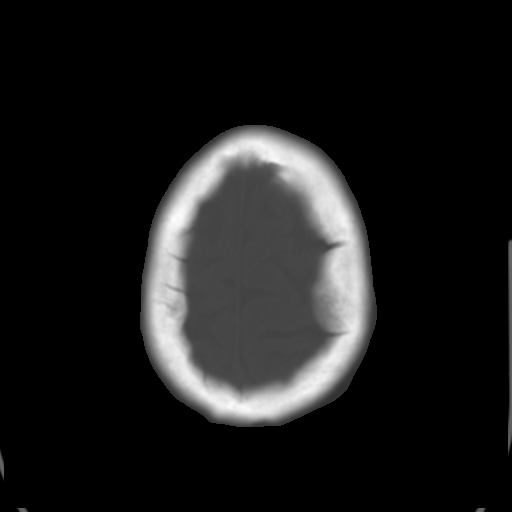
[im 30/33  brain]
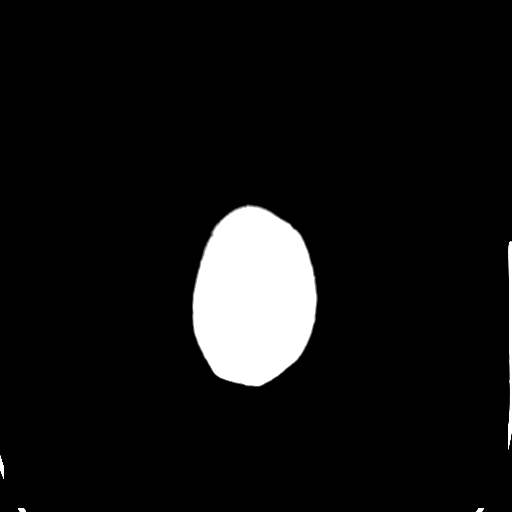

[Series 4: coronal soft tissue · coronal · 0.33mm/px · 3 of 77 slices shown]
[im 26/77  brain]
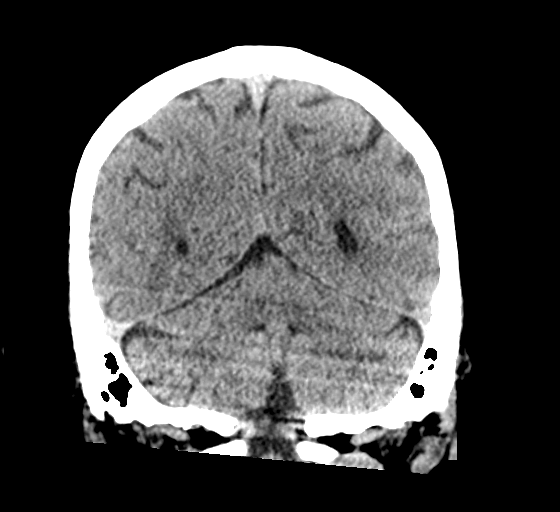
[im 34/77  brain]
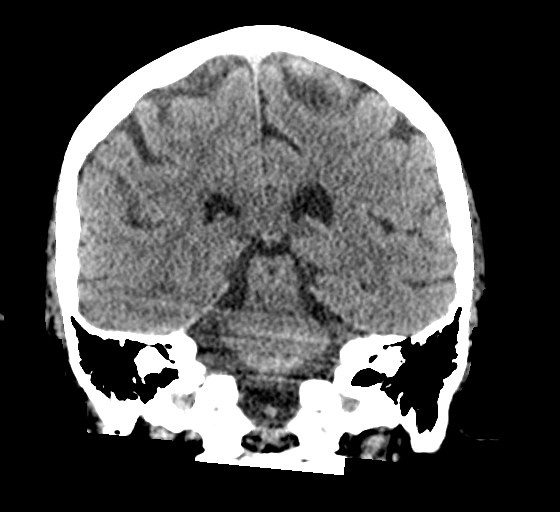
[im 43/77  brain]
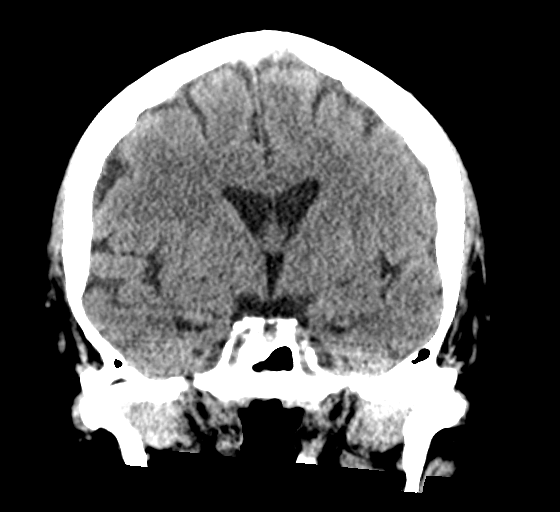

[Series 5: sagittal soft tissue · sagittal · 0.34mm/px · 3 of 63 slices shown]
[im 21/63  brain]
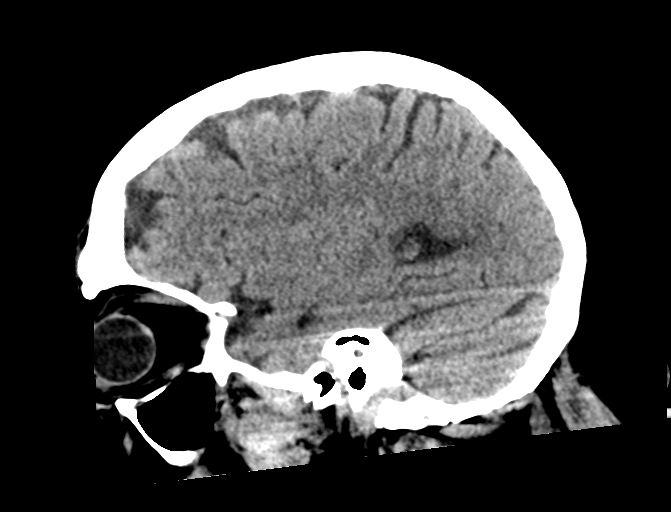
[im 32/63  brain]
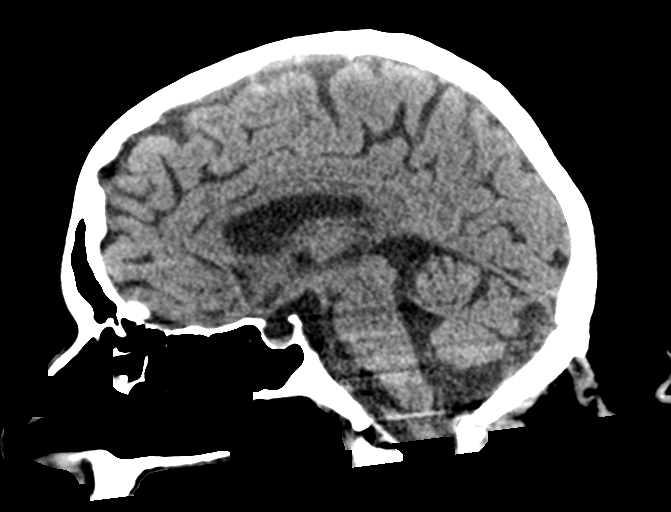
[im 42/63  brain]
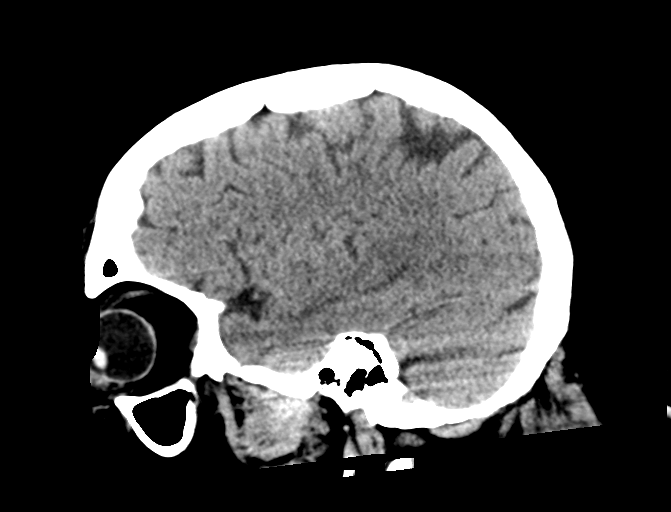

[16 of 47 positions shown; findings below may reference images not displayed]

FINDINGS: Brain: No evidence of acute infarction, hemorrhage, hydrocephalus,
extra-axial collection or mass lesion/mass effect. Mild
periventricular and deep white matter hypodensity.

Vascular: No hyperdense vessel or unexpected calcification.

Skull: Normal. Negative for fracture or focal lesion.

Sinuses/Orbits: No acute finding.

Other: None.
IMPRESSION: No acute intracranial pathology. Small-vessel white matter disease.

## 2020-05-09 MED ORDER — BUTALBITAL-APAP-CAFFEINE 50-325-40 MG PO TABS: 1.0000 | ORAL_TABLET | Freq: Four times a day (QID) | ORAL | 0 refills | Status: DC | PRN

## 2020-05-09 MED ORDER — DIPHENHYDRAMINE HCL 50 MG/ML IJ SOLN
25.0000 mg | Freq: Once | INTRAMUSCULAR | Status: AC
  Administered 2020-05-09: 25 mg via INTRAVENOUS
  Filled 2020-05-09: qty 1

## 2020-05-09 MED ORDER — METOCLOPRAMIDE HCL 5 MG/ML IJ SOLN
10.0000 mg | Freq: Once | INTRAMUSCULAR | Status: AC
  Administered 2020-05-09: 10 mg via INTRAVENOUS
  Filled 2020-05-09: qty 2

## 2020-05-09 MED ORDER — KETOROLAC TROMETHAMINE 30 MG/ML IJ SOLN
30.0000 mg | Freq: Once | INTRAMUSCULAR | Status: AC
  Administered 2020-05-09: 30 mg via INTRAVENOUS
  Filled 2020-05-09: qty 1

## 2020-05-09 NOTE — Discharge Instructions (Addendum)
You were seen today for headache.  The CT of your head was negative for acute findings.  I am sending you home with a prescription for Fioricet to take as needed for headaches.  Please follow-up with your PCP if symptoms persist or worsen.

## 2020-05-09 NOTE — ED Notes (Signed)
See triage note   Presents with headache  Pain is to right side of head  States pain started 2 days ago  Denies any fever or trauma

## 2020-05-09 NOTE — ED Provider Notes (Signed)
[facility] Emergency Department Provider Note ____________________________________________  Time seen: 1530  I have reviewed the triage vital signs and the nursing notes.  HISTORY  Chief Complaint  Headache   HPI [name] is a 69 y.o. female presents to the ER today with complaint of headache.  She reports this started 2 days ago.  The pain is located in her right posterior scalp.  She describes the pain as dull, achy and throbbing.  The pain does radiate into the base of her neck.  She reports this is the worst headache she has ever had, history of migraines 10 years ago.  She does have some slight dizziness and sensitivity to light but denies sensitivity to sound, nausea or vomiting.  She has a history of chronic neck pain, has had 2 injections in the last 6 months.  She has taken Excedrin Migraine and Flexeril as needed with minimal relief of symptoms.  Past Medical History:  Diagnosis Date  . Anxiety   . Bipolar disorder ([place])    has stopped seeing psychiatrist  . Chronic bronchitis   . Coronary artery disease, non-occlusive   . Dyspnea 2012   s/p pulm/cards w/u WNL, thought due to obesity/deconditioning  . Fibromyalgia   . GERD (gastroesophageal reflux disease)   . History of chicken pox   . HLD (hyperlipidemia)   . Hypertension   . Hypothyroidism   . Migraine   . OSA (obstructive sleep apnea) 03/16/2011  . T2DM (type 2 diabetes mellitus) ([place]) 2012   DM education 06/2011  . Urinary incontinence     Patient Active Problem List   Diagnosis Date Noted  . Thoracic aorta atherosclerosis ([place]) 04/15/2020  . COPD (chronic obstructive pulmonary disease) ([place]) 04/15/2020  . Pill dysphagia 04/05/2020  . Cervical neck pain with evidence of disc disease 10/23/2019  . Left hip pain 10/23/2019  . Right knee pain 10/23/2019  . Osteoarthritis 10/23/2019  . Anemia 10/23/2019  . Abnormal weight loss 10/05/2019  . Hepatic steatosis 05/03/2019  .  Thrombocytosis ([place]) 04/24/2019  . Abdominal pain 04/24/2019  . Headache 04/19/2018  . Obesity, morbid, BMI 40.0-49.9 ([place]) 08/17/2017  . Palpitations 01/08/2017  . Mass of right side of neck 07/10/2016  . Neutrophilia 04/30/2015  . Advanced care planning/counseling discussion 01/22/2015  . Health maintenance examination 01/22/2015  . Medicare annual wellness visit, subsequent 12/15/2013  . Tachycardia 09/13/2012  . Osteoarthritis of left knee 12/11/2011  . [place] intertrigo 05/10/2011  . Hypertension   . Bipolar disorder ([place])   . Fibromyalgia   . Hyperlipidemia associated with type 2 diabetes mellitus ([place])   . GERD (gastroesophageal reflux disease)   . Hypothyroidism 04/05/2011  . OSA on CPAP 03/16/2011  . CAD (coronary artery disease) 03/16/2011  . Diabetes mellitus type 2, controlled, with complications ([place]) 09/81/1914  . Dyspnea 01/03/2011    Past Surgical History:  Procedure Laterality Date  . BREAST CYST ASPIRATION Right 2003  . cardiac catherization  03/2011   x3 [facility]), mild nonobstructive CAD  . COLONOSCOPY  12/2011   hyperplastic polyp x2,. diverticulosis, rec rpt 10 yrs  . ESOPHAGOGASTRODUODENOSCOPY  2006  . KNEE SURGERY  2006   left, torn meniscus  . TOTAL ABDOMINAL HYSTERECTOMY  2004   fibroids, cervix remained  . TOTAL KNEE ARTHROPLASTY Left 08/2012   [name], [place] [facility]    Prior to Admission medications   Medication Sig Start Date End Date Taking? Authorizing Provider  Acetaminophen (TYLENOL ARTHRITIS PAIN PO) Take by mouth in  the morning and at bedtime.    [provider]  APPLE CIDER VINEGAR PO Take by mouth.    [provider]  aspirin EC 81 MG tablet Take 81 mg by mouth daily.     [provider]  Boswellia-Glucosamine-Vit D (OSTEO BI-FLEX ONE PER DAY PO) Take by mouth daily.    [provider]  butalbital-acetaminophen-caffeine (FIORICET) [phone] MG tablet Take 1-2 tablets by mouth every 6 (six) hours  as needed for headache. 05/09/20 05/09/21  Jearld Fenton, NP  carvedilol (COREG) 3.125 MG tablet TAKE 1 TABLET TWICE A DAY  WITH MEALS 06/16/19   Ria Bush, MD  cyclobenzaprine (FLEXERIL) 5 MG tablet Take 1-2 tablets (5-10 mg total) by mouth 2 (two) times daily as needed (migraine). 05/05/20   Ria Bush, MD  famotidine (PEPCID) 20 MG tablet Take 20 mg by mouth daily. Alternates with omeprazole    [provider]  glimepiride (AMARYL) 1 MG tablet Take 1 tablet (1 mg total) by mouth daily with breakfast. 03/22/20   Ria Bush, MD  hydrochlorothiazide (HYDRODIURIL) 25 MG tablet TAKE 1 TABLET DAILY 06/16/19   Ria Bush, MD  INS SYRINGE/NEEDLE 1CC/28G (B-D INS SYR MICROFINE 1CC/28G) 28G X 1/2" 1 ML MISC USE AS DIRECTED 12/15/19   Ria Bush, MD  insulin NPH Human (NOVOLIN N) 100 UNIT/ML injection Inject 0.4 mLs (40 Units total) into the skin at bedtime. 10/03/19   Ria Bush, MD  levothyroxine (SYNTHROID) 100 MCG tablet TAKE 1 TABLET DAILY, AND ON2 DAYS A WEEK, TAKE AN     EXTRA 1/2 TABLET 07/23/19   Ria Bush, MD  lisinopril (ZESTRIL) 5 MG tablet TAKE 1 TABLET DAILY 07/23/19   Ria Bush, MD  loratadine (CLARITIN) 10 MG tablet Take 1 tablet (10 mg total) by mouth daily. 03/22/20   Ria Bush, MD  metFORMIN (GLUCOPHAGE) 500 MG tablet TAKE 2 TABLETS 2 TIMES     DAILY WITH MEALS 12/15/19   Ria Bush, MD  MISC NATURAL PRODUCTS PO Take by mouth daily. [place] Menopause Relief    [provider]  Multiple Vitamin (MULTIVITAMIN) tablet Take 1 tablet by mouth daily.    [provider]  Naproxen Sodium 220 MG CAPS Take 440 mg by mouth 2 (two) times daily.     [provider]  nystatin cream (MYCOSTATIN) APPLY TO AFFECTED AREA TWICE A DAY 04/24/19   Ria Bush, MD  omeprazole (PRILOSEC) 20 MG capsule Take 1 daily-Alternates with famotidine 04/29/19   Ria Bush, MD  ONE [facility] LANCETS MISC Use to check  sugar twice daily Dx: E11.8 11/15/16   Ria Bush, MD  [facility] ULTRA test strip USE TO CHECK BLOOD SUGAR 2 TIMES   DAILY 08/25/19   Ria Bush, MD  pioglitazone (ACTOS) 15 MG tablet TAKE 1 TABLET DAILY 05/05/20   Ria Bush, MD  simvastatin (ZOCOR) 40 MG tablet TAKE 1 TABLET AT BEDTIME 04/19/20   Ria Bush, MD  umeclidinium-vilanterol [facility] ELLIPTA) 62.5-25 MCG/INH AEPB Inhale 1 puff into the lungs daily. 04/30/20   Tyler Pita, MD  ziprasidone (GEODON) 40 MG capsule TAKE 1 CAPSULE TWICE DAILY WITH MEALS 04/19/20   Ria Bush, MD    Allergies Crestor [rosuvastatin], Lipitor [atorvastatin], Lithium, and Quetiapine  Family History  Problem Relation Age of Onset  . Asthma Maternal Uncle   . Cancer Maternal Uncle        colon  . Aneurysm Maternal Uncle   . Colon cancer Maternal Uncle 21  . Hypertension Mother   .  Stroke Mother   . Arthritis Mother   . COPD Sister   . Acute lymphoblastic leukemia Cousin 3       died at age 69  . Cancer Father 71       brain tumor  . Diabetes Father   . Thyroid disease Father   . Cancer Maternal Aunt 30       breast  . Breast cancer Maternal Aunt   . Cancer Paternal Uncle        prostate  . Coronary artery disease Neg Hx   . Stomach cancer Neg Hx     Social History Social History   Tobacco Use  . Smoking status: Former Smoker    Packs/day: 2.00    Years: 43.00    Pack years: 86.00    Types: Cigarettes    Quit date: 05/17/2010    Years since quitting: 9.9  . Smokeless tobacco: Never Used  Vaping Use  . Vaping Use: Never used  Substance Use Topics  . Alcohol use: No    Alcohol/week: 0.0 standard drinks  . Drug use: No    Review of Systems  Constitutional: Negative for fever, chills or body aches. Eyes: Positive for sensitivity to light.  Negative for visual changes. Cardiovascular: Negative for chest pain or chest tightness. Respiratory: Negative for cough or shortness of  breath. Gastrointestinal: Negative for nausea. Musculoskeletal: Positive for chronic neck pain. Skin: Negative for rash. Neurological: Positive for headache.  Negative for focal weakness. ____________________________________________  PHYSICAL EXAM:  VITAL SIGNS: ED Triage Vitals  Enc Vitals Group     BP 05/09/20 1224 (!) 155/71     Pulse Rate 05/09/20 1224 91     Resp 05/09/20 1224 18     Temp 05/09/20 1224 97.9 F (36.6 C)     Temp Source 05/09/20 1224 Oral     SpO2 05/09/20 1224 95 %     Weight 05/09/20 1225 241 lb (109.3 kg)     Height 05/09/20 1225 5\' 1"  (1.549 m)     Head Circumference --      Peak Flow --      Pain Score 05/09/20 1225 9     Pain Loc --      Pain Edu? --      Excl. in [place]? --     Constitutional: Alert and oriented.  Appears in pain but in no distress. Head: Normocephalic and atraumatic. Eyes: Conjunctivae are normal. PERRL. Normal extraocular movements Cardiovascular: Normal rate, regular rhythm.  Respiratory: Normal respiratory effort. No wheezes/rales/rhonchi. Musculoskeletal: Normal flexion, extension and rotation of the cervical spine.  Bony tenderness noted over the cervical spine.  Shoulder shrugs equal. Neurologic:  Normal speech and language. No gross focal neurologic deficits are appreciated. Skin:  Skin is warm, dry and intact. No rash noted. ____________________________________________   RADIOLOGY  Imaging Orders     CT Head Wo Contrast IMPRESSION:  No acute intracranial pathology. Small-vessel white matter disease.    ____________________________________________    INITIAL IMPRESSION / ASSESSMENT AND PLAN / ED COURSE  Migraine Headache:  CT head negative Toradol 30 mg IV x 1 Benadryl 25 mg IV x 1 Reglan 10 mg IV x 1 RX for Fioricet to take as needed for breakthrough Follow up with PCP if symptoms persist, return to the ER if symptoms worsen     I reviewed the patient's prescription history over the last 12 months in the  multi-state controlled substances database(s) that includes [place], [place], [place], [place], [place], [place],  [place], [place], [place], [place], [place], [place], [place], and [place].  Results were notable for Tramadol 50 mg #60 11/17/19, #14, 11/11/19, #30 10/22/19. ____________________________________________  FINAL CLINICAL IMPRESSION(S) / ED DIAGNOSES  Final diagnoses:  Migraine without aura and with status migrainosus, not intractable      Jearld Fenton, NP 05/09/20 1543    Naaman Plummer, MD 05/10/20 [phone]

## 2020-05-09 NOTE — ED Triage Notes (Signed)
Pt states that she has a right sided headache that has been going for the past 2 days and getting worse, pt states that the right side of her head has an indention and she has never noticed that before  Hx of migraines in the past

## 2020-05-10 ENCOUNTER — Encounter: Payer: Self-pay | Admitting: Physician Assistant

## 2020-05-10 ENCOUNTER — Encounter: Payer: Self-pay | Admitting: Family Medicine

## 2020-05-10 ENCOUNTER — Ambulatory Visit: Payer: Medicare HMO | Admitting: Physician Assistant

## 2020-05-10 VITALS — BP 132/72 | HR 101 | Ht 61.0 in | Wt 248.4 lb

## 2020-05-10 DIAGNOSIS — R197 Diarrhea, unspecified: Secondary | ICD-10-CM | POA: Diagnosis not present

## 2020-05-10 DIAGNOSIS — R1310 Dysphagia, unspecified: Secondary | ICD-10-CM

## 2020-05-10 NOTE — Progress Notes (Signed)
Chief Complaint: Dysphagia and diarrhea  HPI:    [name] is a 69 year old female, known to Dr. Fuller Plan, with a past medical history as listed below including OSA and CAD, who was referred to me by Ria Bush, MD for a complaint of dysphagia and diarrhea.      01/10/2012 colonoscopy with a 4 mm sessile polyp in the sigmoid colon and a 7 mm sessile polyp in the rectum.  Pathology revealed hyperplastic polyps.  Repeat colonoscopy recommended in 10 years.    03/23/2020 barium swallow for dysphagia showed no significant aspiration but patient did have difficulty swallowing pills and applesauce.  She thought saw speech pathology who recommended she crush or break open her pills and follow-up with [place] for further evaluation of reflux etc.    03/24/2020 referral for dysphagia from ENT, Dr. Tami Ribas.  She had a lower endoscopy which was normal.      Today, the patient presents clinic accompanied by her husband who does assist with her history.  She tells me that over the past 3 to 4 months she has had trouble with her throat telling that she is constantly clearing her throat and dry heaving/gagging after eating anything including pills.  She has had evaluation as above and was told to follow-up with [place] for further evaluation.  Tells me that she can get foods down as long as she chews well, but is constantly gagging/dry heaving.  Along with this has been experiencing diarrhea 2-3 times a week.  On those days she will have 9-10 loose stools depending on what she eats.  She has not noticed a pattern though she has been keeping a food journal.  Currently taking Pepcid 20 mg every other day and Omeprazole 20 mg every other day under instruction from her PCP to do so.  Denies any overt heartburn or reflux symptoms.    Denies fever, chills, blood in her stool, vomiting or symptoms that awaken her from sleep.    Past Medical History:  Diagnosis Date  . Anxiety   . Bipolar disorder ([place])    has stopped seeing  psychiatrist  . Chronic bronchitis   . Coronary artery disease, non-occlusive   . Dyspnea 2012   s/p pulm/cards w/u WNL, thought due to obesity/deconditioning  . Fibromyalgia   . GERD (gastroesophageal reflux disease)   . History of chicken pox   . HLD (hyperlipidemia)   . Hypertension   . Hypothyroidism   . Migraine   . OSA (obstructive sleep apnea) 03/16/2011  . T2DM (type 2 diabetes mellitus) ([place]) 2012   DM education 06/2011  . Urinary incontinence     Past Surgical History:  Procedure Laterality Date  . BREAST CYST ASPIRATION Right 2003  . cardiac catherization  03/2011   x3 [facility]), mild nonobstructive CAD  . COLONOSCOPY  12/2011   hyperplastic polyp x2,. diverticulosis, rec rpt 10 yrs  . ESOPHAGOGASTRODUODENOSCOPY  2006  . KNEE SURGERY  2006   left, torn meniscus  . TOTAL ABDOMINAL HYSTERECTOMY  2004   fibroids, cervix remained  . TOTAL KNEE ARTHROPLASTY Left 08/2012   [name], [facility]    Current Outpatient Medications  Medication Sig Dispense Refill  . Acetaminophen (TYLENOL ARTHRITIS PAIN PO) Take by mouth in the morning and at bedtime.    . APPLE CIDER VINEGAR PO Take by mouth.    Marland Kitchen aspirin EC 81 MG tablet Take 81 mg by mouth daily.     . [place]-Glucosamine-Vit D (OSTEO BI-FLEX ONE PER DAY  PO) Take by mouth daily.    . butalbital-acetaminophen-caffeine (FIORICET) 50-325-40 MG tablet Take 1-2 tablets by mouth every 6 (six) hours as needed for headache. 10 tablet 0  . carvedilol (COREG) 3.125 MG tablet TAKE 1 TABLET TWICE A DAY  WITH MEALS 180 tablet 3  . cyclobenzaprine (FLEXERIL) 5 MG tablet Take 1-2 tablets (5-10 mg total) by mouth 2 (two) times daily as needed (migraine). 30 tablet 1  . famotidine (PEPCID) 20 MG tablet Take 20 mg by mouth daily. Alternates with omeprazole    . glimepiride (AMARYL) 1 MG tablet Take 1 tablet (1 mg total) by mouth daily with breakfast. 90 tablet 1  . hydrochlorothiazide (HYDRODIURIL) 25 MG tablet TAKE 1 TABLET DAILY 90  tablet 3  . INS SYRINGE/NEEDLE 1CC/28G (B-D INS SYR MICROFINE 1CC/28G) 28G X 1/2" 1 ML MISC USE AS DIRECTED 100 each 3  . insulin NPH Human (NOVOLIN N) 100 UNIT/ML injection Inject 0.4 mLs (40 Units total) into the skin at bedtime. 36 mL 3  . levothyroxine (SYNTHROID) 100 MCG tablet TAKE 1 TABLET DAILY, AND ON2 DAYS A WEEK, TAKE AN     EXTRA 1/2 TABLET 90 tablet 3  . lisinopril (ZESTRIL) 5 MG tablet TAKE 1 TABLET DAILY 90 tablet 3  . loratadine (CLARITIN) 10 MG tablet Take 1 tablet (10 mg total) by mouth daily.    . metFORMIN (GLUCOPHAGE) 500 MG tablet TAKE 2 TABLETS 2 TIMES     DAILY WITH MEALS 360 tablet 1  . MISC NATURAL PRODUCTS PO Take by mouth daily. Amberen Menopause Relief    . Multiple Vitamin (MULTIVITAMIN) tablet Take 1 tablet by mouth daily.    . Naproxen Sodium 220 MG CAPS Take 440 mg by mouth 2 (two) times daily.     Marland Kitchen nystatin cream (MYCOSTATIN) APPLY TO AFFECTED AREA TWICE A DAY 30 g 1  . omeprazole (PRILOSEC) 20 MG capsule Take 1 daily-Alternates with famotidine 90 capsule 2  . ONE TOUCH LANCETS MISC Use to check sugar twice daily Dx: E11.8 200 each 3  . ONETOUCH ULTRA test strip USE TO CHECK BLOOD SUGAR 2 TIMES   DAILY 200 strip 3  . pioglitazone (ACTOS) 15 MG tablet TAKE 1 TABLET DAILY 90 tablet 0  . simvastatin (ZOCOR) 40 MG tablet TAKE 1 TABLET AT BEDTIME 90 tablet 0  . umeclidinium-vilanterol (ANORO ELLIPTA) 62.5-25 MCG/INH AEPB Inhale 1 puff into the lungs daily. 60 each 5  . ziprasidone (GEODON) 40 MG capsule TAKE 1 CAPSULE TWICE DAILY WITH MEALS 180 capsule 1   No current facility-administered medications for this visit.    Allergies as of 05/10/2020 - Review Complete 05/09/2020  Allergen Reaction Noted  . Crestor [rosuvastatin] Other (See Comments) 07/14/2012  . Lipitor [atorvastatin] Other (See Comments) 07/14/2012  . Lithium Other (See Comments) 01/03/2011  . Quetiapine Other (See Comments) 04/18/2011    Family History  Problem Relation Age of Onset  .  Asthma Maternal Uncle   . Cancer Maternal Uncle        colon  . Aneurysm Maternal Uncle   . Colon cancer Maternal Uncle 11  . Hypertension Mother   . Stroke Mother   . Arthritis Mother   . COPD Sister   . Acute lymphoblastic leukemia Cousin 3       died at age 82  . Cancer Father 33       brain tumor  . Diabetes Father   . Thyroid disease Father   . Cancer Maternal Aunt 48  breast  . Breast cancer Maternal Aunt   . Cancer Paternal Uncle        prostate  . Coronary artery disease Neg Hx   . Stomach cancer Neg Hx     Social History   Socioeconomic History  . Marital status: Married    Spouse name: Not on file  . Number of children: Not on file  . Years of education: Not on file  . Highest education level: Not on file  Occupational History  . Occupation: disabled    Employer: OTHER  Tobacco Use  . Smoking status: Former Smoker    Packs/day: 2.00    Years: 43.00    Pack years: 86.00    Types: Cigarettes    Quit date: 05/17/2010    Years since quitting: 9.9  . Smokeless tobacco: Never Used  Vaping Use  . Vaping Use: Never used  Substance and Sexual Activity  . Alcohol use: No    Alcohol/week: 0.0 standard drinks  . Drug use: No  . Sexual activity: Yes    Partners: Male  Other Topics Concern  . Not on file  Social History Narrative   Caffeine: 2-3 coffee/am, 1 cup tea in afternoon; Lives with husband, 1 dog, 2 cats; Occupation: disability since 2009 for bipolar, previously worked at [employer]; Diet: not many fruits, good vegetables, good amt water.      Smoker 43 years; quit in 2011. No alcohol. Lives in [place]; with husband.    Social Determinants of Health   Financial Resource Strain:   . Difficulty of Paying Living Expenses: Not on file  Food Insecurity:   . Worried About Charity fundraiser in the Last Year: Not on file  . Ran Out of Food in the Last Year: Not on file  Transportation Needs:   . Lack of Transportation (Medical): Not on file  . Lack  of Transportation (Non-Medical): Not on file  Physical Activity:   . Days of Exercise per Week: Not on file  . Minutes of Exercise per Session: Not on file  Stress:   . Feeling of Stress : Not on file  Social Connections:   . Frequency of Communication with Friends and Family: Not on file  . Frequency of Social Gatherings with Friends and Family: Not on file  . Attends Religious Services: Not on file  . Active Member of Clubs or Organizations: Not on file  . Attends Archivist Meetings: Not on file  . Marital Status: Not on file  Intimate Partner Violence:   . Fear of Current or Ex-Partner: Not on file  . Emotionally Abused: Not on file  . Physically Abused: Not on file  . Sexually Abused: Not on file    Review of Systems:    Constitutional: No weight loss, fever or chills Skin: No rash  Cardiovascular: No chest pain Respiratory: No SOB  Gastrointestinal: See HPI and otherwise negative Genitourinary: No dysuria Neurological: No headache, dizziness or syncope Musculoskeletal: No new muscle or joint pain Hematologic: No bleeding  Psychiatric: No history of depression or anxiety   Physical Exam:  Vital signs: BP 132/72 (BP Location: Right Arm, Patient Position: Sitting, Cuff Size: Normal)   Pulse (!) 101   Ht 5\' 1"  (1.549 m)   Wt 274 lb 8 oz (124.5 kg)   SpO2 96%   BMI 51.87 kg/m    Constitutional:   Pleasant obese Caucasian female appears to be in NAD, Well developed, Well nourished, alert and cooperative Head:  Normocephalic and atraumatic. Eyes:   PEERL, EOMI. No icterus. Conjunctiva pink. Ears:  Normal auditory acuity. Neck:  Supple Throat: Oral cavity and pharynx without inflammation, swelling or lesion.  Respiratory: Respirations even and unlabored. Lungs clear to auscultation bilaterally.   No wheezes, crackles, or rhonchi.  Cardiovascular: Normal S1, S2. No MRG. Regular rate and rhythm. No peripheral edema, cyanosis or pallor.  Gastrointestinal:  Soft,  nondistended, nontender. No rebound or guarding. Normal bowel sounds. No appreciable masses or hepatomegaly. Rectal:  Not performed.  Msk:  Symmetrical without gross deformities. Without edema, no deformity or joint abnormality.  Neurologic:  Alert and  oriented x4;  grossly normal neurologically.  Skin:   Dry and intact without significant lesions or rashes. Psychiatric: Demonstrates good judgement and reason without abnormal affect or behaviors.  RELEVANT LABS AND IMAGING: CBC    Component Value Date/Time   WBC 15.6 (H) 10/20/2019 1206   RBC 4.11 10/20/2019 1206   HGB 11.9 (L) 10/20/2019 1206   HGB 10.6 (L) 08/30/2012 0604   HCT 37.0 10/20/2019 1206   HCT 31.9 (L) 08/30/2012 0604   PLT 429 (H) 10/20/2019 1206   PLT 292 08/30/2012 0604   MCV 90.0 10/20/2019 1206   MCV 95 08/30/2012 0604   MCH 29.0 10/20/2019 1206   MCHC 32.2 10/20/2019 1206   RDW 14.0 10/20/2019 1206   RDW 13.4 08/30/2012 0604   LYMPHSABS 3.0 10/20/2019 1206   LYMPHSABS 1.9 08/30/2012 0604   MONOABS 1.0 10/20/2019 1206   MONOABS 1.2 (H) 08/30/2012 0604   EOSABS 0.0 10/20/2019 1206   EOSABS 0.1 08/30/2012 0604   BASOSABS 0.1 10/20/2019 1206   BASOSABS 0.1 08/30/2012 0604    CMP     Component Value Date/Time   NA 133 (L) 10/20/2019 1206   NA 134 (L) 08/30/2012 0604   K 3.7 10/20/2019 1206   K 3.9 08/30/2012 0604   CL 93 (L) 10/20/2019 1206   CL 102 08/30/2012 0604   CO2 24 10/20/2019 1206   CO2 25 08/30/2012 0604   GLUCOSE 112 (H) 10/20/2019 1206   GLUCOSE 182 (H) 08/30/2012 0604   BUN 19 10/20/2019 1206   BUN 9 08/30/2012 0604   CREATININE 0.79 10/20/2019 1206   CREATININE 0.93 07/30/2015 1652   CALCIUM 9.6 10/20/2019 1206   CALCIUM 8.4 (L) 08/30/2012 0604   PROT 8.0 10/20/2019 1206   ALBUMIN 4.3 10/20/2019 1206   AST 21 10/20/2019 1206   ALT 19 10/20/2019 1206   ALKPHOS 42 10/20/2019 1206   BILITOT 0.4 10/20/2019 1206   GFRNONAA >60 10/20/2019 1206   GFRNONAA >60 08/30/2012 0604   GFRAA  >60 10/20/2019 1206   GFRAA >60 08/30/2012 0604    Assessment: 1.  Dysphagia: Gagging, some trouble with pills, swallow eval was inconclusive; consider stricture versus other 2.  Diarrhea: Worse over the past 3 to 4 months occurs 2 to 3 days out of the week, other days has solid stool; consider IBS versus relation to above  Plan: 1.  Scheduled patient for an EGD with dilation at the hospital due to BMI with Dr. Fuller Plan at his next available which is in November.  Did discuss risks, benefits, limitations and alternatives and the patient agrees to proceed.  She will be Covid tested prior to time of procedure. 2.  Reviewed antidysphagia measures. 3.  Continue Omeprazole and Pepcid as prescribed currently. 4.  Patient to follow in clinic per recommendations from Dr. Fuller Plan after time of procedure.  Ellouise Newer, PA-C [facility] 05/10/2020, 1:31 PM  Cc: Ria Bush, MD

## 2020-05-10 NOTE — Patient Instructions (Signed)
If you are age 67 or older, your body mass index should be between 23-30. Your Body mass index is 51.87 kg/m. If this is out of the aforementioned range listed, please consider follow up with your Primary Care Provider.  If you are age 49 or younger, your body mass index should be between 19-25. Your Body mass index is 51.87 kg/m. If this is out of the aformentioned range listed, please consider follow up with your Primary Care Provider.   You have been scheduled for an endoscopy. Please follow written instructions given to you at your visit today. If you use inhalers (even only as needed), please bring them with you on the day of your procedure.  Continue your Pepcid and Omeprazole as prescribed.  Follow up pending the results of your Endoscopy.

## 2020-05-10 NOTE — Progress Notes (Signed)
Reviewed and agree with management plan. In addition please schedule barium esophagram with tablet at least 2 days prior to EGD.  Pricilla Riffle. Fuller Plan, MD [facility]

## 2020-05-11 ENCOUNTER — Telehealth: Payer: Self-pay

## 2020-05-11 ENCOUNTER — Encounter: Payer: Self-pay | Admitting: Oncology

## 2020-05-11 DIAGNOSIS — R1310 Dysphagia, unspecified: Secondary | ICD-10-CM

## 2020-05-11 NOTE — Telephone Encounter (Signed)
-----   Message from Levin Erp, [place] sent at 05/11/2020  9:31 AM EDT ----- Regarding: FW: barium swallow [name] please order barium swallow with tablet.  Dr. Fuller Plan, we will get it done.  JLL ----- Message ----- From: Ladene Artist, MD Sent: 05/11/2020   9:09 AM EDT To: Levin Erp, PA Subject: RE: barium swallow                             She had a modified barium swallow for speech pathology which does not image the entire esophagus. Please proceed with a barium esophagram with tablet.  ----- Message ----- From: Levin Erp, PA Sent: 05/11/2020   9:05 AM EDT To: Ladene Artist, MD Subject: barium swallow                                 Already had barium swallow. See HPI.  JLL ----- Message ----- From: Ladene Artist, MD Sent: 05/10/2020   4:26 PM EDT To: Levin Erp, PA    ----- Message ----- From: Levin Erp, [place] Sent: 05/10/2020   2:23 PM EDT To: Ladene Artist, MD

## 2020-05-11 NOTE — Telephone Encounter (Signed)
I adjusted the weight. Her BMI is now below 50. Did she have any other reasons to have her procedure at the hospital?

## 2020-05-11 NOTE — Telephone Encounter (Signed)
The pt has been advised and instructed.      You have been scheduled for a Barium Esophogram at [facility] (1st floor of the hospital) on 05/25/20 at 1030 am. Please arrive 15 minutes prior to your appointment for registration. Make certain not to have anything to eat or drink 3 hours prior to your test. If you need to reschedule for any reason, please contact radiology at [phone] to do so. ______________________________________________ A barium swallow is an examination that concentrates on views of the esophagus. This tends to be a double contrast exam (barium and two liquids which, when combined, create a gas to distend the wall of the oesophagus) or single contrast (non-ionic iodine based). The study is usually tailored to your symptoms so a good history is essential. Attention is paid during the study to the form, structure and configuration of the esophagus, looking for functional disorders (such as aspiration, dysphagia, achalasia, motility and reflux) EXAMINATION You may be asked to change into a gown, depending on the type of swallow being performed. A radiologist and radiographer will perform the procedure. The radiologist will advise you of the type of contrast selected for your procedure and direct you during the exam. You will be asked to stand, sit or lie in several different positions and to hold a small amount of fluid in your mouth before being asked to swallow while the imaging is performed .In some instances you may be asked to swallow barium coated marshmallows to assess the motility of a solid food bolus. The exam can be recorded as a digital or video fluoroscopy procedure. POST PROCEDURE It will take 1-2 days for the barium to pass through your system. To facilitate this, it is important, unless otherwise directed, to increase your fluids for the next 24-48hrs and to resume your normal diet.  This test typically takes about 30 minutes to  perform. ______________________________________________

## 2020-05-11 NOTE — Telephone Encounter (Signed)
[name] this pt called in to notify that her weight was incorrectly documented at her office visit yesterday. She said it was noted as 274.8 lbs. However the correct weight was 248.7 lbs.  She wanted me to pass that on to you.  It may change her location of her procedure due to a decrease in BMI.

## 2020-05-11 NOTE — Telephone Encounter (Signed)
Estill Bamberg this was one of the afternoon patient's with [facility] yesterday.

## 2020-05-13 NOTE — Telephone Encounter (Signed)
Yes, that was the only reason, please adjust location to [place].  Thanks-JLL

## 2020-05-14 NOTE — Telephone Encounter (Signed)
Spoke with patient and advised that due to the change in her BMI that we would be able to change the location of her EGD. I have scheduled her appointment for 06/03/20 and cancelled the hospital case. I also advised her that I would send her new directions through My Chart.

## 2020-05-14 NOTE — Addendum Note (Signed)
Addended by: Aleatha Borer on: 05/14/2020 03:26 PM   Modules accepted: Orders

## 2020-05-19 ENCOUNTER — Other Ambulatory Visit: Payer: Self-pay | Admitting: Family Medicine

## 2020-05-20 ENCOUNTER — Other Ambulatory Visit: Payer: Self-pay | Admitting: Family Medicine

## 2020-05-20 DIAGNOSIS — D72829 Elevated white blood cell count, unspecified: Secondary | ICD-10-CM

## 2020-05-20 DIAGNOSIS — E1169 Type 2 diabetes mellitus with other specified complication: Secondary | ICD-10-CM

## 2020-05-20 DIAGNOSIS — Z794 Long term (current) use of insulin: Secondary | ICD-10-CM

## 2020-05-20 DIAGNOSIS — E039 Hypothyroidism, unspecified: Secondary | ICD-10-CM

## 2020-05-20 DIAGNOSIS — E785 Hyperlipidemia, unspecified: Secondary | ICD-10-CM

## 2020-05-20 DIAGNOSIS — F3178 Bipolar disorder, in full remission, most recent episode mixed: Secondary | ICD-10-CM

## 2020-05-21 ENCOUNTER — Other Ambulatory Visit: Payer: Self-pay | Admitting: Family Medicine

## 2020-05-21 ENCOUNTER — Other Ambulatory Visit: Payer: Self-pay

## 2020-05-21 ENCOUNTER — Other Ambulatory Visit (INDEPENDENT_AMBULATORY_CARE_PROVIDER_SITE_OTHER): Payer: Medicare HMO

## 2020-05-21 DIAGNOSIS — F3178 Bipolar disorder, in full remission, most recent episode mixed: Secondary | ICD-10-CM

## 2020-05-21 DIAGNOSIS — E785 Hyperlipidemia, unspecified: Secondary | ICD-10-CM | POA: Diagnosis not present

## 2020-05-21 DIAGNOSIS — E118 Type 2 diabetes mellitus with unspecified complications: Secondary | ICD-10-CM | POA: Diagnosis not present

## 2020-05-21 DIAGNOSIS — D72829 Elevated white blood cell count, unspecified: Secondary | ICD-10-CM

## 2020-05-21 DIAGNOSIS — Z794 Long term (current) use of insulin: Secondary | ICD-10-CM | POA: Diagnosis not present

## 2020-05-21 DIAGNOSIS — E1169 Type 2 diabetes mellitus with other specified complication: Secondary | ICD-10-CM | POA: Diagnosis not present

## 2020-05-21 DIAGNOSIS — E039 Hypothyroidism, unspecified: Secondary | ICD-10-CM

## 2020-05-21 DIAGNOSIS — R69 Illness, unspecified: Secondary | ICD-10-CM | POA: Diagnosis not present

## 2020-05-21 HISTORY — PX: ESOPHAGOGASTRODUODENOSCOPY: SHX1529

## 2020-05-21 LAB — LIPID PANEL
Cholesterol: 131 mg/dL (ref 0–200)
HDL: 42.8 mg/dL (ref >39.00)
NonHDL: 88.05
Total CHOL/HDL Ratio: 3
Triglycerides: 239 mg/dL — ABNORMAL HIGH (ref 0.0–149.0)
VLDL: 47.8 mg/dL — ABNORMAL HIGH (ref 0.0–40.0)

## 2020-05-21 LAB — COMPREHENSIVE METABOLIC PANEL
ALT: 17 U/L (ref 0–35)
AST: 17 U/L (ref 0–37)
Albumin: 4.3 g/dL (ref 3.5–5.2)
Alkaline Phosphatase: 50 U/L (ref 39–117)
BUN: 13 mg/dL (ref 6–23)
CO2: 31 mEq/L (ref 19–32)
Calcium: 10 mg/dL (ref 8.4–10.5)
Chloride: 97 mEq/L (ref 96–112)
Creatinine, Ser: 0.78 mg/dL (ref 0.40–1.20)
GFR: 73.52 mL/min (ref >60.00)
Glucose, Bld: 117 mg/dL — ABNORMAL HIGH (ref 70–99)
Potassium: 4.4 mEq/L (ref 3.5–5.1)
Sodium: 138 mEq/L (ref 135–145)
Total Bilirubin: 0.3 mg/dL (ref 0.2–1.2)
Total Protein: 7.4 g/dL (ref 6.0–8.3)

## 2020-05-21 LAB — TSH: TSH: 2.79 u[IU]/mL (ref 0.35–4.50)

## 2020-05-21 LAB — HEMOGLOBIN A1C: Hgb A1c MFr Bld: 6.4 % (ref 4.6–6.5)

## 2020-05-21 LAB — LDL CHOLESTEROL, DIRECT: Direct LDL: 65 mg/dL

## 2020-05-21 NOTE — Addendum Note (Signed)
Addended by: Ellamae Sia on: 05/21/2020 09:11 AM   Modules accepted: Orders

## 2020-05-24 ENCOUNTER — Ambulatory Visit: Payer: Medicare HMO

## 2020-05-24 LAB — CBC WITH DIFFERENTIAL/PLATELET
Absolute Monocytes: 683 cells/uL (ref 200–950)
Basophils Absolute: 90 cells/uL (ref 0–200)
Basophils Relative: 0.8 %
Eosinophils Absolute: 0 cells/uL — ABNORMAL LOW (ref 15–500)
Eosinophils Relative: 0 %
HCT: 38.8 % (ref 35.0–45.0)
Hemoglobin: 13.2 g/dL (ref 11.7–15.5)
Lymphs Abs: 2341 cells/uL (ref 850–3900)
MCH: 31.1 pg (ref 27.0–33.0)
MCHC: 34 g/dL (ref 32.0–36.0)
MCV: 91.5 fL (ref 80.0–100.0)
MPV: 10.2 fL (ref 7.5–12.5)
Monocytes Relative: 6.1 %
Neutro Abs: 8086 cells/uL — ABNORMAL HIGH (ref 1500–7800)
Neutrophils Relative %: 72.2 %
Platelets: 366 10*3/uL (ref 140–400)
RBC: 4.24 10*6/uL (ref 3.80–5.10)
RDW: 13.3 % (ref 11.0–15.0)
Total Lymphocyte: 20.9 %
WBC: 11.2 10*3/uL — ABNORMAL HIGH (ref 3.8–10.8)

## 2020-05-24 LAB — PATHOLOGIST SMEAR REVIEW

## 2020-05-25 ENCOUNTER — Other Ambulatory Visit: Payer: Self-pay | Admitting: Family Medicine

## 2020-05-25 ENCOUNTER — Ambulatory Visit (HOSPITAL_COMMUNITY)
Admission: RE | Admit: 2020-05-25 | Discharge: 2020-05-25 | Disposition: A | Discharge status: Home / Self Care | Payer: Medicare HMO | Source: Ambulatory Visit | Attending: Physician Assistant | Admitting: Physician Assistant

## 2020-05-25 ENCOUNTER — Other Ambulatory Visit: Payer: Self-pay

## 2020-05-25 DIAGNOSIS — Z1231 Encounter for screening mammogram for malignant neoplasm of breast: Secondary | ICD-10-CM

## 2020-05-25 DIAGNOSIS — K224 Dyskinesia of esophagus: Secondary | ICD-10-CM | POA: Diagnosis not present

## 2020-05-25 DIAGNOSIS — R1310 Dysphagia, unspecified: Secondary | ICD-10-CM

## 2020-05-26 ENCOUNTER — Telehealth: Payer: Self-pay | Admitting: Gastroenterology

## 2020-05-26 ENCOUNTER — Ambulatory Visit (INDEPENDENT_AMBULATORY_CARE_PROVIDER_SITE_OTHER): Payer: Medicare HMO | Admitting: Family Medicine

## 2020-05-26 ENCOUNTER — Encounter: Payer: Self-pay | Admitting: Family Medicine

## 2020-05-26 VITALS — BP 138/76 | HR 90 | Temp 98.0°F | Ht 62.0 in | Wt 250.3 lb

## 2020-05-26 DIAGNOSIS — G43909 Migraine, unspecified, not intractable, without status migrainosus: Secondary | ICD-10-CM

## 2020-05-26 DIAGNOSIS — J432 Centrilobular emphysema: Secondary | ICD-10-CM

## 2020-05-26 DIAGNOSIS — E118 Type 2 diabetes mellitus with unspecified complications: Secondary | ICD-10-CM

## 2020-05-26 DIAGNOSIS — K219 Gastro-esophageal reflux disease without esophagitis: Secondary | ICD-10-CM | POA: Diagnosis not present

## 2020-05-26 DIAGNOSIS — Z0000 Encounter for general adult medical examination without abnormal findings: Secondary | ICD-10-CM | POA: Diagnosis not present

## 2020-05-26 DIAGNOSIS — E1169 Type 2 diabetes mellitus with other specified complication: Secondary | ICD-10-CM | POA: Diagnosis not present

## 2020-05-26 DIAGNOSIS — Z23 Encounter for immunization: Secondary | ICD-10-CM | POA: Diagnosis not present

## 2020-05-26 DIAGNOSIS — D75839 Thrombocytosis, unspecified: Secondary | ICD-10-CM

## 2020-05-26 DIAGNOSIS — K760 Fatty (change of) liver, not elsewhere classified: Secondary | ICD-10-CM

## 2020-05-26 DIAGNOSIS — M150 Primary generalized (osteo)arthritis: Secondary | ICD-10-CM

## 2020-05-26 DIAGNOSIS — R1310 Dysphagia, unspecified: Secondary | ICD-10-CM

## 2020-05-26 DIAGNOSIS — M8949 Other hypertrophic osteoarthropathy, multiple sites: Secondary | ICD-10-CM

## 2020-05-26 DIAGNOSIS — E039 Hypothyroidism, unspecified: Secondary | ICD-10-CM | POA: Diagnosis not present

## 2020-05-26 DIAGNOSIS — E2839 Other primary ovarian failure: Secondary | ICD-10-CM

## 2020-05-26 DIAGNOSIS — M5090 Cervical disc disorder, unspecified, unspecified cervical region: Secondary | ICD-10-CM

## 2020-05-26 DIAGNOSIS — D729 Disorder of white blood cells, unspecified: Secondary | ICD-10-CM

## 2020-05-26 DIAGNOSIS — I700 Atherosclerosis of aorta: Secondary | ICD-10-CM

## 2020-05-26 DIAGNOSIS — I10 Essential (primary) hypertension: Secondary | ICD-10-CM

## 2020-05-26 DIAGNOSIS — E785 Hyperlipidemia, unspecified: Secondary | ICD-10-CM

## 2020-05-26 DIAGNOSIS — Z794 Long term (current) use of insulin: Secondary | ICD-10-CM

## 2020-05-26 DIAGNOSIS — M159 Polyosteoarthritis, unspecified: Secondary | ICD-10-CM

## 2020-05-26 DIAGNOSIS — F3178 Bipolar disorder, in full remission, most recent episode mixed: Secondary | ICD-10-CM

## 2020-05-26 DIAGNOSIS — Z7189 Other specified counseling: Secondary | ICD-10-CM

## 2020-05-26 DIAGNOSIS — R69 Illness, unspecified: Secondary | ICD-10-CM | POA: Diagnosis not present

## 2020-05-26 DIAGNOSIS — D72828 Other elevated white blood cell count: Secondary | ICD-10-CM

## 2020-05-26 MED ORDER — BUTALBITAL-APAP-CAFFEINE 50-325-40 MG PO TABS: 1.0000 | ORAL_TABLET | Freq: Two times a day (BID) | ORAL | 1 refills | Status: DC | PRN

## 2020-05-26 MED ORDER — TOPIRAMATE 25 MG PO TABS: 25.0000 mg | ORAL_TABLET | Freq: Every evening | ORAL | 3 refills | Status: DC

## 2020-05-26 MED ORDER — TOPIRAMATE 25 MG PO TABS
25.0000 mg | ORAL_TABLET | Freq: Every evening | ORAL | 3 refills | Status: DC
Start: 2020-05-26 — End: 2020-06-07

## 2020-05-26 NOTE — Telephone Encounter (Signed)
I have not contacted patient. I think you reached out to patient with results, Patty.

## 2020-05-26 NOTE — Patient Instructions (Addendum)
Flu shot today I will order DEXA scan to get done at same time as mammogram.  Work on setting up advanced directives.  Try topamax 25mg  nightly for migraine prevention. Let me know if worsening to consider headache center. fioricet refilled. Continue vitamin B12 daily.  Return in 4 months for follow up visit   Health Maintenance After Age 67 After age 75, you are at a higher risk for certain long-term diseases and infections as well as injuries from falls. Falls are a major cause of broken bones and head injuries in people who are older than age 67. Getting regular preventive care can help to keep you healthy and well. Preventive care includes getting regular testing and making lifestyle changes as recommended by your health care provider. Talk with your health care provider about:  Which screenings and tests you should have. A screening is a test that checks for a disease when you have no symptoms.  A diet and exercise plan that is right for you. What should I know about screenings and tests to prevent falls? Screening and testing are the best ways to find a health problem early. Early diagnosis and treatment give you the best chance of managing medical conditions that are common after age 3. Certain conditions and lifestyle choices may make you more likely to have a fall. Your health care provider may recommend:  Regular vision checks. Poor vision and conditions such as cataracts can make you more likely to have a fall. If you wear glasses, make sure to get your prescription updated if your vision changes.  Medicine review. Work with your health care provider to regularly review all of the medicines you are taking, including over-the-counter medicines. Ask your health care provider about any side effects that may make you more likely to have a fall. Tell your health care provider if any medicines that you take make you feel dizzy or sleepy.  Osteoporosis screening. Osteoporosis is a condition  that causes the bones to get weaker. This can make the bones weak and cause them to break more easily.  Blood pressure screening. Blood pressure changes and medicines to control blood pressure can make you feel dizzy.  Strength and balance checks. Your health care provider may recommend certain tests to check your strength and balance while standing, walking, or changing positions.  Foot health exam. Foot pain and numbness, as well as not wearing proper footwear, can make you more likely to have a fall.  Depression screening. You may be more likely to have a fall if you have a fear of falling, feel emotionally low, or feel unable to do activities that you used to do.  Alcohol use screening. Using too much alcohol can affect your balance and may make you more likely to have a fall. What actions can I take to lower my risk of falls? General instructions  Talk with your health care provider about your risks for falling. Tell your health care provider if: ? You fall. Be sure to tell your health care provider about all falls, even ones that seem minor. ? You feel dizzy, sleepy, or off-balance.  Take over-the-counter and prescription medicines only as told by your health care provider. These include any supplements.  Eat a healthy diet and maintain a healthy weight. A healthy diet includes low-fat dairy products, low-fat (lean) meats, and fiber from whole grains, beans, and lots of fruits and vegetables. Home safety  Remove any tripping hazards, such as rugs, cords, and clutter.  Install  safety equipment such as grab bars in bathrooms and safety rails on stairs.  Keep rooms and walkways well-lit. Activity   Follow a regular exercise program to stay fit. This will help you maintain your balance. Ask your health care provider what types of exercise are appropriate for you.  If you need a cane or walker, use it as recommended by your health care provider.  Wear supportive shoes that have  nonskid soles. Lifestyle  Do not drink alcohol if your health care provider tells you not to drink.  If you drink alcohol, limit how much you have: ? 0-1 drink a day for women. ? 0-2 drinks a day for men.  Be aware of how much alcohol is in your drink. In the [place] one drink equals one typical bottle of beer (12 oz), one-half glass of wine (5 oz), or one shot of hard liquor (1 oz).  Do not use any products that contain nicotine or tobacco, such as cigarettes and e-cigarettes. If you need help quitting, ask your health care provider. Summary  Having a healthy lifestyle and getting preventive care can help to protect your health and wellness after age 25.  Screening and testing are the best way to find a health problem early and help you avoid having a fall. Early diagnosis and treatment give you the best chance for managing medical conditions that are more common for people who are older than age 40.  Falls are a major cause of broken bones and head injuries in people who are older than age 21. Take precautions to prevent a fall at home.  Work with your health care provider to learn what changes you can make to improve your health and wellness and to prevent falls. This information is not intended to replace advice given to you by your health care provider. Make sure you discuss any questions you have with your health care provider. Document Revised: 11/28/2018 Document Reviewed: 06/20/2017 Elsevier Patient Education  2020 [employer].

## 2020-05-26 NOTE — Assessment & Plan Note (Signed)
Advanced directives: does not have set in place. Packet provided previously and again today. Husband would be HCPOA.

## 2020-05-26 NOTE — Telephone Encounter (Signed)
E prescribing error - phoned in topamax.

## 2020-05-26 NOTE — Addendum Note (Signed)
Addended by: Ria Bush on: 05/26/2020 06:31 PM   Modules accepted: Orders

## 2020-05-26 NOTE — Assessment & Plan Note (Signed)

## 2020-05-26 NOTE — Assessment & Plan Note (Signed)
Preventative protocols reviewed and updated unless pt declined. Discussed healthy diet and lifestyle.  

## 2020-05-26 NOTE — Telephone Encounter (Signed)
  The pt has been advised and will proceed with EGD as planned.   Please let patient know that barium swallow showed some possible difficulty with the actual muscles that help with swallowing, this may be something that benefits from a speech pathologist eval in the future. We will see what EGD shows.  FYI Dr. Oretha Caprice

## 2020-05-27 NOTE — Assessment & Plan Note (Signed)
Pending EGD next week.

## 2020-05-27 NOTE — Assessment & Plan Note (Signed)
Thyroid levels stable on levothyroxine 157mcg daily.

## 2020-05-27 NOTE — Assessment & Plan Note (Signed)
Migraines seem to be recurring, recent ER eval for this with reassuring head CT. Unclear cause/trigger. Will Rx fioricet abortively, start topamax preventatively 25mg  nightly (watch for worsening paresthesias). Avoid triptans due to cardiovascular risk.

## 2020-05-27 NOTE — Assessment & Plan Note (Signed)
Chronic, stable on current regimen.  

## 2020-05-27 NOTE — Assessment & Plan Note (Addendum)
Stable period on metformin and actos and amaryl. Continues novolin N 40u at bedtime.

## 2020-05-27 NOTE — Assessment & Plan Note (Signed)
Has established with pulm [name]) - started on anoro ellipta.

## 2020-05-27 NOTE — Assessment & Plan Note (Signed)
Chronic, s/p heme evaluation earlier in the year. Labwork returned reassuring (10/20/2019).  Recommended CT chest/abd/pelvis - but insurance denied this.

## 2020-05-27 NOTE — Progress Notes (Signed)
This visit was conducted in person.  BP 138/76 (BP Location: Left Arm, Patient Position: Sitting, Cuff Size: Large)   Pulse 90   Temp 98 F (36.7 C) (Temporal)   Ht 5\' 2"  (1.575 m)   Wt 250 lb 5 oz (113.5 kg)   SpO2 95%   BMI 45.78 kg/m    CC: CPE Subjective:    Patient ID: [name], female    DOB: Jul 22, 1951, 69 y.o.   MRN: [id number]  HPI: [name] is a 69 y.o. female presenting on 05/26/2020 for Medicare Wellness (Requesting rx for [employer]. C/o still having HA. )   Did not see health advisor this year.               Hearing Screening   125Hz  250Hz  500Hz  1000Hz  2000Hz  3000Hz  4000Hz  6000Hz  8000Hz   Right ear:   0 0 0  0    Left ear:   40 40 40  0    Comments: Heard practice tone in right ear at 25 dBHL.  Vision Screening Comments: Last eye exam, 06/2019.    Office Visit from 05/26/2020 in [place] at [facility] Total Score 0    Denies trouble hearing but failed R ear hearing screen.  Fall Risk  05/26/2020 04/24/2019 04/15/2018 04/10/2017 04/07/2016  Falls in the past year? 0 0 No No No    Planned EGD for ongoing dysphagia. Recent barium swallow done yesterday.   Recent ER visit for migraine managed with migraine cocktail IV and fioricet with benefit. Head CT reassuringly ok (small vessel disease). Notes increasing headache frequency since mid last month - up to 3-4/wk. No nausea/vomiting but does have photo/phonophobia and they're activity limiting. Triggers are unknown. Has started wearing sunglasses. Previously saw headache clinic. Doesn't remember preventative regimen. Interested in trial of ppx medication.   Notes ongoing paresthesias to feet.  No results found for: VITAMINB12  She is taking vit B12 oral replacement at 2519mcg daily  Preventative: COLONOSCOPY Date: 12/2011 hyperplastic polyp x2,. diverticulosis, rec rpt 10 yrs. MammogramWNL 06/2019.  Well woman - pap 2015 WNL. S/p total hysterectomy with bilat  oophorectomy (fibroids) but cervix remains. Bladder sling placed at that time (2014).Always normal pap smears.We attempted pap smear 2019 unsuccessfully - pt declines repeat. Lung cancer screening - undergoing - started 03/2020 DEXA scan - due - will schedule Flu yearly Pneumovax 2012, prevnar 2019  Td 2012  COVID vaccine - Moderna 09/2019, 10/2019  zostavax- 2015 shingrix -completed 2019 Advanced directives: does not have set in place. Packet provided previously and again today. Husband would be HCPOA.  Seat belt use discussed  Sunscreen use discussed. No changing moles.  Quit smoking 2011(1-2 ppd) 30+ PY hx.  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 Diet: not many fruits, good vegetables, good amt water     Relevant past medical, surgical, family and social history reviewed and updated as indicated. Interim medical history since our last visit reviewed. Allergies and medications reviewed and updated. Outpatient Medications Prior to Visit  Medication Sig Dispense Refill  . Acetaminophen (TYLENOL ARTHRITIS PAIN PO) Take by mouth in the morning and at bedtime.    . APPLE CIDER VINEGAR PO Take by mouth.    Marland Kitchen  aspirin EC 81 MG tablet Take 81 mg by mouth daily.     . [place]-Glucosamine-Vit D (OSTEO BI-FLEX ONE PER DAY PO) Take by mouth daily.    . carvedilol (COREG) 3.125 MG tablet TAKE 1 TABLET TWICE A DAY  WITH MEALS 180 tablet 0  . famotidine (PEPCID) 20 MG tablet Take 20 mg by mouth daily. Alternates with omeprazole    . glimepiride (AMARYL) 1 MG tablet Take 1 tablet (1 mg total) by mouth daily with breakfast. 90 tablet 1  . hydrochlorothiazide (HYDRODIURIL) 25 MG tablet TAKE 1 TABLET DAILY 90 tablet 0    . INS SYRINGE/NEEDLE 1CC/28G (B-D INS SYR MICROFINE 1CC/28G) 28G X 1/2" 1 ML MISC USE AS DIRECTED 100 each 3  . insulin NPH Human (NOVOLIN N) 100 UNIT/ML injection Inject 0.4 mLs (40 Units total) into the skin at bedtime. 36 mL 3  . levothyroxine (SYNTHROID) 100 MCG tablet TAKE 1 TABLET DAILY, AND ON2 DAYS A WEEK, TAKE AN     EXTRA 1/2 TABLET 90 tablet 0  . lisinopril (ZESTRIL) 5 MG tablet TAKE 1 TABLET DAILY 90 tablet 3  . loratadine (CLARITIN) 10 MG tablet Take 1 tablet (10 mg total) by mouth daily.    . metFORMIN (GLUCOPHAGE) 500 MG tablet TAKE 2 TABLETS 2 TIMES     DAILY WITH MEALS 360 tablet 1  . MISC NATURAL PRODUCTS PO Take by mouth daily. Amberen Menopause Relief    . Multiple Vitamin (MULTIVITAMIN) tablet Take 1 tablet by mouth daily.    . Naproxen Sodium 220 MG CAPS Take 440 mg by mouth 2 (two) times daily.     Marland Kitchen nystatin cream (MYCOSTATIN) APPLY TO AFFECTED AREA TWICE A DAY 30 g 1  . omeprazole (PRILOSEC) 20 MG capsule TAKE 1 DAILY-ALTERNATES WITH FAMOTIDINE 90 capsule 0  . ONE TOUCH LANCETS MISC Use to check sugar twice daily Dx: E11.8 200 each 3  . ONETOUCH ULTRA test strip USE TO CHECK BLOOD SUGAR 2 TIMES   DAILY 200 strip 3  . pioglitazone (ACTOS) 15 MG tablet TAKE 1 TABLET DAILY 90 tablet 0  . simvastatin (ZOCOR) 40 MG tablet TAKE 1 TABLET AT BEDTIME 90 tablet 0  . umeclidinium-vilanterol (ANORO ELLIPTA) 62.5-25 MCG/INH AEPB Inhale 1 puff into the lungs daily. 60 each 5  . ziprasidone (GEODON) 40 MG capsule TAKE 1 CAPSULE TWICE DAILY WITH MEALS 180 capsule 1  . butalbital-acetaminophen-caffeine (FIORICET) 50-325-40 MG tablet Take 1-2 tablets by mouth every 6 (six) hours as needed for headache. (Patient not taking: Reported on 05/26/2020) 10 tablet 0  . cyclobenzaprine (FLEXERIL) 5 MG tablet Take 1-2 tablets (5-10 mg total) by mouth 2 (two) times daily as needed (migraine). 30 tablet 1   No facility-administered medications prior to visit.     Per HPI unless specifically  indicated in ROS section below Review of Systems  Constitutional: Negative for activity change, appetite change, chills, fatigue, fever and unexpected weight change.  HENT: Negative for hearing loss.   Eyes: Negative for visual disturbance.  Respiratory: Positive for shortness of breath (exertional). Negative for cough, chest tightness and wheezing.   Cardiovascular: Positive for chest pain (recent barium swallow causing increased gas pain). Negative for palpitations and leg swelling.  Gastrointestinal: Positive for diarrhea. Negative for abdominal distention, abdominal pain, blood in stool, constipation, nausea and vomiting.  Genitourinary: Negative for difficulty urinating and hematuria.  Musculoskeletal: Negative for arthralgias, myalgias and neck pain.  Skin: Negative for rash.  Neurological: Positive for headaches. Negative for dizziness,  seizures and syncope.  Hematological: Negative for adenopathy. Does not bruise/bleed easily.  Psychiatric/Behavioral: Negative for dysphoric mood. The patient is not nervous/anxious.    Objective:  BP 138/76 (BP Location: Left Arm, Patient Position: Sitting, Cuff Size: Large)   Pulse 90   Temp 98 F (36.7 C) (Temporal)   Ht 5\' 2"  (1.575 m)   Wt 250 lb 5 oz (113.5 kg)   SpO2 95%   BMI 45.78 kg/m   Wt Readings from Last 3 Encounters:  05/26/20 250 lb 5 oz (113.5 kg)  05/10/20 248 lb 6.4 oz (112.7 kg)  05/09/20 241 lb (109.3 kg)      Physical Exam Vitals and nursing note reviewed.  Constitutional:      General: She is not in acute distress.    Appearance: Normal appearance. She is well-developed. She is obese. She is not ill-appearing.     Comments: Walks with walker, cervical kyphosis, wears sunglasses in office  HENT:     Right Ear: Tympanic membrane, ear canal and external ear normal. There is no impacted cerumen.     Left Ear: Tympanic membrane, ear canal and external ear normal. There is no impacted cerumen.     Nose: Nose normal.    Eyes:     General: No scleral icterus.    Conjunctiva/sclera: Conjunctivae normal.     Pupils: Pupils are equal, round, and reactive to light.  Neck:     Thyroid: No thyroid mass or thyromegaly.     Vascular: No carotid bruit.  Cardiovascular:     Rate and Rhythm: Normal rate and regular rhythm.     Pulses: Normal pulses.          Radial pulses are 2+ on the right side and 2+ on the left side.     Heart sounds: Normal heart sounds. No murmur heard.   Pulmonary:     Effort: Pulmonary effort is normal. No respiratory distress.     Breath sounds: Normal breath sounds. No wheezing, rhonchi or rales.  Abdominal:     General: Abdomen is flat. Bowel sounds are normal. There is no distension.     Palpations: Abdomen is soft. There is no mass.     Tenderness: There is no abdominal tenderness. There is no guarding or rebound.     Hernia: No hernia is present.  Musculoskeletal:        General: Normal range of motion.     Cervical back: Normal range of motion and neck supple.     Right lower leg: No edema.     Left lower leg: No edema.  Lymphadenopathy:     Cervical: No cervical adenopathy.  Skin:    General: Skin is warm and dry.     Findings: No rash.  Neurological:     General: No focal deficit present.     Mental Status: She is alert and oriented to person, place, and time.     Comments: CN grossly intact, station and gait intact  Psychiatric:        Mood and Affect: Mood normal.        Behavior: Behavior normal.        Thought Content: Thought content normal.        Judgment: Judgment normal.       Results for orders placed or performed in visit on 05/21/20  CBC with Differential/Platelet  Result Value Ref Range   WBC 11.2 (H) 3.8 - 10.8 Thousand/uL   RBC 4.24 3.80 - 5.10  Million/uL   Hemoglobin 13.2 11.7 - 15.5 g/dL   HCT 38.8 35 - 45 %   MCV 91.5 80.0 - 100.0 fL   MCH 31.1 27.0 - 33.0 pg   MCHC 34.0 32.0 - 36.0 g/dL   RDW 13.3 11.0 - 15.0 %   Platelets 366 140 - 400  Thousand/uL   MPV 10.2 7.5 - 12.5 fL   Neutro Abs 8,086 (H) 1,500 - 7,800 cells/uL   Lymphs Abs 2,341 850 - 3,900 cells/uL   Absolute Monocytes 683 200 - 950 cells/uL   Eosinophils Absolute 0 (L) 15 - 500 cells/uL   Basophils Absolute 90 0 - 200 cells/uL   Neutrophils Relative % 72.2 %   Total Lymphocyte 20.9 %   Monocytes Relative 6.1 %   Eosinophils Relative 0.0 %   Basophils Relative 0.8 %  Pathologist smear review  Result Value Ref Range   Path Review    Hemoglobin A1c  Result Value Ref Range   Hgb A1c MFr Bld 6.4 4.6 - 6.5 %  TSH  Result Value Ref Range   TSH 2.79 0.35 - 4.50 uIU/mL  Comprehensive metabolic panel  Result Value Ref Range   Sodium 138 135 - 145 mEq/L   Potassium 4.4 3.5 - 5.1 mEq/L   Chloride 97 96 - 112 mEq/L   CO2 31 19 - 32 mEq/L   Glucose, Bld 117 (H) 70 - 99 mg/dL   BUN 13 6 - 23 mg/dL   Creatinine, Ser 0.78 0.40 - 1.20 mg/dL   Total Bilirubin 0.3 0.2 - 1.2 mg/dL   Alkaline Phosphatase 50 39 - 117 U/L   AST 17 0 - 37 U/L   ALT 17 0 - 35 U/L   Total Protein 7.4 6.0 - 8.3 g/dL   Albumin 4.3 3.5 - 5.2 g/dL   GFR 73.52 >60.00 mL/min   Calcium 10.0 8.4 - 10.5 mg/dL  Lipid panel  Result Value Ref Range   Cholesterol 131 0 - 200 mg/dL   Triglycerides 239.0 (H) 0 - 149 mg/dL   HDL 42.80 >39.00 mg/dL   VLDL 47.8 (H) 0.0 - 40.0 mg/dL   Total CHOL/HDL Ratio 3    NonHDL 88.05   LDL cholesterol, direct  Result Value Ref Range   Direct LDL 65.0 mg/dL   Assessment & Plan:  This visit occurred during the SARS-CoV-2 public health emergency.  Safety protocols were in place, including screening questions prior to the visit, additional usage of staff PPE, and extensive cleaning of exam room while observing appropriate contact time as indicated for disinfecting solutions.   Problem List Items Addressed This Visit    Thrombocytosis   Thoracic aorta atherosclerosis (HCC)    Continue aspirin, statin.       Pill dysphagia    Pending EGD next week.        Osteoarthritis   Relevant Medications   butalbital-acetaminophen-caffeine (FIORICET) 50-325-40 MG tablet   Obesity, morbid, BMI 40.0-49.9 (HCC)    Weight gain noted over the last several months.       Neutrophilia    Chronic, s/p heme evaluation earlier in the year. Labwork returned reassuring (10/20/2019).  Recommended CT chest/abd/pelvis - but insurance denied this.       Migraine    Migraines seem to be recurring, recent ER eval for this with reassuring head CT. Unclear cause/trigger. Will Rx fioricet abortively, start topamax preventatively 25mg  nightly (watch for worsening paresthesias). Avoid triptans due to cardiovascular risk.  Relevant Medications   butalbital-acetaminophen-caffeine (FIORICET) 50-325-40 MG tablet   Medicare annual wellness visit, subsequent - Primary    I have personally reviewed the Medicare Annual Wellness questionnaire and have noted 1. The patient's medical and social history 2. Their use of alcohol, tobacco or illicit drugs 3. Their current medications and supplements 4. The patient's functional ability including ADL's, fall risks, home safety risks and hearing or visual impairment. Cognitive function has been assessed and addressed as indicated.  5. Diet and physical activity 6. Evidence for depression or mood disorders The patients weight, height, BMI have been recorded in the chart. I have made referrals, counseling and provided education to the patient based on review of the above and I have provided the pt with a written personalized care plan for preventive services. Provider list updated.. See scanned questionairre as needed for further documentation. Reviewed preventative protocols and updated unless pt declined.       Hypothyroidism    Thyroid levels stable on levothyroxine 160mcg daily.       Hypertension    Chronic, stable on current regimen.       Hyperlipidemia associated with type 2 diabetes mellitus (HCC)    Chronically  elevated triglycerides despite simvastatin increased dose to 40mg . LDL at goal. Could consider adding fibrate in the future.  The 10-year ASCVD risk score Mikey Bussing DC Brooke Bonito., et al., 2013) is: 17.8%   Values used to calculate the score:     Age: 69 years     Sex: Female     Is Non-Hispanic African American: No     Diabetic: Yes     Tobacco smoker: No     Systolic Blood Pressure: 474 mmHg     Is BP treated: Yes     HDL Cholesterol: 42.8 mg/dL     Total Cholesterol: 131 mg/dL       Hepatic steatosis    LFTs stable.       Health maintenance examination    Preventative protocols reviewed and updated unless pt declined. Discussed healthy diet and lifestyle.       GERD (gastroesophageal reflux disease)    Continues alternating pepcid and OTC omeprazole.  Upcoming EGD to further evaluate ongoing dysphagia.       Diabetes mellitus type 2, controlled, with complications ([place])    Stable period on metformin and actos and amaryl. Continues novolin N 40u at bedtime.       COPD (chronic obstructive pulmonary disease) ([place])    Has established with pulm Patsey Berthold) - started on anoro ellipta.       Cervical neck pain with evidence of disc disease    Appreciate PM&R care - s/p steroid injections       Bipolar disorder ([place])    Chronic, stable - long-term on geodon prescribed through our office.       Advanced care planning/counseling discussion    Advanced directives: does not have set in place. Packet provided previously and again today. Husband would be HCPOA.        Other Visit Diagnoses    Need for influenza vaccination       Relevant Orders   Flu Vaccine QUAD High Dose(Fluad) (Completed)   Estrogen deficiency       Relevant Orders   DG Bone Density       Meds ordered this encounter  Medications  . butalbital-acetaminophen-caffeine (FIORICET) 50-325-40 MG tablet    Sig: Take 1 tablet by mouth 2 (two) times daily as needed for headache or  migraine.    Dispense:  20 tablet      Refill:  1  . DISCONTD: topiramate (TOPAMAX) 25 MG tablet    Sig: Take 1 tablet (25 mg total) by mouth at bedtime.    Dispense:  30 tablet    Refill:  3   Orders Placed This Encounter  Procedures  . DG Bone Density    Standing Status:   Future    Standing Expiration Date:   05/27/2021    Scheduling Instructions:     plz schedule on same day as mammogram 06/2020    Order Specific Question:   Reason for Exam (SYMPTOM  OR DIAGNOSIS REQUIRED)    Answer:   osteoporosis screening    Order Specific Question:   Preferred imaging location?    Answer:   [facility]  . Flu Vaccine QUAD High Dose(Fluad)    Patient instructions: Flu shot today I will order DEXA scan to get done at same time as mammogram.  Work on setting up advanced directives.  Try topamax 25mg  nightly for migraine prevention. Let me know if worsening to consider headache center. fioricet refilled. Continue vitamin B12 daily.  Return in 4 months for follow up visit   Follow up plan: Return in about 4 months (around 09/26/2020) for follow up visit.  Ria Bush, MD

## 2020-05-27 NOTE — Assessment & Plan Note (Signed)
Chronically elevated triglycerides despite simvastatin increased dose to 40mg . LDL at goal. Could consider adding fibrate in the future.  The 10-year ASCVD risk score Mikey Bussing DC Brooke Bonito., et al., 2013) is: 17.8%   Values used to calculate the score:     Age: 70 years     Sex: Female     Is Non-Hispanic African American: No     Diabetic: Yes     Tobacco smoker: No     Systolic Blood Pressure: 424 mmHg     Is BP treated: Yes     HDL Cholesterol: 42.8 mg/dL     Total Cholesterol: 131 mg/dL

## 2020-05-27 NOTE — Assessment & Plan Note (Signed)
LFTs stable.

## 2020-05-27 NOTE — Assessment & Plan Note (Signed)
Chronic, stable - long-term on geodon prescribed through our office.

## 2020-05-27 NOTE — Assessment & Plan Note (Signed)
Appreciate PM&R care - s/p steroid injections

## 2020-05-27 NOTE — Assessment & Plan Note (Addendum)
Continues alternating pepcid and OTC omeprazole.  Upcoming EGD to further evaluate ongoing dysphagia.

## 2020-05-27 NOTE — Assessment & Plan Note (Signed)
Weight gain noted over the last several months.

## 2020-05-27 NOTE — Assessment & Plan Note (Signed)
Continue aspirin, statin.  

## 2020-06-01 ENCOUNTER — Encounter: Payer: Self-pay | Admitting: Gastroenterology

## 2020-06-02 ENCOUNTER — Telehealth: Payer: Self-pay

## 2020-06-02 ENCOUNTER — Other Ambulatory Visit: Payer: Self-pay

## 2020-06-02 ENCOUNTER — Ambulatory Visit: Payer: Medicare HMO

## 2020-06-02 NOTE — Telephone Encounter (Signed)
Called patient 3 times trying to complete AWV. Patient never answered. Left message notifying patient I called and appointment was cancelled, can call and reschedule.

## 2020-06-03 ENCOUNTER — Encounter: Payer: Self-pay | Admitting: Gastroenterology

## 2020-06-03 ENCOUNTER — Ambulatory Visit (AMBULATORY_SURGERY_CENTER): Payer: Medicare HMO | Admitting: Gastroenterology

## 2020-06-03 ENCOUNTER — Other Ambulatory Visit: Payer: Self-pay

## 2020-06-03 VITALS — BP 174/96 | HR 96 | Temp 97.1°F | Resp 14 | Ht 61.0 in | Wt 248.0 lb

## 2020-06-03 DIAGNOSIS — K2970 Gastritis, unspecified, without bleeding: Secondary | ICD-10-CM | POA: Diagnosis not present

## 2020-06-03 DIAGNOSIS — R1310 Dysphagia, unspecified: Secondary | ICD-10-CM

## 2020-06-03 DIAGNOSIS — K3189 Other diseases of stomach and duodenum: Secondary | ICD-10-CM | POA: Diagnosis not present

## 2020-06-03 DIAGNOSIS — K319 Disease of stomach and duodenum, unspecified: Secondary | ICD-10-CM | POA: Diagnosis not present

## 2020-06-03 MED ORDER — SODIUM CHLORIDE 0.9 % IV SOLN: 500.0000 mL | Freq: Once | INTRAVENOUS | Status: DC

## 2020-06-03 NOTE — Op Note (Signed)
[facility] Patient Name: [name] Procedure Date: 06/03/2020 10:22 AM MRN: [id number] Endoscopist: Ladene Artist , MD Age: 69 Referring MD:  Date of Birth: [birth year] Gender: Female Account #: [id number] Procedure:                Upper GI endoscopy Indications:              Dysphagia Medicines:                Monitored Anesthesia Care Procedure:                Pre-Anesthesia Assessment:                           - Prior to the procedure, a History and Physical                            was performed, and patient medications and                            allergies were reviewed. The patient's tolerance of                            previous anesthesia was also reviewed. The risks                            and benefits of the procedure and the sedation                            options and risks were discussed with the patient.                            All questions were answered, and informed consent                            was obtained. Prior Anticoagulants: The patient has                            taken no previous anticoagulant or antiplatelet                            agents. ASA Grade Assessment: III - A patient with                            severe systemic disease. After reviewing the risks                            and benefits, the patient was deemed in                            satisfactory condition to undergo the procedure.                           After obtaining informed consent, the endoscope was  passed under direct vision. Throughout the                            procedure, the patient's blood pressure, pulse, and                            oxygen saturations were monitored continuously. The                            Endoscope was introduced through the mouth, and                            advanced to the second part of duodenum. The upper                            GI endoscopy was accomplished without  difficulty.                            The patient tolerated the procedure well. Scope In: Scope Out: Findings:                 No endoscopic abnormality was evident in the                            esophagus to explain the patient's complaint of                            dysphagia. It was decided, however, to proceed with                            dilation of the entire esophagus. A guidewire was                            placed and the scope was withdrawn. Dilation was                            performed with a Savary dilator with no resistance                            at 16 mm.                           A few localized small erosions with no bleeding and                            no stigmata of recent bleeding were found in the                            gastric antrum. Biopsies were taken with a cold                            forceps for histology.  Patchy mildly erythematous mucosa without bleeding                            was found in the gastric body. Biopsies were taken                            with a cold forceps for histology.                           The exam of the stomach was otherwise normal.                           The duodenal bulb and second portion of the                            duodenum were normal. Complications:            No immediate complications. Estimated Blood Loss:     Estimated blood loss was minimal. Impression:               - No endoscopic esophageal abnormality to explain                            patient's dysphagia. Esophagus dilated.                           - Erosive gastropathy with no bleeding and no                            stigmata of recent bleeding. Biopsied.                           - Erythematous mucosa in the gastric body. Biopsied.                           - Normal duodenal bulb and second portion of the                            duodenum. Recommendation:           - Patient has a contact  number available for                            emergencies. The signs and symptoms of potential                            delayed complications were discussed with the                            patient. Return to normal activities tomorrow.                            Written discharge instructions were provided to the                            patient.                           -  Clear liquids for 2 hours then soft diet today.                           - Resume previous diet.                           - Continue present medications.                           - Await pathology results.                           - Esophageal dysmotility is causing her dysphagia. Ladene Artist, MD 06/03/2020 10:46:06 AM This report has been signed electronically.

## 2020-06-03 NOTE — Patient Instructions (Addendum)
Handouts were given to you on The Dilatation Diet to follow the rest of today and Gastritis. Your blood sugar was 131 in the recovery room. You may resume your current medications today. Await biopsy results. Please call if any questions or concerns.     YOU HAD AN ENDOSCOPIC PROCEDURE TODAY AT [facility]:   Refer to the procedure report that was given to you for any specific questions about what was found during the examination.  If the procedure report does not answer your questions, please call your gastroenterologist to clarify.  If you requested that your care partner not be given the details of your procedure findings, then the procedure report has been included in a sealed envelope for you to review at your convenience later.  YOU SHOULD EXPECT: Some feelings of bloating in the abdomen. Passage of more gas than usual.  Walking can help get rid of the air that was put into your GI tract during the procedure and reduce the bloating. If you had a lower endoscopy (such as a colonoscopy or flexible sigmoidoscopy) you may notice spotting of blood in your stool or on the toilet paper. If you underwent a bowel prep for your procedure, you may not have a normal bowel movement for a few days.  Please Note:  You might notice some irritation and congestion in your nose or some drainage.  This is from the oxygen used during your procedure.  There is no need for concern and it should clear up in a day or so.  SYMPTOMS TO REPORT IMMEDIATELY:     Following upper endoscopy (EGD)  Vomiting of blood or coffee ground material  New chest pain or pain under the shoulder blades  Painful or persistently difficult swallowing  New shortness of breath  Fever of 100F or higher  Black, tarry-looking stools  For urgent or emergent issues, a gastroenterologist can be reached at any hour by calling [phone]. Do not use MyChart messaging for urgent concerns.    DIET:  Please follow the  dilatation diet the rest of today.  Handout was given to you to follow today.  Drink plenty of fluids but you should avoid alcoholic beverages for 24 hours.  ACTIVITY:  You should plan to take it easy for the rest of today and you should NOT DRIVE or use heavy machinery until tomorrow (because of the sedation medicines used during the test).    FOLLOW UP: Our staff will call the number listed on your records 48-72 hours following your procedure to check on you and address any questions or concerns that you may have regarding the information given to you following your procedure. If we do not reach you, we will leave a message.  We will attempt to reach you two times.  During this call, we will ask if you have developed any symptoms of COVID 19. If you develop any symptoms (ie: fever, flu-like symptoms, shortness of breath, cough etc.) before then, please call [phone].  If you test positive for Covid 19 in the 2 weeks post procedure, please call and report this information to [place].    If any biopsies were taken you will be contacted by phone or by letter within the next 1-3 weeks.  Please call us at [phone] if you have not heard about the biopsies in 3 weeks.    SIGNATURES/CONFIDENTIALITY: You and/or your care partner have signed paperwork which will be entered into your electronic medical record.  These signatures  attest to the fact that that the information above on your After Visit Summary has been reviewed and is understood.  Full responsibility of the confidentiality of this discharge information lies with you and/or your care-partner.

## 2020-06-03 NOTE — Progress Notes (Signed)
To PACU, VSS. Report to [email or web address] 

## 2020-06-03 NOTE — Progress Notes (Signed)
Pt felt much better in the recovery room. No nausea or headache.  The O2 therepy given to pt in the procedure room made her head feel much better.  MAW  No problems noted in the recovery room. maw

## 2020-06-04 ENCOUNTER — Other Ambulatory Visit: Payer: Self-pay | Admitting: Family Medicine

## 2020-06-04 NOTE — Telephone Encounter (Signed)
Spoke with [facility] mail order to fill metformin.  Says they will process order to be shipped.

## 2020-06-05 ENCOUNTER — Encounter: Payer: Self-pay | Admitting: Family Medicine

## 2020-06-05 DIAGNOSIS — G43909 Migraine, unspecified, not intractable, without status migrainosus: Secondary | ICD-10-CM

## 2020-06-07 ENCOUNTER — Other Ambulatory Visit: Payer: Self-pay

## 2020-06-07 ENCOUNTER — Encounter: Payer: Self-pay | Admitting: Pulmonary Disease

## 2020-06-07 ENCOUNTER — Ambulatory Visit: Payer: Medicare HMO | Admitting: Pulmonary Disease

## 2020-06-07 ENCOUNTER — Telehealth: Payer: Self-pay

## 2020-06-07 VITALS — BP 134/78 | HR 85 | Temp 97.8°F | Ht 62.0 in | Wt 237.0 lb

## 2020-06-07 DIAGNOSIS — I5189 Other ill-defined heart diseases: Secondary | ICD-10-CM | POA: Diagnosis not present

## 2020-06-07 DIAGNOSIS — R0602 Shortness of breath: Secondary | ICD-10-CM

## 2020-06-07 DIAGNOSIS — J449 Chronic obstructive pulmonary disease, unspecified: Secondary | ICD-10-CM | POA: Diagnosis not present

## 2020-06-07 MED ORDER — ALBUTEROL SULFATE HFA 108 (90 BASE) MCG/ACT IN AERS: 2.0000 | INHALATION_SPRAY | Freq: Four times a day (QID) | RESPIRATORY_TRACT | 3 refills | Status: DC | PRN

## 2020-06-07 MED ORDER — TOPIRAMATE 50 MG PO TABS
50.0000 mg | ORAL_TABLET | Freq: Every evening | ORAL | 6 refills | Status: DC
Start: 2020-06-07 — End: 2020-06-17

## 2020-06-07 NOTE — Progress Notes (Signed)
Subjective:    Patient ID: [name], female    DOB: Sep 27, 1950, 69 y.o.   MRN: [id number]  HPI Patient is a 69 year old smoker (quit 2011) who presents for follow-up on the issue of dyspnea on exertion.  She was previously evaluated on 2021.  We had requested PFTs at that time.  She was enrolled on the lung cancer screening program and was instructed to continue Anoro Ellipta.  She presents today mostly to discuss the results of PFTs and to discuss further options.  She has had also issues with significant gastroesophageal reflux.  Patient presents today with the complaint of persistent dyspnea on exertion.  She has had significant weight loss but despite this she still has a BMI of 43.  She is very deconditioned.  She has not had any chest pain.  On a prior echo of 2018 she was noted to have diastolic dysfunction with no overt evidence of pulmonary hypertension.  Pulmonary function testing was performed on 20 April 2020.  FEV1 was 1.70 L or 78% predicted postbronchodilator response no significant change.  FVC 2.36 L or 82% predicted.  FEV1/FVC 72% there was elevation of the RV indicating mild air trapping increased airway resistance consistent with mild obstructive defect.  Low volume loop showing mildly delayed mid flows.  All consistent with very mild COPD.  Diffusion capacity was normal.  The patient's dyspnea is out of proportion to this very mild defect.   Review of Systems A 10 point review of systems was performed and it is as noted above otherwise negative.  Allergies  Allergen Reactions  . Crestor [Rosuvastatin] Other (See Comments)    myalgias  . Lipitor [Atorvastatin] Other (See Comments)    Mood swings  . Lithium Other (See Comments)    abd pain, n/v, was hospitalized  . Quetiapine Other (See Comments)    Muscle twitching, muscle spasm   Current Meds  Medication Sig  . Acetaminophen (TYLENOL ARTHRITIS PAIN PO) Take by mouth in the morning and at bedtime.  . APPLE  CIDER VINEGAR PO Take by mouth.  Marland Kitchen aspirin EC 81 MG tablet Take 81 mg by mouth daily.   . [place]-Glucosamine-Vit D (OSTEO BI-FLEX ONE PER DAY PO) Take by mouth daily.   . butalbital-acetaminophen-caffeine (FIORICET) 50-325-40 MG tablet Take 1 tablet by mouth 2 (two) times daily as needed for headache or migraine.  . carvedilol (COREG) 3.125 MG tablet TAKE 1 TABLET TWICE A DAY  WITH MEALS  . famotidine (PEPCID) 20 MG tablet Take 20 mg by mouth daily. Alternates with omeprazole  . glimepiride (AMARYL) 1 MG tablet Take 1 tablet (1 mg total) by mouth daily with breakfast.  . hydrochlorothiazide (HYDRODIURIL) 25 MG tablet TAKE 1 TABLET DAILY  . INS SYRINGE/NEEDLE 1CC/28G (B-D INS SYR MICROFINE 1CC/28G) 28G X 1/2" 1 ML MISC USE AS DIRECTED  . insulin NPH Human (NOVOLIN N) 100 UNIT/ML injection Inject 0.4 mLs (40 Units total) into the skin at bedtime.  Marland Kitchen levothyroxine (SYNTHROID) 100 MCG tablet TAKE 1 TABLET DAILY, AND ON2 DAYS A WEEK, TAKE AN     EXTRA 1/2 TABLET  . lisinopril (ZESTRIL) 5 MG tablet TAKE 1 TABLET DAILY  . loratadine (CLARITIN) 10 MG tablet Take 1 tablet (10 mg total) by mouth daily.  . metFORMIN (GLUCOPHAGE) 500 MG tablet TAKE 2 TABLETS 2 TIMES     DAILY WITH MEALS  . MISC NATURAL PRODUCTS PO Take by mouth daily. Amberen Menopause Relief  . Multiple Vitamin (MULTIVITAMIN) tablet Take  1 tablet by mouth daily.  Marland Kitchen nystatin cream (MYCOSTATIN) APPLY TO AFFECTED AREA TWICE A DAY  . omeprazole (PRILOSEC) 20 MG capsule TAKE 1 DAILY-ALTERNATES WITH FAMOTIDINE  . ONE TOUCH LANCETS MISC Use to check sugar twice daily Dx: E11.8  . ONETOUCH ULTRA test strip USE TO CHECK BLOOD SUGAR 2 TIMES   DAILY  . pioglitazone (ACTOS) 15 MG tablet TAKE 1 TABLET DAILY  . simvastatin (ZOCOR) 40 MG tablet TAKE 1 TABLET AT BEDTIME  . topiramate (TOPAMAX) 25 MG tablet Take 1 tablet (25 mg total) by mouth at bedtime.  Marland Kitchen umeclidinium-vilanterol (ANORO ELLIPTA) 62.5-25 MCG/INH AEPB Inhale 1 puff into the lungs  daily.  . ziprasidone (GEODON) 40 MG capsule TAKE 1 CAPSULE TWICE DAILY WITH MEALS   Immunization History  Administered Date(s) Administered  . Fluad Quad(high Dose 65+) 04/24/2019, 05/26/2020  . Influenza Split 05/09/2011, 06/11/2012  . Influenza Whole 05/20/2010  . Influenza,inj,Quad PF,6+ Mos 07/14/2013, 06/16/2014, 04/30/2015, 05/31/2016, 05/10/2017, 04/19/2018  . Moderna SARS-COVID-2 Vaccination 10/18/2019, 11/15/2019  . Pneumococcal Conjugate-13 11/14/2017, 04/19/2018  . Pneumococcal Polysaccharide-23 04/18/2011  . Td 04/18/2011  . Zoster 12/29/2013  . Zoster Recombinat (Shingrix) 11/14/2017, 01/02/2018       Objective:   Physical Exam BP 134/78 (BP Location: Left Arm, Cuff Size: Normal)   Pulse 85   Temp 97.8 F (36.6 C) (Temporal)   Ht 5\' 2"  (1.575 m)   Wt 237 lb (107.5 kg)   SpO2 97%   BMI 43.35 kg/m  GENERAL: Obese woman, presents in transport chair.  Wearing sunglasses due to migraine. HEAD: Normocephalic, atraumatic.  EYES: Deferred due to migraine, wearing sunglasses. MOUTH: Nose/mouth/throat not examined due to masking requirements for COVID 19. NECK: Supple. No thyromegaly. Trachea midline. No JVD.  No adenopathy.  Prominent dorsocervical fat pad posteriorly. PULMONARY: Good air entry bilaterally.  No adventitious sounds. CARDIOVASCULAR: S1 and S2. Regular rate and rhythm.  No rubs, murmurs or gallops heard. ABDOMEN: Obese, nondistended. MUSCULOSKELETAL: No joint deformity, no clubbing, no edema.  NEUROLOGIC: No overt focal deficit.  Speech is fluent. SKIN: Intact,warm,dry. PSYCH: Depressed mood.  Normal behavior.   We reviewed the patient's lung cancer screening, only significant for centrilobular emphysema very small pulmonary nodules all benign in appearance.  Follow-up film in a years time.  Patient has had evaluation on 5 October with barium swallow that showed motility with retention of barium in the high cervical thoracic esophagus.  She had swallow  evaluation on 15 October ordered by Dr. Beverly Gust that was normal.    Assessment & Plan:     ICD-10-CM   1. Stage 1 mild COPD by GOLD classification ([place])  J44.9 Pulse oximetry, overnight   Patient has very mild COPD Dyspnea out of proportion to this very mild defect Continue Anoro and as needed albuterol  2. Shortness of breath  R06.02 ECHOCARDIOGRAM COMPLETE    Ambulatory referral to Cardiology   Out of proportion to very mild changes on PFT Obtain 2D echo to evaluate for potential DD and PAH Overnight oximetry Referral to cardiology  3. Diastolic dysfunction  Z60.10 Ambulatory referral to Cardiology   Being reevaluated with 2D echo Referral to cardiology to evaluate for cardiac causes of dyspnea  4. Morbid obesity ([place])  E66.01    This issue adds complexity to her management May be aggravating her sensation of dyspnea Continues to engage in weight loss   Orders Placed This Encounter  Procedures  . Ambulatory referral to Cardiology    Referral Priority:  Routine    Referral Type:   Consultation    Referral Reason:   Specialty Services Required    Requested Specialty:   Cardiology    Number of Visits Requested:   1  . Pulse oximetry, overnight    On roomair.  ZYS:[id number]    Standing Status:   Future    Standing Expiration Date:   12/06/2020  . ECHOCARDIOGRAM COMPLETE    Standing Status:   Future    Standing Expiration Date:   12/06/2020    Order Specific Question:   Where should this test be performed    Answer:   [facility]    Order Specific Question:   Please indicate who you request to read the echo results.    Answer:   [facility]    Order Specific Question:   Perflutren DEFINITY (image enhancing agent) should be administered unless hypersensitivity or allergy exist    Answer:   Administer Perflutren    Order Specific Question:   Reason for exam-Echo    Answer:   Dyspnea  786.09 / R06.00   Meds ordered this encounter  Medications  . albuterol  (VENTOLIN HFA) 108 (90 Base) MCG/ACT inhaler    Sig: Inhale 2 puffs into the lungs every 6 (six) hours as needed for wheezing or shortness of breath.    Dispense:  8 g    Refill:  3   Discussion:  Patient continues to have issues with significant dyspnea.  However she shows only very mild obstructive defect on PFTs with normal diffusion capacity.  Dyspnea is out of proportion to her very mild changes.  She has been instructed to continue Anoro Ellipta as well as as needed albuterol.  Her dyspnea may be related to diastolic dysfunction, potential pulmonary hypertension, potential coronary artery disease.  She has been referred to cardiology.  We will also obtain 2D echo.  Other elements impacting on her dyspnea are obesity and deconditioning.  She states that she has lost in excess of 60 pounds.  This has not made an significant impact on her dyspnea.  Open 3 months time she is to call us sooner should any new difficulties arise.   Renold Don, MD [facility]   *This note was dictated using voice recognition software/Dragon.  Despite best efforts to proofread, errors can occur which can change the meaning.  Any change was purely unintentional.

## 2020-06-07 NOTE — Telephone Encounter (Signed)
  Follow up Call-  Call back number 06/03/2020  Post procedure Call Back phone  # [phone]  Permission to leave phone message Yes  Some recent data might be hidden     Patient questions:  Do you have a fever, pain , or abdominal swelling? No. Pain Score  0 *  Have you tolerated food without any problems? Yes.    Have you been able to return to your normal activities? Yes.    Do you have any questions about your discharge instructions: Diet   No. Medications  No. Follow up visit  No.  Do you have questions or concerns about your Care? No.  Actions: * If pain score is 4 or above: No action needed, pain <4.  1. Have you developed a fever since your procedure? no  2.   Have you had an respiratory symptoms (SOB or cough) since your procedure? no  3.   Have you tested positive for COVID 19 since your procedure no  4.   Have you had any family members/close contacts diagnosed with the COVID 19 since your procedure?  no   If yes to any of these questions please route to Joylene John, RN and Joella Prince, RN

## 2020-06-07 NOTE — Patient Instructions (Signed)
We are going to order an overnight oxygen test  We are going to check an echocardiogram and refer you to the cardiologist to evaluate your heart  Breathing test did not show any major obstruction, symptoms are out of proportion to what showed on the breathing test this is why I want your heart checked  We will see you in follow-up in 3 months time call sooner should any new difficulties arise  We are going to send the prescription for your rescue inhaler

## 2020-06-09 ENCOUNTER — Ambulatory Visit (INDEPENDENT_AMBULATORY_CARE_PROVIDER_SITE_OTHER): Payer: Medicare HMO

## 2020-06-09 ENCOUNTER — Other Ambulatory Visit: Payer: Self-pay

## 2020-06-09 DIAGNOSIS — R0602 Shortness of breath: Secondary | ICD-10-CM | POA: Diagnosis not present

## 2020-06-09 LAB — ECHOCARDIOGRAM COMPLETE
AR max vel: 2.22 cm2
AV Area VTI: 2.3 cm2
AV Area mean vel: 2.2 cm2
AV Mean grad: 5 mmHg
AV Peak grad: 9.2 mmHg
Ao pk vel: 1.52 m/s
Area-P 1/2: 3.91 cm2
S' Lateral: 3.7 cm

## 2020-06-11 ENCOUNTER — Encounter: Payer: Self-pay | Admitting: Cardiology

## 2020-06-11 ENCOUNTER — Ambulatory Visit: Payer: Medicare HMO | Admitting: Cardiology

## 2020-06-11 ENCOUNTER — Other Ambulatory Visit: Payer: Self-pay

## 2020-06-11 VITALS — BP 120/68 | HR 91 | Ht 62.0 in | Wt 243.0 lb

## 2020-06-11 DIAGNOSIS — R0600 Dyspnea, unspecified: Secondary | ICD-10-CM | POA: Diagnosis not present

## 2020-06-11 DIAGNOSIS — E7800 Pure hypercholesterolemia, unspecified: Secondary | ICD-10-CM | POA: Diagnosis not present

## 2020-06-11 DIAGNOSIS — I10 Essential (primary) hypertension: Secondary | ICD-10-CM | POA: Diagnosis not present

## 2020-06-11 DIAGNOSIS — R0609 Other forms of dyspnea: Secondary | ICD-10-CM

## 2020-06-11 NOTE — Patient Instructions (Signed)
Medication Instructions:  Your physician recommends that you continue on your current medications as directed. Please refer to the Current Medication list given to you today.  *If you need a refill on your cardiac medications before your next appointment, please call your pharmacy*   Lab Work: None ordered If you have labs (blood work) drawn today and your tests are completely normal, you will receive your results only by: Marland Kitchen MyChart Message (if you have MyChart) OR . A paper copy in the mail If you have any lab test that is abnormal or we need to change your treatment, we will call you to review the results.   Testing/Procedures: Your physician has requested that you have an limited echocardiogram. Echocardiography is a painless test that uses sound waves to create images of your heart. It provides your doctor with information about the size and shape of your heart and how well your heart's chambers and valves are working. This procedure takes approximately one hour. There are no restrictions for this procedure.     Follow-Up: At [facility], you and your health needs are our priority.  As part of our continuing mission to provide you with exceptional heart care, we have created designated Provider Care Teams.  These Care Teams include your primary Cardiologist (physician) and Advanced Practice Providers (APPs -  Physician Assistants and Nurse Practitioners) who all work together to provide you with the care you need, when you need it.  We recommend signing up for the patient portal called "MyChart".  Sign up information is provided on this After Visit Summary.  MyChart is used to connect with patients for Virtual Visits (Telemedicine).  Patients are able to view lab/test results, encounter notes, upcoming appointments, etc.  Non-urgent messages can be sent to your provider as well.   To learn more about what you can do with MyChart, go to [email or web address].    Your next  appointment:   TBD after testing   The format for your next appointment:   In Person  Provider:   You may see Dr. Garen Lah or one of the following Advanced Practice Providers on your designated Care Team:    Murray Hodgkins, NP  Christell Faith, PA-C  Marrianne Mood, PA-C  Cadence Kathlen Mody, [place]    Other Instructions N/A

## 2020-06-11 NOTE — Progress Notes (Signed)
Cardiology Office Note:    Date:  06/11/2020   ID:  [name], DOB Dec 09, 1950, MRN [id number]  PCP:  Ria Bush, MD  [facility] Cardiologist:  No primary care provider on file.  [facility] Electrophysiologist:  None   Referring MD: Tyler Pita, MD   Chief Complaint  Patient presents with  . New Patient (Initial Visit)    Referred by Dr. Patsey Berthold for SOB and Grade 1 DD  Pt c/o of SOB on minimal exertion accompanied by chest pain/pressure---states is occasionally out of breth when she wakes up in the morning.    History of Present Illness:    [name] is a 69 y.o. female with a hx of hypertension, hyperlipidemia, diabetes, former smoker x40 years, COPD, OSA who presents due to shortness of breath and abnormal echo. Patient with history of COPD and shortness of breath usually with exertion for couple of years. Follows up with pulmonary medicine. An echocardiogram was obtained due to shortness of breath. Images were technically difficult, could not evaluate endocardial borders. EF probably mild to moderately reduced, 40%.  She denies chest pain.  Previously use a CPAP for OSA but stopped using this due to severe gagging.  Recently had an upper endoscopy with esophageal dilatation, resolving symptoms of gagging for the past 1 to 2 weeks.  She has lost about 60 pounds over the past year.  Is working on losing more weight.  States having stress testing and left heart cath years ago which was normal.  Denies chest pain at rest or with exertion.  She has sleep apnea in the past.  Repeat sleep study being planned by pulmonary medicine.  Past Medical History:  Diagnosis Date  . Anxiety   . Bipolar disorder ([place])    has stopped seeing psychiatrist  . Cataract   . Chronic bronchitis   . COPD (chronic obstructive pulmonary disease) ([place])   . Coronary artery disease, non-occlusive   . Depression   . Dyspnea 2012   s/p pulm/cards w/u WNL, thought due to  obesity/deconditioning  . Fibromyalgia   . GERD (gastroesophageal reflux disease)   . History of chicken pox   . HLD (hyperlipidemia)   . Hypertension   . Hypothyroidism   . Migraine   . OSA (obstructive sleep apnea) 03/16/2011  . Sleep apnea   . T2DM (type 2 diabetes mellitus) ([place]) 2012   DM education 06/2011  . Urinary incontinence     Past Surgical History:  Procedure Laterality Date  . BREAST CYST ASPIRATION Right 2003  . cardiac catherization  03/2011   x3 [facility]), mild nonobstructive CAD  . COLONOSCOPY  12/2011   hyperplastic polyp x2,. diverticulosis, rec rpt 10 yrs  . ESOPHAGOGASTRODUODENOSCOPY  2006  . KNEE SURGERY  2006   left, torn meniscus  . TOTAL ABDOMINAL HYSTERECTOMY  2004   fibroids, cervix remained  . TOTAL KNEE ARTHROPLASTY Left 08/2012   [name], [facility]    Current Medications: Current Meds  Medication Sig  . Acetaminophen (TYLENOL ARTHRITIS PAIN PO) Take by mouth in the morning and at bedtime.  Marland Kitchen albuterol (VENTOLIN HFA) 108 (90 Base) MCG/ACT inhaler Inhale 2 puffs into the lungs every 6 (six) hours as needed for wheezing or shortness of breath.  . APPLE CIDER VINEGAR PO Take by mouth.  Marland Kitchen aspirin EC 81 MG tablet Take 81 mg by mouth daily.   . [place]-Glucosamine-Vit D (OSTEO BI-FLEX ONE PER DAY PO) Take by mouth daily.   . butalbital-acetaminophen-caffeine (  FIORICET) 50-325-40 MG tablet Take 1 tablet by mouth 2 (two) times daily as needed for headache or migraine.  . carvedilol (COREG) 3.125 MG tablet TAKE 1 TABLET TWICE A DAY  WITH MEALS  . famotidine (PEPCID) 20 MG tablet Take 20 mg by mouth daily. Alternates with omeprazole  . glimepiride (AMARYL) 1 MG tablet Take 1 tablet (1 mg total) by mouth daily with breakfast.  . hydrochlorothiazide (HYDRODIURIL) 25 MG tablet TAKE 1 TABLET DAILY  . INS SYRINGE/NEEDLE 1CC/28G (B-D INS SYR MICROFINE 1CC/28G) 28G X 1/2" 1 ML MISC USE AS DIRECTED  . insulin NPH Human (NOVOLIN N) 100 UNIT/ML injection  Inject 0.4 mLs (40 Units total) into the skin at bedtime.  Marland Kitchen levothyroxine (SYNTHROID) 100 MCG tablet TAKE 1 TABLET DAILY, AND ON2 DAYS A WEEK, TAKE AN     EXTRA 1/2 TABLET  . lisinopril (ZESTRIL) 5 MG tablet TAKE 1 TABLET DAILY  . loratadine (CLARITIN) 10 MG tablet Take 1 tablet (10 mg total) by mouth daily.  . metFORMIN (GLUCOPHAGE) 500 MG tablet TAKE 2 TABLETS 2 TIMES     DAILY WITH MEALS  . MISC NATURAL PRODUCTS PO Take by mouth daily. Amberen Menopause Relief  . Multiple Vitamin (MULTIVITAMIN) tablet Take 1 tablet by mouth daily.  . Naproxen Sodium 220 MG CAPS Take 440 mg by mouth 2 (two) times daily.   Marland Kitchen nystatin cream (MYCOSTATIN) APPLY TO AFFECTED AREA TWICE A DAY  . omeprazole (PRILOSEC) 20 MG capsule TAKE 1 DAILY-ALTERNATES WITH FAMOTIDINE  . ONE TOUCH LANCETS MISC Use to check sugar twice daily Dx: E11.8  . ONETOUCH ULTRA test strip USE TO CHECK BLOOD SUGAR 2 TIMES   DAILY  . pioglitazone (ACTOS) 15 MG tablet TAKE 1 TABLET DAILY  . simvastatin (ZOCOR) 40 MG tablet TAKE 1 TABLET AT BEDTIME  . topiramate (TOPAMAX) 50 MG tablet Take 1 tablet (50 mg total) by mouth at bedtime.  Marland Kitchen umeclidinium-vilanterol (ANORO ELLIPTA) 62.5-25 MCG/INH AEPB Inhale 1 puff into the lungs daily.  . ziprasidone (GEODON) 40 MG capsule TAKE 1 CAPSULE TWICE DAILY WITH MEALS     Allergies:   Crestor [rosuvastatin], Lipitor [atorvastatin], Lithium, and Quetiapine   Social History   Socioeconomic History  . Marital status: Married    Spouse name: Not on file  . Number of children: Not on file  . Years of education: Not on file  . Highest education level: Not on file  Occupational History  . Occupation: disabled    Employer: OTHER  Tobacco Use  . Smoking status: Former Smoker    Packs/day: 2.00    Years: 43.00    Pack years: 86.00    Types: Cigarettes    Quit date: 05/17/2010    Years since quitting: 10.0  . Smokeless tobacco: Never Used  Vaping Use  . Vaping Use: Never used  Substance and Sexual  Activity  . Alcohol use: No    Alcohol/week: 0.0 standard drinks  . Drug use: No  . Sexual activity: Yes    Partners: Male  Other Topics Concern  . Not on file  Social History Narrative   Caffeine: 2-3 coffee/am, 1 cup tea in afternoon; Lives with husband, 1 dog, 2 cats; Occupation: disability since 2009 for bipolar, previously worked at [employer]; Diet: not many fruits, good vegetables, good amt water.      Smoker 43 years; quit in 2011. No alcohol. Lives in [place]; with husband.    Social Determinants of Health   Financial Resource Strain:   .  Difficulty of Paying Living Expenses: Not on file  Food Insecurity:   . Worried About Charity fundraiser in the Last Year: Not on file  . Ran Out of Food in the Last Year: Not on file  Transportation Needs:   . Lack of Transportation (Medical): Not on file  . Lack of Transportation (Non-Medical): Not on file  Physical Activity:   . Days of Exercise per Week: Not on file  . Minutes of Exercise per Session: Not on file  Stress:   . Feeling of Stress : Not on file  Social Connections:   . Frequency of Communication with Friends and Family: Not on file  . Frequency of Social Gatherings with Friends and Family: Not on file  . Attends Religious Services: Not on file  . Active Member of Clubs or Organizations: Not on file  . Attends Archivist Meetings: Not on file  . Marital Status: Not on file     Family History: The patient's family history includes Acute lymphoblastic leukemia (age of onset: 42) in her cousin; Aneurysm in her maternal uncle; Arthritis in her mother; Asthma in her maternal uncle; Breast cancer in her maternal aunt; COPD in her sister; Cancer in her maternal uncle and paternal uncle; Cancer (age of onset: 107) in her father; Cancer (age of onset: 8) in her maternal aunt; Colon cancer (age of onset: 81) in her maternal uncle; Diabetes in her father; Hypertension in her mother; Stroke in her mother; Thyroid disease in  her father. There is no history of Coronary artery disease, Stomach cancer, Rectal cancer, or Esophageal cancer.  ROS:   Please see the history of present illness.     All other systems reviewed and are negative.  EKGs/Labs/Other Studies Reviewed:    The following studies were reviewed today:   EKG:  EKG is  ordered today.  The ekg ordered today demonstrates sinus rhythm  Recent Labs: 10/03/2019: Pro B Natriuretic peptide (BNP) 29.0 05/21/2020: ALT 17; BUN 13; Creatinine, Ser 0.78; Hemoglobin 13.2; Platelets 366; Potassium 4.4; Sodium 138; TSH 2.79  Recent Lipid Panel    Component Value Date/Time   CHOL 131 05/21/2020 0915   TRIG 239.0 (H) 05/21/2020 0915   HDL 42.80 05/21/2020 0915   CHOLHDL 3 05/21/2020 0915   VLDL 47.8 (H) 05/21/2020 0915   LDLCALC 48 12/08/2013 0824   LDLDIRECT 65.0 05/21/2020 0915     Risk Assessment/Calculations:      Physical Exam:    VS:  BP 120/68   Pulse 91   Ht 5\' 2"  (1.575 m)   Wt 243 lb (110.2 kg)   BMI 44.45 kg/m     Wt Readings from Last 3 Encounters:  06/11/20 243 lb (110.2 kg)  06/07/20 237 lb (107.5 kg)  06/03/20 248 lb (112.5 kg)     GEN:  Well nourished, well developed in no acute distress HEENT: Normal NECK: No JVD; No carotid bruits LYMPHATICS: No lymphadenopathy CARDIAC: RRR, no murmurs, rubs, gallops RESPIRATORY:  Clear to auscultation, decreased breath sounds at bases ABDOMEN: Soft, non-tender, non-distended MUSCULOSKELETAL:  No edema; No deformity  SKIN: Warm and dry NEUROLOGIC:  Alert and oriented x 3 PSYCHIATRIC:  Normal affect   ASSESSMENT:    1. Dyspnea on exertion   2. Primary hypertension   3. Pure hypercholesterolemia   4. Obesity, morbid, BMI 40.0-49.9 ([place])    PLAN:    In order of problems listed above:  1. Patient with dyspnea on exertion.  Recent echo, technically  difficult study, endocardial borders not clearly defined.  Probably moderately reduced EF, 40%.  We will get a repeat limited echo  with contrast to evaluate EF, wall motion abnormalities.  If EF is normal on repeat echo with contrast, plan for Lexiscan.  If EF is reduced, we will plan for left and right heart cath.  Etiology for shortness of breath likely multifactorial including obesity/deconditioning, COPD and possibly cardiac pending echo results.  She denies chest pain at rest or with exertion. 2. History of hypertension, BP controlled.  Continue lisinopril Coreg. 3. History of hyperlipidemia, continue statin. 4. Patient is morbidly obese, has lost 60 pounds over the past year.  She was congratulated and encouraged on continuous low calorie diet and weight loss.  Follow-up after limited echo.  We will contact patient with echo results as to next steps.   Medication Adjustments/Labs and Tests Ordered: Current medicines are reviewed at length with the patient today.  Concerns regarding medicines are outlined above.  Orders Placed This Encounter  Procedures  . ECHOCARDIOGRAM LIMITED   No orders of the defined types were placed in this encounter.   Patient Instructions  Medication Instructions:  Your physician recommends that you continue on your current medications as directed. Please refer to the Current Medication list given to you today.  *If you need a refill on your cardiac medications before your next appointment, please call your pharmacy*   Lab Work: None ordered If you have labs (blood work) drawn today and your tests are completely normal, you will receive your results only by: Marland Kitchen MyChart Message (if you have MyChart) OR . A paper copy in the mail If you have any lab test that is abnormal or we need to change your treatment, we will call you to review the results.   Testing/Procedures: Your physician has requested that you have an limited echocardiogram. Echocardiography is a painless test that uses sound waves to create images of your heart. It provides your doctor with information about the size and  shape of your heart and how well your heart's chambers and valves are working. This procedure takes approximately one hour. There are no restrictions for this procedure.     Follow-Up: At [facility], you and your health needs are our priority.  As part of our continuing mission to provide you with exceptional heart care, we have created designated Provider Care Teams.  These Care Teams include your primary Cardiologist (physician) and Advanced Practice Providers (APPs -  Physician Assistants and Nurse Practitioners) who all work together to provide you with the care you need, when you need it.  We recommend signing up for the patient portal called "MyChart".  Sign up information is provided on this After Visit Summary.  MyChart is used to connect with patients for Virtual Visits (Telemedicine).  Patients are able to view lab/test results, encounter notes, upcoming appointments, etc.  Non-urgent messages can be sent to your provider as well.   To learn more about what you can do with MyChart, go to [email or web address].    Your next appointment:   TBD after testing   The format for your next appointment:   In Person  Provider:   You may see Dr. Garen Lah or one of the following Advanced Practice Providers on your designated Care Team:    Murray Hodgkins, NP  Christell Faith, PA-C  Marrianne Mood, PA-C  Cadence Emlenton, [place]    Other Instructions N/A     Signed, Kate Sable, MD  06/11/2020 5:06  PM    [facility]

## 2020-06-12 NOTE — Progress Notes (Signed)
Thank you for the input.  CLG

## 2020-06-15 ENCOUNTER — Ambulatory Visit (INDEPENDENT_AMBULATORY_CARE_PROVIDER_SITE_OTHER): Payer: Medicare HMO

## 2020-06-15 ENCOUNTER — Encounter: Payer: Self-pay | Admitting: Family Medicine

## 2020-06-15 ENCOUNTER — Other Ambulatory Visit: Payer: Self-pay

## 2020-06-15 DIAGNOSIS — R0609 Other forms of dyspnea: Secondary | ICD-10-CM

## 2020-06-15 DIAGNOSIS — R0600 Dyspnea, unspecified: Secondary | ICD-10-CM | POA: Diagnosis not present

## 2020-06-16 ENCOUNTER — Encounter: Payer: Self-pay | Admitting: Pulmonary Disease

## 2020-06-16 ENCOUNTER — Encounter: Payer: Self-pay | Admitting: Gastroenterology

## 2020-06-17 ENCOUNTER — Telehealth: Payer: Self-pay

## 2020-06-17 ENCOUNTER — Encounter: Payer: Self-pay | Admitting: Family Medicine

## 2020-06-17 DIAGNOSIS — R0609 Other forms of dyspnea: Secondary | ICD-10-CM

## 2020-06-17 DIAGNOSIS — R0600 Dyspnea, unspecified: Secondary | ICD-10-CM

## 2020-06-17 NOTE — Telephone Encounter (Signed)
Called and spoke to patient regarding Dr. Thereasa Solo following echo result note:  Agbor-Etang, Aaron Edelman, MD  Kavin Leech, RN Technically difficult images, EF probably normal. Please schedule patient for [place], with diagnosis of diagnosis dyspnea on exertion. Follow-up with myself after Lexiscan.  Reviewed Instructions with patient and sent a copy through [place].  Will route to scheduling.

## 2020-06-17 NOTE — Telephone Encounter (Signed)
Noted  

## 2020-06-17 NOTE — Telephone Encounter (Signed)
-----   Message from Kate Sable, MD sent at 06/16/2020  5:48 PM EDT ----- Technically difficult images, EF probably normal.  Please schedule patient for Lexiscan, with diagnosis of diagnosis dyspnea on exertion.  Follow-up with myself after Lexiscan.  Thank you

## 2020-06-18 DIAGNOSIS — J449 Chronic obstructive pulmonary disease, unspecified: Secondary | ICD-10-CM | POA: Diagnosis not present

## 2020-06-21 ENCOUNTER — Ambulatory Visit: Payer: Medicare HMO | Attending: Internal Medicine

## 2020-06-21 DIAGNOSIS — Z23 Encounter for immunization: Secondary | ICD-10-CM

## 2020-06-21 NOTE — Progress Notes (Signed)
° °  Covid-19 Vaccination Clinic  Name:  [name]    MRN: [id number] DOB: [birth year]  06/21/2020  [name] was observed post Covid-19 immunization for 15 minutes without incident. She was provided with Vaccine Information Sheet and instruction to access the V-Safe system.   [name] was instructed to call 911 with any severe reactions post vaccine:  Difficulty breathing   Swelling of face and throat   A fast heartbeat   A bad rash all over body   Dizziness and weakness

## 2020-06-21 NOTE — Telephone Encounter (Signed)
Patient calling  States she had a booster and had to reschedule her mammogram Wanted to make sure it would not cause any complications for her stress test Please call to discuss

## 2020-06-22 ENCOUNTER — Encounter: Payer: Self-pay | Admitting: Family Medicine

## 2020-06-22 NOTE — Addendum Note (Signed)
Addended by: Therisa Doyne on: 06/22/2020 04:25 PM   Modules accepted: Orders

## 2020-06-22 NOTE — Telephone Encounter (Signed)
Topamax Last rx:  06/17/20 Last OV:  05/26/20, AWV Next OV:  09/27/20, 4 mo f/u

## 2020-06-24 ENCOUNTER — Other Ambulatory Visit (HOSPITAL_COMMUNITY): Payer: Medicare HMO

## 2020-06-24 MED ORDER — TOPIRAMATE 50 MG PO TABS
100.0000 mg | ORAL_TABLET | Freq: Every evening | ORAL | 1 refills | Status: DC
Start: 2020-06-24 — End: 2020-07-01

## 2020-06-24 NOTE — Telephone Encounter (Signed)
Kate Sable, MD     Faythe Ghee for [place]. Thank you    Patient made aware of Dr. Thereasa Solo response. Patient verbalized understanding and voiced appreciation for the call.

## 2020-06-24 NOTE — Telephone Encounter (Signed)
Ok to do - I've sent in 100mg  dose

## 2020-06-25 ENCOUNTER — Encounter: Payer: Self-pay | Admitting: Family Medicine

## 2020-06-28 ENCOUNTER — Ambulatory Visit (HOSPITAL_COMMUNITY): Admit: 2020-06-28 | Payer: Medicare HMO | Admitting: Gastroenterology

## 2020-06-28 ENCOUNTER — Encounter (HOSPITAL_COMMUNITY): Payer: Self-pay

## 2020-06-28 SURGERY — ESOPHAGOGASTRODUODENOSCOPY (EGD) WITH PROPOFOL
Anesthesia: Monitor Anesthesia Care

## 2020-06-29 ENCOUNTER — Other Ambulatory Visit: Payer: Self-pay

## 2020-06-29 ENCOUNTER — Encounter
Admission: RE | Admit: 2020-06-29 | Discharge: 2020-06-29 | Disposition: A | Discharge status: Home / Self Care | Payer: Medicare HMO | Source: Ambulatory Visit | Attending: Cardiology | Admitting: Cardiology

## 2020-06-29 DIAGNOSIS — R0600 Dyspnea, unspecified: Secondary | ICD-10-CM | POA: Insufficient documentation

## 2020-06-29 DIAGNOSIS — R0609 Other forms of dyspnea: Secondary | ICD-10-CM

## 2020-06-29 MED ORDER — TECHNETIUM TC 99M TETROFOSMIN IV KIT
10.0000 | PACK | Freq: Once | INTRAVENOUS | Status: AC | PRN
  Administered 2020-06-29: 10.25 via INTRAVENOUS

## 2020-06-29 MED ORDER — REGADENOSON 0.4 MG/5ML IV SOLN
0.4000 mg | Freq: Once | INTRAVENOUS | Status: AC
  Administered 2020-06-29: 0.4 mg via INTRAVENOUS

## 2020-06-29 MED ORDER — TECHNETIUM TC 99M TETROFOSMIN IV KIT
30.8810 | PACK | Freq: Once | INTRAVENOUS | Status: AC | PRN
  Administered 2020-06-29: 30.881 via INTRAVENOUS

## 2020-06-30 LAB — NM MYOCAR MULTI W/SPECT W/WALL MOTION / EF
LV dias vol: 104 mL (ref 46–106)
LV sys vol: 44 mL
Peak HR: 103 {beats}/min
Percent HR: 67 %
Rest HR: 82 {beats}/min
TID: 0.89

## 2020-07-01 MED ORDER — TOPIRAMATE 100 MG PO TABS
100.0000 mg | ORAL_TABLET | Freq: Every evening | ORAL | 1 refills | Status: DC
Start: 2020-07-01 — End: 2020-11-25

## 2020-07-01 NOTE — Telephone Encounter (Signed)
For future reference, please go ahead and fix something like this - doesn't need to go back to me first - as I sent in 100mg  tablets on 06/22/2020 (see other mychart message).

## 2020-07-02 ENCOUNTER — Ambulatory Visit: Payer: Medicare HMO | Admitting: Cardiology

## 2020-07-02 ENCOUNTER — Other Ambulatory Visit: Payer: Self-pay

## 2020-07-02 ENCOUNTER — Other Ambulatory Visit: Payer: Self-pay | Admitting: Family Medicine

## 2020-07-02 ENCOUNTER — Encounter: Payer: Self-pay | Admitting: Cardiology

## 2020-07-02 ENCOUNTER — Encounter: Payer: Self-pay | Admitting: Family Medicine

## 2020-07-02 VITALS — BP 110/50 | HR 86 | Ht 62.0 in | Wt 247.4 lb

## 2020-07-02 DIAGNOSIS — E7800 Pure hypercholesterolemia, unspecified: Secondary | ICD-10-CM

## 2020-07-02 DIAGNOSIS — I10 Essential (primary) hypertension: Secondary | ICD-10-CM | POA: Diagnosis not present

## 2020-07-02 DIAGNOSIS — R0600 Dyspnea, unspecified: Secondary | ICD-10-CM

## 2020-07-02 DIAGNOSIS — R0609 Other forms of dyspnea: Secondary | ICD-10-CM

## 2020-07-02 NOTE — Progress Notes (Signed)
Cardiology Office Note:    Date:  07/02/2020   ID:  [name], DOB [birth year], MRN [id number]  PCP:  Ria Bush, MD  [facility] Cardiologist:  No primary care provider on file.  [facility] Electrophysiologist:  None   Referring MD: Ria Bush, MD   Chief Complaint  Patient presents with  . other    Follow up Myoview and Echo. Meds reviewed by the pt. verbally. Pt. c/o chest pain and shortness of breath.     History of Present Illness:    [name] is a 69 y.o. female with a hx of hypertension, hyperlipidemia, diabetes, former smoker x40 years, COPD, OSA who presents for follow-up.  Patient last seen due to shortness of breath and abnormal echo. Echocardiogram showed difficult to obtain ABGs, EF probably 40%.  Lexiscan Myoview was performed to evaluate presence of ischemia due to persistent dyspnea on exertion.  Patient still has dyspnea on exertion, endorses chest discomfort over the past 3 days.  Her brother has a history of CAD with 5 stents in his 45s.  Prior notes  Patient with history of COPD and shortness of breath usually with exertion for couple of years. Follows up with pulmonary medicine. An echocardiogram was obtained due to shortness of breath. Images were technically difficult, could not evaluate endocardial borders. EF probably mild to moderately reduced, 40%.  She denies chest pain.  Previously use a CPAP for OSA but stopped using this due to severe gagging.  Recently had an upper endoscopy with esophageal dilatation, resolving symptoms of gagging for the past 1 to 2 weeks.  She has lost about 60 pounds over the past year.  Is working on losing more weight.  States having stress testing and left heart cath years ago which was normal.    Past Medical History:  Diagnosis Date  . Anxiety   . Bipolar disorder ([place])    has stopped seeing psychiatrist  . Cataract   . Chronic bronchitis   . COPD (chronic obstructive pulmonary disease) ([place])    . Coronary artery disease, non-occlusive   . Depression   . Dyspnea 2012   s/p pulm/cards w/u WNL, thought due to obesity/deconditioning  . Fibromyalgia   . GERD (gastroesophageal reflux disease)   . History of chicken pox   . HLD (hyperlipidemia)   . Hypertension   . Hypothyroidism   . Migraine   . OSA (obstructive sleep apnea) 03/16/2011  . Sleep apnea   . T2DM (type 2 diabetes mellitus) ([place]) 2012   DM education 06/2011  . Urinary incontinence     Past Surgical History:  Procedure Laterality Date  . BREAST CYST ASPIRATION Right 2003  . cardiac catherization  03/2011   x3 [facility]), mild nonobstructive CAD  . COLONOSCOPY  12/2011   hyperplastic polyp x2,. diverticulosis, rec rpt 10 yrs  . ESOPHAGOGASTRODUODENOSCOPY  2006  . KNEE SURGERY  2006   left, torn meniscus  . TOTAL ABDOMINAL HYSTERECTOMY  2004   fibroids, cervix remained  . TOTAL KNEE ARTHROPLASTY Left 08/2012   [name], [facility]    Current Medications: Current Meds  Medication Sig  . Acetaminophen (TYLENOL ARTHRITIS PAIN PO) Take by mouth in the morning and at bedtime.  Marland Kitchen albuterol (VENTOLIN HFA) 108 (90 Base) MCG/ACT inhaler Inhale 2 puffs into the lungs every 6 (six) hours as needed for wheezing or shortness of breath.  . APPLE CIDER VINEGAR PO Take by mouth.  Marland Kitchen aspirin EC 81 MG tablet Take 81 mg  by mouth daily.   . [place]-Glucosamine-Vit D (OSTEO BI-FLEX ONE PER DAY PO) Take by mouth daily.   . butalbital-acetaminophen-caffeine (FIORICET) 50-325-40 MG tablet Take 1 tablet by mouth 2 (two) times daily as needed for headache or migraine.  . carvedilol (COREG) 3.125 MG tablet TAKE 1 TABLET TWICE A DAY  WITH MEALS  . famotidine (PEPCID) 20 MG tablet Take 20 mg by mouth daily. Alternates with omeprazole  . glimepiride (AMARYL) 1 MG tablet Take 1 tablet (1 mg total) by mouth daily with breakfast.  . hydrochlorothiazide (HYDRODIURIL) 25 MG tablet TAKE 1 TABLET DAILY  . INS SYRINGE/NEEDLE 1CC/28G (B-D INS  SYR MICROFINE 1CC/28G) 28G X 1/2" 1 ML MISC USE AS DIRECTED  . levothyroxine (SYNTHROID) 100 MCG tablet TAKE 1 TABLET DAILY, AND ON2 DAYS A WEEK, TAKE AN     EXTRA 1/2 TABLET  . loratadine (CLARITIN) 10 MG tablet Take 1 tablet (10 mg total) by mouth daily.  . metFORMIN (GLUCOPHAGE) 500 MG tablet TAKE 2 TABLETS 2 TIMES     DAILY WITH MEALS  . MISC NATURAL PRODUCTS PO Take by mouth daily. Amberen Menopause Relief  . Multiple Vitamin (MULTIVITAMIN) tablet Take 1 tablet by mouth daily.  Marland Kitchen nystatin cream (MYCOSTATIN) APPLY TO AFFECTED AREA TWICE A DAY  . omeprazole (PRILOSEC) 20 MG capsule TAKE 1 DAILY-ALTERNATES WITH FAMOTIDINE  . ONE TOUCH LANCETS MISC Use to check sugar twice daily Dx: E11.8  . ONETOUCH ULTRA test strip USE TO CHECK BLOOD SUGAR 2 TIMES   DAILY  . pioglitazone (ACTOS) 15 MG tablet TAKE 1 TABLET DAILY  . topiramate (TOPAMAX) 100 MG tablet Take 1 tablet (100 mg total) by mouth at bedtime.  Marland Kitchen umeclidinium-vilanterol (ANORO ELLIPTA) 62.5-25 MCG/INH AEPB Inhale 1 puff into the lungs daily.  . ziprasidone (GEODON) 40 MG capsule TAKE 1 CAPSULE TWICE DAILY WITH MEALS  . [DISCONTINUED] insulin NPH Human (NOVOLIN N) 100 UNIT/ML injection Inject 0.4 mLs (40 Units total) into the skin at bedtime.  . [DISCONTINUED] lisinopril (ZESTRIL) 5 MG tablet TAKE 1 TABLET DAILY  . [DISCONTINUED] simvastatin (ZOCOR) 40 MG tablet TAKE 1 TABLET AT BEDTIME     Allergies:   Crestor [rosuvastatin], Lipitor [atorvastatin], Lithium, and Quetiapine   Social History   Socioeconomic History  . Marital status: Married    Spouse name: Not on file  . Number of children: Not on file  . Years of education: Not on file  . Highest education level: Not on file  Occupational History  . Occupation: disabled    Employer: OTHER  Tobacco Use  . Smoking status: Former Smoker    Packs/day: 2.00    Years: 43.00    Pack years: 86.00    Types: Cigarettes    Quit date: 05/17/2010    Years since quitting: 10.1  .  Smokeless tobacco: Never Used  Vaping Use  . Vaping Use: Never used  Substance and Sexual Activity  . Alcohol use: No    Alcohol/week: 0.0 standard drinks  . Drug use: No  . Sexual activity: Yes    Partners: Male  Other Topics Concern  . Not on file  Social History Narrative   Caffeine: 2-3 coffee/am, 1 cup tea in afternoon; Lives with husband, 1 dog, 2 cats; Occupation: disability since 2009 for bipolar, previously worked at [employer]; Diet: not many fruits, good vegetables, good amt water.      Smoker 43 years; quit in 2011. No alcohol. Lives in [place]; with husband.    Social Determinants of  Health   Financial Resource Strain:   . Difficulty of Paying Living Expenses: Not on file  Food Insecurity:   . Worried About Charity fundraiser in the Last Year: Not on file  . Ran Out of Food in the Last Year: Not on file  Transportation Needs:   . Lack of Transportation (Medical): Not on file  . Lack of Transportation (Non-Medical): Not on file  Physical Activity:   . Days of Exercise per Week: Not on file  . Minutes of Exercise per Session: Not on file  Stress:   . Feeling of Stress : Not on file  Social Connections:   . Frequency of Communication with Friends and Family: Not on file  . Frequency of Social Gatherings with Friends and Family: Not on file  . Attends Religious Services: Not on file  . Active Member of Clubs or Organizations: Not on file  . Attends Archivist Meetings: Not on file  . Marital Status: Not on file     Family History: The patient's family history includes Acute lymphoblastic leukemia (age of onset: 94) in her cousin; Aneurysm in her maternal uncle; Arthritis in her mother; Asthma in her maternal uncle; Breast cancer in her maternal aunt; COPD in her sister; Cancer in her maternal uncle and paternal uncle; Cancer (age of onset: 20) in her father; Cancer (age of onset: 15) in her maternal aunt; Colon cancer (age of onset: 59) in her maternal uncle;  Diabetes in her father; Hypertension in her mother; Stroke in her mother; Thyroid disease in her father. There is no history of Coronary artery disease, Stomach cancer, Rectal cancer, or Esophageal cancer.  ROS:   Please see the history of present illness.     All other systems reviewed and are negative.  EKGs/Labs/Other Studies Reviewed:    The following studies were reviewed today:   EKG:  EKG is  ordered today.  The ekg ordered today demonstrates sinus rhythm  Recent Labs: 10/03/2019: Pro B Natriuretic peptide (BNP) 29.0 05/21/2020: ALT 17; BUN 13; Creatinine, Ser 0.78; Hemoglobin 13.2; Platelets 366; Potassium 4.4; Sodium 138; TSH 2.79  Recent Lipid Panel    Component Value Date/Time   CHOL 131 05/21/2020 0915   TRIG 239.0 (H) 05/21/2020 0915   HDL 42.80 05/21/2020 0915   CHOLHDL 3 05/21/2020 0915   VLDL 47.8 (H) 05/21/2020 0915   LDLCALC 48 12/08/2013 0824   LDLDIRECT 65.0 05/21/2020 0915     Risk Assessment/Calculations:      Physical Exam:    VS:  BP (!) 110/50 (BP Location: Right Arm, Patient Position: Sitting, Cuff Size: Large)   Pulse 86   Ht 5\' 2"  (1.575 m)   Wt 247 lb 6 oz (112.2 kg)   SpO2 98%   BMI 45.25 kg/m     Wt Readings from Last 3 Encounters:  07/02/20 247 lb 6 oz (112.2 kg)  06/11/20 243 lb (110.2 kg)  06/07/20 237 lb (107.5 kg)     GEN:  Well nourished, well developed in no acute distress HEENT: Normal NECK: No JVD; No carotid bruits LYMPHATICS: No lymphadenopathy CARDIAC: RRR, no murmurs, rubs, gallops RESPIRATORY:  Clear to auscultation, decreased breath sounds at bases ABDOMEN: Soft, non-tender, non-distended MUSCULOSKELETAL:  No edema; No deformity  SKIN: Warm and dry NEUROLOGIC:  Alert and oriented x 3 PSYCHIATRIC:  Normal affect   ASSESSMENT:    1. Dyspnea on exertion   2. Primary hypertension   3. Pure hypercholesterolemia   4. Obesity,  morbid, BMI 40.0-49.9 (HCC)    PLAN:    In order of problems listed  above:  1. Patient with dyspnea on exertion.  Prior echo echo, technically difficult study, endocardial borders not clearly defined.  Probably moderately reduced EF, 40%.  Lexiscan Myoview with apical and apical inferior defect.  Image quality poor, could be artifactual versus ischemia.  Patient symptoms are persistent.  We will plan for right and left heart cath.  If coronaries are normal, symptoms possibly COPD/obesity/deconditioning related 2. hypertension, BP controlled.  Continue lisinopril, Coreg, HCTZ for now.  Plan to stop HCTZ and titrate Coreg/lisinopril pending left heart cath results. 3. hyperlipidemia, continue statin. 4. morbidly obese, continue healthy eating diet, weight loss.  Follow-up after left heart cath.   Medication Adjustments/Labs and Tests Ordered: Current medicines are reviewed at length with the patient today.  Concerns regarding medicines are outlined above.  Orders Placed This Encounter  Procedures  . CBC  . Basic Metabolic Panel (BMET)  . EKG 12-Lead   No orders of the defined types were placed in this encounter.   Patient Instructions  Medication Instructions:  Your physician recommends that you continue on your current medications as directed. Please refer to the Current Medication list given to you today.  *If you need a refill on your cardiac medications before your next appointment, please call your pharmacy*   Lab Work:  Your physician recommends that you have lab work TODAY: Bmet & CBC  If you have labs (blood work) drawn today and your tests are completely normal, you will receive your results only by: Marland Kitchen MyChart Message (if you have MyChart) OR . A paper copy in the mail If you have any lab test that is abnormal or we need to change your treatment, we will call you to review the results.   Testing/Procedures:   COVID PRE- TEST: You will need a COVID TEST prior to the procedure:  LOCATION: [facility]  site.  DATE/TIME: __________________  ____________________  [facility] Cardiac Cath Instructions   You are scheduled for a Cardiac Cath on:_________________________  Please arrive at _______am on the day of your procedure  Please expect a call from our [place] to pre-register you  Do not eat/drink anything after midnight  Someone will need to drive you home  It is recommended someone be with you for the first 24 hours after your procedure  Wear clothes that are easy to get on/off and wear slip on shoes if possible   Medications bring a current list of all medications with you  ___ You may take all of your medications the morning of your procedure with enough water to swallow safely  _XX_ Do not take these medications before your procedure:  metFORMIN (GLUCOPHAGE) 500 MG tablet: HOLD 24 hrs prior and 48 hrs after procedure  pioglitazone (ACTOS) 15 MG tablet & glimepiride (AMARYL) 1 MG tablet : HOLD day of your procedure  NOVOLIN N 100 UNIT/ML injection: TAKE 1/2 normal dose the day of procedure  lisinopril (ZESTRIL) 5 MG tablet: HOLD day of procedure   Day of your procedure: Arrive at the [facility] entrance.  Free valet service is available.  After entering the [facility] please check-in at the registration desk (1st desk on your right) to receive your armband. After receiving your armband someone will escort you to the cardiac cath/special procedures waiting area.  The usual length of stay after your procedure is about 2 to 3 hours.  This can vary.  If you have any questions, please call our office at [phone], or you may call the cardiac cath lab at [facility] directly at [phone]    Follow-Up: At [facility], you and your health needs are our priority.  As part of our continuing mission to provide you with exceptional heart care, we have created designated Provider Care Teams.  These Care Teams include your primary Cardiologist (physician)  and Advanced Practice Providers (APPs -  Physician Assistants and Nurse Practitioners) who all work together to provide you with the care you need, when you need it.  We recommend signing up for the patient portal called "MyChart".  Sign up information is provided on this After Visit Summary.  MyChart is used to connect with patients for Virtual Visits (Telemedicine).  Patients are able to view lab/test results, encounter notes, upcoming appointments, etc.  Non-urgent messages can be sent to your provider as well.   To learn more about what you can do with MyChart, go to [email or web address].    Your next appointment:    Follow up 1-2 weeks after cardiac cath  The format for your next appointment:   In Person  Provider:   Kate Sable, MD       Signed, Kate Sable, MD  07/02/2020 4:03 PM    [place]

## 2020-07-02 NOTE — Telephone Encounter (Signed)
Noted  

## 2020-07-02 NOTE — Patient Instructions (Signed)
Medication Instructions:  Your physician recommends that you continue on your current medications as directed. Please refer to the Current Medication list given to you today.  *If you need a refill on your cardiac medications before your next appointment, please call your pharmacy*   Lab Work:  Your physician recommends that you have lab work TODAY: Bmet & CBC  If you have labs (blood work) drawn today and your tests are completely normal, you will receive your results only by: Marland Kitchen MyChart Message (if you have MyChart) OR . A paper copy in the mail If you have any lab test that is abnormal or we need to change your treatment, we will call you to review the results.   Testing/Procedures:   COVID PRE- TEST: You will need a COVID TEST prior to the procedure:  LOCATION: [place] [facility] Testing site.  DATE/TIME: __________________  ____________________  [facility] Cardiac Cath Instructions   You are scheduled for a Cardiac Cath on:_________________________  Please arrive at _______am on the day of your procedure  Please expect a call from our [place] to pre-register you  Do not eat/drink anything after midnight  Someone will need to drive you home  It is recommended someone be with you for the first 24 hours after your procedure  Wear clothes that are easy to get on/off and wear slip on shoes if possible   Medications bring a current list of all medications with you  ___ You may take all of your medications the morning of your procedure with enough water to swallow safely  _XX_ Do not take these medications before your procedure:  metFORMIN (GLUCOPHAGE) 500 MG tablet: HOLD 24 hrs prior and 48 hrs after procedure  pioglitazone (ACTOS) 15 MG tablet & glimepiride (AMARYL) 1 MG tablet : HOLD day of your procedure  NOVOLIN N 100 UNIT/ML injection: TAKE 1/2 normal dose the day of procedure  lisinopril (ZESTRIL) 5 MG tablet: HOLD day of  procedure   Day of your procedure: Arrive at the [facility] entrance.  Free valet service is available.  After entering the [place] please check-in at the registration desk (1st desk on your right) to receive your armband. After receiving your armband someone will escort you to the cardiac cath/special procedures waiting area.  The usual length of stay after your procedure is about 2 to 3 hours.  This can vary.  If you have any questions, please call our office at [phone], or you may call the cardiac cath lab at [facility] directly at [phone]    Follow-Up: At [facility], you and your health needs are our priority.  As part of our continuing mission to provide you with exceptional heart care, we have created designated Provider Care Teams.  These Care Teams include your primary Cardiologist (physician) and Advanced Practice Providers (APPs -  Physician Assistants and Nurse Practitioners) who all work together to provide you with the care you need, when you need it.  We recommend signing up for the patient portal called "MyChart".  Sign up information is provided on this After Visit Summary.  MyChart is used to connect with patients for Virtual Visits (Telemedicine).  Patients are able to view lab/test results, encounter notes, upcoming appointments, etc.  Non-urgent messages can be sent to your provider as well.   To learn more about what you can do with MyChart, go to [email or web address].    Your next appointment:    Follow up 1-2 weeks after cardiac  cath  The format for your next appointment:   In Person  Provider:   Kate Sable, MD

## 2020-07-02 NOTE — H&P (View-Only) (Signed)
Cardiology Office Note:    Date:  07/02/2020   ID:  [name], DOB 1951-05-13, MRN [id number]  PCP:  Ria Bush, MD  [facility] Cardiologist:  No primary care provider on file.  [facility] Electrophysiologist:  None   Referring MD: Ria Bush, MD   Chief Complaint  Patient presents with  . other    Follow up Myoview and Echo. Meds reviewed by the pt. verbally. Pt. c/o chest pain and shortness of breath.     History of Present Illness:    [name] is a 69 y.o. female with a hx of hypertension, hyperlipidemia, diabetes, former smoker x40 years, COPD, OSA who presents for follow-up.  Patient last seen due to shortness of breath and abnormal echo. Echocardiogram showed difficult to obtain ABGs, EF probably 40%.  Lexiscan Myoview was performed to evaluate presence of ischemia due to persistent dyspnea on exertion.  Patient still has dyspnea on exertion, endorses chest discomfort over the past 3 days.  Her brother has a history of CAD with 5 stents in his 3s.  Prior notes  Patient with history of COPD and shortness of breath usually with exertion for couple of years. Follows up with pulmonary medicine. An echocardiogram was obtained due to shortness of breath. Images were technically difficult, could not evaluate endocardial borders. EF probably mild to moderately reduced, 40%.  She denies chest pain.  Previously use a CPAP for OSA but stopped using this due to severe gagging.  Recently had an upper endoscopy with esophageal dilatation, resolving symptoms of gagging for the past 1 to 2 weeks.  She has lost about 60 pounds over the past year.  Is working on losing more weight.  States having stress testing and left heart cath years ago which was normal.    Past Medical History:  Diagnosis Date  . Anxiety   . Bipolar disorder ([place])    has stopped seeing psychiatrist  . Cataract   . Chronic bronchitis   . COPD (chronic obstructive pulmonary disease) ([place])    . Coronary artery disease, non-occlusive   . Depression   . Dyspnea 2012   s/p pulm/cards w/u WNL, thought due to obesity/deconditioning  . Fibromyalgia   . GERD (gastroesophageal reflux disease)   . History of chicken pox   . HLD (hyperlipidemia)   . Hypertension   . Hypothyroidism   . Migraine   . OSA (obstructive sleep apnea) 03/16/2011  . Sleep apnea   . T2DM (type 2 diabetes mellitus) ([place]) 2012   DM education 06/2011  . Urinary incontinence     Past Surgical History:  Procedure Laterality Date  . BREAST CYST ASPIRATION Right 2003  . cardiac catherization  03/2011   x3 [facility]), mild nonobstructive CAD  . COLONOSCOPY  12/2011   hyperplastic polyp x2,. diverticulosis, rec rpt 10 yrs  . ESOPHAGOGASTRODUODENOSCOPY  2006  . KNEE SURGERY  2006   left, torn meniscus  . TOTAL ABDOMINAL HYSTERECTOMY  2004   fibroids, cervix remained  . TOTAL KNEE ARTHROPLASTY Left 08/2012   [name], [facility]    Current Medications: Current Meds  Medication Sig  . Acetaminophen (TYLENOL ARTHRITIS PAIN PO) Take by mouth in the morning and at bedtime.  Marland Kitchen albuterol (VENTOLIN HFA) 108 (90 Base) MCG/ACT inhaler Inhale 2 puffs into the lungs every 6 (six) hours as needed for wheezing or shortness of breath.  . APPLE CIDER VINEGAR PO Take by mouth.  Marland Kitchen aspirin EC 81 MG tablet Take 81 mg  by mouth daily.   . [place]-Glucosamine-Vit D (OSTEO BI-FLEX ONE PER DAY PO) Take by mouth daily.   . butalbital-acetaminophen-caffeine (FIORICET) 50-325-40 MG tablet Take 1 tablet by mouth 2 (two) times daily as needed for headache or migraine.  . carvedilol (COREG) 3.125 MG tablet TAKE 1 TABLET TWICE A DAY  WITH MEALS  . famotidine (PEPCID) 20 MG tablet Take 20 mg by mouth daily. Alternates with omeprazole  . glimepiride (AMARYL) 1 MG tablet Take 1 tablet (1 mg total) by mouth daily with breakfast.  . hydrochlorothiazide (HYDRODIURIL) 25 MG tablet TAKE 1 TABLET DAILY  . INS SYRINGE/NEEDLE 1CC/28G (B-D INS  SYR MICROFINE 1CC/28G) 28G X 1/2" 1 ML MISC USE AS DIRECTED  . levothyroxine (SYNTHROID) 100 MCG tablet TAKE 1 TABLET DAILY, AND ON2 DAYS A WEEK, TAKE AN     EXTRA 1/2 TABLET  . loratadine (CLARITIN) 10 MG tablet Take 1 tablet (10 mg total) by mouth daily.  . metFORMIN (GLUCOPHAGE) 500 MG tablet TAKE 2 TABLETS 2 TIMES     DAILY WITH MEALS  . MISC NATURAL PRODUCTS PO Take by mouth daily. Amberen Menopause Relief  . Multiple Vitamin (MULTIVITAMIN) tablet Take 1 tablet by mouth daily.  Marland Kitchen nystatin cream (MYCOSTATIN) APPLY TO AFFECTED AREA TWICE A DAY  . omeprazole (PRILOSEC) 20 MG capsule TAKE 1 DAILY-ALTERNATES WITH FAMOTIDINE  . ONE TOUCH LANCETS MISC Use to check sugar twice daily Dx: E11.8  . ONETOUCH ULTRA test strip USE TO CHECK BLOOD SUGAR 2 TIMES   DAILY  . pioglitazone (ACTOS) 15 MG tablet TAKE 1 TABLET DAILY  . topiramate (TOPAMAX) 100 MG tablet Take 1 tablet (100 mg total) by mouth at bedtime.  Marland Kitchen umeclidinium-vilanterol (ANORO ELLIPTA) 62.5-25 MCG/INH AEPB Inhale 1 puff into the lungs daily.  . ziprasidone (GEODON) 40 MG capsule TAKE 1 CAPSULE TWICE DAILY WITH MEALS  . [DISCONTINUED] insulin NPH Human (NOVOLIN N) 100 UNIT/ML injection Inject 0.4 mLs (40 Units total) into the skin at bedtime.  . [DISCONTINUED] lisinopril (ZESTRIL) 5 MG tablet TAKE 1 TABLET DAILY  . [DISCONTINUED] simvastatin (ZOCOR) 40 MG tablet TAKE 1 TABLET AT BEDTIME     Allergies:   Crestor [rosuvastatin], Lipitor [atorvastatin], Lithium, and Quetiapine   Social History   Socioeconomic History  . Marital status: Married    Spouse name: Not on file  . Number of children: Not on file  . Years of education: Not on file  . Highest education level: Not on file  Occupational History  . Occupation: disabled    Employer: OTHER  Tobacco Use  . Smoking status: Former Smoker    Packs/day: 2.00    Years: 43.00    Pack years: 86.00    Types: Cigarettes    Quit date: 05/17/2010    Years since quitting: 10.1  .  Smokeless tobacco: Never Used  Vaping Use  . Vaping Use: Never used  Substance and Sexual Activity  . Alcohol use: No    Alcohol/week: 0.0 standard drinks  . Drug use: No  . Sexual activity: Yes    Partners: Male  Other Topics Concern  . Not on file  Social History Narrative   Caffeine: 2-3 coffee/am, 1 cup tea in afternoon; Lives with husband, 1 dog, 2 cats; Occupation: disability since 2009 for bipolar, previously worked at [employer]; Diet: not many fruits, good vegetables, good amt water.      Smoker 43 years; quit in 2011. No alcohol. Lives in [place]; with husband.    Social Determinants of  Health   Financial Resource Strain:   . Difficulty of Paying Living Expenses: Not on file  Food Insecurity:   . Worried About Charity fundraiser in the Last Year: Not on file  . Ran Out of Food in the Last Year: Not on file  Transportation Needs:   . Lack of Transportation (Medical): Not on file  . Lack of Transportation (Non-Medical): Not on file  Physical Activity:   . Days of Exercise per Week: Not on file  . Minutes of Exercise per Session: Not on file  Stress:   . Feeling of Stress : Not on file  Social Connections:   . Frequency of Communication with Friends and Family: Not on file  . Frequency of Social Gatherings with Friends and Family: Not on file  . Attends Religious Services: Not on file  . Active Member of Clubs or Organizations: Not on file  . Attends Archivist Meetings: Not on file  . Marital Status: Not on file     Family History: The patient's family history includes Acute lymphoblastic leukemia (age of onset: 35) in her cousin; Aneurysm in her maternal uncle; Arthritis in her mother; Asthma in her maternal uncle; Breast cancer in her maternal aunt; COPD in her sister; Cancer in her maternal uncle and paternal uncle; Cancer (age of onset: 13) in her father; Cancer (age of onset: 39) in her maternal aunt; Colon cancer (age of onset: 42) in her maternal uncle;  Diabetes in her father; Hypertension in her mother; Stroke in her mother; Thyroid disease in her father. There is no history of Coronary artery disease, Stomach cancer, Rectal cancer, or Esophageal cancer.  ROS:   Please see the history of present illness.     All other systems reviewed and are negative.  EKGs/Labs/Other Studies Reviewed:    The following studies were reviewed today:   EKG:  EKG is  ordered today.  The ekg ordered today demonstrates sinus rhythm  Recent Labs: 10/03/2019: Pro B Natriuretic peptide (BNP) 29.0 05/21/2020: ALT 17; BUN 13; Creatinine, Ser 0.78; Hemoglobin 13.2; Platelets 366; Potassium 4.4; Sodium 138; TSH 2.79  Recent Lipid Panel    Component Value Date/Time   CHOL 131 05/21/2020 0915   TRIG 239.0 (H) 05/21/2020 0915   HDL 42.80 05/21/2020 0915   CHOLHDL 3 05/21/2020 0915   VLDL 47.8 (H) 05/21/2020 0915   LDLCALC 48 12/08/2013 0824   LDLDIRECT 65.0 05/21/2020 0915     Risk Assessment/Calculations:      Physical Exam:    VS:  BP (!) 110/50 (BP Location: Right Arm, Patient Position: Sitting, Cuff Size: Large)   Pulse 86   Ht 5\' 2"  (1.575 m)   Wt 247 lb 6 oz (112.2 kg)   SpO2 98%   BMI 45.25 kg/m     Wt Readings from Last 3 Encounters:  07/02/20 247 lb 6 oz (112.2 kg)  06/11/20 243 lb (110.2 kg)  06/07/20 237 lb (107.5 kg)     GEN:  Well nourished, well developed in no acute distress HEENT: Normal NECK: No JVD; No carotid bruits LYMPHATICS: No lymphadenopathy CARDIAC: RRR, no murmurs, rubs, gallops RESPIRATORY:  Clear to auscultation, decreased breath sounds at bases ABDOMEN: Soft, non-tender, non-distended MUSCULOSKELETAL:  No edema; No deformity  SKIN: Warm and dry NEUROLOGIC:  Alert and oriented x 3 PSYCHIATRIC:  Normal affect   ASSESSMENT:    1. Dyspnea on exertion   2. Primary hypertension   3. Pure hypercholesterolemia   4. Obesity,  morbid, BMI 40.0-49.9 (HCC)    PLAN:    In order of problems listed  above:  1. Patient with dyspnea on exertion.  Prior echo echo, technically difficult study, endocardial borders not clearly defined.  Probably moderately reduced EF, 40%.  Lexiscan Myoview with apical and apical inferior defect.  Image quality poor, could be artifactual versus ischemia.  Patient symptoms are persistent.  We will plan for right and left heart cath.  If coronaries are normal, symptoms possibly COPD/obesity/deconditioning related 2. hypertension, BP controlled.  Continue lisinopril, Coreg, HCTZ for now.  Plan to stop HCTZ and titrate Coreg/lisinopril pending left heart cath results. 3. hyperlipidemia, continue statin. 4. morbidly obese, continue healthy eating diet, weight loss.  Follow-up after left heart cath.   Medication Adjustments/Labs and Tests Ordered: Current medicines are reviewed at length with the patient today.  Concerns regarding medicines are outlined above.  Orders Placed This Encounter  Procedures  . CBC  . Basic Metabolic Panel (BMET)  . EKG 12-Lead   No orders of the defined types were placed in this encounter.   Patient Instructions  Medication Instructions:  Your physician recommends that you continue on your current medications as directed. Please refer to the Current Medication list given to you today.  *If you need a refill on your cardiac medications before your next appointment, please call your pharmacy*   Lab Work:  Your physician recommends that you have lab work TODAY: Bmet & CBC  If you have labs (blood work) drawn today and your tests are completely normal, you will receive your results only by: Marland Kitchen MyChart Message (if you have MyChart) OR . A paper copy in the mail If you have any lab test that is abnormal or we need to change your treatment, we will call you to review the results.   Testing/Procedures:   COVID PRE- TEST: You will need a COVID TEST prior to the procedure:  LOCATION: [place] [facility]  site.  DATE/TIME: __________________  ____________________  [facility] Cardiac Cath Instructions   You are scheduled for a Cardiac Cath on:_________________________  Please arrive at _______am on the day of your procedure  Please expect a call from our [place] to pre-register you  Do not eat/drink anything after midnight  Someone will need to drive you home  It is recommended someone be with you for the first 24 hours after your procedure  Wear clothes that are easy to get on/off and wear slip on shoes if possible   Medications bring a current list of all medications with you  ___ You may take all of your medications the morning of your procedure with enough water to swallow safely  _XX_ Do not take these medications before your procedure:  metFORMIN (GLUCOPHAGE) 500 MG tablet: HOLD 24 hrs prior and 48 hrs after procedure  pioglitazone (ACTOS) 15 MG tablet & glimepiride (AMARYL) 1 MG tablet : HOLD day of your procedure  NOVOLIN N 100 UNIT/ML injection: TAKE 1/2 normal dose the day of procedure  lisinopril (ZESTRIL) 5 MG tablet: HOLD day of procedure   Day of your procedure: Arrive at the [facility] entrance.  Free valet service is available.  After entering the [place] please check-in at the registration desk (1st desk on your right) to receive your armband. After receiving your armband someone will escort you to the cardiac cath/special procedures waiting area.  The usual length of stay after your procedure is about 2 to 3 hours.  This can vary.  If you have any questions, please call our office at [phone], or you may call the cardiac cath lab at [facility] directly at [phone]    Follow-Up: At [facility], you and your health needs are our priority.  As part of our continuing mission to provide you with exceptional heart care, we have created designated Provider Care Teams.  These Care Teams include your primary Cardiologist (physician)  and Advanced Practice Providers (APPs -  Physician Assistants and Nurse Practitioners) who all work together to provide you with the care you need, when you need it.  We recommend signing up for the patient portal called "MyChart".  Sign up information is provided on this After Visit Summary.  MyChart is used to connect with patients for Virtual Visits (Telemedicine).  Patients are able to view lab/test results, encounter notes, upcoming appointments, etc.  Non-urgent messages can be sent to your provider as well.   To learn more about what you can do with MyChart, go to [email or web address].    Your next appointment:    Follow up 1-2 weeks after cardiac cath  The format for your next appointment:   In Person  Provider:   Kate Sable, MD       Signed, Kate Sable, MD  07/02/2020 4:03 PM    [place]

## 2020-07-03 LAB — CBC
Hematocrit: 38.1 % (ref 34.0–46.6)
Hemoglobin: 13.2 g/dL (ref 11.1–15.9)
MCH: 31.7 pg (ref 26.6–33.0)
MCHC: 34.6 g/dL (ref 31.5–35.7)
MCV: 92 fL (ref 79–97)
Platelets: 436 10*3/uL (ref 150–450)
RBC: 4.16 x10E6/uL (ref 3.77–5.28)
RDW: 13.2 % (ref 11.7–15.4)
WBC: 14.6 10*3/uL — ABNORMAL HIGH (ref 3.4–10.8)

## 2020-07-03 LAB — BASIC METABOLIC PANEL
BUN/Creatinine Ratio: 16 (ref 12–28)
BUN: 12 mg/dL (ref 8–27)
CO2: 19 mmol/L — ABNORMAL LOW (ref 20–29)
Calcium: 9.9 mg/dL (ref 8.7–10.3)
Chloride: 91 mmol/L — ABNORMAL LOW (ref 96–106)
Creatinine, Ser: 0.74 mg/dL (ref 0.57–1.00)
GFR calc Af Amer: 97 mL/min/{1.73_m2} (ref >59)
GFR calc non Af Amer: 84 mL/min/{1.73_m2} (ref >59)
Glucose: 128 mg/dL — ABNORMAL HIGH (ref 65–99)
Potassium: 3.8 mmol/L (ref 3.5–5.2)
Sodium: 132 mmol/L — ABNORMAL LOW (ref 134–144)

## 2020-07-05 ENCOUNTER — Telehealth: Payer: Self-pay | Admitting: Pulmonary Disease

## 2020-07-05 DIAGNOSIS — J449 Chronic obstructive pulmonary disease, unspecified: Secondary | ICD-10-CM

## 2020-07-05 NOTE — Telephone Encounter (Signed)
ONO reviewed by Dr. Dietrich Pates 2L QHS.  Patient is aware of results and voiced her understanding.  Order has been placed for 2L QHS, as patient is agreeable.  Nothing further needed.

## 2020-07-06 ENCOUNTER — Encounter: Payer: Self-pay | Admitting: Family Medicine

## 2020-07-07 DIAGNOSIS — J449 Chronic obstructive pulmonary disease, unspecified: Secondary | ICD-10-CM | POA: Diagnosis not present

## 2020-07-08 DIAGNOSIS — H40053 Ocular hypertension, bilateral: Secondary | ICD-10-CM | POA: Diagnosis not present

## 2020-07-08 DIAGNOSIS — E119 Type 2 diabetes mellitus without complications: Secondary | ICD-10-CM | POA: Diagnosis not present

## 2020-07-08 LAB — HM DIABETES EYE EXAM

## 2020-07-09 ENCOUNTER — Other Ambulatory Visit: Payer: Self-pay

## 2020-07-09 ENCOUNTER — Encounter: Payer: Self-pay | Admitting: Family Medicine

## 2020-07-09 ENCOUNTER — Other Ambulatory Visit
Admission: RE | Admit: 2020-07-09 | Discharge: 2020-07-09 | Disposition: A | Discharge status: Home / Self Care | Payer: Medicare HMO | Source: Ambulatory Visit | Attending: Internal Medicine | Admitting: Internal Medicine

## 2020-07-09 ENCOUNTER — Ambulatory Visit: Payer: Medicare HMO

## 2020-07-09 DIAGNOSIS — Z20822 Contact with and (suspected) exposure to covid-19: Secondary | ICD-10-CM | POA: Diagnosis not present

## 2020-07-09 DIAGNOSIS — Z01812 Encounter for preprocedural laboratory examination: Secondary | ICD-10-CM | POA: Insufficient documentation

## 2020-07-09 LAB — SARS CORONAVIRUS 2 (TAT 6-24 HRS): SARS Coronavirus 2: NEGATIVE

## 2020-07-12 ENCOUNTER — Encounter: Payer: Self-pay | Admitting: Family Medicine

## 2020-07-13 ENCOUNTER — Other Ambulatory Visit: Payer: Self-pay

## 2020-07-13 ENCOUNTER — Encounter
Admission: RE | Disposition: A | Discharge status: Home / Self Care | Payer: Self-pay | Source: Home / Self Care | Attending: Internal Medicine

## 2020-07-13 ENCOUNTER — Encounter: Payer: Self-pay | Admitting: Internal Medicine

## 2020-07-13 ENCOUNTER — Ambulatory Visit
Admission: RE | Admit: 2020-07-13 | Discharge: 2020-07-13 | Disposition: A | Discharge status: Home / Self Care | Payer: Medicare HMO | Attending: Internal Medicine | Admitting: Internal Medicine

## 2020-07-13 ENCOUNTER — Encounter: Payer: Self-pay | Admitting: Family Medicine

## 2020-07-13 DIAGNOSIS — I2510 Atherosclerotic heart disease of native coronary artery without angina pectoris: Secondary | ICD-10-CM | POA: Diagnosis not present

## 2020-07-13 DIAGNOSIS — R9439 Abnormal result of other cardiovascular function study: Secondary | ICD-10-CM

## 2020-07-13 DIAGNOSIS — Z79899 Other long term (current) drug therapy: Secondary | ICD-10-CM | POA: Diagnosis not present

## 2020-07-13 DIAGNOSIS — Z8249 Family history of ischemic heart disease and other diseases of the circulatory system: Secondary | ICD-10-CM | POA: Insufficient documentation

## 2020-07-13 DIAGNOSIS — Z87891 Personal history of nicotine dependence: Secondary | ICD-10-CM | POA: Insufficient documentation

## 2020-07-13 DIAGNOSIS — E7800 Pure hypercholesterolemia, unspecified: Secondary | ICD-10-CM | POA: Insufficient documentation

## 2020-07-13 DIAGNOSIS — Z6841 Body Mass Index (BMI) 40.0 and over, adult: Secondary | ICD-10-CM | POA: Diagnosis not present

## 2020-07-13 DIAGNOSIS — Z79890 Hormone replacement therapy (postmenopausal): Secondary | ICD-10-CM | POA: Diagnosis not present

## 2020-07-13 DIAGNOSIS — R0600 Dyspnea, unspecified: Secondary | ICD-10-CM | POA: Diagnosis not present

## 2020-07-13 DIAGNOSIS — I10 Essential (primary) hypertension: Secondary | ICD-10-CM | POA: Diagnosis not present

## 2020-07-13 DIAGNOSIS — Z7982 Long term (current) use of aspirin: Secondary | ICD-10-CM | POA: Diagnosis not present

## 2020-07-13 DIAGNOSIS — Z7984 Long term (current) use of oral hypoglycemic drugs: Secondary | ICD-10-CM | POA: Diagnosis not present

## 2020-07-13 DIAGNOSIS — E785 Hyperlipidemia, unspecified: Secondary | ICD-10-CM | POA: Insufficient documentation

## 2020-07-13 DIAGNOSIS — R0602 Shortness of breath: Secondary | ICD-10-CM | POA: Insufficient documentation

## 2020-07-13 DIAGNOSIS — R0609 Other forms of dyspnea: Secondary | ICD-10-CM

## 2020-07-13 HISTORY — PX: RIGHT/LEFT HEART CATH AND CORONARY ANGIOGRAPHY: CATH118266

## 2020-07-13 LAB — GLUCOSE, CAPILLARY: Glucose-Capillary: 143 mg/dL — ABNORMAL HIGH (ref 70–99)

## 2020-07-13 SURGERY — RIGHT/LEFT HEART CATH AND CORONARY ANGIOGRAPHY
Anesthesia: Moderate Sedation

## 2020-07-13 MED ORDER — VERAPAMIL HCL 2.5 MG/ML IV SOLN
INTRAVENOUS | Status: DC | PRN
  Administered 2020-07-13: 2.5 mg via INTRAVENOUS

## 2020-07-13 MED ORDER — HEPARIN SODIUM (PORCINE) 1000 UNIT/ML IJ SOLN
INTRAMUSCULAR | Status: DC | PRN
  Administered 2020-07-13: 5000 [IU] via INTRAVENOUS

## 2020-07-13 MED ORDER — LABETALOL HCL 5 MG/ML IV SOLN: 10.0000 mg | INTRAVENOUS | Status: DC | PRN

## 2020-07-13 MED ORDER — FENTANYL CITRATE (PF) 100 MCG/2ML IJ SOLN
INTRAMUSCULAR | Status: AC
  Filled 2020-07-13: qty 2

## 2020-07-13 MED ORDER — SODIUM CHLORIDE 0.9 % IV SOLN: 250.0000 mL | INTRAVENOUS | Status: DC | PRN

## 2020-07-13 MED ORDER — HEPARIN (PORCINE) IN NACL 1000-0.9 UT/500ML-% IV SOLN
INTRAVENOUS | Status: AC
  Filled 2020-07-13: qty 1000

## 2020-07-13 MED ORDER — MIDAZOLAM HCL 2 MG/2ML IJ SOLN
INTRAMUSCULAR | Status: AC
  Filled 2020-07-13: qty 2

## 2020-07-13 MED ORDER — ONDANSETRON HCL 4 MG/2ML IJ SOLN: 4.0000 mg | Freq: Four times a day (QID) | INTRAMUSCULAR | Status: DC | PRN

## 2020-07-13 MED ORDER — SODIUM CHLORIDE 0.9 % IV SOLN
INTRAVENOUS | Status: DC
  Administered 2020-07-13: 10 mL via INTRAVENOUS

## 2020-07-13 MED ORDER — SODIUM CHLORIDE 0.9% FLUSH: 3.0000 mL | INTRAVENOUS | Status: DC | PRN

## 2020-07-13 MED ORDER — HYDRALAZINE HCL 20 MG/ML IJ SOLN: 10.0000 mg | INTRAMUSCULAR | Status: DC | PRN

## 2020-07-13 MED ORDER — ACETAMINOPHEN 325 MG PO TABS: 650.0000 mg | ORAL_TABLET | ORAL | Status: DC | PRN

## 2020-07-13 MED ORDER — LIDOCAINE HCL (PF) 1 % IJ SOLN
INTRAMUSCULAR | Status: AC
  Filled 2020-07-13: qty 30

## 2020-07-13 MED ORDER — METFORMIN HCL 500 MG PO TABS
1000.0000 mg | ORAL_TABLET | Freq: Two times a day (BID) | ORAL | Status: DC
Start: 2020-07-16 — End: 2020-10-08

## 2020-07-13 MED ORDER — LIDOCAINE HCL (PF) 1 % IJ SOLN
INTRAMUSCULAR | Status: DC | PRN
  Administered 2020-07-13: 2 mL

## 2020-07-13 MED ORDER — HEPARIN SODIUM (PORCINE) 1000 UNIT/ML IJ SOLN
INTRAMUSCULAR | Status: AC
  Filled 2020-07-13: qty 1

## 2020-07-13 MED ORDER — HEPARIN (PORCINE) IN NACL 1000-0.9 UT/500ML-% IV SOLN
INTRAVENOUS | Status: DC | PRN
  Administered 2020-07-13: 500 mL

## 2020-07-13 MED ORDER — IOHEXOL 300 MG/ML  SOLN
INTRAMUSCULAR | Status: DC | PRN
  Administered 2020-07-13: 80 mL

## 2020-07-13 MED ORDER — MIDAZOLAM HCL 2 MG/2ML IJ SOLN
INTRAMUSCULAR | Status: DC | PRN
  Administered 2020-07-13: 0.5 mg via INTRAVENOUS

## 2020-07-13 MED ORDER — SODIUM CHLORIDE 0.9 % IV SOLN: INTRAVENOUS | Status: DC

## 2020-07-13 MED ORDER — SODIUM CHLORIDE 0.9% FLUSH: 3.0000 mL | Freq: Two times a day (BID) | INTRAVENOUS | Status: DC

## 2020-07-13 MED ORDER — FENTANYL CITRATE (PF) 100 MCG/2ML IJ SOLN
INTRAMUSCULAR | Status: DC | PRN
  Administered 2020-07-13: 12.5 ug via INTRAVENOUS

## 2020-07-13 MED ORDER — SODIUM CHLORIDE 0.9% FLUSH
3.0000 mL | Freq: Two times a day (BID) | INTRAVENOUS | Status: DC
  Administered 2020-07-13: 3 mL via INTRAVENOUS

## 2020-07-13 MED ORDER — VERAPAMIL HCL 2.5 MG/ML IV SOLN
INTRAVENOUS | Status: AC
  Filled 2020-07-13: qty 2

## 2020-07-13 SURGICAL SUPPLY — 12 items
CATH 5F 110X4 TIG (CATHETERS) ×2 IMPLANT
CATH BALLN WEDGE 5F 110CM (CATHETERS) ×2 IMPLANT
CATH INFINITI 5FR ANG PIGTAIL (CATHETERS) ×2 IMPLANT
CATH INFINITI JR4 5F (CATHETERS) ×2 IMPLANT
DEVICE RAD COMP TR BAND LRG (VASCULAR PRODUCTS) ×2 IMPLANT
GLIDESHEATH SLEND SS 6F .021 (SHEATH) ×2 IMPLANT
GUIDEWIRE .025 260CM (WIRE) ×2 IMPLANT
GUIDEWIRE INQWIRE 1.5J.035X260 (WIRE) ×1 IMPLANT
INQWIRE 1.5J .035X260CM (WIRE) ×2
KIT MANI 3VAL PERCEP (MISCELLANEOUS) ×2 IMPLANT
PACK CARDIAC CATH (CUSTOM PROCEDURE TRAY) ×2 IMPLANT
SHEATH GLIDE SLENDER 4/5FR (SHEATH) ×2 IMPLANT

## 2020-07-13 NOTE — Progress Notes (Signed)
Right radial area c/d/i no s/s hematoma, discharged to home without event. No c/o's

## 2020-07-13 NOTE — Progress Notes (Signed)
Dr. Saunders Revel spoke to patient's husband regarding procedure and need for cabg in future. Patient aware that she will need surgery.  TR band in place, per protocol deflating. Tolerated sandwich/soda.  No c.o's

## 2020-07-13 NOTE — Interval H&P Note (Signed)
History and Physical Interval Note:  07/13/2020 7:42 AM  [name]  has presented today for surgery, with the diagnosis of shortness of breath and abnormal stress test  The various methods of treatment have been discussed with the patient and family. After consideration of risks, benefits and other options for treatment, the patient has consented to  Procedure(s): RIGHT/LEFT HEART CATH AND CORONARY ANGIOGRAPHY (N/A) as a surgical intervention.  The patient's history has been reviewed, patient examined, no change in status, stable for surgery.  I have reviewed the patient's chart and labs.  Questions were answered to the patient's satisfaction.    Cath Lab Visit (complete for each Cath Lab visit)  Clinical Evaluation Leading to the Procedure:   ACS: No.  Non-ACS:    Anginal Classification: CCS III  Anti-ischemic medical therapy: Minimal Therapy (1 class of medications)  Non-Invasive Test Results: Intermediate-risk stress test findings: cardiac mortality 1-3%/year  Prior CABG: No previous CABG  [name]

## 2020-07-13 NOTE — Progress Notes (Signed)
Patient arrived to  Pre-op, awake/alert x4  Only c/o is that she is dizzy. States becomes short of breath after 4-5 steps. Sats 97%, denies chest discomfort, cath lab aware.

## 2020-07-20 ENCOUNTER — Encounter: Payer: Medicare HMO | Admitting: Surgery

## 2020-07-21 ENCOUNTER — Encounter: Payer: Self-pay | Admitting: Family Medicine

## 2020-07-21 ENCOUNTER — Encounter: Payer: Self-pay | Admitting: Surgery

## 2020-07-21 ENCOUNTER — Institutional Professional Consult (permissible substitution): Payer: Medicare HMO | Admitting: Surgery

## 2020-07-21 ENCOUNTER — Ambulatory Visit: Payer: Medicare HMO

## 2020-07-21 ENCOUNTER — Other Ambulatory Visit: Payer: Self-pay

## 2020-07-21 ENCOUNTER — Other Ambulatory Visit: Payer: Self-pay | Admitting: *Deleted

## 2020-07-21 VITALS — BP 100/64 | HR 93 | Resp 18 | Ht 62.0 in | Wt 237.0 lb

## 2020-07-21 DIAGNOSIS — I2510 Atherosclerotic heart disease of native coronary artery without angina pectoris: Secondary | ICD-10-CM

## 2020-07-21 NOTE — Progress Notes (Signed)
Cardiothoracic Surgery Consultation  PCP is Ria Bush, MD Referring Provider is End, Harrell Gave, MD  Chief Complaint  Patient presents with   Coronary Artery Disease    Surgical consult, Cardiac Cath 07/13/20, ECHO 06/09/20    HPI:  The patient is a 69 year old woman with history of type 2 diabetes, hypertension, hyperlipidemia, hypothyroidism, remote smoking and COPD, bipolar disorder, fibromyalgia, GERD, OSA who reports a history of exertional shortness of breath for the last 10 years which she said is worsening recently. She has also developed substernal chest discomfort and generalized weakness with exertion and said that walking from her car today completely wore her out. She has been worked up by pulmonary medicine and PFT showed no significant obstruction or restrictive disease. A 2D echocardiogram on 06/15/2020 was a poor quality study due to poor acoustic windows even with contrast agent. A Lexiscan Myoview showed a small severe apical inferior and apical defect that could not be ruled out as scar or ischemia. Left ventricular ejection fraction was mildly decreased at 45% with apical and apical inferior hypokinesis. Cardiac catheterization 07/13/2020 showed severe single-vessel coronary disease with 70% ostial LAD stenosis and 90% mid LAD stenosis with a heavily calcified proximal and mid LAD. This was a large vessel that wraps around the apex. There is no significant disease in the left circumflex or right coronary arteries. Left and right heart pressures are normal.  She is here today with her husband. She uses a rolling walker to ambulate due to arthritis, generalized weakness, and shortness of breath.  She has had some substernal chest discomfort with exertion.  She denies any dizziness or syncope.  She has had no orthopnea.  Past Medical History:  Diagnosis Date   Anxiety    Bipolar disorder ([place])    has stopped seeing psychiatrist   Cataract    Chronic  bronchitis    COPD (chronic obstructive pulmonary disease) ([place])    Coronary artery disease, non-occlusive    Depression    Dyspnea 2012   s/p pulm/cards w/u WNL, thought due to obesity/deconditioning   Fibromyalgia    GERD (gastroesophageal reflux disease)    History of chicken pox    HLD (hyperlipidemia)    Hypertension    Hypothyroidism    Migraine    OSA (obstructive sleep apnea) 03/16/2011   Sleep apnea    T2DM (type 2 diabetes mellitus) ([place]) 2012   DM education 06/2011   Urinary incontinence     Past Surgical History:  Procedure Laterality Date   BREAST CYST ASPIRATION Right 2003   cardiac catherization  03/2011   x3 Glennie Hawk), mild nonobstructive CAD   COLONOSCOPY  12/2011   hyperplastic polyp x2,. diverticulosis, rec rpt 10 yrs   ESOPHAGOGASTRODUODENOSCOPY  2006   KNEE SURGERY  2006   left, torn meniscus   RIGHT/LEFT HEART CATH AND CORONARY ANGIOGRAPHY N/A 07/13/2020   Procedure: RIGHT/LEFT HEART CATH AND CORONARY ANGIOGRAPHY;  Surgeon: Nelva Bush, MD;  Location: [facility];  Service: Cardiovascular;  Laterality: N/A;   TOTAL ABDOMINAL HYSTERECTOMY  2004   fibroids, cervix remained   TOTAL KNEE ARTHROPLASTY Left 08/2012   [name], [facility]    Family History  Problem Relation Age of Onset   Asthma Maternal Uncle    Cancer Maternal Uncle        colon   Aneurysm Maternal Uncle    Colon cancer Maternal Uncle 50   Hypertension Mother    Stroke Mother    Arthritis Mother  COPD Sister    Acute lymphoblastic leukemia Cousin 3       died at age 24   Cancer Father 8       brain tumor   Diabetes Father    Thyroid disease Father    Cancer Maternal Aunt 47       breast   Breast cancer Maternal Aunt    Cancer Paternal Uncle        prostate   Coronary artery disease Neg Hx    Stomach cancer Neg Hx    Rectal cancer Neg Hx    Esophageal cancer Neg Hx     Social History Social History   Tobacco  Use   Smoking status: Former Smoker    Packs/day: 2.00    Years: 43.00    Pack years: 86.00    Types: Cigarettes    Quit date: 05/17/2010    Years since quitting: 10.1   Smokeless tobacco: Never Used  Vaping Use   Vaping Use: Never used  Substance Use Topics   Alcohol use: No    Alcohol/week: 0.0 standard drinks   Drug use: No    Current Outpatient Medications  Medication Sig Dispense Refill   acetaminophen (TYLENOL) 650 MG CR tablet Take 1,300 mg by mouth in the morning and at bedtime.     albuterol (VENTOLIN HFA) 108 (90 Base) MCG/ACT inhaler Inhale 2 puffs into the lungs every 6 (six) hours as needed for wheezing or shortness of breath. 8 g 3   APPLE CIDER VINEGAR PO Take 1 tablet by mouth in the morning and at bedtime.      aspirin EC 81 MG tablet Take 81 mg by mouth every evening.      Boswellia-Glucosamine-Vit D (OSTEO BI-FLEX ONE PER DAY PO) Take 1 tablet by mouth daily.      butalbital-acetaminophen-caffeine (FIORICET) 50-325-40 MG tablet Take 1 tablet by mouth 2 (two) times daily as needed for headache or migraine. 20 tablet 1   carvedilol (COREG) 3.125 MG tablet TAKE 1 TABLET TWICE A DAY  WITH MEALS (Patient taking differently: Take 3.125 mg by mouth in the morning and at bedtime. ) 180 tablet 0   famotidine (PEPCID) 20 MG tablet Take 20 mg by mouth every other day. Alternates with omeprazole     glimepiride (AMARYL) 1 MG tablet Take 1 tablet (1 mg total) by mouth daily with breakfast. 90 tablet 1   hydrochlorothiazide (HYDRODIURIL) 25 MG tablet TAKE 1 TABLET DAILY (Patient taking differently: Take 25 mg by mouth daily. ) 90 tablet 0   INS SYRINGE/NEEDLE 1CC/28G (B-D INS SYR MICROFINE 1CC/28G) 28G X 1/2" 1 ML MISC USE AS DIRECTED 100 each 3   levothyroxine (SYNTHROID) 100 MCG tablet TAKE 1 TABLET DAILY, AND ON2 DAYS A WEEK, TAKE AN     EXTRA 1/2 TABLET (Patient taking differently: Take 100-150 mcg by mouth See admin instructions. Take 1.5 tablets (150 mcg) by  mouth on Mondays & Fridays before breakfast. Take 1 tablet (100 mcg) by mouth on Sundays, Tuesdays, Wednesdays, Thursdays & Saturdays before breakfast) 90 tablet 0   lisinopril (ZESTRIL) 5 MG tablet TAKE 1 TABLET DAILY 90 tablet 3   loratadine (CLARITIN) 10 MG tablet Take 1 tablet (10 mg total) by mouth daily.     metFORMIN (GLUCOPHAGE) 500 MG tablet Take 2 tablets (1,000 mg total) by mouth in the morning and at bedtime.     Multiple Vitamin (MULTIVITAMIN WITH MINERALS) TABS tablet Take 1 tablet by mouth daily. One A  Day for Women     NOVOLIN N 100 UNIT/ML injection INJECT 55 UNITS            SUBCUTANEOUSLY AT BEDTIME (Patient taking differently: Inject 40 Units into the skin daily after supper. ) 36 mL 3   nystatin cream (MYCOSTATIN) APPLY TO AFFECTED AREA TWICE A DAY 30 g 1   omeprazole (PRILOSEC) 20 MG capsule TAKE 1 DAILY-ALTERNATES WITH FAMOTIDINE (Patient taking differently: Take 20 mg by mouth every other day. Take 1 daily-Alternates with famotidine) 90 capsule 0   ONE TOUCH LANCETS MISC Use to check sugar twice daily Dx: E11.8 200 each 3   ONETOUCH ULTRA test strip USE TO CHECK BLOOD SUGAR 2 TIMES   DAILY 200 strip 3   pioglitazone (ACTOS) 15 MG tablet TAKE 1 TABLET DAILY (Patient taking differently: Take 15 mg by mouth daily. ) 90 tablet 0   Rhubarb (ESTROVEN COMPLETE PO) Take 1 tablet by mouth daily.     simvastatin (ZOCOR) 40 MG tablet TAKE 1 TABLET AT BEDTIME (Patient taking differently: Take 40 mg by mouth daily after supper. ) 90 tablet 3   topiramate (TOPAMAX) 100 MG tablet Take 1 tablet (100 mg total) by mouth at bedtime. (Patient taking differently: Take 100 mg by mouth at bedtime. ) 90 tablet 1   umeclidinium-vilanterol (ANORO ELLIPTA) 62.5-25 MCG/INH AEPB Inhale 1 puff into the lungs daily. 60 each 5   ziprasidone (GEODON) 40 MG capsule TAKE 1 CAPSULE TWICE DAILY WITH MEALS (Patient taking differently: Take 40 mg by mouth in the morning and at bedtime. ) 180 capsule  1   No current facility-administered medications for this visit.    Allergies  Allergen Reactions   Crestor [Rosuvastatin] Other (See Comments)    myalgias   Lipitor [Atorvastatin] Other (See Comments)    Mood swings   Lithium Other (See Comments)    abd pain, n/v, was hospitalized   Quetiapine Other (See Comments)    Muscle twitching, muscle spasm    Review of Systems  Constitutional: Positive for appetite change and fatigue.  HENT: Negative.   Eyes: Negative.   Respiratory: Positive for shortness of breath.   Cardiovascular: Positive for chest pain. Negative for leg swelling.  Gastrointestinal: Positive for diarrhea.  Endocrine: Negative.   Genitourinary: Negative.   Musculoskeletal: Positive for arthralgias and gait problem.  Skin: Negative.   Allergic/Immunologic: Negative.   Neurological: Positive for dizziness, numbness and headaches.       Chronic pain  Psychiatric/Behavioral:       Bipolar    BP 100/64 (BP Location: Right Arm, Patient Position: Sitting)    Pulse 93    Resp 18    Ht 5\' 2"  (1.575 m)    Wt 237 lb (107.5 kg)    SpO2 96% Comment: RA   BMI 43.35 kg/m  Physical Exam HENT:     Head: Normocephalic and atraumatic.  Eyes:     Extraocular Movements: Extraocular movements intact.     Pupils: Pupils are equal, round, and reactive to light.  Cardiovascular:     Rate and Rhythm: Normal rate and regular rhythm.     Pulses: Normal pulses.     Heart sounds: Normal heart sounds. No murmur heard.   Pulmonary:     Effort: Pulmonary effort is normal.     Breath sounds: Normal breath sounds.  Abdominal:     Tenderness: There is no abdominal tenderness.  Musculoskeletal:        General: No swelling.  Skin:  General: Skin is dry.  Neurological:     General: No focal deficit present.     Mental Status: She is oriented to person, place, and time.  Psychiatric:        Mood and Affect: Mood normal.        Behavior: Behavior normal.        Thought  Content: Thought content normal.        Judgment: Judgment normal.      Diagnostic Tests:  Physicians  Panel Physicians Referring Physician Case Authorizing Physician  End, Harrell Gave, MD (Primary)    Procedures  RIGHT/LEFT HEART CATH AND CORONARY ANGIOGRAPHY  Conclusion  Conclusions: 1. Severe single-vessel coronary artery disease with sequential 70% ostial and 90% mid LAD stenoses. The proximal and mid LAD is heavily calcified. 2. No significant CAD involving the LCx and RCA. 3. Normal left and right heart filling pressures. 4. Low normal to mildly reduced Fick cardiac output/index.  Recommendations: 1. Refer to cardiac surgery for evaluation for CABG. Ostial LAD disease is not optimal for stenting, as it could jeopardize the LCx and high diagonal branches. If the patient is not a surgical candidate, high risk PCI with atherectomy could be considered. 2. Aggressive secondary prevention of coronary artery disease. 3. Consider alternative modality for assessing left ventricular systolic function, given poor visualization of the left ventricle by echo and catheterization.  Nelva Bush, MD [facility]  Recommendations  Antiplatelet/Anticoag Recommend Aspirin 81mg  daily for moderate CAD. Defer adding P2Y12 inhibitor pending cardiac surgery evaluation.  Discharge Date In the absence of any other complications or medical issues, we expect the patient to be ready for discharge from a cath perspective on 07/13/2020.  Indications  Abnormal cardiovascular stress test [R94.39 (ICD-10-CM)]  Dyspnea on exertion [R06.00 (ICD-10-CM)]  Procedural Details  Technical Details Indication: 69 y.o. year-old woman with history of hypertension, hyperlipidemia, diabetes, former smoker x40 years, COPD, OSA, presenting for evaluation of shortness of breath and abnormal stress test.  GFR: >60 ml/min  Procedure: The risks, benefits, complications, treatment options, and expected outcomes  were discussed with the patient. The patient and/or family concurred with the proposed plan, giving informed consent. The patient was sedated with IV midazolam and fentanyl. The right wrist and elbow were prepped and draped in a sterile fashion. 1% lidocaine was used for local anesthesia. A previously placed antecubital vein IV was exchanged for a 27F slender Glidesheath using modified Seldinger technique. Right heart catheterization was performed by advancing a 27F balloon-tipped catheter through the right heart chambers into the pulmonary capillary wedge position. Pressure measurements and oxygen saturations were obtained.  Ultrasound was used to evaluate the right radial artery. It was patent.  A micropuncture needle was used to access the renal artery under ultrasound guidance.  Using the modified Seldinger access technique, a 38F slender Glidesheath was placed in the right radial artery. 3 mg Verapamil was given through the sheath. Heparin 5,000 units were administered.  Selective coronary angiography was performed using a 27F TIG4.0 catheter to engage the left coronary artery and a 27F TIG4.0 catheter to engage the right coronary artery. Left heart catheterization was performed using a 27F TIG4.0 catheter. Left ventriculogram was performed not performed, as the JR4 catheter could not be adequately positioned for hand injection ventriculography. The aortic valve could also not be crossed with an angled pigtail catheter.  At the end of the procedure, the radial artery sheath was removed and a TR band applied to achieve patent hemostasis. The antecubital vein sheath  was removed and hemostasis achieved with manual compression. There were no immediate complications. The patient was taken to the recovery area in stable condition.  Estimated blood loss <50 mL.   During this procedure medications were administered to achieve and maintain moderate conscious sedation while the patient's heart rate, blood pressure,  and oxygen saturation were continuously monitored and I was present face-to-face 100% of this time.  Medications (Filter: Administrations occurring from 6195 to 0844 on 07/13/20) (important) Continuous medications are totaled by the amount administered until 07/13/20 0844.  fentaNYL (SUBLIMAZE) injection (mcg) Total dose:  12.5 mcg Date/Time  Rate/Dose/Volume Action  07/13/20 0753  12.5 mcg Given    midazolam (VERSED) injection (mg) Total dose:  0.5 mg Date/Time  Rate/Dose/Volume Action  07/13/20 0754  0.5 mg Given    Heparin (Porcine) in NaCl 1000-0.9 UT/500ML-% SOLN (mL) Total volume:  500 mL Date/Time  Rate/Dose/Volume Action  07/13/20 0754  500 mL Given    heparin sodium (porcine) injection (Units) Total dose:  5,000 Units Date/Time  Rate/Dose/Volume Action  07/13/20 0810  5,000 Units Given    verapamil (ISOPTIN) injection (mg) Total dose:  2.5 mg Date/Time  Rate/Dose/Volume Action  07/13/20 0806  2.5 mg Given    iohexol (OMNIPAQUE) 300 MG/ML solution (mL) Total volume:  80 mL Date/Time  Rate/Dose/Volume Action  07/13/20 0839  80 mL Given    lidocaine (PF) (XYLOCAINE) 1 % injection (mL) Total volume:  2 mL Date/Time  Rate/Dose/Volume Action  07/13/20 0835  2 mL Given    0.9 % sodium chloride infusion (mL/hr) Total volume:  12.94 mL Dosing weight:  112.2 Date/Time  Rate/Dose/Volume Action  07/13/20 0727  10 mL - 10 mL/hr New Bag/Given    sodium chloride flush (NS) 0.9 % injection 3 mL (mL) Total volume:  3 mL Dosing weight:  112.2 Date/Time  Rate/Dose/Volume Action  07/13/20 0727  3 mL Given    Sedation Time  Sedation Time Physician-1: 43 minutes 2 seconds  Contrast  Medication Name Total Dose  iohexol (OMNIPAQUE) 300 MG/ML solution 80 mL    Radiation/Fluoro  Fluoro time: 13.3 (min) DAP: 48.4 (Gycm2) Cumulative Air Kerma: 093 (mGy)  Complications  Complications documented before study signed (07/13/2020 2:67 AM)   No complications were  associated with this study.  Documented by Nelva Bush, MD - 07/13/2020 9:09 AM    Coronary Findings  Diagnostic Dominance: Right Left Main  Vessel is large.  Mid LM lesion is 20% stenosed.  Left Anterior Descending  The vessel is severely calcified.  Ost LAD lesion is 70% stenosed. The lesion is eccentric.  Mid LAD lesion is 90% stenosed. The lesion is eccentric. The lesion is severely calcified.  First Diagonal Branch  Vessel is small in size.  Second Diagonal Branch  Vessel is small in size.  Third Diagonal Branch  Vessel is small in size.  Left Circumflex  Vessel is large. Vessel is angiographically normal.  First Obtuse Marginal Branch  Vessel is small in size.  Second Obtuse Marginal Branch  Vessel is large in size.  Third Obtuse Marginal Branch  Vessel is small in size.  Right Coronary Artery  Vessel is moderate in size. Vessel is angiographically normal.  Right Posterior Descending Artery  Vessel is small in size.  Right Posterior Atrioventricular Artery  Vessel is small in size.  Intervention  No interventions have been documented. Right Heart  Right Heart Pressures RA (mean): 5 mmHg RV (S/EDP): 30/5 mmHg PA (S/D, mean): 29/11 mmHg PCWP (mean):  9 mmHg  Ao sat: 100% PA sat: 76%  Fick CO: 4.9 L/min Fick CI: 2.3 L/min/m^2  Left Heart  Left Ventricle LV end diastolic pressure is normal. LVEDP 10 mmHg.  Aortic Valve There is no aortic valve stenosis.  Coronary Diagrams  Diagnostic Dominance: Right  Intervention  Implants   No implant documentation for this case.  Syngo Images  Show images for CARDIAC CATHETERIZATION Images on Long Term Storage  Show images for [place], [place] to Procedure Log  Procedure Log    Hemo Data (last day) before discharge   AO Systolic Cath Pressure  AO Diastolic Cath Pressure  AO Mean Cath Pressure  LV Systolic Cath Pressure  LV End Diastolic  LV Systolic  LV End Diastolic  LV dP/dt  PA Systolic  Cath Pressure  PA Diastolic Cath Pressure  PA Mean Cath Pressure  RA Wedge A Wave  RA Wedge V Wave  RV Systolic Cath Pressure  RV Diastolic Cath Pressure  RV End Diastolic  RV Systolic  RV End Diastolic  RV dP/dt  PCW A Wave  PCW V Wave  PCW Mean  AO O2 Sat  PA O2 Sat  AO O2 Sat  Fick C.O.  Fick C.I.   --  --  --  --  --  141 mmHg  11 mmHg  1392 mmHg/sec  --  --  --  6 mmHg  2 mmHg  --  --  --  25 mmHg  2 mmHg  384 mmHg/sec  7 mmHg  4 mmHg  2 mmHg  --  --  --  4.89 L/min  2.34 L/min/m2   --  --  --  --  --  --  --  --  --  --  --  --  --  25 mmHg  -3 mmHg  2 mmHg  --  --  --  --  --  --  --  --  --  --  --   --  --  --  --  --  --  --  --  25 mmHg  9 mmHg  15 mmHg  --  --  --  --  --  --  --  --  --  --  --  --  --  --  --  --   136  70 mmHg  88 mmHg  --  --  --  --  --  --  --  --  --  --  --  --  --  --  --  --  --  --  --  --  --  --  --  --   --  --  --  --  --  --  --  --  --  --  --  --  --  --  --  --  --  --  --  --  --  --  100 %  --  SA  --  --   --  --  --  --  --  --  --  --  --  --  --  --  --  --  --  --  --  --  --  --  --  --  --  PA  --  --  --   --  --  --  133 mmHg  10 mmHg  --  --  --  --  --  --  --  --  --  --  --  --  --  --  --  --  --  --  --  --  --  --   --  --  --  146 mmHg  6 mmHg  --  --  --  --  --  --  --  --  --  --  --  --  --  --  --  --  --  --  --  --  --  --   --  --  --  141 mmHg  11 mmHg  --  --  --  --  --  --  --  --  --  --  --  --  --  --  --  --  --  --  --  --  --  --   144  72 mmHg  103 mmHg  --  --  --  --  --  --  --  --  --  --                                Impression:  This 69 year old woman has progressive exertional fatigue, shortness of breath, and substernal chest discomfort with high-grade sequential LAD stenosis.  There is a large vessel that wraps around the apex.  She has an abnormal nuclear scan showing apical inferior and apical scar or ischemia and I suspect that her symptoms are related to her LAD stenosis.  She does have a long history  of heavy smoking but PFTs are showing no significant obstructive or restrictive disease and her arterial blood gas is unremarkable.  Her proximal and mid LAD are heavily calcified and I agree that coronary artery bypass graft surgery is the best treatment to revascularize her heart. I discussed the operative procedure with the patient and and her husband including alternatives, benefits and risks; including but not limited to bleeding, blood transfusion, infection, stroke, myocardial infarction, graft failure, heart block requiring a permanent pacemaker, organ dysfunction, and death.  Talana Cherlynn June understands and agrees to proceed.   Plan:  She will be scheduled for coronary bypass graft surgery on Tuesday 08/03/2020.  I spent 60 minutes performing this consultation and > 50% of this time was spent face to face counseling and coordinating the care of this patient's severe single vessel coronary disease.  Gaye Pollack, MD [facility] [phone]

## 2020-07-22 ENCOUNTER — Encounter: Payer: Self-pay | Admitting: *Deleted

## 2020-07-23 ENCOUNTER — Other Ambulatory Visit: Payer: Self-pay | Admitting: Family Medicine

## 2020-07-29 NOTE — Progress Notes (Signed)
Your procedure is scheduled on Tuesday, December 14th.  Report to [facility] Main Entrance "A" at 5:30 A.M., and check in at the Admitting office.  Call this number if you have problems the morning of surgery:  [phone]  Call [phone] if you have any questions prior to your surgery date Monday-Friday 8am-4pm   Remember:  Do not eat or drink after midnight the night before your surgery    Take these medicines the morning of surgery with A SIP OF WATER  acetaminophen (TYLENOL) carvedilol (COREG)  famotidine (PEPCID)  levothyroxine (SYNTHROID)  omeprazole (PRILOSEC)  ziprasidone (GEODON)  If needed: albuterol (VENTOLIN HFA)/inhaler - bring with you the morning of surgery   Follow your surgeon's instructions on when to stop Aspirin.  If no instructions were given by your surgeon then you will need to call the office to get those instructions.    As of today, STOP taking Aleve, Naproxen, Ibuprofen, Motrin, Advil, Goody's, BC's, all herbal medications, fish oil, and all vitamins.                     Do not wear jewelry, make up, or nail polish            Do not wear lotions, powders, perfumes, or deodorant.            Do not shave 48 hours prior to surgery.              Do not bring valuables to the hospital.            [facility] is not responsible for any belongings or valuables.  Do NOT Smoke (Tobacco/Vaping) or drink Alcohol 24 hours prior to your procedure If you use a CPAP at night, you may bring all equipment for your overnight stay.   Contacts, glasses, dentures or bridgework may not be worn into surgery.      For patients admitted to the hospital, discharge time will be determined by your treatment team.   Patients discharged the day of surgery will not be allowed to drive home, and someone needs to stay with them for 24 hours.  Special instructions:   [facility]- Preparing For Surgery  Before surgery, you can play an important role. Because skin is not  sterile, your skin needs to be as free of germs as possible. You can reduce the number of germs on your skin by washing with CHG (chlorahexidine gluconate) Soap before surgery.  CHG is an antiseptic cleaner which kills germs and bonds with the skin to continue killing germs even after washing.    Oral Hygiene is also important to reduce your risk of infection.  Remember - BRUSH YOUR TEETH THE MORNING OF SURGERY WITH YOUR REGULAR TOOTHPASTE  Please do not use if you have an allergy to CHG or antibacterial soaps. If your skin becomes reddened/irritated stop using the CHG.  Do not shave (including legs and underarms) for at least 48 hours prior to first CHG shower. It is OK to shave your face.  Please follow these instructions carefully.   1. Shower the NIGHT BEFORE SURGERY and the MORNING OF SURGERY with CHG Soap.   2. If you chose to wash your hair, wash your hair first as usual with your normal shampoo.  3. After you shampoo, rinse your hair and body thoroughly to remove the shampoo.  4. Use CHG as you would any other liquid soap. You can apply CHG directly to the skin and wash gently  with a scrungie or a clean washcloth.   5. Apply the CHG Soap to your body ONLY FROM THE NECK DOWN.  Do not use on open wounds or open sores. Avoid contact with your eyes, ears, mouth and genitals (private parts). Wash Face and genitals (private parts)  with your normal soap.   6. Wash thoroughly, paying special attention to the area where your surgery will be performed.  7. Thoroughly rinse your body with warm water from the neck down.  8. DO NOT shower/wash with your normal soap after using and rinsing off the CHG Soap.  9. Pat yourself dry with a CLEAN TOWEL.  10. Wear CLEAN PAJAMAS to bed the night before surgery  11. Place CLEAN SHEETS on your bed the night of your first shower and DO NOT SLEEP WITH PETS.  Day of Surgery: Wear Clean/Comfortable clothing the morning of surgery Do not apply any  deodorants/lotions.   Remember to brush your teeth WITH YOUR REGULAR TOOTHPASTE.   Please read over the following fact sheets that you were given.

## 2020-07-30 ENCOUNTER — Other Ambulatory Visit: Payer: Self-pay | Admitting: *Deleted

## 2020-07-30 ENCOUNTER — Encounter (HOSPITAL_COMMUNITY)
Admission: RE | Admit: 2020-07-30 | Discharge: 2020-07-30 | Disposition: A | Discharge status: Home / Self Care | Payer: Medicare HMO | Source: Ambulatory Visit | Attending: Surgery | Admitting: Surgery

## 2020-07-30 ENCOUNTER — Ambulatory Visit (HOSPITAL_COMMUNITY)
Admission: RE | Admit: 2020-07-30 | Discharge: 2020-07-30 | Disposition: A | Discharge status: Home / Self Care | Payer: Medicare HMO | Source: Ambulatory Visit | Attending: Surgery | Admitting: Surgery

## 2020-07-30 ENCOUNTER — Ambulatory Visit: Payer: Medicare HMO | Admitting: Cardiology

## 2020-07-30 ENCOUNTER — Other Ambulatory Visit
Admission: RE | Admit: 2020-07-30 | Discharge: 2020-07-30 | Disposition: A | Discharge status: Home / Self Care | Payer: Medicare HMO | Source: Ambulatory Visit | Attending: Surgery | Admitting: Surgery

## 2020-07-30 ENCOUNTER — Encounter (HOSPITAL_COMMUNITY): Payer: Self-pay

## 2020-07-30 ENCOUNTER — Other Ambulatory Visit: Payer: Self-pay

## 2020-07-30 DIAGNOSIS — I2510 Atherosclerotic heart disease of native coronary artery without angina pectoris: Secondary | ICD-10-CM

## 2020-07-30 DIAGNOSIS — Z20822 Contact with and (suspected) exposure to covid-19: Secondary | ICD-10-CM | POA: Insufficient documentation

## 2020-07-30 DIAGNOSIS — E119 Type 2 diabetes mellitus without complications: Secondary | ICD-10-CM | POA: Diagnosis not present

## 2020-07-30 DIAGNOSIS — Z87891 Personal history of nicotine dependence: Secondary | ICD-10-CM | POA: Diagnosis not present

## 2020-07-30 DIAGNOSIS — Z7984 Long term (current) use of oral hypoglycemic drugs: Secondary | ICD-10-CM | POA: Insufficient documentation

## 2020-07-30 DIAGNOSIS — Z01818 Encounter for other preprocedural examination: Secondary | ICD-10-CM | POA: Diagnosis not present

## 2020-07-30 DIAGNOSIS — J449 Chronic obstructive pulmonary disease, unspecified: Secondary | ICD-10-CM | POA: Diagnosis not present

## 2020-07-30 DIAGNOSIS — Z01812 Encounter for preprocedural laboratory examination: Secondary | ICD-10-CM | POA: Insufficient documentation

## 2020-07-30 DIAGNOSIS — I10 Essential (primary) hypertension: Secondary | ICD-10-CM | POA: Diagnosis not present

## 2020-07-30 DIAGNOSIS — Z79899 Other long term (current) drug therapy: Secondary | ICD-10-CM | POA: Diagnosis not present

## 2020-07-30 DIAGNOSIS — G4733 Obstructive sleep apnea (adult) (pediatric): Secondary | ICD-10-CM | POA: Diagnosis not present

## 2020-07-30 DIAGNOSIS — E039 Hypothyroidism, unspecified: Secondary | ICD-10-CM | POA: Diagnosis not present

## 2020-07-30 HISTORY — DX: Unspecified osteoarthritis, unspecified site: M19.90

## 2020-07-30 HISTORY — DX: Pneumonia, unspecified organism: J18.9

## 2020-07-30 LAB — COMPREHENSIVE METABOLIC PANEL
ALT: 20 U/L (ref 0–44)
AST: 20 U/L (ref 15–41)
Albumin: 3.9 g/dL (ref 3.5–5.0)
Alkaline Phosphatase: 41 U/L (ref 38–126)
Anion gap: 18 — ABNORMAL HIGH (ref 5–15)
BUN: 10 mg/dL (ref 8–23)
CO2: 20 mmol/L — ABNORMAL LOW (ref 22–32)
Calcium: 9.6 mg/dL (ref 8.9–10.3)
Chloride: 87 mmol/L — ABNORMAL LOW (ref 98–111)
Creatinine, Ser: 0.91 mg/dL (ref 0.44–1.00)
GFR, Estimated: >60 mL/min (ref >60)
Glucose, Bld: 98 mg/dL (ref 70–99)
Potassium: 3 mmol/L — ABNORMAL LOW (ref 3.5–5.1)
Sodium: 125 mmol/L — ABNORMAL LOW (ref 135–145)
Total Bilirubin: 0.7 mg/dL (ref 0.3–1.2)
Total Protein: 7.5 g/dL (ref 6.5–8.1)

## 2020-07-30 LAB — CBC
HCT: 35.6 % — ABNORMAL LOW (ref 36.0–46.0)
Hemoglobin: 12.2 g/dL (ref 12.0–15.0)
MCH: 30.7 pg (ref 26.0–34.0)
MCHC: 34.3 g/dL (ref 30.0–36.0)
MCV: 89.7 fL (ref 80.0–100.0)
Platelets: 453 10*3/uL — ABNORMAL HIGH (ref 150–400)
RBC: 3.97 MIL/uL (ref 3.87–5.11)
RDW: 12.5 % (ref 11.5–15.5)
WBC: 16 10*3/uL — ABNORMAL HIGH (ref 4.0–10.5)
nRBC: 0 % (ref 0.0–0.2)

## 2020-07-30 LAB — URINALYSIS, ROUTINE W REFLEX MICROSCOPIC
Bilirubin Urine: NEGATIVE
Glucose, UA: NEGATIVE mg/dL
Hgb urine dipstick: NEGATIVE
Ketones, ur: NEGATIVE mg/dL
Nitrite: NEGATIVE
Protein, ur: NEGATIVE mg/dL
Renal Epithelial: <1
Specific Gravity, Urine: 1.011 (ref 1.005–1.030)
pH: 5 (ref 5.0–8.0)

## 2020-07-30 LAB — GLUCOSE, CAPILLARY
Glucose-Capillary: 81 mg/dL (ref 70–99)
Glucose-Capillary: 96 mg/dL (ref 70–99)

## 2020-07-30 LAB — SURGICAL PCR SCREEN
MRSA, PCR: NEGATIVE
Staphylococcus aureus: NEGATIVE

## 2020-07-30 LAB — TYPE AND SCREEN
ABO/RH(D): B POS
Antibody Screen: NEGATIVE

## 2020-07-30 LAB — HEMOGLOBIN A1C
Hgb A1c MFr Bld: 5.9 % — ABNORMAL HIGH (ref 4.8–5.6)
Mean Plasma Glucose: 122.63 mg/dL

## 2020-07-30 LAB — PROTIME-INR
INR: 1 (ref 0.8–1.2)
Prothrombin Time: 12.8 seconds (ref 11.4–15.2)

## 2020-07-30 LAB — APTT: aPTT: 35 seconds (ref 24–36)

## 2020-07-30 NOTE — Progress Notes (Signed)
Abnormal labs have resulted, [name] of [facility]'s office made aware via telephone.

## 2020-07-30 NOTE — Progress Notes (Signed)
Pre CABG       has been completed. Preliminary results can be found under CV proc through chart review. June Leap, BS, RDMS, RVT

## 2020-07-30 NOTE — Progress Notes (Signed)
PCP - Dr. Thedora Hinders Cardiologist - Dr. Kate Sable Pulmonologist - Vernard Gambles  PPM/ICD - denies  Chest x-ray - 07/30/2020 EKG - 07/02/2020 Stress Test - 06/29/2020 ECHO - 06/15/2020 Cardiac Cath - 07/13/2020  Sleep Study - per patient, does have OSA but does not wear CPAP, unable to tolerate it  Fasting Blood Sugar - averages 115-120  Checks Blood Sugar 2 times a day   Blood Thinner Instructions:n N/A Aspirin Instructions: continue taking as perscribed  ERAS Protcol - No  COVID TEST- 07/30/2020, test results pending. Patient verbalized understanding of self-quarantine instructions.  Anesthesia review: YES, CAD, O2 2L at night Patient CBG on arrival to PAT appointment was 81, patient stated that is too low for her and that is why she is feeling weak. Patient stated she did eat breakfast earlier this morning not sure why CBG was only 81. Patient given diet cola and crackers. Patient stated she felt much better after the snack. Per patient when her sugars are 100 or less she starts to feel weak and dizzy and takes a glucose tablet which resolves her symptoms. CBG rechecked prior to leaving PAT: 96. Patient stated she feels fine.  Also, after providing urine sample and changing into gown, patient complained of feeling very short of breath. VS: O2 100 HR 92. After a few minutes of resting patient stated she felt much better and then proceeded to get ordered labs and X-ray for surgery.  Patient denies , fever, cough and chest pain at PAT appointment  All instructions explained to the patient, with a verbal understanding of the material. Patient agrees to go over the instructions while at home for a better understanding. Patient also instructed to self quarantine after being tested for COVID-19. The opportunity to ask questions was provided.

## 2020-07-30 NOTE — Progress Notes (Signed)
WHAT DO I DO ABOUT MY DIABETES MEDICATION?  Marland Kitchen The night before surgery take 20 units of NOVOLIN N.  .  . Do not take glimepiride (AMARYL), metFORMIN (GLUCOPHAGE), and pioglitazone (ACTOS)  oral diabetes medicines (pills) the morning of surgery.  HOW TO MANAGE YOUR DIABETES BEFORE AND AFTER SURGERY  Why is it important to control my blood sugar before and after surgery? . Improving blood sugar levels before and after surgery helps healing and can limit problems. . A way of improving blood sugar control is eating a healthy diet by: o  Eating less sugar and carbohydrates o  Increasing activity/exercise o  Talking with your doctor about reaching your blood sugar goals . High blood sugars (greater than 180 mg/dL) can raise your risk of infections and slow your recovery, so you will need to focus on controlling your diabetes during the weeks before surgery. . Make sure that the doctor who takes care of your diabetes knows about your planned surgery including the date and location.  How do I manage my blood sugar before surgery? . Check your blood sugar at least 4 times a day, starting 2 days before surgery, to make sure that the level is not too high or low. . Check your blood sugar the morning of your surgery when you wake up and every 2 hours until you get to the Short Stay unit. o If your blood sugar is less than 70 mg/dL, you will need to treat for low blood sugar: - Do not take insulin. - Treat a low blood sugar (less than 70 mg/dL) with  cup of clear juice (cranberry or apple), 4 glucose tablets, OR glucose gel. - Recheck blood sugar in 15 minutes after treatment (to make sure it is greater than 70 mg/dL). If your blood sugar is not greater than 70 mg/dL on recheck, call [phone] for further instructions. . Report your blood sugar to the short stay nurse when you get to Short Stay.  . If you are admitted to the hospital after surgery: o Your blood sugar will be checked by the staff  and you will probably be given insulin after surgery (instead of oral diabetes medicines) to make sure you have good blood sugar levels. o The goal for blood sugar control after surgery is 80-180 mg/dL.

## 2020-07-31 LAB — SARS CORONAVIRUS 2 (TAT 6-24 HRS): SARS Coronavirus 2: NEGATIVE

## 2020-08-01 ENCOUNTER — Other Ambulatory Visit: Payer: Self-pay | Admitting: Family Medicine

## 2020-08-02 ENCOUNTER — Encounter (HOSPITAL_COMMUNITY)
Admission: RE | Admit: 2020-08-02 | Discharge: 2020-08-02 | Disposition: A | Discharge status: Home / Self Care | Payer: Medicare HMO | Source: Ambulatory Visit | Attending: Surgery | Admitting: Surgery

## 2020-08-02 ENCOUNTER — Other Ambulatory Visit: Payer: Self-pay

## 2020-08-02 ENCOUNTER — Encounter (HOSPITAL_COMMUNITY): Payer: Self-pay | Admitting: Surgery

## 2020-08-02 DIAGNOSIS — I2510 Atherosclerotic heart disease of native coronary artery without angina pectoris: Secondary | ICD-10-CM | POA: Insufficient documentation

## 2020-08-02 DIAGNOSIS — Z01812 Encounter for preprocedural laboratory examination: Secondary | ICD-10-CM | POA: Insufficient documentation

## 2020-08-02 LAB — CBC
HCT: 35.4 % — ABNORMAL LOW (ref 36.0–46.0)
Hemoglobin: 12 g/dL (ref 12.0–15.0)
MCH: 30.5 pg (ref 26.0–34.0)
MCHC: 33.9 g/dL (ref 30.0–36.0)
MCV: 90.1 fL (ref 80.0–100.0)
Platelets: 451 10*3/uL — ABNORMAL HIGH (ref 150–400)
RBC: 3.93 MIL/uL (ref 3.87–5.11)
RDW: 12.9 % (ref 11.5–15.5)
WBC: 15.5 10*3/uL — ABNORMAL HIGH (ref 4.0–10.5)
nRBC: 0 % (ref 0.0–0.2)

## 2020-08-02 LAB — COMPREHENSIVE METABOLIC PANEL
ALT: 20 U/L (ref 0–44)
AST: 20 U/L (ref 15–41)
Albumin: 3.8 g/dL (ref 3.5–5.0)
Alkaline Phosphatase: 40 U/L (ref 38–126)
Anion gap: 14 (ref 5–15)
BUN: 8 mg/dL (ref 8–23)
CO2: 26 mmol/L (ref 22–32)
Calcium: 9.6 mg/dL (ref 8.9–10.3)
Chloride: 90 mmol/L — ABNORMAL LOW (ref 98–111)
Creatinine, Ser: 0.78 mg/dL (ref 0.44–1.00)
GFR, Estimated: >60 mL/min (ref >60)
Glucose, Bld: 114 mg/dL — ABNORMAL HIGH (ref 70–99)
Potassium: 3.2 mmol/L — ABNORMAL LOW (ref 3.5–5.1)
Sodium: 130 mmol/L — ABNORMAL LOW (ref 135–145)
Total Bilirubin: 0.6 mg/dL (ref 0.3–1.2)
Total Protein: 7.3 g/dL (ref 6.5–8.1)

## 2020-08-02 MED ORDER — DEXMEDETOMIDINE HCL IN NACL 400 MCG/100ML IV SOLN
0.1000 ug/kg/h | INTRAVENOUS | Status: AC
  Administered 2020-08-03: .7 ug/kg/h via INTRAVENOUS
  Filled 2020-08-02: qty 100

## 2020-08-02 MED ORDER — PHENYLEPHRINE HCL-NACL 20-0.9 MG/250ML-% IV SOLN
30.0000 ug/min | INTRAVENOUS | Status: AC
  Administered 2020-08-03: 20 ug/min via INTRAVENOUS
  Filled 2020-08-02: qty 250

## 2020-08-02 MED ORDER — EPINEPHRINE HCL 5 MG/250ML IV SOLN IN NS
0.0000 ug/min | INTRAVENOUS | Status: DC
  Filled 2020-08-02: qty 250

## 2020-08-02 MED ORDER — SODIUM CHLORIDE 0.9 % IV SOLN
1.5000 g | INTRAVENOUS | Status: AC
  Administered 2020-08-03: 1.5 g via INTRAVENOUS
  Filled 2020-08-02: qty 1.5

## 2020-08-02 MED ORDER — VANCOMYCIN HCL 1500 MG/300ML IV SOLN
1500.0000 mg | INTRAVENOUS | Status: AC
  Administered 2020-08-03: 1500 mg via INTRAVENOUS
  Filled 2020-08-02: qty 300

## 2020-08-02 MED ORDER — SODIUM CHLORIDE 0.9 % IV SOLN
750.0000 mg | INTRAVENOUS | Status: AC
  Administered 2020-08-03: 750 mg via INTRAVENOUS
  Filled 2020-08-02: qty 750

## 2020-08-02 MED ORDER — INSULIN REGULAR(HUMAN) IN NACL 100-0.9 UT/100ML-% IV SOLN
INTRAVENOUS | Status: AC
  Administered 2020-08-03: 3.6 [IU]/h via INTRAVENOUS
  Filled 2020-08-02: qty 100

## 2020-08-02 MED ORDER — TRANEXAMIC ACID 1000 MG/10ML IV SOLN
1.5000 mg/kg/h | INTRAVENOUS | Status: AC
  Administered 2020-08-03: 1.5 mg/kg/h via INTRAVENOUS
  Filled 2020-08-02: qty 25

## 2020-08-02 MED ORDER — NOREPINEPHRINE 4 MG/250ML-% IV SOLN
0.0000 ug/min | INTRAVENOUS | Status: DC
  Filled 2020-08-02: qty 250

## 2020-08-02 MED ORDER — MAGNESIUM SULFATE 50 % IJ SOLN
40.0000 meq | INTRAMUSCULAR | Status: DC
  Filled 2020-08-02: qty 9.85

## 2020-08-02 MED ORDER — TRANEXAMIC ACID (OHS) BOLUS VIA INFUSION
15.0000 mg/kg | INTRAVENOUS | Status: AC
  Administered 2020-08-03: 1620 mg via INTRAVENOUS
  Filled 2020-08-02: qty 1620

## 2020-08-02 MED ORDER — POTASSIUM CHLORIDE 2 MEQ/ML IV SOLN
80.0000 meq | INTRAVENOUS | Status: DC
  Filled 2020-08-02: qty 40

## 2020-08-02 MED ORDER — TRANEXAMIC ACID (OHS) PUMP PRIME SOLUTION
2.0000 mg/kg | INTRAVENOUS | Status: DC
  Filled 2020-08-02: qty 2.16

## 2020-08-02 MED ORDER — PLASMA-LYTE 148 IV SOLN
INTRAVENOUS | Status: DC
  Filled 2020-08-02: qty 2.5

## 2020-08-02 MED ORDER — SODIUM CHLORIDE 0.9 % IV SOLN
INTRAVENOUS | Status: DC
  Filled 2020-08-02: qty 30

## 2020-08-02 MED ORDER — MILRINONE LACTATE IN DEXTROSE 20-5 MG/100ML-% IV SOLN
0.3000 ug/kg/min | INTRAVENOUS | Status: DC
  Filled 2020-08-02: qty 100

## 2020-08-02 MED ORDER — NITROGLYCERIN IN D5W 200-5 MCG/ML-% IV SOLN
2.0000 ug/min | INTRAVENOUS | Status: AC
  Administered 2020-08-03: 16.6 ug/min via INTRAVENOUS
  Filled 2020-08-02: qty 250

## 2020-08-02 NOTE — H&P (Signed)
[address]       [place] [address]             [phone]      Cardiothoracic Surgery Admission History and Physical   PCP is Ria Bush, MD Referring Provider is End, Harrell Gave, MD      Chief Complaint  Patient presents with  . Coronary Artery Disease        HPI:  The patient is a 69 year old woman with history of type 2 diabetes, hypertension, hyperlipidemia, hypothyroidism, remote smoking and COPD, bipolar disorder, fibromyalgia, GERD, OSA who reports a history of exertional shortness of breath for the last 10 years which she said is worsening recently. She has also developed substernal chest discomfort and generalized weakness with exertion and said that walking from her car today completely wore her out. She has been worked up by pulmonary medicine and PFT showed no significant obstruction or restrictive disease. A 2D echocardiogram on 06/15/2020 was a poor quality study due to poor acoustic windows even with contrast agent. A Lexiscan Myoview showed a small severe apical inferior and apical defect that could not be ruled out as scar or ischemia. Left ventricular ejection fraction was mildly decreased at 45% with apical and apical inferior hypokinesis. Cardiac catheterization 07/13/2020 showed severe single-vessel coronary disease with 70% ostial LAD stenosis and 90% mid LAD stenosis with a heavily calcified proximal and mid LAD. This was a large vessel that wraps around the apex. There is no significant disease in the left circumflex or right coronary arteries. Left and right heart pressures are normal.  She lives with her husband. She uses a rolling walker to ambulate due to arthritis, generalized weakness, and shortness of breath.  She has had some substernal chest discomfort with exertion.  She denies any dizziness or syncope.  She has had no orthopnea.      Past Medical History:  Diagnosis Date  . Anxiety   . Bipolar disorder ([place])     has stopped seeing psychiatrist  . Cataract   . Chronic bronchitis   . COPD (chronic obstructive pulmonary disease) ([place])   . Coronary artery disease, non-occlusive   . Depression   . Dyspnea 2012   s/p pulm/cards w/u WNL, thought due to obesity/deconditioning  . Fibromyalgia   . GERD (gastroesophageal reflux disease)   . History of chicken pox   . HLD (hyperlipidemia)   . Hypertension   . Hypothyroidism   . Migraine   . OSA (obstructive sleep apnea) 03/16/2011  . Sleep apnea   . T2DM (type 2 diabetes mellitus) ([place]) 2012   DM education 06/2011  . Urinary incontinence          Past Surgical History:  Procedure Laterality Date  . BREAST CYST ASPIRATION Right 2003  . cardiac catherization  03/2011   x3 [facility]), mild nonobstructive CAD  . COLONOSCOPY  12/2011   hyperplastic polyp x2,. diverticulosis, rec rpt 10 yrs  . ESOPHAGOGASTRODUODENOSCOPY  2006  . KNEE SURGERY  2006   left, torn meniscus  . RIGHT/LEFT HEART CATH AND CORONARY ANGIOGRAPHY N/A 07/13/2020   Procedure: RIGHT/LEFT HEART CATH AND CORONARY ANGIOGRAPHY;  Surgeon: Nelva Bush, MD;  Location: [facility];  Service: Cardiovascular;  Laterality: N/A;  . TOTAL ABDOMINAL HYSTERECTOMY  2004   fibroids, cervix remained  . TOTAL KNEE ARTHROPLASTY Left 08/2012   [name], [place] [facility]         Family History  Problem Relation Age of Onset  .  Asthma Maternal Uncle   . Cancer Maternal Uncle        colon  . Aneurysm Maternal Uncle   . Colon cancer Maternal Uncle 24  . Hypertension Mother   . Stroke Mother   . Arthritis Mother   . COPD Sister   . Acute lymphoblastic leukemia Cousin 3       died at age 65  . Cancer Father 41       brain tumor  . Diabetes Father   . Thyroid disease Father   . Cancer Maternal Aunt 33       breast  . Breast cancer Maternal Aunt   . Cancer Paternal Uncle        prostate  . Coronary artery disease Neg Hx   .  Stomach cancer Neg Hx   . Rectal cancer Neg Hx   . Esophageal cancer Neg Hx     Social History Social History        Tobacco Use  . Smoking status: Former Smoker    Packs/day: 2.00    Years: 43.00    Pack years: 86.00    Types: Cigarettes    Quit date: 05/17/2010    Years since quitting: 10.1  . Smokeless tobacco: Never Used  Vaping Use  . Vaping Use: Never used  Substance Use Topics  . Alcohol use: No    Alcohol/week: 0.0 standard drinks  . Drug use: No          Current Outpatient Medications  Medication Sig Dispense Refill  . acetaminophen (TYLENOL) 650 MG CR tablet Take 1,300 mg by mouth in the morning and at bedtime.    Marland Kitchen albuterol (VENTOLIN HFA) 108 (90 Base) MCG/ACT inhaler Inhale 2 puffs into the lungs every 6 (six) hours as needed for wheezing or shortness of breath. 8 g 3  . APPLE CIDER VINEGAR PO Take 1 tablet by mouth in the morning and at bedtime.     Marland Kitchen aspirin EC 81 MG tablet Take 81 mg by mouth every evening.     . [place]-Glucosamine-Vit D (OSTEO BI-FLEX ONE PER DAY PO) Take 1 tablet by mouth daily.     . butalbital-acetaminophen-caffeine (FIORICET) 50-325-40 MG tablet Take 1 tablet by mouth 2 (two) times daily as needed for headache or migraine. 20 tablet 1  . carvedilol (COREG) 3.125 MG tablet TAKE 1 TABLET TWICE A DAY  WITH MEALS (Patient taking differently: Take 3.125 mg by mouth in the morning and at bedtime. ) 180 tablet 0  . famotidine (PEPCID) 20 MG tablet Take 20 mg by mouth every other day. Alternates with omeprazole    . glimepiride (AMARYL) 1 MG tablet Take 1 tablet (1 mg total) by mouth daily with breakfast. 90 tablet 1  . hydrochlorothiazide (HYDRODIURIL) 25 MG tablet TAKE 1 TABLET DAILY (Patient taking differently: Take 25 mg by mouth daily. ) 90 tablet 0  . INS SYRINGE/NEEDLE 1CC/28G (B-D INS SYR MICROFINE 1CC/28G) 28G X 1/2" 1 ML MISC USE AS DIRECTED 100 each 3  . levothyroxine (SYNTHROID) 100 MCG tablet TAKE 1  TABLET DAILY, AND ON2 DAYS A WEEK, TAKE AN     EXTRA 1/2 TABLET (Patient taking differently: Take 100-150 mcg by mouth See admin instructions. Take 1.5 tablets (150 mcg) by mouth on Mondays & Fridays before breakfast. Take 1 tablet (100 mcg) by mouth on Sundays, Tuesdays, Wednesdays, Thursdays & Saturdays before breakfast) 90 tablet 0  . lisinopril (ZESTRIL) 5 MG tablet TAKE 1 TABLET DAILY 90 tablet 3  .  loratadine (CLARITIN) 10 MG tablet Take 1 tablet (10 mg total) by mouth daily.    . metFORMIN (GLUCOPHAGE) 500 MG tablet Take 2 tablets (1,000 mg total) by mouth in the morning and at bedtime.    . Multiple Vitamin (MULTIVITAMIN WITH MINERALS) TABS tablet Take 1 tablet by mouth daily. One A Day for Women    . NOVOLIN N 100 UNIT/ML injection INJECT 55 UNITS            SUBCUTANEOUSLY AT BEDTIME (Patient taking differently: Inject 40 Units into the skin daily after supper. ) 36 mL 3  . nystatin cream (MYCOSTATIN) APPLY TO AFFECTED AREA TWICE A DAY 30 g 1  . omeprazole (PRILOSEC) 20 MG capsule TAKE 1 DAILY-ALTERNATES WITH FAMOTIDINE (Patient taking differently: Take 20 mg by mouth every other day. Take 1 daily-Alternates with famotidine) 90 capsule 0  . ONE TOUCH LANCETS MISC Use to check sugar twice daily Dx: E11.8 200 each 3  . ONETOUCH ULTRA test strip USE TO CHECK BLOOD SUGAR 2 TIMES   DAILY 200 strip 3  . pioglitazone (ACTOS) 15 MG tablet TAKE 1 TABLET DAILY (Patient taking differently: Take 15 mg by mouth daily. ) 90 tablet 0  . Rhubarb (ESTROVEN COMPLETE PO) Take 1 tablet by mouth daily.    . simvastatin (ZOCOR) 40 MG tablet TAKE 1 TABLET AT BEDTIME (Patient taking differently: Take 40 mg by mouth daily after supper. ) 90 tablet 3  . topiramate (TOPAMAX) 100 MG tablet Take 1 tablet (100 mg total) by mouth at bedtime. (Patient taking differently: Take 100 mg by mouth at bedtime. ) 90 tablet 1  . umeclidinium-vilanterol (ANORO ELLIPTA) 62.5-25 MCG/INH AEPB Inhale 1 puff into the lungs daily.  60 each 5  . ziprasidone (GEODON) 40 MG capsule TAKE 1 CAPSULE TWICE DAILY WITH MEALS (Patient taking differently: Take 40 mg by mouth in the morning and at bedtime. ) 180 capsule 1   No current facility-administered medications for this visit.         Allergies  Allergen Reactions  . Crestor [Rosuvastatin] Other (See Comments)    myalgias  . Lipitor [Atorvastatin] Other (See Comments)    Mood swings  . Lithium Other (See Comments)    abd pain, n/v, was hospitalized  . Quetiapine Other (See Comments)    Muscle twitching, muscle spasm    Review of Systems  Constitutional: Positive for appetite change and fatigue.  HENT: Negative.   Eyes: Negative.   Respiratory: Positive for shortness of breath.   Cardiovascular: Positive for chest pain. Negative for leg swelling.  Gastrointestinal: Positive for diarrhea.  Endocrine: Negative.   Genitourinary: Negative.   Musculoskeletal: Positive for arthralgias and gait problem.  Skin: Negative.   Allergic/Immunologic: Negative.   Neurological: Positive for dizziness, numbness and headaches.       Chronic pain  Psychiatric/Behavioral:       Bipolar    BP 100/64 (BP Location: Right Arm, Patient Position: Sitting)   Pulse 93   Resp 18   Ht 5\' 2"  (1.575 m)   Wt 237 lb (107.5 kg)   SpO2 96% Comment: RA  BMI 43.35 kg/m  Physical Exam HENT:     Head: Normocephalic and atraumatic.  Eyes:     Extraocular Movements: Extraocular movements intact.     Pupils: Pupils are equal, round, and reactive to light.  Cardiovascular:     Rate and Rhythm: Normal rate and regular rhythm.     Pulses: Normal pulses.  Heart sounds: Normal heart sounds. No murmur heard.   Pulmonary:     Effort: Pulmonary effort is normal.     Breath sounds: Normal breath sounds.  Abdominal:     Tenderness: There is no abdominal tenderness.  Musculoskeletal:        General: No swelling.  Skin:    General: Skin is dry.  Neurological:      General: No focal deficit present.     Mental Status: She is oriented to person, place, and time.  Psychiatric:        Mood and Affect: Mood normal.        Behavior: Behavior normal.        Thought Content: Thought content normal.        Judgment: Judgment normal.      Diagnostic Tests:  Physicians  Panel Physicians Referring Physician Case Authorizing Physician  End, Harrell Gave, MD (Primary)    Procedures  RIGHT/LEFT HEART CATH AND CORONARY ANGIOGRAPHY  Conclusion  Conclusions: 1. Severe single-vessel coronary artery disease with sequential 70% ostial and 90% mid LAD stenoses. The proximal and mid LAD is heavily calcified. 2. No significant CAD involving the LCx and RCA. 3. Normal left and right heart filling pressures. 4. Low normal to mildly reduced Fick cardiac output/index.  Recommendations: 1. Refer to cardiac surgery for evaluation for CABG. Ostial LAD disease is not optimal for stenting, as it could jeopardize the LCx and high diagonal branches. If the patient is not a surgical candidate, high risk PCI with atherectomy could be considered. 2. Aggressive secondary prevention of coronary artery disease. 3. Consider alternative modality for assessing left ventricular systolic function, given poor visualization of the left ventricle by echo and catheterization.  Nelva Bush, MD [facility]  Recommendations  Antiplatelet/Anticoag Recommend Aspirin 81mg  daily for moderate CAD. Defer adding P2Y12 inhibitor pending cardiac surgery evaluation.  Discharge Date In the absence of any other complications or medical issues, we expect the patient to be ready for discharge from a cath perspective on 07/13/2020.  Indications  Abnormal cardiovascular stress test [R94.39 (ICD-10-CM)]  Dyspnea on exertion [R06.00 (ICD-10-CM)]  Procedural Details  Technical Details Indication: 69 y.o. year-old woman with history of hypertension, hyperlipidemia, diabetes,  former smoker x40 years, COPD, OSA, presenting for evaluation of shortness of breath and abnormal stress test.  GFR: >60 ml/min  Procedure: The risks, benefits, complications, treatment options, and expected outcomes were discussed with the patient. The patient and/or family concurred with the proposed plan, giving informed consent. The patient was sedated with IV midazolam and fentanyl. The right wrist and elbow were prepped and draped in a sterile fashion. 1% lidocaine was used for local anesthesia. A previously placed antecubital vein IV was exchanged for a 364F slender Glidesheath using modified Seldinger technique. Right heart catheterization was performed by advancing a 364F balloon-tipped catheter through the right heart chambers into the pulmonary capillary wedge position. Pressure measurements and oxygen saturations were obtained.  Ultrasound was used to evaluate the right radial artery. It was patent. A micropuncture needle was used to access the renal artery under ultrasound guidance. Using the modified Seldinger access technique, a 63F slender Glidesheath was placed in the right radial artery. 3 mg Verapamil was given through the sheath. Heparin 5,000 units were administered.  Selective coronary angiography was performed using a 364F TIG4.0 catheter to engage the left coronary artery and a 364F TIG4.0 catheter to engage the right coronary artery. Left heart catheterization was performed using a 364F TIG4.0 catheter. Left ventriculogram  was performed not performed, as the JR4 catheter could not be adequately positioned for hand injection ventriculography. The aortic valve could also not be crossed with an angled pigtail catheter.  At the end of the procedure, the radial artery sheath was removed and a TR band applied to achieve patent hemostasis. The antecubital vein sheath was removed and hemostasis achieved with manual compression. There were no immediate complications. The patient was taken to the  recovery area in stable condition.  Estimated blood loss <50 mL.   During this procedure medications were administered to achieve and maintain moderate conscious sedation while the patient's heart rate, blood pressure, and oxygen saturation were continuously monitored and I was present face-to-face 100% of this time.  Medications (Filter: Administrations occurring from 2993 to 0844 on 07/13/20) (important) Continuous medications are totaled by the amount administered until 07/13/20 0844.  fentaNYL (SUBLIMAZE) injection (mcg) Total dose:  12.5 mcg Date/Time  Rate/Dose/Volume Action  07/13/20 0753  12.5 mcg Given    midazolam (VERSED) injection (mg) Total dose:  0.5 mg Date/Time  Rate/Dose/Volume Action  07/13/20 0754  0.5 mg Given    Heparin (Porcine) in NaCl 1000-0.9 UT/500ML-% SOLN (mL) Total volume:  500 mL Date/Time  Rate/Dose/Volume Action  07/13/20 0754  500 mL Given    heparin sodium (porcine) injection (Units) Total dose:  5,000 Units Date/Time  Rate/Dose/Volume Action  07/13/20 0810  5,000 Units Given    verapamil (ISOPTIN) injection (mg) Total dose:  2.5 mg Date/Time  Rate/Dose/Volume Action  07/13/20 0806  2.5 mg Given    iohexol (OMNIPAQUE) 300 MG/ML solution (mL) Total volume:  80 mL Date/Time  Rate/Dose/Volume Action  07/13/20 0839  80 mL Given    lidocaine (PF) (XYLOCAINE) 1 % injection (mL) Total volume:  2 mL Date/Time  Rate/Dose/Volume Action  07/13/20 0835  2 mL Given    0.9 % sodium chloride infusion (mL/hr) Total volume:  12.94 mL Dosing weight:  112.2 Date/Time  Rate/Dose/Volume Action  07/13/20 0727  10 mL - 10 mL/hr New Bag/Given    sodium chloride flush (NS) 0.9 % injection 3 mL (mL) Total volume:  3 mL Dosing weight:  112.2 Date/Time  Rate/Dose/Volume Action  07/13/20 0727  3 mL Given    Sedation Time  Sedation Time Physician-1: 43 minutes 2 seconds  Contrast  Medication Name Total Dose  iohexol (OMNIPAQUE) 300  MG/ML solution 80 mL    Radiation/Fluoro  Fluoro time: 13.3 (min) DAP: 48.4 (Gycm2) Cumulative Air Kerma: 716 (mGy)  Complications  Complications documented before study signed (07/13/2020 9:67 AM)   No complications were associated with this study.  Documented by Nelva Bush, MD - 07/13/2020 9:09 AM    Coronary Findings  Diagnostic Dominance: Right Left Main  Vessel is large.  Mid LM lesion is 20% stenosed.  Left Anterior Descending  The vessel is severely calcified.  Ost LAD lesion is 70% stenosed. The lesion is eccentric.  Mid LAD lesion is 90% stenosed. The lesion is eccentric. The lesion is severely calcified.  First Diagonal Branch  Vessel is small in size.  Second Diagonal Branch  Vessel is small in size.  Third Diagonal Branch  Vessel is small in size.  Left Circumflex  Vessel is large. Vessel is angiographically normal.  First Obtuse Marginal Branch  Vessel is small in size.  Second Obtuse Marginal Branch  Vessel is large in size.  Third Obtuse Marginal Branch  Vessel is small in size.  Right Coronary Artery  Vessel is moderate in  size. Vessel is angiographically normal.  Right Posterior Descending Artery  Vessel is small in size.  Right Posterior Atrioventricular Artery  Vessel is small in size.  Intervention  No interventions have been documented. Right Heart  Right Heart Pressures RA (mean): 5 mmHg RV (S/EDP): 30/5 mmHg PA (S/D, mean): 29/11 mmHg PCWP (mean): 9 mmHg  Ao sat: 100% PA sat: 76%  Fick CO: 4.9 L/min Fick CI: 2.3 L/min/m^2  Left Heart  Left Ventricle LV end diastolic pressure is normal. LVEDP 10 mmHg.  Aortic Valve There is no aortic valve stenosis.  Coronary Diagrams  Diagnostic Dominance: Right  Intervention  Implants      No implant documentation for this case.  Syngo Images  Show images for CARDIAC CATHETERIZATION Images on Long Term Storage  Show images for Lynnett, Langlinais to  Procedure Log  Procedure Log    Hemo Data (last day) before discharge   AO Systolic Cath Pressure  AO Diastolic Cath Pressure  AO Mean Cath Pressure  LV Systolic Cath Pressure  LV End Diastolic  LV Systolic  LV End Diastolic  LV dP/dt  PA Systolic Cath Pressure  PA Diastolic Cath Pressure  PA Mean Cath Pressure  RA Wedge A Wave  RA Wedge V Wave  RV Systolic Cath Pressure  RV Diastolic Cath Pressure  RV End Diastolic  RV Systolic  RV End Diastolic  RV dP/dt  PCW A Wave  PCW V Wave  PCW Mean  AO O2 Sat  PA O2 Sat  AO O2 Sat  Fick C.O.  Fick C.I.   --  --  --  --  --  141 mmHg  11 mmHg  1392 mmHg/sec  --  --  --  6 mmHg  2 mmHg  --  --  --  25 mmHg  2 mmHg  384 mmHg/sec  7 mmHg  4 mmHg  2 mmHg  --  --  --  4.89 L/min  2.34 L/min/m2   --  --  --  --  --  --  --  --  --  --  --  --  --  25 mmHg  -3 mmHg  2 mmHg  --  --  --  --  --  --  --  --  --  --  --   --  --  --  --  --  --  --  --  25 mmHg  9 mmHg  15 mmHg  --  --  --  --  --  --  --  --  --  --  --  --  --  --  --  --   136  70 mmHg  88 mmHg  --  --  --  --  --  --  --  --  --  --  --  --  --  --  --  --  --  --  --  --  --  --  --  --   --  --  --  --  --  --  --  --  --  --  --  --  --  --  --  --  --  --  --  --  --  --  100 %  --  SA  --  --   --  --  --  --  --  --  --  --  --  --  --  --  --  --  --  --  --  --  --  --  --  --  --  PA  --  --  --   --  --  --  133 mmHg  10 mmHg  --  --  --  --  --  --  --  --  --  --  --  --  --  --  --  --  --  --  --  --  --  --   --  --  --  146 mmHg  6 mmHg  --  --  --  --  --  --  --  --  --  --  --  --  --  --  --  --  --  --  --  --  --  --   --  --  --  141 mmHg  11 mmHg  --  --  --  --  --  --  --  --  --  --  --  --  --  --  --  --  --   --  --  --  --  --   144  72 mmHg  103 mmHg  --  --  --  --  --  --  --  --  --  --                                Impression:  This 69 year old woman has progressive exertional fatigue, shortness of breath, and substernal chest discomfort with high-grade sequential LAD stenosis.  There is a large vessel that wraps around the apex.  She has an abnormal nuclear scan showing apical inferior and apical scar or ischemia and I suspect that her symptoms are related to her LAD stenosis.  She does have a long history of heavy smoking but PFTs are showing no significant obstructive or restrictive disease and her arterial blood gas is unremarkable.  Her proximal and mid LAD are heavily calcified and I agree that coronary artery bypass graft surgery is the best treatment to revascularize her heart. I discussed the operative procedure with the patient and and her husband including alternatives, benefits and risks; including but not limited to bleeding, blood transfusion, infection, stroke, myocardial infarction, graft failure, heart block requiring a permanent pacemaker, organ dysfunction, and death.  Ethelyne Cherlynn June understands and agrees to proceed.   Plan:  Coronary bypass graft surgery.   Gaye Pollack, MD [facility] [phone]

## 2020-08-03 ENCOUNTER — Other Ambulatory Visit: Payer: Self-pay

## 2020-08-03 ENCOUNTER — Inpatient Hospital Stay (HOSPITAL_COMMUNITY): Payer: Medicare HMO

## 2020-08-03 ENCOUNTER — Inpatient Hospital Stay (HOSPITAL_COMMUNITY): Payer: Medicare HMO | Admitting: Certified Registered Nurse Anesthetist

## 2020-08-03 ENCOUNTER — Inpatient Hospital Stay (HOSPITAL_COMMUNITY)
Admission: RE | Disposition: A | Discharge status: Home / Self Care | Payer: Self-pay | Source: Home / Self Care | Attending: Surgery

## 2020-08-03 ENCOUNTER — Encounter (HOSPITAL_COMMUNITY): Payer: Self-pay | Admitting: Surgery

## 2020-08-03 ENCOUNTER — Inpatient Hospital Stay (HOSPITAL_COMMUNITY): Payer: Medicare HMO | Admitting: Physician Assistant

## 2020-08-03 ENCOUNTER — Inpatient Hospital Stay (HOSPITAL_COMMUNITY)
Admission: RE | Admit: 2020-08-03 | Discharge: 2020-08-09 | DRG: 236 | Disposition: A | Discharge status: Home / Self Care | Payer: Medicare HMO | Attending: Surgery | Admitting: Surgery

## 2020-08-03 DIAGNOSIS — Z87891 Personal history of nicotine dependence: Secondary | ICD-10-CM

## 2020-08-03 DIAGNOSIS — Z8349 Family history of other endocrine, nutritional and metabolic diseases: Secondary | ICD-10-CM

## 2020-08-03 DIAGNOSIS — R519 Headache, unspecified: Secondary | ICD-10-CM | POA: Diagnosis not present

## 2020-08-03 DIAGNOSIS — Z79890 Hormone replacement therapy (postmenopausal): Secondary | ICD-10-CM

## 2020-08-03 DIAGNOSIS — D72829 Elevated white blood cell count, unspecified: Secondary | ICD-10-CM | POA: Diagnosis not present

## 2020-08-03 DIAGNOSIS — E119 Type 2 diabetes mellitus without complications: Secondary | ICD-10-CM | POA: Diagnosis present

## 2020-08-03 DIAGNOSIS — J449 Chronic obstructive pulmonary disease, unspecified: Secondary | ICD-10-CM | POA: Diagnosis present

## 2020-08-03 DIAGNOSIS — R69 Illness, unspecified: Secondary | ICD-10-CM | POA: Diagnosis not present

## 2020-08-03 DIAGNOSIS — R5381 Other malaise: Secondary | ICD-10-CM | POA: Diagnosis present

## 2020-08-03 DIAGNOSIS — M1990 Unspecified osteoarthritis, unspecified site: Secondary | ICD-10-CM | POA: Diagnosis present

## 2020-08-03 DIAGNOSIS — Z951 Presence of aortocoronary bypass graft: Secondary | ICD-10-CM

## 2020-08-03 DIAGNOSIS — E871 Hypo-osmolality and hyponatremia: Secondary | ICD-10-CM | POA: Diagnosis present

## 2020-08-03 DIAGNOSIS — Z8261 Family history of arthritis: Secondary | ICD-10-CM

## 2020-08-03 DIAGNOSIS — Z825 Family history of asthma and other chronic lower respiratory diseases: Secondary | ICD-10-CM

## 2020-08-03 DIAGNOSIS — Z794 Long term (current) use of insulin: Secondary | ICD-10-CM

## 2020-08-03 DIAGNOSIS — Z79899 Other long term (current) drug therapy: Secondary | ICD-10-CM

## 2020-08-03 DIAGNOSIS — Z6841 Body Mass Index (BMI) 40.0 and over, adult: Secondary | ICD-10-CM

## 2020-08-03 DIAGNOSIS — E039 Hypothyroidism, unspecified: Secondary | ICD-10-CM | POA: Diagnosis present

## 2020-08-03 DIAGNOSIS — J80 Acute respiratory distress syndrome: Secondary | ICD-10-CM | POA: Diagnosis not present

## 2020-08-03 DIAGNOSIS — I10 Essential (primary) hypertension: Secondary | ICD-10-CM | POA: Diagnosis present

## 2020-08-03 DIAGNOSIS — J9811 Atelectasis: Secondary | ICD-10-CM | POA: Diagnosis not present

## 2020-08-03 DIAGNOSIS — I2510 Atherosclerotic heart disease of native coronary artery without angina pectoris: Secondary | ICD-10-CM | POA: Diagnosis not present

## 2020-08-03 DIAGNOSIS — J90 Pleural effusion, not elsewhere classified: Secondary | ICD-10-CM | POA: Diagnosis not present

## 2020-08-03 DIAGNOSIS — Z7982 Long term (current) use of aspirin: Secondary | ICD-10-CM

## 2020-08-03 DIAGNOSIS — I700 Atherosclerosis of aorta: Secondary | ICD-10-CM | POA: Diagnosis present

## 2020-08-03 DIAGNOSIS — Z96652 Presence of left artificial knee joint: Secondary | ICD-10-CM | POA: Diagnosis present

## 2020-08-03 DIAGNOSIS — Z09 Encounter for follow-up examination after completed treatment for conditions other than malignant neoplasm: Secondary | ICD-10-CM

## 2020-08-03 DIAGNOSIS — Z833 Family history of diabetes mellitus: Secondary | ICD-10-CM

## 2020-08-03 DIAGNOSIS — E876 Hypokalemia: Secondary | ICD-10-CM | POA: Diagnosis present

## 2020-08-03 DIAGNOSIS — T502X5A Adverse effect of carbonic-anhydrase inhibitors, benzothiadiazides and other diuretics, initial encounter: Secondary | ICD-10-CM | POA: Diagnosis not present

## 2020-08-03 DIAGNOSIS — Z800 Family history of malignant neoplasm of digestive organs: Secondary | ICD-10-CM

## 2020-08-03 DIAGNOSIS — F319 Bipolar disorder, unspecified: Secondary | ICD-10-CM | POA: Diagnosis present

## 2020-08-03 DIAGNOSIS — M797 Fibromyalgia: Secondary | ICD-10-CM | POA: Diagnosis present

## 2020-08-03 DIAGNOSIS — K219 Gastro-esophageal reflux disease without esophagitis: Secondary | ICD-10-CM | POA: Diagnosis present

## 2020-08-03 DIAGNOSIS — R110 Nausea: Secondary | ICD-10-CM | POA: Diagnosis not present

## 2020-08-03 DIAGNOSIS — E8770 Fluid overload, unspecified: Secondary | ICD-10-CM | POA: Diagnosis not present

## 2020-08-03 DIAGNOSIS — D62 Acute posthemorrhagic anemia: Secondary | ICD-10-CM | POA: Diagnosis not present

## 2020-08-03 DIAGNOSIS — G4733 Obstructive sleep apnea (adult) (pediatric): Secondary | ICD-10-CM | POA: Diagnosis present

## 2020-08-03 DIAGNOSIS — Z888 Allergy status to other drugs, medicaments and biological substances status: Secondary | ICD-10-CM

## 2020-08-03 DIAGNOSIS — M549 Dorsalgia, unspecified: Secondary | ICD-10-CM | POA: Diagnosis not present

## 2020-08-03 DIAGNOSIS — R0602 Shortness of breath: Secondary | ICD-10-CM | POA: Diagnosis not present

## 2020-08-03 DIAGNOSIS — E785 Hyperlipidemia, unspecified: Secondary | ICD-10-CM | POA: Diagnosis not present

## 2020-08-03 DIAGNOSIS — Z8249 Family history of ischemic heart disease and other diseases of the circulatory system: Secondary | ICD-10-CM

## 2020-08-03 DIAGNOSIS — Z823 Family history of stroke: Secondary | ICD-10-CM

## 2020-08-03 DIAGNOSIS — Z803 Family history of malignant neoplasm of breast: Secondary | ICD-10-CM

## 2020-08-03 DIAGNOSIS — Z806 Family history of leukemia: Secondary | ICD-10-CM

## 2020-08-03 DIAGNOSIS — I081 Rheumatic disorders of both mitral and tricuspid valves: Secondary | ICD-10-CM | POA: Diagnosis not present

## 2020-08-03 HISTORY — PX: TEE WITHOUT CARDIOVERSION: SHX5443

## 2020-08-03 HISTORY — PX: CORONARY ARTERY BYPASS GRAFT: SHX141

## 2020-08-03 LAB — CBC
HCT: 32 % — ABNORMAL LOW (ref 36.0–46.0)
HCT: 31.1 % — ABNORMAL LOW (ref 36.0–46.0)
Hemoglobin: 10.6 g/dL — ABNORMAL LOW (ref 12.0–15.0)
Hemoglobin: 11 g/dL — ABNORMAL LOW (ref 12.0–15.0)
MCH: 30.9 pg (ref 26.0–34.0)
MCH: 32.2 pg (ref 26.0–34.0)
MCHC: 33.1 g/dL (ref 30.0–36.0)
MCHC: 35.4 g/dL (ref 30.0–36.0)
MCV: 93.3 fL (ref 80.0–100.0)
MCV: 90.9 fL (ref 80.0–100.0)
Platelets: 305 10*3/uL (ref 150–400)
Platelets: 328 10*3/uL (ref 150–400)
RBC: 3.43 MIL/uL — ABNORMAL LOW (ref 3.87–5.11)
RBC: 3.42 MIL/uL — ABNORMAL LOW (ref 3.87–5.11)
RDW: 13.2 % (ref 11.5–15.5)
RDW: 13.3 % (ref 11.5–15.5)
WBC: 28 10*3/uL — ABNORMAL HIGH (ref 4.0–10.5)
WBC: 29.1 10*3/uL — ABNORMAL HIGH (ref 4.0–10.5)
nRBC: 0 % (ref 0.0–0.2)
nRBC: 0 % (ref 0.0–0.2)

## 2020-08-03 LAB — POCT I-STAT, CHEM 8
BUN: 5 mg/dL — ABNORMAL LOW (ref 8–23)
BUN: 4 mg/dL — ABNORMAL LOW (ref 8–23)
BUN: 4 mg/dL — ABNORMAL LOW (ref 8–23)
BUN: 4 mg/dL — ABNORMAL LOW (ref 8–23)
Calcium, Ion: 1.23 mmol/L (ref 1.15–1.40)
Calcium, Ion: 1.18 mmol/L (ref 1.15–1.40)
Calcium, Ion: 1.08 mmol/L — ABNORMAL LOW (ref 1.15–1.40)
Calcium, Ion: 1.11 mmol/L — ABNORMAL LOW (ref 1.15–1.40)
Chloride: 95 mmol/L — ABNORMAL LOW (ref 98–111)
Chloride: 97 mmol/L — ABNORMAL LOW (ref 98–111)
Chloride: 96 mmol/L — ABNORMAL LOW (ref 98–111)
Chloride: 97 mmol/L — ABNORMAL LOW (ref 98–111)
Creatinine, Ser: 0.5 mg/dL (ref 0.44–1.00)
Creatinine, Ser: 0.5 mg/dL (ref 0.44–1.00)
Creatinine, Ser: 0.4 mg/dL — ABNORMAL LOW (ref 0.44–1.00)
Creatinine, Ser: 0.4 mg/dL — ABNORMAL LOW (ref 0.44–1.00)
Glucose, Bld: 136 mg/dL — ABNORMAL HIGH (ref 70–99)
Glucose, Bld: 101 mg/dL — ABNORMAL HIGH (ref 70–99)
Glucose, Bld: 100 mg/dL — ABNORMAL HIGH (ref 70–99)
Glucose, Bld: 118 mg/dL — ABNORMAL HIGH (ref 70–99)
HCT: 34 % — ABNORMAL LOW (ref 36.0–46.0)
HCT: 30 % — ABNORMAL LOW (ref 36.0–46.0)
HCT: 26 % — ABNORMAL LOW (ref 36.0–46.0)
HCT: 30 % — ABNORMAL LOW (ref 36.0–46.0)
Hemoglobin: 11.6 g/dL — ABNORMAL LOW (ref 12.0–15.0)
Hemoglobin: 10.2 g/dL — ABNORMAL LOW (ref 12.0–15.0)
Hemoglobin: 10.2 g/dL — ABNORMAL LOW (ref 12.0–15.0)
Hemoglobin: 8.8 g/dL — ABNORMAL LOW (ref 12.0–15.0)
Potassium: 3.1 mmol/L — ABNORMAL LOW (ref 3.5–5.1)
Potassium: 2.9 mmol/L — ABNORMAL LOW (ref 3.5–5.1)
Potassium: 2.9 mmol/L — ABNORMAL LOW (ref 3.5–5.1)
Potassium: 3.7 mmol/L (ref 3.5–5.1)
Sodium: 132 mmol/L — ABNORMAL LOW (ref 135–145)
Sodium: 133 mmol/L — ABNORMAL LOW (ref 135–145)
Sodium: 133 mmol/L — ABNORMAL LOW (ref 135–145)
Sodium: 134 mmol/L — ABNORMAL LOW (ref 135–145)
TCO2: 25 mmol/L (ref 22–32)
TCO2: 24 mmol/L (ref 22–32)
TCO2: 23 mmol/L (ref 22–32)
TCO2: 24 mmol/L (ref 22–32)

## 2020-08-03 LAB — GLUCOSE, CAPILLARY
Glucose-Capillary: 110 mg/dL — ABNORMAL HIGH (ref 70–99)
Glucose-Capillary: 114 mg/dL — ABNORMAL HIGH (ref 70–99)
Glucose-Capillary: 116 mg/dL — ABNORMAL HIGH (ref 70–99)
Glucose-Capillary: 121 mg/dL — ABNORMAL HIGH (ref 70–99)
Glucose-Capillary: 124 mg/dL — ABNORMAL HIGH (ref 70–99)
Glucose-Capillary: 125 mg/dL — ABNORMAL HIGH (ref 70–99)
Glucose-Capillary: 136 mg/dL — ABNORMAL HIGH (ref 70–99)
Glucose-Capillary: 137 mg/dL — ABNORMAL HIGH (ref 70–99)
Glucose-Capillary: 145 mg/dL — ABNORMAL HIGH (ref 70–99)
Glucose-Capillary: 153 mg/dL — ABNORMAL HIGH (ref 70–99)
Glucose-Capillary: 93 mg/dL (ref 70–99)

## 2020-08-03 LAB — ECHO INTRAOPERATIVE TEE
Calc EF: 48.3 %
Height: 62 in
Single Plane A2C EF: 51.5 %
Single Plane A4C EF: 46.1 %
Weight: 3809.55 oz

## 2020-08-03 LAB — POCT I-STAT 7, (LYTES, BLD GAS, ICA,H+H)
Acid-Base Excess: 0 mmol/L (ref 0.0–2.0)
Acid-Base Excess: 0 mmol/L (ref 0.0–2.0)
Acid-base deficit: 4 mmol/L — ABNORMAL HIGH (ref 0.0–2.0)
Acid-base deficit: 3 mmol/L — ABNORMAL HIGH (ref 0.0–2.0)
Acid-base deficit: 2 mmol/L (ref 0.0–2.0)
Bicarbonate: 24.6 mmol/L (ref 20.0–28.0)
Bicarbonate: 24.7 mmol/L (ref 20.0–28.0)
Bicarbonate: 24.7 mmol/L (ref 20.0–28.0)
Bicarbonate: 22 mmol/L (ref 20.0–28.0)
Bicarbonate: 23.1 mmol/L (ref 20.0–28.0)
Calcium, Ion: 0.97 mmol/L — ABNORMAL LOW (ref 1.15–1.40)
Calcium, Ion: 1.1 mmol/L — ABNORMAL LOW (ref 1.15–1.40)
Calcium, Ion: 1.21 mmol/L (ref 1.15–1.40)
Calcium, Ion: 1.09 mmol/L — ABNORMAL LOW (ref 1.15–1.40)
Calcium, Ion: 1.15 mmol/L (ref 1.15–1.40)
HCT: 25 % — ABNORMAL LOW (ref 36.0–46.0)
HCT: 26 % — ABNORMAL LOW (ref 36.0–46.0)
HCT: 32 % — ABNORMAL LOW (ref 36.0–46.0)
HCT: 32 % — ABNORMAL LOW (ref 36.0–46.0)
HCT: 33 % — ABNORMAL LOW (ref 36.0–46.0)
Hemoglobin: 8.5 g/dL — ABNORMAL LOW (ref 12.0–15.0)
Hemoglobin: 8.8 g/dL — ABNORMAL LOW (ref 12.0–15.0)
Hemoglobin: 10.9 g/dL — ABNORMAL LOW (ref 12.0–15.0)
Hemoglobin: 10.9 g/dL — ABNORMAL LOW (ref 12.0–15.0)
Hemoglobin: 11.2 g/dL — ABNORMAL LOW (ref 12.0–15.0)
O2 Saturation: 100 %
O2 Saturation: 100 %
O2 Saturation: 98 %
O2 Saturation: 99 %
O2 Saturation: 99 %
Patient temperature: 35.7
Patient temperature: 36.6
Patient temperature: 36.9
Potassium: 3.2 mmol/L — ABNORMAL LOW (ref 3.5–5.1)
Potassium: 3 mmol/L — ABNORMAL LOW (ref 3.5–5.1)
Potassium: 3 mmol/L — ABNORMAL LOW (ref 3.5–5.1)
Potassium: 3.4 mmol/L — ABNORMAL LOW (ref 3.5–5.1)
Potassium: 3.6 mmol/L (ref 3.5–5.1)
Sodium: 135 mmol/L (ref 135–145)
Sodium: 134 mmol/L — ABNORMAL LOW (ref 135–145)
Sodium: 136 mmol/L (ref 135–145)
Sodium: 137 mmol/L (ref 135–145)
Sodium: 136 mmol/L (ref 135–145)
TCO2: 26 mmol/L (ref 22–32)
TCO2: 26 mmol/L (ref 22–32)
TCO2: 27 mmol/L (ref 22–32)
TCO2: 23 mmol/L (ref 22–32)
TCO2: 24 mmol/L (ref 22–32)
pCO2 arterial: 36.4 mmHg (ref 32.0–48.0)
pCO2 arterial: 41.3 mmHg (ref 32.0–48.0)
pCO2 arterial: 57.9 mmHg — ABNORMAL HIGH (ref 32.0–48.0)
pCO2 arterial: 36.4 mmHg (ref 32.0–48.0)
pCO2 arterial: 40.1 mmHg (ref 32.0–48.0)
pH, Arterial: 7.437 (ref 7.350–7.450)
pH, Arterial: 7.385 (ref 7.350–7.450)
pH, Arterial: 7.23 — ABNORMAL LOW (ref 7.350–7.450)
pH, Arterial: 7.388 (ref 7.350–7.450)
pH, Arterial: 7.369 (ref 7.350–7.450)
pO2, Arterial: 344 mmHg — ABNORMAL HIGH (ref 83.0–108.0)
pO2, Arterial: 314 mmHg — ABNORMAL HIGH (ref 83.0–108.0)
pO2, Arterial: 129 mmHg — ABNORMAL HIGH (ref 83.0–108.0)
pO2, Arterial: 145 mmHg — ABNORMAL HIGH (ref 83.0–108.0)
pO2, Arterial: 135 mmHg — ABNORMAL HIGH (ref 83.0–108.0)

## 2020-08-03 LAB — POCT I-STAT EG7
Acid-Base Excess: 0 mmol/L (ref 0.0–2.0)
Bicarbonate: 24.4 mmol/L (ref 20.0–28.0)
Calcium, Ion: 1.05 mmol/L — ABNORMAL LOW (ref 1.15–1.40)
HCT: 25 % — ABNORMAL LOW (ref 36.0–46.0)
Hemoglobin: 8.5 g/dL — ABNORMAL LOW (ref 12.0–15.0)
O2 Saturation: 64 %
Potassium: 4.6 mmol/L (ref 3.5–5.1)
Sodium: 135 mmol/L (ref 135–145)
TCO2: 26 mmol/L (ref 22–32)
pCO2, Ven: 38.7 mmHg — ABNORMAL LOW (ref 44.0–60.0)
pH, Ven: 7.408 (ref 7.250–7.430)
pO2, Ven: 33 mmHg (ref 32.0–45.0)

## 2020-08-03 LAB — BLOOD GAS, ARTERIAL
Acid-base deficit: 1.2 mmol/L (ref 0.0–2.0)
Bicarbonate: 22.8 mmol/L (ref 20.0–28.0)
FIO2: 21
O2 Saturation: 95.9 %
Patient temperature: 37
pCO2 arterial: 37.2 mmHg (ref 32.0–48.0)
pH, Arterial: 7.405 (ref 7.350–7.450)
pO2, Arterial: 81.9 mmHg — ABNORMAL LOW (ref 83.0–108.0)

## 2020-08-03 LAB — BASIC METABOLIC PANEL WITH GFR
Anion gap: 9 (ref 5–15)
BUN: 5 mg/dL — ABNORMAL LOW (ref 8–23)
CO2: 21 mmol/L — ABNORMAL LOW (ref 22–32)
Calcium: 8 mg/dL — ABNORMAL LOW (ref 8.9–10.3)
Chloride: 103 mmol/L (ref 98–111)
Creatinine, Ser: 0.6 mg/dL (ref 0.44–1.00)
GFR, Estimated: >60 mL/min
Glucose, Bld: 135 mg/dL — ABNORMAL HIGH (ref 70–99)
Potassium: 3.5 mmol/L (ref 3.5–5.1)
Sodium: 133 mmol/L — ABNORMAL LOW (ref 135–145)

## 2020-08-03 LAB — HEMOGLOBIN AND HEMATOCRIT, BLOOD
HCT: 25.6 % — ABNORMAL LOW (ref 36.0–46.0)
Hemoglobin: 8.6 g/dL — ABNORMAL LOW (ref 12.0–15.0)

## 2020-08-03 LAB — PROTIME-INR
INR: 1.3 — ABNORMAL HIGH (ref 0.8–1.2)
Prothrombin Time: 15.5 seconds — ABNORMAL HIGH (ref 11.4–15.2)

## 2020-08-03 LAB — PLATELET COUNT: Platelets: 304 10*3/uL (ref 150–400)

## 2020-08-03 LAB — ABO/RH: ABO/RH(D): B POS

## 2020-08-03 LAB — APTT: aPTT: 32 seconds (ref 24–36)

## 2020-08-03 LAB — MAGNESIUM: Magnesium: 2.6 mg/dL — ABNORMAL HIGH (ref 1.7–2.4)

## 2020-08-03 SURGERY — CORONARY ARTERY BYPASS GRAFTING (CABG)
Anesthesia: General | Site: Esophagus

## 2020-08-03 MED ORDER — SODIUM CHLORIDE 0.9 % IV SOLN: INTRAVENOUS | Status: DC

## 2020-08-03 MED ORDER — PROPOFOL 10 MG/ML IV BOLUS
INTRAVENOUS | Status: AC
  Filled 2020-08-03: qty 20

## 2020-08-03 MED ORDER — THROMBIN (RECOMBINANT) 20000 UNITS EX SOLR
CUTANEOUS | Status: AC
  Filled 2020-08-03: qty 20000

## 2020-08-03 MED ORDER — ALBUMIN HUMAN 5 % IV SOLN
250.0000 mL | INTRAVENOUS | Status: DC | PRN
  Administered 2020-08-03 – 2020-08-04 (×2): 12.5 g via INTRAVENOUS

## 2020-08-03 MED ORDER — SODIUM CHLORIDE 0.9 % IV SOLN
250.0000 mL | INTRAVENOUS | Status: DC
  Administered 2020-08-04: 250 mL via INTRAVENOUS

## 2020-08-03 MED ORDER — DOCUSATE SODIUM 100 MG PO CAPS
200.0000 mg | ORAL_CAPSULE | Freq: Every day | ORAL | Status: DC
  Administered 2020-08-04 – 2020-08-05 (×2): 200 mg via ORAL
  Filled 2020-08-03 (×2): qty 2

## 2020-08-03 MED ORDER — CHLORHEXIDINE GLUCONATE 0.12 % MT SOLN
15.0000 mL | OROMUCOSAL | Status: AC
  Administered 2020-08-03: 15 mL via OROMUCOSAL

## 2020-08-03 MED ORDER — LACTATED RINGERS IV SOLN: INTRAVENOUS | Status: DC

## 2020-08-03 MED ORDER — TRAMADOL HCL 50 MG PO TABS
50.0000 mg | ORAL_TABLET | ORAL | Status: DC | PRN
  Administered 2020-08-04: 100 mg via ORAL
  Filled 2020-08-03: qty 2

## 2020-08-03 MED ORDER — HEMOSTATIC AGENTS (NO CHARGE) OPTIME
TOPICAL | Status: DC | PRN
  Administered 2020-08-03: 1 via TOPICAL

## 2020-08-03 MED ORDER — ORAL CARE MOUTH RINSE
15.0000 mL | OROMUCOSAL | Status: DC
  Administered 2020-08-03 (×2): 15 mL via OROMUCOSAL

## 2020-08-03 MED ORDER — METOPROLOL TARTRATE 12.5 MG HALF TABLET
12.5000 mg | ORAL_TABLET | Freq: Two times a day (BID) | ORAL | Status: DC
  Administered 2020-08-04 (×2): 12.5 mg via ORAL
  Filled 2020-08-03 (×3): qty 1

## 2020-08-03 MED ORDER — CHLORHEXIDINE GLUCONATE 0.12 % MT SOLN: 15.0000 mL | Freq: Once | OROMUCOSAL | Status: AC

## 2020-08-03 MED ORDER — ASPIRIN 81 MG PO CHEW: 324.0000 mg | CHEWABLE_TABLET | Freq: Every day | ORAL | Status: DC

## 2020-08-03 MED ORDER — METOPROLOL TARTRATE 25 MG/10 ML ORAL SUSPENSION: 12.5000 mg | Freq: Two times a day (BID) | ORAL | Status: DC

## 2020-08-03 MED ORDER — SODIUM CHLORIDE 0.9% FLUSH: 3.0000 mL | INTRAVENOUS | Status: DC | PRN

## 2020-08-03 MED ORDER — SODIUM CHLORIDE 0.9% FLUSH
3.0000 mL | Freq: Two times a day (BID) | INTRAVENOUS | Status: DC
  Administered 2020-08-04 – 2020-08-05 (×3): 3 mL via INTRAVENOUS

## 2020-08-03 MED ORDER — CHLORHEXIDINE GLUCONATE CLOTH 2 % EX PADS
6.0000 | MEDICATED_PAD | Freq: Every day | CUTANEOUS | Status: DC
  Administered 2020-08-05: 6 via TOPICAL

## 2020-08-03 MED ORDER — POTASSIUM CHLORIDE 10 MEQ/50ML IV SOLN
10.0000 meq | INTRAVENOUS | Status: AC
Start: 2020-08-03 — End: 2020-08-03
  Administered 2020-08-03 (×3): 10 meq via INTRAVENOUS

## 2020-08-03 MED ORDER — BISACODYL 5 MG PO TBEC
10.0000 mg | DELAYED_RELEASE_TABLET | Freq: Every day | ORAL | Status: DC
  Administered 2020-08-04 – 2020-08-05 (×2): 10 mg via ORAL
  Filled 2020-08-03 (×2): qty 2

## 2020-08-03 MED ORDER — LACTATED RINGERS IV SOLN: INTRAVENOUS | Status: DC | PRN

## 2020-08-03 MED ORDER — MIDAZOLAM HCL 2 MG/2ML IJ SOLN: 2.0000 mg | INTRAMUSCULAR | Status: DC | PRN

## 2020-08-03 MED ORDER — 0.9 % SODIUM CHLORIDE (POUR BTL) OPTIME
TOPICAL | Status: DC | PRN
  Administered 2020-08-03: 5000 mL

## 2020-08-03 MED ORDER — CHLORHEXIDINE GLUCONATE 0.12 % MT SOLN
OROMUCOSAL | Status: AC
  Administered 2020-08-03: 15 mL via OROMUCOSAL
  Filled 2020-08-03: qty 15

## 2020-08-03 MED ORDER — SIMVASTATIN 20 MG PO TABS
40.0000 mg | ORAL_TABLET | Freq: Every day | ORAL | Status: DC
  Administered 2020-08-03 – 2020-08-08 (×6): 40 mg via ORAL
  Filled 2020-08-03 (×6): qty 2

## 2020-08-03 MED ORDER — HEPARIN SODIUM (PORCINE) 1000 UNIT/ML IJ SOLN
INTRAMUSCULAR | Status: AC
  Filled 2020-08-03: qty 1

## 2020-08-03 MED ORDER — SODIUM CHLORIDE 0.45 % IV SOLN: INTRAVENOUS | Status: DC | PRN

## 2020-08-03 MED ORDER — LACTATED RINGERS IV SOLN: 500.0000 mL | Freq: Once | INTRAVENOUS | Status: DC | PRN

## 2020-08-03 MED ORDER — CHLORHEXIDINE GLUCONATE 0.12% ORAL RINSE (MEDLINE KIT): 15.0000 mL | Freq: Two times a day (BID) | OROMUCOSAL | Status: DC

## 2020-08-03 MED ORDER — DEXMEDETOMIDINE HCL IN NACL 400 MCG/100ML IV SOLN
0.0000 ug/kg/h | INTRAVENOUS | Status: DC
  Administered 2020-08-03: 0.2 ug/kg/h via INTRAVENOUS
  Filled 2020-08-03: qty 100

## 2020-08-03 MED ORDER — THROMBIN 20000 UNITS EX SOLR: CUTANEOUS | Status: DC | PRN

## 2020-08-03 MED ORDER — METOPROLOL TARTRATE 5 MG/5ML IV SOLN: 2.5000 mg | INTRAVENOUS | Status: DC | PRN

## 2020-08-03 MED ORDER — HEPARIN SODIUM (PORCINE) 1000 UNIT/ML IJ SOLN
INTRAMUSCULAR | Status: DC | PRN
  Administered 2020-08-03: 33000 [IU] via INTRAVENOUS

## 2020-08-03 MED ORDER — ACETAMINOPHEN 500 MG PO TABS: 1000.0000 mg | ORAL_TABLET | Freq: Four times a day (QID) | ORAL | Status: DC | PRN

## 2020-08-03 MED ORDER — FENTANYL CITRATE (PF) 250 MCG/5ML IJ SOLN
INTRAMUSCULAR | Status: AC
  Filled 2020-08-03: qty 5

## 2020-08-03 MED ORDER — PANTOPRAZOLE SODIUM 40 MG PO TBEC
40.0000 mg | DELAYED_RELEASE_TABLET | Freq: Every day | ORAL | Status: DC
  Administered 2020-08-05: 40 mg via ORAL
  Filled 2020-08-03: qty 1

## 2020-08-03 MED ORDER — PHENYLEPHRINE HCL (PRESSORS) 10 MG/ML IV SOLN
INTRAVENOUS | Status: DC | PRN
  Administered 2020-08-03: 40 ug via INTRAVENOUS

## 2020-08-03 MED ORDER — METOPROLOL TARTRATE 12.5 MG HALF TABLET
ORAL_TABLET | ORAL | Status: AC
  Filled 2020-08-03: qty 1

## 2020-08-03 MED ORDER — CHLORHEXIDINE GLUCONATE CLOTH 2 % EX PADS
6.0000 | MEDICATED_PAD | Freq: Every day | CUTANEOUS | Status: DC
  Administered 2020-08-03 – 2020-08-05 (×3): 6 via TOPICAL

## 2020-08-03 MED ORDER — METOPROLOL TARTRATE 12.5 MG HALF TABLET: 12.5000 mg | ORAL_TABLET | Freq: Once | ORAL | Status: DC

## 2020-08-03 MED ORDER — MIDAZOLAM HCL 5 MG/5ML IJ SOLN
INTRAMUSCULAR | Status: DC | PRN
  Administered 2020-08-03: 4 mg via INTRAVENOUS
  Administered 2020-08-03 (×3): 2 mg via INTRAVENOUS

## 2020-08-03 MED ORDER — INSULIN REGULAR(HUMAN) IN NACL 100-0.9 UT/100ML-% IV SOLN: INTRAVENOUS | Status: DC

## 2020-08-03 MED ORDER — MORPHINE SULFATE (PF) 2 MG/ML IV SOLN
1.0000 mg | INTRAVENOUS | Status: DC | PRN
  Administered 2020-08-03: 2 mg via INTRAVENOUS
  Administered 2020-08-03: 1 mg via INTRAVENOUS
  Administered 2020-08-03: 4 mg via INTRAVENOUS
  Administered 2020-08-03 – 2020-08-04 (×2): 2 mg via INTRAVENOUS
  Administered 2020-08-04: 4 mg via INTRAVENOUS
  Filled 2020-08-03: qty 1
  Filled 2020-08-03: qty 2
  Filled 2020-08-03 (×2): qty 1
  Filled 2020-08-03: qty 2
  Filled 2020-08-03: qty 1

## 2020-08-03 MED ORDER — ASPIRIN EC 325 MG PO TBEC
325.0000 mg | DELAYED_RELEASE_TABLET | Freq: Every day | ORAL | Status: DC
  Administered 2020-08-04 – 2020-08-05 (×2): 325 mg via ORAL
  Filled 2020-08-03 (×2): qty 1

## 2020-08-03 MED ORDER — NITROGLYCERIN IN D5W 200-5 MCG/ML-% IV SOLN: 0.0000 ug/min | INTRAVENOUS | Status: DC

## 2020-08-03 MED ORDER — PLASMA-LYTE 148 IV SOLN
INTRAVENOUS | Status: DC | PRN
  Administered 2020-08-03: 500 mL

## 2020-08-03 MED ORDER — DEXTROSE 50 % IV SOLN: 0.0000 mL | INTRAVENOUS | Status: DC | PRN

## 2020-08-03 MED ORDER — POTASSIUM CHLORIDE 10 MEQ/50ML IV SOLN
10.0000 meq | INTRAVENOUS | Status: AC
  Administered 2020-08-03 (×3): 10 meq via INTRAVENOUS
  Filled 2020-08-03 (×3): qty 50

## 2020-08-03 MED ORDER — CHLORHEXIDINE GLUCONATE 4 % EX LIQD: 30.0000 mL | CUTANEOUS | Status: DC

## 2020-08-03 MED ORDER — POTASSIUM CHLORIDE 10 MEQ/50ML IV SOLN
10.0000 meq | INTRAVENOUS | Status: AC
  Administered 2020-08-03 (×2): 10 meq via INTRAVENOUS
  Filled 2020-08-03 (×2): qty 50

## 2020-08-03 MED ORDER — ONDANSETRON HCL 4 MG/2ML IJ SOLN
4.0000 mg | Freq: Four times a day (QID) | INTRAMUSCULAR | Status: DC | PRN
  Administered 2020-08-04 – 2020-08-05 (×3): 4 mg via INTRAVENOUS
  Filled 2020-08-03 (×3): qty 2

## 2020-08-03 MED ORDER — BISACODYL 10 MG RE SUPP: 10.0000 mg | Freq: Every day | RECTAL | Status: DC

## 2020-08-03 MED ORDER — PHENYLEPHRINE HCL-NACL 20-0.9 MG/250ML-% IV SOLN: 0.0000 ug/min | INTRAVENOUS | Status: DC

## 2020-08-03 MED ORDER — MIDAZOLAM HCL (PF) 10 MG/2ML IJ SOLN
INTRAMUSCULAR | Status: AC
  Filled 2020-08-03: qty 2

## 2020-08-03 MED ORDER — FENTANYL CITRATE (PF) 250 MCG/5ML IJ SOLN
INTRAMUSCULAR | Status: AC
  Filled 2020-08-03: qty 20

## 2020-08-03 MED ORDER — CEFUROXIME SODIUM 1.5 G IV SOLR
1.5000 g | Freq: Two times a day (BID) | INTRAVENOUS | Status: AC
  Administered 2020-08-03 – 2020-08-05 (×4): 1.5 g via INTRAVENOUS
  Filled 2020-08-03 (×4): qty 1.5

## 2020-08-03 MED ORDER — ALBUMIN HUMAN 5 % IV SOLN: INTRAVENOUS | Status: DC | PRN

## 2020-08-03 MED ORDER — SODIUM CHLORIDE 0.9% FLUSH
10.0000 mL | Freq: Two times a day (BID) | INTRAVENOUS | Status: DC
  Administered 2020-08-03 – 2020-08-04 (×4): 10 mL
  Administered 2020-08-05: 3 mL

## 2020-08-03 MED ORDER — PROTAMINE SULFATE 10 MG/ML IV SOLN
INTRAVENOUS | Status: DC | PRN
  Administered 2020-08-03: 300 mg via INTRAVENOUS

## 2020-08-03 MED ORDER — PROPOFOL 10 MG/ML IV BOLUS
INTRAVENOUS | Status: DC | PRN
  Administered 2020-08-03: 20 mg via INTRAVENOUS
  Administered 2020-08-03: 150 mg via INTRAVENOUS

## 2020-08-03 MED ORDER — ROCURONIUM BROMIDE 10 MG/ML (PF) SYRINGE
PREFILLED_SYRINGE | INTRAVENOUS | Status: DC | PRN
  Administered 2020-08-03: 60 mg via INTRAVENOUS
  Administered 2020-08-03: 20 mg via INTRAVENOUS
  Administered 2020-08-03: 40 mg via INTRAVENOUS

## 2020-08-03 MED ORDER — SODIUM CHLORIDE 0.9% FLUSH: 10.0000 mL | INTRAVENOUS | Status: DC | PRN

## 2020-08-03 MED ORDER — THROMBIN 20000 UNITS EX SOLR
CUTANEOUS | Status: DC | PRN
  Administered 2020-08-03 (×3): 4 mL via TOPICAL

## 2020-08-03 MED ORDER — ORAL CARE MOUTH RINSE
15.0000 mL | Freq: Two times a day (BID) | OROMUCOSAL | Status: DC
  Administered 2020-08-04 – 2020-08-09 (×6): 15 mL via OROMUCOSAL

## 2020-08-03 MED ORDER — STERILE WATER FOR IRRIGATION IR SOLN
Status: DC | PRN
  Administered 2020-08-03: 2000 mL

## 2020-08-03 MED ORDER — FENTANYL CITRATE (PF) 250 MCG/5ML IJ SOLN
INTRAMUSCULAR | Status: DC | PRN
  Administered 2020-08-03 (×3): 100 ug via INTRAVENOUS
  Administered 2020-08-03: 200 ug via INTRAVENOUS
  Administered 2020-08-03: 50 ug via INTRAVENOUS
  Administered 2020-08-03: 100 ug via INTRAVENOUS
  Administered 2020-08-03: 150 ug via INTRAVENOUS
  Administered 2020-08-03 (×4): 100 ug via INTRAVENOUS
  Administered 2020-08-03: 50 ug via INTRAVENOUS

## 2020-08-03 MED ORDER — MAGNESIUM SULFATE 4 GM/100ML IV SOLN
4.0000 g | Freq: Once | INTRAVENOUS | Status: AC
  Administered 2020-08-03: 4 g via INTRAVENOUS
  Filled 2020-08-03: qty 100

## 2020-08-03 MED ORDER — VANCOMYCIN HCL IN DEXTROSE 1-5 GM/200ML-% IV SOLN
1000.0000 mg | Freq: Once | INTRAVENOUS | Status: AC
  Administered 2020-08-03: 1000 mg via INTRAVENOUS
  Filled 2020-08-03: qty 200

## 2020-08-03 MED ORDER — FAMOTIDINE IN NACL 20-0.9 MG/50ML-% IV SOLN
20.0000 mg | Freq: Two times a day (BID) | INTRAVENOUS | Status: AC
  Administered 2020-08-03: 20 mg via INTRAVENOUS
  Filled 2020-08-03 (×2): qty 50

## 2020-08-03 MED ORDER — OXYCODONE HCL 5 MG PO TABS
5.0000 mg | ORAL_TABLET | ORAL | Status: DC | PRN
  Administered 2020-08-03 – 2020-08-05 (×6): 10 mg via ORAL
  Filled 2020-08-03 (×6): qty 2

## 2020-08-03 SURGICAL SUPPLY — 99 items
BAG DECANTER FOR FLEXI CONT (MISCELLANEOUS) ×3 IMPLANT
BLADE CLIPPER SURG (BLADE) ×3 IMPLANT
BLADE STERNUM SYSTEM 6 (BLADE) ×3 IMPLANT
BNDG ELASTIC 4X5.8 VLCR STR LF (GAUZE/BANDAGES/DRESSINGS) ×3 IMPLANT
BNDG ELASTIC 6X5.8 VLCR STR LF (GAUZE/BANDAGES/DRESSINGS) ×3 IMPLANT
BNDG GAUZE ELAST 4 BULKY (GAUZE/BANDAGES/DRESSINGS) ×3 IMPLANT
CANISTER SUCT 3000ML PPV (MISCELLANEOUS) ×3 IMPLANT
CATH ROBINSON RED A/P 18FR (CATHETERS) ×6 IMPLANT
CATH THORACIC 28FR (CATHETERS) ×3 IMPLANT
CATH THORACIC 36FR (CATHETERS) ×3 IMPLANT
CATH THORACIC 36FR RT ANG (CATHETERS) ×3 IMPLANT
CLIP VESOCCLUDE MED 24/CT (CLIP) IMPLANT
CLIP VESOCCLUDE SM WIDE 24/CT (CLIP) ×3 IMPLANT
DRAPE CARDIOVASCULAR INCISE (DRAPES) ×3
DRAPE SLUSH/WARMER DISC (DRAPES) ×3 IMPLANT
DRAPE SRG 135X102X78XABS (DRAPES) ×2 IMPLANT
DRSG AQUACEL AG ADV 3.5X14 (GAUZE/BANDAGES/DRESSINGS) ×3 IMPLANT
DRSG COVADERM 4X14 (GAUZE/BANDAGES/DRESSINGS) IMPLANT
ELECT BLADE 4.0 EZ CLEAN MEGAD (MISCELLANEOUS) ×3
ELECT CAUTERY BLADE 6.4 (BLADE) ×3 IMPLANT
ELECT REM PT RETURN 9FT ADLT (ELECTROSURGICAL) ×6
ELECTRODE BLDE 4.0 EZ CLN MEGD (MISCELLANEOUS) ×2 IMPLANT
ELECTRODE REM PT RTRN 9FT ADLT (ELECTROSURGICAL) ×4 IMPLANT
FELT TEFLON 1X6 (MISCELLANEOUS) ×3 IMPLANT
GAUZE SPONGE 4X4 12PLY STRL (GAUZE/BANDAGES/DRESSINGS) ×3 IMPLANT
GLOVE BIO SURGEON STRL SZ 6 (GLOVE) ×3 IMPLANT
GLOVE BIO SURGEON STRL SZ 6.5 (GLOVE) ×12 IMPLANT
GLOVE BIO SURGEON STRL SZ7 (GLOVE) IMPLANT
GLOVE BIO SURGEON STRL SZ7.5 (GLOVE) IMPLANT
GLOVE BIOGEL PI IND STRL 6 (GLOVE) IMPLANT
GLOVE BIOGEL PI IND STRL 6.5 (GLOVE) IMPLANT
GLOVE BIOGEL PI IND STRL 7.0 (GLOVE) IMPLANT
GLOVE BIOGEL PI INDICATOR 6 (GLOVE)
GLOVE BIOGEL PI INDICATOR 6.5 (GLOVE)
GLOVE BIOGEL PI INDICATOR 7.0 (GLOVE)
GLOVE ORTHO TXT STRL SZ7.5 (GLOVE) IMPLANT
GLOVE TRIUMPH SURG SIZE 7.0 (KITS) ×6 IMPLANT
GOWN STRL REUS W/ TWL LRG LVL3 (GOWN DISPOSABLE) ×8 IMPLANT
GOWN STRL REUS W/ TWL XL LVL3 (GOWN DISPOSABLE) ×2 IMPLANT
GOWN STRL REUS W/TWL LRG LVL3 (GOWN DISPOSABLE) ×12
GOWN STRL REUS W/TWL XL LVL3 (GOWN DISPOSABLE) ×3
HEMOSTAT POWDER SURGIFOAM 1G (HEMOSTASIS) ×9 IMPLANT
HEMOSTAT SURGICEL 2X14 (HEMOSTASIS) ×3 IMPLANT
INSERT FOGARTY 61MM (MISCELLANEOUS) IMPLANT
INSERT FOGARTY XLG (MISCELLANEOUS) IMPLANT
KIT BASIN OR (CUSTOM PROCEDURE TRAY) ×3 IMPLANT
KIT CATH CPB BARTLE (MISCELLANEOUS) ×3 IMPLANT
KIT SUCTION CATH 14FR (SUCTIONS) ×3 IMPLANT
KIT TURNOVER KIT B (KITS) ×3 IMPLANT
KIT VASOVIEW HEMOPRO 2 VH 4000 (KITS) IMPLANT
NS IRRIG 1000ML POUR BTL (IV SOLUTION) ×15 IMPLANT
PACK E OPEN HEART (SUTURE) ×3 IMPLANT
PACK OPEN HEART (CUSTOM PROCEDURE TRAY) ×3 IMPLANT
PAD ARMBOARD 7.5X6 YLW CONV (MISCELLANEOUS) ×6 IMPLANT
PAD ELECT DEFIB RADIOL ZOLL (MISCELLANEOUS) ×3 IMPLANT
PENCIL BUTTON HOLSTER BLD 10FT (ELECTRODE) IMPLANT
POSITIONER HEAD DONUT 9IN (MISCELLANEOUS) ×3 IMPLANT
PUNCH AORTIC ROTATE 4.0MM (MISCELLANEOUS) IMPLANT
PUNCH AORTIC ROTATE 4.5MM 8IN (MISCELLANEOUS) ×3 IMPLANT
PUNCH AORTIC ROTATE 5MM 8IN (MISCELLANEOUS) IMPLANT
SET CARDIOPLEGIA MPS 5001102 (MISCELLANEOUS) ×3 IMPLANT
SPONGE INTESTINAL PEANUT (DISPOSABLE) IMPLANT
SPONGE LAP 18X18 RF (DISPOSABLE) IMPLANT
SPONGE LAP 4X18 RFD (DISPOSABLE) ×3 IMPLANT
SUPPORT HEART JANKE-BARRON (MISCELLANEOUS) ×3 IMPLANT
SUT BONE WAX W31G (SUTURE) ×3 IMPLANT
SUT MNCRL AB 4-0 PS2 18 (SUTURE) IMPLANT
SUT PROLENE 3 0 SH DA (SUTURE) IMPLANT
SUT PROLENE 3 0 SH1 36 (SUTURE) ×3 IMPLANT
SUT PROLENE 4 0 RB 1 (SUTURE)
SUT PROLENE 4 0 SH DA (SUTURE) IMPLANT
SUT PROLENE 4-0 RB1 .5 CRCL 36 (SUTURE) IMPLANT
SUT PROLENE 5 0 C 1 36 (SUTURE) IMPLANT
SUT PROLENE 6 0 C 1 30 (SUTURE) ×6 IMPLANT
SUT PROLENE 7 0 BV 1 (SUTURE) IMPLANT
SUT PROLENE 7 0 BV1 MDA (SUTURE) ×3 IMPLANT
SUT PROLENE 8 0 BV175 6 (SUTURE) IMPLANT
SUT SILK  1 MH (SUTURE)
SUT SILK 1 MH (SUTURE) IMPLANT
SUT SILK 2 0 SH (SUTURE) IMPLANT
SUT STEEL STERNAL CCS#1 18IN (SUTURE) IMPLANT
SUT STEEL SZ 6 DBL 3X14 BALL (SUTURE) IMPLANT
SUT VIC AB 1 CTX 36 (SUTURE) ×6
SUT VIC AB 1 CTX36XBRD ANBCTR (SUTURE) ×4 IMPLANT
SUT VIC AB 2-0 CT1 27 (SUTURE)
SUT VIC AB 2-0 CT1 TAPERPNT 27 (SUTURE) IMPLANT
SUT VIC AB 2-0 CTX 27 (SUTURE) IMPLANT
SUT VIC AB 3-0 SH 27 (SUTURE)
SUT VIC AB 3-0 SH 27X BRD (SUTURE) IMPLANT
SUT VIC AB 3-0 X1 27 (SUTURE) IMPLANT
SUT VICRYL 4-0 PS2 18IN ABS (SUTURE) IMPLANT
SYSTEM SAHARA CHEST DRAIN ATS (WOUND CARE) ×3 IMPLANT
TAPE CLOTH SURG 4X10 WHT LF (GAUZE/BANDAGES/DRESSINGS) ×3 IMPLANT
TOWEL GREEN STERILE (TOWEL DISPOSABLE) ×3 IMPLANT
TOWEL GREEN STERILE FF (TOWEL DISPOSABLE) ×3 IMPLANT
TRAY FOLEY SLVR 16FR TEMP STAT (SET/KITS/TRAYS/PACK) IMPLANT
TUBING LAP HI FLOW INSUFFLATIO (TUBING) ×3 IMPLANT
UNDERPAD 30X36 HEAVY ABSORB (UNDERPADS AND DIAPERS) ×3 IMPLANT
WATER STERILE IRR 1000ML POUR (IV SOLUTION) ×6 IMPLANT

## 2020-08-03 NOTE — Progress Notes (Signed)
Dr. Cyndia Bent placed suture at top of sternal incision under sterile technique.

## 2020-08-03 NOTE — Anesthesia Procedure Notes (Signed)
Procedure Name: Intubation Date/Time: 08/03/2020 8:03 AM Performed by: Candis Shine, CRNA Pre-anesthesia Checklist: Patient identified, Emergency Drugs available, Suction available and Patient being monitored Patient Re-evaluated:Patient Re-evaluated prior to induction Oxygen Delivery Method: Circle System Utilized Preoxygenation: Pre-oxygenation with 100% oxygen Induction Type: IV induction Ventilation: Oral airway inserted - appropriate to patient size Laryngoscope Size: Sabra Heck and 3 Grade View: Grade I Tube type: Oral Tube size: 7.5 mm Number of attempts: 1 Airway Equipment and Method: Stylet and Oral airway Placement Confirmation: ETT inserted through vocal cords under direct vision,  positive ETCO2 and breath sounds checked- equal and bilateral Secured at: 23 cm Tube secured with: Tape Dental Injury: Teeth and Oropharynx as per pre-operative assessment

## 2020-08-03 NOTE — Plan of Care (Signed)
  Problem: Education: Goal: Knowledge of General Education information will improve Description: Including pain rating scale, medication(s)/side effects and non-pharmacologic comfort measures Outcome: Progressing   Problem: Health Behavior/Discharge Planning: Goal: Ability to manage health-related needs will improve Outcome: Progressing   Problem: Clinical Measurements: Goal: Ability to maintain clinical measurements within normal limits will improve Outcome: Progressing Goal: Will remain free from infection Outcome: Progressing   Problem: Elimination: Goal: Will not experience complications related to urinary retention Outcome: Progressing   Problem: Skin Integrity: Goal: Risk for impaired skin integrity will decrease Outcome: Progressing

## 2020-08-03 NOTE — Op Note (Signed)
CARDIOVASCULAR SURGERY OPERATIVE NOTE  08/03/2020  Surgeon:  Gaye Pollack, MD  First Assistant: Jadene Pierini,  PA-C   Preoperative Diagnosis:  Severe single-vessel coronary artery disease   Postoperative Diagnosis:  Same   Procedure:  1. Median Sternotomy 2. Extracorporeal circulation 3.   Coronary artery bypass grafting x 1   Left internal mammary artery graft to the LAD     Anesthesia:  General Endotracheal   Clinical History/Surgical Indication:  This 69 year old woman has progressive exertional fatigue, shortness of breath, and substernal chest discomfort with high-grade sequential LAD stenosis. There is a large vessel that wraps around the apex. She has an abnormal nuclear scan showing apical inferior and apical scar or ischemia and I suspect that her symptoms are related to her LAD stenosis. She does have a long history of heavy smoking but PFTs are showing no significant obstructive or restrictive disease and her arterial blood gas is unremarkable. Her proximal and mid LAD are heavily calcified and I agree that coronary artery bypass graft surgery is the best treatment to revascularize her heart. I discussed the operative procedure with the patient andand her husbandincluding alternatives, benefits and risks; including but not limited to bleeding, blood transfusion, infection, stroke, myocardial infarction, graft failure, heart block requiring a permanent pacemaker, organ dysfunction, and death.Jomarie M Mancusounderstands and agrees to proceed.     Preparation:  The patient was seen in the preoperative holding area and the correct patient, correct operation were confirmed with the patient after reviewing the medical record and catheterization. The consent was signed by me. Preoperative antibiotics were given. A pulmonary arterial line and radial arterial line were  placed by the anesthesia team. The patient was taken back to the operating room and positioned supine on the operating room table. After being placed under general endotracheal anesthesia by the anesthesia team a foley catheter was placed. The neck, chest, abdomen, and both legs were prepped with betadine soap and solution and draped in the usual sterile manner. A surgical time-out was taken and the correct patient and operative procedure were confirmed with the nursing and anesthesia staff.   Cardiopulmonary Bypass:  A median sternotomy was performed. The pericardium was opened in the midline. Right ventricular function appeared normal. The ascending aorta was of normal size and had some palpable plaque adjacent to the innominate artery. There were no contraindications to aortic cannulation or cross-clamping. The heart was lying towards the left chest almost in a sideways position making exposure of the LAD difficult. With her morbid obesity I did not think off pump surgery was indicated. The patient was fully systemically heparinized and the ACT was maintained > 400 sec. The proximal aortic arch was cannulated with a 20 F aortic cannula for arterial inflow. Venous cannulation was performed via the right atrial appendage using a two-staged venous cannula. An antegrade cardioplegia/vent cannula was inserted into the mid-ascending aorta. Aortic occlusion was performed with a single cross-clamp. Systemic temperature drifted down to 35 degrees Centrigrade and she was warmed back up to 37. Topical cooling of the heart with iced saline were used. Hyperkalemic antegrade cold blood cardioplegia was used to induce diastolic arrest. A temperature probe was inserted into the interventricular septum and an insulating pad was placed in the pericardium.   Left internal mammary artery harvest:  The left side of the sternum was retracted using the Rultract retractor. The left internal mammary artery was harvested as a  pedicle graft. All side branches were clipped. It was a medium-sized vessel  of good quality with excellent blood flow. It was ligated distally and divided. It was sprayed with topical papaverine solution to prevent vasospasm.   Coronary arteries:  The coronary arteries were examined.   LAD:  Intramyocardial in proximal and mid portions but the distal vessel was a large graftible vessel with no disease. Diagonals were small non-graftible vessels.    Grafts:  1. LIMA to the LAD: 2.5 mm. It was sewn end to side using 8-0 prolene continuous suture.   Completion:  The patient was rewarmed to 37 degrees Centigrade. The clamp was removed from the LIMA pedicle and there was rapid warming of the septum and return of ventricular fibrillation. The crossclamp was removed with a time of 30 minutes. There was spontaneous return of sinus rhythm. The distal and proximal anastomoses were checked for hemostasis. The position of the grafts was satisfactory. Two temporary epicardial pacing wires were placed on the right atrium and two on the right ventricle. The patient was weaned from CPB without difficulty on no inotropes. CPB time was 41 minutes. Cardiac output was 5 LPM. Heparin was fully reversed with protamine and the aortic and venous cannulas removed. Hemostasis was achieved. Mediastinal and left pleural drainage tubes were placed. The sternum was closed with double #6 stainless steel wires. The fascia was closed with continuous # 1 vicryl suture. The subcutaneous tissue was closed with 2-0 vicryl continuous suture. The skin was closed with 3-0 vicryl subcuticular suture. All sponge, needle, and instrument counts were reported correct at the end of the case. Dry sterile dressings were placed over the incisions and around the chest tubes which were connected to pleurevac suction. The patient was then transported to the surgical intensive care unit in stable condition.

## 2020-08-03 NOTE — Progress Notes (Signed)
  Echocardiogram Echocardiogram Transesophageal has been performed.  [name] 08/03/2020, 9:07 AM

## 2020-08-03 NOTE — Anesthesia Procedure Notes (Signed)
Central Venous Catheter Insertion Performed by: Roberts Gaudy, MD, anesthesiologist Start/End12/14/2021 6:40 AM, 08/03/2020 6:50 AM Patient location: Pre-op. Preanesthetic checklist: patient identified, IV checked, site marked, risks and benefits discussed, surgical consent, monitors and equipment checked, pre-op evaluation, timeout performed and anesthesia consent Hand hygiene performed  and maximum sterile barriers used  PA cath was placed.Swan type:thermodilution Procedure performed without using ultrasound guided technique. Attempts: 1 Following insertion, line sutured, dressing applied and Biopatch. Post procedure assessment: blood return through all ports and free fluid flow  Patient tolerated the procedure well with no immediate complications.

## 2020-08-03 NOTE — Interval H&P Note (Signed)
History and Physical Interval Note:  08/03/2020 6:46 AM  [name]  has presented today for surgery, with the diagnosis of CAD.  The various methods of treatment have been discussed with the patient and family. After consideration of risks, benefits and other options for treatment, the patient has consented to  Procedure(s): CORONARY ARTERY BYPASS GRAFTING (CABG) (N/A) TRANSESOPHAGEAL ECHOCARDIOGRAM (TEE) (N/A) as a surgical intervention.  The patient's history has been reviewed, patient examined, no change in status, stable for surgery.  I have reviewed the patient's chart and labs.  Questions were answered to the patient's satisfaction.     Gaye Pollack

## 2020-08-03 NOTE — Transfer of Care (Signed)
Immediate Anesthesia Transfer of Care Note  Patient: [name]  Procedure(s) Performed: CORONARY ARTERY BYPASS GRAFTING TIMES ONE USING LEFT INTERNAL MAMMARY ARTERY. (N/A Chest) TRANSESOPHAGEAL ECHOCARDIOGRAM (TEE) (N/A Esophagus)  Patient Location: ICU  Anesthesia Type:General  Level of Consciousness: sedated and Patient remains intubated per anesthesia plan  Airway & Oxygen Therapy: Patient remains intubated per anesthesia plan and Patient placed on Ventilator (see vital sign flow sheet for setting)  Post-op Assessment: Report given to RN and Post -op Vital signs reviewed and stable  Post vital signs: Reviewed and stable  Last Vitals:  Vitals Value Taken Time  BP 100/68 08/03/20 1217  Temp    Pulse 79 08/03/20 1220  Resp 15 08/03/20 1220  SpO2 99 % 08/03/20 1220  Vitals shown include unvalidated device data.  Last Pain:  Vitals:   08/03/20 0633  TempSrc:   PainSc: 0-No pain      Patients Stated Pain Goal: 3 (12/29/00 1117)  Complications: No complications documented.

## 2020-08-03 NOTE — Anesthesia Preprocedure Evaluation (Signed)
Anesthesia Evaluation  Patient identified by MRN, date of birth, ID band Patient awake    Reviewed: Allergy & Precautions, NPO status , Patient's Chart, lab work & pertinent test results  Airway Mallampati: III  TM Distance: >3 FB Neck ROM: Full    Dental  (+) Edentulous Upper, Edentulous Lower   Pulmonary former smoker,    breath sounds clear to auscultation       Cardiovascular hypertension,  Rhythm:Regular Rate:Normal     Neuro/Psych    GI/Hepatic   Endo/Other  diabetes  Renal/GU      Musculoskeletal   Abdominal (+) + obese,   Peds  Hematology   Anesthesia Other Findings   Reproductive/Obstetrics                             Anesthesia Physical Anesthesia Plan  ASA: III  Anesthesia Plan: General   Post-op Pain Management:    Induction: Intravenous  PONV Risk Score and Plan: Ondansetron and Dexamethasone  Airway Management Planned: Oral ETT  Additional Equipment: Arterial line, PA Cath, 3D TEE, Ultrasound Guidance Line Placement and CVP  Intra-op Plan:   Post-operative Plan: Extubation in OR  Informed Consent: I have reviewed the patients History and Physical, chart, labs and discussed the procedure including the risks, benefits and alternatives for the proposed anesthesia with the patient or authorized representative who has indicated his/her understanding and acceptance.       Plan Discussed with: Anesthesiologist and CRNA  Anesthesia Plan Comments:         Anesthesia Quick Evaluation

## 2020-08-03 NOTE — Anesthesia Postprocedure Evaluation (Signed)
Anesthesia Post Note  Patient: SOLARIS KRAM  Procedure(s) Performed: CORONARY ARTERY BYPASS GRAFTING TIMES ONE USING LEFT INTERNAL MAMMARY ARTERY. (N/A Chest) TRANSESOPHAGEAL ECHOCARDIOGRAM (TEE) (N/A Esophagus)     Patient location during evaluation: SICU Anesthesia Type: General Level of consciousness: sedated and patient remains intubated per anesthesia plan Pain management: pain level controlled Vital Signs Assessment: post-procedure vital signs reviewed and stable Respiratory status: patient remains intubated per anesthesia plan and patient on ventilator - see flowsheet for VS Cardiovascular status: stable Anesthetic complications: no   No complications documented.  Last Vitals:  Vitals:   08/03/20 1315 08/03/20 1317  BP:    Pulse: 89 97  Resp: (!) 25 (!) 21  Temp: (!) 35.8 C (!) 35.7 C  SpO2: 100% 100%    Last Pain:  Vitals:   08/03/20 1300  TempSrc: Core  PainSc:                  Nehan Flaum COKER

## 2020-08-03 NOTE — Anesthesia Procedure Notes (Signed)
Arterial Line Insertion Start/End12/14/2021 6:41 AM, 08/03/2020 7:05 AM Performed by: Roberts Gaudy, MD, Karlo Goeden, Rebeca Alert, CRNA, CRNA  Patient location: Pre-op. Preanesthetic checklist: patient identified, IV checked, site marked, risks and benefits discussed, surgical consent, monitors and equipment checked, pre-op evaluation, timeout performed and anesthesia consent Lidocaine 1% used for infiltration Left, radial was placed Catheter size: 20 Fr Hand hygiene performed  and maximum sterile barriers used   Attempts: 1 Procedure performed without using ultrasound guided technique. Ultrasound Notes:anatomy identified Following insertion, dressing applied and Biopatch. Post procedure assessment: normal and unchanged  Patient tolerated the procedure well with no immediate complications.

## 2020-08-03 NOTE — Anesthesia Procedure Notes (Signed)
Central Venous Catheter Insertion Performed by: Roberts Gaudy, MD, anesthesiologist Start/End12/14/2021 6:40 AM, 08/03/2020 6:50 AM Patient location: Pre-op. Preanesthetic checklist: patient identified, IV checked, site marked, risks and benefits discussed, surgical consent, monitors and equipment checked, pre-op evaluation, timeout performed and anesthesia consent Lidocaine 1% used for infiltration and patient sedated Hand hygiene performed  and maximum sterile barriers used  Catheter size: 9 Fr Sheath introducer Procedure performed using ultrasound guided technique. Ultrasound Notes:anatomy identified, needle tip was noted to be adjacent to the nerve/plexus identified, no ultrasound evidence of intravascular and/or intraneural injection and image(s) printed for medical record Attempts: 1 Following insertion, line sutured and dressing applied. Post procedure assessment: blood return through all ports, free fluid flow and no air  Patient tolerated the procedure well with no immediate complications.

## 2020-08-03 NOTE — Brief Op Note (Signed)
08/03/2020  2:43 PM  PATIENT:  [name]  69 y.o. female  PRE-OPERATIVE DIAGNOSIS:  CAD  POST-OPERATIVE DIAGNOSIS:  CAD  PROCEDURE:  Procedure(s): CORONARY ARTERY BYPASS GRAFTING TIMES ONE USING LEFT INTERNAL MAMMARY ARTERY. (N/A) TRANSESOPHAGEAL ECHOCARDIOGRAM (TEE) (N/A) LIMA-LAD  SURGEON:  Surgeon(s) and Role:    * Bartle, Fernande Boyden, MD - Primary  PHYSICIAN ASSISTANT: Burtis Imhoff PA-C  ASSISTANTS: STAFF   ANESTHESIA:   general  EBL:  600 mL   BLOOD ADMINISTERED:none  DRAINS: MEDIASTINAL CHEST TUBES   LOCAL MEDICATIONS USED:  NONE  SPECIMEN:  No Specimen  DISPOSITION OF SPECIMEN:  N/A  COUNTS:  YES  TOURNIQUET:  * No tourniquets in log *  DICTATION: .Dragon Dictation  PLAN OF CARE: Admit to inpatient   PATIENT DISPOSITION:  ICU - intubated and hemodynamically stable.   Delay start of Pharmacological VTE agent (>24hrs) due to surgical blood loss or risk of bleeding: yes  COMPLICATIONS: NO KNOWN

## 2020-08-03 NOTE — Progress Notes (Signed)
T[facility] Evening Rounds DOS s/p CABG x 1 Hemodyn stable  CI>2 NSR BP 116/74 (BP Location: Right Arm)   Pulse 86   Temp 98.24 F (36.8 C) (Core)   Resp (!) 23   Ht 5\' 2"  (1.575 m)   Wt (P) 108 kg   SpO2 100%   BMI (P) 43.55 kg/m  Waking slowly  Intake/Output Summary (Last 24 hours) at 08/03/2020 1627 Last data filed at 08/03/2020 1600 Gross per 24 hour  Intake 3196.18 ml  Output 1975 ml  Net 1221.18 ml   A/p: awake to extubate Routine post-CABG care. Milley Vining Z. Orvan Seen, [place]

## 2020-08-03 NOTE — Progress Notes (Signed)
RT note- ABG obtained, shown to Dr. Cyndia Bent, increased RR and VT.

## 2020-08-03 NOTE — Procedures (Signed)
Extubation Procedure Note  Patient Details:   Name: NELY DEDMON DOB: 05-19-1951 MRN: [id number]   Airway Documentation:    Vent end date: 08/03/20 Vent end time: 1546   Evaluation  O2 sats: stable throughout Complications: No apparent complications Patient did tolerate procedure well. Bilateral Breath Sounds: Clear,Diminished   Yes, pt could speak post extubation.  Pt extubated to 4 l/m [place] without difficulty, per protocol.  Earney Navy 08/03/2020, 3:47 PM

## 2020-08-03 NOTE — Progress Notes (Signed)
Notified Bartle MD of ooze from top of sternal incision. Ordered to monitor output, and if dressing becomes saturated then to change it.

## 2020-08-04 ENCOUNTER — Inpatient Hospital Stay (HOSPITAL_COMMUNITY): Payer: Medicare HMO

## 2020-08-04 ENCOUNTER — Encounter (HOSPITAL_COMMUNITY): Payer: Self-pay | Admitting: Surgery

## 2020-08-04 LAB — BASIC METABOLIC PANEL
Anion gap: 8 (ref 5–15)
Anion gap: 8 (ref 5–15)
BUN: <5 mg/dL — ABNORMAL LOW (ref 8–23)
BUN: 5 mg/dL — ABNORMAL LOW (ref 8–23)
CO2: 23 mmol/L (ref 22–32)
CO2: 23 mmol/L (ref 22–32)
Calcium: 7.7 mg/dL — ABNORMAL LOW (ref 8.9–10.3)
Calcium: 7.8 mg/dL — ABNORMAL LOW (ref 8.9–10.3)
Chloride: 100 mmol/L (ref 98–111)
Chloride: 101 mmol/L (ref 98–111)
Creatinine, Ser: 0.58 mg/dL (ref 0.44–1.00)
Creatinine, Ser: 0.53 mg/dL (ref 0.44–1.00)
GFR, Estimated: >60 mL/min (ref >60)
GFR, Estimated: >60 mL/min (ref >60)
Glucose, Bld: 127 mg/dL — ABNORMAL HIGH (ref 70–99)
Glucose, Bld: 128 mg/dL — ABNORMAL HIGH (ref 70–99)
Potassium: 3.7 mmol/L (ref 3.5–5.1)
Potassium: 4 mmol/L (ref 3.5–5.1)
Sodium: 131 mmol/L — ABNORMAL LOW (ref 135–145)
Sodium: 132 mmol/L — ABNORMAL LOW (ref 135–145)

## 2020-08-04 LAB — GLUCOSE, CAPILLARY
Glucose-Capillary: 108 mg/dL — ABNORMAL HIGH (ref 70–99)
Glucose-Capillary: 109 mg/dL — ABNORMAL HIGH (ref 70–99)
Glucose-Capillary: 112 mg/dL — ABNORMAL HIGH (ref 70–99)
Glucose-Capillary: 115 mg/dL — ABNORMAL HIGH (ref 70–99)
Glucose-Capillary: 115 mg/dL — ABNORMAL HIGH (ref 70–99)
Glucose-Capillary: 116 mg/dL — ABNORMAL HIGH (ref 70–99)
Glucose-Capillary: 118 mg/dL — ABNORMAL HIGH (ref 70–99)
Glucose-Capillary: 120 mg/dL — ABNORMAL HIGH (ref 70–99)
Glucose-Capillary: 123 mg/dL — ABNORMAL HIGH (ref 70–99)
Glucose-Capillary: 125 mg/dL — ABNORMAL HIGH (ref 70–99)
Glucose-Capillary: 126 mg/dL — ABNORMAL HIGH (ref 70–99)
Glucose-Capillary: 128 mg/dL — ABNORMAL HIGH (ref 70–99)
Glucose-Capillary: 144 mg/dL — ABNORMAL HIGH (ref 70–99)

## 2020-08-04 LAB — MAGNESIUM
Magnesium: 2.2 mg/dL (ref 1.7–2.4)
Magnesium: 1.8 mg/dL (ref 1.7–2.4)

## 2020-08-04 LAB — CBC
HCT: 29.2 % — ABNORMAL LOW (ref 36.0–46.0)
HCT: 28.3 % — ABNORMAL LOW (ref 36.0–46.0)
Hemoglobin: 9.7 g/dL — ABNORMAL LOW (ref 12.0–15.0)
Hemoglobin: 9.1 g/dL — ABNORMAL LOW (ref 12.0–15.0)
MCH: 31.1 pg (ref 26.0–34.0)
MCH: 30.7 pg (ref 26.0–34.0)
MCHC: 33.2 g/dL (ref 30.0–36.0)
MCHC: 32.2 g/dL (ref 30.0–36.0)
MCV: 93.6 fL (ref 80.0–100.0)
MCV: 95.6 fL (ref 80.0–100.0)
Platelets: 292 10*3/uL (ref 150–400)
Platelets: 263 10*3/uL (ref 150–400)
RBC: 3.12 MIL/uL — ABNORMAL LOW (ref 3.87–5.11)
RBC: 2.96 MIL/uL — ABNORMAL LOW (ref 3.87–5.11)
RDW: 13.7 % (ref 11.5–15.5)
RDW: 14.1 % (ref 11.5–15.5)
WBC: 21.4 10*3/uL — ABNORMAL HIGH (ref 4.0–10.5)
WBC: 21.3 10*3/uL — ABNORMAL HIGH (ref 4.0–10.5)
nRBC: 0 % (ref 0.0–0.2)
nRBC: 0 % (ref 0.0–0.2)

## 2020-08-04 MED ORDER — INSULIN ASPART 100 UNIT/ML ~~LOC~~ SOLN: 0.0000 [IU] | SUBCUTANEOUS | Status: DC

## 2020-08-04 MED ORDER — ZIPRASIDONE HCL 40 MG PO CAPS
40.0000 mg | ORAL_CAPSULE | Freq: Two times a day (BID) | ORAL | Status: DC
  Administered 2020-08-04 – 2020-08-09 (×11): 40 mg via ORAL
  Filled 2020-08-04 (×14): qty 1

## 2020-08-04 MED ORDER — POTASSIUM CHLORIDE CRYS ER 20 MEQ PO TBCR
20.0000 meq | EXTENDED_RELEASE_TABLET | Freq: Three times a day (TID) | ORAL | Status: AC
  Administered 2020-08-04 (×3): 20 meq via ORAL
  Filled 2020-08-04 (×3): qty 1

## 2020-08-04 MED ORDER — LEVOTHYROXINE SODIUM 100 MCG PO TABS
100.0000 ug | ORAL_TABLET | Freq: Every day | ORAL | Status: DC
  Administered 2020-08-05: 100 ug via ORAL
  Filled 2020-08-04: qty 1

## 2020-08-04 MED ORDER — INSULIN DETEMIR 100 UNIT/ML ~~LOC~~ SOLN: 20.0000 [IU] | Freq: Two times a day (BID) | SUBCUTANEOUS | Status: DC

## 2020-08-04 MED ORDER — TRAMADOL HCL 50 MG PO TABS
50.0000 mg | ORAL_TABLET | ORAL | Status: DC | PRN
  Administered 2020-08-04 – 2020-08-05 (×2): 50 mg via ORAL
  Filled 2020-08-04 (×2): qty 1

## 2020-08-04 MED ORDER — ENOXAPARIN SODIUM 40 MG/0.4ML ~~LOC~~ SOLN
40.0000 mg | Freq: Every day | SUBCUTANEOUS | Status: DC
  Administered 2020-08-04 – 2020-08-08 (×5): 40 mg via SUBCUTANEOUS
  Filled 2020-08-04 (×5): qty 0.4

## 2020-08-04 MED ORDER — POTASSIUM CHLORIDE 10 MEQ/50ML IV SOLN
10.0000 meq | INTRAVENOUS | Status: AC
  Administered 2020-08-04 (×3): 10 meq via INTRAVENOUS
  Filled 2020-08-04 (×3): qty 50

## 2020-08-04 MED ORDER — INSULIN DETEMIR 100 UNIT/ML ~~LOC~~ SOLN
20.0000 [IU] | Freq: Two times a day (BID) | SUBCUTANEOUS | Status: DC
  Administered 2020-08-04 – 2020-08-09 (×11): 20 [IU] via SUBCUTANEOUS
  Filled 2020-08-04 (×12): qty 0.2

## 2020-08-04 MED ORDER — FUROSEMIDE 10 MG/ML IJ SOLN
40.0000 mg | Freq: Two times a day (BID) | INTRAMUSCULAR | Status: AC
  Administered 2020-08-04 (×2): 40 mg via INTRAVENOUS
  Filled 2020-08-04 (×2): qty 4

## 2020-08-04 MED FILL — Potassium Chloride Inj 2 mEq/ML: INTRAVENOUS | Qty: 40 | Status: AC

## 2020-08-04 MED FILL — Heparin Sodium (Porcine) Inj 1000 Unit/ML: INTRAMUSCULAR | Qty: 10 | Status: AC

## 2020-08-04 MED FILL — Electrolyte-R (PH 7.4) Solution: INTRAVENOUS | Qty: 4000 | Status: AC

## 2020-08-04 MED FILL — Magnesium Sulfate Inj 50%: INTRAMUSCULAR | Qty: 10 | Status: AC

## 2020-08-04 MED FILL — Sodium Bicarbonate IV Soln 8.4%: INTRAVENOUS | Qty: 50 | Status: AC

## 2020-08-04 MED FILL — Heparin Sodium (Porcine) Inj 1000 Unit/ML: INTRAMUSCULAR | Qty: 30 | Status: AC

## 2020-08-04 MED FILL — Sodium Chloride IV Soln 0.9%: INTRAVENOUS | Qty: 2000 | Status: AC

## 2020-08-04 MED FILL — Thrombin (Recombinant) For Soln 20000 Unit: CUTANEOUS | Qty: 1 | Status: AC

## 2020-08-04 MED FILL — Mannitol IV Soln 20%: INTRAVENOUS | Qty: 500 | Status: AC

## 2020-08-04 MED FILL — Lidocaine HCl Local Soln Prefilled Syringe 100 MG/5ML (2%): INTRAMUSCULAR | Qty: 5 | Status: AC

## 2020-08-04 NOTE — Discharge Instructions (Signed)
Coronary Artery Bypass Grafting, Care After This sheet gives you information about how to care for yourself after your procedure. Your doctor may also give you more specific instructions. If you have problems or questions, call your doctor. What can I expect after the procedure? After the procedure, it is common to:  Feel sick to your stomach (nauseous).  Not want to eat as much as normal (lack of appetite).  Have trouble pooping (constipation).  Have weakness and tiredness (fatigue).  Feel sad (depressed) or grouchy (irritable).  Have pain or discomfort around the cuts from surgery (incisions). Follow these instructions at home: Medicines  Take over-the-counter and prescription medicines only as told by your doctor. Do not stop taking medicines or start any new medicines unless your doctor says it is okay.  If you were prescribed an antibiotic medicine, take it as told by your doctor. Do not stop taking the antibiotic even if you start to feel better. Incision care   Follow instructions from your doctor about how to take care of your cuts from surgery. Make sure you: ? Wash your hands with soap and water before and after you change your bandage (dressing). If you cannot use soap and water, use hand sanitizer. ? Change your bandage as told by your doctor. ? Leave stitches (sutures), skin glue, or skin tape (adhesive) strips in place. They may need to stay in place for 2 weeks or longer. If tape strips get loose and curl up, you may trim the loose edges. Do not remove tape strips completely unless your doctor says it is okay.  Make sure the surgery cuts are clean, dry, and protected.  Check your cut areas every day for signs of infection. Check for: ? More redness, swelling, or pain. ? More fluid or blood. ? Warmth. ? Pus or a bad smell.  If cuts were made in your legs: ? Avoid crossing your legs. ? Avoid sitting for long periods of time. Change positions every 30  minutes. ? Raise (elevate) your legs when you are sitting. Bathing  Do not take baths, swim, or use a hot tub until your doctor says it is okay.  You may take a shower. Pat the surgery cuts dry. Do not rub the cuts to dry. Eating and drinking   Eat foods that are high in fiber, such as beans, nuts, whole grains, and raw fruits and vegetables. Any meats you eat should be lean cut. Avoid canned, processed, and fried foods. This can help prevent trouble pooping. This is also a part of a heart-healthy diet.  Drink enough fluid to keep your pee (urine) pale yellow.  Do not drink alcohol until you are fully recovered. Ask your doctor when it is safe to drink alcohol. Activity  Rest and limit your activity as told by your doctor. You may be told to: ? Stop any activity right away if you have chest pain, shortness of breath, irregular heartbeats, or dizziness. Get help right away if you have any of these symptoms. ? Move around often for short periods or take short walks as told by your doctor. Slowly increase your activities. ? Avoid lifting, pushing, or pulling anything that is heavier than 10 lb (4.5 kg) for at least 6 weeks or as told by your doctor.  Do physical therapy or a cardiac rehab (cardiac rehabilitation) program as told by your doctor. ? Physical therapy involves doing exercises to maintain movement and build strength and endurance. ? A cardiac rehab program includes:  Exercise training.  Education.  Counseling.  Do not drive until your doctor says it is okay.  Ask your doctor when you can go back to work.  Ask your doctor when you can be sexually active. General instructions  Do not drive or use heavy machinery while taking prescription pain medicine.  Do not use any products that contain nicotine or tobacco. These include cigarettes, e-cigarettes, and chewing tobacco. If you need help quitting, ask your doctor.  Take 2-3 deep breaths every few hours during the day  while you get better. This helps expand your lungs and prevent problems.  If you were given a device called an incentive spirometer, use it several times a day to practice deep breathing. Support your chest with a pillow or your arms when you take deep breaths or cough.  Wear compression stockings as told by your doctor.  Weigh yourself every day. This helps to see if your body is holding (retaining) fluid that may make your heart and lungs work harder.  Keep all follow-up visits as told by your doctor. This is important. Contact a doctor if:  You have more redness, swelling, or pain around any cut.  You have more fluid or blood coming from any cut.  Any cut feels warm to the touch.  You have pus or a bad smell coming from any cut.  You have a fever.  You have swelling in your ankles or legs.  You have pain in your legs.  You gain 2 lb (0.9 kg) or more a day.  You feel sick to your stomach or you throw up (vomit).  You have watery poop (diarrhea). Get help right away if:  You have chest pain that goes to your jaw or arms.  You are short of breath.  You have a fast or irregular heartbeat.  You notice a "clicking" in your breastbone (sternum) when you move.  You have any signs of a stroke. "BE FAST" is an easy way to remember the main warning signs: ? B - Balance. Signs are dizziness, sudden trouble walking, or loss of balance. ? E - Eyes. Signs are trouble seeing or a change in how you see. ? F - Face. Signs are sudden weakness or loss of feeling of the face, or the face or eyelid drooping on one side. ? A - Arms. Signs are weakness or loss of feeling in an arm. This happens suddenly and usually on one side of the body. ? S - Speech. Signs are sudden trouble speaking, slurred speech, or trouble understanding what people say. ? T - Time. Time to call emergency services. Write down what time symptoms started.  You have other signs of a stroke, such as: ? A sudden, very  bad headache with no known cause. ? Feeling sick to your stomach. ? Throwing up. ? Jerky movements you cannot control (seizure). These symptoms may be an emergency. Do not wait to see if the symptoms will go away. Get medical help right away. Call your local emergency services (911 in the [place] Do not drive yourself to the hospital. Summary  After the procedure, it is common to have pain or discomfort in the cuts from surgery (incisions).  Do not take baths, swim, or use a hot tub until your doctor says it is okay.  Slowly increase your activities. You may need physical therapy or cardiac rehab.  Weigh yourself every day. This helps to see if your body is holding fluid. This information is not  intended to replace advice given to you by your health care provider. Make sure you discuss any questions you have with your health care provider. Document Revised: 04/16/2018 Document Reviewed: 04/16/2018 Elsevier Patient Education  2020 [employer].

## 2020-08-04 NOTE — Plan of Care (Signed)
  Problem: Education: ?Goal: Knowledge of General Education information will improve ?Description: Including pain rating scale, medication(s)/side effects and non-pharmacologic comfort measures ?Outcome: Progressing ?  ?Problem: Health Behavior/Discharge Planning: ?Goal: Ability to manage health-related needs will improve ?Outcome: Progressing ?  ?Problem: Clinical Measurements: ?Goal: Ability to maintain clinical measurements within normal limits will improve ?Outcome: Progressing ?Goal: Will remain free from infection ?Outcome: Progressing ?Goal: Diagnostic test results will improve ?Outcome: Progressing ?Goal: Respiratory complications will improve ?Outcome: Progressing ?Goal: Cardiovascular complication will be avoided ?Outcome: Progressing ?  ?Problem: Activity: ?Goal: Risk for activity intolerance will decrease ?Outcome: Progressing ?  ?Problem: Nutrition: ?Goal: Adequate nutrition will be maintained ?Outcome: Progressing ?  ?Problem: Coping: ?Goal: Level of anxiety will decrease ?Outcome: Progressing ?  ?Problem: Elimination: ?Goal: Will not experience complications related to bowel motility ?Outcome: Progressing ?Goal: Will not experience complications related to urinary retention ?Outcome: Progressing ?  ?Problem: Pain Managment: ?Goal: General experience of comfort will improve ?Outcome: Progressing ?  ?Problem: Safety: ?Goal: Ability to remain free from injury will improve ?Outcome: Progressing ?  ?Problem: Education: ?Goal: Will demonstrate proper wound care and an understanding of methods to prevent future damage ?Outcome: Progressing ?Goal: Knowledge of disease or condition will improve ?Outcome: Progressing ?Goal: Knowledge of the prescribed therapeutic regimen will improve ?Outcome: Progressing ?Goal: Individualized Educational Video(s) ?Outcome: Progressing ?  ?

## 2020-08-04 NOTE — Progress Notes (Signed)
[facility] BRIEF SICU PROGRESS NOTE  1 Day Post-Op  S/P Procedure(s) (LRB): CORONARY ARTERY BYPASS GRAFTING TIMES ONE USING LEFT INTERNAL MAMMARY ARTERY. (N/A) TRANSESOPHAGEAL ECHOCARDIOGRAM (TEE) (N/A)   Stable day NSR w/ stable BP Breathing comfortably on nasal cannula UOP adequate  Plan: Continue current plan  Rexene Alberts, MD 08/04/2020 4:56 PM

## 2020-08-04 NOTE — Addendum Note (Signed)
Addendum  created 08/04/20 0802 by Josephine Igo, CRNA   Order list changed, Pharmacy for encounter modified

## 2020-08-04 NOTE — Progress Notes (Signed)
1 Day Post-Op Procedure(s) (LRB): CORONARY ARTERY BYPASS GRAFTING TIMES ONE USING LEFT INTERNAL MAMMARY ARTERY. (N/A) TRANSESOPHAGEAL ECHOCARDIOGRAM (TEE) (N/A) Subjective: No complaints.  Objective: Vital signs in last 24 hours: Temp:  [96.08 F (35.6 C)-99 F (37.2 C)] 98.6 F (37 C) (12/15 0800) Pulse Rate:  [80-101] 89 (12/15 0800) Cardiac Rhythm: Normal sinus rhythm (12/15 0800) Resp:  [10-35] 23 (12/15 0800) BP: (86-156)/(51-80) 113/64 (12/15 0800) SpO2:  [96 %-100 %] 99 % (12/15 0800) Arterial Line BP: (70-214)/(44-184) 117/51 (12/15 0800) FiO2 (%):  [36 %-50 %] 36 % (12/14 1546) Weight:  [111.9 kg] 111.9 kg (12/15 0545)  Hemodynamic parameters for last 24 hours: PAP: (22-46)/(8-21) 30/14 CO:  [4.7 L/min-8.9 L/min] 8.9 L/min CI:  [2.3 L/min/m2-4.3 L/min/m2] 4.3 L/min/m2  Intake/Output from previous day: 12/14 0701 - 12/15 0700 In: 4686 [I.V.:2771.5; Blood:350; IV Piggyback:1564.5] Out: 2667 [Urine:1719; Blood:600; Chest Tube:348] Intake/Output this shift: Total I/O In: 392.6 [I.V.:129.5; IV Piggyback:263.2] Out: 20 [Urine:10; Chest Tube:10]  General appearance: alert and cooperative Neurologic: intact Heart: regular rate and rhythm, S1, S2 normal, no murmur Lungs: clear to auscultation bilaterally Extremities: edema mild Wound: dressing dry  Lab Results: Recent Labs    08/03/20 1839 08/04/20 0304  WBC 29.1* 21.4*  HGB 11.0* 9.7*  HCT 31.1* 29.2*  PLT 328 292   BMET:  Recent Labs    08/03/20 1839 08/04/20 0304  NA 133* 131*  K 3.5 3.7  CL 103 100  CO2 21* 23  GLUCOSE 135* 127*  BUN 5* <5*  CREATININE 0.60 0.58  CALCIUM 8.0* 7.7*    PT/INR:  Recent Labs    08/03/20 1239  LABPROT 15.5*  INR 1.3*   ABG    Component Value Date/Time   PHART 7.369 08/03/2020 1656   HCO3 23.1 08/03/2020 1656   TCO2 24 08/03/2020 1656   ACIDBASEDEF 2.0 08/03/2020 1656   O2SAT 99.0 08/03/2020 1656   CBG (last 3)  Recent Labs    08/04/20 0406  08/04/20 0514 08/04/20 0718  GLUCAP 116* 115* 125*   CXR: left basilar atelectasis  Assessment/Plan: S/P Procedure(s) (LRB): CORONARY ARTERY BYPASS GRAFTING TIMES ONE USING LEFT INTERNAL MAMMARY ARTERY. (N/A) TRANSESOPHAGEAL ECHOCARDIOGRAM (TEE) (N/A)  POD 1 Hemodynamically stable in sinus rhythm. Start Lopressor.  Volume excess: start diuresis and replace K+  She had hyponatremia and hypokalemia preop. Watch.  DC chest tubes, swan and arterial line.  DM: preop Hgb A1c 5.9 pm home regimen. Start Levemir and SSI, stop insulin drip.  IS, OOB.  PT consult for help with ambulation. She uses rolling walker at home.   LOS: 1 day    Gaye Pollack 08/04/2020

## 2020-08-04 NOTE — Hospital Course (Addendum)
  HPI:   The patient is a 69 year old woman with history of type 2 diabetes, hypertension, hyperlipidemia, hypothyroidism, remote smoking and COPD, bipolar disorder, fibromyalgia, GERD, OSA who reports a history of exertional shortness of breath for the last 10 years which she said is worsening recently. She has also developed substernal chest discomfort and generalized weakness with exertion and said that walking from her car today completely wore her out. She has been worked up by pulmonary medicine and PFT showed no significant obstruction or restrictive disease. A 2D echocardiogram on 06/15/2020 was a poor quality study due to poor acoustic windows even with contrast agent. A Lexiscan Myoview showed a small severe apical inferior and apical defect that could not be ruled out as scar or ischemia. Left ventricular ejection fraction was mildly decreased at 45% with apical and apical inferior hypokinesis. Cardiac catheterization 07/13/2020 showed severe single-vessel coronary disease with 70% ostial LAD stenosis and 90% mid LAD stenosis with a heavily calcified proximal and mid LAD. This was a large vessel that wraps around the apex. There is no significant disease in the left circumflex or right coronary arteries. Left and right heart pressures are normal.   She lives with her husband. She uses a rolling walker to ambulate due to arthritis, generalized weakness, and shortness of breath.  She has had some substernal chest discomfort with exertion.  She denies any dizziness or syncope.  She has had no orthopnea.  The patient and all relevant studies were evaluated by Dr. Cyndia Bent who recommended proceeding with CABG and she was admitted this hospitalization for the procedure.  Hospital course:  The patient was admitted and taken to the operating room and on 08/03/2020 he was taken to the operating room and he underwent CABG x1 with a graft from the LIMA to the LAD.  She tolerated the procedure well was taken  to the surgical intensive care unit in stable condition.  Postoperative hospital course   The patient has remained hemodynamically stable in sinus rhythm.  She does have some mild postoperative volume excess and has been started on a course of diuretics.  Chest tubes, Swan and arterial lines were all DC'd on postoperative day #1.  A PT consult was also obtained for ambulation and she was started on a course of routine rehab with the cardiac rehab personnel.  She is slow in her recovery in this regard but is working progress.  She has continued diuretic for volume overload.  We have continue to monitor her hyponatremia which was present preoperatively and is felt to be related to diuretic.  If it continues to trend down she may require a couple doses of tolvaptan.  She did have wheezing and has been restarted on her on Anoro and albuterol.  She continues to make slow progress in her oxygen wean and could potentially require home oxygen at least short-term.  Her anemia continues to be followed and her H&H are actually trending higher and is not felt that she will require transfusion at this time.  Most recent hemoglobin hematocrit dated 08/08/2020 is 8.9 and 26.5 respectively.  She continues on her nebs and pulmonary toilet.  She continues to have some minor volume overload and is currently on Lasix.  Incisions are noted to be healing well without evidence of infection.  She is tolerating diet.  Blood sugars are under good control and she will be resumed on her preoperative diabetic regimen at time of discharge.

## 2020-08-04 NOTE — Discharge Summary (Signed)
Physician Discharge Summary  Patient ID: [name] MRN: [id number] DOB/AGE: 1950/09/06 69 y.o.  Admit date: 08/03/2020 Discharge date: 08/09/2020   PCP isGutierrez, Garlon Hatchet, MD Referring Provider Marliss Czar, MD          Admission Diagnoses: Severe single-vessel coronary artery disease  Discharge Diagnoses:  Active Problems:   S/P CABG x 1   Patient Active Problem List   Diagnosis Date Noted  . S/P CABG x 1 08/03/2020  . Abnormal cardiovascular stress test   . Thoracic aorta atherosclerosis ([place]) 04/15/2020  . COPD (chronic obstructive pulmonary disease) ([place]) 04/15/2020  . Pill dysphagia 04/05/2020  . Cervical neck pain with evidence of disc disease 10/23/2019  . Left hip pain 10/23/2019  . Right knee pain 10/23/2019  . Osteoarthritis 10/23/2019  . Anemia 10/23/2019  . Hepatic steatosis 05/03/2019  . Thrombocytosis 04/24/2019  . Abdominal pain 04/24/2019  . Migraine 04/19/2018  . Obesity, morbid, BMI 40.0-49.9 ([place]) 08/17/2017  . Palpitations 01/08/2017  . Mass of right side of neck 07/10/2016  . Neutrophilia 04/30/2015  . Advanced care planning/counseling discussion 01/22/2015  . Health maintenance examination 01/22/2015  . Medicare annual wellness visit, subsequent 12/15/2013  . Tachycardia 09/13/2012  . Osteoarthritis of left knee 12/11/2011  . [place] intertrigo 05/10/2011  . Hypertension   . Bipolar disorder ([place])   . Fibromyalgia   . Hyperlipidemia associated with type 2 diabetes mellitus ([place])   . GERD (gastroesophageal reflux disease)   . Hypothyroidism 04/05/2011  . OSA on CPAP 03/16/2011  . CAD (coronary artery disease) 03/16/2011  . Diabetes mellitus type 2, controlled, with complications ([place]) 27/10/5007  . Dyspnea 01/03/2011     HPI:   The patient is a 69 year old woman with history of type 2 diabetes, hypertension, hyperlipidemia, hypothyroidism, remote smoking and COPD, bipolar disorder, fibromyalgia, GERD, OSA who reports  a history of exertional shortness of breath for the last 10 years which she said is worsening recently. She has also developed substernal chest discomfort and generalized weakness with exertion and said that walking from her car today completely wore her out. She has been worked up by pulmonary medicine and PFT showed no significant obstruction or restrictive disease. A 2D echocardiogram on 06/15/2020 was a poor quality study due to poor acoustic windows even with contrast agent. A Lexiscan Myoview showed a small severe apical inferior and apical defect that could not be ruled out as scar or ischemia. Left ventricular ejection fraction was mildly decreased at 45% with apical and apical inferior hypokinesis. Cardiac catheterization 07/13/2020 showed severe single-vessel coronary disease with 70% ostial LAD stenosis and 90% mid LAD stenosis with a heavily calcified proximal and mid LAD. This was a large vessel that wraps around the apex. There is no significant disease in the left circumflex or right coronary arteries. Left and right heart pressures are normal.   She lives with her husband. She uses a rolling walker to ambulate due to arthritis, generalized weakness, and shortness of breath.  She has had some substernal chest discomfort with exertion.  She denies any dizziness or syncope.  She has had no orthopnea.  The patient and all relevant studies were evaluated by Dr. Cyndia Bent who recommended proceeding with CABG and she was admitted this hospitalization for the procedure.  Hospital course:  The patient was admitted and taken to the operating room and on 08/03/2020 he was taken to the operating room and he underwent CABG x1 with a graft from the LIMA to the LAD.  She tolerated  the procedure well was taken to the surgical intensive care unit in stable condition.  Postoperative hospital course   The patient has remained hemodynamically stable in sinus rhythm.  She does have some mild postoperative volume  excess and has been started on a course of diuretics.  Chest tubes, Swan and arterial lines were all DC'd on postoperative day #1.  A PT consult was also obtained for ambulation and she was started on a course of routine rehab with the cardiac rehab personnel.  She is slow in her recovery in this regard but is working progress.  She has continued diuretic for volume overload.  We have continue to monitor her hyponatremia which was present preoperatively and is felt to be related to diuretic.  If it continues to trend down she may require a couple doses of tolvaptan.  She did have wheezing and has been restarted on her on Anoro and albuterol.  She continues to make slow progress in her oxygen wean and could potentially require home oxygen at least short-term.  Her anemia continues to be followed and her H&H are actually trending higher and is not felt that she will require transfusion at this time.  Most recent hemoglobin hematocrit dated 08/08/2020 is 8.9 and 26.5 respectively.  She continues on her nebs and pulmonary toilet.  She continues to have some minor volume overload and is currently on Lasix.  Incisions are noted to be healing well without evidence of infection.  She is tolerating diet.  Blood sugars are under good control and she will be resumed on her preoperative diabetic regimen at time of discharge. It was determined that she was ready for discharge today.     Discharged Condition: good  Consults: None  Significant Diagnostic Studies: Routine postoperative serial labs and chest x-rays.  Treatments: surgery:  CARDIOVASCULAR SURGERY OPERATIVE NOTE  08/03/2020  Surgeon:  Gaye Pollack, MD  First Assistant: Jadene Pierini,  PA-C   Preoperative Diagnosis:  Severe single-vessel coronary artery disease   Postoperative Diagnosis:  Same   Procedure:  1. Median Sternotomy 2. Extracorporeal circulation 3.   Coronary artery bypass grafting x 1   Left internal mammary  artery graft to the LAD     Anesthesia:  General Endotracheal Discharge Exam: Blood pressure 128/71, pulse 81, temperature 97.6 F (36.4 C), temperature source Oral, resp. rate 20, height 5\' 2"  (1.575 m), weight 108.6 kg, SpO2 98 %.   General appearance: alert, cooperative and no distress Heart: regular rate and rhythm, S1, S2 normal, no murmur, click, rub or gallop Lungs: clear to auscultation bilaterally Abdomen: soft, non-tender; bowel sounds normal; no masses,  no organomegaly Extremities: extremities normal, atraumatic, no cyanosis or edema Wound: clean and dry  Disposition:  Discharge disposition: 01-Home or Self Care       Discharge Instructions    Amb Referral to Cardiac Rehabilitation   Complete by: As directed    Diagnosis: CABG   CABG X ___: 1   After initial evaluation and assessments completed: Virtual Based Care may be provided alone or in conjunction with Phase 2 Cardiac Rehab based on patient barriers.: Yes     Allergies as of 08/09/2020      Reactions   Crestor [rosuvastatin] Other (See Comments)   myalgias   Lipitor [atorvastatin] Other (See Comments)   Mood swings   Lithium Other (See Comments)   abd pain, n/v, was hospitalized   Quetiapine Other (See Comments)   Muscle twitching, muscle spasm  Medication List    STOP taking these medications   acetaminophen 500 MG tablet Commonly known as: TYLENOL   acetaminophen 650 MG CR tablet Commonly known as: TYLENOL   Advil PM 200-25 MG Caps Generic drug: Ibuprofen-diphenhydrAMINE HCl   carvedilol 3.125 MG tablet Commonly known as: COREG     TAKE these medications   albuterol 108 (90 Base) MCG/ACT inhaler Commonly known as: VENTOLIN HFA Inhale 2 puffs into the lungs every 6 (six) hours as needed for wheezing or shortness of breath.   Anoro Ellipta 62.5-25 MCG/INH Aepb Generic drug: umeclidinium-vilanterol Inhale 1 puff into the lungs daily.   aspirin 325 MG EC tablet Take 1  tablet (325 mg total) by mouth daily. What changed:   medication strength  how much to take  when to take this   butalbital-acetaminophen-caffeine 50-325-40 MG tablet Commonly known as: FIORICET Take 1 tablet by mouth 2 (two) times daily as needed for headache or migraine.   ESTROVEN COMPLETE PO Take 1 tablet by mouth daily.   famotidine 20 MG tablet Commonly known as: PEPCID Take 20 mg by mouth every other day. Alternates with omeprazole   furosemide 40 MG tablet Commonly known as: LASIX Take 1 tablet (40 mg total) by mouth daily. Start taking on: August 10, 2020   glimepiride 1 MG tablet Commonly known as: AMARYL Take 1 tablet (1 mg total) by mouth daily with breakfast.   hydrochlorothiazide 25 MG tablet Commonly known as: HYDRODIURIL TAKE 1 TABLET DAILY   INS SYRINGE/NEEDLE 1CC/28G 28G X 1/2" 1 ML Misc Commonly known as: B-D INS SYR MICROFINE 1CC/28G USE AS DIRECTED   insulin NPH Human 100 UNIT/ML injection Commonly known as: NovoLIN N Inject 0.4 mLs (40 Units total) into the skin daily after supper. What changed: See the new instructions.   levothyroxine 100 MCG tablet Commonly known as: SYNTHROID TAKE 1 TABLET DAILY, AND ON2 DAYS A WEEK, TAKE AN     EXTRA 1/2 TABLET   lisinopril 5 MG tablet Commonly known as: ZESTRIL TAKE 1 TABLET DAILY   loratadine 10 MG tablet Commonly known as: CLARITIN Take 1 tablet (10 mg total) by mouth daily.   magnesium oxide 400 (241.3 Mg) MG tablet Commonly known as: MAG-OX Take 1 tablet (400 mg total) by mouth 2 (two) times daily.   metFORMIN 500 MG tablet Commonly known as: GLUCOPHAGE Take 2 tablets (1,000 mg total) by mouth in the morning and at bedtime.   metoprolol tartrate 25 MG tablet Commonly known as: LOPRESSOR Take 1 tablet (25 mg total) by mouth 2 (two) times daily.   multivitamin with minerals Tabs tablet Take 1 tablet by mouth daily. One A Day for Women   nystatin cream Commonly known as:  MYCOSTATIN APPLY TO AFFECTED AREA TWICE A DAY What changed:   how much to take  how to take this  when to take this  reasons to take this  additional instructions   omeprazole 20 MG capsule Commonly known as: PRILOSEC TAKE 1 DAILY-ALTERNATES WITH FAMOTIDINE What changed: See the new instructions.   ONE TOUCH LANCETS Misc Use to check sugar twice daily Dx: E11.8   OneTouch Ultra test strip Generic drug: glucose blood USE TO CHECK BLOOD SUGAR 2 TIMES   DAILY   OSTEO BI-FLEX ONE PER DAY PO Take 1 tablet by mouth daily.   OXYGEN Inhale 2 L into the lungs at bedtime.   pioglitazone 15 MG tablet Commonly known as: ACTOS TAKE 1 TABLET DAILY   potassium chloride  SA 20 MEQ tablet Commonly known as: KLOR-CON Take 1 tablet (20 mEq total) by mouth daily.   simvastatin 40 MG tablet Commonly known as: ZOCOR TAKE 1 TABLET AT BEDTIME   topiramate 100 MG tablet Commonly known as: Topamax Take 1 tablet (100 mg total) by mouth at bedtime. What changed: when to take this   traMADol 50 MG tablet Commonly known as: ULTRAM Take 1 tablet (50 mg total) by mouth every 6 (six) hours as needed for up to 7 days for moderate pain.   ziprasidone 40 MG capsule Commonly known as: GEODON Take 1 capsule (40 mg total) by mouth in the morning and at bedtime. What changed: See the new instructions.            Durable Medical Equipment  (From admission, onward)         Start     Ordered   08/08/20 1407  For home use only DME oxygen  Once       Question Answer Comment  Length of Need 6 Months   Mode or (Route) Nasal cannula   Liters per Minute 3   Frequency Continuous (stationary and portable oxygen unit needed)   Oxygen delivery system Gas      08/08/20 1407          Follow-up Information    Gaye Pollack, MD Follow up.   Specialty: Cardiothoracic Surgery Why: 1/12 at 12:30. Please arrive at 12:00 noon for a chest xray located at [facility] which is located on  the first floor of our building.  Contact information: [address] [place] [address] [phone]        Ria Bush, MD. Call in 1 day(s).   Specialty: Family Medicine Contact information: [place] [address] [phone]        suture removal appointment Follow up.   Why: 12/23 at 11:00am       Theora Gianotti, NP Follow up.   Specialties: Nurse Practitioner, Cardiology, Radiology Why: Your appointment is on 08/26/2020 at 1:55pm.  Contact information: [place] [address] [phone]              The patient has been discharged on:   1.Beta Blocker:  Yes [ y  ]                              No   [   ]                              If No, reason:  2.Ace Inhibitor/ARB: Yes Blue.Reese   ]                                     No  [    ]                                     If No, reason:  3.Statin:   Yes [  y ]                  No  [   ]  If No, reason:  4.Ecasa:  Yes  [  y ]                  No   [   ]                  If No, reason:  Signed: Elgie Collard 08/09/2020, 12:53 PM

## 2020-08-04 NOTE — Evaluation (Signed)
Physical Therapy Evaluation Patient Details Name: [name] MRN: [id number] DOB: [birth year] Today's Date: 08/04/2020   History of Present Illness  [name] is a 69 y.o. female with a hx of hypertension, hyperlipidemia, diabetes, former smoker x40 years, COPD, OSA who presents for follow-up.  Pt with CAD and had CABG 12/14.  Clinical Impression  Pt admitted with above diagnosis. Pt was able to ambulate with RW but needed mod assist and cues for safety as pt with poor endurance and fatigues quickly.  Also O2 at 3L at rest and needed 6L with activity. Will follow acutely. Feel that CIR would benefit pt to maximize function prior to d/c home. Will follow acutely. Pt currently with functional limitations due to the deficits listed below (see PT Problem List). Pt will benefit from skilled PT to increase their independence and safety with mobility to allow discharge to the venue listed below.      Follow Up Recommendations CIR;Supervision/Assistance - 24 hour    Equipment Recommendations  None recommended by PT    Recommendations for Other Services       Precautions / Restrictions Precautions Precautions: Fall;Sternal Precaution Booklet Issued: No Restrictions Weight Bearing Restrictions: No      Mobility  Bed Mobility Overal bed mobility: Needs Assistance Bed Mobility: Rolling;Sidelying to Sit Rolling: Min assist Sidelying to sit: Mod assist       General bed mobility comments: Assist needed to follow sternal precautions.    Transfers Overall transfer level: Needs assistance Equipment used: Rolling walker (2 wheeled) Transfers: Sit to/from Stand Sit to Stand: Min assist         General transfer comment: cues for hands on knees for sternal precautions with slight assist needed to rise.  Ambulation/Gait Ambulation/Gait assistance: Min assist;Mod assist Gait Distance (Feet): 15 Feet Assistive device: Rolling walker (2 wheeled) Gait Pattern/deviations:  Step-through pattern;Decreased stride length;Trunk flexed;Wide base of support;Drifts right/left   Gait velocity interpretation: <1.31 ft/sec, indicative of household ambulator General Gait Details: Initially pt with better posture but as she fatigues, she flexes trunk and was significantly flexed when arrived back to bed with noted fatigue and needing assist to get into correct position on bed and follow precautions. Pt mod assist for gait due to fatigue and poor endurance.  Needed 3L at rest for sats maintenance and 6L wtih acitivity.  Pt states she was dizzy but was not BP related as BP and HR stable.  Stairs            Wheelchair Mobility    Modified Rankin (Stroke Patients Only)       Balance Overall balance assessment: Needs assistance Sitting-balance support: No upper extremity supported;Feet supported Sitting balance-Leahy Scale: Fair     Standing balance support: Bilateral upper extremity supported;During functional activity Standing balance-Leahy Scale: Poor Standing balance comment: relies on UE support for balance                             Pertinent Vitals/Pain Pain Assessment: Faces Faces Pain Scale: Hurts little more Pain Location: incision Pain Descriptors / Indicators: Aching;Grimacing;Guarding Pain Intervention(s): Limited activity within patient's tolerance;Monitored during session;Repositioned    Home Living Family/patient expects to be discharged to:: Private residence Living Arrangements: Spouse/significant other Available Help at Discharge: Family;Available 24 hours/day Type of Home: Mobile home Home Access: Ramped entrance     Home Layout: One level Home Equipment: [place] - single point;Walker - 4 wheels;Bedside commode (3LO2 home O2)  Prior Function Level of Independence: Needs assistance   Gait / Transfers Assistance Needed: Walked with cane or rolator with Modif I  ADL's / Homemaking Assistance Needed: [place] at bed with a  bucket, pt did upper body and husband does lower body and back.  Pt dresses self.        Hand Dominance   Dominant Hand: Right    Extremity/Trunk Assessment   Upper Extremity Assessment Upper Extremity Assessment: Defer to OT evaluation    Lower Extremity Assessment Lower Extremity Assessment: Generalized weakness    Cervical / Trunk Assessment Cervical / Trunk Assessment: Normal  Communication   Communication: No difficulties  Cognition Arousal/Alertness: Awake/alert Behavior During Therapy: WFL for tasks assessed/performed Overall Cognitive Status: Within Functional Limits for tasks assessed                                        General Comments General comments (skin integrity, edema, etc.): 87 bpm, 98% 6L, 110/68    Exercises     Assessment/Plan    PT Assessment Patient needs continued PT services  PT Problem List Decreased balance;Decreased activity tolerance;Decreased mobility;Decreased knowledge of use of DME;Decreased safety awareness;Decreased knowledge of precautions;Cardiopulmonary status limiting activity       PT Treatment Interventions DME instruction;Gait training;Functional mobility training;Therapeutic activities;Therapeutic exercise;Balance training;Patient/family education    PT Goals (Current goals can be found in the Care Plan section)  Acute Rehab PT Goals Patient Stated Goal: to go home PT Goal Formulation: With patient Time For Goal Achievement: 08/18/20 Potential to Achieve Goals: Good    Frequency Min 3X/week   Barriers to discharge        Co-evaluation               AM-PAC PT "6 Clicks" Mobility  Outcome Measure Help needed turning from your back to your side while in a flat bed without using bedrails?: A Little Help needed moving from lying on your back to sitting on the side of a flat bed without using bedrails?: A Lot Help needed moving to and from a bed to a chair (including a wheelchair)?: A  Little Help needed standing up from a chair using your arms (e.g., wheelchair or bedside chair)?: A Little Help needed to walk in hospital room?: A Lot Help needed climbing 3-5 steps with a railing? : Total 6 Click Score: 14    End of Session Equipment Utilized During Treatment: Gait belt;Oxygen Activity Tolerance: Patient limited by fatigue Patient left: in bed;with call bell/phone within reach;with family/visitor present Nurse Communication: Mobility status PT Visit Diagnosis: Unsteadiness on feet (R26.81);Muscle weakness (generalized) (M62.81)    Time: 8099-8338 PT Time Calculation (min) (ACUTE ONLY): 25 min   Charges:   PT Evaluation $PT Eval Moderate Complexity: 1 Mod PT Treatments $Gait Training: 8-22 mins        Vashon Riordan W,PT Acute Rehabilitation Services Pager:  [phone]  Office:  [place] 08/04/2020, 2:20 PM

## 2020-08-04 NOTE — Progress Notes (Signed)
Inpatient Rehab Admissions Coordinator Note:   Per PT recommendations, pt was screened for CIR candidacy by Shann Medal, PT, DPT.  At this time we are recommending a CIR consult; please place an order if pt would like to be considered.  Also note that if pt would like to be considered, will need OT evaluation for insurance authorization request.  Please contact me with questions.   Shann Medal, PT, DPT [phone] 08/04/20 3:18 PM

## 2020-08-05 ENCOUNTER — Inpatient Hospital Stay (HOSPITAL_COMMUNITY): Payer: Medicare HMO

## 2020-08-05 LAB — GLUCOSE, CAPILLARY
Glucose-Capillary: 134 mg/dL — ABNORMAL HIGH (ref 70–99)
Glucose-Capillary: 137 mg/dL — ABNORMAL HIGH (ref 70–99)
Glucose-Capillary: 131 mg/dL — ABNORMAL HIGH (ref 70–99)
Glucose-Capillary: 137 mg/dL — ABNORMAL HIGH (ref 70–99)
Glucose-Capillary: 133 mg/dL — ABNORMAL HIGH (ref 70–99)

## 2020-08-05 LAB — CBC
HCT: 27.1 % — ABNORMAL LOW (ref 36.0–46.0)
Hemoglobin: 8.7 g/dL — ABNORMAL LOW (ref 12.0–15.0)
MCH: 30.9 pg (ref 26.0–34.0)
MCHC: 32.1 g/dL (ref 30.0–36.0)
MCV: 96.1 fL (ref 80.0–100.0)
Platelets: 250 10*3/uL (ref 150–400)
RBC: 2.82 MIL/uL — ABNORMAL LOW (ref 3.87–5.11)
RDW: 14 % (ref 11.5–15.5)
WBC: 21.3 10*3/uL — ABNORMAL HIGH (ref 4.0–10.5)
nRBC: 0 % (ref 0.0–0.2)

## 2020-08-05 LAB — BASIC METABOLIC PANEL
Anion gap: 9 (ref 5–15)
BUN: <5 mg/dL — ABNORMAL LOW (ref 8–23)
CO2: 23 mmol/L (ref 22–32)
Calcium: 8.2 mg/dL — ABNORMAL LOW (ref 8.9–10.3)
Chloride: 100 mmol/L (ref 98–111)
Creatinine, Ser: 0.57 mg/dL (ref 0.44–1.00)
GFR, Estimated: >60 mL/min (ref >60)
Glucose, Bld: 138 mg/dL — ABNORMAL HIGH (ref 70–99)
Potassium: 4.3 mmol/L (ref 3.5–5.1)
Sodium: 132 mmol/L — ABNORMAL LOW (ref 135–145)

## 2020-08-05 MED ORDER — ASPIRIN EC 325 MG PO TBEC
325.0000 mg | DELAYED_RELEASE_TABLET | Freq: Every day | ORAL | Status: DC
  Administered 2020-08-06 – 2020-08-09 (×4): 325 mg via ORAL
  Filled 2020-08-05 (×4): qty 1

## 2020-08-05 MED ORDER — SENNOSIDES-DOCUSATE SODIUM 8.6-50 MG PO TABS: 1.0000 | ORAL_TABLET | Freq: Two times a day (BID) | ORAL | Status: DC | PRN

## 2020-08-05 MED ORDER — METOPROLOL TARTRATE 25 MG PO TABS
25.0000 mg | ORAL_TABLET | Freq: Two times a day (BID) | ORAL | Status: DC
  Administered 2020-08-05: 25 mg via ORAL
  Filled 2020-08-05: qty 1

## 2020-08-05 MED ORDER — ONDANSETRON HCL 4 MG/2ML IJ SOLN: 4.0000 mg | Freq: Four times a day (QID) | INTRAMUSCULAR | Status: DC | PRN

## 2020-08-05 MED ORDER — SODIUM CHLORIDE 0.9 % IV SOLN: 250.0000 mL | INTRAVENOUS | Status: DC | PRN

## 2020-08-05 MED ORDER — ONDANSETRON HCL 4 MG PO TABS: 4.0000 mg | ORAL_TABLET | Freq: Four times a day (QID) | ORAL | Status: DC | PRN

## 2020-08-05 MED ORDER — POTASSIUM CHLORIDE CRYS ER 20 MEQ PO TBCR
20.0000 meq | EXTENDED_RELEASE_TABLET | Freq: Two times a day (BID) | ORAL | Status: AC
  Administered 2020-08-05 – 2020-08-07 (×6): 20 meq via ORAL
  Filled 2020-08-05 (×6): qty 1

## 2020-08-05 MED ORDER — TOPIRAMATE 25 MG PO TABS
100.0000 mg | ORAL_TABLET | Freq: Every day | ORAL | Status: DC
  Administered 2020-08-05 – 2020-08-08 (×4): 100 mg via ORAL
  Filled 2020-08-05 (×3): qty 4
  Filled 2020-08-05: qty 1
  Filled 2020-08-05: qty 4

## 2020-08-05 MED ORDER — TRAMADOL HCL 50 MG PO TABS: 50.0000 mg | ORAL_TABLET | ORAL | Status: DC | PRN

## 2020-08-05 MED ORDER — FUROSEMIDE 40 MG PO TABS
40.0000 mg | ORAL_TABLET | Freq: Every day | ORAL | Status: AC
  Administered 2020-08-06 – 2020-08-08 (×3): 40 mg via ORAL
  Filled 2020-08-05 (×3): qty 1

## 2020-08-05 MED ORDER — METOPROLOL TARTRATE 25 MG/10 ML ORAL SUSPENSION: 25.0000 mg | Freq: Two times a day (BID) | ORAL | Status: DC

## 2020-08-05 MED ORDER — CHLORHEXIDINE GLUCONATE CLOTH 2 % EX PADS
6.0000 | MEDICATED_PAD | Freq: Every day | CUTANEOUS | Status: DC
  Administered 2020-08-05 – 2020-08-08 (×3): 6 via TOPICAL

## 2020-08-05 MED ORDER — SODIUM CHLORIDE 0.9% FLUSH
3.0000 mL | Freq: Two times a day (BID) | INTRAVENOUS | Status: DC
  Administered 2020-08-05 – 2020-08-09 (×8): 3 mL via INTRAVENOUS

## 2020-08-05 MED ORDER — ~~LOC~~ CARDIAC SURGERY, PATIENT & FAMILY EDUCATION: Freq: Once | Status: AC

## 2020-08-05 MED ORDER — PANTOPRAZOLE SODIUM 40 MG PO TBEC
40.0000 mg | DELAYED_RELEASE_TABLET | Freq: Every day | ORAL | Status: DC
  Administered 2020-08-06 – 2020-08-09 (×4): 40 mg via ORAL
  Filled 2020-08-05 (×4): qty 1

## 2020-08-05 MED ORDER — SODIUM CHLORIDE 0.9% FLUSH: 3.0000 mL | INTRAVENOUS | Status: DC | PRN

## 2020-08-05 MED ORDER — METOPROLOL TARTRATE 25 MG PO TABS
25.0000 mg | ORAL_TABLET | Freq: Two times a day (BID) | ORAL | Status: DC
  Administered 2020-08-05 – 2020-08-09 (×8): 25 mg via ORAL
  Filled 2020-08-05 (×8): qty 1

## 2020-08-05 MED ORDER — FUROSEMIDE 10 MG/ML IJ SOLN
40.0000 mg | Freq: Once | INTRAMUSCULAR | Status: AC
  Administered 2020-08-05: 40 mg via INTRAVENOUS
  Filled 2020-08-05: qty 4

## 2020-08-05 MED ORDER — OXYCODONE HCL 5 MG PO TABS
5.0000 mg | ORAL_TABLET | ORAL | Status: DC | PRN
  Administered 2020-08-05 – 2020-08-09 (×6): 10 mg via ORAL
  Filled 2020-08-05 (×6): qty 2

## 2020-08-05 MED ORDER — INSULIN ASPART 100 UNIT/ML ~~LOC~~ SOLN: 0.0000 [IU] | Freq: Three times a day (TID) | SUBCUTANEOUS | Status: DC

## 2020-08-05 MED ORDER — INSULIN ASPART 100 UNIT/ML ~~LOC~~ SOLN
0.0000 [IU] | Freq: Three times a day (TID) | SUBCUTANEOUS | Status: DC
  Administered 2020-08-05 (×2): 2 [IU] via SUBCUTANEOUS
  Administered 2020-08-07: 8 [IU] via SUBCUTANEOUS
  Administered 2020-08-07 – 2020-08-09 (×2): 2 [IU] via SUBCUTANEOUS

## 2020-08-05 MED ORDER — POTASSIUM CHLORIDE CRYS ER 20 MEQ PO TBCR
20.0000 meq | EXTENDED_RELEASE_TABLET | Freq: Once | ORAL | Status: AC
  Administered 2020-08-05: 20 meq via ORAL
  Filled 2020-08-05: qty 1

## 2020-08-05 NOTE — Progress Notes (Signed)
2 Days Post-Op Procedure(s) (LRB): CORONARY ARTERY BYPASS GRAFTING TIMES ONE USING LEFT INTERNAL MAMMARY ARTERY. (N/A) TRANSESOPHAGEAL ECHOCARDIOGRAM (TEE) (N/A) Subjective: Doesn't feel great this am. Back is sore, has a headache and nausea. Didn't feel like she could walk this am.  Objective: Vital signs in last 24 hours: Temp:  [97.9 F (36.6 C)-98.6 F (37 C)] 98.5 F (36.9 C) (12/16 0756) Pulse Rate:  [76-197] 100 (12/16 0600) Cardiac Rhythm: Normal sinus rhythm (12/16 0400) Resp:  [19-39] 29 (12/16 0600) BP: (97-138)/(43-103) 129/56 (12/16 0600) SpO2:  [92 %-100 %] 99 % (12/16 0600) Arterial Line BP: (121-122)/(55-57) 122/57 (12/15 0914) Weight:  [111.4 kg] 111.4 kg (12/16 0600)  Hemodynamic parameters for last 24 hours: PAP: (34-38)/(12-15) 38/15  Intake/Output from previous day: 12/15 0701 - 12/16 0700 In: 863.3 [I.V.:440.5; IV Piggyback:422.8] Out: 2590 [Urine:2570; Chest Tube:20] Intake/Output this shift: No intake/output data recorded.  General appearance: alert and cooperative Neurologic: intact Heart: regular rate and rhythm, S1, S2 normal, no murmur Lungs: clear to auscultation bilaterally Extremities: edema mild Wound: dressing dry  Lab Results: Recent Labs    08/04/20 1558 08/05/20 0502  WBC 21.3* 21.3*  HGB 9.1* 8.7*  HCT 28.3* 27.1*  PLT 263 250   BMET:  Recent Labs    08/04/20 1558 08/05/20 0502  NA 132* 132*  K 4.0 4.3  CL 101 100  CO2 23 23  GLUCOSE 128* 138*  BUN 5* <5*  CREATININE 0.53 0.57  CALCIUM 7.8* 8.2*    PT/INR:  Recent Labs    08/03/20 1239  LABPROT 15.5*  INR 1.3*   ABG    Component Value Date/Time   PHART 7.369 08/03/2020 1656   HCO3 23.1 08/03/2020 1656   TCO2 24 08/03/2020 1656   ACIDBASEDEF 2.0 08/03/2020 1656   O2SAT 99.0 08/03/2020 1656   CBG (last 3)  Recent Labs    08/04/20 2313 08/05/20 0350 08/05/20 0648  GLUCAP 123* 134* 137*   CXR: rotated but looks ok. No significant pleural  effusion.  Assessment/Plan: S/P Procedure(s) (LRB): CORONARY ARTERY BYPASS GRAFTING TIMES ONE USING LEFT INTERNAL MAMMARY ARTERY. (N/A) TRANSESOPHAGEAL ECHOCARDIOGRAM (TEE) (N/A)  POD 2  Hemodynamically stable in sinus rhythm. Increase Lopressor to 25 bid.  Volume excess: wt is probably 8 lbs over preop. Continue diuresis.  Preop hyponatremia and hypokalemia. May have been diuretic induced.   DM: glucose under good control Continue Levemir and SSI.  DC sleeve and foley.  Deconditioning and chronic debilitation due to morbid obesity and arthritis. Ambulated preop with rolling walker. Continue mobilization, PT, IS.  Transfer to 4E.   LOS: 2 days    Gaye Pollack 08/05/2020

## 2020-08-05 NOTE — Progress Notes (Addendum)
Physical Therapy Treatment Patient Details Name: [name] MRN: [id number] DOB: [birth year] Today's Date: 08/05/2020    History of Present Illness Keamber THAYER INABINET is a 69 y.o. female with a hx of hypertension, hyperlipidemia, diabetes, former smoker x40 years, COPD, OSA who presents for follow-up.  Pt with CAD and had CABG 12/14.    PT Comments    Pt admitted with above diagnosis. Pt was able to ambulate with min to mod assist as she fatigued with RW. Has worsening posture as she walks and pt could not continue due to incr work of breathing.  Tried to get pt to rest and walk more but pt states she is too tired and her back hurt.  Asked nurse to ask for kpad for back.  Pt did do some LE exercises.  Pt currently with functional limitations due to balance and endurance deficits. Pt will benefit from skilled PT to increase their independence and safety with mobility to allow discharge to the venue listed below.     Follow Up Recommendations  CIR;Supervision/Assistance - 24 hour     Equipment Recommendations  None recommended by PT    Recommendations for Other Services       Precautions / Restrictions Precautions Precautions: Fall;Sternal Precaution Booklet Issued: Yes (comment) Precaution Comments: reviewed precautions and gave handout    Mobility  Bed Mobility Overal bed mobility: Needs Assistance             General bed mobility comments: Pt was in chair  Transfers Overall transfer level: Needs assistance Equipment used: Rolling walker (2 wheeled) Transfers: Sit to/from Stand Sit to Stand: Min assist         General transfer comment: cues for hands on knees for sternal precautions with slight assist needed to rise.  Ambulation/Gait Ambulation/Gait assistance: Min assist;Mod assist Gait Distance (Feet): 15 Feet Assistive device: Rolling walker (2 wheeled) Gait Pattern/deviations: Step-through pattern;Decreased stride length;Trunk flexed;Wide base of  support;Drifts right/left   Gait velocity interpretation: <1.31 ft/sec, indicative of household ambulator General Gait Details: Initially pt with better posture but as she fatigues, she flexes trunk and was significantly flexed by end of walk as well as DOE4/4.  had to bring chair to her to sit down. Pt mod assist for end of gait due to fatigue and poor endurance.  Needed 3L at rest for sats maintenance and 6L wtih acitivity.  Pt states she was dizzy but was not BP related as BP and HR stable.   Stairs             Wheelchair Mobility    Modified Rankin (Stroke Patients Only)       Balance Overall balance assessment: Needs assistance Sitting-balance support: No upper extremity supported;Feet supported Sitting balance-Leahy Scale: Fair     Standing balance support: Bilateral upper extremity supported;During functional activity Standing balance-Leahy Scale: Poor Standing balance comment: relies on UE support for balance                            Cognition Arousal/Alertness: Awake/alert Behavior During Therapy: WFL for tasks assessed/performed Overall Cognitive Status: Within Functional Limits for tasks assessed                                        Exercises General Exercises - Lower Extremity Ankle Circles/Pumps: AROM;Both;10 reps;Seated Long Arc Quad: AROM;Both;10 reps;Seated Hip Flexion/Marching:  AROM;Both;10 reps;Seated    General Comments General comments (skin integrity, edema, etc.): VSS      Pertinent Vitals/Pain Pain Assessment: Faces Faces Pain Scale: Hurts whole lot Pain Location: back Pain Descriptors / Indicators: Aching;Grimacing;Guarding Pain Intervention(s): Limited activity within patient's tolerance;Monitored during session;Repositioned    Home Living                      Prior Function            PT Goals (current goals can now be found in the care plan section) Acute Rehab PT Goals Patient Stated  Goal: to go home Progress towards PT goals: Progressing toward goals    Frequency    Min 3X/week      PT Plan Current plan remains appropriate    Co-evaluation              AM-PAC PT "6 Clicks" Mobility   Outcome Measure  Help needed turning from your back to your side while in a flat bed without using bedrails?: A Little Help needed moving from lying on your back to sitting on the side of a flat bed without using bedrails?: A Lot Help needed moving to and from a bed to a chair (including a wheelchair)?: A Little Help needed standing up from a chair using your arms (e.g., wheelchair or bedside chair)?: A Little Help needed to walk in hospital room?: A Lot Help needed climbing 3-5 steps with a railing? : Total 6 Click Score: 14    End of Session Equipment Utilized During Treatment: Gait belt;Oxygen Activity Tolerance: Patient limited by fatigue Patient left: with call bell/phone within reach;in chair Nurse Communication: Mobility status PT Visit Diagnosis: Unsteadiness on feet (R26.81);Muscle weakness (generalized) (M62.81)     Time: 1660-6301 PT Time Calculation (min) (ACUTE ONLY): 23 min  Charges:  $Gait Training: 8-22 mins $Therapeutic Exercise: 8-22 mins                     Eual Lindstrom W,PT Acute Rehabilitation Services Pager:  [phone]  Office:  [place] 08/05/2020, 1:29 PM

## 2020-08-05 NOTE — Progress Notes (Signed)
CARDIAC REHAB PHASE I   PRE:  Rate/Rhythm: 86 SR  BP:  Sitting: 112/42      SaO2: 100 3L  MODE:  Ambulation: 12 ft outside of room   POST:  Rate/Rhythm: 105 ST  BP:  Sitting: 133/58    SaO2: 97 3L  After much convincing, pt agreeable to attempt to ambulate. Pt assisted out of chair, c/o dizziness and helped to sit on edge of bed. Pt coached through purse lipped breathing. After more support and encouragement, pt agreeable to try again. Pt ambulated from bed to hallway and back. Pt c/o back pain and SOB. Pt returned to bed for central line d/c. Bedside table and call bell within reach. 500 on IS. Encouraged pt to continue to be willing to participate in rehab. Will continue to follow.  [phone] Rufina Falco, RN BSN 08/05/2020 2:02 PM

## 2020-08-06 LAB — BASIC METABOLIC PANEL
Anion gap: 9 (ref 5–15)
BUN: 5 mg/dL — ABNORMAL LOW (ref 8–23)
CO2: 22 mmol/L (ref 22–32)
Calcium: 8.3 mg/dL — ABNORMAL LOW (ref 8.9–10.3)
Chloride: 99 mmol/L (ref 98–111)
Creatinine, Ser: 0.63 mg/dL (ref 0.44–1.00)
GFR, Estimated: >60 mL/min (ref >60)
Glucose, Bld: 121 mg/dL — ABNORMAL HIGH (ref 70–99)
Potassium: 4.6 mmol/L (ref 3.5–5.1)
Sodium: 130 mmol/L — ABNORMAL LOW (ref 135–145)

## 2020-08-06 LAB — CBC
HCT: 24.8 % — ABNORMAL LOW (ref 36.0–46.0)
Hemoglobin: 8.5 g/dL — ABNORMAL LOW (ref 12.0–15.0)
MCH: 32 pg (ref 26.0–34.0)
MCHC: 34.3 g/dL (ref 30.0–36.0)
MCV: 93.2 fL (ref 80.0–100.0)
Platelets: 248 10*3/uL (ref 150–400)
RBC: 2.66 MIL/uL — ABNORMAL LOW (ref 3.87–5.11)
RDW: 13.8 % (ref 11.5–15.5)
WBC: 18.5 10*3/uL — ABNORMAL HIGH (ref 4.0–10.5)
nRBC: 0 % (ref 0.0–0.2)

## 2020-08-06 LAB — GLUCOSE, CAPILLARY
Glucose-Capillary: 113 mg/dL — ABNORMAL HIGH (ref 70–99)
Glucose-Capillary: 106 mg/dL — ABNORMAL HIGH (ref 70–99)
Glucose-Capillary: 111 mg/dL — ABNORMAL HIGH (ref 70–99)
Glucose-Capillary: 131 mg/dL — ABNORMAL HIGH (ref 70–99)

## 2020-08-06 MED ORDER — FE FUMARATE-B12-VIT C-FA-IFC PO CAPS
1.0000 | ORAL_CAPSULE | Freq: Two times a day (BID) | ORAL | Status: DC
  Administered 2020-08-06 – 2020-08-09 (×7): 1 via ORAL
  Filled 2020-08-06 (×7): qty 1

## 2020-08-06 MED ORDER — ALBUTEROL SULFATE HFA 108 (90 BASE) MCG/ACT IN AERS
2.0000 | INHALATION_SPRAY | Freq: Four times a day (QID) | RESPIRATORY_TRACT | Status: DC | PRN
  Administered 2020-08-06: 2 via RESPIRATORY_TRACT
  Filled 2020-08-06: qty 6.7

## 2020-08-06 MED ORDER — UMECLIDINIUM-VILANTEROL 62.5-25 MCG/INH IN AEPB
1.0000 | INHALATION_SPRAY | Freq: Every day | RESPIRATORY_TRACT | Status: DC
  Administered 2020-08-07 – 2020-08-09 (×3): 1 via RESPIRATORY_TRACT
  Filled 2020-08-06: qty 14

## 2020-08-06 NOTE — Addendum Note (Signed)
Addendum  created 08/06/20 0836 by Roberts Gaudy, MD   Clinical Note Signed

## 2020-08-06 NOTE — Progress Notes (Signed)
CARDIAC REHAB PHASE I   PRE:  Rate/Rhythm: 85 SR    BP: sitting 118/60    SaO2: 100 2L  MODE:  Ambulation: 20 ft   POST:  Rate/Rhythm: 106 ST    BP: sitting 133/67     SaO2: 98 2L  Pt willing to walk. Moved to EOB independently when head elevated. Stood and to [facility]. After clean up pt sat and rested in rollator. Able to stand and turn with standby assist to ambulate. Used rollator, gait belt, assist x2 min assist, 2L. Slow pace, bent posture which she sts is her baseline. Fatigued and leaning over more after 10 ft in hall therefore had her sit on rollator. Wheezing noted, encouraged pursed lip breathing. After 4-5 min rest, pt able to walk to recliner. VSS, fatigued. Encouraged mobility and IS. She is motivated to get home.  [id number]   [place], ACSM 08/06/2020 9:51 AM

## 2020-08-06 NOTE — Progress Notes (Signed)
Anesthesiology Follow-up:   69 year old female 3 days S/P CABG X 1. Awake and alert, complaining of some incisional pain, hemodynamically stable, now on 4N  VS: T- 36.8 BP- 124/51 HR- 72 (SR) RR- 21 O2 sat 99% on 2L O2  K-4.6 Na- 130 BUN/Cr.- 5/0.63 H/H- 8.5/24.8 Platelets- 45,28  69 year old female with obesity, Type 2 DM,  Htn, COPD, and symptomatic ostial LAD stenosis, now 3 days S/P CABG X 1. Stable post-op course, extubated 3.5 hours post-op.   Roberts Gaudy

## 2020-08-06 NOTE — Plan of Care (Signed)
  Problem: Clinical Measurements: Goal: Respiratory complications will improve Outcome: Not Progressing   Problem: Activity: Goal: Risk for activity intolerance will decrease Outcome: Not Progressing   

## 2020-08-06 NOTE — Progress Notes (Signed)
Mobility Specialist - Progress Note   08/06/20 1527  Mobility  Activity Transferred to/from [facility];Ambulated in room  Level of Assistance Minimal assist, patient does 75% or more  Assistive Device Four wheel walker;BSC  Distance Ambulated (ft) 20 ft  Mobility Response Tolerated fair  Mobility performed by Mobility specialist  $Mobility charge 1 Mobility   Pre-mobility: 88 HR, 97% SpO2 During mobility: 108 HR, 100% SpO2 Post-mobility: 96 HR, 100% SpO2  Pt assisted to [facility], then ambulated to door and back to bed. Min assist to stand when sitting, otherwise standby. States she feels more out of breath this time than when she ambulated this am.   Pricilla Handler Mobility Specialist Mobility Specialist Phone: [phone]

## 2020-08-06 NOTE — Progress Notes (Signed)
MD: please order the necessary breathing treatment(s) for this patient. Patient has audible wheezing. Thank you, nursing

## 2020-08-06 NOTE — Plan of Care (Signed)
?  Problem: Clinical Measurements: ?Goal: Will remain free from infection ?Outcome: Progressing ?  ?

## 2020-08-06 NOTE — Progress Notes (Signed)
[address]       [place] [address]             [phone]      3 Days Post-Op Procedure(s) (LRB): CORONARY ARTERY BYPASS GRAFTING TIMES ONE USING LEFT INTERNAL MAMMARY ARTERY. (N/A) TRANSESOPHAGEAL ECHOCARDIOGRAM (TEE) (N/A) Subjective: Feels ok. Had some wheezing overnight. Ambulated to bathroom.  Objective: Vital signs in last 24 hours: Temp:  [98.2 F (36.8 C)-98.7 F (37.1 C)] 98.2 F (36.8 C) (12/17 0824) Pulse Rate:  [83-102] 100 (12/17 0824) Cardiac Rhythm: Normal sinus rhythm (12/16 2200) Resp:  [18-31] 21 (12/17 0824) BP: (110-143)/(42-82) 124/51 (12/17 0824) SpO2:  [97 %-100 %] 99 % (12/17 0824) Weight:  [98.5 kg] 98.5 kg (12/17 0332)  Hemodynamic parameters for last 24 hours:    Intake/Output from previous day: 12/16 0701 - 12/17 0700 In: 259 [P.O.:247; I.V.:12] Out: 850 [Urine:850] Intake/Output this shift: Total I/O In: -  Out: 550 [Urine:550]  General appearance: alert and cooperative Neurologic: intact Heart: regular rate and rhythm, S1, S2 normal, no murmur Lungs: diminished breath sounds bibasilar Abdomen: soft, non-tender; bowel sounds normal Extremities: edema mild Wound: incisions ok  Lab Results: Recent Labs    08/05/20 0502 08/06/20 0117  WBC 21.3* 18.5*  HGB 8.7* 8.5*  HCT 27.1* 24.8*  PLT 250 248   BMET:  Recent Labs    08/05/20 0502 08/06/20 0117  NA 132* 130*  K 4.3 4.6  CL 100 99  CO2 23 22  GLUCOSE 138* 121*  BUN <5* 5*  CREATININE 0.57 0.63  CALCIUM 8.2* 8.3*    PT/INR:  Recent Labs    08/03/20 1239  LABPROT 15.5*  INR 1.3*   ABG    Component Value Date/Time   PHART 7.369 08/03/2020 1656   HCO3 23.1 08/03/2020 1656   TCO2 24 08/03/2020 1656   ACIDBASEDEF 2.0 08/03/2020 1656   O2SAT 99.0 08/03/2020 1656   CBG (last 3)  Recent Labs    08/05/20 1522 08/05/20 2127 08/06/20 0608  GLUCAP 137* 133* 113*    Meds Scheduled Meds: . aspirin EC  325 mg Oral Daily  .  Chlorhexidine Gluconate Cloth  6 each Topical Daily  . enoxaparin (LOVENOX) injection  40 mg Subcutaneous QHS  . furosemide  40 mg Oral Daily  . insulin aspart  0-24 Units Subcutaneous TID AC & HS  . insulin detemir  20 Units Subcutaneous BID  . mouth rinse  15 mL Mouth Rinse BID  . metoprolol tartrate  25 mg Oral BID  . pantoprazole  40 mg Oral QAC breakfast  . potassium chloride  20 mEq Oral BID  . simvastatin  40 mg Oral QHS  . sodium chloride flush  3 mL Intravenous Q12H  . topiramate  100 mg Oral QHS  . ziprasidone  40 mg Oral BID WC   Continuous Infusions: . sodium chloride     PRN Meds:.sodium chloride, acetaminophen, ondansetron **OR** ondansetron (ZOFRAN) IV, oxyCODONE, senna-docusate, sodium chloride flush, traMADol  Xrays DG Chest Port 1 View  Result Date: 08/05/2020 CLINICAL DATA:  Shortness of breath. EXAM: PORTABLE CHEST 1 VIEW COMPARISON:  08/04/2020 and prior. FINDINGS: Indwelling right IJ introducer sheath. Interval removal of Swan-Ganz catheter, mediastinal and left chest tubes. Postsurgical appearance of the cardiomediastinal silhouette, unchanged. No pneumothorax. Trace right and small left pleural effusions. Left predominant basilar opacities are unchanged. IMPRESSION: Interval removal of Swan-Ganz catheter and mediastinal/chest tubes. Bibasilar opacities and bilateral pleural effusions are unchanged. Electronically  Signed   By: Primitivo Gauze M.D.   On: 08/05/2020 07:58    Assessment/Plan: S/P Procedure(s) (LRB): CORONARY ARTERY BYPASS GRAFTING TIMES ONE USING LEFT INTERNAL MAMMARY ARTERY. (N/A) TRANSESOPHAGEAL ECHOCARDIOGRAM (TEE) (N/A)   POD#3  1 afeb, VSS, sinus rhythm 2 sats good on 3 liters [place] 3 sodium down a little further to 130 , good UOP is adeq, doubt weight is correct as 12 KG less than yesterday. Normal renal fxn 4 H/H down 24.8 from 27.1 yesterday- still equalibrating, follow but not at transfusion threshold . Will add trinsicon  LOS: 3  days    Jessica Giovanni PA-C PAGER [phone] 08/06/2020   Chart reviewed, patient examined, agree with above. She is doing fairly well overall considering her chronic debilitation related to morbid obesity and arthritis.  Wt is not accurate today. Baseline preop was 238 so I think she is still above that and has some edema. Continue diuretic. Sodium trending down slightly and she had hyponatremia to 125 on preop labs. I think this is related to diuretic. If it continues to trend down I would give her a couple doses of Tolvaptan. Her kidneys probably waste sodium and potassium with diuretics.  Wheezing overnight. Will resume Anoro and albuterol. Diuresis should help that too. Continue IS, ambulation with PT. Probably home Monday if she continues to mobilize.

## 2020-08-07 LAB — BASIC METABOLIC PANEL
Anion gap: 10 (ref 5–15)
BUN: 7 mg/dL — ABNORMAL LOW (ref 8–23)
CO2: 22 mmol/L (ref 22–32)
Calcium: 8.5 mg/dL — ABNORMAL LOW (ref 8.9–10.3)
Chloride: 102 mmol/L (ref 98–111)
Creatinine, Ser: 0.54 mg/dL (ref 0.44–1.00)
GFR, Estimated: >60 mL/min (ref >60)
Glucose, Bld: 130 mg/dL — ABNORMAL HIGH (ref 70–99)
Potassium: 4.2 mmol/L (ref 3.5–5.1)
Sodium: 134 mmol/L — ABNORMAL LOW (ref 135–145)

## 2020-08-07 LAB — CBC
HCT: 23.6 % — ABNORMAL LOW (ref 36.0–46.0)
Hemoglobin: 8 g/dL — ABNORMAL LOW (ref 12.0–15.0)
MCH: 31.5 pg (ref 26.0–34.0)
MCHC: 33.9 g/dL (ref 30.0–36.0)
MCV: 92.9 fL (ref 80.0–100.0)
Platelets: 294 10*3/uL (ref 150–400)
RBC: 2.54 MIL/uL — ABNORMAL LOW (ref 3.87–5.11)
RDW: 13.7 % (ref 11.5–15.5)
WBC: 15.1 10*3/uL — ABNORMAL HIGH (ref 4.0–10.5)
nRBC: 0 % (ref 0.0–0.2)

## 2020-08-07 LAB — GLUCOSE, CAPILLARY
Glucose-Capillary: 120 mg/dL — ABNORMAL HIGH (ref 70–99)
Glucose-Capillary: 219 mg/dL — ABNORMAL HIGH (ref 70–99)
Glucose-Capillary: 81 mg/dL (ref 70–99)
Glucose-Capillary: 108 mg/dL — ABNORMAL HIGH (ref 70–99)
Glucose-Capillary: 128 mg/dL — ABNORMAL HIGH (ref 70–99)

## 2020-08-07 MED ORDER — MAGNESIUM HYDROXIDE 400 MG/5ML PO SUSP
30.0000 mL | Freq: Every day | ORAL | Status: DC | PRN
  Administered 2020-08-07: 30 mL via ORAL
  Filled 2020-08-07: qty 30

## 2020-08-07 NOTE — Progress Notes (Addendum)
[name] [address]       [employer] [address]             [phone]      4 Days Post-Op Procedure(s) (LRB): CORONARY ARTERY BYPASS GRAFTING TIMES ONE USING LEFT INTERNAL MAMMARY ARTERY. (N/A) TRANSESOPHAGEAL ECHOCARDIOGRAM (TEE) (N/A) Subjective: Feels fair, says she is wheezing at times   Objective: Vital signs in last 24 hours: Temp:  [98.2 F (36.8 C)-98.7 F (37.1 C)] 98.7 F (37.1 C) (12/18 0358) Pulse Rate:  [87-100] 88 (12/18 0358) Cardiac Rhythm: Normal sinus rhythm (12/17 1900) Resp:  [18-24] 20 (12/18 0358) BP: (122-134)/(51-70) 134/70 (12/18 0358) SpO2:  [99 %-100 %] 99 % (12/18 0358) Weight:  [110 kg] 110 kg (12/18 0358)  Hemodynamic parameters for last 24 hours:    Intake/Output from previous day: 12/17 0701 - 12/18 0700 In: 0  Out: 3250 [Urine:3250] Intake/Output this shift: No intake/output data recorded.  General appearance: alert, cooperative and no distress Heart: regular rate and rhythm Lungs: clear to auscultation bilaterally Abdomen: benign Extremities: minor edema Wound: incis healing well  Lab Results: Recent Labs    08/06/20 0117 08/07/20 0122  WBC 18.5* 15.1*  HGB 8.5* 8.0*  HCT 24.8* 23.6*  PLT 248 294   BMET:  Recent Labs    08/06/20 0117 08/07/20 0122  NA 130* 134*  K 4.6 4.2  CL 99 102  CO2 22 22  GLUCOSE 121* 130*  BUN 5* 7*  CREATININE 0.63 0.54  CALCIUM 8.3* 8.5*    PT/INR: No results for input(s): LABPROT, INR in the last 72 hours. ABG    Component Value Date/Time   PHART 7.369 08/03/2020 1656   HCO3 23.1 08/03/2020 1656   TCO2 24 08/03/2020 1656   ACIDBASEDEF 2.0 08/03/2020 1656   O2SAT 99.0 08/03/2020 1656   CBG (last 3)  Recent Labs    08/06/20 1623 08/06/20 2135 08/07/20 0555  GLUCAP 111* 131* 120*    Meds Scheduled Meds: . aspirin EC  325 mg Oral Daily  . Chlorhexidine Gluconate Cloth  6 each Topical Daily  . enoxaparin (LOVENOX) injection  40 mg Subcutaneous QHS  .  ferrous [id number] C-folic acid  1 capsule Oral BID PC  . furosemide  40 mg Oral Daily  . insulin aspart  0-24 Units Subcutaneous TID AC & HS  . insulin detemir  20 Units Subcutaneous BID  . mouth rinse  15 mL Mouth Rinse BID  . metoprolol tartrate  25 mg Oral BID  . pantoprazole  40 mg Oral QAC breakfast  . potassium chloride  20 mEq Oral BID  . simvastatin  40 mg Oral QHS  . sodium chloride flush  3 mL Intravenous Q12H  . topiramate  100 mg Oral QHS  . umeclidinium-vilanterol  1 puff Inhalation Daily  . ziprasidone  40 mg Oral BID WC   Continuous Infusions: . sodium chloride     PRN Meds:.sodium chloride, acetaminophen, albuterol, ondansetron **OR** ondansetron (ZOFRAN) IV, oxyCODONE, senna-docusate, sodium chloride flush, traMADol  Xrays No results found.  Assessment/Plan: S/P Procedure(s) (LRB): CORONARY ARTERY BYPASS GRAFTING TIMES ONE USING LEFT INTERNAL MAMMARY ARTERY. (N/A) TRANSESOPHAGEAL ECHOCARDIOGRAM (TEE) (N/A)   1 afeb, VSS 2 NSR 3 sats good on 3 liters, conts current meds 4 excellent diuresis, weight up about 2 KG, cont current diuretics 5 sodium improved to 134, normal renal fxn 6 leukocytosis trending down 7 H/H slightly down from yesterday, may need to consider transfusion . Looks somewhat pale  and fatigues easily, remains on some O2 8 BS well controlled, transition to home meds at discharge- will need close monitoring as outpatient 9 cont pulm toilet and rehab    LOS: 4 days    John Giovanni PA-C Pager [phone] 08/07/2020 No ready for d/c yet, still on o2 hgb 8.0- if drops further will give RBC I have seen and examined [name] and agree with the above assessment  and plan.  Grace Isaac MD Beeper [phone] Office [phone] 08/07/2020 1:24 PM

## 2020-08-07 NOTE — Progress Notes (Signed)
Mobility Specialist - Progress Note   08/07/20 1231  Mobility  Activity Ambulated to bathroom  Level of Assistance Minimal assist, patient does 75% or more  Assistive Device Front wheel walker  Distance Ambulated (ft) 50 ft (Intervals: 25 ft x 2)  Mobility Response Tolerated fair  Mobility performed by Mobility specialist  $Mobility charge 1 Mobility    Pre-mobility: 88 HR, 100% SpO2 Post-mobility: 82 HR, 100% SpO2  Wheezing during ambulation. Pt assisted to toilet and back to recliner.   Pricilla Handler Mobility Specialist Mobility Specialist Phone: [phone]

## 2020-08-08 LAB — CBC
HCT: 26.5 % — ABNORMAL LOW (ref 36.0–46.0)
Hemoglobin: 8.5 g/dL — ABNORMAL LOW (ref 12.0–15.0)
MCH: 30.6 pg (ref 26.0–34.0)
MCHC: 32.1 g/dL (ref 30.0–36.0)
MCV: 95.3 fL (ref 80.0–100.0)
Platelets: 388 10*3/uL (ref 150–400)
RBC: 2.78 MIL/uL — ABNORMAL LOW (ref 3.87–5.11)
RDW: 13.6 % (ref 11.5–15.5)
WBC: 13.1 10*3/uL — ABNORMAL HIGH (ref 4.0–10.5)
nRBC: 0 % (ref 0.0–0.2)

## 2020-08-08 LAB — GLUCOSE, CAPILLARY
Glucose-Capillary: 107 mg/dL — ABNORMAL HIGH (ref 70–99)
Glucose-Capillary: 103 mg/dL — ABNORMAL HIGH (ref 70–99)
Glucose-Capillary: 103 mg/dL — ABNORMAL HIGH (ref 70–99)
Glucose-Capillary: 109 mg/dL — ABNORMAL HIGH (ref 70–99)

## 2020-08-08 MED ORDER — MAGNESIUM OXIDE 400 (241.3 MG) MG PO TABS
400.0000 mg | ORAL_TABLET | Freq: Two times a day (BID) | ORAL | Status: DC
  Administered 2020-08-08 – 2020-08-09 (×3): 400 mg via ORAL
  Filled 2020-08-08 (×3): qty 1

## 2020-08-08 MED ORDER — POTASSIUM CHLORIDE CRYS ER 20 MEQ PO TBCR
20.0000 meq | EXTENDED_RELEASE_TABLET | Freq: Two times a day (BID) | ORAL | Status: DC
  Administered 2020-08-08 – 2020-08-09 (×3): 20 meq via ORAL
  Filled 2020-08-08 (×3): qty 1

## 2020-08-08 MED ORDER — MAGNESIUM HYDROXIDE 400 MG/5ML PO SUSP: 30.0000 mL | Freq: Every day | ORAL | Status: DC | PRN

## 2020-08-08 MED ORDER — LISINOPRIL 5 MG PO TABS
5.0000 mg | ORAL_TABLET | Freq: Every day | ORAL | Status: DC
  Administered 2020-08-08 – 2020-08-09 (×2): 5 mg via ORAL
  Filled 2020-08-08 (×2): qty 1

## 2020-08-08 MED ORDER — FUROSEMIDE 40 MG PO TABS
40.0000 mg | ORAL_TABLET | Freq: Every day | ORAL | Status: DC
  Administered 2020-08-09: 40 mg via ORAL
  Filled 2020-08-08: qty 1

## 2020-08-08 NOTE — Progress Notes (Addendum)
[place] [address]       [employer] [address]             [phone]      5 Days Post-Op Procedure(s) (LRB): CORONARY ARTERY BYPASS GRAFTING TIMES ONE USING LEFT INTERNAL MAMMARY ARTERY. (N/A) TRANSESOPHAGEAL ECHOCARDIOGRAM (TEE) (N/A) Subjective: Se is quite happy she had a BM this am, c/o wheezing at times  Objective: Vital signs in last 24 hours: Temp:  [97.6 F (36.4 C)-98.5 F (36.9 C)] 98 F (36.7 C) (12/19 1134) Pulse Rate:  [78-88] 78 (12/19 1134) Cardiac Rhythm: Normal sinus rhythm (12/19 0701) Resp:  [15-20] 20 (12/19 1134) BP: (110-139)/(43-78) 121/64 (12/19 1134) SpO2:  [98 %-100 %] 98 % (12/19 1134) Weight:  [109.1 kg] 109.1 kg (12/19 0350)  Hemodynamic parameters for last 24 hours:    Intake/Output from previous day: 12/18 0701 - 12/19 0700 In: 240 [P.O.:240] Out: 1200 [Urine:1200] Intake/Output this shift: No intake/output data recorded.  General appearance: alert, cooperative and no distress Heart: regular rate and rhythm Lungs: clear currently without wheeze or rales Abdomen: benign Extremities: trace edema Wound: incis healing well  Lab Results: Recent Labs    08/07/20 0122 08/08/20 0837  WBC 15.1* 13.1*  HGB 8.0* 8.5*  HCT 23.6* 26.5*  PLT 294 388   BMET:  Recent Labs    08/06/20 0117 08/07/20 0122  NA 130* 134*  K 4.6 4.2  CL 99 102  CO2 22 22  GLUCOSE 121* 130*  BUN 5* 7*  CREATININE 0.63 0.54  CALCIUM 8.3* 8.5*    PT/INR: No results for input(s): LABPROT, INR in the last 72 hours. ABG    Component Value Date/Time   PHART 7.369 08/03/2020 1656   HCO3 23.1 08/03/2020 1656   TCO2 24 08/03/2020 1656   ACIDBASEDEF 2.0 08/03/2020 1656   O2SAT 99.0 08/03/2020 1656   CBG (last 3)  Recent Labs    08/07/20 2211 08/08/20 0636 08/08/20 1133  GLUCAP 128* 107* 103*    Meds Scheduled Meds: . aspirin EC  325 mg Oral Daily  . Chlorhexidine Gluconate Cloth  6 each Topical Daily  . enoxaparin (LOVENOX)  injection  40 mg Subcutaneous QHS  . ferrous [id number] C-folic acid  1 capsule Oral BID PC  . furosemide  40 mg Oral Daily  . insulin aspart  0-24 Units Subcutaneous TID AC & HS  . insulin detemir  20 Units Subcutaneous BID  . lisinopril  5 mg Oral Daily  . magnesium oxide  400 mg Oral BID  . mouth rinse  15 mL Mouth Rinse BID  . metoprolol tartrate  25 mg Oral BID  . pantoprazole  40 mg Oral QAC breakfast  . potassium chloride  20 mEq Oral BID  . simvastatin  40 mg Oral QHS  . sodium chloride flush  3 mL Intravenous Q12H  . topiramate  100 mg Oral QHS  . umeclidinium-vilanterol  1 puff Inhalation Daily  . ziprasidone  40 mg Oral BID WC   Continuous Infusions: . sodium chloride     PRN Meds:.sodium chloride, acetaminophen, albuterol, magnesium hydroxide, ondansetron **OR** ondansetron (ZOFRAN) IV, oxyCODONE, senna-docusate, sodium chloride flush, traMADol  Xrays No results found.  Assessment/Plan: S/P Procedure(s) (LRB): CORONARY ARTERY BYPASS GRAFTING TIMES ONE USING LEFT INTERNAL MAMMARY ARTERY. (N/A) TRANSESOPHAGEAL ECHOCARDIOGRAM (TEE) (N/A)  1 afeb, VSS, BP control mostly good with some systolic HTN , will add low dose ACE- I- was on lisinopril 5 mg at home. Sinus rhythm  2 sats good on 3 liters- cont to try to wean, desats at times, cont nebs/pulm toilet 3 will recheck CBC to monitor ABLA and if HCT lower may need transfusion assist with O2 carrying capacity 4 Minor volume overload, will continue lasix for now, add mag oxide 5 d/c wires today 6 cardiac rehab 7 hopefully home 1-2 days  LOS: 5 days   H/h 8.5 26.5 % today- hold off on transfusion    Grace Isaac PA-C Pager [phone] 08/08/2020

## 2020-08-08 NOTE — Progress Notes (Signed)
Pacer wires removed per physician order. Patient tolerated well. BP taken before removal and set to take every 15 mins for first hour. Vital signs stable. Will continue to monitor.   -Evon Slack, RN

## 2020-08-08 NOTE — Progress Notes (Signed)
Mobility Specialist - Progress Note   08/08/20 1312  Mobility  Activity Ambulated to bathroom  Level of Assistance Minimal assist, patient does 75% or more  Assistive Device Front wheel walker  Distance Ambulated (ft) 40 ft (Intervals: 20 ft x 1, 10 ft x 2)  Mobility Response Tolerated fair  Mobility performed by Mobility specialist  $Mobility charge 1 Mobility    Pre-mobility: 77 HR, 100% SpO2 During mobility: 91 HR Post-mobility: 85 HR, 100% SpO2  Pt ambulated on 3L O2 from recliner to bathroom and back. When returning to the recliner she required a seated rest break due to wheezing.   Pricilla Handler Mobility Specialist Mobility Specialist Phone: [phone]

## 2020-08-09 ENCOUNTER — Ambulatory Visit: Payer: Medicare HMO

## 2020-08-09 LAB — GLUCOSE, CAPILLARY
Glucose-Capillary: 122 mg/dL — ABNORMAL HIGH (ref 70–99)
Glucose-Capillary: 91 mg/dL (ref 70–99)

## 2020-08-09 MED ORDER — INSULIN NPH (HUMAN) (ISOPHANE) 100 UNIT/ML ~~LOC~~ SUSP: 40.0000 [IU] | Freq: Every day | SUBCUTANEOUS | Status: DC

## 2020-08-09 MED ORDER — POTASSIUM CHLORIDE CRYS ER 20 MEQ PO TBCR: 20.0000 meq | EXTENDED_RELEASE_TABLET | Freq: Every day | ORAL | 0 refills | Status: DC

## 2020-08-09 MED ORDER — METOPROLOL TARTRATE 25 MG PO TABS: 25.0000 mg | ORAL_TABLET | Freq: Two times a day (BID) | ORAL | 1 refills | Status: DC

## 2020-08-09 MED ORDER — MAGNESIUM OXIDE 400 (241.3 MG) MG PO TABS: 400.0000 mg | ORAL_TABLET | Freq: Two times a day (BID) | ORAL | 0 refills | Status: DC

## 2020-08-09 MED ORDER — ASPIRIN 325 MG PO TBEC: 325.0000 mg | DELAYED_RELEASE_TABLET | Freq: Every day | ORAL | Status: DC

## 2020-08-09 MED ORDER — FUROSEMIDE 40 MG PO TABS: 40.0000 mg | ORAL_TABLET | Freq: Every day | ORAL | 0 refills | Status: DC

## 2020-08-09 MED ORDER — ZIPRASIDONE HCL 40 MG PO CAPS: 40.0000 mg | ORAL_CAPSULE | Freq: Two times a day (BID) | ORAL | Status: DC

## 2020-08-09 MED ORDER — TRAMADOL HCL 50 MG PO TABS: 50.0000 mg | ORAL_TABLET | Freq: Four times a day (QID) | ORAL | 0 refills | Status: AC | PRN

## 2020-08-09 NOTE — Progress Notes (Addendum)
      [address]       [place] [address]             [phone]      6 Days Post-Op Procedure(s) (LRB): CORONARY ARTERY BYPASS GRAFTING TIMES ONE USING LEFT INTERNAL MAMMARY ARTERY. (N/A) TRANSESOPHAGEAL ECHOCARDIOGRAM (TEE) (N/A) Subjective: Feels okay, on oxygen this morning but has oxygen a home.  Objective: Vital signs in last 24 hours: Temp:  [97.6 F (36.4 C)-98 F (36.7 C)] 97.6 F (36.4 C) (12/20 0324) Pulse Rate:  [71-88] 81 (12/20 0324) Cardiac Rhythm: Normal sinus rhythm (12/19 1920) Resp:  [20] 20 (12/20 0324) BP: (100-129)/(41-71) 128/71 (12/20 0324) SpO2:  [98 %-100 %] 98 % (12/20 0324) Weight:  [108.6 kg] 108.6 kg (12/20 0324)    Intake/Output from previous day: 12/19 0701 - 12/20 0700 In: 720 [P.O.:720] Out: 500 [Urine:500] Intake/Output this shift: No intake/output data recorded.  General appearance: alert, cooperative and no distress Heart: regular rate and rhythm, S1, S2 normal, no murmur, click, rub or gallop Lungs: clear to auscultation bilaterally Abdomen: soft, non-tender; bowel sounds normal; no masses,  no organomegaly Extremities: extremities normal, atraumatic, no cyanosis or edema Wound: clean and dry  Lab Results: Recent Labs    08/07/20 0122 08/08/20 0837  WBC 15.1* 13.1*  HGB 8.0* 8.5*  HCT 23.6* 26.5*  PLT 294 388   BMET:  Recent Labs    08/07/20 0122  NA 134*  K 4.2  CL 102  CO2 22  GLUCOSE 130*  BUN 7*  CREATININE 0.54  CALCIUM 8.5*    PT/INR: No results for input(s): LABPROT, INR in the last 72 hours. ABG    Component Value Date/Time   PHART 7.369 08/03/2020 1656   HCO3 23.1 08/03/2020 1656   TCO2 24 08/03/2020 1656   ACIDBASEDEF 2.0 08/03/2020 1656   O2SAT 99.0 08/03/2020 1656   CBG (last 3)  Recent Labs    08/08/20 1625 08/08/20 2128 08/09/20 0617  GLUCAP 103* 109* 122*    Assessment/Plan: S/P Procedure(s) (LRB): CORONARY ARTERY BYPASS GRAFTING TIMES ONE USING LEFT INTERNAL  MAMMARY ARTERY. (N/A) TRANSESOPHAGEAL ECHOCARDIOGRAM (TEE) (N/A)  1. CV-NSR in 70s, BP well controlled. Continue asa. Lisinopril. Statin allergy 2. Pulm-last CXR showsBibasilar opacities and bilateral pleural effusions are unchanged 3. Renal- no recent labs, last creatinine stable. 4. H and H 8.5/26.5, stable 5. Endo-blood glucose well controlled  Plan: EPW removed yesterday. She feels okay this morning. Remains a few pounds fluid overloaded. Encouraged to ambulate.    LOS: 6 days    Elgie Collard 08/09/2020   Chart reviewed, patient examined, agree with above. She feels well except for back pain from sleeping on the hospital bed. She wants to go home and I think that is fine. She is still on a little oxygen but uses it chronically at home. She is only 1 lb over her baseline wt of 238 lbs. CXR a couple days ago looked fine. Plan home today.

## 2020-08-09 NOTE — Progress Notes (Signed)
Mobility Specialist: Progress Note   08/09/20 1247  Mobility  Activity  (Cancel)   Pt prepping for discharge.   [facility] Chanique Duca Mobility Specialist

## 2020-08-09 NOTE — Care Management Important Message (Signed)
Important Message  Patient Details  Name: [name] MRN: [id number] Date of Birth: 1951-07-15   Medicare Important Message Given:  Yes     Shelda Altes 08/09/2020, 11:30 AM

## 2020-08-09 NOTE — Progress Notes (Signed)
CARDIAC REHAB PHASE I   D/c education completed with pt. Pt educated on importance of site care and monitoring incision daily. Encouraged continued IS use, sternal precautions and ambulation with emphasis on safety. Pt states she has DME at home. Reviewed restrictions. Will refer to CRP II [place].    [phone] Rufina Falco, RN BSN 08/09/2020 10:54 AM

## 2020-08-09 NOTE — Progress Notes (Signed)
D/C instructions given to patient. Wound care and medications reviewed with patient. All questions answered. IV removed, clean and intact. Husband to escort pt home with all belongings.  Clyde Canterbury, RN

## 2020-08-10 ENCOUNTER — Encounter: Payer: Self-pay | Admitting: Family Medicine

## 2020-08-12 ENCOUNTER — Other Ambulatory Visit: Payer: Self-pay

## 2020-08-12 ENCOUNTER — Ambulatory Visit (INDEPENDENT_AMBULATORY_CARE_PROVIDER_SITE_OTHER): Payer: Self-pay

## 2020-08-12 DIAGNOSIS — Z4802 Encounter for removal of sutures: Secondary | ICD-10-CM

## 2020-08-12 NOTE — Telephone Encounter (Signed)
Routing to Dr. Gonzalez as an FYI 

## 2020-08-12 NOTE — Progress Notes (Signed)
Patient arrived for nurse visit to remove suture post- procedure CABG with Dr. Cyndia Bent 08/03/20.  Sutures removed with no signs/ symptoms of infection noted. Incisions well approximated even after suture removal.  Patient advised that she could wear a bra without underwire if she was more comfortable doing so. Patient tolerated procedure well.  Patient instructed to keep the incision sites clean and dry.  Incicions healing well.  Patient acknowledged instructions given. Patient is aware of her follow-up appointment with Dr. Cyndia Bent.

## 2020-08-16 ENCOUNTER — Other Ambulatory Visit: Payer: Self-pay | Admitting: Family Medicine

## 2020-08-19 ENCOUNTER — Other Ambulatory Visit: Payer: Self-pay | Admitting: Family Medicine

## 2020-08-19 NOTE — Telephone Encounter (Signed)
Pharmacy requests refill on: Hydrochlorothiazide 25 mg   LAST REFILL: 05/24/2020 (Q-90, R-0) LAST OV: 05/25/2020 NEXT OV: 09/27/2020 PHARMACY: [facility] [place], [place]

## 2020-08-23 ENCOUNTER — Ambulatory Visit: Payer: Medicare HMO | Admitting: Neurology

## 2020-08-26 ENCOUNTER — Other Ambulatory Visit: Payer: Self-pay

## 2020-08-26 ENCOUNTER — Ambulatory Visit: Payer: Medicare HMO | Admitting: Nurse Practitioner

## 2020-08-26 ENCOUNTER — Encounter: Payer: Self-pay | Admitting: Nurse Practitioner

## 2020-08-26 VITALS — BP 100/50 | HR 84 | Ht 62.0 in | Wt 223.0 lb

## 2020-08-26 DIAGNOSIS — I5020 Unspecified systolic (congestive) heart failure: Secondary | ICD-10-CM

## 2020-08-26 DIAGNOSIS — E785 Hyperlipidemia, unspecified: Secondary | ICD-10-CM

## 2020-08-26 DIAGNOSIS — I2510 Atherosclerotic heart disease of native coronary artery without angina pectoris: Secondary | ICD-10-CM

## 2020-08-26 DIAGNOSIS — I10 Essential (primary) hypertension: Secondary | ICD-10-CM

## 2020-08-26 DIAGNOSIS — D649 Anemia, unspecified: Secondary | ICD-10-CM | POA: Diagnosis not present

## 2020-08-26 DIAGNOSIS — I255 Ischemic cardiomyopathy: Secondary | ICD-10-CM | POA: Diagnosis not present

## 2020-08-26 DIAGNOSIS — I951 Orthostatic hypotension: Secondary | ICD-10-CM

## 2020-08-26 MED ORDER — LISINOPRIL 2.5 MG PO TABS: 2.5000 mg | ORAL_TABLET | Freq: Every day | ORAL | 3 refills | Status: DC

## 2020-08-26 MED ORDER — NYSTATIN 100000 UNIT/GM EX CREA: TOPICAL_CREAM | CUTANEOUS | 1 refills | Status: DC

## 2020-08-26 NOTE — Patient Instructions (Signed)
Medication Instructions:  Your physician has recommended you make the following change in your medication:   DECREASE Lisinopril 2.5mg  take one tablet daily - A new Rx has been sent to your local pharmacy  Nystatin has been refilled  *If you need a refill on your cardiac medications before your next appointment, please call your pharmacy*   Lab Work: Your physician recommends that you have lab work TODAY: CBC, Bmet   If you have labs (blood work) drawn today and your tests are completely normal, you will receive your results only by: Marland Kitchen MyChart Message (if you have MyChart) OR . A paper copy in the mail If you have any lab test that is abnormal or we need to change your treatment, we will call you to review the results.   Testing/Procedures: None ordered   Follow-Up: At [facility], you and your health needs are our priority.  As part of our continuing mission to provide you with exceptional heart care, we have created designated Provider Care Teams.  These Care Teams include your primary Cardiologist (physician) and Advanced Practice Providers (APPs -  Physician Assistants and Nurse Practitioners) who all work together to provide you with the care you need, when you need it.  We recommend signing up for the patient portal called "MyChart".  Sign up information is provided on this After Visit Summary.  MyChart is used to connect with patients for Virtual Visits (Telemedicine).  Patients are able to view lab/test results, encounter notes, upcoming appointments, etc.  Non-urgent messages can be sent to your provider as well.   To learn more about what you can do with MyChart, go to [email or web address].    Your next appointment:   6 week(s)  The format for your next appointment:   In Person  Provider:   You may see Debbe Odea, MD or one of the following Advanced Practice Providers on your designated Care Team:    Nicolasa Ducking, NP

## 2020-08-26 NOTE — Progress Notes (Signed)
Office Visit    Patient Name: [name] Date of Encounter: 08/26/2020  Primary Care Provider:  Eustaquio Boyden, MD Primary Cardiologist:  Debbe Odea, MD  Chief Complaint    70 year old female with a history of CAD, hypertension, hyperlipidemia, diabetes, heart failure with midrange ejection fraction, ischemic cardiomyopathy, remote tobacco abuse, COPD, sleep apnea, obesity, and GERD, who presents for after recent one-vessel coronary artery bypass grafting.  Past Medical History    Past Medical History:  Diagnosis Date  . Anxiety   . Arthritis    per patient "all over body"  . Bipolar disorder (HCC)    has stopped seeing psychiatrist  . CAD (coronary artery disease)    a. 06/2020 MV: small, sev, apical inf/apical defect-scar vs ischemia. EF 45%; b. 06/2020 Cath: LM 80m, LAD 70ost, 61m, LCX nl, RCA nl; c. 07/2020 CABG x 1: LIMA->LAD.  Marland Kitchen Cataract   . Chronic bronchitis   . COPD (chronic obstructive pulmonary disease) (HCC)   . Coronary artery disease, non-occlusive   . Depression   . Dyspnea 2012   s/p pulm/cards w/u WNL, thought due to obesity/deconditioning  . Fibromyalgia   . GERD (gastroesophageal reflux disease)   . HFmrEF (heart failure with mid-range ejection fraction) (HCC)    a. 05/2020 Echo: EF 40%, glob HK, mild LVH, Gr1 DD; b. 06/2020 EF by SPECT: 45%; c. 07/2020 intraop TEE: EF 50%.  . History of chicken pox   . HLD (hyperlipidemia)   . Hypertension   . Hypothyroidism   . Ischemic cardiomyopathy    a. 05/2020 Echo: EF 40%; b. 06/2020 EF by SPECT: 45%; c. 07/2020 intraop TEE: EF 50%.  . Migraine   . OSA (obstructive sleep apnea) 03/16/2011  . Pneumonia   . Sleep apnea    per patient cannot tolerate CPAP  . T2DM (type 2 diabetes mellitus) (HCC) 2012   DM education 06/2011  . Urinary incontinence    Past Surgical History:  Procedure Laterality Date  . BREAST CYST ASPIRATION Right 2003  . cardiac catherization  03/2011   x3 [facility]), mild  nonobstructive CAD  . COLONOSCOPY  12/2011   hyperplastic polyp x2,. diverticulosis, rec rpt 10 yrs  . CORONARY ARTERY BYPASS GRAFT N/A 08/03/2020   Procedure: CORONARY ARTERY BYPASS GRAFTING TIMES ONE USING LEFT INTERNAL MAMMARY ARTERY.;  Surgeon: Alleen Borne, MD;  Location: [facility] OR;  Service: Open Heart Surgery;  Laterality: N/A;  . ESOPHAGOGASTRODUODENOSCOPY  2006  . KNEE SURGERY  2006   left, torn meniscus  . RIGHT/LEFT HEART CATH AND CORONARY ANGIOGRAPHY N/A 07/13/2020   Procedure: RIGHT/LEFT HEART CATH AND CORONARY ANGIOGRAPHY;  Surgeon: Yvonne Kendall, MD;  Location: [facility] INVASIVE CV LAB;  Service: Cardiovascular;  Laterality: N/A;  . TEE WITHOUT CARDIOVERSION N/A 08/03/2020   Procedure: TRANSESOPHAGEAL ECHOCARDIOGRAM (TEE);  Surgeon: Alleen Borne, MD;  Location: [facility] OR;  Service: Open Heart Surgery;  Laterality: N/A;  . TOTAL ABDOMINAL HYSTERECTOMY  2004   fibroids, cervix remained  . TOTAL KNEE ARTHROPLASTY Left 08/2012   Katrinka Blazing, [place] [facility]    Allergies  Allergies  Allergen Reactions  . Crestor [Rosuvastatin] Other (See Comments)    myalgias  . Lipitor [Atorvastatin] Other (See Comments)    Mood swings  . Lithium Other (See Comments)    abd pain, n/v, was hospitalized  . Quetiapine Other (See Comments)    Muscle twitching, muscle spasm    History of Present Illness    70 year old female with above complex past medical  history including coronary artery disease, hypertension, hyperlipidemia, diabetes, heart failure with midrange ejection fraction, ischemic cardiomyopathy, remote tobacco abuse, COPD, sleep apnea, obesity, and GERD.  In the setting of generalized weakness and progressive dyspnea on exertion, she was seen by pulmonology in the fall 2021.  An echocardiogram was preferred and though imaging was poor, it was felt that the EF looked to be 40%.  She was referred to cardiology with repeat limited echo again showing difficult images.  Stress testing was  undertaken and showed an EF of 45% with an area of apical inferior and apical scar versus ischemia.  She underwent diagnostic catheterization in November showing a 70% ostial LAD stenosis along with a 90% mid LAD stenosis.  She otherwise had nonobstructive disease.  She was referred to thoracic surgery and underwent CABG x1 with a LIMA to the LAD on December 14.  Of note, EF was 50% by intraoperative TEE.  Postoperative course was notable for anemia, though she did not require transfusion.  She also continues to have higher oxygen demands and was discharged home on O2 via nasal cannula to be used around-the-clock (previously was using only at night).  She has since been able to return to using oxygen only at night and typically has saturations in the 90s during the day.  She continues to experience dyspnea with relatively minimal activity, noting that she can walk about 25 feet before having to rest.  This is similar to what she experienced prior to surgery.  She has also felt fatigued and occasionally lightheaded with position changes.  She believes her blood pressure is running too low.  She does not experience chest pain.  When she exerts, she notes an elevation in heart rates but not the slight palpitations.  She has a history of recurrent yeast infections under her breasts and she has recently had a flareup.  She has been using nystatin cream and needs a refill.  She denies PND, orthopnea, syncope, edema, or early satiety.  She is looking forward to enrolling in cardiac rehab once cleared.  Home Medications    Prior to Admission medications   Medication Sig Start Date End Date Taking? Authorizing Provider  albuterol (VENTOLIN HFA) 108 (90 Base) MCG/ACT inhaler Inhale 2 puffs into the lungs every 6 (six) hours as needed for wheezing or shortness of breath. 06/07/20   Salena Saner, MD  Apple Cider Vinegar 600 MG CAPS SMARTSIG:By Mouth 07/08/20   [provider]  aspirin EC 325 MG EC tablet  Take 1 tablet (325 mg total) by mouth daily. 08/09/20   Roddenberry, Cecille Amsterdam, PA-C  Boswellia-Glucosamine-Vit D (OSTEO BI-FLEX ONE PER DAY PO) Take 1 tablet by mouth daily.     [provider]  butalbital-acetaminophen-caffeine (FIORICET) 50-325-40 MG tablet Take 1 tablet by mouth 2 (two) times daily as needed for headache or migraine. 05/26/20 05/26/21  Eustaquio Boyden, MD  famotidine (PEPCID) 20 MG tablet Take 20 mg by mouth every other day. Alternates with omeprazole    [provider]  glimepiride (AMARYL) 1 MG tablet Take 1 tablet (1 mg total) by mouth daily with breakfast. 03/22/20   Eustaquio Boyden, MD  hydrochlorothiazide (HYDRODIURIL) 25 MG tablet TAKE 1 TABLET DAILY 08/19/20   Eustaquio Boyden, MD  INS SYRINGE/NEEDLE 1CC/28G (B-D INS SYR MICROFINE 1CC/28G) 28G X 1/2" 1 ML MISC USE AS DIRECTED 12/15/19   Eustaquio Boyden, MD  insulin NPH Human (NOVOLIN N) 100 UNIT/ML injection Inject 0.4 mLs (40 Units total) into the skin  daily after supper. 08/09/20   Leary Roca, PA-C  levothyroxine (SYNTHROID) 100 MCG tablet TAKE 1 TABLET DAILY, AND ON2 DAYS A WEEK, TAKE AN     EXTRA 1/2 TABLET 08/02/20   Eustaquio Boyden, MD  lisinopril (ZESTRIL) 5 MG tablet TAKE 1 TABLET DAILY Patient taking differently: Take 5 mg by mouth daily. 07/02/20   Eustaquio Boyden, MD  loratadine (CLARITIN) 10 MG tablet Take 1 tablet (10 mg total) by mouth daily. 03/22/20   Eustaquio Boyden, MD  magnesium oxide (MAG-OX) 400 (241.3 Mg) MG tablet Take 1 tablet (400 mg total) by mouth 2 (two) times daily. 08/09/20   Leary Roca, PA-C  metFORMIN (GLUCOPHAGE) 500 MG tablet Take 2 tablets (1,000 mg total) by mouth in the morning and at bedtime. 07/16/20   End, Cristal Deer, MD  metoprolol tartrate (LOPRESSOR) 25 MG tablet Take 1 tablet (25 mg total) by mouth 2 (two) times daily. 08/09/20   Leary Roca, PA-C  Multiple Vitamin (MULTIVITAMIN WITH MINERALS) TABS tablet Take 1 tablet by mouth  daily. One A Day for Women    [provider]  nystatin cream (MYCOSTATIN) APPLY TO AFFECTED AREA TWICE A DAY Patient taking differently: Apply 1 application topically 2 (two) times daily as needed (yeast). 04/24/19   Eustaquio Boyden, MD  omeprazole (PRILOSEC) 20 MG capsule TAKE 1 DAILY-ALTERNATES WITH FAMOTIDINE 08/16/20   Eustaquio Boyden, MD  ONE [facility] LANCETS MISC Use to check sugar twice daily Dx: E11.8 11/15/16   Eustaquio Boyden, MD  [facility] ULTRA test strip USE TO CHECK BLOOD SUGAR 2 TIMES   DAILY 08/23/20   Eustaquio Boyden, MD  OXYGEN Inhale 2 L into the lungs at bedtime.    [provider]  pioglitazone (ACTOS) 15 MG tablet TAKE 1 TABLET DAILY Patient taking differently: Take 15 mg by mouth daily. 07/23/20   Eustaquio Boyden, MD  Rhubarb (ESTROVEN COMPLETE PO) Take 1 tablet by mouth daily.    [provider]  simvastatin (ZOCOR) 40 MG tablet TAKE 1 TABLET AT BEDTIME Patient taking differently: Take 40 mg by mouth at bedtime. 07/02/20   Eustaquio Boyden, MD  topiramate (TOPAMAX) 100 MG tablet Take 1 tablet (100 mg total) by mouth at bedtime. Patient taking differently: Take 100 mg by mouth at bedtime. 07/01/20   Eustaquio Boyden, MD  topiramate (TOPAMAX) 50 MG tablet Take by mouth. 07/08/20   [provider]  umeclidinium-vilanterol (ANORO ELLIPTA) 62.5-25 MCG/INH AEPB Inhale 1 puff into the lungs daily. 04/30/20   Salena Saner, MD  ziprasidone (GEODON) 40 MG capsule Take 1 capsule (40 mg total) by mouth in the morning and at bedtime. 08/09/20   Leary Roca, PA-C    Review of Systems    Ongoing dyspnea on exertion.  She has been noticing fatigue and also lightheadedness with position changes.  Recent fungal infection under her breasts.  She denies chest pain, palpitations, PND, orthopnea, dizziness, syncope, edema, or early satiety.  All other systems reviewed and are otherwise negative except as noted above.  Physical Exam     VS:  BP (!) 100/50 (BP Location: Right Arm, Patient Position: Sitting, Cuff Size: Normal)   Pulse 84   Ht 5\' 2"  (1.575 m)   Wt 223 lb (101.2 kg)   SpO2 98%   BMI 40.79 kg/m  , BMI Body mass index is 40.79 kg/m. GEN: Obese, in no acute distress. HEENT: normal. Neck: Supple, no JVD, carotid bruits, or masses. Cardiac: RRR, no murmurs, rubs, or gallops.  No clubbing, cyanosis, edema.  Radials/DP/PT 2+ and equal bilaterally.  Chest wall midline incision is healing well without erythema or drainage.   Respiratory:  Respirations regular and unlabored, clear to auscultation bilaterally. GI: Soft, nontender, nondistended, BS + x 4. MS: no deformity or atrophy. Skin: warm and dry, no rash. Neuro:  Strength and sensation are intact. Psych: Normal affect.  Accessory Clinical Findings    ECG personally reviewed by me today -regular sinus rhythm, 84, baseline artifact, inferolateral T wave inversion- no acute changes.  Lab Results  Component Value Date   WBC 13.1 (H) 08/08/2020   HGB 8.5 (L) 08/08/2020   HCT 26.5 (L) 08/08/2020   MCV 95.3 08/08/2020   PLT 388 08/08/2020   Lab Results  Component Value Date   CREATININE 0.54 08/07/2020   BUN 7 (L) 08/07/2020   NA 134 (L) 08/07/2020   K 4.2 08/07/2020   CL 102 08/07/2020   CO2 22 08/07/2020   Lab Results  Component Value Date   ALT 20 08/02/2020   AST 20 08/02/2020   ALKPHOS 40 08/02/2020   BILITOT 0.6 08/02/2020   Lab Results  Component Value Date   CHOL 131 05/21/2020   HDL 42.80 05/21/2020   LDLCALC 48 12/08/2013   LDLDIRECT 65.0 05/21/2020   TRIG 239.0 (H) 05/21/2020   CHOLHDL 3 05/21/2020    Lab Results  Component Value Date   HGBA1C 5.9 (H) 07/30/2020    Assessment & Plan    1.  Coronary artery disease: Status post one-vessel bypass in December.  She has not been having any chest pain but continues to experience dyspnea on exertion.  I suspect that long-term COPD and deconditioning is playing the greatest role at  this time.  She remains on aspirin, beta-blocker, ACE inhibitor, and statin therapy.  She is looking forward to participating cardiac rehabilitation once cleared.  With ongoing dyspnea and postoperative anemia, I will follow-up labs today.  2.  Orthostasis: Patient has been experiencing intermittent dizziness with position changes as well as fatigue.  Blood pressure is 100/50 today.  I have asked her to reduce her lisinopril to 2.5 mg daily.  I had considered discontinuing HCTZ however, she does have a history of experiencing mild ankle edema.  3.  Heart failure with midrange ejection fraction/ischemic cardiomyopathy: EF 50% by intraoperative TEE.  She is euvolemic on examination.  Adequate heart rate and blood pressure control.  If anything, blood pressure soft as above and reducing lisinopril to 2.5 mg daily.  4.  Essential hypertension: See #2.  5.  Hyperlipidemia: LDL of 65 in October.  Continue statin therapy.  6.  Postoperative normocytic anemia: Follow-up CBC today.  7.  COPD: Certainly contributing to ongoing dyspnea on exertion.  Not actively wheezing.  Using O2 at night.  8.  Disposition: Follow-up CBC and basic metabolic panel today.  Follow-up in approximately 4 to 6 weeks.   Murray Hodgkins, NP 08/26/2020, 6:34 PM

## 2020-08-27 ENCOUNTER — Telehealth: Payer: Self-pay | Admitting: *Deleted

## 2020-08-27 ENCOUNTER — Encounter: Payer: Self-pay | Admitting: Family Medicine

## 2020-08-27 LAB — BASIC METABOLIC PANEL
BUN/Creatinine Ratio: 10 — ABNORMAL LOW (ref 12–28)
BUN: 9 mg/dL (ref 8–27)
CO2: 20 mmol/L (ref 20–29)
Calcium: 9.4 mg/dL (ref 8.7–10.3)
Chloride: 99 mmol/L (ref 96–106)
Creatinine, Ser: 0.87 mg/dL (ref 0.57–1.00)
GFR calc Af Amer: 80 mL/min/{1.73_m2} (ref >59)
GFR calc non Af Amer: 69 mL/min/{1.73_m2} (ref >59)
Glucose: 130 mg/dL — ABNORMAL HIGH (ref 65–99)
Potassium: 3.7 mmol/L (ref 3.5–5.2)
Sodium: 137 mmol/L (ref 134–144)

## 2020-08-27 LAB — CBC
Hematocrit: 33.6 % — ABNORMAL LOW (ref 34.0–46.6)
Hemoglobin: 11 g/dL — ABNORMAL LOW (ref 11.1–15.9)
MCH: 29.7 pg (ref 26.6–33.0)
MCHC: 32.7 g/dL (ref 31.5–35.7)
MCV: 91 fL (ref 79–97)
Platelets: 652 10*3/uL — ABNORMAL HIGH (ref 150–450)
RBC: 3.7 x10E6/uL — ABNORMAL LOW (ref 3.77–5.28)
RDW: 13.2 % (ref 11.7–15.4)
WBC: 17.5 10*3/uL — ABNORMAL HIGH (ref 3.4–10.8)

## 2020-08-27 NOTE — Telephone Encounter (Signed)
-----   Message from Theora Gianotti, NP sent at 08/27/2020  7:11 AM EST ----- Blood counts are stable (h/o chronically elevated wbc/plts - prev eval by hematology). Kidney function and lytes stable.

## 2020-08-27 NOTE — Telephone Encounter (Signed)
No answer/No voicemail box is set up 

## 2020-08-27 NOTE — Telephone Encounter (Signed)
Reviewed results with patient and she verbalized understanding with no further questions at this time.  

## 2020-08-29 ENCOUNTER — Other Ambulatory Visit: Payer: Self-pay | Admitting: Physician Assistant

## 2020-08-31 ENCOUNTER — Other Ambulatory Visit: Payer: Self-pay | Admitting: *Deleted

## 2020-08-31 DIAGNOSIS — Z951 Presence of aortocoronary bypass graft: Secondary | ICD-10-CM

## 2020-09-01 ENCOUNTER — Encounter: Payer: Medicare HMO | Attending: Internal Medicine | Admitting: *Deleted

## 2020-09-01 ENCOUNTER — Encounter: Payer: Self-pay | Admitting: *Deleted

## 2020-09-01 ENCOUNTER — Ambulatory Visit
Admission: RE | Admit: 2020-09-01 | Discharge: 2020-09-01 | Disposition: A | Discharge status: Home / Self Care | Payer: Medicare HMO | Source: Ambulatory Visit | Attending: Surgery | Admitting: Surgery

## 2020-09-01 ENCOUNTER — Ambulatory Visit (INDEPENDENT_AMBULATORY_CARE_PROVIDER_SITE_OTHER): Payer: Self-pay | Admitting: Surgery

## 2020-09-01 ENCOUNTER — Other Ambulatory Visit: Payer: Self-pay

## 2020-09-01 ENCOUNTER — Encounter: Payer: Self-pay | Admitting: Surgery

## 2020-09-01 VITALS — BP 108/68 | HR 84 | Resp 20 | Ht 62.0 in | Wt 218.0 lb

## 2020-09-01 DIAGNOSIS — Z951 Presence of aortocoronary bypass graft: Secondary | ICD-10-CM

## 2020-09-01 DIAGNOSIS — R0602 Shortness of breath: Secondary | ICD-10-CM | POA: Diagnosis not present

## 2020-09-01 NOTE — Progress Notes (Signed)
HPI: Patient returns for routine postoperative follow-up having undergone coronary artery bypass graft surgery x1 on 08/03/2020. The patient's early postoperative recovery while in the hospital was notable for an uncomplicated postoperative course. Since hospital discharge the patient reports that she has been feeling fairly well.  She is walking short distances without chest pain or shortness of breath.  She has had no cough or sputum production.  She denies any fever or chills.  Her husband is with her today.   Current Outpatient Medications  Medication Sig Dispense Refill  . albuterol (VENTOLIN HFA) 108 (90 Base) MCG/ACT inhaler Inhale 2 puffs into the lungs every 6 (six) hours as needed for wheezing or shortness of breath. 8 g 3  . Apple Cider Vinegar 600 MG CAPS SMARTSIG:By Mouth    . aspirin EC 325 MG EC tablet Take 1 tablet (325 mg total) by mouth daily.    . [place]well[place]-Glucosamine-Vit D (OSTEO BI-FLEX ONE PER DAY PO) Take 1 tablet by mouth daily.     . butalbital-acetaminophen-caffeine (FIORICET) 50-325-40 MG tablet Take 1 tablet by mouth 2 (two) times daily as needed for headache or migraine. 20 tablet 1  . famotidine (PEPCID) 20 MG tablet Take 20 mg by mouth every other day. Alternates with omeprazole    . glimepiride (AMARYL) 1 MG tablet Take 1 tablet (1 mg total) by mouth daily with breakfast. 90 tablet 1  . hydrochlorothiazide (HYDRODIURIL) 25 MG tablet TAKE 1 TABLET DAILY 90 tablet 1  . INS SYRINGE/NEEDLE 1CC/28G (B-D INS SYR MICROFINE 1CC/28G) 28G X 1/2" 1 ML MISC USE AS DIRECTED 100 each 3  . insulin NPH Human (NOVOLIN N) 100 UNIT/ML injection Inject 0.4 mLs (40 Units total) into the skin daily after supper.    . levothyroxine (SYNTHROID) 100 MCG tablet TAKE 1 TABLET DAILY, AND ON2 DAYS A WEEK, TAKE AN     EXTRA 1/2 TABLET 90 tablet 3  . lisinopril (ZESTRIL) 2.5 MG tablet Take 1 tablet (2.5 mg total) by mouth daily. 90 tablet 3  . loratadine (CLARITIN) 10 MG tablet Take 1  tablet (10 mg total) by mouth daily.    . magnesium oxide (MAG-OX) 400 (241.3 Mg) MG tablet Take 1 tablet (400 mg total) by mouth 2 (two) times daily. 60 tablet 0  . metFORMIN (GLUCOPHAGE) 500 MG tablet Take 2 tablets (1,000 mg total) by mouth in the morning and at bedtime.    . metoprolol tartrate (LOPRESSOR) 25 MG tablet Take 1 tablet (25 mg total) by mouth 2 (two) times daily. 60 tablet 1  . Multiple Vitamin (MULTIVITAMIN WITH MINERALS) TABS tablet Take 1 tablet by mouth daily. One A Day for Women    . nystatin cream (MYCOSTATIN) APPLY TO AFFECTED AREA TWICE A DAY 30 g 1  . omeprazole (PRILOSEC) 20 MG capsule TAKE 1 DAILY-ALTERNATES WITH FAMOTIDINE 90 capsule 1  . ONE TOUCH LANCETS MISC Use to check sugar twice daily Dx: E11.8 200 each 3  . ONETOUCH ULTRA test strip USE TO CHECK BLOOD SUGAR 2 TIMES   DAILY 200 strip 3  . OXYGEN Inhale 2 L into the lungs at bedtime.    . pioglitazone (ACTOS) 15 MG tablet TAKE 1 TABLET DAILY 90 tablet 0  . Rhubarb (ESTROVEN COMPLETE PO) Take 1 tablet by mouth daily.    . simvastatin (ZOCOR) 40 MG tablet TAKE 1 TABLET AT BEDTIME 90 tablet 3  . topiramate (TOPAMAX) 100 MG tablet Take 1 tablet (100 mg total) by mouth at bedtime. [place]  tablet 1  . umeclidinium-vilanterol (ANORO ELLIPTA) 62.5-25 MCG/INH AEPB Inhale 1 puff into the lungs daily. 60 each 5  . ziprasidone (GEODON) 40 MG capsule Take 1 capsule (40 mg total) by mouth in the morning and at bedtime.     No current facility-administered medications for this visit.    Physical Exam: BP 108/68 (BP Location: Left Arm, Patient Position: Sitting)   Pulse 84   Resp 20   Ht 5\' 2"  (1.575 m)   Wt 218 lb (98.9 kg)   SpO2 96% Comment: RA with mask on  BMI 39.87 kg/m  She looks well. Cardiac exam shows a regular rate and rhythm with normal heart sounds. Lung exam is clear. Chest incision is healing well and the sternum is stable. There is no peripheral edema.   Diagnostic Tests:  CLINICAL DATA:  Status  post coronary artery bypass grafting with intermittent shortness of breath  EXAM: CHEST - 2 VIEW  COMPARISON:  August 05, 2020  FINDINGS: There is airspace opacity in the left lower lobe region. Lungs elsewhere are clear. Heart size and pulmonary vascularity are normal. No adenopathy. Status post median sternotomy. There is aortic atherosclerosis. There is degenerative change in the thoracic spine.  IMPRESSION: Airspace opacity left lower lobe concerning for pneumonia in this area. Lungs elsewhere clear. Heart size normal. Postoperative changes.  Aortic Atherosclerosis (ICD10-I70.0).  These results will be called to the ordering clinician or representative by the Radiologist Assistant, and communication documented in the PACS or [employer].   Electronically Signed   By: Lowella Grip III M.D.   On: 09/01/2020 12:41   Impression:  She is doing fairly well following coronary artery bypass graft surgery.  Her main limitation is due to degenerative arthritis of her spine and knees.  I encouraged her to continue walking as much as possible.  I strongly encouraged her to attend cardiac rehab.  I asked her not to lift anything heavier than 10 pounds for 3 months postoperatively.  Her chest x-ray today was read by radiology as showing a possible opacity in the left lower lobe concerning for pneumonia.  She has no symptoms of pneumonia and her lungs are clear on exam.  I have personally examined her x-ray I do not see any clear-cut infiltrate.  Plan:  She will continue to follow-up with cardiology and her PCP.  I will see her back in 1 month for follow-up.   Gaye Pollack, MD [facility] [phone]

## 2020-09-01 NOTE — Progress Notes (Signed)
Virtual orientation call completed today. shehas an appointment on Date: 09/23/2020 for EP eval and gym Orientation.  Documentation of diagnosis can be found in [facility] Date: 08/03/2021.

## 2020-09-06 DIAGNOSIS — J449 Chronic obstructive pulmonary disease, unspecified: Secondary | ICD-10-CM | POA: Diagnosis not present

## 2020-09-11 ENCOUNTER — Encounter: Payer: Self-pay | Admitting: Family Medicine

## 2020-09-13 NOTE — Telephone Encounter (Signed)
Called pt and she stated that she is able to void but not as often as she used too and she had already scheduled an apt  for 09/14/2020 at 12:30pm . -JMA

## 2020-09-13 NOTE — Telephone Encounter (Signed)
If unable to void at all, needs in person eval today - would recommend [facility] or ER.

## 2020-09-13 NOTE — Telephone Encounter (Signed)
Spoke with pt to get details of sxs and to schedule OV.  Pt states on 09/11/20 she had pain when urinating and bright red blood in toilet.  Today denies any pain or blood but reports trouble urinating at all.  Says she cannot come do OV today but could come in tomorrow at 12:30.  Ok to add?

## 2020-09-14 ENCOUNTER — Other Ambulatory Visit: Payer: Self-pay

## 2020-09-14 ENCOUNTER — Ambulatory Visit (INDEPENDENT_AMBULATORY_CARE_PROVIDER_SITE_OTHER): Payer: Medicare HMO | Admitting: Family Medicine

## 2020-09-14 ENCOUNTER — Encounter: Payer: Self-pay | Admitting: Family Medicine

## 2020-09-14 VITALS — BP 138/76 | HR 98 | Temp 97.7°F | Ht 62.0 in | Wt 216.0 lb

## 2020-09-14 DIAGNOSIS — R634 Abnormal weight loss: Secondary | ICD-10-CM

## 2020-09-14 DIAGNOSIS — N3001 Acute cystitis with hematuria: Secondary | ICD-10-CM | POA: Diagnosis not present

## 2020-09-14 DIAGNOSIS — R0600 Dyspnea, unspecified: Secondary | ICD-10-CM

## 2020-09-14 DIAGNOSIS — I2510 Atherosclerotic heart disease of native coronary artery without angina pectoris: Secondary | ICD-10-CM

## 2020-09-14 DIAGNOSIS — R339 Retention of urine, unspecified: Secondary | ICD-10-CM | POA: Diagnosis not present

## 2020-09-14 DIAGNOSIS — I10 Essential (primary) hypertension: Secondary | ICD-10-CM | POA: Diagnosis not present

## 2020-09-14 DIAGNOSIS — Z951 Presence of aortocoronary bypass graft: Secondary | ICD-10-CM

## 2020-09-14 DIAGNOSIS — R0609 Other forms of dyspnea: Secondary | ICD-10-CM

## 2020-09-14 DIAGNOSIS — Z794 Long term (current) use of insulin: Secondary | ICD-10-CM

## 2020-09-14 DIAGNOSIS — E118 Type 2 diabetes mellitus with unspecified complications: Secondary | ICD-10-CM | POA: Diagnosis not present

## 2020-09-14 DIAGNOSIS — R310 Gross hematuria: Secondary | ICD-10-CM | POA: Insufficient documentation

## 2020-09-14 LAB — CBC WITH DIFFERENTIAL/PLATELET
Basophils Absolute: 0.1 10*3/uL (ref 0.0–0.1)
Basophils Relative: 0.4 % (ref 0.0–3.0)
Eosinophils Absolute: 0.1 10*3/uL (ref 0.0–0.7)
Eosinophils Relative: 0.6 % (ref 0.0–5.0)
HCT: 35.2 % — ABNORMAL LOW (ref 36.0–46.0)
Hemoglobin: 11.6 g/dL — ABNORMAL LOW (ref 12.0–15.0)
Lymphocytes Relative: 19.9 % (ref 12.0–46.0)
Lymphs Abs: 2.8 10*3/uL (ref 0.7–4.0)
MCHC: 32.9 g/dL (ref 30.0–36.0)
MCV: 87.6 fl (ref 78.0–100.0)
Monocytes Absolute: 0.9 10*3/uL (ref 0.1–1.0)
Monocytes Relative: 6.1 % (ref 3.0–12.0)
Neutro Abs: 10.4 10*3/uL — ABNORMAL HIGH (ref 1.4–7.7)
Neutrophils Relative %: 73 % (ref 43.0–77.0)
Platelets: 667 10*3/uL — ABNORMAL HIGH (ref 150.0–400.0)
RBC: 4.02 Mil/uL (ref 3.87–5.11)
RDW: 14.2 % (ref 11.5–15.5)
WBC: 14.2 10*3/uL — ABNORMAL HIGH (ref 4.0–10.5)

## 2020-09-14 LAB — POC URINALSYSI DIPSTICK (AUTOMATED)
Bilirubin, UA: NEGATIVE
Glucose, UA: NEGATIVE
Ketones, UA: NEGATIVE
Nitrite, UA: NEGATIVE
Protein, UA: NEGATIVE
Spec Grav, UA: 1.015 (ref 1.010–1.025)
Urobilinogen, UA: 0.2 E.U./dL
pH, UA: 5.5 (ref 5.0–8.0)

## 2020-09-14 LAB — COMPREHENSIVE METABOLIC PANEL
ALT: 9 U/L (ref 0–35)
AST: 13 U/L (ref 0–37)
Albumin: 4.1 g/dL (ref 3.5–5.2)
Alkaline Phosphatase: 65 U/L (ref 39–117)
BUN: 10 mg/dL (ref 6–23)
CO2: 26 mEq/L (ref 19–32)
Calcium: 10.2 mg/dL (ref 8.4–10.5)
Chloride: 92 mEq/L — ABNORMAL LOW (ref 96–112)
Creatinine, Ser: 0.85 mg/dL (ref 0.40–1.20)
GFR: 70.6 mL/min (ref >60.00)
Glucose, Bld: 69 mg/dL — ABNORMAL LOW (ref 70–99)
Potassium: 3.5 mEq/L (ref 3.5–5.1)
Sodium: 131 mEq/L — ABNORMAL LOW (ref 135–145)
Total Bilirubin: 0.3 mg/dL (ref 0.2–1.2)
Total Protein: 7.3 g/dL (ref 6.0–8.3)

## 2020-09-14 LAB — TSH: TSH: 1.37 u[IU]/mL (ref 0.35–4.50)

## 2020-09-14 MED ORDER — CEPHALEXIN 500 MG PO CAPS: 500.0000 mg | ORAL_CAPSULE | Freq: Two times a day (BID) | ORAL | 0 refills | Status: DC

## 2020-09-14 NOTE — Assessment & Plan Note (Addendum)
Weight loss noted. See below.

## 2020-09-14 NOTE — Assessment & Plan Note (Signed)
Latest A1c down to 5.9%. rec stop amaryl.

## 2020-09-14 NOTE — Progress Notes (Signed)
Patient ID: [name], female    DOB: [birth year], 70 y.o.   MRN: [id number]  This visit was conducted in person.  BP 138/76 (BP Location: Right Arm, Patient Position: Sitting, Cuff Size: Large)   Pulse 98   Temp 97.7 F (36.5 C) (Temporal)   Ht 5\' 2"  (1.575 m)   Wt 216 lb (98 kg)   SpO2 97%   BMI 39.51 kg/m    CC: urinary trouble  Subjective:   HPI: [name] is a 70 y.o. female presenting on 09/14/2020 for Urinary Retention (C/o pain while urinating then seeing bright, red blood in toilet after urinating.  Sxs started 09/11/20.  As of yesterday, sxs had resolved but pt had trouble urinating.  )   3d h/o dysuria, hematuria and abd pain. Yesterday symptoms resolved but she noted trouble with urinary retention. Denies increased frequency or urgency.  No fevers/chills, flank pain, vomiting. Staying nauseated.   No recent UTI No recent abx use.   Recent 1v CABG by Dr Cyndia Bent, has recovered well.   Weight loss noted (35 lbs) in the past 3 months. Attributes to no appetite since 2 wks prior to CABG. Trouble tolerating bread, spaghetti. Early satiety, nausea. No abd pain, vomiting, diarrhea/constipation. She tolerates chinese food ok.   EGD 05/2020 - no esophageal abnormality to explain dysphagia - esophagus dilated. Erosive gastropathy - neg H pylori  DM - sugars well controlled - brings log: fasting 90-120s, after dinner 90-120s. Continues amaryl, metformin actos, and novolin N 40u daily.  Lab Results  Component Value Date   HGBA1C 5.9 (H) 07/30/2020       Relevant past medical, surgical, family and social history reviewed and updated as indicated. Interim medical history since our last visit reviewed. Allergies and medications reviewed and updated. Outpatient Medications Prior to Visit  Medication Sig Dispense Refill  . albuterol (VENTOLIN HFA) 108 (90 Base) MCG/ACT inhaler Inhale 2 puffs into the lungs every 6 (six) hours as needed for wheezing or shortness of  breath. 8 g 3  . Apple Cider Vinegar 600 MG CAPS     . aspirin EC 325 MG EC tablet Take 1 tablet (325 mg total) by mouth daily.    . [place]wellia-Glucosamine-Vit D (OSTEO BI-FLEX ONE PER DAY PO) Take 1 tablet by mouth daily.    . butalbital-acetaminophen-caffeine (FIORICET) 50-325-40 MG tablet Take 1 tablet by mouth 2 (two) times daily as needed for headache or migraine. 20 tablet 1  . famotidine (PEPCID) 20 MG tablet Take 20 mg by mouth every other day. Alternates with omeprazole    . glimepiride (AMARYL) 1 MG tablet Take 1 tablet (1 mg total) by mouth daily with breakfast. 90 tablet 1  . INS SYRINGE/NEEDLE 1CC/28G (B-D INS SYR MICROFINE 1CC/28G) 28G X 1/2" 1 ML MISC USE AS DIRECTED 100 each 3  . insulin NPH Human (NOVOLIN N) 100 UNIT/ML injection Inject 0.4 mLs (40 Units total) into the skin daily after supper.    . levothyroxine (SYNTHROID) 100 MCG tablet TAKE 1 TABLET DAILY, AND ON2 DAYS A WEEK, TAKE AN     EXTRA 1/2 TABLET 90 tablet 3  . lisinopril (ZESTRIL) 2.5 MG tablet Take 1 tablet (2.5 mg total) by mouth daily. 90 tablet 3  . loratadine (CLARITIN) 10 MG tablet Take 1 tablet (10 mg total) by mouth daily.    . metFORMIN (GLUCOPHAGE) 500 MG tablet Take 2 tablets (1,000 mg total) by mouth in the morning and at bedtime.    Marland Kitchen  metoprolol tartrate (LOPRESSOR) 25 MG tablet Take 1 tablet (25 mg total) by mouth 2 (two) times daily. 60 tablet 1  . Multiple Vitamin (MULTIVITAMIN WITH MINERALS) TABS tablet Take 1 tablet by mouth daily. One A Day for Women    . nystatin cream (MYCOSTATIN) APPLY TO AFFECTED AREA TWICE A DAY 30 g 1  . omeprazole (PRILOSEC) 20 MG capsule TAKE 1 DAILY-ALTERNATES WITH FAMOTIDINE 90 capsule 1  . ONE TOUCH LANCETS MISC Use to check sugar twice daily Dx: E11.8 200 each 3  . ONETOUCH ULTRA test strip USE TO CHECK BLOOD SUGAR 2 TIMES   DAILY 200 strip 3  . OXYGEN Inhale 2 L into the lungs at bedtime.    . pioglitazone (ACTOS) 15 MG tablet TAKE 1 TABLET DAILY 90 tablet 0  .  Rhubarb (ESTROVEN COMPLETE PO) Take 1 tablet by mouth daily.    . simvastatin (ZOCOR) 40 MG tablet TAKE 1 TABLET AT BEDTIME 90 tablet 3  . topiramate (TOPAMAX) 100 MG tablet Take 1 tablet (100 mg total) by mouth at bedtime. 90 tablet 1  . umeclidinium-vilanterol (ANORO ELLIPTA) 62.5-25 MCG/INH AEPB Inhale 1 puff into the lungs daily. 60 each 5  . ziprasidone (GEODON) 40 MG capsule Take 1 capsule (40 mg total) by mouth in the morning and at bedtime.    . hydrochlorothiazide (HYDRODIURIL) 25 MG tablet TAKE 1 TABLET DAILY 90 tablet 1  . hydrochlorothiazide (HYDRODIURIL) 25 MG tablet Take 0.5 tablets (12.5 mg total) by mouth daily.    . magnesium oxide (MAG-OX) 400 (241.3 Mg) MG tablet Take 1 tablet (400 mg total) by mouth 2 (two) times daily. (Patient not taking: No sig reported) 60 tablet 0   No facility-administered medications prior to visit.     Per HPI unless specifically indicated in ROS section below Review of Systems Objective:  BP 138/76 (BP Location: Right Arm, Patient Position: Sitting, Cuff Size: Large)   Pulse 98   Temp 97.7 F (36.5 C) (Temporal)   Ht 5\' 2"  (1.575 m)   Wt 216 lb (98 kg)   SpO2 97%   BMI 39.51 kg/m   Wt Readings from Last 3 Encounters:  09/14/20 216 lb (98 kg)  09/01/20 218 lb (98.9 kg)  08/26/20 223 lb (101.2 kg)      Physical Exam Vitals and nursing note reviewed.  Constitutional:      Appearance: Normal appearance. She is obese. She is not ill-appearing.     Comments: Ambulates with walker Marked cervical kyphosis when upright  HENT:     Head: Normocephalic and atraumatic.  Eyes:     Extraocular Movements: Extraocular movements intact.     Conjunctiva/sclera: Conjunctivae normal.     Pupils: Pupils are equal, round, and reactive to light.  Neck:     Thyroid: No thyroid mass or thyromegaly.  Cardiovascular:     Rate and Rhythm: Normal rate and regular rhythm.     Pulses: Normal pulses.     Heart sounds: Normal heart sounds. No murmur  heard.   Pulmonary:     Effort: Pulmonary effort is normal. No respiratory distress.     Breath sounds: Normal breath sounds. No wheezing, rhonchi or rales.  Abdominal:     General: Bowel sounds are normal. There is no distension.     Palpations: Abdomen is soft. There is no mass.     Tenderness: There is no abdominal tenderness. There is no right CVA tenderness, left CVA tenderness, guarding or rebound.  Hernia: No hernia is present.  Musculoskeletal:     Right lower leg: No edema.     Left lower leg: No edema.  Skin:    General: Skin is warm and dry.     Coloration: Skin is not jaundiced.     Findings: No rash.  Neurological:     Mental Status: She is alert.  Psychiatric:        Mood and Affect: Mood normal.        Behavior: Behavior normal.       Results for orders placed or performed in visit on 09/14/20  POCT Urinalysis Dipstick (Automated)  Result Value Ref Range   Color, UA yellow    Clarity, UA cloudy    Glucose, UA Negative Negative   Bilirubin, UA negative    Ketones, UA negative    Spec Grav, UA 1.015 1.010 - 1.025   Blood, UA +/-    pH, UA 5.5 5.0 - 8.0   Protein, UA Negative Negative   Urobilinogen, UA 0.2 0.2 or 1.0 E.U./dL   Nitrite, UA negative    Leukocytes, UA Large (3+) (A) Negative   Lab Results  Component Value Date   TSH 2.79 05/21/2020   Assessment & Plan:  This visit occurred during the SARS-CoV-2 public health emergency.  Safety protocols were in place, including screening questions prior to the visit, additional usage of staff PPE, and extensive cleaning of exam room while observing appropriate contact time as indicated for disinfecting solutions.   Problem List Items Addressed This Visit    Weight loss, unintentional    Weight loss associated with anorexia, early satiety, ongoing nausea present since late 06/2020.  Check labs today. Discussed pending results possibly proceeding with abdominal imaging for further evaluation.        Relevant Orders   Comprehensive metabolic panel   CBC with Differential/Platelet   TSH   Severe obesity (BMI 35.0-39.9) with comorbidity ([place])    Weight loss noted. See below.       S/P CABG x 1   Hypertension    Some low readings recently to 100/50s. rec drop hctz to 12.5mg  daily. She will continue monitoring BP at home.       Relevant Medications   hydrochlorothiazide (HYDRODIURIL) 25 MG tablet   Dyspnea   Diabetes mellitus type 2, controlled, with complications ([facility])    Latest A1c down to 5.9%. rec stop amaryl.       CAD (coronary artery disease)    S/p 1v CABG 07/2020 ([place])      Relevant Medications   hydrochlorothiazide (HYDRODIURIL) 25 MG tablet   Acute cystitis with hematuria - Primary    UA/micro suspicious for UTI - treat with keflex 1 wk course. UCx sent.       Relevant Orders   Urine Culture    Other Visit Diagnoses    Urinary retention       Relevant Orders   POCT Urinalysis Dipstick (Automated) (Completed)       Meds ordered this encounter  Medications  . cephALEXin (KEFLEX) 500 MG capsule    Sig: Take 1 capsule (500 mg total) by mouth 2 (two) times daily.    Dispense:  14 capsule    Refill:  0   Orders Placed This Encounter  Procedures  . Urine Culture  . Comprehensive metabolic panel  . CBC with Differential/Platelet  . TSH  . POCT Urinalysis Dipstick (Automated)    Patient Instructions  Stop amaryl (glimepiride). Cut hydrochlorothiazide in  half to see if you feel any better.  Continue watching blood pressures and sugars.  Start antibiotic. Urine culture sent. We will be in touch with result.  Labs today as well. Pending results we may order abdominal imaging for further evaluation of nausea, weight loss, loss of appetite.    Follow up plan: Return if symptoms worsen or fail to improve.  Ria Bush, MD

## 2020-09-14 NOTE — Assessment & Plan Note (Signed)
S/p 1v CABG 07/2020 [facility])

## 2020-09-14 NOTE — Assessment & Plan Note (Addendum)
Weight loss associated with anorexia, early satiety, ongoing nausea present since late 06/2020.  Check labs today. Discussed pending results possibly proceeding with abdominal imaging for further evaluation.

## 2020-09-14 NOTE — Assessment & Plan Note (Signed)
Some low readings recently to 100/50s. rec drop hctz to 12.5mg  daily. She will continue monitoring BP at home.

## 2020-09-14 NOTE — Patient Instructions (Addendum)
Stop amaryl (glimepiride). Cut hydrochlorothiazide in half to see if you feel any better.  Continue watching blood pressures and sugars.  Start antibiotic. Urine culture sent. We will be in touch with result.  Labs today as well. Pending results we may order abdominal imaging for further evaluation of nausea, weight loss, loss of appetite.

## 2020-09-14 NOTE — Assessment & Plan Note (Signed)
UA/micro suspicious for UTI - treat with keflex 1 wk course. UCx sent.

## 2020-09-16 LAB — URINE CULTURE
MICRO NUMBER:: 11454353
SPECIMEN QUALITY:: ADEQUATE

## 2020-09-20 ENCOUNTER — Encounter: Payer: Self-pay | Admitting: Family Medicine

## 2020-09-20 DIAGNOSIS — R634 Abnormal weight loss: Secondary | ICD-10-CM

## 2020-09-20 DIAGNOSIS — R1084 Generalized abdominal pain: Secondary | ICD-10-CM

## 2020-09-23 ENCOUNTER — Ambulatory Visit: Payer: Medicare HMO

## 2020-09-23 NOTE — Addendum Note (Signed)
Addended by: Ria Bush on: 09/23/2020 01:00 PM   Modules accepted: Orders

## 2020-09-27 ENCOUNTER — Other Ambulatory Visit: Payer: Self-pay

## 2020-09-27 ENCOUNTER — Encounter: Payer: Self-pay | Admitting: Family Medicine

## 2020-09-27 ENCOUNTER — Ambulatory Visit (INDEPENDENT_AMBULATORY_CARE_PROVIDER_SITE_OTHER): Payer: Medicare HMO | Admitting: Family Medicine

## 2020-09-27 VITALS — BP 124/68 | HR 69 | Temp 97.6°F | Ht 62.0 in | Wt 216.5 lb

## 2020-09-27 DIAGNOSIS — M549 Dorsalgia, unspecified: Secondary | ICD-10-CM | POA: Diagnosis not present

## 2020-09-27 DIAGNOSIS — D75839 Thrombocytosis, unspecified: Secondary | ICD-10-CM | POA: Diagnosis not present

## 2020-09-27 DIAGNOSIS — R1084 Generalized abdominal pain: Secondary | ICD-10-CM | POA: Diagnosis not present

## 2020-09-27 DIAGNOSIS — R634 Abnormal weight loss: Secondary | ICD-10-CM

## 2020-09-27 DIAGNOSIS — E118 Type 2 diabetes mellitus with unspecified complications: Secondary | ICD-10-CM

## 2020-09-27 DIAGNOSIS — D729 Disorder of white blood cells, unspecified: Secondary | ICD-10-CM | POA: Diagnosis not present

## 2020-09-27 DIAGNOSIS — I10 Essential (primary) hypertension: Secondary | ICD-10-CM

## 2020-09-27 DIAGNOSIS — Z794 Long term (current) use of insulin: Secondary | ICD-10-CM | POA: Diagnosis not present

## 2020-09-27 DIAGNOSIS — R310 Gross hematuria: Secondary | ICD-10-CM

## 2020-09-27 LAB — BASIC METABOLIC PANEL
BUN: 9 mg/dL (ref 6–23)
CO2: 24 mEq/L (ref 19–32)
Calcium: 9.3 mg/dL (ref 8.4–10.5)
Chloride: 101 mEq/L (ref 96–112)
Creatinine, Ser: 0.72 mg/dL (ref 0.40–1.20)
GFR: 86.14 mL/min (ref >60.00)
Glucose, Bld: 66 mg/dL — ABNORMAL LOW (ref 70–99)
Potassium: 4.1 mEq/L (ref 3.5–5.1)
Sodium: 134 mEq/L — ABNORMAL LOW (ref 135–145)

## 2020-09-27 MED ORDER — ACETAMINOPHEN-CODEINE #3 300-30 MG PO TABS: 1.0000 | ORAL_TABLET | Freq: Three times a day (TID) | ORAL | 0 refills | Status: DC | PRN

## 2020-09-27 NOTE — Assessment & Plan Note (Signed)
?  discomfort around ventral hernia - abd/pelvic CT pending

## 2020-09-27 NOTE — Assessment & Plan Note (Addendum)
Weight stable today. Pending abd imaging.

## 2020-09-27 NOTE — Assessment & Plan Note (Signed)
Episode of gross hematuria several weeks ago. UCx negative for infection. Hematuria had resolved on UA checked last week. If recurrent, low threshold to refer to urology. Pending abd/pelvic CT.

## 2020-09-27 NOTE — Assessment & Plan Note (Signed)
Better control on lower hctz dose - continue. Update Na levels today with recent hyponatremia.

## 2020-09-27 NOTE — Progress Notes (Signed)
Patient ID: [name], female    DOB: 09-Feb-1951, 70 y.o.   MRN: [id number]  This visit was conducted in person.  BP 124/68 (BP Location: Right Arm, Patient Position: Sitting, Cuff Size: Large)   Pulse 69   Temp 97.6 F (36.4 C) (Temporal)   Ht 5\' 2"  (1.575 m)   Wt 216 lb 8 oz (98.2 kg)   SpO2 98%   BMI 39.60 kg/m    CC: 4 mo f/u visit  Subjective:   HPI: [name] is a 70 y.o. female presenting on 09/27/2020 for Follow-up (Here for 4 mo f/u.)   Husband currently in the hospital with sepsis.  She is in pain to entire back at this time - this pain comes on whenever she needs to use a wheelchair (ie to visit her husband in the hospital). Treating pain with tylenol arthritis strength. Denies inciting trauma/injury or falls.   See prior note for details. Seen here 2 wks ago with abd pain, dysuria, hematuria. UA/micro suspicious for infection so started on keflex however UCx returned negative.   CT scheduled for 10/07/2020 for weight loss noted over the past 3 months (pt attributes to no appetite and nausea since 06/2020).   HTN - last visit we dropped hctz to 12.5mg  daily. Tolerating this well. Home BP readings overall better - 120/50s. Improving dizziness.   DM - overall well controlled with weight loss - continued metformin, actos, novolin N 40u daily. Last visit we stopped amaryl. Tolerating this well. Continues low sugars to 80s in the mornings.  Lab Results  Component Value Date   HGBA1C 5.9 (H) 07/30/2020        Relevant past medical, surgical, family and social history reviewed and updated as indicated. Interim medical history since our last visit reviewed. Allergies and medications reviewed and updated. Outpatient Medications Prior to Visit  Medication Sig Dispense Refill  . albuterol (VENTOLIN HFA) 108 (90 Base) MCG/ACT inhaler Inhale 2 puffs into the lungs every 6 (six) hours as needed for wheezing or shortness of breath. 8 g 3  . Apple Cider Vinegar 600 MG  CAPS     . aspirin EC 325 MG EC tablet Take 1 tablet (325 mg total) by mouth daily.    . Boswellia-Glucosamine-Vit D (OSTEO BI-FLEX ONE PER DAY PO) Take 1 tablet by mouth daily.    . butalbital-acetaminophen-caffeine (FIORICET) 50-325-40 MG tablet Take 1 tablet by mouth 2 (two) times daily as needed for headache or migraine. 20 tablet 1  . famotidine (PEPCID) 20 MG tablet Take 20 mg by mouth every other day. Alternates with omeprazole    . hydrochlorothiazide (HYDRODIURIL) 25 MG tablet Take 0.5 tablets (12.5 mg total) by mouth daily.    . INS SYRINGE/NEEDLE 1CC/28G (B-D INS SYR MICROFINE 1CC/28G) 28G X 1/2" 1 ML MISC USE AS DIRECTED 100 each 3  . insulin NPH Human (NOVOLIN N) 100 UNIT/ML injection Inject 0.4 mLs (40 Units total) into the skin daily after supper.    . levothyroxine (SYNTHROID) 100 MCG tablet TAKE 1 TABLET DAILY, AND ON2 DAYS A WEEK, TAKE AN     EXTRA 1/2 TABLET 90 tablet 3  . lisinopril (ZESTRIL) 2.5 MG tablet Take 1 tablet (2.5 mg total) by mouth daily. 90 tablet 3  . loratadine (CLARITIN) 10 MG tablet Take 1 tablet (10 mg total) by mouth daily.    . metFORMIN (GLUCOPHAGE) 500 MG tablet Take 2 tablets (1,000 mg total) by mouth in the morning and at  bedtime.    . metoprolol tartrate (LOPRESSOR) 25 MG tablet Take 1 tablet (25 mg total) by mouth 2 (two) times daily. 60 tablet 1  . Multiple Vitamin (MULTIVITAMIN WITH MINERALS) TABS tablet Take 1 tablet by mouth daily. One A Day for Women    . nystatin cream (MYCOSTATIN) APPLY TO AFFECTED AREA TWICE A DAY 30 g 1  . omeprazole (PRILOSEC) 20 MG capsule TAKE 1 DAILY-ALTERNATES WITH FAMOTIDINE 90 capsule 1  . ONE TOUCH LANCETS MISC Use to check sugar twice daily Dx: E11.8 200 each 3  . ONETOUCH ULTRA test strip USE TO CHECK BLOOD SUGAR 2 TIMES   DAILY 200 strip 3  . OXYGEN Inhale 2 L into the lungs at bedtime.    . pioglitazone (ACTOS) 15 MG tablet TAKE 1 TABLET DAILY 90 tablet 0  . Rhubarb (ESTROVEN COMPLETE PO) Take 1 tablet by mouth  daily.    . simvastatin (ZOCOR) 40 MG tablet TAKE 1 TABLET AT BEDTIME 90 tablet 3  . topiramate (TOPAMAX) 100 MG tablet Take 1 tablet (100 mg total) by mouth at bedtime. 90 tablet 1  . umeclidinium-vilanterol (ANORO ELLIPTA) 62.5-25 MCG/INH AEPB Inhale 1 puff into the lungs daily. 60 each 5  . ziprasidone (GEODON) 40 MG capsule Take 1 capsule (40 mg total) by mouth in the morning and at bedtime.    . cephALEXin (KEFLEX) 500 MG capsule Take 1 capsule (500 mg total) by mouth 2 (two) times daily. 14 capsule 0  . glimepiride (AMARYL) 1 MG tablet Take 1 tablet (1 mg total) by mouth daily with breakfast. 90 tablet 1  . magnesium oxide (MAG-OX) 400 (241.3 Mg) MG tablet Take 1 tablet (400 mg total) by mouth 2 (two) times daily. (Patient not taking: No sig reported) 60 tablet 0   No facility-administered medications prior to visit.     Per HPI unless specifically indicated in ROS section below Review of Systems Objective:  BP 124/68 (BP Location: Right Arm, Patient Position: Sitting, Cuff Size: Large)   Pulse 69   Temp 97.6 F (36.4 C) (Temporal)   Ht 5\' 2"  (1.575 m)   Wt 216 lb 8 oz (98.2 kg)   SpO2 98%   BMI 39.60 kg/m   Wt Readings from Last 3 Encounters:  09/27/20 216 lb 8 oz (98.2 kg)  09/14/20 216 lb (98 kg)  09/01/20 218 lb (98.9 kg)      Physical Exam Vitals and nursing note reviewed.  Constitutional:      Appearance: Normal appearance.     Comments: Ambulates with rolling walker  Cardiovascular:     Rate and Rhythm: Normal rate and regular rhythm.     Pulses: Normal pulses.     Heart sounds: Normal heart sounds. No murmur heard.   Pulmonary:     Effort: Pulmonary effort is normal. No respiratory distress.     Breath sounds: Normal breath sounds. No wheezing, rhonchi or rales.  Abdominal:     General: Bowel sounds are normal. There is no distension.     Palpations: There is no mass.     Tenderness: There is no abdominal tenderness. There is no guarding or rebound.      Hernia: A hernia is present. Hernia is present in the ventral area (nontender). There is no hernia in the umbilical area.  Musculoskeletal:     Comments: Mild discomfort midline diffusely on exam from mid thoracic vertebrae to mid lumbar vertebrae  Skin:    General: Skin is warm and dry.  Findings: No rash.  Neurological:     Mental Status: She is alert.  Psychiatric:        Mood and Affect: Mood normal.        Behavior: Behavior normal.       Results for orders placed or performed in visit on 09/14/20  Urine Culture   Specimen: Urine  Result Value Ref Range   MICRO NUMBER: [id number]    SPECIMEN QUALITY: Adequate    Sample Source URINE    STATUS: FINAL    ISOLATE 1:      Mixed genital flora isolated. These superficial bacteria are not indicative of a urinary tract infection. No further organism identification is warranted on this specimen. If clinically indicated, recollect clean-catch, mid-stream urine and transfer  immediately to Urine Culture Transport Tube.   Comprehensive metabolic panel  Result Value Ref Range   Sodium 131 (L) 135 - 145 mEq/L   Potassium 3.5 3.5 - 5.1 mEq/L   Chloride 92 (L) 96 - 112 mEq/L   CO2 26 19 - 32 mEq/L   Glucose, Bld 69 (L) 70 - 99 mg/dL   BUN 10 6 - 23 mg/dL   Creatinine, Ser 0.85 0.40 - 1.20 mg/dL   Total Bilirubin 0.3 0.2 - 1.2 mg/dL   Alkaline Phosphatase 65 39 - 117 U/L   AST 13 0 - 37 U/L   ALT 9 0 - 35 U/L   Total Protein 7.3 6.0 - 8.3 g/dL   Albumin 4.1 3.5 - 5.2 g/dL   GFR 70.60 >60.00 mL/min   Calcium 10.2 8.4 - 10.5 mg/dL  CBC with Differential/Platelet  Result Value Ref Range   WBC 14.2 (H) 4.0 - 10.5 K/uL   RBC 4.02 3.87 - 5.11 Mil/uL   Hemoglobin 11.6 (L) 12.0 - 15.0 g/dL   HCT 35.2 (L) 36.0 - 46.0 %   MCV 87.6 78.0 - 100.0 fl   MCHC 32.9 30.0 - 36.0 g/dL   RDW 14.2 11.5 - 15.5 %   Platelets 667.0 (H) 150.0 - 400.0 K/uL   Neutrophils Relative % 73.0 43.0 - 77.0 %   Lymphocytes Relative 19.9 12.0 - 46.0 %    Monocytes Relative 6.1 3.0 - 12.0 %   Eosinophils Relative 0.6 0.0 - 5.0 %   Basophils Relative 0.4 0.0 - 3.0 %   Neutro Abs 10.4 (H) 1.4 - 7.7 K/uL   Lymphs Abs 2.8 0.7 - 4.0 K/uL   Monocytes Absolute 0.9 0.1 - 1.0 K/uL   Eosinophils Absolute 0.1 0.0 - 0.7 K/uL   Basophils Absolute 0.1 0.0 - 0.1 K/uL  TSH  Result Value Ref Range   TSH 1.37 0.35 - 4.50 uIU/mL  POCT Urinalysis Dipstick (Automated)  Result Value Ref Range   Color, UA yellow    Clarity, UA cloudy    Glucose, UA Negative Negative   Bilirubin, UA negative    Ketones, UA negative    Spec Grav, UA 1.015 1.010 - 1.025   Blood, UA +/-    pH, UA 5.5 5.0 - 8.0   Protein, UA Negative Negative   Urobilinogen, UA 0.2 0.2 or 1.0 E.U./dL   Nitrite, UA negative    Leukocytes, UA Large (3+) (A) Negative   Assessment & Plan:  This visit occurred during the SARS-CoV-2 public health emergency.  Safety protocols were in place, including screening questions prior to the visit, additional usage of staff PPE, and extensive cleaning of exam room while observing appropriate contact time as indicated for disinfecting solutions.  Problem List Items Addressed This Visit    Weight loss, unintentional    Weight stable today. Pending abd imaging.       Thrombocytosis    Update CBC and periph smear.       Relevant Orders   Basic metabolic panel   Pathologist smear review   CBC with Differential/Platelet   Neutrophilia    Chronic. Update periph smear. Pending CT abd/pelvis for episodes of abdominal pain associated with early satiety and weight loss (albeit stable recently).       Hypertension    Better control on lower hctz dose - continue. Update Na levels today with recent hyponatremia.       Gross hematuria    Episode of gross hematuria several weeks ago. UCx negative for infection. Hematuria had resolved on UA checked last week. If recurrent, low threshold to refer to urology. Pending abd/pelvic CT.       Diabetes mellitus  type 2, controlled, with complications ([place]) - Primary    Chronic, stable period off amaryl without further hypoglycemia. Continue current regimen (metformin, actos, and novolin N)      Back pain    Midline thoracic to lumbar spine tenderness, acute flare from wheelchair use.  Not consistent with vertebral fracture, no inciting injury.  Rx tylenol #3 for breakthrough pain temporarily.      Relevant Medications   acetaminophen-codeine (TYLENOL #3) 300-30 MG tablet   Abdominal pain    ?discomfort around ventral hernia - abd/pelvic CT pending          Meds ordered this encounter  Medications  . acetaminophen-codeine (TYLENOL #3) 300-30 MG tablet    Sig: Take 1 tablet by mouth every 8 (eight) hours as needed for moderate pain.    Dispense:  15 tablet    Refill:  0   Orders Placed This Encounter  Procedures  . Basic metabolic panel  . Pathologist smear review  . CBC with Differential/Platelet    Patient Instructions  Labs today  Return as needed or in 3 months for follow up visit.  May take tylenol #3 for breakthrough back pain.   Follow up plan: No follow-ups on file.  Ria Bush, MD

## 2020-09-27 NOTE — Assessment & Plan Note (Signed)
Update CBC and periph smear.

## 2020-09-27 NOTE — Assessment & Plan Note (Addendum)
Midline thoracic to lumbar spine tenderness, acute flare from wheelchair use.  Not consistent with vertebral fracture, no inciting injury.  Rx tylenol #3 for breakthrough pain temporarily.

## 2020-09-27 NOTE — Assessment & Plan Note (Signed)
Chronic, stable period off amaryl without further hypoglycemia. Continue current regimen (metformin, actos, and novolin N)

## 2020-09-27 NOTE — Patient Instructions (Addendum)
Labs today  Return as needed or in 3 months for follow up visit.  May take tylenol #3 for breakthrough back pain.

## 2020-09-27 NOTE — Assessment & Plan Note (Signed)
Chronic. Update periph smear. Pending CT abd/pelvis for episodes of abdominal pain associated with early satiety and weight loss (albeit stable recently).

## 2020-09-28 LAB — CBC WITH DIFFERENTIAL/PLATELET
Absolute Monocytes: 683 cells/uL (ref 200–950)
Basophils Absolute: 61 cells/uL (ref 0–200)
Basophils Relative: 0.5 %
Eosinophils Absolute: 159 cells/uL (ref 15–500)
Eosinophils Relative: 1.3 %
HCT: 33.3 % — ABNORMAL LOW (ref 35.0–45.0)
Hemoglobin: 11 g/dL — ABNORMAL LOW (ref 11.7–15.5)
Lymphs Abs: 2696 cells/uL (ref 850–3900)
MCH: 29.3 pg (ref 27.0–33.0)
MCHC: 33 g/dL (ref 32.0–36.0)
MCV: 88.8 fL (ref 80.0–100.0)
MPV: 9.5 fL (ref 7.5–12.5)
Monocytes Relative: 5.6 %
Neutro Abs: 8601 cells/uL — ABNORMAL HIGH (ref 1500–7800)
Neutrophils Relative %: 70.5 %
Platelets: 499 10*3/uL — ABNORMAL HIGH (ref 140–400)
RBC: 3.75 10*6/uL — ABNORMAL LOW (ref 3.80–5.10)
RDW: 13.4 % (ref 11.0–15.0)
Total Lymphocyte: 22.1 %
WBC: 12.2 10*3/uL — ABNORMAL HIGH (ref 3.8–10.8)

## 2020-09-28 LAB — PATHOLOGIST SMEAR REVIEW

## 2020-09-29 ENCOUNTER — Telehealth: Payer: Self-pay | Admitting: Family Medicine

## 2020-09-29 MED ORDER — INSULIN NPH (HUMAN) (ISOPHANE) 100 UNIT/ML ~~LOC~~ SUSP: 40.0000 [IU] | Freq: Every day | SUBCUTANEOUS | 0 refills | Status: DC

## 2020-09-29 MED ORDER — INSULIN NPH (HUMAN) (ISOPHANE) 100 UNIT/ML ~~LOC~~ SUSP: 40.0000 [IU] | Freq: Every day | SUBCUTANEOUS | 3 refills | Status: DC

## 2020-09-29 NOTE — Telephone Encounter (Signed)
She ordered her medication 2 weeks ago and she received her needles but never got the medication, and they told she was not covered and then they told her it was a mistake, and she needs to get the novlin- n and its covered , they ordered the humalog-n but that's not covered only novalin-n is and they to do a emergancy override to get the novalin- n at the [facility]

## 2020-09-29 NOTE — Telephone Encounter (Signed)
Spoke with pt to get clarification of message.  States [facility] told her humalog N was ordered instead of Novolin N and they were telling her the humalog N is not covered.  However, now everything is straightened out but because of confusion, pt is out of meds and needs a 30-day rx sent to [facility] and 90-day rx sent to [place].   E-scribed rxs to both pharmacies as above.  Pt aware.

## 2020-09-30 ENCOUNTER — Ambulatory Visit: Payer: Medicare HMO

## 2020-09-30 ENCOUNTER — Encounter: Payer: Self-pay | Admitting: Family Medicine

## 2020-10-03 ENCOUNTER — Encounter: Payer: Self-pay | Admitting: Family Medicine

## 2020-10-04 ENCOUNTER — Encounter: Payer: Self-pay | Admitting: Family Medicine

## 2020-10-06 ENCOUNTER — Other Ambulatory Visit: Payer: Self-pay | Admitting: Physician Assistant

## 2020-10-06 ENCOUNTER — Encounter: Payer: Self-pay | Admitting: Surgery

## 2020-10-06 ENCOUNTER — Telehealth: Payer: Self-pay | Admitting: *Deleted

## 2020-10-06 MED ORDER — TRAMADOL HCL 50 MG PO TABS: 50.0000 mg | ORAL_TABLET | Freq: Three times a day (TID) | ORAL | 0 refills | Status: AC | PRN

## 2020-10-06 MED ORDER — METOPROLOL TARTRATE 25 MG PO TABS: 12.5000 mg | ORAL_TABLET | Freq: Two times a day (BID) | ORAL | 6 refills | Status: DC

## 2020-10-06 NOTE — Telephone Encounter (Signed)
Called and spoke with patient.  Fasting cbg's 70s, 50s-80s after dinner.  Blood pressures have also been dropping - 96/35 this morning, 138/53 after potato chips. 118/42 recently.   rec stop HCTZ completely Drop metoprolol to 12.5mg  BID.  Continue lisinopril 2.5mg  daily.  Recommend drop insulin novolin N to 20u nightly.  She already dropped metformin to 1tab bid - ok to continue this.   Call us or mychart [place] with results of above changes.

## 2020-10-06 NOTE — Telephone Encounter (Signed)
Called and spoke with patient - ok to try tramadol in place of tylenol#3

## 2020-10-06 NOTE — Telephone Encounter (Signed)
[name] contacted the office regarding low blood sugars and BP. Pt had an appt with Dr. Cyndia Bent today that she had to cancel. Per pt, her sugars have been trending down as well as her BP, this mornings BP 96/35 repeat 112/43. Pt states she was eating potato chips to help elevate her BP when it was low. Advised pt to contact her PCP regarding her sugars and her cardiologist regarding her BP. Pt verbalized understanding.

## 2020-10-07 ENCOUNTER — Ambulatory Visit
Admission: RE | Admit: 2020-10-07 | Discharge: 2020-10-07 | Disposition: A | Discharge status: Home / Self Care | Payer: Medicare HMO | Source: Ambulatory Visit | Attending: Family Medicine | Admitting: Family Medicine

## 2020-10-07 ENCOUNTER — Other Ambulatory Visit: Payer: Self-pay

## 2020-10-07 DIAGNOSIS — R1084 Generalized abdominal pain: Secondary | ICD-10-CM

## 2020-10-07 DIAGNOSIS — R634 Abnormal weight loss: Secondary | ICD-10-CM | POA: Diagnosis not present

## 2020-10-07 DIAGNOSIS — J449 Chronic obstructive pulmonary disease, unspecified: Secondary | ICD-10-CM | POA: Diagnosis not present

## 2020-10-07 DIAGNOSIS — R109 Unspecified abdominal pain: Secondary | ICD-10-CM | POA: Diagnosis not present

## 2020-10-07 MED ORDER — IOHEXOL 300 MG/ML  SOLN
100.0000 mL | Freq: Once | INTRAMUSCULAR | Status: AC | PRN
  Administered 2020-10-07: 100 mL via INTRAVENOUS

## 2020-10-08 ENCOUNTER — Ambulatory Visit: Payer: Medicare HMO | Admitting: Cardiology

## 2020-10-08 ENCOUNTER — Encounter: Payer: Self-pay | Admitting: Cardiology

## 2020-10-08 ENCOUNTER — Encounter: Payer: Self-pay | Admitting: Family Medicine

## 2020-10-08 VITALS — BP 100/46 | HR 77 | Ht 62.0 in | Wt 218.0 lb

## 2020-10-08 DIAGNOSIS — E785 Hyperlipidemia, unspecified: Secondary | ICD-10-CM | POA: Diagnosis not present

## 2020-10-08 DIAGNOSIS — I10 Essential (primary) hypertension: Secondary | ICD-10-CM

## 2020-10-08 DIAGNOSIS — I2510 Atherosclerotic heart disease of native coronary artery without angina pectoris: Secondary | ICD-10-CM | POA: Diagnosis not present

## 2020-10-08 MED ORDER — ASPIRIN 81 MG PO TBEC: 81.0000 mg | DELAYED_RELEASE_TABLET | Freq: Every day | ORAL | Status: AC

## 2020-10-08 NOTE — Progress Notes (Signed)
Cardiology Office Note:    Date:  10/08/2020   ID:  [name], DOB Feb 02, 1951, MRN [id number]  PCP:  Ria Bush, MD  [facility] Cardiologist:  Kate Sable, MD  [place] Electrophysiologist:  None   Referring MD: Ria Bush, MD   Chief Complaint  Patient presents with  . Other    6 week follow up - Patient c/o SOB all the time and Palpitations. Patient states her BP has been in the 100s / 3s. Meds reviewed verbally with patient.     History of Present Illness:    [name] is a 70 y.o. female with a hx of CAD/CABG x 1 (07/2020), hypertension, hyperlipidemia, diabetes, former smoker x40 years, COPD, OSA who presents for follow-up.    Patient last seen due to shortness of breath and abnormal echo.  Lexiscan Myoview was abnormal.  Underwent left heart cath showing significant ostial to mid LAD stenosis status post CABG x1.  Patient states having low blood pressures over the last several weeks at home.  Systolic is usually in the 100s.  HCTZ was previously stopped.  Lisinopril was decreased.  Currently does not take metoprolol even though it is listed on her list of medications.  Denies chest pain, states feeling fatigued.  Prior notes Intraoperative TEE, EF approximately 50%. Right and left heart cath 06/2020 single-vessel CAD sequential 70% ostial, 90% mid LAD.   Past Medical History:  Diagnosis Date  . Anxiety   . Arthritis    per patient "all over body"  . Bipolar disorder ([place])    has stopped seeing psychiatrist  . CAD (coronary artery disease)    a. 06/2020 MV: small, sev, apical inf/apical defect-scar vs ischemia. EF 45%; b. 06/2020 Cath: LM 71m, LAD 70ost, 81m, LCX nl, RCA nl; c. 07/2020 CABG x 1: LIMA->LAD.  Marland Kitchen Cataract   . Chronic bronchitis   . COPD (chronic obstructive pulmonary disease) ([place])   . Coronary artery disease, non-occlusive   . Depression   . Dyspnea 2012   s/p pulm/cards w/u WNL, thought due to  obesity/deconditioning  . Fibromyalgia   . GERD (gastroesophageal reflux disease)   . HFmrEF (heart failure with mid-range ejection fraction) ([place])    a. 05/2020 Echo: EF 40%, glob HK, mild LVH, Gr1 DD; b. 06/2020 EF by SPECT: 45%; c. 07/2020 intraop TEE: EF 50%.  . History of chicken pox   . HLD (hyperlipidemia)   . Hypertension   . Hypothyroidism   . Ischemic cardiomyopathy    a. 05/2020 Echo: EF 40%; b. 06/2020 EF by SPECT: 45%; c. 07/2020 intraop TEE: EF 50%.  . Migraine   . OSA (obstructive sleep apnea) 03/16/2011  . Pneumonia   . Sleep apnea    per patient cannot tolerate CPAP  . T2DM (type 2 diabetes mellitus) ([place]) 2012   DM education 06/2011  . Urinary incontinence     Past Surgical History:  Procedure Laterality Date  . BREAST CYST ASPIRATION Right 2003  . cardiac catherization  03/2011   x3 [facility]), mild nonobstructive CAD  . COLONOSCOPY  12/2011   hyperplastic polyp x2,. diverticulosis, rec rpt 10 yrs  . CORONARY ARTERY BYPASS GRAFT N/A 08/03/2020   Procedure: CORONARY ARTERY BYPASS GRAFTING TIMES ONE USING LEFT INTERNAL MAMMARY ARTERY.;  Surgeon: Gaye Pollack, MD;  Location: [place] OR;  Service: Open Heart Surgery;  Laterality: N/A;  . ESOPHAGOGASTRODUODENOSCOPY  2006  . ESOPHAGOGASTRODUODENOSCOPY  05/2020   no esophageal abnormality to explain dysphagia - esophagus  dilated. Erosive gastropathy - neg H pylori [facility])  . KNEE SURGERY  2006   left, torn meniscus  . RIGHT/LEFT HEART CATH AND CORONARY ANGIOGRAPHY N/A 07/13/2020   Procedure: RIGHT/LEFT HEART CATH AND CORONARY ANGIOGRAPHY;  Surgeon: Nelva Bush, MD;  Location: [place] [facility];  Service: Cardiovascular;  Laterality: N/A;  . TEE WITHOUT CARDIOVERSION N/A 08/03/2020   Procedure: TRANSESOPHAGEAL ECHOCARDIOGRAM (TEE);  Surgeon: Gaye Pollack, MD;  Location: [place];  Service: Open Heart Surgery;  Laterality: N/A;  . TOTAL ABDOMINAL HYSTERECTOMY  2004   fibroids, cervix remained  . TOTAL KNEE  ARTHROPLASTY Left 08/2012   [name], [place] [facility]    Current Medications: Current Meds  Medication Sig  . albuterol (VENTOLIN HFA) 108 (90 Base) MCG/ACT inhaler Inhale 2 puffs into the lungs every 6 (six) hours as needed for wheezing or shortness of breath.  . Apple Cider Vinegar 600 MG CAPS   . [place]-Glucosamine-Vit D (OSTEO BI-FLEX ONE PER DAY PO) Take 1 tablet by mouth daily.  . butalbital-acetaminophen-caffeine (FIORICET) 50-325-40 MG tablet Take 1 tablet by mouth 2 (two) times daily as needed for headache or migraine.  . famotidine (PEPCID) 20 MG tablet Take 20 mg by mouth every other day. Alternates with omeprazole  . INS SYRINGE/NEEDLE 1CC/28G (B-D INS SYR MICROFINE 1CC/28G) 28G X 1/2" 1 ML MISC USE AS DIRECTED  . insulin NPH Human (NOVOLIN N) 100 UNIT/ML injection Inject 0.2 mLs (20 Units total) into the skin daily after supper.  . levothyroxine (SYNTHROID) 100 MCG tablet TAKE 1 TABLET DAILY, AND ON2 DAYS A WEEK, TAKE AN     EXTRA 1/2 TABLET  . loratadine (CLARITIN) 10 MG tablet Take 1 tablet (10 mg total) by mouth daily.  . metFORMIN (GLUCOPHAGE) 500 MG tablet Take 500 mg by mouth 2 (two) times daily with a meal.  . Multiple Vitamin (MULTIVITAMIN WITH MINERALS) TABS tablet Take 1 tablet by mouth daily. One A Day for Women  . nystatin cream (MYCOSTATIN) APPLY TO AFFECTED AREA TWICE A DAY  . omeprazole (PRILOSEC) 20 MG capsule TAKE 1 DAILY-ALTERNATES WITH FAMOTIDINE  . ONE TOUCH LANCETS MISC Use to check sugar twice daily Dx: E11.8  . ONETOUCH ULTRA test strip USE TO CHECK BLOOD SUGAR 2 TIMES   DAILY  . OXYGEN Inhale 2 L into the lungs at bedtime.  . pioglitazone (ACTOS) 15 MG tablet TAKE 1 TABLET DAILY  . Rhubarb (ESTROVEN COMPLETE PO) Take 1 tablet by mouth daily.  . simvastatin (ZOCOR) 40 MG tablet TAKE 1 TABLET AT BEDTIME  . topiramate (TOPAMAX) 100 MG tablet Take 1 tablet (100 mg total) by mouth at bedtime.  . traMADol (ULTRAM) 50 MG tablet Take 1 tablet (50 mg total) by  mouth every 8 (eight) hours as needed for up to 5 days.  Marland Kitchen umeclidinium-vilanterol (ANORO ELLIPTA) 62.5-25 MCG/INH AEPB Inhale 1 puff into the lungs daily.  . ziprasidone (GEODON) 40 MG capsule Take 1 capsule (40 mg total) by mouth in the morning and at bedtime.  . [DISCONTINUED] aspirin EC 325 MG EC tablet Take 1 tablet (325 mg total) by mouth daily.  . [DISCONTINUED] lisinopril (ZESTRIL) 2.5 MG tablet Take 1 tablet (2.5 mg total) by mouth daily.  . [DISCONTINUED] metoprolol tartrate (LOPRESSOR) 25 MG tablet Take 0.5 tablets (12.5 mg total) by mouth 2 (two) times daily.     Allergies:   Crestor [rosuvastatin], Lipitor [atorvastatin], Lithium, and Quetiapine   Social History   Socioeconomic History  . Marital status: Married  Spouse name: Not on file  . Number of children: Not on file  . Years of education: Not on file  . Highest education level: Not on file  Occupational History  . Occupation: disabled    Employer: OTHER  Tobacco Use  . Smoking status: Former Smoker    Packs/day: 2.00    Years: 43.00    Pack years: 86.00    Types: Cigarettes    Quit date: 05/17/2010    Years since quitting: 10.4  . Smokeless tobacco: Never Used  . Tobacco comment: QUIT 2011  Vaping Use  . Vaping Use: Never used  Substance and Sexual Activity  . Alcohol use: No    Alcohol/week: 0.0 standard drinks  . Drug use: No  . Sexual activity: Yes    Partners: Male  Other Topics Concern  . Not on file  Social History Narrative   Caffeine: 2-3 coffee/am, 1 cup tea in afternoon; Lives with husband, 1 dog, 2 cats; Occupation: disability since 2009 for bipolar, previously worked at [employer]; Diet: not many fruits, good vegetables, good amt water.      Smoker 43 years; quit in 2011. No alcohol. Lives in [place]; with husband.    Social Determinants of Health   Financial Resource Strain: Not on file  Food Insecurity: Not on file  Transportation Needs: Not on file  Physical Activity: Not on file   Stress: Not on file  Social Connections: Not on file     Family History: The patient's family history includes Acute lymphoblastic leukemia (age of onset: 66) in her cousin; Aneurysm in her maternal uncle; Arthritis in her mother; Asthma in her maternal uncle; Breast cancer in her maternal aunt; COPD in her sister; Cancer in her maternal uncle and paternal uncle; Cancer (age of onset: 56) in her father; Cancer (age of onset: 23) in her maternal aunt; Colon cancer (age of onset: 77) in her maternal uncle; Diabetes in her father; Hypertension in her mother; Stroke in her mother; Thyroid disease in her father. There is no history of Coronary artery disease, Stomach cancer, Rectal cancer, or Esophageal cancer.  ROS:   Please see the history of present illness.     All other systems reviewed and are negative.  EKGs/Labs/Other Studies Reviewed:    The following studies were reviewed today:   EKG:  EKG is  ordered today.  The ekg ordered today demonstrates sinus rhythm  Recent Labs: 08/04/2020: Magnesium 1.8 09/14/2020: ALT 9; TSH 1.37 09/27/2020: BUN 9; Creatinine, Ser 0.72; Hemoglobin 11.0; Platelets 499; Potassium 4.1; Sodium 134  Recent Lipid Panel    Component Value Date/Time   CHOL 131 05/21/2020 0915   TRIG 239.0 (H) 05/21/2020 0915   HDL 42.80 05/21/2020 0915   CHOLHDL 3 05/21/2020 0915   VLDL 47.8 (H) 05/21/2020 0915   LDLCALC 48 12/08/2013 0824   LDLDIRECT 65.0 05/21/2020 0915     Risk Assessment/Calculations:      Physical Exam:    VS:  BP (!) 100/46 (BP Location: Right Arm, Patient Position: Sitting, Cuff Size: Large)   Pulse 77   Ht 5\' 2"  (1.575 m)   Wt 218 lb (98.9 kg)   BMI 39.87 kg/m     Wt Readings from Last 3 Encounters:  10/08/20 218 lb (98.9 kg)  09/27/20 216 lb 8 oz (98.2 kg)  09/14/20 216 lb (98 kg)     GEN:  Well nourished, well developed in no acute distress HEENT: Normal NECK: No JVD; No carotid bruits LYMPHATICS: No  lymphadenopathy CARDIAC:  RRR, no murmurs, rubs, gallops RESPIRATORY:  Clear to auscultation, decreased breath sounds at bases ABDOMEN: Soft, non-tender, non-distended MUSCULOSKELETAL:  No edema; No deformity  SKIN: Warm and dry NEUROLOGIC:  Alert and oriented x 3 PSYCHIATRIC:  Normal affect   ASSESSMENT:    1. Coronary artery disease involving native coronary artery of native heart without angina pectoris   2. Primary hypertension   3. Hyperlipidemia LDL goal <70   4. Obesity, morbid, BMI 40.0-49.9 ([place])    PLAN:    In order of problems listed above:  1. CAD/CABG x1 on 07/2020.  Tolerating aspirin, Lipitor.  Intraoperative TEE with preserved ejection fraction of about 50%.  Decrease aspirin to 81 mg daily. 2. hypertension, BP low.  Stop lisinopril, stop Lopressor. 3. hyperlipidemia, continue statin.  LDL at goal 4. morbidly obese, continue healthy eating diet, weight loss.  Follow-up in 3 months.   Medication Adjustments/Labs and Tests Ordered: Current medicines are reviewed at length with the patient today.  Concerns regarding medicines are outlined above.  Orders Placed This Encounter  Procedures  . EKG 12-Lead   Meds ordered this encounter  Medications  . aspirin 81 MG EC tablet    Sig: Take 1 tablet (81 mg total) by mouth daily.    Patient Instructions  Medication Instructions:  Your physician has recommended you make the following change in your medication:   STOP Lisinopril  STOP Metoprolol  REDUCE Aspirin to 81 mg daily.   *If you need a refill on your cardiac medications before your next appointment, please call your pharmacy*   Lab Work: None ordered If you have labs (blood work) drawn today and your tests are completely normal, you will receive your results only by: Marland Kitchen MyChart Message (if you have MyChart) OR . A paper copy in the mail If you have any lab test that is abnormal or we need to change your treatment, we will call you to review the  results.   Testing/Procedures: None ordered   Follow-Up: At [facility], you and your health needs are our priority.  As part of our continuing mission to provide you with exceptional heart care, we have created designated Provider Care Teams.  These Care Teams include your primary Cardiologist (physician) and Advanced Practice Providers (APPs -  Physician Assistants and Nurse Practitioners) who all work together to provide you with the care you need, when you need it.  We recommend signing up for the patient portal called "MyChart".  Sign up information is provided on this After Visit Summary.  MyChart is used to connect with patients for Virtual Visits (Telemedicine).  Patients are able to view lab/test results, encounter notes, upcoming appointments, etc.  Non-urgent messages can be sent to your provider as well.   To learn more about what you can do with MyChart, go to [email or web address].    Your next appointment:   3 month(s)  The format for your next appointment:   In Person  Provider:   Kate Sable, MD   Other Instructions N/A      Signed, Kate Sable, MD  10/08/2020 5:17 PM    [place]

## 2020-10-08 NOTE — Patient Instructions (Signed)
Medication Instructions:  Your physician has recommended you make the following change in your medication:   STOP Lisinopril  STOP Metoprolol  REDUCE Aspirin to 81 mg daily.   *If you need a refill on your cardiac medications before your next appointment, please call your pharmacy*   Lab Work: None ordered If you have labs (blood work) drawn today and your tests are completely normal, you will receive your results only by: Marland Kitchen MyChart Message (if you have MyChart) OR . A paper copy in the mail If you have any lab test that is abnormal or we need to change your treatment, we will call you to review the results.   Testing/Procedures: None ordered   Follow-Up: At [facility], you and your health needs are our priority.  As part of our continuing mission to provide you with exceptional heart care, we have created designated Provider Care Teams.  These Care Teams include your primary Cardiologist (physician) and Advanced Practice Providers (APPs -  Physician Assistants and Nurse Practitioners) who all work together to provide you with the care you need, when you need it.  We recommend signing up for the patient portal called "MyChart".  Sign up information is provided on this After Visit Summary.  MyChart is used to connect with patients for Virtual Visits (Telemedicine).  Patients are able to view lab/test results, encounter notes, upcoming appointments, etc.  Non-urgent messages can be sent to your provider as well.   To learn more about what you can do with MyChart, go to [email or web address].    Your next appointment:   3 month(s)  The format for your next appointment:   In Person  Provider:   Kate Sable, MD   Other Instructions N/A

## 2020-10-09 ENCOUNTER — Encounter: Payer: Self-pay | Admitting: Family Medicine

## 2020-10-09 DIAGNOSIS — I714 Abdominal aortic aneurysm, without rupture, unspecified: Secondary | ICD-10-CM | POA: Insufficient documentation

## 2020-10-09 DIAGNOSIS — I77811 Abdominal aortic ectasia: Secondary | ICD-10-CM | POA: Insufficient documentation

## 2020-10-11 ENCOUNTER — Encounter: Payer: Medicare HMO | Attending: Internal Medicine

## 2020-10-11 ENCOUNTER — Other Ambulatory Visit: Payer: Self-pay

## 2020-10-11 VITALS — Ht 62.0 in | Wt 216.4 lb

## 2020-10-11 DIAGNOSIS — Z951 Presence of aortocoronary bypass graft: Secondary | ICD-10-CM | POA: Insufficient documentation

## 2020-10-11 NOTE — Progress Notes (Signed)
Cardiac Individual Treatment Plan  Patient Details  Name: [name] MRN: [id number] Date of Birth: [birth year] Referring Provider:   Flowsheet Row Cardiac Rehab from 10/11/2020 in [facility] Cardiac and Pulmonary Rehab  Referring Provider End      Initial Encounter Date:  Flowsheet Row Cardiac Rehab from 10/11/2020 in [facility] Cardiac and Pulmonary Rehab  Date 10/11/20      Visit Diagnosis: S/P CABG x 1  Patient's Home Medications on Admission:  Current Outpatient Medications:  .  albuterol (VENTOLIN HFA) 108 (90 Base) MCG/ACT inhaler, Inhale 2 puffs into the lungs every 6 (six) hours as needed for wheezing or shortness of breath., Disp: 8 g, Rfl: 3 .  Apple Cider Vinegar 600 MG CAPS, , Disp: , Rfl:  .  aspirin 81 MG EC tablet, Take 1 tablet (81 mg total) by mouth daily., Disp: , Rfl:  .  Boswellia-Glucosamine-Vit D (OSTEO BI-FLEX ONE PER DAY PO), Take 1 tablet by mouth daily., Disp: , Rfl:  .  butalbital-acetaminophen-caffeine (FIORICET) 50-325-40 MG tablet, Take 1 tablet by mouth 2 (two) times daily as needed for headache or migraine., Disp: 20 tablet, Rfl: 1 .  famotidine (PEPCID) 20 MG tablet, Take 20 mg by mouth every other day. Alternates with omeprazole, Disp: , Rfl:  .  INS SYRINGE/NEEDLE 1CC/28G (B-D INS SYR MICROFINE 1CC/28G) 28G X 1/2" 1 ML MISC, USE AS DIRECTED, Disp: 100 each, Rfl: 3 .  insulin NPH Human (NOVOLIN N) 100 UNIT/ML injection, Inject 0.2 mLs (20 Units total) into the skin daily after supper., Disp: , Rfl:  .  levothyroxine (SYNTHROID) 100 MCG tablet, TAKE 1 TABLET DAILY, AND ON2 DAYS A WEEK, TAKE AN     EXTRA 1/2 TABLET, Disp: 90 tablet, Rfl: 3 .  loratadine (CLARITIN) 10 MG tablet, Take 1 tablet (10 mg total) by mouth daily., Disp: , Rfl:  .  metFORMIN (GLUCOPHAGE) 500 MG tablet, Take 500 mg by mouth 2 (two) times daily with a meal., Disp: , Rfl:  .  Multiple Vitamin (MULTIVITAMIN WITH MINERALS) TABS tablet, Take 1 tablet by mouth daily. One A Day for Women, Disp: ,  Rfl:  .  nystatin cream (MYCOSTATIN), APPLY TO AFFECTED AREA TWICE A DAY, Disp: 30 g, Rfl: 1 .  omeprazole (PRILOSEC) 20 MG capsule, TAKE 1 DAILY-ALTERNATES WITH FAMOTIDINE, Disp: 90 capsule, Rfl: 1 .  ONE TOUCH LANCETS MISC, Use to check sugar twice daily Dx: E11.8, Disp: 200 each, Rfl: 3 .  ONETOUCH ULTRA test strip, USE TO CHECK BLOOD SUGAR 2 TIMES   DAILY, Disp: 200 strip, Rfl: 3 .  OXYGEN, Inhale 2 L into the lungs at bedtime., Disp: , Rfl:  .  pioglitazone (ACTOS) 15 MG tablet, TAKE 1 TABLET DAILY, Disp: 90 tablet, Rfl: 0 .  Rhubarb (ESTROVEN COMPLETE PO), Take 1 tablet by mouth daily., Disp: , Rfl:  .  simvastatin (ZOCOR) 40 MG tablet, TAKE 1 TABLET AT BEDTIME, Disp: 90 tablet, Rfl: 3 .  topiramate (TOPAMAX) 100 MG tablet, Take 1 tablet (100 mg total) by mouth at bedtime., Disp: 90 tablet, Rfl: 1 .  traMADol (ULTRAM) 50 MG tablet, Take 1 tablet (50 mg total) by mouth every 8 (eight) hours as needed for up to 5 days., Disp: 15 tablet, Rfl: 0 .  umeclidinium-vilanterol (ANORO ELLIPTA) 62.5-25 MCG/INH AEPB, Inhale 1 puff into the lungs daily., Disp: 60 each, Rfl: 5 .  ziprasidone (GEODON) 40 MG capsule, Take 1 capsule (40 mg total) by mouth in the morning and at bedtime., Disp: ,  Rfl:   Past Medical History: Past Medical History:  Diagnosis Date  . Anxiety   . Arthritis    per patient "all over body"  . Bipolar disorder ([place])    has stopped seeing psychiatrist  . CAD (coronary artery disease)    a. 06/2020 MV: small, sev, apical inf/apical defect-scar vs ischemia. EF 45%; b. 06/2020 Cath: LM 16m, LAD 70ost, 3m, LCX nl, RCA nl; c. 07/2020 CABG x 1: LIMA->LAD.  Marland Kitchen Cataract   . Chronic bronchitis   . COPD (chronic obstructive pulmonary disease) ([place])   . Coronary artery disease, non-occlusive   . Depression   . Dyspnea 2012   s/p pulm/cards w/u WNL, thought due to obesity/deconditioning  . Fibromyalgia   . GERD (gastroesophageal reflux disease)   . HFmrEF (heart failure with  mid-range ejection fraction) ([place])    a. 05/2020 Echo: EF 40%, glob HK, mild LVH, Gr1 DD; b. 06/2020 EF by SPECT: 45%; c. 07/2020 intraop TEE: EF 50%.  . History of chicken pox   . HLD (hyperlipidemia)   . Hypertension   . Hypothyroidism   . Ischemic cardiomyopathy    a. 05/2020 Echo: EF 40%; b. 06/2020 EF by SPECT: 45%; c. 07/2020 intraop TEE: EF 50%.  . Migraine   . OSA (obstructive sleep apnea) 03/16/2011  . Pneumonia   . Sleep apnea    per patient cannot tolerate CPAP  . T2DM (type 2 diabetes mellitus) ([place]) 2012   DM education 06/2011  . Urinary incontinence     Tobacco Use: Social History   Tobacco Use  Smoking Status Former Smoker  . Packs/day: 2.00  . Years: 43.00  . Pack years: 86.00  . Types: Cigarettes  . Quit date: 05/17/2010  . Years since quitting: 10.4  Smokeless Tobacco Never Used  Tobacco Comment   QUIT 2011    Labs: Recent Review Flowsheet Data    Labs for ITP Cardiac and Pulmonary Rehab Latest Ref Rng & Units 08/03/2020 08/03/2020 08/03/2020 08/03/2020 08/03/2020   Cholestrol 0 - 200 mg/dL - - - - -   LDLCALC 0 - 99 mg/dL - - - - -   LDLDIRECT mg/dL - - - - -   HDL >39.00 mg/dL - - - - -   Trlycerides 0.0 - 149.0 mg/dL - - - - -   Hemoglobin A1c 4.8 - 5.6 % - - - - -   PHART 7.350 - 7.450 7.385 - 7.230(L) 7.388 7.369   PCO2ART 32.0 - 48.0 mmHg 41.3 - 57.9(H) 36.4 40.1   HCO3 20.0 - 28.0 mmol/L 24.7 - 24.7 22.0 23.1   TCO2 22 - 32 mmol/L 26 23 27 23 24    ACIDBASEDEF 0.0 - 2.0 mmol/L - - 4.0(H) 3.0(H) 2.0   O2SAT % 100.0 - 98.0 99.0 99.0       Exercise Target Goals: Exercise Program Goal: Individual exercise prescription set using results from initial 6 min walk test and THRR while considering  patient's activity barriers and safety.   Exercise Prescription Goal: Initial exercise prescription builds to 30-45 minutes a day of aerobic activity, 2-3 days per week.  Home exercise guidelines will be given to patient during program as part of  exercise prescription that the participant will acknowledge.   Education: Aerobic Exercise: - Group verbal and visual presentation on the components of exercise prescription. Introduces F.I.T.T principle from ACSM for exercise prescriptions.  Reviews F.I.T.T. principles of aerobic exercise including progression. Written material given at graduation.   Education: Resistance Exercise: -  Group verbal and visual presentation on the components of exercise prescription. Introduces F.I.T.T principle from ACSM for exercise prescriptions  Reviews F.I.T.T. principles of resistance exercise including progression. Written material given at graduation.    Education: Exercise & Equipment Safety: - Individual verbal instruction and demonstration of equipment use and safety with use of the equipment. Flowsheet Row Cardiac Rehab from 10/11/2020 in [facility] Cardiac and Pulmonary Rehab  Date 10/11/20  Educator AS  Instruction Review Code 1- Verbalizes Understanding      Education: Exercise Physiology & General Exercise Guidelines: - Group verbal and written instruction with models to review the exercise physiology of the cardiovascular system and associated critical values. Provides general exercise guidelines with specific guidelines to those with heart or lung disease.    Education: Flexibility, Balance, Mind/Body Relaxation: - Group verbal and visual presentation with interactive activity on the components of exercise prescription. Introduces F.I.T.T principle from ACSM for exercise prescriptions. Reviews F.I.T.T. principles of flexibility and balance exercise training including progression. Also discusses the mind body connection.  Reviews various relaxation techniques to help reduce and manage stress (i.e. Deep breathing, progressive muscle relaxation, and visualization). Balance handout provided to take home. Written material given at graduation.   Activity Barriers & Risk Stratification:  Activity  Barriers & Cardiac Risk Stratification - 09/01/20 1538      Activity Barriers & Cardiac Risk Stratification   Activity Barriers Shortness of Breath;Joint Problems   bad right knee, breathing problems   Cardiac Risk Stratification High           6 Minute Walk:  6 Minute Walk    Row Name 10/11/20 1545         6 Minute Walk   Phase Initial     Distance 265 feet     Walk Time 4 minutes     # of Rest Breaks 2     MPH 0.75     METS 0.5     RPE 15     Perceived Dyspnea  3     VO2 Peak 1.73     Symptoms Yes (comment)     Comments SOB, bak and knee pain 6/10     Resting HR 75 bpm     Resting BP 126/60     Resting Oxygen Saturation  99 %     Exercise Oxygen Saturation  during 6 min walk 100 %     Max Ex. HR 98 bpm     Max Ex. BP 132/66     2 Minute Post BP 124/64            Oxygen Initial Assessment:  Oxygen Initial Assessment - 09/01/20 1541      Home Oxygen   Home Oxygen Device Home Concentrator;E-Tanks    Sleep Oxygen Prescription Continuous    Liters per minute 3    Home Exercise Oxygen Prescription None    Home Resting Oxygen Prescription None    Compliance with Home Oxygen Use Yes      Intervention   Short Term Goals To learn and understand importance of monitoring SPO2 with pulse oximeter and demonstrate accurate use of the pulse oximeter.;To learn and understand importance of maintaining oxygen saturations>88%;To learn and demonstrate proper pursed lip breathing techniques or other breathing techniques.;To learn and demonstrate proper use of respiratory medications    Long  Term Goals Exhibits compliance with exercise, home and travel O2 prescription;Verbalizes importance of monitoring SPO2 with pulse oximeter and return demonstration;Maintenance of O2 saturations>88%;Exhibits proper breathing techniques,  such as pursed lip breathing or other method taught during program session;Compliance with respiratory medication;Demonstrates proper use of MDI's            Oxygen Re-Evaluation:   Oxygen Discharge (Final Oxygen Re-Evaluation):   Initial Exercise Prescription:  Initial Exercise Prescription - 10/11/20 1600      Date of Initial Exercise RX and Referring Provider   Date 10/11/20    Referring Provider End      Treadmill   MPH 0.7    Grade 0    Minutes 15      NuStep   Level 1    SPM 80    Minutes 15    METs 1      T5 Nustep   Level 1    SPM 80    Minutes 15    METs 1      Biostep-RELP   Level 1    SPM 50    Minutes 15    METs 1      Prescription Details   Frequency (times per week) 3    Duration Progress to 30 minutes of continuous aerobic without signs/symptoms of physical distress      Intensity   THRR 40-80% of Max Heartrate 106-137    Ratings of Perceived Exertion 11-13    Perceived Dyspnea 0-4      Resistance Training   Training Prescription Yes    Weight 3 lb    Reps 10-15           Perform Capillary Blood Glucose checks as needed.  Exercise Prescription Changes:  Exercise Prescription Changes    Row Name 10/11/20 1600             Response to Exercise   Blood Pressure (Admit) 126/60       Blood Pressure (Exercise) 132/66       Blood Pressure (Exit) 124/64       Heart Rate (Admit) 75 bpm       Heart Rate (Exercise) 98 bpm       Heart Rate (Exit) 95 bpm       Oxygen Saturation (Admit) 99 %       Oxygen Saturation (Exercise) 100 %       Rating of Perceived Exertion (Exercise) 15       Perceived Dyspnea (Exercise) 3       Symptoms back,knee pain              Exercise Comments:   Exercise Goals and Review:  Exercise Goals    Row Name 10/11/20 1606             Exercise Goals   Increase Physical Activity Yes       Intervention Provide advice, education, support and counseling about physical activity/exercise needs.;Develop an individualized exercise prescription for aerobic and resistive training based on initial evaluation findings, risk stratification, comorbidities and  participant's personal goals.       Expected Outcomes Short Term: Attend rehab on a regular basis to increase amount of physical activity.;Long Term: Add in home exercise to make exercise part of routine and to increase amount of physical activity.;Long Term: Exercising regularly at least 3-5 days a week.       Increase Strength and Stamina Yes       Intervention Provide advice, education, support and counseling about physical activity/exercise needs.;Develop an individualized exercise prescription for aerobic and resistive training based on initial evaluation findings, risk stratification, comorbidities and participant's personal goals.  Expected Outcomes Short Term: Increase workloads from initial exercise prescription for resistance, speed, and METs.;Short Term: Perform resistance training exercises routinely during rehab and add in resistance training at home;Long Term: Improve cardiorespiratory fitness, muscular endurance and strength as measured by increased METs and functional capacity (6MWT)       Able to understand and use rate of perceived exertion (RPE) scale Yes       Intervention Provide education and explanation on how to use RPE scale       Expected Outcomes Short Term: Able to use RPE daily in rehab to express subjective intensity level;Long Term:  Able to use RPE to guide intensity level when exercising independently       Able to understand and use Dyspnea scale Yes       Intervention Provide education and explanation on how to use Dyspnea scale       Expected Outcomes Short Term: Able to use Dyspnea scale daily in rehab to express subjective sense of shortness of breath during exertion;Long Term: Able to use Dyspnea scale to guide intensity level when exercising independently       Knowledge and understanding of Target Heart Rate Range (THRR) Yes       Intervention Provide education and explanation of THRR including how the numbers were predicted and where they are located for  reference       Expected Outcomes Short Term: Able to state/look up THRR;Short Term: Able to use daily as guideline for intensity in rehab;Long Term: Able to use THRR to govern intensity when exercising independently       Able to check pulse independently Yes       Intervention Provide education and demonstration on how to check pulse in carotid and radial arteries.;Review the importance of being able to check your own pulse for safety during independent exercise       Expected Outcomes Short Term: Able to explain why pulse checking is important during independent exercise;Long Term: Able to check pulse independently and accurately       Understanding of Exercise Prescription Yes       Intervention Provide education, explanation, and written materials on patient's individual exercise prescription       Expected Outcomes Short Term: Able to explain program exercise prescription;Long Term: Able to explain home exercise prescription to exercise independently              Exercise Goals Re-Evaluation :   Discharge Exercise Prescription (Final Exercise Prescription Changes):  Exercise Prescription Changes - 10/11/20 1600      Response to Exercise   Blood Pressure (Admit) 126/60    Blood Pressure (Exercise) 132/66    Blood Pressure (Exit) 124/64    Heart Rate (Admit) 75 bpm    Heart Rate (Exercise) 98 bpm    Heart Rate (Exit) 95 bpm    Oxygen Saturation (Admit) 99 %    Oxygen Saturation (Exercise) 100 %    Rating of Perceived Exertion (Exercise) 15    Perceived Dyspnea (Exercise) 3    Symptoms back,knee pain           Nutrition:  Target Goals: Understanding of nutrition guidelines, daily intake of sodium 1500mg , cholesterol 200mg , calories 30% from fat and 7% or less from saturated fats, daily to have 5 or more servings of fruits and vegetables.  Education: All About Nutrition: -Group instruction provided by verbal, written material, interactive activities, discussions, models,  and posters to present general guidelines for heart healthy nutrition including fat,  fiber, MyPlate, the role of sodium in heart healthy nutrition, utilization of the nutrition label, and utilization of this knowledge for meal planning. Follow up email sent as well. Written material given at graduation.   Biometrics:  Pre Biometrics - 10/11/20 1611      Pre Biometrics   Height 5\' 2"  (1.575 m)    Weight 216 lb 6.4 oz (98.2 kg)    BMI (Calculated) 39.57            Nutrition Therapy Plan and Nutrition Goals:   Nutrition Assessments:  MEDIFICTS Score Key:  ?70 Need to make dietary changes   40-70 Heart Healthy Diet  ? 40 Therapeutic Level Cholesterol Diet  Flowsheet Row Cardiac Rehab from 10/11/2020 in [facility] Cardiac and Pulmonary Rehab  Picture Your Plate Total Score on Admission 51     Picture Your Plate Scores:  <81 Unhealthy dietary pattern with much room for improvement.  41-50 Dietary pattern unlikely to meet recommendations for good health and room for improvement.  51-60 More healthful dietary pattern, with some room for improvement.   >60 Healthy dietary pattern, although there may be some specific behaviors that could be improved.    Nutrition Goals Re-Evaluation:   Nutrition Goals Discharge (Final Nutrition Goals Re-Evaluation):   Psychosocial: Target Goals: Acknowledge presence or absence of significant depression and/or stress, maximize coping skills, provide positive support system. Participant is able to verbalize types and ability to use techniques and skills needed for reducing stress and depression.   Education: Stress, Anxiety, and Depression - Group verbal and visual presentation to define topics covered.  Reviews how body is impacted by stress, anxiety, and depression.  Also discusses healthy ways to reduce stress and to treat/manage anxiety and depression.  Written material given at graduation.   Education: Sleep Hygiene -Provides group verbal  and written instruction about how sleep can affect your health.  Define sleep hygiene, discuss sleep cycles and impact of sleep habits. Review good sleep hygiene tips.    Initial Review & Psychosocial Screening:  Initial Psych Review & Screening - 09/01/20 1542      Initial Review   Current issues with Current Psychotropic Meds   Bipolar  on meds controlling well.     Family Dynamics   Good Support System? Yes   Husband of 79 years, twin sons live in same neighborhood     Barriers   Psychosocial barriers to participate in program There are no identifiable barriers or psychosocial needs.;The patient should benefit from training in stress management and relaxation.;Psychosocial barriers identified (see note)      Screening Interventions   Interventions To provide support and resources with identified psychosocial needs;Provide feedback about the scores to participant    Expected Outcomes Short Term goal: Utilizing psychosocial counselor, staff and physician to assist with identification of specific Stressors or current issues interfering with healing process. Setting desired goal for each stressor or current issue identified.;Long Term Goal: Stressors or current issues are controlled or eliminated.;Short Term goal: Identification and review with participant of any Quality of Life or Depression concerns found by scoring the questionnaire.;Long Term goal: The participant improves quality of Life and PHQ9 Scores as seen by post scores and/or verbalization of changes           Quality of Life Scores:   Quality of Life - 10/11/20 1612      Quality of Life   Select Quality of Life      Quality of Life Scores   Health/Function  Pre 18.93 %    Socioeconomic Pre 18.44 %    Psych/Spiritual Pre 30 %    Family Pre 24 %    GLOBAL Pre 21.76 %          Scores of 19 and below usually indicate a poorer quality of life in these areas.  A difference of  2-3 points is a clinically meaningful  difference.  A difference of 2-3 points in the total score of the Quality of Life Index has been associated with significant improvement in overall quality of life, self-image, physical symptoms, and general health in studies assessing change in quality of life.  PHQ-9: Recent Review Flowsheet Data    Depression screen [facility] 2/9 10/11/2020 05/26/2020 05/26/2020 04/24/2019 04/15/2018   Decreased Interest 1 0 0 0 0   Down, Depressed, Hopeless 0 0 0 0 0   PHQ - 2 Score 1 0 0 0 0   Altered sleeping 1 0 - - 0   Tired, decreased energy 1 0 - - 0   Change in appetite 2 0 - - 0   Feeling bad or failure about yourself  0 0 - - 0   Trouble concentrating 0 0 - - 0   Moving slowly or fidgety/restless 0 0 - - 0   Suicidal thoughts 0 0 - - 0   PHQ-9 Score 5 0 - - 0   Difficult doing work/chores Not difficult at all - - - Not difficult at all     Interpretation of Total Score  Total Score Depression Severity:  1-4 = Minimal depression, 5-9 = Mild depression, 10-14 = Moderate depression, 15-19 = Moderately severe depression, 20-27 = Severe depression   Psychosocial Evaluation and Intervention:  Psychosocial Evaluation - 09/01/20 1556      Psychosocial Evaluation & Interventions   Comments [name] has no barriers to attending the program. She lives in her home with her husband of 53 years. She is ready to get started and see if she can improve her breathing and her stamina. She does have a bad right knee and will need to be careful not to aggravate it during her exercise. She does use a walker and a cane at times.  Her husband is her support and she has twin sons that are very helpful, that live in the same neighborhood. She is ready to start the program.    Expected Outcomes STG: [name] is able to attend all scheduled sessions LTG: [name] is able to maintain all she has learned in the program    Continue Psychosocial Services  Follow up required by staff           Psychosocial Re-Evaluation:   Psychosocial  Discharge (Final Psychosocial Re-Evaluation):   Vocational Rehabilitation: Provide vocational rehab assistance to qualifying candidates.   Vocational Rehab Evaluation & Intervention:  Vocational Rehab - 09/01/20 1546      Initial Vocational Rehab Evaluation & Intervention   Assessment shows need for Vocational Rehabilitation No           Education: Education Goals: Education classes will be provided on a variety of topics geared toward better understanding of heart health and risk factor modification. Participant will state understanding/return demonstration of topics presented as noted by education test scores.  Learning Barriers/Preferences:  Learning Barriers/Preferences - 09/01/20 1546      Learning Barriers/Preferences   Learning Barriers None    Learning Preferences None           General Cardiac Education Topics:  AED/CPR: - Group verbal and written instruction with the use of models to demonstrate the basic use of the AED with the basic ABC's of resuscitation.   Anatomy and Cardiac Procedures: - Group verbal and visual presentation and models provide information about basic cardiac anatomy and function. Reviews the testing methods done to diagnose heart disease and the outcomes of the test results. Describes the treatment choices: Medical Management, Angioplasty, or Coronary Bypass Surgery for treating various heart conditions including Myocardial Infarction, Angina, Valve Disease, and Cardiac Arrhythmias.  Written material given at graduation.   Medication Safety: - Group verbal and visual instruction to review commonly prescribed medications for heart and lung disease. Reviews the medication, class of the drug, and side effects. Includes the steps to properly store meds and maintain the prescription regimen.  Written material given at graduation.   Intimacy: - Group verbal instruction through game format to discuss how heart and lung disease can affect sexual  intimacy. Written material given at graduation..   Know Your Numbers and Heart Failure: - Group verbal and visual instruction to discuss disease risk factors for cardiac and pulmonary disease and treatment options.  Reviews associated critical values for Overweight/Obesity, Hypertension, Cholesterol, and Diabetes.  Discusses basics of heart failure: signs/symptoms and treatments.  Introduces Heart Failure Zone chart for action plan for heart failure.  Written material given at graduation.   Infection Prevention: - Provides verbal and written material to individual with discussion of infection control including proper hand washing and proper equipment cleaning during exercise session. Flowsheet Row Cardiac Rehab from 10/11/2020 in [facility] Cardiac [facility] Pul[facility] Re[facility]  Date 10/11/20  Educator AS  Instruction Review Code 1- Verbalizes Understanding      Falls Prevention: - Provides verbal and written material to individual with discussion of falls prevention and safety. Flowsheet Row Cardiac Rehab from 10/11/2020 in [facility]  Date 10/11/20  Educator AS  Instruction Review Code 1- Verbalizes Understanding      Other: -Provides group and verbal instruction on various topics (see comments)   Knowledge Questionnaire Score:  Knowledge Questionnaire Score - 10/11/20 1612      Knowledge Questionnaire Score   Pre Score 21/26 exercise/nutrition/heart           Core Components/Risk Factors/Patient Goals at Admission:  Personal Goals and Risk Factors at Admission - 10/11/20 1618      Core Components/Risk Factors/Patient Goals on Admission    Weight Management Yes    Intervention Weight Management: Develop a combined nutrition and exercise program designed to reach desired caloric intake, while maintaining appropriate intake of nutrient and fiber, sodium and fats, and appropriate energy expenditure required for the weight goal.;Weight Management: Provide education and  appropriate resources to help participant work on and attain dietary goals.;Weight Management/Obesity: Establish reasonable short term and long term weight goals.;Obesity: Provide education and appropriate resources to help participant work on and attain dietary goals.    Admit Weight 216 lb 6.4 oz (98.2 kg)    Goal Weight: Short Term 210 lb (95.3 kg)    Goal Weight: Long Term 175 lb (79.4 kg)    Expected Outcomes Short Term: Continue to assess and modify interventions until short term weight is achieved;Long Term: Adherence to nutrition and physical activity/exercise program aimed toward attainment of established weight goal;Weight Loss: Understanding of general recommendations for a balanced deficit meal plan, which promotes 1-2 lb weight loss per week and includes a negative energy balance of [phone] kcal/d    Diabetes Yes  Intervention Provide education about signs/symptoms and action to take for hypo/hyperglycemia.;Provide education about proper nutrition, including hydration, and aerobic/resistive exercise prescription along with prescribed medications to achieve blood glucose in normal ranges: Fasting glucose 65-99 mg/dL    Expected Outcomes Short Term: Participant verbalizes understanding of the signs/symptoms and immediate care of hyper/hypoglycemia, proper foot care and importance of medication, aerobic/resistive exercise and nutrition plan for blood glucose control.;Long Term: Attainment of HbA1C < 7%.    Intervention Provide education on lifestyle modifcations including regular physical activity/exercise, weight management, moderate sodium restriction and increased consumption of fresh fruit, vegetables, and low fat dairy, alcohol moderation, and smoking cessation.;Monitor prescription use compliance.    Expected Outcomes Short Term: Continued assessment and intervention until BP is < 140/49mm HG in hypertensive participants. < 130/53mm HG in hypertensive participants with diabetes, heart  failure or chronic kidney disease.;Long Term: Maintenance of blood pressure at goal levels.    Intervention Provide education and support for participant on nutrition & aerobic/resistive exercise along with prescribed medications to achieve LDL 70mg , HDL >40mg .    Expected Outcomes Short Term: Participant states understanding of desired cholesterol values and is compliant with medications prescribed. Participant is following exercise prescription and nutrition guidelines.;Long Term: Cholesterol controlled with medications as prescribed, with individualized exercise RX and with personalized nutrition plan. Value goals: LDL < 70mg , HDL > 40 mg.           Education:Diabetes - Individual verbal and written instruction to review signs/symptoms of diabetes, desired ranges of glucose level fasting, after meals and with exercise. Acknowledge that pre and post exercise glucose checks will be done for 3 sessions at entry of program.   Core Components/Risk Factors/Patient Goals Review:    Core Components/Risk Factors/Patient Goals at Discharge (Final Review):    ITP Comments:  ITP Comments    Row Name 09/01/20 1554           ITP Comments Virtual orientation call completed today. shehas an appointment on Date: 09/23/2020 for EP eval and gym Orientation.  Documentation of diagnosis can be found in [facility] Date: 08/03/2021.              Comments: initial ITP

## 2020-10-11 NOTE — Patient Instructions (Signed)
Patient Instructions  Patient Details  Name: [name] MRN: [id number] Date of Birth: [birth year] Referring Provider:  Nelva Bush, MD  Below are your personal goals for exercise, nutrition, and risk factors. Our goal is to help you stay on track towards obtaining and maintaining these goals. We will be discussing your progress on these goals with you throughout the program.  Initial Exercise Prescription:  Initial Exercise Prescription - 10/11/20 1600      Date of Initial Exercise RX and Referring Provider   Date 10/11/20    Referring Provider End      Treadmill   MPH 0.7    Grade 0    Minutes 15      NuStep   Level 1    SPM 80    Minutes 15    METs 1      T5 Nustep   Level 1    SPM 80    Minutes 15    METs 1      Biostep-RELP   Level 1    SPM 50    Minutes 15    METs 1      Prescription Details   Frequency (times per week) 3    Duration Progress to 30 minutes of continuous aerobic without signs/symptoms of physical distress      Intensity   THRR 40-80% of Max Heartrate 106-137    Ratings of Perceived Exertion 11-13    Perceived Dyspnea 0-4      Resistance Training   Training Prescription Yes    Weight 3 lb    Reps 10-15           Exercise Goals: Frequency: Be able to perform aerobic exercise two to three times per week in program working toward 2-5 days per week of home exercise.  Intensity: Work with a perceived exertion of 11 (fairly light) - 15 (hard) while following your exercise prescription.  We will make changes to your prescription with you as you progress through the program.   Duration: Be able to do 30 to 45 minutes of continuous aerobic exercise in addition to a 5 minute warm-up and a 5 minute cool-down routine.   Nutrition Goals: Your personal nutrition goals will be established when you do your nutrition analysis with the dietician.  The following are general nutrition guidelines to follow: Cholesterol < 200mg /day Sodium <  1500mg /day Fiber: Women over 50 yrs - 21 grams per day  Personal Goals:  Personal Goals and Risk Factors at Admission - 10/11/20 1618      Core Components/Risk Factors/Patient Goals on Admission    Weight Management Yes    Intervention Weight Management: Develop a combined nutrition and exercise program designed to reach desired caloric intake, while maintaining appropriate intake of nutrient and fiber, sodium and fats, and appropriate energy expenditure required for the weight goal.;Weight Management: Provide education and appropriate resources to help participant work on and attain dietary goals.;Weight Management/Obesity: Establish reasonable short term and long term weight goals.;Obesity: Provide education and appropriate resources to help participant work on and attain dietary goals.    Admit Weight 216 lb 6.4 oz (98.2 kg)    Goal Weight: Short Term 210 lb (95.3 kg)    Goal Weight: Long Term 175 lb (79.4 kg)    Expected Outcomes Short Term: Continue to assess and modify interventions until short term weight is achieved;Long Term: Adherence to nutrition and physical activity/exercise program aimed toward attainment of established weight goal;Weight Loss: Understanding of general recommendations  for a balanced deficit meal plan, which promotes 1-2 lb weight loss per week and includes a negative energy balance of [phone] kcal/d    Diabetes Yes    Intervention Provide education about signs/symptoms and action to take for hypo/hyperglycemia.;Provide education about proper nutrition, including hydration, and aerobic/resistive exercise prescription along with prescribed medications to achieve blood glucose in normal ranges: Fasting glucose 65-99 mg/dL    Expected Outcomes Short Term: Participant verbalizes understanding of the signs/symptoms and immediate care of hyper/hypoglycemia, proper foot care and importance of medication, aerobic/resistive exercise and nutrition plan for blood glucose  control.;Long Term: Attainment of HbA1C < 7%.    Intervention Provide education on lifestyle modifcations including regular physical activity/exercise, weight management, moderate sodium restriction and increased consumption of fresh fruit, vegetables, and low fat dairy, alcohol moderation, and smoking cessation.;Monitor prescription use compliance.    Expected Outcomes Short Term: Continued assessment and intervention until BP is < 140/77mm HG in hypertensive participants. < 130/43mm HG in hypertensive participants with diabetes, heart failure or chronic kidney disease.;Long Term: Maintenance of blood pressure at goal levels.    Intervention Provide education and support for participant on nutrition & aerobic/resistive exercise along with prescribed medications to achieve LDL 70mg , HDL >40mg .    Expected Outcomes Short Term: Participant states understanding of desired cholesterol values and is compliant with medications prescribed. Participant is following exercise prescription and nutrition guidelines.;Long Term: Cholesterol controlled with medications as prescribed, with individualized exercise RX and with personalized nutrition plan. Value goals: LDL < 70mg , HDL > 40 mg.           Tobacco Use Initial Evaluation: Social History   Tobacco Use  Smoking Status Former Smoker  . Packs/day: 2.00  . Years: 43.00  . Pack years: 86.00  . Types: Cigarettes  . Quit date: 05/17/2010  . Years since quitting: 10.4  Smokeless Tobacco Never Used  Tobacco Comment   QUIT 2011    Exercise Goals and Review:  Exercise Goals    Row Name 10/11/20 1606             Exercise Goals   Increase Physical Activity Yes       Intervention Provide advice, education, support and counseling about physical activity/exercise needs.;Develop an individualized exercise prescription for aerobic and resistive training based on initial evaluation findings, risk stratification, comorbidities and participant's personal  goals.       Expected Outcomes Short Term: Attend rehab on a regular basis to increase amount of physical activity.;Long Term: Add in home exercise to make exercise part of routine and to increase amount of physical activity.;Long Term: Exercising regularly at least 3-5 days a week.       Increase Strength and Stamina Yes       Intervention Provide advice, education, support and counseling about physical activity/exercise needs.;Develop an individualized exercise prescription for aerobic and resistive training based on initial evaluation findings, risk stratification, comorbidities and participant's personal goals.       Expected Outcomes Short Term: Increase workloads from initial exercise prescription for resistance, speed, and METs.;Short Term: Perform resistance training exercises routinely during rehab and add in resistance training at home;Long Term: Improve cardiorespiratory fitness, muscular endurance and strength as measured by increased METs and functional capacity (6MWT)       Able to understand and use rate of perceived exertion (RPE) scale Yes       Intervention Provide education and explanation on how to use RPE scale       Expected Outcomes  Short Term: Able to use RPE daily in rehab to express subjective intensity level;Long Term:  Able to use RPE to guide intensity level when exercising independently       Able to understand and use Dyspnea scale Yes       Intervention Provide education and explanation on how to use Dyspnea scale       Expected Outcomes Short Term: Able to use Dyspnea scale daily in rehab to express subjective sense of shortness of breath during exertion;Long Term: Able to use Dyspnea scale to guide intensity level when exercising independently       Knowledge and understanding of Target Heart Rate Range (THRR) Yes       Intervention Provide education and explanation of THRR including how the numbers were predicted and where they are located for reference       Expected  Outcomes Short Term: Able to state/look up THRR;Short Term: Able to use daily as guideline for intensity in rehab;Long Term: Able to use THRR to govern intensity when exercising independently       Able to check pulse independently Yes       Intervention Provide education and demonstration on how to check pulse in carotid and radial arteries.;Review the importance of being able to check your own pulse for safety during independent exercise       Expected Outcomes Short Term: Able to explain why pulse checking is important during independent exercise;Long Term: Able to check pulse independently and accurately       Understanding of Exercise Prescription Yes       Intervention Provide education, explanation, and written materials on patient's individual exercise prescription       Expected Outcomes Short Term: Able to explain program exercise prescription;Long Term: Able to explain home exercise prescription to exercise independently              Copy of goals given to participant.

## 2020-10-13 ENCOUNTER — Encounter: Payer: Self-pay | Admitting: *Deleted

## 2020-10-13 DIAGNOSIS — Z951 Presence of aortocoronary bypass graft: Secondary | ICD-10-CM

## 2020-10-13 NOTE — Progress Notes (Signed)
Cardiac Individual Treatment Plan  Patient Details  Name: MELVIE PAGLIA MRN: [id number] Date of Birth: [birth year] Referring Provider:   Flowsheet Row Cardiac Rehab from 10/11/2020 in [facility] Cardiac and Pulmonary Rehab  Referring Provider End      Initial Encounter Date:  Flowsheet Row Cardiac Rehab from 10/11/2020 in [facility] Cardiac and Pulmonary Rehab  Date 10/11/20      Visit Diagnosis: S/P CABG x 1  Patient's Home Medications on Admission:  Current Outpatient Medications:  .  albuterol (VENTOLIN HFA) 108 (90 Base) MCG/ACT inhaler, Inhale 2 puffs into the lungs every 6 (six) hours as needed for wheezing or shortness of breath., Disp: 8 g, Rfl: 3 .  Apple Cider Vinegar 600 MG CAPS, , Disp: , Rfl:  .  aspirin 81 MG EC tablet, Take 1 tablet (81 mg total) by mouth daily., Disp: , Rfl:  .  Boswellia-Glucosamine-Vit D (OSTEO BI-FLEX ONE PER DAY PO), Take 1 tablet by mouth daily., Disp: , Rfl:  .  butalbital-acetaminophen-caffeine (FIORICET) 50-325-40 MG tablet, Take 1 tablet by mouth 2 (two) times daily as needed for headache or migraine., Disp: 20 tablet, Rfl: 1 .  famotidine (PEPCID) 20 MG tablet, Take 20 mg by mouth every other day. Alternates with omeprazole, Disp: , Rfl:  .  INS SYRINGE/NEEDLE 1CC/28G (B-D INS SYR MICROFINE 1CC/28G) 28G X 1/2" 1 ML MISC, USE AS DIRECTED, Disp: 100 each, Rfl: 3 .  insulin NPH Human (NOVOLIN N) 100 UNIT/ML injection, Inject 0.2 mLs (20 Units total) into the skin daily after supper., Disp: , Rfl:  .  levothyroxine (SYNTHROID) 100 MCG tablet, TAKE 1 TABLET DAILY, AND ON2 DAYS A WEEK, TAKE AN     EXTRA 1/2 TABLET, Disp: 90 tablet, Rfl: 3 .  loratadine (CLARITIN) 10 MG tablet, Take 1 tablet (10 mg total) by mouth daily., Disp: , Rfl:  .  metFORMIN (GLUCOPHAGE) 500 MG tablet, Take 500 mg by mouth 2 (two) times daily with a meal., Disp: , Rfl:  .  Multiple Vitamin (MULTIVITAMIN WITH MINERALS) TABS tablet, Take 1 tablet by mouth daily. One A Day for Women, Disp: ,  Rfl:  .  nystatin cream (MYCOSTATIN), APPLY TO AFFECTED AREA TWICE A DAY, Disp: 30 g, Rfl: 1 .  omeprazole (PRILOSEC) 20 MG capsule, TAKE 1 DAILY-ALTERNATES WITH FAMOTIDINE, Disp: 90 capsule, Rfl: 1 .  ONE TOUCH LANCETS MISC, Use to check sugar twice daily Dx: E11.8, Disp: 200 each, Rfl: 3 .  ONETOUCH ULTRA test strip, USE TO CHECK BLOOD SUGAR 2 TIMES   DAILY, Disp: 200 strip, Rfl: 3 .  OXYGEN, Inhale 2 L into the lungs at bedtime., Disp: , Rfl:  .  pioglitazone (ACTOS) 15 MG tablet, TAKE 1 TABLET DAILY, Disp: 90 tablet, Rfl: 0 .  Rhubarb (ESTROVEN COMPLETE PO), Take 1 tablet by mouth daily., Disp: , Rfl:  .  simvastatin (ZOCOR) 40 MG tablet, TAKE 1 TABLET AT BEDTIME, Disp: 90 tablet, Rfl: 3 .  topiramate (TOPAMAX) 100 MG tablet, Take 1 tablet (100 mg total) by mouth at bedtime., Disp: 90 tablet, Rfl: 1 .  umeclidinium-vilanterol (ANORO ELLIPTA) 62.5-25 MCG/INH AEPB, Inhale 1 puff into the lungs daily., Disp: 60 each, Rfl: 5 .  ziprasidone (GEODON) 40 MG capsule, Take 1 capsule (40 mg total) by mouth in the morning and at bedtime., Disp: , Rfl:   Past Medical History: Past Medical History:  Diagnosis Date  . Anxiety   . Arthritis    per patient "all over body"  . Bipolar  disorder [facility])    has stopped seeing psychiatrist  . CAD (coronary artery disease)    a. 06/2020 MV: small, sev, apical inf/apical defect-scar vs ischemia. EF 45%; b. 06/2020 Cath: LM 42m, LAD 70ost, 32m, LCX nl, RCA nl; c. 07/2020 CABG x 1: LIMA->LAD.  Marland Kitchen Cataract   . Chronic bronchitis   . COPD (chronic obstructive pulmonary disease) ([place])   . Coronary artery disease, non-occlusive   . Depression   . Dyspnea 2012   s/p pulm/cards w/u WNL, thought due to obesity/deconditioning  . Fibromyalgia   . GERD (gastroesophageal reflux disease)   . HFmrEF (heart failure with mid-range ejection fraction) ([place])    a. 05/2020 Echo: EF 40%, glob HK, mild LVH, Gr1 DD; b. 06/2020 EF by SPECT: 45%; c. 07/2020 intraop TEE: EF 50%.  .  History of chicken pox   . HLD (hyperlipidemia)   . Hypertension   . Hypothyroidism   . Ischemic cardiomyopathy    a. 05/2020 Echo: EF 40%; b. 06/2020 EF by SPECT: 45%; c. 07/2020 intraop TEE: EF 50%.  . Migraine   . OSA (obstructive sleep apnea) 03/16/2011  . Pneumonia   . Sleep apnea    per patient cannot tolerate CPAP  . T2DM (type 2 diabetes mellitus) ([place]) 2012   DM education 06/2011  . Urinary incontinence     Tobacco Use: Social History   Tobacco Use  Smoking Status Former Smoker  . Packs/day: 2.00  . Years: 43.00  . Pack years: 86.00  . Types: Cigarettes  . Quit date: 05/17/2010  . Years since quitting: 10.4  Smokeless Tobacco Never Used  Tobacco Comment   QUIT 2011    Labs: Recent Review Flowsheet Data    Labs for ITP Cardiac and Pulmonary Rehab Latest Ref Rng & Units 08/03/2020 08/03/2020 08/03/2020 08/03/2020 08/03/2020   Cholestrol 0 - 200 mg/dL - - - - -   LDLCALC 0 - 99 mg/dL - - - - -   LDLDIRECT mg/dL - - - - -   HDL >39.00 mg/dL - - - - -   Trlycerides 0.0 - 149.0 mg/dL - - - - -   Hemoglobin A1c 4.8 - 5.6 % - - - - -   PHART 7.350 - 7.450 7.385 - 7.230(L) 7.388 7.369   PCO2ART 32.0 - 48.0 mmHg 41.3 - 57.9(H) 36.4 40.1   HCO3 20.0 - 28.0 mmol/L 24.7 - 24.7 22.0 23.1   TCO2 22 - 32 mmol/L 26 23 27 23 24    ACIDBASEDEF 0.0 - 2.0 mmol/L - - 4.0(H) 3.0(H) 2.0   O2SAT % 100.0 - 98.0 99.0 99.0       Exercise Target Goals: Exercise Program Goal: Individual exercise prescription set using results from initial 6 min walk test and THRR while considering  patient's activity barriers and safety.   Exercise Prescription Goal: Initial exercise prescription builds to 30-45 minutes a day of aerobic activity, 2-3 days per week.  Home exercise guidelines will be given to patient during program as part of exercise prescription that the participant will acknowledge.   Education: Aerobic Exercise: - Group verbal and visual presentation on the components of  exercise prescription. Introduces F.I.T.T principle from ACSM for exercise prescriptions.  Reviews F.I.T.T. principles of aerobic exercise including progression. Written material given at graduation.   Education: Resistance Exercise: - Group verbal and visual presentation on the components of exercise prescription. Introduces F.I.T.T principle from ACSM for exercise prescriptions  Reviews F.I.T.T. principles of resistance exercise including progression. Written  material given at graduation.    Education: Exercise & Equipment Safety: - Individual verbal instruction and demonstration of equipment use and safety with use of the equipment. Flowsheet Row Cardiac Rehab from 10/11/2020 in [facility] Cardiac and Pulmonary Rehab  Date 10/11/20  Educator AS  Instruction Review Code 1- Verbalizes Understanding      Education: Exercise Physiology & General Exercise Guidelines: - Group verbal and written instruction with models to review the exercise physiology of the cardiovascular system and associated critical values. Provides general exercise guidelines with specific guidelines to those with heart or lung disease.    Education: Flexibility, Balance, Mind/Body Relaxation: - Group verbal and visual presentation with interactive activity on the components of exercise prescription. Introduces F.I.T.T principle from ACSM for exercise prescriptions. Reviews F.I.T.T. principles of flexibility and balance exercise training including progression. Also discusses the mind body connection.  Reviews various relaxation techniques to help reduce and manage stress (i.e. Deep breathing, progressive muscle relaxation, and visualization). Balance handout provided to take home. Written material given at graduation.   Activity Barriers & Risk Stratification:  Activity Barriers & Cardiac Risk Stratification - 09/01/20 1538      Activity Barriers & Cardiac Risk Stratification   Activity Barriers Shortness of Breath;Joint  Problems   bad right knee, breathing problems   Cardiac Risk Stratification High           6 Minute Walk:  6 Minute Walk    Row Name 10/11/20 1545         6 Minute Walk   Phase Initial     Distance 265 feet     Walk Time 4 minutes     # of Rest Breaks 2     MPH 0.75     METS 0.5     RPE 15     Perceived Dyspnea  3     VO2 Peak 1.73     Symptoms Yes (comment)     Comments SOB, bak and knee pain 6/10     Resting HR 75 bpm     Resting BP 126/60     Resting Oxygen Saturation  99 %     Exercise Oxygen Saturation  during 6 min walk 100 %     Max Ex. HR 98 bpm     Max Ex. BP 132/66     2 Minute Post BP 124/64            Oxygen Initial Assessment:  Oxygen Initial Assessment - 09/01/20 1541      Home Oxygen   Home Oxygen Device Home Concentrator;E-Tanks    Sleep Oxygen Prescription Continuous    Liters per minute 3    Home Exercise Oxygen Prescription None    Home Resting Oxygen Prescription None    Compliance with Home Oxygen Use Yes      Intervention   Short Term Goals To learn and understand importance of monitoring SPO2 with pulse oximeter and demonstrate accurate use of the pulse oximeter.;To learn and understand importance of maintaining oxygen saturations>88%;To learn and demonstrate proper pursed lip breathing techniques or other breathing techniques.;To learn and demonstrate proper use of respiratory medications    Long  Term Goals Exhibits compliance with exercise, home and travel O2 prescription;Verbalizes importance of monitoring SPO2 with pulse oximeter and return demonstration;Maintenance of O2 saturations>88%;Exhibits proper breathing techniques, such as pursed lip breathing or other method taught during program session;Compliance with respiratory medication;Demonstrates proper use of MDI's  Oxygen Re-Evaluation:   Oxygen Discharge (Final Oxygen Re-Evaluation):   Initial Exercise Prescription:  Initial Exercise Prescription - 10/11/20  1600      Date of Initial Exercise RX and Referring Provider   Date 10/11/20    Referring Provider End      Treadmill   MPH 0.7    Grade 0    Minutes 15      NuStep   Level 1    SPM 80    Minutes 15    METs 1      T5 Nustep   Level 1    SPM 80    Minutes 15    METs 1      Biostep-RELP   Level 1    SPM 50    Minutes 15    METs 1      Prescription Details   Frequency (times per week) 3    Duration Progress to 30 minutes of continuous aerobic without signs/symptoms of physical distress      Intensity   THRR 40-80% of Max Heartrate 106-137    Ratings of Perceived Exertion 11-13    Perceived Dyspnea 0-4      Resistance Training   Training Prescription Yes    Weight 3 lb    Reps 10-15           Perform Capillary Blood Glucose checks as needed.  Exercise Prescription Changes:  Exercise Prescription Changes    Row Name 10/11/20 1600             Response to Exercise   Blood Pressure (Admit) 126/60       Blood Pressure (Exercise) 132/66       Blood Pressure (Exit) 124/64       Heart Rate (Admit) 75 bpm       Heart Rate (Exercise) 98 bpm       Heart Rate (Exit) 95 bpm       Oxygen Saturation (Admit) 99 %       Oxygen Saturation (Exercise) 100 %       Rating of Perceived Exertion (Exercise) 15       Perceived Dyspnea (Exercise) 3       Symptoms back,knee pain              Exercise Comments:   Exercise Goals and Review:  Exercise Goals    Row Name 10/11/20 1606             Exercise Goals   Increase Physical Activity Yes       Intervention Provide advice, education, support and counseling about physical activity/exercise needs.;Develop an individualized exercise prescription for aerobic and resistive training based on initial evaluation findings, risk stratification, comorbidities and participant's personal goals.       Expected Outcomes Short Term: Attend rehab on a regular basis to increase amount of physical activity.;Long Term: Add in home  exercise to make exercise part of routine and to increase amount of physical activity.;Long Term: Exercising regularly at least 3-5 days a week.       Increase Strength and Stamina Yes       Intervention Provide advice, education, support and counseling about physical activity/exercise needs.;Develop an individualized exercise prescription for aerobic and resistive training based on initial evaluation findings, risk stratification, comorbidities and participant's personal goals.       Expected Outcomes Short Term: Increase workloads from initial exercise prescription for resistance, speed, and METs.;Short Term: Perform resistance training exercises routinely during rehab and add  in resistance training at home;Long Term: Improve cardiorespiratory fitness, muscular endurance and strength as measured by increased METs and functional capacity (6MWT)       Able to understand and use rate of perceived exertion (RPE) scale Yes       Intervention Provide education and explanation on how to use RPE scale       Expected Outcomes Short Term: Able to use RPE daily in rehab to express subjective intensity level;Long Term:  Able to use RPE to guide intensity level when exercising independently       Able to understand and use Dyspnea scale Yes       Intervention Provide education and explanation on how to use Dyspnea scale       Expected Outcomes Short Term: Able to use Dyspnea scale daily in rehab to express subjective sense of shortness of breath during exertion;Long Term: Able to use Dyspnea scale to guide intensity level when exercising independently       Knowledge and understanding of Target Heart Rate Range (THRR) Yes       Intervention Provide education and explanation of THRR including how the numbers were predicted and where they are located for reference       Expected Outcomes Short Term: Able to state/look up THRR;Short Term: Able to use daily as guideline for intensity in rehab;Long Term: Able to use  THRR to govern intensity when exercising independently       Able to check pulse independently Yes       Intervention Provide education and demonstration on how to check pulse in carotid and radial arteries.;Review the importance of being able to check your own pulse for safety during independent exercise       Expected Outcomes Short Term: Able to explain why pulse checking is important during independent exercise;Long Term: Able to check pulse independently and accurately       Understanding of Exercise Prescription Yes       Intervention Provide education, explanation, and written materials on patient's individual exercise prescription       Expected Outcomes Short Term: Able to explain program exercise prescription;Long Term: Able to explain home exercise prescription to exercise independently              Exercise Goals Re-Evaluation :   Discharge Exercise Prescription (Final Exercise Prescription Changes):  Exercise Prescription Changes - 10/11/20 1600      Response to Exercise   Blood Pressure (Admit) 126/60    Blood Pressure (Exercise) 132/66    Blood Pressure (Exit) 124/64    Heart Rate (Admit) 75 bpm    Heart Rate (Exercise) 98 bpm    Heart Rate (Exit) 95 bpm    Oxygen Saturation (Admit) 99 %    Oxygen Saturation (Exercise) 100 %    Rating of Perceived Exertion (Exercise) 15    Perceived Dyspnea (Exercise) 3    Symptoms back,knee pain           Nutrition:  Target Goals: Understanding of nutrition guidelines, daily intake of sodium 1500mg , cholesterol 200mg , calories 30% from fat and 7% or less from saturated fats, daily to have 5 or more servings of fruits and vegetables.  Education: All About Nutrition: -Group instruction provided by verbal, written material, interactive activities, discussions, models, and posters to present general guidelines for heart healthy nutrition including fat, fiber, MyPlate, the role of sodium in heart healthy nutrition, utilization of  the nutrition label, and utilization of this knowledge for meal planning. Follow up  email sent as well. Written material given at graduation.   Biometrics:  Pre Biometrics - 10/11/20 1611      Pre Biometrics   Height 5\' 2"  (1.575 m)    Weight 216 lb 6.4 oz (98.2 kg)    BMI (Calculated) 39.57            Nutrition Therapy Plan and Nutrition Goals:   Nutrition Assessments:  MEDIFICTS Score Key:  ?70 Need to make dietary changes   40-70 Heart Healthy Diet  ? 40 Therapeutic Level Cholesterol Diet  Flowsheet Row Cardiac Rehab from 10/11/2020 in [facility] Cardiac and Pulmonary Rehab  Picture Your Plate Total Score on Admission 51     Picture Your Plate Scores:  <22 Unhealthy dietary pattern with much room for improvement.  41-50 Dietary pattern unlikely to meet recommendations for good health and room for improvement.  51-60 More healthful dietary pattern, with some room for improvement.   >60 Healthy dietary pattern, although there may be some specific behaviors that could be improved.    Nutrition Goals Re-Evaluation:   Nutrition Goals Discharge (Final Nutrition Goals Re-Evaluation):   Psychosocial: Target Goals: Acknowledge presence or absence of significant depression and/or stress, maximize coping skills, provide positive support system. Participant is able to verbalize types and ability to use techniques and skills needed for reducing stress and depression.   Education: Stress, Anxiety, and Depression - Group verbal and visual presentation to define topics covered.  Reviews how body is impacted by stress, anxiety, and depression.  Also discusses healthy ways to reduce stress and to treat/manage anxiety and depression.  Written material given at graduation.   Education: Sleep Hygiene -Provides group verbal and written instruction about how sleep can affect your health.  Define sleep hygiene, discuss sleep cycles and impact of sleep habits. Review good sleep hygiene  tips.    Initial Review & Psychosocial Screening:  Initial Psych Review & Screening - 09/01/20 1542      Initial Review   Current issues with Current Psychotropic Meds   Bipolar  on meds controlling well.     Family Dynamics   Good Support System? Yes   Husband of 33 years, twin sons live in same neighborhood     Barriers   Psychosocial barriers to participate in program There are no identifiable barriers or psychosocial needs.;The patient should benefit from training in stress management and relaxation.;Psychosocial barriers identified (see note)      Screening Interventions   Interventions To provide support and resources with identified psychosocial needs;Provide feedback about the scores to participant    Expected Outcomes Short Term goal: Utilizing psychosocial counselor, staff and physician to assist with identification of specific Stressors or current issues interfering with healing process. Setting desired goal for each stressor or current issue identified.;Long Term Goal: Stressors or current issues are controlled or eliminated.;Short Term goal: Identification and review with participant of any Quality of Life or Depression concerns found by scoring the questionnaire.;Long Term goal: The participant improves quality of Life and PHQ9 Scores as seen by post scores and/or verbalization of changes           Quality of Life Scores:   Quality of Life - 10/11/20 1612      Quality of Life   Select Quality of Life      Quality of Life Scores   Health/Function Pre 18.93 %    Socioeconomic Pre 18.44 %    Psych/Spiritual Pre 30 %    Family Pre 24 %  GLOBAL Pre 21.76 %          Scores of 19 and below usually indicate a poorer quality of life in these areas.  A difference of  2-3 points is a clinically meaningful difference.  A difference of 2-3 points in the total score of the Quality of Life Index has been associated with significant improvement in overall quality of life,  self-image, physical symptoms, and general health in studies assessing change in quality of life.  PHQ-9: Recent Review Flowsheet Data    Depression screen [facility] 2/9 10/11/2020 05/26/2020 05/26/2020 04/24/2019 04/15/2018   Decreased Interest 1 0 0 0 0   Down, Depressed, Hopeless 0 0 0 0 0   PHQ - 2 Score 1 0 0 0 0   Altered sleeping 1 0 - - 0   Tired, decreased energy 1 0 - - 0   Change in appetite 2 0 - - 0   Feeling bad or failure about yourself  0 0 - - 0   Trouble concentrating 0 0 - - 0   Moving slowly or fidgety/restless 0 0 - - 0   Suicidal thoughts 0 0 - - 0   PHQ-9 Score 5 0 - - 0   Difficult doing work/chores Not difficult at all - - - Not difficult at all     Interpretation of Total Score  Total Score Depression Severity:  1-4 = Minimal depression, 5-9 = Mild depression, 10-14 = Moderate depression, 15-19 = Moderately severe depression, 20-27 = Severe depression   Psychosocial Evaluation and Intervention:  Psychosocial Evaluation - 09/01/20 1556      Psychosocial Evaluation & Interventions   Comments Sameerah has no barriers to attending the program. She lives in her home with her husband of 16 years. She is ready to get started and see if she can improve her breathing and her stamina. She does have a bad right knee and will need to be careful not to aggravate it during her exercise. She does use a walker and a cane at times.  Her husband is her support and she has twin sons that are very helpful, that live in the same neighborhood. She is ready to start the program.    Expected Outcomes STG: Latiffany is able to attend all scheduled sessions LTG: Emmani is able to maintain all she has learned in the program    Continue Psychosocial Services  Follow up required by staff           Psychosocial Re-Evaluation:   Psychosocial Discharge (Final Psychosocial Re-Evaluation):   Vocational Rehabilitation: Provide vocational rehab assistance to qualifying candidates.   Vocational Rehab  Evaluation & Intervention:  Vocational Rehab - 09/01/20 1546      Initial Vocational Rehab Evaluation & Intervention   Assessment shows need for Vocational Rehabilitation No           Education: Education Goals: Education classes will be provided on a variety of topics geared toward better understanding of heart health and risk factor modification. Participant will state understanding/return demonstration of topics presented as noted by education test scores.  Learning Barriers/Preferences:  Learning Barriers/Preferences - 09/01/20 1546      Learning Barriers/Preferences   Learning Barriers None    Learning Preferences None           General Cardiac Education Topics:  AED/CPR: - Group verbal and written instruction with the use of models to demonstrate the basic use of the AED with the basic ABC's of resuscitation.  Anatomy and Cardiac Procedures: - Group verbal and visual presentation and models provide information about basic cardiac anatomy and function. Reviews the testing methods done to diagnose heart disease and the outcomes of the test results. Describes the treatment choices: Medical Management, Angioplasty, or Coronary Bypass Surgery for treating various heart conditions including Myocardial Infarction, Angina, Valve Disease, and Cardiac Arrhythmias.  Written material given at graduation.   Medication Safety: - Group verbal and visual instruction to review commonly prescribed medications for heart and lung disease. Reviews the medication, class of the drug, and side effects. Includes the steps to properly store meds and maintain the prescription regimen.  Written material given at graduation.   Intimacy: - Group verbal instruction through game format to discuss how heart and lung disease can affect sexual intimacy. Written material given at graduation..   Know Your Numbers and Heart Failure: - Group verbal and visual instruction to discuss disease risk factors  for cardiac and pulmonary disease and treatment options.  Reviews associated critical values for Overweight/Obesity, Hypertension, Cholesterol, and Diabetes.  Discusses basics of heart failure: signs/symptoms and treatments.  Introduces Heart Failure Zone chart for action plan for heart failure.  Written material given at graduation.   Infection Prevention: - Provides verbal and written material to individual with discussion of infection control including proper hand washing and proper equipment cleaning during exercise session. Flowsheet Row Cardiac Rehab from 10/11/2020 in [facility] Cardiac and Pulmonary Rehab  Date 10/11/20  Educator AS  Instruction Review Code 1- Verbalizes Understanding      Falls Prevention: - Provides verbal and written material to individual with discussion of falls prevention and safety. Flowsheet Row Cardiac Rehab from 10/11/2020 in [facility] [facility]  Date 10/11/20  Educator AS  Instruction Review Code 1- Verbalizes Understanding      Other: -Provides group and verbal instruction on various topics (see comments)   Knowledge Questionnaire Score:  Knowledge Questionnaire Score - 10/11/20 1612      Knowledge Questionnaire Score   Pre Score 21/26 exercise/nutrition/heart           Core Components/Risk Factors/Patient Goals at Admission:  Personal Goals and Risk Factors at Admission - 10/11/20 1618      Core Components/Risk Factors/Patient Goals on Admission    Weight Management Yes    Intervention Weight Management: Develop a combined nutrition and exercise program designed to reach desired caloric intake, while maintaining appropriate intake of nutrient and fiber, sodium and fats, and appropriate energy expenditure required for the weight goal.;Weight Management: Provide education and appropriate resources to help participant work on and attain dietary goals.;Weight Management/Obesity: Establish reasonable short term and long term weight  goals.;Obesity: Provide education and appropriate resources to help participant work on and attain dietary goals.    Admit Weight 216 lb 6.4 oz (98.2 kg)    Goal Weight: Short Term 210 lb (95.3 kg)    Goal Weight: Long Term 175 lb (79.4 kg)    Expected Outcomes Short Term: Continue to assess and modify interventions until short term weight is achieved;Long Term: Adherence to nutrition and physical activity/exercise program aimed toward attainment of established weight goal;Weight Loss: Understanding of general recommendations for a balanced deficit meal plan, which promotes 1-2 lb weight loss per week and includes a negative energy balance of [phone] kcal/d    Diabetes Yes    Intervention Provide education about signs/symptoms and action to take for hypo/hyperglycemia.;Provide education about proper nutrition, including hydration, and aerobic/resistive exercise prescription along with prescribed medications  to achieve blood glucose in normal ranges: Fasting glucose 65-99 mg/dL    Expected Outcomes Short Term: Participant verbalizes understanding of the signs/symptoms and immediate care of hyper/hypoglycemia, proper foot care and importance of medication, aerobic/resistive exercise and nutrition plan for blood glucose control.;Long Term: Attainment of HbA1C < 7%.    Intervention Provide education on lifestyle modifcations including regular physical activity/exercise, weight management, moderate sodium restriction and increased consumption of fresh fruit, vegetables, and low fat dairy, alcohol moderation, and smoking cessation.;Monitor prescription use compliance.    Expected Outcomes Short Term: Continued assessment and intervention until BP is < 140/21mm HG in hypertensive participants. < 130/76mm HG in hypertensive participants with diabetes, heart failure or chronic kidney disease.;Long Term: Maintenance of blood pressure at goal levels.    Intervention Provide education and support for participant on  nutrition & aerobic/resistive exercise along with prescribed medications to achieve LDL 70mg , HDL >40mg .    Expected Outcomes Short Term: Participant states understanding of desired cholesterol values and is compliant with medications prescribed. Participant is following exercise prescription and nutrition guidelines.;Long Term: Cholesterol controlled with medications as prescribed, with individualized exercise RX and with personalized nutrition plan. Value goals: LDL < 70mg , HDL > 40 mg.           Education:Diabetes - Individual verbal and written instruction to review signs/symptoms of diabetes, desired ranges of glucose level fasting, after meals and with exercise. Acknowledge that pre and post exercise glucose checks will be done for 3 sessions at entry of program.   Core Components/Risk Factors/Patient Goals Review:    Core Components/Risk Factors/Patient Goals at Discharge (Final Review):    ITP Comments:  ITP Comments    Row Name 09/01/20 1554 10/11/20 1622 10/13/20 0651       ITP Comments Virtual orientation call completed today. shehas an appointment on Date: 09/23/2020 for EP eval and gym Orientation.  Documentation of diagnosis can be found [facility] Date: 08/03/2021. Completed 6MWT and gym orientation. Initial ITP created and sent for review to Dr. Emily Filbert, Medical Director. 30 Day review completed. Medical Director ITP review done, changes made as directed, and signed approval by Medical Director.            Comments:

## 2020-10-14 ENCOUNTER — Encounter: Payer: Self-pay | Admitting: Surgery

## 2020-10-14 ENCOUNTER — Ambulatory Visit (INDEPENDENT_AMBULATORY_CARE_PROVIDER_SITE_OTHER): Payer: Self-pay | Admitting: Surgery

## 2020-10-14 ENCOUNTER — Other Ambulatory Visit: Payer: Self-pay

## 2020-10-14 VITALS — BP 127/63 | HR 71 | Resp 20 | Ht 62.0 in | Wt 218.0 lb

## 2020-10-14 DIAGNOSIS — I2510 Atherosclerotic heart disease of native coronary artery without angina pectoris: Secondary | ICD-10-CM

## 2020-10-14 DIAGNOSIS — Z951 Presence of aortocoronary bypass graft: Secondary | ICD-10-CM

## 2020-10-15 ENCOUNTER — Encounter: Payer: Self-pay | Admitting: Surgery

## 2020-10-15 NOTE — Progress Notes (Signed)
HPI:  The patient returns today for follow-up status post coronary bypass graft surgery x1 using a left internal mammary artery graft to the LAD on 08/03/2020.  I last saw her on 09/01/2020.  She said that she has been making progress and her only complaint is that she has had continued nausea.  She had a CT of the abdomen and pelvis on 10/07/2020 which showed no acute intra-abdominal abnormality. 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  She is planning on attending cardiac rehab.  She denies any chest pain or pressure.  She has had no shortness of breath.  Current Outpatient Medications  Medication Sig Dispense Refill  . albuterol (VENTOLIN HFA) 108 (90 Base) MCG/ACT inhaler Inhale 2 puffs into the lungs every 6 (six) hours as needed for wheezing or shortness of breath. 8 g 3  . Apple Cider Vinegar 600 MG CAPS     . aspirin 81 MG EC tablet Take 1 tablet (81 mg total) by mouth daily.    . [place]wellia-Glucosamine-Vit D (OSTEO BI-FLEX ONE PER DAY PO) Take 1 tablet by mouth daily.    . butalbital-acetaminophen-caffeine (FIORICET) 50-325-40 MG tablet Take 1 tablet by mouth 2 (two) times daily as needed for headache or migraine. 20 tablet 1  . famotidine (PEPCID) 20 MG tablet Take 20 mg by mouth every other day. Alternates with omeprazole    . INS SYRINGE/NEEDLE 1CC/28G (B-D INS SYR MICROFINE 1CC/28G) 28G X 1/2" 1 ML MISC USE AS DIRECTED 100 each 3  . insulin NPH Human (NOVOLIN N) 100 UNIT/ML injection Inject 0.2 mLs (20 Units total) into the skin daily after supper.    . levothyroxine (SYNTHROID) 100 MCG tablet TAKE 1 TABLET DAILY, AND ON2 DAYS A WEEK, TAKE AN     EXTRA 1/2 TABLET 90 tablet 3  . loratadine (CLARITIN) 10 MG tablet Take 1 tablet (10 mg total) by mouth daily.    . metFORMIN (GLUCOPHAGE) 500 MG tablet Take 500 mg by mouth 2 (two) times daily with a meal.    . Multiple Vitamin (MULTIVITAMIN WITH MINERALS) TABS tablet Take 1 tablet by mouth  daily. One A Day for Women    . nystatin cream (MYCOSTATIN) APPLY TO AFFECTED AREA TWICE A DAY 30 g 1  . omeprazole (PRILOSEC) 20 MG capsule TAKE 1 DAILY-ALTERNATES WITH FAMOTIDINE 90 capsule 1  . ONE TOUCH LANCETS MISC Use to check sugar twice daily Dx: E11.8 200 each 3  . ONETOUCH ULTRA test strip USE TO CHECK BLOOD SUGAR 2 TIMES   DAILY 200 strip 3  . OXYGEN Inhale 2 L into the lungs at bedtime.    . pioglitazone (ACTOS) 15 MG tablet TAKE 1 TABLET DAILY 90 tablet 0  . Rhubarb (ESTROVEN COMPLETE PO) Take 1 tablet by mouth daily.    . simvastatin (ZOCOR) 40 MG tablet TAKE 1 TABLET AT BEDTIME 90 tablet 3  . topiramate (TOPAMAX) 100 MG tablet Take 1 tablet (100 mg total) by mouth at bedtime. 90 tablet 1  . umeclidinium-vilanterol (ANORO ELLIPTA) 62.5-25 MCG/INH AEPB Inhale 1 puff into the lungs daily. 60 each 5  . ziprasidone (GEODON) 40 MG capsule Take 1 capsule (40 mg total) by mouth in the morning and at bedtime.     No current facility-administered medications for this visit.     Physical Exam: BP 127/63 (BP Location: Left Arm, Cuff Size: Large)   Pulse 71   Resp 20   Ht 5'  2" (1.575 m)   Wt 218 lb (98.9 kg)   SpO2 100% Comment: RA  BMI 39.87 kg/m  She looks well. Cardiac exam shows a regular rate and rhythm with normal heart sounds. Lungs are clear. The chest incision is healing well and the sternum is stable. There is no peripheral edema.  Diagnostic Tests:  None today  Impression:  She is now about 2-1/59-month out from coronary bypass graft surgery.  She is making good progress but continues to complain of some nausea made worse by certain foods.  She has seen a gastroenterologist in the past and is planning to make an appointment with that physician for follow-up.  There is nothing remarkable on her CT of the abdomen pelvis but she could have some gastritis or an ulcer.  I encouraged her to start cardiac rehab.  She knows not to lift anything heavier than 10 pounds for 3  months postoperatively.  Plan:  She is going to continue to follow-up with her PCP and cardiology and will return to see me if she has any problems with her incision.  She is going to schedule an appointment with her gastroenterologist in the near future.   Gaye Pollack, MD [facility] [phone]

## 2020-10-18 ENCOUNTER — Other Ambulatory Visit: Payer: Self-pay

## 2020-10-18 DIAGNOSIS — Z951 Presence of aortocoronary bypass graft: Secondary | ICD-10-CM | POA: Diagnosis not present

## 2020-10-18 LAB — GLUCOSE, CAPILLARY
Glucose-Capillary: 111 mg/dL — ABNORMAL HIGH (ref 70–99)
Glucose-Capillary: 124 mg/dL — ABNORMAL HIGH (ref 70–99)

## 2020-10-18 NOTE — Progress Notes (Signed)
Daily Session Note  Patient Details  Name: [name] MRN: [id number] Date of Birth: January 25, 1951 Referring Provider:   Flowsheet Row Cardiac Rehab from 10/11/2020 in [facility]  Referring Provider End      Encounter Date: 10/18/2020  Check In:  Session Check In - 10/18/20 1347      Check-In   Supervising physician immediately available to respond to emergencies See telemetry face sheet for immediately available ER MD    Location [facility]    Staff Present Birdie Sons, MPA, Mauricia Area, BS, ACSM CEP, Exercise Physiologist;Kara Eliezer Bottom, MS Exercise Physiologist    Virtual Visit No    Medication changes reported     No    Fall or balance concerns reported    No    Warm-up and Cool-down Performed on first and last piece of equipment    Resistance Training Performed Yes    VAD Patient? No    PAD/SET Patient? No      Pain Assessment   Currently in Pain? No/denies              Social History   Tobacco Use  Smoking Status Former Smoker  . Packs/day: 2.00  . Years: 43.00  . Pack years: 86.00  . Types: Cigarettes  . Quit date: 05/17/2010  . Years since quitting: 10.4  Smokeless Tobacco Never Used  Tobacco Comment   QUIT 2011    Goals Met:  Independence with exercise equipment Exercise tolerated well No report of cardiac concerns or symptoms Strength training completed today  Goals Unmet:  Not Applicable  Comments: First full day of exercise!  Patient was oriented to gym and equipment including functions, settings, policies, and procedures.  Patient's individual exercise prescription and treatment plan were reviewed.  All starting workloads were established based on the results of the 6 minute walk test done at initial orientation visit.  The plan for exercise progression was also introduced and progression will be customized based on patient's performance and goals.    Dr. Emily Filbert is Medical Director for  [facility].

## 2020-10-20 ENCOUNTER — Telehealth: Payer: Self-pay | Admitting: *Deleted

## 2020-10-20 ENCOUNTER — Encounter: Payer: Self-pay | Admitting: *Deleted

## 2020-10-20 ENCOUNTER — Other Ambulatory Visit: Payer: Self-pay

## 2020-10-20 ENCOUNTER — Encounter: Payer: Medicare HMO | Attending: Internal Medicine

## 2020-10-20 DIAGNOSIS — Z951 Presence of aortocoronary bypass graft: Secondary | ICD-10-CM

## 2020-10-20 NOTE — Progress Notes (Signed)
Completed initial RD evaluation 

## 2020-10-20 NOTE — Telephone Encounter (Signed)
Rie called to cancel.  She is very sore and in pain since Monday.  Her knees are really bothering her and she can barely walk.  She has been taking tramadol for them. She got set up for an appt with [facility] on Monday at 11am to see if they can help.  She wanted to stop altogether but we talked about why she was hurting;   DOMs, new to exercise and ways to help combat with ice.  We decided to hold off on the rest of the week to allow her to recover and ice.  She is going to call us after her appointment on Monday to see what they say at office.  We talked about slowing down some when she returns and taking more rest breaks.

## 2020-10-22 ENCOUNTER — Other Ambulatory Visit: Payer: Self-pay | Admitting: Family Medicine

## 2020-10-25 ENCOUNTER — Telehealth: Payer: Self-pay

## 2020-10-25 DIAGNOSIS — S86911A Strain of unspecified muscle(s) and tendon(s) at lower leg level, right leg, initial encounter: Secondary | ICD-10-CM | POA: Diagnosis not present

## 2020-10-25 DIAGNOSIS — M1711 Unilateral primary osteoarthritis, right knee: Secondary | ICD-10-CM | POA: Diagnosis not present

## 2020-10-25 DIAGNOSIS — Z951 Presence of aortocoronary bypass graft: Secondary | ICD-10-CM

## 2020-10-25 NOTE — Telephone Encounter (Signed)
Patient called, she is currently receiving cortisone injections for her knee and is planning to start OP PT for it in a couple of weeks. Patient does not prefer to complete both rehab and PT at the same time. She is going to call [facility] tomorrow to find out how long she will need the PT for and depending on the time frame, we will either place her on a medical hold for Cardiac Rehab or discharge her at this time and get a new referral from her doctor when she is completed with her PT. Patient agreed with plan above.

## 2020-10-26 ENCOUNTER — Encounter: Payer: Self-pay | Admitting: Family Medicine

## 2020-10-27 ENCOUNTER — Telehealth: Payer: Self-pay | Admitting: Cardiology

## 2020-10-27 ENCOUNTER — Other Ambulatory Visit: Payer: Self-pay

## 2020-10-27 ENCOUNTER — Emergency Department
Admission: EM | Admit: 2020-10-27 | Discharge: 2020-10-27 | Disposition: A | Discharge status: Home / Self Care | Payer: Medicare HMO | Attending: Emergency Medicine | Admitting: Emergency Medicine

## 2020-10-27 ENCOUNTER — Encounter: Payer: Self-pay | Admitting: Family Medicine

## 2020-10-27 ENCOUNTER — Encounter: Payer: Self-pay | Admitting: Emergency Medicine

## 2020-10-27 ENCOUNTER — Emergency Department: Payer: Medicare HMO

## 2020-10-27 DIAGNOSIS — E039 Hypothyroidism, unspecified: Secondary | ICD-10-CM | POA: Insufficient documentation

## 2020-10-27 DIAGNOSIS — Z794 Long term (current) use of insulin: Secondary | ICD-10-CM | POA: Diagnosis not present

## 2020-10-27 DIAGNOSIS — I110 Hypertensive heart disease with heart failure: Secondary | ICD-10-CM | POA: Insufficient documentation

## 2020-10-27 DIAGNOSIS — Z7982 Long term (current) use of aspirin: Secondary | ICD-10-CM | POA: Diagnosis not present

## 2020-10-27 DIAGNOSIS — Z87891 Personal history of nicotine dependence: Secondary | ICD-10-CM | POA: Insufficient documentation

## 2020-10-27 DIAGNOSIS — Z951 Presence of aortocoronary bypass graft: Secondary | ICD-10-CM | POA: Insufficient documentation

## 2020-10-27 DIAGNOSIS — Z7984 Long term (current) use of oral hypoglycemic drugs: Secondary | ICD-10-CM | POA: Insufficient documentation

## 2020-10-27 DIAGNOSIS — R079 Chest pain, unspecified: Secondary | ICD-10-CM | POA: Diagnosis not present

## 2020-10-27 DIAGNOSIS — J449 Chronic obstructive pulmonary disease, unspecified: Secondary | ICD-10-CM | POA: Insufficient documentation

## 2020-10-27 DIAGNOSIS — Z7951 Long term (current) use of inhaled steroids: Secondary | ICD-10-CM | POA: Insufficient documentation

## 2020-10-27 DIAGNOSIS — I517 Cardiomegaly: Secondary | ICD-10-CM | POA: Diagnosis not present

## 2020-10-27 DIAGNOSIS — Z96652 Presence of left artificial knee joint: Secondary | ICD-10-CM | POA: Diagnosis not present

## 2020-10-27 DIAGNOSIS — R0602 Shortness of breath: Secondary | ICD-10-CM | POA: Diagnosis not present

## 2020-10-27 DIAGNOSIS — R0789 Other chest pain: Secondary | ICD-10-CM | POA: Insufficient documentation

## 2020-10-27 DIAGNOSIS — I509 Heart failure, unspecified: Secondary | ICD-10-CM | POA: Diagnosis not present

## 2020-10-27 DIAGNOSIS — Z79899 Other long term (current) drug therapy: Secondary | ICD-10-CM | POA: Insufficient documentation

## 2020-10-27 DIAGNOSIS — I2510 Atherosclerotic heart disease of native coronary artery without angina pectoris: Secondary | ICD-10-CM | POA: Diagnosis not present

## 2020-10-27 DIAGNOSIS — E1169 Type 2 diabetes mellitus with other specified complication: Secondary | ICD-10-CM | POA: Insufficient documentation

## 2020-10-27 LAB — CBC
HCT: 33.5 % — ABNORMAL LOW (ref 36.0–46.0)
Hemoglobin: 10.8 g/dL — ABNORMAL LOW (ref 12.0–15.0)
MCH: 29.3 pg (ref 26.0–34.0)
MCHC: 32.2 g/dL (ref 30.0–36.0)
MCV: 91 fL (ref 80.0–100.0)
Platelets: 400 10*3/uL (ref 150–400)
RBC: 3.68 MIL/uL — ABNORMAL LOW (ref 3.87–5.11)
RDW: 16.4 % — ABNORMAL HIGH (ref 11.5–15.5)
WBC: 17.9 10*3/uL — ABNORMAL HIGH (ref 4.0–10.5)
nRBC: 0 % (ref 0.0–0.2)

## 2020-10-27 LAB — BASIC METABOLIC PANEL
Anion gap: 7 (ref 5–15)
BUN: 17 mg/dL (ref 8–23)
CO2: 21 mmol/L — ABNORMAL LOW (ref 22–32)
Calcium: 9.2 mg/dL (ref 8.9–10.3)
Chloride: 111 mmol/L (ref 98–111)
Creatinine, Ser: 0.66 mg/dL (ref 0.44–1.00)
GFR, Estimated: >60 mL/min (ref >60)
Glucose, Bld: 133 mg/dL — ABNORMAL HIGH (ref 70–99)
Potassium: 3.9 mmol/L (ref 3.5–5.1)
Sodium: 139 mmol/L (ref 135–145)

## 2020-10-27 LAB — TROPONIN I (HIGH SENSITIVITY)
Troponin I (High Sensitivity): 5 ng/L (ref <18)
Troponin I (High Sensitivity): 6 ng/L (ref <18)

## 2020-10-27 MED ORDER — ISOSORBIDE MONONITRATE ER 30 MG PO TB24: 15.0000 mg | ORAL_TABLET | Freq: Every day | ORAL | 1 refills | Status: DC

## 2020-10-27 NOTE — ED Notes (Signed)
Pt to ED c/o CP that started lst night. "happened 3 times last night" and "3 times this morning" and was sharp, on L chest, non-radiating. Pain was 8/10 last night and was 6/10 this morning and pt felt SOB and states was diaphoretic but pain began resolving about 1030 this morning. Pt states pain level is 0/10 currently. Pt in NAD, husband at bedside. Pt had CABG surgery on 08/03/20. No stents.

## 2020-10-27 NOTE — ED Provider Notes (Signed)
[facility] Emergency Department Provider Note   ____________________________________________   Event Date/Time   First MD Initiated Contact with Patient 10/27/20 1055     (approximate)  I have reviewed the triage vital signs and the nursing notes.   HISTORY  Chief Complaint Chest Pain    HPI [name] is a 70 y.o. female with past medical history of hypertension, hyperlipidemia, CAD status post CABG, diabetes, COPD, and OSA who presents to the ED complaining of chest pain.  Patient reports that she has had 2 separate episodes of pain in her chest since last night.  She describes the pain as sharp and has come on each time while she was at rest.  The pain has lasted for about 15 or 20 minutes each time and resolves with rest.  She has had some mild difficulty breathing with these episodes but denies any nausea, vomiting, fever or cough.  She has not noticed any pain or swelling in her legs.  She underwent one-vessel CABG of her LAD last December, states she has been feeling well since then with no cardiac issues.  She is chest pain-free at the time of her arrival to the ED.        Past Medical History:  Diagnosis Date  . Anxiety   . Arthritis    per patient "all over body"  . Bipolar disorder ([place])    has stopped seeing psychiatrist  . CAD (coronary artery disease)    a. 06/2020 MV: small, sev, apical inf/apical defect-scar vs ischemia. EF 45%; b. 06/2020 Cath: LM 70m, LAD 70ost, 13m, LCX nl, RCA nl; c. 07/2020 CABG x 1: LIMA->LAD.  Marland Kitchen Cataract   . Chronic bronchitis   . COPD (chronic obstructive pulmonary disease) ([place])   . Coronary artery disease, non-occlusive   . Depression   . Dyspnea 2012   s/p pulm/cards w/u WNL, thought due to obesity/deconditioning  . Fibromyalgia   . GERD (gastroesophageal reflux disease)   . HFmrEF (heart failure with mid-range ejection fraction) ([place])    a. 05/2020 Echo: EF 40%, glob HK, mild LVH, Gr1 DD; b.  06/2020 EF by SPECT: 45%; c. 07/2020 intraop TEE: EF 50%.  . History of chicken pox   . HLD (hyperlipidemia)   . Hypertension   . Hypothyroidism   . Ischemic cardiomyopathy    a. 05/2020 Echo: EF 40%; b. 06/2020 EF by SPECT: 45%; c. 07/2020 intraop TEE: EF 50%.  . Migraine   . OSA (obstructive sleep apnea) 03/16/2011  . Pneumonia   . Sleep apnea    per patient cannot tolerate CPAP  . T2DM (type 2 diabetes mellitus) ([place]) 2012   DM education 06/2011  . Urinary incontinence     Patient Active Problem List   Diagnosis Date Noted  . Abdominal aortic ectasia ([place]) 10/09/2020  . Back pain 09/27/2020  . Gross hematuria 09/14/2020  . S/P CABG x 1 08/03/2020  . Abnormal cardiovascular stress test   . Aortic atherosclerosis ([place]) 04/15/2020  . COPD (chronic obstructive pulmonary disease) ([place]) 04/15/2020  . Pill dysphagia 04/05/2020  . Cervical neck pain with evidence of disc disease 10/23/2019  . Left hip pain 10/23/2019  . Right knee pain 10/23/2019  . Osteoarthritis 10/23/2019  . Anemia 10/23/2019  . Weight loss, unintentional 10/05/2019  . Hepatic steatosis 05/03/2019  . Thrombocytosis 04/24/2019  . Abdominal pain 04/24/2019  . Migraine 04/19/2018  . Severe obesity (BMI 35.0-39.9) with comorbidity ([place]) 08/17/2017  . Palpitations 01/08/2017  .  Mass of right side of neck 07/10/2016  . Neutrophilia 04/30/2015  . Advanced care planning/counseling discussion 01/22/2015  . Health maintenance examination 01/22/2015  . Medicare annual wellness visit, subsequent 12/15/2013  . Tachycardia 09/13/2012  . Osteoarthritis of left knee 12/11/2011  . [place] intertrigo 05/10/2011  . Hypertension   . Bipolar disorder ([place])   . Fibromyalgia   . Hyperlipidemia associated with type 2 diabetes mellitus ([place])   . GERD (gastroesophageal reflux disease)   . Hypothyroidism 04/05/2011  . OSA on CPAP 03/16/2011  . CAD (coronary artery disease) 03/16/2011  . Diabetes mellitus type 2, controlled,  with complications ([place]) 39/76/7341  . Dyspnea 01/03/2011    Past Surgical History:  Procedure Laterality Date  . BREAST CYST ASPIRATION Right 2003  . cardiac catherization  03/2011   x3 [facility]), mild nonobstructive CAD  . COLONOSCOPY  12/2011   hyperplastic polyp x2,. diverticulosis, rec rpt 10 yrs  . CORONARY ARTERY BYPASS GRAFT N/A 08/03/2020   Procedure: CORONARY ARTERY BYPASS GRAFTING TIMES ONE USING LEFT INTERNAL MAMMARY ARTERY.;  Surgeon: Gaye Pollack, MD;  Location: [facility];  Service: Open Heart Surgery;  Laterality: N/A;  . ESOPHAGOGASTRODUODENOSCOPY  2006  . ESOPHAGOGASTRODUODENOSCOPY  05/2020   no esophageal abnormality to explain dysphagia - esophagus dilated. Erosive gastropathy - neg H pylori [place])  . KNEE SURGERY  2006   left, torn meniscus  . RIGHT/LEFT HEART CATH AND CORONARY ANGIOGRAPHY N/A 07/13/2020   Procedure: RIGHT/LEFT HEART CATH AND CORONARY ANGIOGRAPHY;  Surgeon: Nelva Bush, MD;  Location: [facility];  Service: Cardiovascular;  Laterality: N/A;  . TEE WITHOUT CARDIOVERSION N/A 08/03/2020   Procedure: TRANSESOPHAGEAL ECHOCARDIOGRAM (TEE);  Surgeon: Gaye Pollack, MD;  Location: [facility];  Service: Open Heart Surgery;  Laterality: N/A;  . TOTAL ABDOMINAL HYSTERECTOMY  2004   fibroids, cervix remained  . TOTAL KNEE ARTHROPLASTY Left 08/2012   [name], [place] [facility]    Prior to Admission medications   Medication Sig Start Date End Date Taking? Authorizing Provider  isosorbide mononitrate (IMDUR) 30 MG 24 hr tablet Take 0.5 tablets (15 mg total) by mouth daily. 10/27/20 12/26/20 Yes Blake Divine, MD  albuterol (VENTOLIN HFA) 108 (90 Base) MCG/ACT inhaler Inhale 2 puffs into the lungs every 6 (six) hours as needed for wheezing or shortness of breath. 06/07/20   Tyler Pita, MD  Apple Cider Vinegar 600 MG CAPS  07/08/20   [provider]  aspirin 81 MG EC tablet Take 1 tablet (81 mg total) by mouth daily. 10/08/20   Kate Sable, MD  Boswellia-Glucosamine-Vit D (OSTEO BI-FLEX ONE PER DAY PO) Take 1 tablet by mouth daily.    [provider]  butalbital-acetaminophen-caffeine (FIORICET) 50-325-40 MG tablet Take 1 tablet by mouth 2 (two) times daily as needed for headache or migraine. 05/26/20 05/26/21  Ria Bush, MD  famotidine (PEPCID) 20 MG tablet Take 20 mg by mouth every other day. Alternates with omeprazole    [provider]  INS SYRINGE/NEEDLE 1CC/28G (B-D INS SYR MICROFINE 1CC/28G) 28G X 1/2" 1 ML MISC USE AS DIRECTED 12/15/19   Ria Bush, MD  insulin NPH Human (NOVOLIN N) 100 UNIT/ML injection Inject 0.2 mLs (20 Units total) into the skin daily after supper. 10/06/20   Ria Bush, MD  levothyroxine (SYNTHROID) 100 MCG tablet TAKE 1 TABLET DAILY, AND ON2 DAYS A WEEK, TAKE AN     EXTRA 1/2 TABLET 08/02/20   Ria Bush, MD  loratadine (CLARITIN) 10 MG tablet Take 1  tablet (10 mg total) by mouth daily. 03/22/20   Ria Bush, MD  metFORMIN (GLUCOPHAGE) 500 MG tablet Take 500 mg by mouth 2 (two) times daily with a meal.    [provider]  Multiple Vitamin (MULTIVITAMIN WITH MINERALS) TABS tablet Take 1 tablet by mouth daily. One A Day for Women    [provider]  nystatin cream (MYCOSTATIN) APPLY TO AFFECTED AREA TWICE A DAY 08/26/20   Theora Gianotti, NP  omeprazole (PRILOSEC) 20 MG capsule TAKE 1 DAILY-ALTERNATES WITH FAMOTIDINE 08/16/20   Ria Bush, MD  ONE [facility] LANCETS MISC Use to check sugar twice daily Dx: E11.8 11/15/16   Ria Bush, MD  [facility] ULTRA test strip USE TO CHECK BLOOD SUGAR 2 TIMES   DAILY 08/23/20   Ria Bush, MD  OXYGEN Inhale 2 L into the lungs at bedtime.    [provider]  pioglitazone (ACTOS) 15 MG tablet TAKE 1 TABLET DAILY 07/23/20   Ria Bush, MD  Rhubarb (ESTROVEN COMPLETE PO) Take 1 tablet by mouth daily.    [provider]  simvastatin (ZOCOR) 40 MG tablet TAKE 1  TABLET AT BEDTIME 07/02/20   Ria Bush, MD  topiramate (TOPAMAX) 100 MG tablet Take 1 tablet (100 mg total) by mouth at bedtime. 07/01/20   Ria Bush, MD  umeclidinium-vilanterol [facility] ELLIPTA) 62.5-25 MCG/INH AEPB Inhale 1 puff into the lungs daily. 04/30/20   Tyler Pita, MD  ziprasidone (GEODON) 40 MG capsule Take 1 capsule (40 mg total) by mouth in the morning and at bedtime. 08/09/20   Antony Odea, PA-C    Allergies Crestor [rosuvastatin], Lipitor [atorvastatin], Lithium, and Quetiapine  Family History  Problem Relation Age of Onset  . Asthma Maternal Uncle   . Cancer Maternal Uncle        colon  . Aneurysm Maternal Uncle   . Colon cancer Maternal Uncle 29  . Hypertension Mother   . Stroke Mother   . Arthritis Mother   . COPD Sister   . Acute lymphoblastic leukemia Cousin 3       died at age 10  . Cancer Father 50       brain tumor  . Diabetes Father   . Thyroid disease Father   . Cancer Maternal Aunt 9       breast  . Breast cancer Maternal Aunt   . Cancer Paternal Uncle        prostate  . Coronary artery disease Neg Hx   . Stomach cancer Neg Hx   . Rectal cancer Neg Hx   . Esophageal cancer Neg Hx     Social History Social History   Tobacco Use  . Smoking status: Former Smoker    Packs/day: 2.00    Years: 43.00    Pack years: 86.00    Types: Cigarettes    Quit date: 05/17/2010    Years since quitting: 10.4  . Smokeless tobacco: Never Used  . Tobacco comment: QUIT 2011  Vaping Use  . Vaping Use: Never used  Substance Use Topics  . Alcohol use: No    Alcohol/week: 0.0 standard drinks  . Drug use: No    Review of Systems  Constitutional: No fever/chills Eyes: No visual changes. ENT: No sore throat. Cardiovascular: Positive for chest pain. Respiratory: Positive for shortness of breath. Gastrointestinal: No abdominal pain.  No nausea, no vomiting.  No diarrhea.  No constipation. Genitourinary: Negative for  dysuria. Musculoskeletal: Negative for back pain. Skin: Negative for rash.  Neurological: Negative for headaches, focal weakness or numbness.  ____________________________________________   PHYSICAL EXAM:  VITAL SIGNS: ED Triage Vitals  Enc Vitals Group     BP 10/27/20 0951 (!) 110/54     Pulse Rate 10/27/20 0951 84     Resp 10/27/20 0951 18     Temp 10/27/20 0951 98.2 F (36.8 C)     Temp Source 10/27/20 0951 Oral     SpO2 10/27/20 0951 98 %     Weight 10/27/20 0952 215 lb (97.5 kg)     Height 10/27/20 0952 5\' 2"  (1.575 m)     Head Circumference --      Peak Flow --      Pain Score 10/27/20 0955 8     Pain Loc --      Pain Edu? --      Excl. in [place]? --     Constitutional: Alert and oriented. Eyes: Conjunctivae are normal. Head: Atraumatic. Nose: No congestion/rhinnorhea. Mouth/Throat: Mucous membranes are moist. Neck: Normal ROM Cardiovascular: Normal rate, regular rhythm. Grossly normal heart sounds.  2+ radial pulses bilaterally. Respiratory: Normal respiratory effort.  No retractions. Lungs CTAB.  No chest wall tenderness to palpation. Gastrointestinal: Soft and nontender. No distention. Genitourinary: deferred Musculoskeletal: No lower extremity tenderness nor edema. Neurologic:  Normal speech and language. No gross focal neurologic deficits are appreciated. Skin:  Skin is warm, dry and intact. No rash noted. Psychiatric: Mood and affect are normal. Speech and behavior are normal.  ____________________________________________   LABS (all labs ordered are listed, but only abnormal results are displayed)  Labs Reviewed  BASIC METABOLIC PANEL - Abnormal; Notable for the following components:      Result Value   CO2 21 (*)    Glucose, Bld 133 (*)    All other components within normal limits  CBC - Abnormal; Notable for the following components:   WBC 17.9 (*)    RBC 3.68 (*)    Hemoglobin 10.8 (*)    HCT 33.5 (*)    RDW 16.4 (*)    All other components  within normal limits  TROPONIN I (HIGH SENSITIVITY)  TROPONIN I (HIGH SENSITIVITY)   ____________________________________________  EKG  ED ECG REPORT I, Blake Divine, the attending physician, personally viewed and interpreted this ECG.   Date: 10/27/2020  EKG Time: 9:49  Rate: 84  Rhythm: normal sinus rhythm  Axis: Normal  Intervals:none  ST&T Change: Inferolateral T wave inversions, similar to previous   PROCEDURES  Procedure(s) performed (including Critical Care):  Procedures   ____________________________________________   INITIAL IMPRESSION / ASSESSMENT AND PLAN / ED COURSE       70 year old female with past medical history of hypertension, hyperlipidemia, diabetes, CAD status post one-vessel CABG, COPD, and OSA who presents to the ED complaining of 2 separate episodes of sharp pain in the center of her chest starting last night while at rest.  She is chest pain-free on arrival to the ED, EKG shows T wave changes in inferolateral leads similar to previous.  Initial troponin is negative, chest x-ray reviewed by me and shows no infiltrate, edema, or effusion.  At the time of her cardiac catheterization last November, she had one vessel disease with no significant disease in her her RCA or circumflex.  Given atypical symptoms, I have a low suspicion for ACS at this time and if repeat troponin is negative she would be appropriate for discharge home with cardiology follow-up.  I doubt PE or dissection given her pain has  resolved.  Patient with a couple more brief episodes of pain here in the ED, which he then states are sharp and lasts for about 5 minutes before resolving on their own.  Repeat troponin is negative and patient is again chest pain-free on my reassessment.  Case discussed with Dr. Garen Lah of cardiology, who agrees that with reassuring work-up patient will be appropriate for discharge home and follow-up at scheduled appointment in 2 days.  He recommends starting  patient on low-dose Imdur daily.  Patient was counseled to return to the ED for new worsening symptoms, patient agrees with plan.      ____________________________________________   FINAL CLINICAL IMPRESSION(S) / ED DIAGNOSES  Final diagnoses:  Atypical chest pain     ED Discharge Orders         Ordered    isosorbide mononitrate (IMDUR) 30 MG 24 hr tablet  Daily        10/27/20 1331           Note:  This document was prepared using Dragon voice recognition software and may include unintentional dictation errors.   Blake Divine, MD 10/27/20 [phone]

## 2020-10-27 NOTE — Telephone Encounter (Signed)
Call transferred. Patient started having 8 out of ten chest pain last night. Left side of chest and sharp. Lasting about 5 minutes each time for a total of 6 times. Continuing this morning. Reports BP has been more elevated since discontinuing of BP meds on 2/18. On 2/18- Dr. Garen Lah discontinued lisinopril and metoprolol. 3/8 150/61 3/7 130/60 3/6 141/63 3/5 149/78 3/4 146/63 Has not recorded heart rate. HR while on the phone is around 56.  Per patient, other symptoms include sweating and SOB with talking. 08/03/20- Patient has CABG x1. Patient denies ever having this type of chest pain in the past. Due to the nature that the pain is new to her, advised patient to go to the emergency room for further evaluation. Went ahead and scheduled her an office visit with Dr. Garen Lah in case ER workup is benign. Patient verbalized understanding and has someone to drive her to the ER.  Routing to Dr. Garen Lah for review.

## 2020-10-27 NOTE — ED Notes (Signed)
EDP at bedside  

## 2020-10-27 NOTE — Telephone Encounter (Signed)
Pt c/o of Chest Pain: STAT if CP now or developed within 24 hours  1. Are you having CP right now? yes  2. Are you experiencing any other symptoms (ex. SOB, nausea, vomiting, sweating)? SOB - has all the time though  3. How long have you been experiencing CP? Since last night  4. Is your CP continuous or coming and going? Comes and goes  5. Have you taken Nitroglycerin? no ?

## 2020-10-27 NOTE — ED Notes (Signed)
Pt called nurse in because states that at 1125, for a few seconds, had sharp pain again in L chest, 6/10. Now is 0/10. Informed EDP. Pt continues to display NSR on monitor.

## 2020-10-27 NOTE — Telephone Encounter (Signed)
Reviewing the patient's chart at this time, I see that Dr. Garen Lah responded back with recommendations as stated below earlier this morning:  ----- Message from Kate Sable, MD sent at 10/27/2020 9:15 AM EST -----  Okay to restart Lopressor 12.5 mg twice daily.    However, the patient is currently in the ER for further evaluation.  Will continue to monitor and call the patient back with MD recommendations pending ER workup.

## 2020-10-27 NOTE — ED Triage Notes (Signed)
Pt comes into the ED via POV c/o left side chest pain with no radiation.  Pt admits she has bypass surgery in December.  Pt states she has had diaphoresis and SHOB.  Pt denies any N/V or dizziness.  Pt has even and unlabored respirations at this time. Pt state the pain started last night and she did take 81mg  ASA.

## 2020-10-27 NOTE — ED Notes (Signed)
Pt states that had another "1 minute" episode of sharp CP on L side that has since resolved. Pt in NAD at this time, denies pain, VS and cardiac rhythm are WNL.

## 2020-10-28 NOTE — Telephone Encounter (Signed)
Patient left emergency room yesterday with imdur 15 mg by mouth once a day. Patient scheduled to see Dr. Garen Lah. Will route to him to let him know update on patient's medications for further advice on metoprolol at appointment tomorrow.

## 2020-10-29 ENCOUNTER — Ambulatory Visit: Payer: Medicare HMO | Admitting: Cardiology

## 2020-10-29 ENCOUNTER — Encounter: Payer: Self-pay | Admitting: Cardiology

## 2020-10-29 ENCOUNTER — Other Ambulatory Visit: Payer: Self-pay

## 2020-10-29 VITALS — BP 112/50 | HR 81 | Ht 62.0 in | Wt 217.0 lb

## 2020-10-29 DIAGNOSIS — E785 Hyperlipidemia, unspecified: Secondary | ICD-10-CM

## 2020-10-29 DIAGNOSIS — I2510 Atherosclerotic heart disease of native coronary artery without angina pectoris: Secondary | ICD-10-CM | POA: Diagnosis not present

## 2020-10-29 DIAGNOSIS — R079 Chest pain, unspecified: Secondary | ICD-10-CM | POA: Diagnosis not present

## 2020-10-29 DIAGNOSIS — I10 Essential (primary) hypertension: Secondary | ICD-10-CM

## 2020-10-29 NOTE — Telephone Encounter (Signed)
Jessica Sable, MD  You; Wynona Luna, [place], RN 15 hours ago (5:26 PM)   Continue Imdur as prescribed. Keep appointment tomorrow.   Routing comment

## 2020-10-29 NOTE — Patient Instructions (Signed)
Medication Instructions:  Your physician recommends that you continue on your current medications as directed. Please refer to the Current Medication list given to you today.  *If you need a refill on your cardiac medications before your next appointment, please call your pharmacy*   Lab Work: None ordered   Testing/Procedures:  [place]  Your caregiver has ordered a Stress Test with nuclear imaging. The purpose of this test is to evaluate the blood supply to your heart muscle. This procedure is referred to as a "Non-Invasive Stress Test." This is because other than having an IV started in your vein, nothing is inserted or "invades" your body. Cardiac stress tests are done to find areas of poor blood flow to the heart by determining the extent of coronary artery disease (CAD). Some patients exercise on a treadmill, which naturally increases the blood flow to your heart, while others who are  unable to walk on a treadmill due to physical limitations have a pharmacologic/chemical stress agent called Lexiscan . This medicine will mimic walking on a treadmill by temporarily increasing your coronary blood flow.   Please note: these test may take anywhere between 2-4 hours to complete  PLEASE REPORT TO [place] AT THE FIRST DESK WILL DIRECT YOU WHERE TO GO  Date of Procedure:_____________________________________  Arrival Time for Procedure:______________________________  Instructions regarding medication:   __X__ : Hold diabetes medication morning of procedure - Actos, Metformin  ____:  Hold betablocker(s) night before procedure and morning of procedure  __X__:  Hold other medications as follows: Take HALF dose of insulin the evening before test (if you take insulin in the evening), and HOLD insulin the morning of procedure   PLEASE NOTIFY THE OFFICE AT LEAST 24 HOURS IN ADVANCE IF YOU ARE UNABLE TO KEEP YOUR APPOINTMENT.  [phone] AND  PLEASE  NOTIFY NUCLEAR MEDICINE AT [facility] AT LEAST 24 HOURS IN ADVANCE IF YOU ARE UNABLE TO KEEP YOUR APPOINTMENT. [phone]  How to prepare for your Myoview test:  1. Do not eat or drink after midnight 2. No caffeine for 24 hours prior to test 3. No smoking 24 hours prior to test. 4. Your medication may be taken with water.  If your doctor stopped a medication because of this test, do not take that medication. 5. Ladies, please do not wear dresses.  Skirts or pants are appropriate. Please wear a short sleeve shirt. 6. No perfume, cologne or lotion. 7. Wear comfortable walking shoes. No heels!    Follow-Up: At [facility], you and your health needs are our priority.  As part of our continuing mission to provide you with exceptional heart care, we have created designated Provider Care Teams.  These Care Teams include your primary Cardiologist (physician) and Advanced Practice Providers (APPs -  Physician Assistants and Nurse Practitioners) who all work together to provide you with the care you need, when you need it.  We recommend signing up for the patient portal called "MyChart".  Sign up information is provided on this After Visit Summary.  MyChart is used to connect with patients for Virtual Visits (Telemedicine).  Patients are able to view lab/test results, encounter notes, upcoming appointments, etc.  Non-urgent messages can be sent to your provider as well.   To learn more about what you can do with MyChart, go to [email or web address].    Your next appointment:   6 week(s)  The format for your next appointment:   In Person  Provider:   Aaron Edelman  Garen Lah, MD

## 2020-10-29 NOTE — Progress Notes (Signed)
Cardiology Office Note:    Date:  10/29/2020   ID:  [name], DOB 05-15-1951, MRN [id number]  PCP:  Ria Bush, MD  [facility] Cardiologist:  Kate Sable, MD  [place] Electrophysiologist:  None   Referring MD: Ria Bush, MD   Chief Complaint  Patient presents with  . Other    Patient c/o new onset of chest pain with worsening SOB - starting Tuesday night. Meds reviewed verbally with patient.     History of Present Illness:    [name] is a 70 y.o. female with a hx of CAD/CABG x 1 (07/2020), hypertension, hyperlipidemia, diabetes, former smoker x40 years, COPD, OSA who presents due to chest pain.  States having worsening chest pain over the past 4 days.  Presented to the ED 2 days ago, at which point when I was called and recommended patient start Imdur 15 mg daily for antianginal benefit.  Her blood pressure medications were previously stopped due to low blood pressures.  She states her symptoms of chest discomfort has improved, her blood pressure was a little bit low since starting Imdur.  She has no other concerns at this time.   Prior notes Intraoperative TEE, EF approximately 50%. Right and left heart cath 06/2020 single-vessel CAD sequential 70% ostial, 90% mid LAD.   Past Medical History:  Diagnosis Date  . Anxiety   . Arthritis    per patient "all over body"  . Bipolar disorder ([place])    has stopped seeing psychiatrist  . CAD (coronary artery disease)    a. 06/2020 MV: small, sev, apical inf/apical defect-scar vs ischemia. EF 45%; b. 06/2020 Cath: LM 22m, LAD 70ost, 64m, LCX nl, RCA nl; c. 07/2020 CABG x 1: LIMA->LAD.  Marland Kitchen Cataract   . Chronic bronchitis   . COPD (chronic obstructive pulmonary disease) ([place])   . Coronary artery disease, non-occlusive   . Depression   . Dyspnea 2012   s/p pulm/cards w/u WNL, thought due to obesity/deconditioning  . Fibromyalgia   . GERD (gastroesophageal reflux disease)   . HFmrEF (heart  failure with mid-range ejection fraction) ([place])    a. 05/2020 Echo: EF 40%, glob HK, mild LVH, Gr1 DD; b. 06/2020 EF by SPECT: 45%; c. 07/2020 intraop TEE: EF 50%.  . History of chicken pox   . HLD (hyperlipidemia)   . Hypertension   . Hypothyroidism   . Ischemic cardiomyopathy    a. 05/2020 Echo: EF 40%; b. 06/2020 EF by SPECT: 45%; c. 07/2020 intraop TEE: EF 50%.  . Migraine   . OSA (obstructive sleep apnea) 03/16/2011  . Pneumonia   . Sleep apnea    per patient cannot tolerate CPAP  . T2DM (type 2 diabetes mellitus) ([place]) 2012   DM education 06/2011  . Urinary incontinence     Past Surgical History:  Procedure Laterality Date  . BREAST CYST ASPIRATION Right 2003  . cardiac catherization  03/2011   x3 [facility]), mild nonobstructive CAD  . COLONOSCOPY  12/2011   hyperplastic polyp x2,. diverticulosis, rec rpt 10 yrs  . CORONARY ARTERY BYPASS GRAFT N/A 08/03/2020   Procedure: CORONARY ARTERY BYPASS GRAFTING TIMES ONE USING LEFT INTERNAL MAMMARY ARTERY.;  Surgeon: Gaye Pollack, MD;  Location: [place] OR;  Service: Open Heart Surgery;  Laterality: N/A;  . ESOPHAGOGASTRODUODENOSCOPY  2006  . ESOPHAGOGASTRODUODENOSCOPY  05/2020   no esophageal abnormality to explain dysphagia - esophagus dilated. Erosive gastropathy - neg H pylori [facility])  . KNEE SURGERY  2006  left, torn meniscus  . RIGHT/LEFT HEART CATH AND CORONARY ANGIOGRAPHY N/A 07/13/2020   Procedure: RIGHT/LEFT HEART CATH AND CORONARY ANGIOGRAPHY;  Surgeon: Nelva Bush, MD;  Location: [place] [facility];  Service: Cardiovascular;  Laterality: N/A;  . TEE WITHOUT CARDIOVERSION N/A 08/03/2020   Procedure: TRANSESOPHAGEAL ECHOCARDIOGRAM (TEE);  Surgeon: Gaye Pollack, MD;  Location: [place];  Service: Open Heart Surgery;  Laterality: N/A;  . TOTAL ABDOMINAL HYSTERECTOMY  2004   fibroids, cervix remained  . TOTAL KNEE ARTHROPLASTY Left 08/2012   [name], [place] ortho    Current Medications: No outpatient medications  have been marked as taking for the 10/29/20 encounter (Office Visit) with Kate Sable, MD.     Allergies:   Crestor [rosuvastatin], Lipitor [atorvastatin], Lithium, and Quetiapine   Social History   Socioeconomic History  . Marital status: Married    Spouse name: Not on file  . Number of children: Not on file  . Years of education: Not on file  . Highest education level: Not on file  Occupational History  . Occupation: disabled    Employer: OTHER  Tobacco Use  . Smoking status: Former Smoker    Packs/day: 2.00    Years: 43.00    Pack years: 86.00    Types: Cigarettes    Quit date: 05/17/2010    Years since quitting: 10.4  . Smokeless tobacco: Never Used  . Tobacco comment: QUIT 2011  Vaping Use  . Vaping Use: Never used  Substance and Sexual Activity  . Alcohol use: No    Alcohol/week: 0.0 standard drinks  . Drug use: No  . Sexual activity: Yes    Partners: Male  Other Topics Concern  . Not on file  Social History Narrative   Caffeine: 2-3 coffee/am, 1 cup tea in afternoon; Lives with husband, 1 dog, 2 cats; Occupation: disability since 2009 for bipolar, previously worked at [employer]; Diet: not many fruits, good vegetables, good amt water.      Smoker 43 years; quit in 2011. No alcohol. Lives in [place]; with husband.    Social Determinants of Health   Financial Resource Strain: Not on file  Food Insecurity: Not on file  Transportation Needs: Not on file  Physical Activity: Not on file  Stress: Not on file  Social Connections: Not on file     Family History: The patient's family history includes Acute lymphoblastic leukemia (age of onset: 49) in her cousin; Aneurysm in her maternal uncle; Arthritis in her mother; Asthma in her maternal uncle; Breast cancer in her maternal aunt; COPD in her sister; Cancer in her maternal uncle and paternal uncle; Cancer (age of onset: 48) in her father; Cancer (age of onset: 61) in her maternal aunt; Colon cancer (age of onset:  48) in her maternal uncle; Diabetes in her father; Hypertension in her mother; Stroke in her mother; Thyroid disease in her father. There is no history of Coronary artery disease, Stomach cancer, Rectal cancer, or Esophageal cancer.  ROS:   Please see the history of present illness.     All other systems reviewed and are negative.  EKGs/Labs/Other Studies Reviewed:    The following studies were reviewed today:   EKG:  EKG is  ordered today.  The ekg ordered today demonstrates sinus rhythm  Recent Labs: 08/04/2020: Magnesium 1.8 09/14/2020: ALT 9; TSH 1.37 10/27/2020: BUN 17; Creatinine, Ser 0.66; Hemoglobin 10.8; Platelets 400; Potassium 3.9; Sodium 139  Recent Lipid Panel    Component Value Date/Time   CHOL 131  05/21/2020 0915   TRIG 239.0 (H) 05/21/2020 0915   HDL 42.80 05/21/2020 0915   CHOLHDL 3 05/21/2020 0915   VLDL 47.8 (H) 05/21/2020 0915   LDLCALC 48 12/08/2013 0824   LDLDIRECT 65.0 05/21/2020 0915     Risk Assessment/Calculations:      Physical Exam:    VS:  BP (!) 112/50 (BP Location: Right Arm, Patient Position: Sitting, Cuff Size: Large)   Pulse 81   Ht 5\' 2"  (1.575 m)   Wt 217 lb (98.4 kg)   SpO2 97%   BMI 39.69 kg/m     Wt Readings from Last 3 Encounters:  10/29/20 217 lb (98.4 kg)  10/27/20 215 lb (97.5 kg)  10/14/20 218 lb (98.9 kg)     GEN:  Well nourished, well developed in no acute distress HEENT: Normal NECK: No JVD; No carotid bruits LYMPHATICS: No lymphadenopathy CARDIAC: RRR, no murmurs, rubs, gallops RESPIRATORY:  Clear to auscultation, decreased breath sounds at bases ABDOMEN: Soft, non-tender, non-distended MUSCULOSKELETAL:  No edema; No deformity  SKIN: Warm and dry NEUROLOGIC:  Alert and oriented x 3 PSYCHIATRIC:  Normal affect   ASSESSMENT:    1. Coronary artery disease involving native coronary artery of native heart, unspecified whether angina present   2. Primary hypertension   3. Hyperlipidemia LDL goal <70   4.  Obesity, morbid, BMI 40.0-49.9 (HCC)   5. Chest pain, unspecified type    PLAN:    In order of problems listed above:  1. CAD/CABG x1 on 07/2020.  Having chest pain symptoms, improved with Imdur.  Planning on going through physical therapy.  We obtain a Myoview to evaluate any significant ischemia.tolerating aspirin, simvastatin.  Intraoperative TEE with preserved ejection fraction of about 50%.  Last LDL at goal. 2. hypertension, BP low normal.  Continue Imdur 50 mg daily. 3. hyperlipidemia, continue statin.  LDL at goal 4. morbidly obese, continue healthy eating diet, weight loss.  Follow-up in 6 weeks  Shared Decision Making/Informed Consent The risks [chest pain, shortness of breath, cardiac arrhythmias, dizziness, blood pressure fluctuations, myocardial infarction, stroke/transient ischemic attack, nausea, vomiting, allergic reaction, radiation exposure, metallic taste sensation and life-threatening complications (estimated to be 1 in 10,000)], benefits (risk stratification, diagnosing coronary artery disease, treatment guidance) and alternatives of a nuclear stress test were discussed in detail with Ms. Baus and she agrees to proceed.   Medication Adjustments/Labs and Tests Ordered: Current medicines are reviewed at length with the patient today.  Concerns regarding medicines are outlined above.  Orders Placed This Encounter  Procedures  . NM Myocar Multi W/Spect W/Wall Motion / EF  . Cardiac Stress Test: Informed Consent Details: Physician/Practitioner Attestation; Transcribe to consent form and obtain patient signature  . EKG 12-Lead   No orders of the defined types were placed in this encounter.   Patient Instructions  Medication Instructions:  Your physician recommends that you continue on your current medications as directed. Please refer to the Current Medication list given to you today.  *If you need a refill on your cardiac medications before your next appointment,  please call your pharmacy*   Lab Work: None ordered   Testing/Procedures:  [place]  Your caregiver has ordered a Stress Test with nuclear imaging. The purpose of this test is to evaluate the blood supply to your heart muscle. This procedure is referred to as a "Non-Invasive Stress Test." This is because other than having an IV started in your vein, nothing is inserted or "invades" your body. Cardiac stress  tests are done to find areas of poor blood flow to the heart by determining the extent of coronary artery disease (CAD). Some patients exercise on a treadmill, which naturally increases the blood flow to your heart, while others who are  unable to walk on a treadmill due to physical limitations have a pharmacologic/chemical stress agent called Lexiscan . This medicine will mimic walking on a treadmill by temporarily increasing your coronary blood flow.   Please note: these test may take anywhere between 2-4 hours to complete  PLEASE REPORT TO [place] AT THE FIRST DESK WILL DIRECT YOU WHERE TO GO  Date of Procedure:_____________________________________  Arrival Time for Procedure:______________________________  Instructions regarding medication:   __X__ : Hold diabetes medication morning of procedure - Actos, Metformin  ____:  Hold betablocker(s) night before procedure and morning of procedure  __X__:  Hold other medications as follows: Take HALF dose of insulin the evening before test (if you take insulin in the evening), and HOLD insulin the morning of procedure   PLEASE NOTIFY THE OFFICE AT LEAST 24 HOURS IN ADVANCE IF YOU ARE UNABLE TO KEEP YOUR APPOINTMENT.  [phone] AND  PLEASE NOTIFY NUCLEAR MEDICINE AT [facility] AT LEAST 24 HOURS IN ADVANCE IF YOU ARE UNABLE TO KEEP YOUR APPOINTMENT. [phone]  How to prepare for your Myoview test:  5. Do not eat or drink after midnight 6. No caffeine for 24 hours prior to test 7. No smoking 24  hours prior to test. 8. Your medication may be taken with water.  If your doctor stopped a medication because of this test, do not take that medication. 9. Ladies, please do not wear dresses.  Skirts or pants are appropriate. Please wear a short sleeve shirt. 10. No perfume, cologne or lotion. 11. Wear comfortable walking shoes. No heels!    Follow-Up: At [facility], you and your health needs are our priority.  As part of our continuing mission to provide you with exceptional heart care, we have created designated Provider Care Teams.  These Care Teams include your primary Cardiologist (physician) and Advanced Practice Providers (APPs -  Physician Assistants and Nurse Practitioners) who all work together to provide you with the care you need, when you need it.  We recommend signing up for the patient portal called "MyChart".  Sign up information is provided on this After Visit Summary.  MyChart is used to connect with patients for Virtual Visits (Telemedicine).  Patients are able to view lab/test results, encounter notes, upcoming appointments, etc.  Non-urgent messages can be sent to your provider as well.   To learn more about what you can do with MyChart, go to [email or web address].    Your next appointment:   6 week(s)  The format for your next appointment:   In Person  Provider:   Kate Sable, MD     Signed, Kate Sable, MD  10/29/2020 5:03 PM    [place]

## 2020-10-29 NOTE — Telephone Encounter (Signed)
Patient seen in clinic today, results reviewed with Dr. Agbor-Etang.  

## 2020-11-04 ENCOUNTER — Encounter: Payer: Self-pay | Admitting: Physician Assistant

## 2020-11-04 ENCOUNTER — Other Ambulatory Visit: Payer: Self-pay

## 2020-11-04 ENCOUNTER — Ambulatory Visit: Payer: Medicare HMO | Admitting: Physician Assistant

## 2020-11-04 VITALS — BP 118/68 | HR 100 | Ht 62.0 in | Wt 215.2 lb

## 2020-11-04 DIAGNOSIS — R197 Diarrhea, unspecified: Secondary | ICD-10-CM | POA: Diagnosis not present

## 2020-11-04 DIAGNOSIS — J449 Chronic obstructive pulmonary disease, unspecified: Secondary | ICD-10-CM | POA: Diagnosis not present

## 2020-11-04 DIAGNOSIS — R079 Chest pain, unspecified: Secondary | ICD-10-CM | POA: Diagnosis not present

## 2020-11-04 DIAGNOSIS — R634 Abnormal weight loss: Secondary | ICD-10-CM | POA: Diagnosis not present

## 2020-11-04 MED ORDER — ONDANSETRON 4 MG PO TBDP: ORAL_TABLET | ORAL | 3 refills | Status: DC

## 2020-11-04 NOTE — Progress Notes (Signed)
Chief Complaint: Weight loss, nausea  HPI:    [name] is a 70 year old female with a past medical history as listed below including CAD/CABG x1 07/2020 (08/03/2020 TEE with an LVEF 50%), COPD on oxygen, fibromyalgia and heart failure, known to Dr. Fuller Plan, who was referred to me by Ria Bush, MD for a complaint of weight loss and nausea.      01/10/2012 colonoscopy with a 4 mm polyp in the sigmoid colon and 7 mm polyp in the rectum.  Polyps were hyperplastic and repeat recommended in 10 years.    06/03/2020 EGD for dysphagia with erosive gastropathy, erythematous mucosa in the gastric body, there is no endoscopic esophageal abnormality to explain dysphagia but the esophagus was dilated empirically.  Pathology showed reactive gastropathy.    10/07/2020 CT of the abdomen pelvis with contrast showed no acute intra-abdominal or pelvic pathology, colonic diverticulosis, aortic atherosclerosis.    Pending nuc med myocardial stress test scheduled 11/09/2020.    Today, the patient explains that in December she had a CABG and since then she has lost 25 pounds.  Describes that since that time she has not been able to eat carbs including pasta and her favorite mashed potatoes because they make her nauseous, due to this has been eating healthier.  Tells me that she is [place] so Marella Bile is a big part of her diet, she is sad she cannot eat it anymore.    Also describes an increase in diarrheal stools.  Tells me at least 2 to 3 days out of the week she will have loose stools 3-4 a day.  She typically takes an Imodium which abates this. She is on Metformin.    Denies fever, chills, heartburn, reflux, abdominal pain or symptoms that awaken her from sleep.  Past Medical History:  Diagnosis Date  . Anxiety   . Arthritis    per patient "all over body"  . Bipolar disorder ([place])    has stopped seeing psychiatrist  . CAD (coronary artery disease)    a. 06/2020 MV: small, sev, apical inf/apical defect-scar vs  ischemia. EF 45%; b. 06/2020 Cath: LM 21m, LAD 70ost, 32m, LCX nl, RCA nl; c. 07/2020 CABG x 1: LIMA->LAD.  Marland Kitchen Cataract   . Chronic bronchitis   . COPD (chronic obstructive pulmonary disease) ([place])   . Coronary artery disease, non-occlusive   . Depression   . Dyspnea 2012   s/p pulm/cards w/u WNL, thought due to obesity/deconditioning  . Fibromyalgia   . GERD (gastroesophageal reflux disease)   . HFmrEF (heart failure with mid-range ejection fraction) ([place])    a. 05/2020 Echo: EF 40%, glob HK, mild LVH, Gr1 DD; b. 06/2020 EF by SPECT: 45%; c. 07/2020 intraop TEE: EF 50%.  . History of chicken pox   . HLD (hyperlipidemia)   . Hypertension   . Hypothyroidism   . Ischemic cardiomyopathy    a. 05/2020 Echo: EF 40%; b. 06/2020 EF by SPECT: 45%; c. 07/2020 intraop TEE: EF 50%.  . Migraine   . OSA (obstructive sleep apnea) 03/16/2011  . Pneumonia   . Sleep apnea    per patient cannot tolerate CPAP  . T2DM (type 2 diabetes mellitus) ([place]) 2012   DM education 06/2011  . Urinary incontinence     Past Surgical History:  Procedure Laterality Date  . BREAST CYST ASPIRATION Right 2003  . cardiac catherization  03/2011   x3 [facility]), mild nonobstructive CAD  . COLONOSCOPY  12/2011   hyperplastic polyp x2,. diverticulosis,  rec rpt 10 yrs  . CORONARY ARTERY BYPASS GRAFT N/A 08/03/2020   Procedure: CORONARY ARTERY BYPASS GRAFTING TIMES ONE USING LEFT INTERNAL MAMMARY ARTERY.;  Surgeon: Gaye Pollack, MD;  Location: [facility];  Service: Open Heart Surgery;  Laterality: N/A;  . ESOPHAGOGASTRODUODENOSCOPY  2006  . ESOPHAGOGASTRODUODENOSCOPY  05/2020   no esophageal abnormality to explain dysphagia - esophagus dilated. Erosive gastropathy - neg H pylori Fuller Plan)  . KNEE SURGERY  2006   left, torn meniscus  . RIGHT/LEFT HEART CATH AND CORONARY ANGIOGRAPHY N/A 07/13/2020   Procedure: RIGHT/LEFT HEART CATH AND CORONARY ANGIOGRAPHY;  Surgeon: Nelva Bush, MD;  Location: [facility];   Service: Cardiovascular;  Laterality: N/A;  . TEE WITHOUT CARDIOVERSION N/A 08/03/2020   Procedure: TRANSESOPHAGEAL ECHOCARDIOGRAM (TEE);  Surgeon: Gaye Pollack, MD;  Location: [facility];  Service: Open Heart Surgery;  Laterality: N/A;  . TOTAL ABDOMINAL HYSTERECTOMY  2004   fibroids, cervix remained  . TOTAL KNEE ARTHROPLASTY Left 08/2012   [name], [place] [facility]    Current Outpatient Medications  Medication Sig Dispense Refill  . albuterol (VENTOLIN HFA) 108 (90 Base) MCG/ACT inhaler Inhale 2 puffs into the lungs every 6 (six) hours as needed for wheezing or shortness of breath. 8 g 3  . Apple Cider Vinegar 600 MG CAPS     . aspirin 81 MG EC tablet Take 1 tablet (81 mg total) by mouth daily.    . [place]-Glucosamine-Vit D (OSTEO BI-FLEX ONE PER DAY PO) Take 1 tablet by mouth daily.    . famotidine (PEPCID) 20 MG tablet Take 20 mg by mouth every other day. Alternates with omeprazole    . INS SYRINGE/NEEDLE 1CC/28G (B-D INS SYR MICROFINE 1CC/28G) 28G X 1/2" 1 ML MISC USE AS DIRECTED 100 each 3  . insulin NPH Human (NOVOLIN N) 100 UNIT/ML injection Inject 0.2 mLs (20 Units total) into the skin daily after supper.    . isosorbide mononitrate (IMDUR) 30 MG 24 hr tablet Take 0.5 tablets (15 mg total) by mouth daily. 15 tablet 1  . levothyroxine (SYNTHROID) 100 MCG tablet TAKE 1 TABLET DAILY, AND ON2 DAYS A WEEK, TAKE AN     EXTRA 1/2 TABLET 90 tablet 3  . loratadine (CLARITIN) 10 MG tablet Take 1 tablet (10 mg total) by mouth daily.    . metFORMIN (GLUCOPHAGE) 500 MG tablet Take 500 mg by mouth 2 (two) times daily with a meal.    . Multiple Vitamin (MULTIVITAMIN WITH MINERALS) TABS tablet Take 1 tablet by mouth daily. One A Day for Women    . nystatin cream (MYCOSTATIN) APPLY TO AFFECTED AREA TWICE A DAY 30 g 1  . omeprazole (PRILOSEC) 20 MG capsule TAKE 1 DAILY-ALTERNATES WITH FAMOTIDINE 90 capsule 1  . ONE TOUCH LANCETS MISC Use to check sugar twice daily Dx: E11.8 200 each 3  . ONETOUCH  ULTRA test strip USE TO CHECK BLOOD SUGAR 2 TIMES   DAILY 200 strip 3  . OXYGEN Inhale 2 L into the lungs at bedtime.    . pioglitazone (ACTOS) 15 MG tablet TAKE 1 TABLET DAILY 90 tablet 0  . Rhubarb (ESTROVEN COMPLETE PO) Take 1 tablet by mouth daily.    . simvastatin (ZOCOR) 40 MG tablet TAKE 1 TABLET AT BEDTIME 90 tablet 3  . topiramate (TOPAMAX) 100 MG tablet Take 1 tablet (100 mg total) by mouth at bedtime. 90 tablet 1  . umeclidinium-vilanterol (ANORO ELLIPTA) 62.5-25 MCG/INH AEPB Inhale 1 puff into the lungs daily. [place]  each 5  . ziprasidone (GEODON) 40 MG capsule Take 1 capsule (40 mg total) by mouth in the morning and at bedtime.     No current facility-administered medications for this visit.    Allergies as of 11/04/2020 - Review Complete 11/04/2020  Allergen Reaction Noted  . Crestor [rosuvastatin] Other (See Comments) 07/14/2012  . Lipitor [atorvastatin] Other (See Comments) 07/14/2012  . Lithium Other (See Comments) 01/03/2011  . Quetiapine Other (See Comments) 04/18/2011    Family History  Problem Relation Age of Onset  . Asthma Maternal Uncle   . Cancer Maternal Uncle        colon  . Aneurysm Maternal Uncle   . Colon cancer Maternal Uncle 52  . Hypertension Mother   . Stroke Mother   . Arthritis Mother   . COPD Sister   . Acute lymphoblastic leukemia Cousin 3       died at age 67  . Cancer Father 17       brain tumor  . Diabetes Father   . Thyroid disease Father   . Cancer Maternal Aunt 74       breast  . Breast cancer Maternal Aunt   . Cancer Paternal Uncle        prostate  . Coronary artery disease Neg Hx   . Stomach cancer Neg Hx   . Rectal cancer Neg Hx   . Esophageal cancer Neg Hx     Social History   Socioeconomic History  . Marital status: Married    Spouse name: Not on file  . Number of children: Not on file  . Years of education: Not on file  . Highest education level: Not on file  Occupational History  . Occupation: disabled     Employer: OTHER  Tobacco Use  . Smoking status: Former Smoker    Packs/day: 2.00    Years: 43.00    Pack years: 86.00    Types: Cigarettes    Quit date: 05/17/2010    Years since quitting: 10.4  . Smokeless tobacco: Never Used  . Tobacco comment: QUIT 2011  Vaping Use  . Vaping Use: Never used  Substance and Sexual Activity  . Alcohol use: No    Alcohol/week: 0.0 standard drinks  . Drug use: No  . Sexual activity: Yes    Partners: Male  Other Topics Concern  . Not on file  Social History Narrative   Caffeine: 2-3 coffee/am, 1 cup tea in afternoon; Lives with husband, 1 dog, 2 cats; Occupation: disability since 2009 for bipolar, previously worked at [employer]; Diet: not many fruits, good vegetables, good amt water.      Smoker 43 years; quit in 2011. No alcohol. Lives in [place]; with husband.    Social Determinants of Health   Financial Resource Strain: Not on file  Food Insecurity: Not on file  Transportation Needs: Not on file  Physical Activity: Not on file  Stress: Not on file  Social Connections: Not on file  Intimate Partner Violence: Not on file    Review of Systems:    Constitutional: No fever or chills Cardiovascular: +chest pain Respiratory: No SOB  Gastrointestinal: See HPI and otherwise negative   Physical Exam:  Vital signs: BP 118/68 (BP Location: Left Arm, Patient Position: Sitting, Cuff Size: Large)   Pulse 100   Ht 5\' 2"  (1.575 m)   Wt 215 lb 4 oz (97.6 kg)   BMI 39.37 kg/m   Constitutional:   Pleasant overweight Caucasian female appears to  be in NAD, Well developed, Well nourished, alert and cooperative Respiratory: Respirations even and unlabored. Lungs clear to auscultation bilaterally.   No wheezes, crackles, or rhonchi.  Cardiovascular: Normal S1, S2. No MRG. Regular rate and rhythm. No peripheral edema, cyanosis or pallor.  Gastrointestinal:  Soft, nondistended, nontender. No rebound or guarding. Normal bowel sounds. No appreciable masses or  hepatomegaly. Rectal:  Not performed.  Psychiatric: Demonstrates good judgement and reason without abnormal affect or behaviors.  RELEVANT LABS AND IMAGING: CBC    Component Value Date/Time   WBC 17.9 (H) 10/27/2020 0955   RBC 3.68 (L) 10/27/2020 0955   HGB 10.8 (L) 10/27/2020 0955   HGB 11.0 (L) 08/26/2020 1439   HCT 33.5 (L) 10/27/2020 0955   HCT 33.6 (L) 08/26/2020 1439   PLT 400 10/27/2020 0955   PLT 652 (H) 08/26/2020 1439   MCV 91.0 10/27/2020 0955   MCV 91 08/26/2020 1439   MCV 95 08/30/2012 0604   MCH 29.3 10/27/2020 0955   MCHC 32.2 10/27/2020 0955   RDW 16.4 (H) 10/27/2020 0955   RDW 13.2 08/26/2020 1439   RDW 13.4 08/30/2012 0604   LYMPHSABS 2,696 09/27/2020 1117   LYMPHSABS 1.9 08/30/2012 0604   MONOABS 0.9 09/14/2020 1315   MONOABS 1.2 (H) 08/30/2012 0604   EOSABS 159 09/27/2020 1117   EOSABS 0.1 08/30/2012 0604   BASOSABS 61 09/27/2020 1117   BASOSABS 0.1 08/30/2012 0604    CMP     Component Value Date/Time   NA 139 10/27/2020 0955   NA 137 08/26/2020 1439   NA 134 (L) 08/30/2012 0604   K 3.9 10/27/2020 0955   K 3.9 08/30/2012 0604   CL 111 10/27/2020 0955   CL 102 08/30/2012 0604   CO2 21 (L) 10/27/2020 0955   CO2 25 08/30/2012 0604   GLUCOSE 133 (H) 10/27/2020 0955   GLUCOSE 182 (H) 08/30/2012 0604   BUN 17 10/27/2020 0955   BUN 9 08/26/2020 1439   BUN 9 08/30/2012 0604   CREATININE 0.66 10/27/2020 0955   CREATININE 0.93 07/30/2015 1652   CALCIUM 9.2 10/27/2020 0955   CALCIUM 8.4 (L) 08/30/2012 0604   PROT 7.3 09/14/2020 1315   ALBUMIN 4.1 09/14/2020 1315   AST 13 09/14/2020 1315   ALT 9 09/14/2020 1315   ALKPHOS 65 09/14/2020 1315   BILITOT 0.3 09/14/2020 1315   GFRNONAA >60 10/27/2020 0955   GFRNONAA >60 08/30/2012 0604   GFRAA 80 08/26/2020 1439   GFRAA >60 08/30/2012 0604    Assessment: 1.  Weight loss: About 25 pounds over the past 3 months, patient has now taken on a low carb diet to due to nausea which is likely the cause 2.   Nausea: Only with carbs since her CABG in December; uncertain etiology 3.  Chest pain: Recent CABG in December, currently being worked up by cardiology with a stress test scheduled in the next 5 days  Plan: 1.  Reviewed patient's recent labs and imaging with her as well as recent EGD.  All of this is very reassuring that she does not have cancer causing her weight loss.  My high suspicion is that she has changed to a low carb diet and in turn a healthier diet and has lost some weight.  For now, given that she is currently undergoing cardiac work-up for chest pain and had a recent CABG in December, would recommend that we not put her through an endoscopic procedure.  Could consider colonoscopy in 3 months or so  if she is still losing weight and we are concerned. 2.  For now prescribed Zofran 4 mg ODT every 4-6 hours as needed for nausea.  #30 with 3 refills.  Did discuss that she can also use this preemptively, taking 15 to 20 minutes before a meal that she would like to eat. 3.  Discussed diarrhea, this is usually only 2 to 3 days out of the week, could be related to her Metformin versus IBS versus her new diet.  Would recommend that she start a fiber supplement daily to see if this helps with the alteration. 4.  Patient to follow in clinic with me in 2 to 3 months.  At that time if she has continued to drastically lose weight we can further discuss invasive procedures.  Jessica Newer, PA-C [facility] 11/04/2020, 3:26 PM  Cc: Ria Bush, MD

## 2020-11-04 NOTE — Patient Instructions (Signed)
We have sent the following medications to your pharmacy for you to pick up at your convenience: zofran taking one tablet by mouth every 4-6 hours as needed. You can also try taking a tablet 15-20 minutes before a meal.   Start an over the counter fiber supplement daily.  Normal BMI (Body Mass Index- based on height and weight) is between 23 and 30. Your BMI today is Body mass index is 39.37 kg/m. Marland Kitchen Please consider follow up  regarding your BMI with your Primary Care Provider.

## 2020-11-08 NOTE — Progress Notes (Signed)
Reviewed and agree with management plan.  Nuri Larmer T. Jilliana Burkes, MD FACG [phone]  

## 2020-11-09 ENCOUNTER — Encounter
Admission: RE | Admit: 2020-11-09 | Discharge: 2020-11-09 | Disposition: A | Discharge status: Home / Self Care | Payer: Medicare HMO | Source: Ambulatory Visit | Attending: Cardiology | Admitting: Cardiology

## 2020-11-09 ENCOUNTER — Other Ambulatory Visit: Payer: Self-pay

## 2020-11-09 DIAGNOSIS — Z794 Long term (current) use of insulin: Secondary | ICD-10-CM

## 2020-11-09 DIAGNOSIS — R079 Chest pain, unspecified: Secondary | ICD-10-CM | POA: Insufficient documentation

## 2020-11-09 DIAGNOSIS — I2510 Atherosclerotic heart disease of native coronary artery without angina pectoris: Secondary | ICD-10-CM | POA: Insufficient documentation

## 2020-11-09 DIAGNOSIS — E118 Type 2 diabetes mellitus with unspecified complications: Secondary | ICD-10-CM

## 2020-11-09 MED ORDER — TECHNETIUM TC 99M TETROFOSMIN IV KIT
30.0000 | PACK | Freq: Once | INTRAVENOUS | Status: AC | PRN
  Administered 2020-11-09: 32.188 via INTRAVENOUS

## 2020-11-09 MED ORDER — REGADENOSON 0.4 MG/5ML IV SOLN
0.4000 mg | Freq: Once | INTRAVENOUS | Status: AC
  Administered 2020-11-09: 0.4 mg via INTRAVENOUS

## 2020-11-09 MED ORDER — TECHNETIUM TC 99M TETROFOSMIN IV KIT
10.0000 | PACK | Freq: Once | INTRAVENOUS | Status: AC | PRN
  Administered 2020-11-09: 10.535 via INTRAVENOUS

## 2020-11-09 NOTE — Telephone Encounter (Signed)
Pharmacy reports pt requesting refills of metformin and pioglitazone. Unsure what dose metformin you have pt taking currently so have not sent in either medication.   Last OV  09/27/20 Next OV   12/27/20

## 2020-11-10 ENCOUNTER — Telehealth: Payer: Self-pay | Admitting: Cardiology

## 2020-11-10 LAB — NM MYOCAR MULTI W/SPECT W/WALL MOTION / EF
Estimated workload: 1 METS
Exercise duration (min): 0 min
Exercise duration (sec): 0 s
LV dias vol: 102 mL (ref 46–106)
LV sys vol: 60 mL
MPHR: 152 {beats}/min
Peak HR: 99 {beats}/min
Percent HR: 65 %
Rest HR: 85 {beats}/min
TID: 0.76

## 2020-11-10 NOTE — Telephone Encounter (Signed)
Patient calling for stress test results and to see if she is clear to return to PT

## 2020-11-11 MED ORDER — PIOGLITAZONE HCL 15 MG PO TABS: 15.0000 mg | ORAL_TABLET | Freq: Every day | ORAL | 1 refills | Status: DC

## 2020-11-11 MED ORDER — METFORMIN HCL 500 MG PO TABS: 500.0000 mg | ORAL_TABLET | Freq: Two times a day (BID) | ORAL | 1 refills | Status: DC

## 2020-11-11 NOTE — Telephone Encounter (Signed)
Patient calling to check status of results  Made aware they are not ready at this time Please call when ready

## 2020-11-12 NOTE — Telephone Encounter (Signed)
The patient has been notified of the result and verbalized understanding.  All questions (if any) were answered. Kavin Leech, RN 11/12/2020 4:51 PM

## 2020-11-23 ENCOUNTER — Ambulatory Visit (INDEPENDENT_AMBULATORY_CARE_PROVIDER_SITE_OTHER): Payer: Medicare HMO

## 2020-11-23 ENCOUNTER — Other Ambulatory Visit: Payer: Self-pay

## 2020-11-23 DIAGNOSIS — Z0000 Encounter for general adult medical examination without abnormal findings: Secondary | ICD-10-CM | POA: Diagnosis not present

## 2020-11-23 NOTE — Patient Instructions (Signed)
[name] , Thank you for taking time to come for your Medicare Wellness Visit. I appreciate your ongoing commitment to your health goals. Please review the following plan we discussed and let me know if I can assist you in the future.   Screening recommendations/referrals: Colonoscopy: Up to date, completed 01/10/2012, due 12/2021 Mammogram: scheduled 12/10/2020 Bone Density: due, Please call and schedule appointment Recommended yearly ophthalmology/optometry visit for glaucoma screening and checkup Recommended yearly dental visit for hygiene and checkup  Vaccinations: Influenza vaccine: Up to date, completed 05/26/2020, due 03/2021 Pneumococcal vaccine: due, will get at next office visit  Tdap vaccine: Up to date, completed 04/18/2011, due 03/2021 Shingles vaccine: Completed series   Covid-19:Completed series  Advanced directives: Advance directive discussed with you today. Even though you declined this today please call our office should you change your mind and we can give you the proper paperwork for you to fill out.  Conditions/risks identified: diabetes, hypertension, hyperlipidemia   Next appointment: Follow up in one year for your annual wellness visit    Preventive Care 30 Years and Older, Female Preventive care refers to lifestyle choices and visits with your health care provider that can promote health and wellness. What does preventive care include?  A yearly physical exam. This is also called an annual well check.  Dental exams once or twice a year.  Routine eye exams. Ask your health care provider how often you should have your eyes checked.  Personal lifestyle choices, including:  Daily care of your teeth and gums.  Regular physical activity.  Eating a healthy diet.  Avoiding tobacco and drug use.  Limiting alcohol use.  Practicing safe sex.  Taking low-dose aspirin every day.  Taking vitamin and mineral supplements as recommended by your health care  provider. What happens during an annual well check? The services and screenings done by your health care provider during your annual well check will depend on your age, overall health, lifestyle risk factors, and family history of disease. Counseling  Your health care provider may ask you questions about your:  Alcohol use.  Tobacco use.  Drug use.  Emotional well-being.  Home and relationship well-being.  Sexual activity.  Eating habits.  History of falls.  Memory and ability to understand (cognition).  Work and work Statistician.  Reproductive health. Screening  You may have the following tests or measurements:  Height, weight, and BMI.  Blood pressure.  Lipid and cholesterol levels. These may be checked every 5 years, or more frequently if you are over 61 years old.  Skin check.  Lung cancer screening. You may have this screening every year starting at age 84 if you have a 30-pack-year history of smoking and currently smoke or have quit within the past 15 years.  Fecal occult blood test (FOBT) of the stool. You may have this test every year starting at age 65.  Flexible sigmoidoscopy or colonoscopy. You may have a sigmoidoscopy every 5 years or a colonoscopy every 10 years starting at age 58.  Hepatitis C blood test.  Hepatitis B blood test.  Sexually transmitted disease (STD) testing.  Diabetes screening. This is done by checking your blood sugar (glucose) after you have not eaten for a while (fasting). You may have this done every 1-3 years.  Bone density scan. This is done to screen for osteoporosis. You may have this done starting at age 50.  Mammogram. This may be done every 1-2 years. Talk to your health care provider about how often  you should have regular mammograms. Talk with your health care provider about your test results, treatment options, and if necessary, the need for more tests. Vaccines  Your health care provider may recommend certain  vaccines, such as:  Influenza vaccine. This is recommended every year.  Tetanus, diphtheria, and acellular pertussis (Tdap, Td) vaccine. You may need a Td booster every 10 years.  Zoster vaccine. You may need this after age 25.  Pneumococcal 13-valent conjugate (PCV13) vaccine. One dose is recommended after age 55.  Pneumococcal polysaccharide (PPSV23) vaccine. One dose is recommended after age 2. Talk to your health care provider about which screenings and vaccines you need and how often you need them. This information is not intended to replace advice given to you by your health care provider. Make sure you discuss any questions you have with your health care provider. Document Released: 09/03/2015 Document Revised: 04/26/2016 Document Reviewed: 06/08/2015 Elsevier Interactive Patient Education  2017  Prevention in the Home Falls can cause injuries. They can happen to people of all ages. There are many things you can do to make your home safe and to help prevent falls. What can I do on the outside of my home?  Regularly fix the edges of walkways and driveways and fix any cracks.  Remove anything that might make you trip as you walk through a door, such as a raised step or threshold.  Trim any bushes or trees on the path to your home.  Use bright outdoor lighting.  Clear any walking paths of anything that might make someone trip, such as rocks or tools.  Regularly check to see if handrails are loose or broken. Make sure that both sides of any steps have handrails.  Any raised decks and porches should have guardrails on the edges.  Have any leaves, snow, or ice cleared regularly.  Use sand or salt on walking paths during winter.  Clean up any spills in your garage right away. This includes oil or grease spills. What can I do in the bathroom?  Use night lights.  Install grab bars by the toilet and in the tub and shower. Do not use towel bars as grab  bars.  Use non-skid mats or decals in the tub or shower.  If you need to sit down in the shower, use a plastic, non-slip stool.  Keep the floor dry. Clean up any water that spills on the floor as soon as it happens.  Remove soap buildup in the tub or shower regularly.  Attach bath mats securely with double-sided non-slip rug tape.  Do not have throw rugs and other things on the floor that can make you trip. What can I do in the bedroom?  Use night lights.  Make sure that you have a light by your bed that is easy to reach.  Do not use any sheets or blankets that are too big for your bed. They should not hang down onto the floor.  Have a firm chair that has side arms. You can use this for support while you get dressed.  Do not have throw rugs and other things on the floor that can make you trip. What can I do in the kitchen?  Clean up any spills right away.  Avoid walking on wet floors.  Keep items that you use a lot in easy-to-reach places.  If you need to reach something above you, use a strong step stool that has a grab bar.  Keep electrical cords  out of the way.  Do not use floor polish or wax that makes floors slippery. If you must use wax, use non-skid floor wax.  Do not have throw rugs and other things on the floor that can make you trip. What can I do with my stairs?  Do not leave any items on the stairs.  Make sure that there are handrails on both sides of the stairs and use them. Fix handrails that are broken or loose. Make sure that handrails are as long as the stairways.  Check any carpeting to make sure that it is firmly attached to the stairs. Fix any carpet that is loose or worn.  Avoid having throw rugs at the top or bottom of the stairs. If you do have throw rugs, attach them to the floor with carpet tape.  Make sure that you have a light switch at the top of the stairs and the bottom of the stairs. If you do not have them, ask someone to add them for  you. What else can I do to help prevent falls?  Wear shoes that:  Do not have high heels.  Have rubber bottoms.  Are comfortable and fit you well.  Are closed at the toe. Do not wear sandals.  If you use a stepladder:  Make sure that it is fully opened. Do not climb a closed stepladder.  Make sure that both sides of the stepladder are locked into place.  Ask someone to hold it for you, if possible.  Clearly mark and make sure that you can see:  Any grab bars or handrails.  First and last steps.  Where the edge of each step is.  Use tools that help you move around (mobility aids) if they are needed. These include:  Canes.  Walkers.  Scooters.  Crutches.  Turn on the lights when you go into a dark area. Replace any light bulbs as soon as they burn out.  Set up your furniture so you have a clear path. Avoid moving your furniture around.  If any of your floors are uneven, fix them.  If there are any pets around you, be aware of where they are.  Review your medicines with your doctor. Some medicines can make you feel dizzy. This can increase your chance of falling. Ask your doctor what other things that you can do to help prevent falls. This information is not intended to replace advice given to you by your health care provider. Make sure you discuss any questions you have with your health care provider. Document Released: 06/03/2009 Document Revised: 01/13/2016 Document Reviewed: 09/11/2014 Elsevier Interactive Patient Education  2017 [employer].

## 2020-11-23 NOTE — Progress Notes (Signed)
Subjective:   [name] is a 70 y.o. female who presents for Medicare Annual (Subsequent) preventive examination.  Review of Systems: N/A      I connected with the patient today by telephone and verified that I am speaking with the correct person using two identifiers. Location patient: home Location nurse: work Persons participating in the telephone visit: patient, nurse.   I discussed the limitations, risks, security and privacy concerns of performing an evaluation and management service by telephone and the availability of in person appointments. I also discussed with the patient that there may be a patient responsible charge related to this service. The patient expressed understanding and verbally consented to this telephonic visit.        Cardiac Risk Factors include: advanced age (>67men, >17 women);diabetes mellitus;hypertension;Other (see comment), Risk factor comments: hyperlipidemia     Objective:    Today's Vitals   There is no height or weight on file to calculate BMI.  Advanced Directives 11/23/2020 10/27/2020 09/01/2020 08/04/2020 07/30/2020 07/13/2020 05/09/2020  Does Patient Have a Medical Advance Directive? No No No No No Yes No  Type of Advance Directive - - - Event organiser -  Would patient like information on creating a medical advance directive? No - Patient declined - No - Patient declined No - Patient declined No - Patient declined - No - Patient declined    Current Medications (verified) Outpatient Encounter Medications as of 11/23/2020  Medication Sig  . albuterol (VENTOLIN HFA) 108 (90 Base) MCG/ACT inhaler Inhale 2 puffs into the lungs every 6 (six) hours as needed for wheezing or shortness of breath.  . Apple Cider Vinegar 600 MG CAPS   . aspirin 81 MG EC tablet Take 1 tablet (81 mg total) by mouth daily.  . [place]-Glucosamine-Vit D (OSTEO BI-FLEX ONE PER DAY PO) Take 1 tablet by mouth daily.  .  famotidine (PEPCID) 20 MG tablet Take 20 mg by mouth every other day. Alternates with omeprazole  . INS SYRINGE/NEEDLE 1CC/28G (B-D INS SYR MICROFINE 1CC/28G) 28G X 1/2" 1 ML MISC USE AS DIRECTED  . insulin NPH Human (NOVOLIN N) 100 UNIT/ML injection Inject 0.2 mLs (20 Units total) into the skin daily after supper.  . isosorbide mononitrate (IMDUR) 30 MG 24 hr tablet Take 0.5 tablets (15 mg total) by mouth daily.  Marland Kitchen levothyroxine (SYNTHROID) 100 MCG tablet TAKE 1 TABLET DAILY, AND ON2 DAYS A WEEK, TAKE AN     EXTRA 1/2 TABLET  . loratadine (CLARITIN) 10 MG tablet Take 1 tablet (10 mg total) by mouth daily.  . metFORMIN (GLUCOPHAGE) 500 MG tablet Take 1 tablet (500 mg total) by mouth 2 (two) times daily with a meal.  . Multiple Vitamin (MULTIVITAMIN WITH MINERALS) TABS tablet Take 1 tablet by mouth daily. One A Day for Women  . nystatin cream (MYCOSTATIN) APPLY TO AFFECTED AREA TWICE A DAY  . omeprazole (PRILOSEC) 20 MG capsule TAKE 1 DAILY-ALTERNATES WITH FAMOTIDINE  . ondansetron (ZOFRAN ODT) 4 MG disintegrating tablet Take 1 tablet by mouth every 4- 6 hours as needed  . ONE TOUCH LANCETS MISC Use to check sugar twice daily Dx: E11.8  . ONETOUCH ULTRA test strip USE TO CHECK BLOOD SUGAR 2 TIMES   DAILY  . OXYGEN Inhale 2 L into the lungs at bedtime.  . pioglitazone (ACTOS) 15 MG tablet Take 1 tablet (15 mg total) by mouth daily.  . Rhubarb (ESTROVEN COMPLETE PO) Take 1 tablet by  mouth daily.  . simvastatin (ZOCOR) 40 MG tablet TAKE 1 TABLET AT BEDTIME  . topiramate (TOPAMAX) 100 MG tablet Take 1 tablet (100 mg total) by mouth at bedtime.  Marland Kitchen umeclidinium-vilanterol (ANORO ELLIPTA) 62.5-25 MCG/INH AEPB Inhale 1 puff into the lungs daily.  . ziprasidone (GEODON) 40 MG capsule Take 1 capsule (40 mg total) by mouth in the morning and at bedtime.   No facility-administered encounter medications on file as of 11/23/2020.    Allergies (verified) Crestor [rosuvastatin], Lipitor [atorvastatin],  Lithium, and Quetiapine   History: Past Medical History:  Diagnosis Date  . Anxiety   . Arthritis    per patient "all over body"  . Bipolar disorder ([place])    has stopped seeing psychiatrist  . CAD (coronary artery disease)    a. 06/2020 MV: small, sev, apical inf/apical defect-scar vs ischemia. EF 45%; b. 06/2020 Cath: LM 58m, LAD 70ost, 22m, LCX nl, RCA nl; c. 07/2020 CABG x 1: LIMA->LAD.  Marland Kitchen Cataract   . Chronic bronchitis   . COPD (chronic obstructive pulmonary disease) ([place])   . Coronary artery disease, non-occlusive   . Depression   . Dyspnea 2012   s/p pulm/cards w/u WNL, thought due to obesity/deconditioning  . Fibromyalgia   . GERD (gastroesophageal reflux disease)   . HFmrEF (heart failure with mid-range ejection fraction) ([place])    a. 05/2020 Echo: EF 40%, glob HK, mild LVH, Gr1 DD; b. 06/2020 EF by SPECT: 45%; c. 07/2020 intraop TEE: EF 50%.  . History of chicken pox   . HLD (hyperlipidemia)   . Hypertension   . Hypothyroidism   . Ischemic cardiomyopathy    a. 05/2020 Echo: EF 40%; b. 06/2020 EF by SPECT: 45%; c. 07/2020 intraop TEE: EF 50%.  . Migraine   . OSA (obstructive sleep apnea) 03/16/2011  . Pneumonia   . Sleep apnea    per patient cannot tolerate CPAP  . T2DM (type 2 diabetes mellitus) ([place]) 2012   DM education 06/2011  . Urinary incontinence    Past Surgical History:  Procedure Laterality Date  . BREAST CYST ASPIRATION Right 2003  . cardiac catherization  03/2011   x3 [facility]), mild nonobstructive CAD  . COLONOSCOPY  12/2011   hyperplastic polyp x2,. diverticulosis, rec rpt 10 yrs  . CORONARY ARTERY BYPASS GRAFT N/A 08/03/2020   Procedure: CORONARY ARTERY BYPASS GRAFTING TIMES ONE USING LEFT INTERNAL MAMMARY ARTERY.;  Surgeon: Gaye Pollack, MD;  Location: [facility];  Service: Open Heart Surgery;  Laterality: N/A;  . ESOPHAGOGASTRODUODENOSCOPY  2006  . ESOPHAGOGASTRODUODENOSCOPY  05/2020   no esophageal abnormality to explain dysphagia - esophagus  dilated. Erosive gastropathy - neg H pylori [facility])  . KNEE SURGERY  2006   left, torn meniscus  . RIGHT/LEFT HEART CATH AND CORONARY ANGIOGRAPHY N/A 07/13/2020   Procedure: RIGHT/LEFT HEART CATH AND CORONARY ANGIOGRAPHY;  Surgeon: Nelva Bush, MD;  Location: [facility];  Service: Cardiovascular;  Laterality: N/A;  . TEE WITHOUT CARDIOVERSION N/A 08/03/2020   Procedure: TRANSESOPHAGEAL ECHOCARDIOGRAM (TEE);  Surgeon: Gaye Pollack, MD;  Location: [address];  Service: Open Heart Surgery;  Laterality: N/A;  . TOTAL ABDOMINAL HYSTERECTOMY  2004   fibroids, cervix remained  . TOTAL KNEE ARTHROPLASTY Left 08/2012   [name]   Family History  Problem Relation Age of Onset  . Asthma Maternal Uncle   . Cancer Maternal Uncle        colon  . Aneurysm Maternal Uncle   . Colon cancer  Maternal Uncle 60  . Hypertension Mother   . Stroke Mother   . Arthritis Mother   . COPD Sister   . Acute lymphoblastic leukemia Cousin 3       died at age 43  . Cancer Father 68       brain tumor  . Diabetes Father   . Thyroid disease Father   . Cancer Maternal Aunt 67       breast  . Breast cancer Maternal Aunt   . Cancer Paternal Uncle        prostate  . Coronary artery disease Neg Hx   . Stomach cancer Neg Hx   . Rectal cancer Neg Hx   . Esophageal cancer Neg Hx    Social History   Socioeconomic History  . Marital status: Married    Spouse name: Not on file  . Number of children: Not on file  . Years of education: Not on file  . Highest education level: Not on file  Occupational History  . Occupation: disabled    Employer: OTHER  Tobacco Use  . Smoking status: Former Smoker    Packs/day: 2.00    Years: 43.00    Pack years: 86.00    Types: Cigarettes    Quit date: 05/17/2010    Years since quitting: 10.5  . Smokeless tobacco: Never Used  . Tobacco comment: QUIT 2011  Vaping Use  . Vaping Use: Never used  Substance and Sexual Activity  . Alcohol use: No     Alcohol/week: 0.0 standard drinks  . Drug use: No  . Sexual activity: Yes    Partners: Male  Other Topics Concern  . Not on file  Social History Narrative   Caffeine: 2-3 coffee/am, 1 cup tea in afternoon; Lives with husband, 1 dog, 2 cats; Occupation: disability since 2009 for bipolar, previously worked at [employer]; Diet: not many fruits, good vegetables, good amt water.      Smoker 43 years; quit in 2011. No alcohol. Lives in [place]; with husband.    Social Determinants of Health   Financial Resource Strain: Low Risk   . Difficulty of Paying Living Expenses: Not hard at all  Food Insecurity: No Food Insecurity  . Worried About Charity fundraiser in the Last Year: Never true  . Ran Out of Food in the Last Year: Never true  Transportation Needs: No Transportation Needs  . Lack of Transportation (Medical): No  . Lack of Transportation (Non-Medical): No  Physical Activity: Inactive  . Days of Exercise per Week: 0 days  . Minutes of Exercise per Session: 0 min  Stress: No Stress Concern Present  . Feeling of Stress : Not at all  Social Connections: Not on file    Tobacco Counseling Counseling given: Not Answered Comment: QUIT 2011   Clinical Intake:  Pre-visit preparation completed: Yes  Pain : No/denies pain     Nutritional Risks: Nausea/ vomitting/ diarrhea (nausea with certain foods) Diabetes: Yes CBG done?: No Did pt. bring in CBG monitor from home?: No  How often do you need to have someone help you when you read instructions, pamphlets, or other written materials from your doctor or pharmacy?: 1 - Never What is the last grade level you completed in school?: 12th  Diabetic: Yes Nutrition Risk Assessment:  Has the patient had any N/V/D within the last 2 months?  Yes, nausea with certain foods Does the patient have any non-healing wounds?  No  Has the patient had any unintentional  weight loss or weight gain?  No   Diabetes:  Is the patient diabetic?  Yes   If diabetic, was a CBG obtained today?  No  telephone visit Did the patient bring in their glucometer from home?  No  telephone visit  How often do you monitor your CBG's? daily.   Financial Strains and Diabetes Management:  Are you having any financial strains with the device, your supplies or your medication? No .  Does the patient want to be seen by Chronic Care Management for management of their diabetes?  No  Would the patient like to be referred to a Nutritionist or for Diabetic Management?  No   Diabetic Exams:  Diabetic Eye Exam: Completed 07/08/2020 Diabetic Foot Exam: Completed 03/22/2020   Interpreter Needed?: No  Information entered by :: CJohnson, LPN   Activities of Daily Living In your present state of health, do you have any difficulty performing the following activities: 11/23/2020 08/04/2020  Hearing? N N  Vision? N N  Difficulty concentrating or making decisions? N N  Walking or climbing stairs? N Y  Comment - -  Dressing or bathing? N N  Doing errands, shopping? N N  Preparing Food and eating ? N -  Using the Toilet? N -  In the past six months, have you accidently leaked urine? N -  Do you have problems with loss of bowel control? N -  Managing your Medications? N -  Managing your Finances? N -  Housekeeping or managing your Housekeeping? N -  Some recent data might be hidden    Patient Care Team: Ria Bush, MD as PCP - General (Family Medicine) Kate Sable, MD as PCP - Cardiology (Cardiology) Bryson Ha, OD as Consulting Physician (Optometry)  Indicate any recent Medical Services you may have received from other than [facility] providers in the past year (date may be approximate).     Assessment:   This is a routine wellness examination for [name].  Hearing/Vision screen  Hearing Screening   125Hz  250Hz  500Hz  1000Hz  2000Hz  3000Hz  4000Hz  6000Hz  8000Hz   Right ear:           Left ear:           Vision Screening Comments: Patient  gets annual eye exams   Dietary issues and exercise activities discussed: Current Exercise Habits: The patient does not participate in regular exercise at present  Goals    . Increase water intake     Starting 04/15/2018, I will continue to drink at least 6-8 glasses of water daily.     . Patient Stated     11/23/2020, I will maintain and continue medications as prescribed.       Depression Screen PHQ 2/9 Scores 11/23/2020 10/11/2020 05/26/2020 05/26/2020 04/24/2019 04/15/2018 04/10/2017  PHQ - 2 Score 0 1 0 0 0 0 0  PHQ- 9 Score 0 5 0 - - 0 6    Fall Risk Fall Risk  11/23/2020 09/01/2020 05/26/2020 04/24/2019 04/15/2018  Falls in the past year? 1 0 0 0 No  Number falls in past yr: 0 - - - -  Injury with Fall? 0 - - - -  Risk for fall due to : Medication side effect Impaired mobility - - -  Follow up Falls evaluation completed;Falls prevention discussed Falls prevention discussed;Education provided - - -    FALL RISK PREVENTION PERTAINING TO THE HOME:  Any stairs in or around the home? Yes  If so, are there any without handrails? No  Home free of loose throw rugs in walkways, pet beds, electrical cords, etc? Yes  Adequate lighting in your home to reduce risk of falls? Yes   ASSISTIVE DEVICES UTILIZED TO PREVENT FALLS:  Life alert? No  Use of a cane, walker or w/c? Yes, cane, walker Grab bars in the bathroom? No  Shower chair or bench in shower? No  Elevated toilet seat or a handicapped toilet? No   TIMED UP AND GO:  Was the test performed? N/A telephone visit .   Cognitive Function: MMSE - Mini Mental State Exam 11/23/2020 04/15/2018 04/10/2017  Orientation to time 5 5 5   Orientation to Place 5 5 5   Registration 3 3 3   Attention/ Calculation 5 0 0  Recall 3 3 1   Recall-comments - - pt was unable to recall 1 of 3 words  Language- name 2 objects - 0 0  Language- repeat 1 1 1   Language- follow 3 step command - 3 3  Language- read & follow direction - 0 0  Write a sentence - 0 0   Copy design - 0 0  Total score - 20 18  Mini Cog  Mini-Cog screen was completed. Maximum score is 22. A value of 0 denotes this part of the MMSE was not completed or the patient failed this part of the Mini-Cog screening.       Immunizations Immunization History  Administered Date(s) Administered  . Fluad Quad(high Dose 65+) 04/24/2019, 05/26/2020  . Influenza Split 05/09/2011, 06/11/2012  . Influenza Whole 05/20/2010  . Influenza,inj,Quad PF,6+ Mos 07/14/2013, 06/16/2014, 04/30/2015, 05/31/2016, 05/10/2017, 04/19/2018  . Moderna SARS-COV2 Booster Vaccination 06/21/2020  . Moderna Sars-Covid-2 Vaccination 10/18/2019, 11/15/2019  . Pneumococcal Conjugate-13 11/14/2017, 04/19/2018  . Pneumococcal Polysaccharide-23 04/18/2011  . Td 04/18/2011  . Zoster 12/29/2013  . Zoster Recombinat (Shingrix) 11/14/2017, 01/02/2018    TDAP status: Up to date  Flu Vaccine status: Up to date  Pneumococcal vaccine status: Due, Education has been provided regarding the importance of this vaccine. Advised may receive this vaccine at local pharmacy or Health Dept. Aware to provide a copy of the vaccination record if obtained from local pharmacy or Health Dept. Verbalized acceptance and understanding.  Covid-19 vaccine status: Completed vaccines  Qualifies for Shingles Vaccine? Yes   Zostavax completed Yes   Shingrix Completed?: Yes  Screening Tests Health Maintenance  Topic Date Due  . URINE MICROALBUMIN  01/15/2016  . DEXA SCAN  Never done  . PNA vac Low Risk Adult (2 of 2 - PPSV23) 04/20/2019  . COVID-19 Vaccine (4 - Booster for Moderna series) 12/19/2020  . HEMOGLOBIN A1C  01/28/2021  . INFLUENZA VACCINE  03/21/2021  . FOOT EXAM  03/22/2021  . TETANUS/TDAP  04/17/2021  . MAMMOGRAM  07/06/2021  . OPHTHALMOLOGY EXAM  07/08/2021  . COLONOSCOPY (Pts 45-71yrs Insurance coverage will need to be confirmed)  01/09/2022  . Hepatitis C Screening  Completed  . HPV VACCINES  Aged Out     Health Maintenance  Health Maintenance Due  Topic Date Due  . URINE MICROALBUMIN  01/15/2016  . DEXA SCAN  Never done  . PNA vac Low Risk Adult (2 of 2 - PPSV23) 04/20/2019    Colorectal cancer screening: Type of screening: Colonoscopy. Completed 01/10/2012. Repeat every 10 years  Mammogram status: scheduled 12/10/2020  Bone Density status: Ordered 05/27/2020. Pt provided with contact info and advised to call to schedule appt.  Lung Cancer Screening: (Low Dose CT Chest recommended if Age 75-80 years, 30 pack-year currently  smoking OR have quit w/in 15 years.) does not qualify.   Additional Screening:  Hepatitis C Screening: does qualify; Completed 07/30/2015  Vision Screening: Recommended annual ophthalmology exams for early detection of glaucoma and other disorders of the eye. Is the patient up to date with their annual eye exam?  Yes  Who is the provider or what is the name of the office in which the patient attends annual eye exams? [facility] If pt is not established with a provider, would they like to be referred to a provider to establish care? No .   Dental Screening: Recommended annual dental exams for proper oral hygiene  Community Resource Referral / Chronic Care Management: CRR required this visit?  No   CCM required this visit?  No      Plan:     I have personally reviewed and noted the following in the patient's chart:   . Medical and social history . Use of alcohol, tobacco or illicit drugs  . Current medications and supplements . Functional ability and status . Nutritional status . Physical activity . Advanced directives . List of other physicians . Hospitalizations, surgeries, and ER visits in previous 12 months . Vitals . Screenings to include cognitive, depression, and falls . Referrals and appointments  In addition, I have reviewed and discussed with patient certain preventive protocols, quality metrics, and best practice  recommendations. A written personalized care plan for preventive services as well as general preventive health recommendations were provided to patient.   Due to this being a telephonic visit, the after visit summary with patients personalized plan was offered to patient via office or my-chart. Patient preferred to pick up at office at next visit or via mychart.   Andrez Grime, LPN   04/27/262

## 2020-11-23 NOTE — Progress Notes (Signed)
PCP notes:  Health Maintenance: Pneumococcal 23- due Dexa- due    Abnormal Screenings: none   Patient concerns: none   Nurse concerns: none   Next PCP appt.: 12/27/2020 @ 11 am

## 2020-11-24 ENCOUNTER — Telehealth: Payer: Self-pay

## 2020-11-24 DIAGNOSIS — Z951 Presence of aortocoronary bypass graft: Secondary | ICD-10-CM

## 2020-11-24 NOTE — Telephone Encounter (Signed)
Spoke with patient- last time we spoke she wanted to wait until she completed PT to start rehab, however, she recently had heart issues and is currently undergoing testing. Patient would like to be discharged from Cardiac Rehab at this time. I advised her to get a new referral from her cardiologist when she finishes PT and cleared to do so, and also if/when it is warranted as appropriate. Patients understood.

## 2020-11-25 ENCOUNTER — Other Ambulatory Visit: Payer: Self-pay | Admitting: Family Medicine

## 2020-11-25 DIAGNOSIS — G43909 Migraine, unspecified, not intractable, without status migrainosus: Secondary | ICD-10-CM

## 2020-11-25 NOTE — Progress Notes (Signed)
Cardiac Individual Treatment Plan  Patient Details  Name: [name] MRN: [id number] Date of Birth: 11/20/1950 Referring Provider:   Flowsheet Row Cardiac Rehab from 10/11/2020 in [facility] Cardiac and Pulmonary Rehab  Referring Provider End      Initial Encounter Date:  Flowsheet Row Cardiac Rehab from 10/11/2020 in [facility] Cardiac and Pulmonary Rehab  Date 10/11/20      Visit Diagnosis: S/P CABG x 1  Patient's Home Medications on Admission:  Current Outpatient Medications:  .  albuterol (VENTOLIN HFA) 108 (90 Base) MCG/ACT inhaler, Inhale 2 puffs into the lungs every 6 (six) hours as needed for wheezing or shortness of breath., Disp: 8 g, Rfl: 3 .  Apple Cider Vinegar 600 MG CAPS, , Disp: , Rfl:  .  aspirin 81 MG EC tablet, Take 1 tablet (81 mg total) by mouth daily., Disp: , Rfl:  .  Boswellia-Glucosamine-Vit D (OSTEO BI-FLEX ONE PER DAY PO), Take 1 tablet by mouth daily., Disp: , Rfl:  .  famotidine (PEPCID) 20 MG tablet, Take 20 mg by mouth every other day. Alternates with omeprazole, Disp: , Rfl:  .  INS SYRINGE/NEEDLE 1CC/28G (B-D INS SYR MICROFINE 1CC/28G) 28G X 1/2" 1 ML MISC, USE AS DIRECTED, Disp: 100 each, Rfl: 3 .  insulin NPH Human (NOVOLIN N) 100 UNIT/ML injection, Inject 0.2 mLs (20 Units total) into the skin daily after supper., Disp: , Rfl:  .  isosorbide mononitrate (IMDUR) 30 MG 24 hr tablet, Take 0.5 tablets (15 mg total) by mouth daily., Disp: 15 tablet, Rfl: 1 .  levothyroxine (SYNTHROID) 100 MCG tablet, TAKE 1 TABLET DAILY, AND ON2 DAYS A WEEK, TAKE AN     EXTRA 1/2 TABLET, Disp: 90 tablet, Rfl: 3 .  loratadine (CLARITIN) 10 MG tablet, Take 1 tablet (10 mg total) by mouth daily., Disp: , Rfl:  .  metFORMIN (GLUCOPHAGE) 500 MG tablet, Take 1 tablet (500 mg total) by mouth 2 (two) times daily with a meal., Disp: 180 tablet, Rfl: 1 .  Multiple Vitamin (MULTIVITAMIN WITH MINERALS) TABS tablet, Take 1 tablet by mouth daily. One A Day for Women, Disp: , Rfl:  .  nystatin  cream (MYCOSTATIN), APPLY TO AFFECTED AREA TWICE A DAY, Disp: 30 g, Rfl: 1 .  omeprazole (PRILOSEC) 20 MG capsule, TAKE 1 DAILY-ALTERNATES WITH FAMOTIDINE, Disp: 90 capsule, Rfl: 1 .  ondansetron (ZOFRAN ODT) 4 MG disintegrating tablet, Take 1 tablet by mouth every 4- 6 hours as needed, Disp: 30 tablet, Rfl: 3 .  ONE TOUCH LANCETS MISC, Use to check sugar twice daily Dx: E11.8, Disp: 200 each, Rfl: 3 .  ONETOUCH ULTRA test strip, USE TO CHECK BLOOD SUGAR 2 TIMES   DAILY, Disp: 200 strip, Rfl: 3 .  OXYGEN, Inhale 2 L into the lungs at bedtime., Disp: , Rfl:  .  pioglitazone (ACTOS) 15 MG tablet, Take 1 tablet (15 mg total) by mouth daily., Disp: 90 tablet, Rfl: 1 .  Rhubarb (ESTROVEN COMPLETE PO), Take 1 tablet by mouth daily., Disp: , Rfl:  .  simvastatin (ZOCOR) 40 MG tablet, TAKE 1 TABLET AT BEDTIME, Disp: 90 tablet, Rfl: 3 .  topiramate (TOPAMAX) 100 MG tablet, Take 1 tablet (100 mg total) by mouth at bedtime., Disp: 90 tablet, Rfl: 1 .  umeclidinium-vilanterol (ANORO ELLIPTA) 62.5-25 MCG/INH AEPB, Inhale 1 puff into the lungs daily., Disp: 60 each, Rfl: 5 .  ziprasidone (GEODON) 40 MG capsule, Take 1 capsule (40 mg total) by mouth in the morning and at bedtime., Disp: ,  Rfl:   Past Medical History: Past Medical History:  Diagnosis Date  . Anxiety   . Arthritis    per patient "all over body"  . Bipolar disorder ([place])    has stopped seeing psychiatrist  . CAD (coronary artery disease)    a. 06/2020 MV: small, sev, apical inf/apical defect-scar vs ischemia. EF 45%; b. 06/2020 Cath: LM 41m LAD 70ost, 969mLCX nl, RCA nl; c. 07/2020 CABG x 1: LIMA->LAD.  . Marland Kitchenataract   . Chronic bronchitis   . COPD (chronic obstructive pulmonary disease) ([facility]  . Coronary artery disease, non-occlusive   . Depression   . Dyspnea 2012   s/p pulm/cards w/u WNL, thought due to obesity/deconditioning  . Fibromyalgia   . GERD (gastroesophageal reflux disease)   . HFmrEF (heart failure with mid-range ejection  fraction) ([facility]   a. 05/2020 Echo: EF 40%, glob HK, mild LVH, Gr1 DD; b. 06/2020 EF by SPECT: 45%; c. 07/2020 intraop TEE: EF 50%.  . History of chicken pox   . HLD (hyperlipidemia)   . Hypertension   . Hypothyroidism   . Ischemic cardiomyopathy    a. 05/2020 Echo: EF 40%; b. 06/2020 EF by SPECT: 45%; c. 07/2020 intraop TEE: EF 50%.  . Migraine   . OSA (obstructive sleep apnea) 03/16/2011  . Pneumonia   . Sleep apnea    per patient cannot tolerate CPAP  . T2DM (type 2 diabetes mellitus) ([facility]2012   DM education 06/2011  . Urinary incontinence     Tobacco Use: Social History   Tobacco Use  Smoking Status Former Smoker  . Packs/day: 2.00  . Years: 43.00  . Pack years: 86.00  . Types: Cigarettes  . Quit date: 05/17/2010  . Years since quitting: 10.5  Smokeless Tobacco Never Used  Tobacco Comment   QUIT 2011    Labs: Recent Review Flowsheet Data    Labs for ITP Cardiac and Pulmonary Rehab Latest Ref Rng & Units 08/03/2020 08/03/2020 08/03/2020 08/03/2020 08/03/2020   Cholestrol 0 - 200 mg/dL - - - - -   LDLCALC 0 - 99 mg/dL - - - - -   LDLDIRECT mg/dL - - - - -   HDL >39.00 mg/dL - - - - -   Trlycerides 0.0 - 149.0 mg/dL - - - - -   Hemoglobin A1c 4.8 - 5.6 % - - - - -   PHART 7.350 - 7.450 7.385 - 7.230(L) 7.388 7.369   PCO2ART 32.0 - 48.0 mmHg 41.3 - 57.9(H) 36.4 40.1   HCO3 20.0 - 28.0 mmol/L 24.7 - 24.7 22.0 23.1   TCO2 22 - 32 mmol/L _0 ACIDBASEDEF 0.0 - 2.0 mmol/L - - 4.0(H) 3.0(H) 2.0   O2SAT % 100.0 - 98.0 99.0 99.0       Exercise Target Goals: Exercise Program Goal: Individual exercise prescription set using results from initial 6 min walk test and THRR while considering  patient's activity barriers and safety.   Exercise Prescription Goal: Initial exercise prescription builds to 30-45 minutes a day of aerobic activity, 2-3 days per week.  Home exercise guidelines will be given to patient during program as part of exercise prescription that  the participant will acknowledge.   Education: Aerobic Exercise: - Group verbal and visual presentation on the components of exercise prescription. Introduces F.I.T.T principle from ACSM for exercise prescriptions.  Reviews F.I.T.T. principles of aerobic exercise including progression. Written material given at graduation.   Education: Resistance Exercise: -  Group verbal and visual presentation on the components of exercise prescription. Introduces F.I.T.T principle from ACSM for exercise prescriptions  Reviews F.I.T.T. principles of resistance exercise including progression. Written material given at graduation.    Education: Exercise & Equipment Safety: - Individual verbal instruction and demonstration of equipment use and safety with use of the equipment. Flowsheet Row Cardiac Rehab from 10/11/2020 in [facility] Cardiac and Pulmonary Rehab  Date 10/11/20  Educator AS  Instruction Review Code 1- Verbalizes Understanding      Education: Exercise Physiology & General Exercise Guidelines: - Group verbal and written instruction with models to review the exercise physiology of the cardiovascular system and associated critical values. Provides general exercise guidelines with specific guidelines to those with heart or lung disease.    Education: Flexibility, Balance, Mind/Body Relaxation: - Group verbal and visual presentation with interactive activity on the components of exercise prescription. Introduces F.I.T.T principle from ACSM for exercise prescriptions. Reviews F.I.T.T. principles of flexibility and balance exercise training including progression. Also discusses the mind body connection.  Reviews various relaxation techniques to help reduce and manage stress (i.e. Deep breathing, progressive muscle relaxation, and visualization). Balance handout provided to take home. Written material given at graduation.   Activity Barriers & Risk Stratification:  Activity Barriers & Cardiac Risk  Stratification - 09/01/20 1538      Activity Barriers & Cardiac Risk Stratification   Activity Barriers Shortness of Breath;Joint Problems   bad right knee, breathing problems   Cardiac Risk Stratification High           6 Minute Walk:  6 Minute Walk    Row Name 10/11/20 1545         6 Minute Walk   Phase Initial     Distance 265 feet     Walk Time 4 minutes     # of Rest Breaks 2     MPH 0.75     METS 0.5     RPE 15     Perceived Dyspnea  3     VO2 Peak 1.73     Symptoms Yes (comment)     Comments SOB, bak and knee pain 6/10     Resting HR 75 bpm     Resting BP 126/60     Resting Oxygen Saturation  99 %     Exercise Oxygen Saturation  during 6 min walk 100 %     Max Ex. HR 98 bpm     Max Ex. BP 132/66     2 Minute Post BP 124/64            Oxygen Initial Assessment:  Oxygen Initial Assessment - 09/01/20 1541      Home Oxygen   Home Oxygen Device Home Concentrator;E-Tanks    Sleep Oxygen Prescription Continuous    Liters per minute 3    Home Exercise Oxygen Prescription None    Home Resting Oxygen Prescription None    Compliance with Home Oxygen Use Yes      Intervention   Short Term Goals To learn and understand importance of monitoring SPO2 with pulse oximeter and demonstrate accurate use of the pulse oximeter.;To learn and understand importance of maintaining oxygen saturations>88%;To learn and demonstrate proper pursed lip breathing techniques or other breathing techniques.;To learn and demonstrate proper use of respiratory medications    Long  Term Goals Exhibits compliance with exercise, home and travel O2 prescription;Verbalizes importance of monitoring SPO2 with pulse oximeter and return demonstration;Maintenance of O2 saturations>88%;Exhibits proper breathing techniques,  such as pursed lip breathing or other method taught during program session;Compliance with respiratory medication;Demonstrates proper use of MDI's           Oxygen  Re-Evaluation:  Oxygen Re-Evaluation    Row Name 10/18/20 1350             Home Oxygen   Home Oxygen Device Home Concentrator;E-Tanks       Sleep Oxygen Prescription Continuous       Liters per minute 3       Home Exercise Oxygen Prescription None       Home Resting Oxygen Prescription None       Compliance with Home Oxygen Use Yes               Goals/Expected Outcomes   Short Term Goals To learn and understand importance of monitoring SPO2 with pulse oximeter and demonstrate accurate use of the pulse oximeter.;To learn and understand importance of maintaining oxygen saturations>88%;To learn and demonstrate proper pursed lip breathing techniques or other breathing techniques.       Long  Term Goals Exhibits compliance with exercise, home and travel O2 prescription;Verbalizes importance of monitoring SPO2 with pulse oximeter and return demonstration;Maintenance of O2 saturations>88%;Exhibits proper breathing techniques, such as pursed lip breathing or other method taught during program session       Comments Reviewed PLB technique with pt.  Talked about how it works and it's importance in maintaining their exercise saturations.       Goals/Expected Outcomes Short: Become more profiecient at using PLB.   Long: Become independent at using PLB.              Oxygen Discharge (Final Oxygen Re-Evaluation):  Oxygen Re-Evaluation - 10/18/20 1350      Home Oxygen   Home Oxygen Device Home Concentrator;E-Tanks    Sleep Oxygen Prescription Continuous    Liters per minute 3    Home Exercise Oxygen Prescription None    Home Resting Oxygen Prescription None    Compliance with Home Oxygen Use Yes      Goals/Expected Outcomes   Short Term Goals To learn and understand importance of monitoring SPO2 with pulse oximeter and demonstrate accurate use of the pulse oximeter.;To learn and understand importance of maintaining oxygen saturations>88%;To learn and demonstrate proper pursed lip breathing  techniques or other breathing techniques.    Long  Term Goals Exhibits compliance with exercise, home and travel O2 prescription;Verbalizes importance of monitoring SPO2 with pulse oximeter and return demonstration;Maintenance of O2 saturations>88%;Exhibits proper breathing techniques, such as pursed lip breathing or other method taught during program session    Comments Reviewed PLB technique with pt.  Talked about how it works and it's importance in maintaining their exercise saturations.    Goals/Expected Outcomes Short: Become more profiecient at using PLB.   Long: Become independent at using PLB.           Initial Exercise Prescription:  Initial Exercise Prescription - 10/11/20 1600      Date of Initial Exercise RX and Referring Provider   Date 10/11/20    Referring Provider End      Treadmill   MPH 0.7    Grade 0    Minutes 15      NuStep   Level 1    SPM 80    Minutes 15    METs 1      T5 Nustep   Level 1    SPM 80    Minutes 15  METs 1      Biostep-RELP   Level 1    SPM 50    Minutes 15    METs 1      Prescription Details   Frequency (times per week) 3    Duration Progress to 30 minutes of continuous aerobic without signs/symptoms of physical distress      Intensity   THRR 40-80% of Max Heartrate 106-137    Ratings of Perceived Exertion 11-13    Perceived Dyspnea 0-4      Resistance Training   Training Prescription Yes    Weight 3 lb    Reps 10-15           Perform Capillary Blood Glucose checks as needed.  Exercise Prescription Changes:  Exercise Prescription Changes    Row Name 10/11/20 1600 10/27/20 0700           Response to Exercise   Blood Pressure (Admit) 126/60 124/72      Blood Pressure (Exercise) 132/66 124/68      Blood Pressure (Exit) 124/64 122/62      Heart Rate (Admit) 75 bpm 104 bpm      Heart Rate (Exercise) 98 bpm 111 bpm      Heart Rate (Exit) 95 bpm 100 bpm      Oxygen Saturation (Admit) 99 % --      Oxygen  Saturation (Exercise) 100 % --      Rating of Perceived Exertion (Exercise) 15 13      Perceived Dyspnea (Exercise) 3 --      Symptoms back,knee pain back,knee pain      Comments -- first full day of exercise      Duration -- Progress to 30 minutes of  aerobic without signs/symptoms of physical distress      Intensity -- THRR unchanged             Progression   Progression -- Continue to progress workloads to maintain intensity without signs/symptoms of physical distress.      Average METs -- 1.2             Resistance Training   Training Prescription -- Yes      Weight -- 3 lb      Reps -- 10-15             Interval Training   Interval Training -- No             NuStep   Level -- 1      Minutes -- 15      METs -- 1.4             Biostep-RELP   Level -- 1      Minutes -- 15      METs -- 1             Exercise Comments:  Exercise Comments    Row Name 10/18/20 1348 10/20/20 0908         Exercise Comments First full day of exercise!  Patient was oriented to gym and equipment including functions, settings, policies, and procedures.  Patient's individual exercise prescription and treatment plan were reviewed.  All starting workloads were established based on the results of the 6 minute walk test done at initial orientation visit.  The plan for exercise progression was also introduced and progression will be customized based on patient's performance and goals. Reghan called to cancel.  She is very sore and in pain since Monday.  Her knees are  really bothering her and she can barely walk.  She has been taking tramadol for them. She got set up for an appt with [facility] on Monday at 11am to see if they can help.  She wanted to stop altogether but we talked about why she was hurting;   DOMs, new to exercise and ways to help combat with ice.  We decided to hold off on the rest of the week to allow her to recover and ice.  She is going to call us after her appointment on Monday to  see what they say at office.  We talked about slowing down some when she returns and taking more rest breaks.             Exercise Goals and Review:  Exercise Goals    Row Name 10/11/20 1606             Exercise Goals   Increase Physical Activity Yes       Intervention Provide advice, education, support and counseling about physical activity/exercise needs.;Develop an individualized exercise prescription for aerobic and resistive training based on initial evaluation findings, risk stratification, comorbidities and participant's personal goals.       Expected Outcomes Short Term: Attend rehab on a regular basis to increase amount of physical activity.;Long Term: Add in home exercise to make exercise part of routine and to increase amount of physical activity.;Long Term: Exercising regularly at least 3-5 days a week.       Increase Strength and Stamina Yes       Intervention Provide advice, education, support and counseling about physical activity/exercise needs.;Develop an individualized exercise prescription for aerobic and resistive training based on initial evaluation findings, risk stratification, comorbidities and participant's personal goals.       Expected Outcomes Short Term: Increase workloads from initial exercise prescription for resistance, speed, and METs.;Short Term: Perform resistance training exercises routinely during rehab and add in resistance training at home;Long Term: Improve cardiorespiratory fitness, muscular endurance and strength as measured by increased METs and functional capacity (6MWT)       Able to understand and use rate of perceived exertion (RPE) scale Yes       Intervention Provide education and explanation on how to use RPE scale       Expected Outcomes Short Term: Able to use RPE daily in rehab to express subjective intensity level;Long Term:  Able to use RPE to guide intensity level when exercising independently       Able to understand and use Dyspnea  scale Yes       Intervention Provide education and explanation on how to use Dyspnea scale       Expected Outcomes Short Term: Able to use Dyspnea scale daily in rehab to express subjective sense of shortness of breath during exertion;Long Term: Able to use Dyspnea scale to guide intensity level when exercising independently       Knowledge and understanding of Target Heart Rate Range (THRR) Yes       Intervention Provide education and explanation of THRR including how the numbers were predicted and where they are located for reference       Expected Outcomes Short Term: Able to state/look up THRR;Short Term: Able to use daily as guideline for intensity in rehab;Long Term: Able to use THRR to govern intensity when exercising independently       Able to check pulse independently Yes       Intervention Provide education and demonstration on how to check  pulse in carotid and radial arteries.;Review the importance of being able to check your own pulse for safety during independent exercise       Expected Outcomes Short Term: Able to explain why pulse checking is important during independent exercise;Long Term: Able to check pulse independently and accurately       Understanding of Exercise Prescription Yes       Intervention Provide education, explanation, and written materials on patient's individual exercise prescription       Expected Outcomes Short Term: Able to explain program exercise prescription;Long Term: Able to explain home exercise prescription to exercise independently              Exercise Goals Re-Evaluation :  Exercise Goals Re-Evaluation    [place] Name 10/18/20 1348 10/25/20 1506           Exercise Goal Re-Evaluation   Exercise Goals Review Increase Physical Activity;Able to understand and use rate of perceived exertion (RPE) scale;Knowledge and understanding of Target Heart Rate Range (THRR);Understanding of Exercise Prescription;Increase Strength and Stamina;Able to understand  and use Dyspnea scale;Able to check pulse independently Increase Physical Activity;Increase Strength and Stamina;Understanding of Exercise Prescription      Comments Reviewed RPE and dyspnea scales, THR and program prescription with pt today.  Pt voiced understanding and was given a copy of goals to take home. Only attend first day.  Now dealing with knee pain issues.      Expected Outcomes Short: Use RPE daily to regulate intensity. Long: Follow program prescription in THR. Find out more about PT and ability to do rehab too.             Discharge Exercise Prescription (Final Exercise Prescription Changes):  Exercise Prescription Changes - 10/27/20 0700      Response to Exercise   Blood Pressure (Admit) 124/72    Blood Pressure (Exercise) 124/68    Blood Pressure (Exit) 122/62    Heart Rate (Admit) 104 bpm    Heart Rate (Exercise) 111 bpm    Heart Rate (Exit) 100 bpm    Rating of Perceived Exertion (Exercise) 13    Symptoms back,knee pain    Comments first full day of exercise    Duration Progress to 30 minutes of  aerobic without signs/symptoms of physical distress    Intensity THRR unchanged      Progression   Progression Continue to progress workloads to maintain intensity without signs/symptoms of physical distress.    Average METs 1.2      Resistance Training   Training Prescription Yes    Weight 3 lb    Reps 10-15      Interval Training   Interval Training No      NuStep   Level 1    Minutes 15    METs 1.4      Biostep-RELP   Level 1    Minutes 15    METs 1           Nutrition:  Target Goals: Understanding of nutrition guidelines, daily intake of sodium <1562m, cholesterol <2041m calories 30% from fat and 7% or less from saturated fats, daily to have 5 or more servings of fruits and vegetables.  Education: All About Nutrition: -Group instruction provided by verbal, written material, interactive activities, discussions, models, and posters to present  general guidelines for heart healthy nutrition including fat, fiber, MyPlate, the role of sodium in heart healthy nutrition, utilization of the nutrition label, and utilization of this knowledge for meal planning. Follow  up email sent as well. Written material given at graduation.   Biometrics:  Pre Biometrics - 10/11/20 1611      Pre Biometrics   Height _0  (1.575 m)    Weight 216 lb 6.4 oz (98.2 kg)    BMI (Calculated) 39.57            Nutrition Therapy Plan and Nutrition Goals:  Nutrition Therapy & Goals - 10/20/20 1017      Nutrition Therapy   Diet heart healthy, low Na, diabetes friendly    Protein (specify units) 80g    Fiber 25 grams    Whole Grain Foods 3 servings    Saturated Fats 12 max. grams    Fruits and Vegetables 8 servings/day    Sodium 1.5 grams      Personal Nutrition Goals   Nutrition Goal ST: try oatmeal, meet with gastroenterologist LT: pending (will see what MD says)    Comments She reports not feeling like she eats the best. B: bowl of cheerios (powdered milk with splenda), breakfast bowl (sausage, egg, and potatoes), can of sliced potatoes fried. Cup of hot chocolate, cup of tea - used to drink a pot of coffee. L: subway - soup (broccoli and cheese soup with crackers), [employer] - filet of fish, chinese food - shrimp with broccoli (small amount of rice). D: half of cube steak with small amount of mashed potatoes with some green beans. Also eats chicken, salad, stirfry, keilbasa and mac and cheese. She doesn't like the same things as her husband and feels this is a big problem. She reports not digesting food well anymore and gets nauseous - she has lost 25-30lbs since December. Her trigger foods include pasta, mashed potatoes, bread - most carbohydrates. She is going to see a gastroenterologist recommended by her cardiologist. A1C 5.9 - lowered insulin after dinner. Discussed heart healthy eating and diabetes friendly eating. Will not make any big changes until  she meets with gastroenterologist.      Intervention Plan   Intervention Prescribe, educate and counsel regarding individualized specific dietary modifications aiming towards targeted core components such as weight, hypertension, lipid management, diabetes, heart failure and other comorbidities.;Nutrition handout(s) given to patient.    Expected Outcomes Short Term Goal: Understand basic principles of dietary content, such as calories, fat, sodium, cholesterol and nutrients.;Short Term Goal: A plan has been developed with personal nutrition goals set during dietitian appointment.;Long Term Goal: Adherence to prescribed nutrition plan.           Nutrition Assessments:  MEDIFICTS Score Key:  ?70 Need to make dietary changes   40-70 Heart Healthy Diet  ? 40 Therapeutic Level Cholesterol Diet  Flowsheet Row Cardiac Rehab from 10/11/2020 in [facility] Cardiac and Pulmonary Rehab  Picture Your Plate Total Score on Admission 51     Picture Your Plate Scores:  <01 Unhealthy dietary pattern with much room for improvement.  41-50 Dietary pattern unlikely to meet recommendations for good health and room for improvement.  51-60 More healthful dietary pattern, with some room for improvement.   >60 Healthy dietary pattern, although there may be some specific behaviors that could be improved.    Nutrition Goals Re-Evaluation:   Nutrition Goals Discharge (Final Nutrition Goals Re-Evaluation):   Psychosocial: Target Goals: Acknowledge presence or absence of significant depression and/or stress, maximize coping skills, provide positive support system. Participant is able to verbalize types and ability to use techniques and skills needed for reducing stress and depression.   Education: Stress, Anxiety,  and Depression - Group verbal and visual presentation to define topics covered.  Reviews how body is impacted by stress, anxiety, and depression.  Also discusses healthy ways to reduce stress and to  treat/manage anxiety and depression.  Written material given at graduation.   Education: Sleep Hygiene -Provides group verbal and written instruction about how sleep can affect your health.  Define sleep hygiene, discuss sleep cycles and impact of sleep habits. Review good sleep hygiene tips.    Initial Review & Psychosocial Screening:  Initial Psych Review & Screening - 09/01/20 1542      Initial Review   Current issues with Current Psychotropic Meds   Bipolar  on meds controlling well.     Family Dynamics   Good Support System? Yes   Husband of 67 years, twin sons live in same neighborhood     Barriers   Psychosocial barriers to participate in program There are no identifiable barriers or psychosocial needs.;The patient should benefit from training in stress management and relaxation.;Psychosocial barriers identified (see note)      Screening Interventions   Interventions To provide support and resources with identified psychosocial needs;Provide feedback about the scores to participant    Expected Outcomes Short Term goal: Utilizing psychosocial counselor, staff and physician to assist with identification of specific Stressors or current issues interfering with healing process. Setting desired goal for each stressor or current issue identified.;Long Term Goal: Stressors or current issues are controlled or eliminated.;Short Term goal: Identification and review with participant of any Quality of Life or Depression concerns found by scoring the questionnaire.;Long Term goal: The participant improves quality of Life and PHQ9 Scores as seen by post scores and/or verbalization of changes           Quality of Life Scores:   Quality of Life - 10/11/20 1612      Quality of Life   Select Quality of Life      Quality of Life Scores   Health/Function Pre 18.93 %    Socioeconomic Pre 18.44 %    Psych/Spiritual Pre 30 %    Family Pre 24 %    GLOBAL Pre 21.76 %          Scores of 19  and below usually indicate a poorer quality of life in these areas.  A difference of  2-3 points is a clinically meaningful difference.  A difference of 2-3 points in the total score of the Quality of Life Index has been associated with significant improvement in overall quality of life, self-image, physical symptoms, and general health in studies assessing change in quality of life.  PHQ-9: Recent Review Flowsheet Data    Depression screen [facility] 2/9 11/23/2020 10/11/2020 05/26/2020 05/26/2020 04/24/2019   Decreased Interest 0 1 0 0 0   Down, Depressed, Hopeless 0 0 0 0 0   PHQ - 2 Score 0 1 0 0 0   Altered sleeping 0 1 0 - -   Tired, decreased energy 0 1 0 - -   Change in appetite 0 2 0 - -   Feeling bad or failure about yourself  0 0 0 - -   Trouble concentrating 0 0 0 - -   Moving slowly or fidgety/restless 0 0 0 - -   Suicidal thoughts 0 0 0 - -   PHQ-9 Score 0 5 0 - -   Difficult doing work/chores Not difficult at all Not difficult at all - - -     Interpretation of Total  Score  Total Score Depression Severity:  1-4 = Minimal depression, 5-9 = Mild depression, 10-14 = Moderate depression, 15-19 = Moderately severe depression, 20-27 = Severe depression   Psychosocial Evaluation and Intervention:  Psychosocial Evaluation - 09/01/20 1556      Psychosocial Evaluation & Interventions   Comments Capucine has no barriers to attending the program. She lives in her home with her husband of 36 years. She is ready to get started and see if she can improve her breathing and her stamina. She does have a bad right knee and will need to be careful not to aggravate it during her exercise. She does use a walker and a cane at times.  Her husband is her support and she has twin sons that are very helpful, that live in the same neighborhood. She is ready to start the program.    Expected Outcomes STG: Dierdre is able to attend all scheduled sessions LTG: Modupe is able to maintain all she has learned in the program     Continue Psychosocial Services  Follow up required by staff           Psychosocial Re-Evaluation:   Psychosocial Discharge (Final Psychosocial Re-Evaluation):   Vocational Rehabilitation: Provide vocational rehab assistance to qualifying candidates.   Vocational Rehab Evaluation & Intervention:  Vocational Rehab - 09/01/20 1546      Initial Vocational Rehab Evaluation & Intervention   Assessment shows need for Vocational Rehabilitation No           Education: Education Goals: Education classes will be provided on a variety of topics geared toward better understanding of heart health and risk factor modification. Participant will state understanding/return demonstration of topics presented as noted by education test scores.  Learning Barriers/Preferences:  Learning Barriers/Preferences - 09/01/20 1546      Learning Barriers/Preferences   Learning Barriers None    Learning Preferences None           General Cardiac Education Topics:  AED/CPR: - Group verbal and written instruction with the use of models to demonstrate the basic use of the AED with the basic ABC's of resuscitation.   Anatomy and Cardiac Procedures: - Group verbal and visual presentation and models provide information about basic cardiac anatomy and function. Reviews the testing methods done to diagnose heart disease and the outcomes of the test results. Describes the treatment choices: Medical Management, Angioplasty, or Coronary Bypass Surgery for treating various heart conditions including Myocardial Infarction, Angina, Valve Disease, and Cardiac Arrhythmias.  Written material given at graduation.   Medication Safety: - Group verbal and visual instruction to review commonly prescribed medications for heart and lung disease. Reviews the medication, class of the drug, and side effects. Includes the steps to properly store meds and maintain the prescription regimen.  Written material given at  graduation.   Intimacy: - Group verbal instruction through game format to discuss how heart and lung disease can affect sexual intimacy. Written material given at graduation..   Know Your Numbers and Heart Failure: - Group verbal and visual instruction to discuss disease risk factors for cardiac and pulmonary disease and treatment options.  Reviews associated critical values for Overweight/Obesity, Hypertension, Cholesterol, and Diabetes.  Discusses basics of heart failure: signs/symptoms and treatments.  Introduces Heart Failure Zone chart for action plan for heart failure.  Written material given at graduation.   Infection Prevention: - Provides verbal and written material to individual with discussion of infection control including proper hand washing and proper equipment cleaning during  exercise session. Flowsheet Row Cardiac Rehab from 10/11/2020 in [facility] Cardiac [facility] Pulmonary Rehab  Date 10/11/20  Educator AS  Instruction Review Code 1- Verbalizes Understanding      Falls Prevention: - Provides verbal and written material to individual with discussion of falls prevention and safety. Flowsheet Row Cardiac Rehab from 10/11/2020 in [facility] Cardiac and Pulmonary Rehab  Date 10/11/20  Educator AS  Instruction Review Code 1- Verbalizes Understanding      Other: -Provides group and verbal instruction on various topics (see comments)   Knowledge Questionnaire Score:  Knowledge Questionnaire Score - 10/11/20 1612      Knowledge Questionnaire Score   Pre Score 21/26 exercise/nutrition/heart           Core Components/Risk Factors/Patient Goals at Admission:  Personal Goals and Risk Factors at Admission - 10/11/20 1618      Core Components/Risk Factors/Patient Goals on Admission    Weight Management Yes    Intervention Weight Management: Develop a combined nutrition and exercise program designed to reach desired caloric intake, while maintaining appropriate intake of nutrient and  fiber, sodium and fats, and appropriate energy expenditure required for the weight goal.;Weight Management: Provide education and appropriate resources to help participant work on and attain dietary goals.;Weight Management/Obesity: Establish reasonable short term and long term weight goals.;Obesity: Provide education and appropriate resources to help participant work on and attain dietary goals.    Admit Weight 216 lb 6.4 oz (98.2 kg)    Goal Weight: Short Term 210 lb (95.3 kg)    Goal Weight: Long Term 175 lb (79.4 kg)    Expected Outcomes Short Term: Continue to assess and modify interventions until short term weight is achieved;Long Term: Adherence to nutrition and physical activity/exercise program aimed toward attainment of established weight goal;Weight Loss: Understanding of general recommendations for a balanced deficit meal plan, which promotes 1-2 lb weight loss per week and includes a negative energy balance of [phone] kcal/d    Diabetes Yes    Intervention Provide education about signs/symptoms and action to take for hypo/hyperglycemia.;Provide education about proper nutrition, including hydration, and aerobic/resistive exercise prescription along with prescribed medications to achieve blood glucose in normal ranges: Fasting glucose 65-99 mg/dL    Expected Outcomes Short Term: Participant verbalizes understanding of the signs/symptoms and immediate care of hyper/hypoglycemia, proper foot care and importance of medication, aerobic/resistive exercise and nutrition plan for blood glucose control.;Long Term: Attainment of HbA1C < 7%.    Intervention Provide education on lifestyle modifcations including regular physical activity/exercise, weight management, moderate sodium restriction and increased consumption of fresh fruit, vegetables, and low fat dairy, alcohol moderation, and smoking cessation.;Monitor prescription use compliance.    Expected Outcomes Short Term: Continued assessment and  intervention until BP is < 140/15m HG in hypertensive participants. < 130/822mHG in hypertensive participants with diabetes, heart failure or chronic kidney disease.;Long Term: Maintenance of blood pressure at goal levels.    Intervention Provide education and support for participant on nutrition & aerobic/resistive exercise along with prescribed medications to achieve LDL <7028mHDL >84m52m  Expected Outcomes Short Term: Participant states understanding of desired cholesterol values and is compliant with medications prescribed. Participant is following exercise prescription and nutrition guidelines.;Long Term: Cholesterol controlled with medications as prescribed, with individualized exercise RX and with personalized nutrition plan. Value goals: LDL < 70mg30mL > 40 mg.           Education:Diabetes - Individual verbal and written instruction to review signs/symptoms of diabetes, desired ranges  of glucose level fasting, after meals and with exercise. Acknowledge that pre and post exercise glucose checks will be done for 3 sessions at entry of program.   Core Components/Risk Factors/Patient Goals Review:    Core Components/Risk Factors/Patient Goals at Discharge (Final Review):    ITP Comments:  ITP Comments    Row Name 09/01/20 1554 10/11/20 1622 10/13/20 0651 10/18/20 1348 10/20/20 0908   ITP Comments Virtual orientation call completed today. shehas an appointment on Date: 09/23/2020 for EP eval and gym Orientation.  Documentation of diagnosis [facility] be found in [facility] Date: 08/03/2021. Completed 6MWT and gym orientation. Initial ITP created and sent for review to Dr. Emily Filbert, Medical Director. 30 Day review completed. Medical Director ITP review done, changes made as directed, and signed approval by Medical Director. First full day of exercise!  Patient was oriented to gym and equipment including functions, settings, policies, and procedures.  Patient's individual exercise prescription and  treatment plan were reviewed.  All starting workloads were established based on the results of the 6 minute walk test done at initial orientation visit.  The plan for exercise progression was also introduced and progression will be customized based on patient's performance and goals. Terrisa called to cancel.  She is very sore and in pain since Monday.  Her knees are really bothering her and she can barely walk.  She has been taking tramadol for them. She got set up for an appt with [facility] on Monday at 11am to see if they can help.  She wanted to stop altogether but we talked about why she was hurting;   DOMs, new to exercise and ways to help combat with ice.  We decided to hold off on the rest of the week to allow her to recover and ice.  She is going to call us after her appointment on Monday to see what they say at office.  We talked about slowing down some when she returns and taking more rest breaks.   [place] Name 10/20/20 1054 10/25/20 1505 10/25/20 1830 11/24/20 1520     ITP Comments Completed initial RD evaluation Went to doctor about knee.  Got cortisone injection in knee and they want her to do PT.  Will call back to clairfy home or outpatient. Patient called, she is currently receiving cortisone injections for her knee and is planning to start OP PT for it in a couple of weeks. Patient does not prefer to complete both rehab and PT at the same time. She is going to call [facility] tomorrow to find out how long she will need the PT for and depending on the time frame, we will either place her on a medical hold for Cardiac Rehab or discharge her at this time and get a new referral from her doctor when she is completed with her PT. Patient agreed with plan above. Spoke with patient- last time we spoke she wanted to wait until she completed PT to start rehab, however, she recently had heart issues and is currently undergoing testing. Patient would like to be discharged from Cardiac Rehab at this time. I  advised her to get a new referral from her cardiologist when she finishes PT and cleared to do so, and also if/when it is warranted as appropriate. Patients understood.           Comments: Discharge ITP  Only two sessions

## 2020-11-25 NOTE — Telephone Encounter (Signed)
Topamax Last filled 10/13/20, #90 Last OV:  4 mo f/u Next OV:  3 mo f/u

## 2020-11-27 ENCOUNTER — Encounter: Payer: Self-pay | Admitting: Family Medicine

## 2020-11-27 DIAGNOSIS — G43909 Migraine, unspecified, not intractable, without status migrainosus: Secondary | ICD-10-CM

## 2020-12-02 ENCOUNTER — Other Ambulatory Visit: Payer: Self-pay

## 2020-12-02 MED ORDER — ZIPRASIDONE HCL 40 MG PO CAPS
40.0000 mg | ORAL_CAPSULE | Freq: Two times a day (BID) | ORAL | 1 refills | Status: DC
Start: 2020-12-02 — End: 2020-12-06

## 2020-12-02 MED ORDER — TRAMADOL HCL 50 MG PO TABS: 50.0000 mg | ORAL_TABLET | Freq: Two times a day (BID) | ORAL | 0 refills | Status: AC | PRN

## 2020-12-02 NOTE — Telephone Encounter (Signed)
ERx 

## 2020-12-02 NOTE — Telephone Encounter (Signed)
Received faxed refill request from [facility].  Geodon Last rx 08/09/20 Last OV:  09/27/20, 4 mo f/u Next OV:  12/27/20, 3 mo f/u

## 2020-12-05 DIAGNOSIS — J449 Chronic obstructive pulmonary disease, unspecified: Secondary | ICD-10-CM | POA: Diagnosis not present

## 2020-12-06 ENCOUNTER — Encounter: Payer: Self-pay | Admitting: Cardiology

## 2020-12-06 ENCOUNTER — Encounter: Payer: Self-pay | Admitting: Family Medicine

## 2020-12-06 ENCOUNTER — Other Ambulatory Visit: Payer: Self-pay

## 2020-12-06 ENCOUNTER — Ambulatory Visit: Payer: Medicare HMO | Admitting: Cardiology

## 2020-12-06 ENCOUNTER — Telehealth: Payer: Self-pay | Admitting: Family Medicine

## 2020-12-06 VITALS — BP 110/58 | HR 87 | Ht 62.0 in | Wt 222.0 lb

## 2020-12-06 DIAGNOSIS — I2510 Atherosclerotic heart disease of native coronary artery without angina pectoris: Secondary | ICD-10-CM | POA: Diagnosis not present

## 2020-12-06 DIAGNOSIS — E7800 Pure hypercholesterolemia, unspecified: Secondary | ICD-10-CM

## 2020-12-06 DIAGNOSIS — R079 Chest pain, unspecified: Secondary | ICD-10-CM | POA: Diagnosis not present

## 2020-12-06 MED ORDER — TOPIRAMATE 100 MG PO TABS: ORAL_TABLET | ORAL | 1 refills | Status: DC

## 2020-12-06 MED ORDER — ZIPRASIDONE HCL 40 MG PO CAPS: 40.0000 mg | ORAL_CAPSULE | Freq: Two times a day (BID) | ORAL | 1 refills | Status: DC

## 2020-12-06 NOTE — Patient Instructions (Signed)

## 2020-12-06 NOTE — Telephone Encounter (Signed)
See Refill encounter, 12/02/20.

## 2020-12-06 NOTE — Telephone Encounter (Signed)
Patient call in requesting refill on medication   ziprasidone (GEODON) 40 MG capsule 90 capsule

## 2020-12-06 NOTE — Progress Notes (Signed)
Cardiology Office Note:    Date:  12/06/2020   ID:  [name], DOB Nov 09, 1950, MRN [id number]  PCP:  Ria Bush, MD  [facility] Cardiologist:  Kate Sable, MD  [place] Electrophysiologist:  None   Referring MD: Ria Bush, MD   Chief Complaint  Patient presents with  . Other    6 week follow up. Patient c.o chest pain that is about a 4 on the pain scale. Meds reviewed verbally with patient.     History of Present Illness:    [name] is a 70 y.o. female with a hx of CAD/CABG x 1 (LIMA-LAD, 07/2020), hypertension, hyperlipidemia, diabetes, former smoker x40 years, COPD, OSA who presents for follow-up.  Previously seen due to chest pain.  Imdur was increased to 30 mg daily.  Myoview ordered to evaluate presence of ischemia.  She still has chest discomfort not associated with exertion.  Denies any trauma.  She did try cardiac rehab but sustained right knee soreness.  Currently working with physical therapy.  Prior notes Intraoperative TEE, EF approximately 50%. Right and left heart cath 06/2020 single-vessel CAD sequential 70% ostial, 90% mid LAD. CAD/CABG x1 LIMA to LAD 07/2020   Past Medical History:  Diagnosis Date  . Anxiety   . Arthritis    per patient "all over body"  . Bipolar disorder ([place])    has stopped seeing psychiatrist  . CAD (coronary artery disease)    a. 06/2020 MV: small, sev, apical inf/apical defect-scar vs ischemia. EF 45%; b. 06/2020 Cath: LM 24m, LAD 70ost, 56m, LCX nl, RCA nl; c. 07/2020 CABG x 1: LIMA->LAD.  Marland Kitchen Cataract   . Chronic bronchitis   . COPD (chronic obstructive pulmonary disease) ([place])   . Coronary artery disease, non-occlusive   . Depression   . Dyspnea 2012   s/p pulm/cards w/u WNL, thought due to obesity/deconditioning  . Fibromyalgia   . GERD (gastroesophageal reflux disease)   . HFmrEF (heart failure with mid-range ejection fraction) ([place])    a. 05/2020 Echo: EF 40%, glob HK, mild LVH, Gr1  DD; b. 06/2020 EF by SPECT: 45%; c. 07/2020 intraop TEE: EF 50%.  . History of chicken pox   . HLD (hyperlipidemia)   . Hypertension   . Hypothyroidism   . Ischemic cardiomyopathy    a. 05/2020 Echo: EF 40%; b. 06/2020 EF by SPECT: 45%; c. 07/2020 intraop TEE: EF 50%.  . Migraine   . OSA (obstructive sleep apnea) 03/16/2011  . Pneumonia   . Sleep apnea    per patient cannot tolerate CPAP  . T2DM (type 2 diabetes mellitus) ([place]) 2012   DM education 06/2011  . Urinary incontinence     Past Surgical History:  Procedure Laterality Date  . BREAST CYST ASPIRATION Right 2003  . cardiac catherization  03/2011   x3 [facility]), mild nonobstructive CAD  . COLONOSCOPY  12/2011   hyperplastic polyp x2,. diverticulosis, rec rpt 10 yrs  . CORONARY ARTERY BYPASS GRAFT N/A 08/03/2020   Procedure: CORONARY ARTERY BYPASS GRAFTING TIMES ONE USING LEFT INTERNAL MAMMARY ARTERY.;  Surgeon: Gaye Pollack, MD;  Location: [place] OR;  Service: Open Heart Surgery;  Laterality: N/A;  . ESOPHAGOGASTRODUODENOSCOPY  2006  . ESOPHAGOGASTRODUODENOSCOPY  05/2020   no esophageal abnormality to explain dysphagia - esophagus dilated. Erosive gastropathy - neg H pylori [facility])  . KNEE SURGERY  2006   left, torn meniscus  . RIGHT/LEFT HEART CATH AND CORONARY ANGIOGRAPHY N/A 07/13/2020   Procedure: RIGHT/LEFT HEART  CATH AND CORONARY ANGIOGRAPHY;  Surgeon: Nelva Bush, MD;  Location: [place] [facility];  Service: Cardiovascular;  Laterality: N/A;  . TEE WITHOUT CARDIOVERSION N/A 08/03/2020   Procedure: TRANSESOPHAGEAL ECHOCARDIOGRAM (TEE);  Surgeon: Gaye Pollack, MD;  Location: [place];  Service: Open Heart Surgery;  Laterality: N/A;  . TOTAL ABDOMINAL HYSTERECTOMY  2004   fibroids, cervix remained  . TOTAL KNEE ARTHROPLASTY Left 08/2012   [name], [facility]    Current Medications: Current Meds  Medication Sig  . albuterol (VENTOLIN HFA) 108 (90 Base) MCG/ACT inhaler Inhale 2 puffs into the lungs every 6  (six) hours as needed for wheezing or shortness of breath.  . Apple Cider Vinegar 600 MG CAPS   . aspirin 81 MG EC tablet Take 1 tablet (81 mg total) by mouth daily.  . [place]well[place]-Glucosamine-Vit D (OSTEO BI-FLEX ONE PER DAY PO) Take 1 tablet by mouth daily.  . famotidine (PEPCID) 20 MG tablet Take 20 mg by mouth every other day. Alternates with omeprazole  . INS SYRINGE/NEEDLE 1CC/28G (B-D INS SYR MICROFINE 1CC/28G) 28G X 1/2" 1 ML MISC USE AS DIRECTED  . insulin NPH Human (NOVOLIN N) 100 UNIT/ML injection Inject 0.2 mLs (20 Units total) into the skin daily after supper.  . isosorbide mononitrate (IMDUR) 30 MG 24 hr tablet Take 0.5 tablets (15 mg total) by mouth daily.  Marland Kitchen levothyroxine (SYNTHROID) 100 MCG tablet TAKE 1 TABLET DAILY, AND ON2 DAYS A WEEK, TAKE AN     EXTRA 1/2 TABLET  . loratadine (CLARITIN) 10 MG tablet Take 1 tablet (10 mg total) by mouth daily.  . metFORMIN (GLUCOPHAGE) 500 MG tablet Take 1 tablet (500 mg total) by mouth 2 (two) times daily with a meal.  . Multiple Vitamin (MULTIVITAMIN WITH MINERALS) TABS tablet Take 1 tablet by mouth daily. One A Day for Women  . nystatin cream (MYCOSTATIN) APPLY TO AFFECTED AREA TWICE A DAY  . omeprazole (PRILOSEC) 20 MG capsule TAKE 1 DAILY-ALTERNATES WITH FAMOTIDINE  . ondansetron (ZOFRAN ODT) 4 MG disintegrating tablet Take 1 tablet by mouth every 4- 6 hours as needed  . ONE TOUCH LANCETS MISC Use to check sugar twice daily Dx: E11.8  . ONETOUCH ULTRA test strip USE TO CHECK BLOOD SUGAR 2 TIMES   DAILY  . OXYGEN Inhale 2 L into the lungs at bedtime.  . pioglitazone (ACTOS) 15 MG tablet Take 1 tablet (15 mg total) by mouth daily.  . Rhubarb (ESTROVEN COMPLETE PO) Take 1 tablet by mouth daily.  . simvastatin (ZOCOR) 40 MG tablet TAKE 1 TABLET AT BEDTIME  . topiramate (TOPAMAX) 100 MG tablet TAKE 1 TABLET BY MOUTH EVERYDAY AT BEDTIME  . traMADol (ULTRAM) 50 MG tablet Take 1 tablet (50 mg total) by mouth 2 (two) times daily as needed for up  to 5 days.  Marland Kitchen umeclidinium-vilanterol (ANORO ELLIPTA) 62.5-25 MCG/INH AEPB Inhale 1 puff into the lungs daily.  . ziprasidone (GEODON) 40 MG capsule Take 1 capsule (40 mg total) by mouth in the morning and at bedtime.     Allergies:   Crestor [rosuvastatin], Lipitor [atorvastatin], Lithium, and Quetiapine   Social History   Socioeconomic History  . Marital status: Married    Spouse name: Not on file  . Number of children: Not on file  . Years of education: Not on file  . Highest education level: Not on file  Occupational History  . Occupation: disabled    Employer: OTHER  Tobacco Use  . Smoking status: Former Smoker  Packs/day: 2.00    Years: 43.00    Pack years: 86.00    Types: Cigarettes    Quit date: 05/17/2010    Years since quitting: 10.5  . Smokeless tobacco: Never Used  . Tobacco comment: QUIT 2011  Vaping Use  . Vaping Use: Never used  Substance and Sexual Activity  . Alcohol use: No    Alcohol/week: 0.0 standard drinks  . Drug use: No  . Sexual activity: Yes    Partners: Male  Other Topics Concern  . Not on file  Social History Narrative   Caffeine: 2-3 coffee/am, 1 cup tea in afternoon; Lives with husband, 1 dog, 2 cats; Occupation: disability since 2009 for bipolar, previously worked at [employer]; Diet: not many fruits, good vegetables, good amt water.      Smoker 43 years; quit in 2011. No alcohol. Lives in [place]; with husband.    Social Determinants of Health   Financial Resource Strain: Low Risk   . Difficulty of Paying Living Expenses: Not hard at all  Food Insecurity: No Food Insecurity  . Worried About Charity fundraiser in the Last Year: Never true  . Ran Out of Food in the Last Year: Never true  Transportation Needs: No Transportation Needs  . Lack of Transportation (Medical): No  . Lack of Transportation (Non-Medical): No  Physical Activity: Inactive  . Days of Exercise per Week: 0 days  . Minutes of Exercise per Session: 0 min  Stress:  No Stress Concern Present  . Feeling of Stress : Not at all  Social Connections: Not on file     Family History: The patient's family history includes Acute lymphoblastic leukemia (age of onset: 51) in her cousin; Aneurysm in her maternal uncle; Arthritis in her mother; Asthma in her maternal uncle; Breast cancer in her maternal aunt; COPD in her sister; Cancer in her maternal uncle and paternal uncle; Cancer (age of onset: 52) in her father; Cancer (age of onset: 71) in her maternal aunt; Colon cancer (age of onset: 40) in her maternal uncle; Diabetes in her father; Hypertension in her mother; Stroke in her mother; Thyroid disease in her father. There is no history of Coronary artery disease, Stomach cancer, Rectal cancer, or Esophageal cancer.  ROS:   Please see the history of present illness.     All other systems reviewed and are negative.  EKGs/Labs/Other Studies Reviewed:    The following studies were reviewed today:   EKG:  EKG is  ordered today.  The ekg ordered today demonstrates sinus rhythm  Recent Labs: 08/04/2020: Magnesium 1.8 09/14/2020: ALT 9; TSH 1.37 10/27/2020: BUN 17; Creatinine, Ser 0.66; Hemoglobin 10.8; Platelets 400; Potassium 3.9; Sodium 139  Recent Lipid Panel    Component Value Date/Time   CHOL 131 05/21/2020 0915   TRIG 239.0 (H) 05/21/2020 0915   HDL 42.80 05/21/2020 0915   CHOLHDL 3 05/21/2020 0915   VLDL 47.8 (H) 05/21/2020 0915   LDLCALC 48 12/08/2013 0824   LDLDIRECT 65.0 05/21/2020 0915     Risk Assessment/Calculations:      Physical Exam:    VS:  BP (!) 110/58 (BP Location: Right Arm, Patient Position: Sitting, Cuff Size: Normal)   Pulse 87   Ht 5\' 2"  (1.575 m)   Wt 222 lb (100.7 kg)   SpO2 95%   BMI 40.60 kg/m     Wt Readings from Last 3 Encounters:  12/06/20 222 lb (100.7 kg)  11/04/20 215 lb 4 oz (97.6 kg)  10/29/20  217 lb (98.4 kg)     GEN:  Well nourished, well developed in no acute distress HEENT: Normal NECK: No JVD; No  carotid bruits CARDIAC: RRR, no murmurs, rubs, gallops RESPIRATORY:  Clear to auscultation, decreased breath sounds at bases ABDOMEN: Soft, non-tender, distended MUSCULOSKELETAL:  No edema; left-sided chest tenderness noted. SKIN: Warm and dry NEUROLOGIC:  Alert and oriented x 3 PSYCHIATRIC:  Normal affect   ASSESSMENT:    1. Coronary artery disease involving native coronary artery of native heart without angina pectoris   2. Pure hypercholesterolemia   3. Obesity, morbid, BMI 40.0-49.9 (HCC)   4. Chest pain, unspecified type    PLAN:    In order of problems listed above:  1. CAD/CABG x1 on 07/2020.  Last EF on intraprocedural TEE 07/2020, EF 50%.  Denies chest pain.  Lexiscan Myoview with fixed apical and apical inferior defect consistent with prior scar.  Continue Imdur 30 mg daily for antianginal benefit, aspirin, Crestor. 2. hyperlipidemia.  LDL at goal, continue Crestor 3. morbidly obese, weight loss, low-calorie diet recommended. 4. Left chest tenderness, reproducible with palpation.  Chest discomfort not of cardiac origin, musculoskeletal in etiology.  Trial of NSAIDs, follow-up with PCP recommended.  Follow-up in 6 months.   Medication Adjustments/Labs and Tests Ordered: Current medicines are reviewed at length with the patient today.  Concerns regarding medicines are outlined above.  Orders Placed This Encounter  Procedures  . EKG 12-Lead   No orders of the defined types were placed in this encounter.   Patient Instructions  Medication Instructions:  Your physician recommends that you continue on your current medications as directed. Please refer to the Current Medication list given to you today.  *If you need a refill on your cardiac medications before your next appointment, please call your pharmacy*   Lab Work: None ordered If you have labs (blood work) drawn today and your tests are completely normal, you will receive your results only by: Marland Kitchen MyChart Message  (if you have MyChart) OR . A paper copy in the mail If you have any lab test that is abnormal or we need to change your treatment, we will call you to review the results.   Testing/Procedures: None ordered   Follow-Up: At [facility], you and your health needs are our priority.  As part of our continuing mission to provide you with exceptional heart care, we have created designated Provider Care Teams.  These Care Teams include your primary Cardiologist (physician) and Advanced Practice Providers (APPs -  Physician Assistants and Nurse Practitioners) who all work together to provide you with the care you need, when you need it.  We recommend signing up for the patient portal called "MyChart".  Sign up information is provided on this After Visit Summary.  MyChart is used to connect with patients for Virtual Visits (Telemedicine).  Patients are able to view lab/test results, encounter notes, upcoming appointments, etc.  Non-urgent messages can be sent to your provider as well.   To learn more about what you can do with MyChart, go to [email or web address].    Your next appointment:   6 month(s)  The format for your next appointment:   In Person  Provider:   Kate Sable, MD   Other Instructions      Signed, Kate Sable, MD  12/06/2020 4:41 PM    [place]

## 2020-12-06 NOTE — Addendum Note (Signed)
Addended by: Ria Bush on: 12/06/2020 05:24 PM   Modules accepted: Orders

## 2020-12-06 NOTE — Telephone Encounter (Signed)
Refilled again.

## 2020-12-06 NOTE — Addendum Note (Signed)
Addended by: Ria Bush on: 12/06/2020 05:28 PM   Modules accepted: Orders

## 2020-12-07 DIAGNOSIS — M1711 Unilateral primary osteoarthritis, right knee: Secondary | ICD-10-CM | POA: Diagnosis not present

## 2020-12-07 NOTE — Telephone Encounter (Signed)
Noted  

## 2020-12-10 ENCOUNTER — Ambulatory Visit
Admission: RE | Admit: 2020-12-10 | Discharge: 2020-12-10 | Disposition: A | Discharge status: Home / Self Care | Payer: Medicare HMO | Source: Ambulatory Visit | Attending: Family Medicine | Admitting: Family Medicine

## 2020-12-10 ENCOUNTER — Other Ambulatory Visit: Payer: Self-pay

## 2020-12-10 DIAGNOSIS — Z1231 Encounter for screening mammogram for malignant neoplasm of breast: Secondary | ICD-10-CM | POA: Diagnosis not present

## 2020-12-11 ENCOUNTER — Encounter: Payer: Self-pay | Admitting: Family Medicine

## 2020-12-14 MED ORDER — ZIPRASIDONE HCL 40 MG PO CAPS: 40.0000 mg | ORAL_CAPSULE | Freq: Two times a day (BID) | ORAL | 1 refills | Status: DC

## 2020-12-14 NOTE — Telephone Encounter (Signed)
We sent in #90 instead of #180 on last refill of geodon (ziprasidone).  Can we call mail order pharmacy to see if any way to send full #90d supply to patient? She was charged full 90d supply but only received 45d supply as she takes BID.  It was mistake on our part but also pharmacy mistake as they charged full 90d cost for 45d supply.

## 2020-12-15 NOTE — Telephone Encounter (Signed)
Spoke with Rodman Pickle of [place] asking about pt's cost of med for 90 days and 180 days is about $141, per pt's plan.

## 2020-12-17 DIAGNOSIS — M179 Osteoarthritis of knee, unspecified: Secondary | ICD-10-CM | POA: Diagnosis not present

## 2020-12-20 DIAGNOSIS — M1711 Unilateral primary osteoarthritis, right knee: Secondary | ICD-10-CM | POA: Diagnosis not present

## 2020-12-21 ENCOUNTER — Other Ambulatory Visit: Payer: Self-pay | Admitting: Family Medicine

## 2020-12-27 ENCOUNTER — Ambulatory Visit (INDEPENDENT_AMBULATORY_CARE_PROVIDER_SITE_OTHER): Payer: Medicare HMO | Admitting: Family Medicine

## 2020-12-27 ENCOUNTER — Other Ambulatory Visit: Payer: Self-pay

## 2020-12-27 ENCOUNTER — Telehealth: Payer: Self-pay

## 2020-12-27 ENCOUNTER — Encounter: Payer: Self-pay | Admitting: Family Medicine

## 2020-12-27 VITALS — BP 138/70 | HR 84 | Temp 97.4°F | Ht 62.0 in | Wt 221.4 lb

## 2020-12-27 DIAGNOSIS — I700 Atherosclerosis of aorta: Secondary | ICD-10-CM | POA: Diagnosis not present

## 2020-12-27 DIAGNOSIS — Z794 Long term (current) use of insulin: Secondary | ICD-10-CM | POA: Diagnosis not present

## 2020-12-27 DIAGNOSIS — I77811 Abdominal aortic ectasia: Secondary | ICD-10-CM

## 2020-12-27 DIAGNOSIS — I2510 Atherosclerotic heart disease of native coronary artery without angina pectoris: Secondary | ICD-10-CM

## 2020-12-27 DIAGNOSIS — R634 Abnormal weight loss: Secondary | ICD-10-CM

## 2020-12-27 DIAGNOSIS — E118 Type 2 diabetes mellitus with unspecified complications: Secondary | ICD-10-CM

## 2020-12-27 LAB — MICROALBUMIN / CREATININE URINE RATIO
Creatinine,U: 55.5 mg/dL
Microalb Creat Ratio: 4.3 mg/g (ref 0.0–30.0)
Microalb, Ur: 2.4 mg/dL — ABNORMAL HIGH (ref 0.0–1.9)

## 2020-12-27 LAB — POCT GLYCOSYLATED HEMOGLOBIN (HGB A1C): Hemoglobin A1C: 5.6 % (ref 4.0–5.6)

## 2020-12-27 LAB — POCT GLUCOSE (DEVICE FOR HOME USE): POC Glucose: 114 mg/dl — AB (ref 70–99)

## 2020-12-27 MED ORDER — INSULIN NPH (HUMAN) (ISOPHANE) 100 UNIT/ML ~~LOC~~ SUSP: 10.0000 [IU] | Freq: Every day | SUBCUTANEOUS | 3 refills | Status: DC

## 2020-12-27 NOTE — Progress Notes (Signed)
Patient ID: [name], female    DOB: 12-02-1950, 70 y.o.   MRN: [id number]  This visit was conducted in person.  BP 138/70   Pulse 84   Temp (!) 97.4 F (36.3 C) (Temporal)   Ht 5\' 2"  (1.575 m)   Wt 221 lb 7 oz (100.4 kg)   SpO2 96%   BMI 40.50 kg/m    CC: 3 mo DM f/u visit  Subjective:   HPI: [name] is a 70 y.o. female presenting on 12/27/2020 for Follow-up (Here for 3 mo f/u.)   To get 2nd booster on Friday.  Upcoming DEXA 04/2021.  She continues PT with benefit - found to have significant R hip tightness - working on this.   Recently saw cards Dr Garen Lah - known CAD s/p 1v CABG 07/2020. Continues imdur 30mg , aspirin, crestor. Thought MSK pain latest cause of chest pain. rec manage this with ibuprofen/aleve.   Saw GI 10/2020, note reviewed. Recent weight loss thought due to ongoing nausea, reassuring evaluation with EGD, labs, imaging. Changing to low carb diet contributed to weight loss (nausea to carbs). Weight since seems to have stabilized. Continues zofran PRN nausea. Started on fiber supplement for bowel movement regularity.   DM - does regularly check sugars and brings meticulous log - congratulated on good track - fasting 100-120, post-dinner 70-190s. Notes some low sugar readings down to 70 after dinner. Compliant with antihyperglycemic regimen which includes: metformin 500mg  bid (higher dose limited by GI), novolin NPH 20u daily after supper, actos 15mg  daily. Denies low sugars or hypoglycemic symptoms. Denies paresthesias. Last diabetic eye exam 06/2020. Glucometer brand: onetouch - running out of strips as she lost her supply for this month, and unable to fill until next month. DSME: 06/2011.  Lab Results  Component Value Date   HGBA1C 5.6 12/27/2020   Diabetic Foot Exam - Simple   No data filed    Lab Results  Component Value Date   MICROALBUR 5.8 (H) 01/15/2015        Relevant past medical, surgical, family and social history reviewed and  updated as indicated. Interim medical history since our last visit reviewed. Allergies and medications reviewed and updated. Outpatient Medications Prior to Visit  Medication Sig Dispense Refill  . albuterol (VENTOLIN HFA) 108 (90 Base) MCG/ACT inhaler Inhale 2 puffs into the lungs every 6 (six) hours as needed for wheezing or shortness of breath. 8 g 3  . Apple Cider Vinegar 600 MG CAPS     . aspirin 81 MG EC tablet Take 1 tablet (81 mg total) by mouth daily.    . Boswellia-Glucosamine-Vit D (OSTEO BI-FLEX ONE PER DAY PO) Take 1 tablet by mouth daily.    . famotidine (PEPCID) 20 MG tablet Take 20 mg by mouth every other day. Alternates with omeprazole    . INS SYRINGE/NEEDLE 1CC/28G (B-D INS SYR MICROFINE 1CC/28G) 28G X 1/2" 1 ML MISC USE AS DIRECTED 100 each 3  . levothyroxine (SYNTHROID) 100 MCG tablet TAKE 1 TABLET DAILY, AND ON2 DAYS A WEEK, TAKE AN     EXTRA 1/2 TABLET 90 tablet 3  . loratadine (CLARITIN) 10 MG tablet Take 1 tablet (10 mg total) by mouth daily.    . metFORMIN (GLUCOPHAGE) 500 MG tablet Take 1 tablet (500 mg total) by mouth 2 (two) times daily with a meal. 180 tablet 1  . Multiple Vitamin (MULTIVITAMIN WITH MINERALS) TABS tablet Take 1 tablet by mouth daily. One A Day for Women    .  nystatin cream (MYCOSTATIN) APPLY TO AFFECTED AREA TWICE A DAY 30 g 1  . omeprazole (PRILOSEC) 20 MG capsule TAKE 1 DAILY-ALTERNATES WITH FAMOTIDINE 90 capsule 1  . ondansetron (ZOFRAN ODT) 4 MG disintegrating tablet Take 1 tablet by mouth every 4- 6 hours as needed 30 tablet 3  . ONE TOUCH LANCETS MISC Use to check sugar twice daily Dx: E11.8 200 each 3  . ONETOUCH ULTRA test strip USE TO CHECK BLOOD SUGAR 2 TIMES   DAILY 200 strip 3  . OXYGEN Inhale 2 L into the lungs at bedtime.    . pioglitazone (ACTOS) 15 MG tablet Take 1 tablet (15 mg total) by mouth daily. 90 tablet 1  . Rhubarb (ESTROVEN COMPLETE PO) Take 1 tablet by mouth daily.    . simvastatin (ZOCOR) 40 MG tablet TAKE 1 TABLET AT  BEDTIME 90 tablet 3  . topiramate (TOPAMAX) 100 MG tablet Take 0.5 tablets (50 mg total) by mouth daily AND 1 tablet (100 mg total) at bedtime. 135 tablet 1  . umeclidinium-vilanterol (ANORO ELLIPTA) 62.5-25 MCG/INH AEPB Inhale 1 puff into the lungs daily. 60 each 5  . ziprasidone (GEODON) 40 MG capsule Take 1 capsule (40 mg total) by mouth in the morning and at bedtime. 180 capsule 1  . insulin NPH Human (NOVOLIN N) 100 UNIT/ML injection Inject 0.2 mLs (20 Units total) into the skin daily after supper.    . isosorbide mononitrate (IMDUR) 30 MG 24 hr tablet Take 0.5 tablets (15 mg total) by mouth daily. 15 tablet 1   No facility-administered medications prior to visit.     Per HPI unless specifically indicated in ROS section below Review of Systems Objective:  BP 138/70   Pulse 84   Temp (!) 97.4 F (36.3 C) (Temporal)   Ht 5\' 2"  (1.575 m)   Wt 221 lb 7 oz (100.4 kg)   SpO2 96%   BMI 40.50 kg/m   Wt Readings from Last 3 Encounters:  12/27/20 221 lb 7 oz (100.4 kg)  12/06/20 222 lb (100.7 kg)  11/04/20 215 lb 4 oz (97.6 kg)      Physical Exam Vitals and nursing note reviewed.  Constitutional:      General: She is not in acute distress.    Appearance: Normal appearance. She is well-developed. She is obese. She is not ill-appearing.  HENT:     Head: Normocephalic and atraumatic.  Eyes:     General: No scleral icterus.    Extraocular Movements: Extraocular movements intact.     Conjunctiva/sclera: Conjunctivae normal.     Pupils: Pupils are equal, round, and reactive to light.  Cardiovascular:     Rate and Rhythm: Normal rate and regular rhythm.     Pulses: Normal pulses.     Heart sounds: Normal heart sounds. No murmur heard.   Pulmonary:     Effort: Pulmonary effort is normal. No respiratory distress.     Breath sounds: Normal breath sounds. No wheezing, rhonchi or rales.  Musculoskeletal:     Cervical back: Normal range of motion and neck supple.     Right lower leg:  No edema.     Left lower leg: No edema.     Comments: See HPI for foot exam if done  Lymphadenopathy:     Cervical: No cervical adenopathy.  Skin:    General: Skin is warm and dry.     Findings: No rash.  Neurological:     Mental Status: She is alert.  Psychiatric:  Mood and Affect: Mood normal.        Behavior: Behavior normal.       Results for orders placed or performed in visit on 12/27/20  POCT glycosylated hemoglobin (Hb A1C)  Result Value Ref Range   Hemoglobin A1C 5.6 4.0 - 5.6 %   HbA1c POC (<> result, manual entry)     HbA1c, POC (prediabetic range)     HbA1c, POC (controlled diabetic range)    POCT Glucose (Device for Home Use)  Result Value Ref Range   Glucose Fasting, POC     POC Glucose 114 (A) 70 - 99 mg/dl   Assessment & Plan:  This visit occurred during the SARS-CoV-2 public health emergency.  Safety protocols were in place, including screening questions prior to the visit, additional usage of staff PPE, and extensive cleaning of exam room while observing appropriate contact time as indicated for disinfecting solutions.   Problem List Items Addressed This Visit    Diabetes mellitus type 2, controlled, with complications ([place]) - Primary    A1c remains low - and endorses ongoing hypoglycemic symptoms. Will stop Novolin N (was taking 20u with supper) and reassess. Continue metformin and actos. Provided with 15-15 rule for hypoglycemia.  RTC 3 mo DM f/u visit.       Relevant Medications   insulin NPH Human (NOVOLIN N) 100 UNIT/ML injection   Other Relevant Orders   POCT glycosylated hemoglobin (Hb A1C) (Completed)   POCT Glucose (Device for Home Use) (Completed)   Microalbumin / creatinine urine ratio   CAD (coronary artery disease)    S/p 1v CABG 07/2020 ([place]) Appreciate cards care.       Weight loss, unintentional    This has stabilized. S/p reassuring GI evaluation. Thought related to low carb diet as carbs caused nausea post CABG.        Aortic atherosclerosis (HCC)    Continue aspirin, statin.       Abdominal aortic ectasia (HCC)       Meds ordered this encounter  Medications  . insulin NPH Human (NOVOLIN N) 100 UNIT/ML injection    Sig: Inject 0.1 mLs (10 Units total) into the skin daily after supper.    Dispense:  10 mL    Refill:  3    Note new dose   Orders Placed This Encounter  Procedures  . Microalbumin / creatinine urine ratio  . POCT glycosylated hemoglobin (Hb A1C)  . POCT Glucose (Device for Home Use)    Patient Instructions  Urine test today  Congratulations on great sugar control!  Hold insulin for now - you may no longer need it.  Space out sugar checks to once daily until you get new strips.  May use heating pad to chest wall pain to see if improvement.  Return in 3 months for diabetes follow up visit.   The 15-15 rule for low sugars: If sugar reading below 70, have 15 grams of carbohydrate to raise your blood sugar and check it after 15 minutes. If it's still below 70 mg/dL, have another serving. 15 grams of carbs may be: -Glucose tablets (see instructions) -Gel tube (see instructions) -4 ounces (1/2 cup) of juice or regular soda (not diet) -1 tablespoon of sugar, honey, or corn syrup -Hard candies, jellybeans or gumdrops--see food label for how many to consume  Repeat these steps until your blood sugar is at least 70 mg/dL. Once your blood sugar is back to normal, eat a meal or snack to make sure  it doesn't lower again.    Follow up plan: Return in about 3 months (around 03/29/2021) for follow up visit.  Ria Bush, MD

## 2020-12-27 NOTE — Assessment & Plan Note (Addendum)
A1c remains low - and endorses ongoing hypoglycemic symptoms. Will stop Novolin N (was taking 20u with supper) and reassess. Continue metformin and actos. Provided with 15-15 rule for hypoglycemia.  RTC 3 mo DM f/u visit.

## 2020-12-27 NOTE — Patient Instructions (Addendum)
Urine test today  Congratulations on great sugar control!  Hold insulin for now - you may no longer need it.  Space out sugar checks to once daily until you get new strips.  May use heating pad to chest wall pain to see if improvement.  Return in 3 months for diabetes follow up visit.   The 15-15 rule for low sugars: If sugar reading below 70, have 15 grams of carbohydrate to raise your blood sugar and check it after 15 minutes. If it's still below 70 mg/dL, have another serving. 15 grams of carbs may be: -Glucose tablets (see instructions) -Gel tube (see instructions) -4 ounces (1/2 cup) of juice or regular soda (not diet) -1 tablespoon of sugar, honey, or corn syrup -Hard candies, jellybeans or gumdrops--see food label for how many to consume  Repeat these steps until your blood sugar is at least 70 mg/dL. Once your blood sugar is back to normal, eat a meal or snack to make sure it doesn't lower again.

## 2020-12-27 NOTE — Assessment & Plan Note (Signed)
Continue aspirin, statin.  

## 2020-12-27 NOTE — Telephone Encounter (Signed)
I called and spoke with pharmacy. They are trying to manually stop fill of Novolin N and will update patient's account with results. I spoke with Reneka - advised I would cover cost of insulin if it is still filled.

## 2020-12-27 NOTE — Assessment & Plan Note (Signed)
S/p 1v CABG 07/2020 [facility]) Appreciate cards care.

## 2020-12-27 NOTE — Telephone Encounter (Signed)
Patient called stating that Dr Darnell Level told her that she needs to stop insulin but then her AVS said that RX was sent in to [facility]. I called the pharmacy to ask them to cancel this RX and representative spoke with their supervisor and the pharmacy tech and said because RX was already processed and label is in the process of been sent out they can not cancel it. I expressed my concern with them that patient is going to be charged for something that was not meant to be sent and will be wasted. Their response repetitively was that there was nothing they could do to help. And that patient can call customer service and ask them to refund her on this medication, not sure if it would be 100% reembursed. I did not call patient back yet to let her know this. I am not sure what to do to resolve this. Thank you.

## 2020-12-27 NOTE — Assessment & Plan Note (Signed)
This has stabilized. S/p reassuring GI evaluation. Thought related to low carb diet as carbs caused nausea post CABG.

## 2020-12-28 NOTE — Telephone Encounter (Signed)
Noted  

## 2020-12-29 DIAGNOSIS — M1711 Unilateral primary osteoarthritis, right knee: Secondary | ICD-10-CM | POA: Diagnosis not present

## 2020-12-31 ENCOUNTER — Ambulatory Visit: Payer: Medicare HMO | Attending: Internal Medicine

## 2020-12-31 DIAGNOSIS — Z23 Encounter for immunization: Secondary | ICD-10-CM

## 2020-12-31 NOTE — Progress Notes (Signed)
   Covid-19 Vaccination Clinic  Name:  [name]    MRN: [id number] DOB: [birth year]  12/31/2020  [name] was observed post Covid-19 immunization for 15 minutes without incident. She was provided with Vaccine Information Sheet and instruction to access the V-Safe system.   [name] was instructed to call 911 with any severe reactions post vaccine: Marland Kitchen Difficulty breathing  . Swelling of face and throat  . A fast heartbeat  . A bad rash all over body  . Dizziness and weakness   Immunizations Administered    Name Date Dose VIS Date Route   Moderna Covid-19 Booster Vaccine 12/31/2020  9:51 AM 0.25 mL 06/09/2020 Intramuscular   Manufacturer: Levan Hurst   Lot: [id number]   [place]: [place], PharmD, MBA Clinical Acute Care Pharmacist

## 2021-01-03 ENCOUNTER — Other Ambulatory Visit: Payer: Self-pay

## 2021-01-03 ENCOUNTER — Encounter: Payer: Self-pay | Admitting: Family Medicine

## 2021-01-03 MED ORDER — MODERNA COVID-19 VACCINE 100 MCG/0.5ML IM SUSP
INTRAMUSCULAR | 0 refills | Status: DC
  Filled 2021-01-03: qty 0.25, 1d supply, fill #0

## 2021-01-03 MED ORDER — ISOSORBIDE MONONITRATE ER 30 MG PO TB24: 15.0000 mg | ORAL_TABLET | Freq: Every day | ORAL | 4 refills | Status: DC

## 2021-01-03 NOTE — Telephone Encounter (Signed)
*  STAT* If patient is at the pharmacy, call can be transferred to refill team.   1. Which medications need to be refilled? (please list name of each medication and dose if known) Isosorbide  2. Which pharmacy/location (including street and city if local pharmacy) is medication to be sent to? [facility]  3. Do they need a 30 day or 90 day supply? [place]

## 2021-01-04 DIAGNOSIS — J449 Chronic obstructive pulmonary disease, unspecified: Secondary | ICD-10-CM | POA: Diagnosis not present

## 2021-01-05 ENCOUNTER — Encounter: Payer: Self-pay | Admitting: Family Medicine

## 2021-01-05 DIAGNOSIS — M1711 Unilateral primary osteoarthritis, right knee: Secondary | ICD-10-CM | POA: Diagnosis not present

## 2021-01-10 ENCOUNTER — Ambulatory Visit: Payer: Medicare HMO | Admitting: Cardiology

## 2021-01-10 ENCOUNTER — Encounter: Payer: Self-pay | Admitting: Family Medicine

## 2021-01-10 ENCOUNTER — Other Ambulatory Visit: Payer: Self-pay

## 2021-01-10 ENCOUNTER — Ambulatory Visit (INDEPENDENT_AMBULATORY_CARE_PROVIDER_SITE_OTHER): Payer: Medicare HMO | Admitting: Family Medicine

## 2021-01-10 VITALS — BP 132/62 | HR 94 | Temp 97.6°F | Ht 62.0 in | Wt 218.0 lb

## 2021-01-10 DIAGNOSIS — M5090 Cervical disc disorder, unspecified, unspecified cervical region: Secondary | ICD-10-CM | POA: Diagnosis not present

## 2021-01-10 DIAGNOSIS — Z9989 Dependence on other enabling machines and devices: Secondary | ICD-10-CM

## 2021-01-10 DIAGNOSIS — R519 Headache, unspecified: Secondary | ICD-10-CM | POA: Insufficient documentation

## 2021-01-10 DIAGNOSIS — G4733 Obstructive sleep apnea (adult) (pediatric): Secondary | ICD-10-CM

## 2021-01-10 DIAGNOSIS — G43911 Migraine, unspecified, intractable, with status migrainosus: Secondary | ICD-10-CM | POA: Diagnosis not present

## 2021-01-10 MED ORDER — PROMETHAZINE HCL 25 MG PO TABS: 25.0000 mg | ORAL_TABLET | Freq: Once | ORAL | Status: DC

## 2021-01-10 MED ORDER — PROMETHAZINE HCL 25 MG/ML IJ SOLN
25.0000 mg | Freq: Once | INTRAMUSCULAR | Status: AC
  Administered 2021-01-10: 25 mg via INTRAMUSCULAR

## 2021-01-10 MED ORDER — KETOROLAC TROMETHAMINE 30 MG/ML IJ SOLN
30.0000 mg | Freq: Once | INTRAMUSCULAR | Status: AC
  Administered 2021-01-10: 30 mg via INTRAMUSCULAR

## 2021-01-10 MED ORDER — DEXAMETHASONE SODIUM PHOSPHATE 10 MG/ML IJ SOLN
10.0000 mg | Freq: Once | INTRAMUSCULAR | Status: AC
  Administered 2021-01-10: 10 mg via INTRAMUSCULAR

## 2021-01-10 NOTE — Patient Instructions (Addendum)
Steroid shot today (dexamethasone).  Toradol and phenergan shot for headache today.  Pending effect of above, we may start you on a new migraine medicine to take when you get a headache in addition to the topamax.  We will order brain MRI.  I recommend return to pulmonology to further discuss sleep apnea options  We will refer you back to PM&R to discuss possible neck component to headaches.  Keep appointment with neurology for August.  Keep me updated with how headaches do.

## 2021-01-10 NOTE — Assessment & Plan Note (Signed)
Worsening headaches presumed migraines despite continuing to titrate topamax dose now at 50/100mg  daily. Poor response to this along with abortive treatments to date (excedrin, tramadol, tylenol #3). Will Rx toradol 30mg /phenergan 25mg  IM today along with dexamethasone 10mg  IM hopefully to prevent recurrence.  Further evaluate for contributory causes of worsening headaches - untreated OSA, possible occipital neuralgia, known cervical DDD.  Given longevity of headaches and change in intensity (acute worsening over last few months), I will check brain MR to r/o other cause of worsening headache such as increased intracranial pressures, hydrocephalus, mass.  Consider gepant abortive and/or preventative therapy pending results. Avoid triptans due to cardiovascular history.

## 2021-01-10 NOTE — Assessment & Plan Note (Signed)
See above - ?occipital neuralgia - refer to PM&R to consider treatment of this.

## 2021-01-10 NOTE — Addendum Note (Signed)
Addended by: Brenton Grills on: 10/19/6008 93:23 PM   Modules accepted: Orders

## 2021-01-10 NOTE — Assessment & Plan Note (Signed)
Not on CPAP - has not been able to tolerate mask when previously used. Reviewed how untreated OSA can contribute to worsening headaches. I recommended return to sleep doctor to review treatment options, ?inspire device.

## 2021-01-10 NOTE — Progress Notes (Signed)
Patient ID: [name], female    DOB: 02-21-1951, 70 y.o.   MRN: [id number]  This visit was conducted in person.  BP 132/62   Pulse 94   Temp 97.6 F (36.4 C) (Temporal)   Ht 5\' 2"  (1.575 m)   Wt 218 lb (98.9 kg)   SpO2 96%   BMI 39.87 kg/m    CC: migraine  Subjective:   HPI: [name] is a 69 y.o. female presenting on 01/10/2021 for Migraine (C/o daily migraines. States neuro appt is not until 03/2021.)   Progressively worsening headaches presumed migraines since late last year, again acutely worse over the past month - constant headache present when she wakes up, progressively worse as day goes on. Describes severe pressure and sharp stabbing and throbbing ache pain at R occiput that radiates down into right neck, always on the right side. Activity limiting. Nausea and occasional vomiting with this. Occasional blurry vision. Photo/phonophobia.   No double vision, unilateral weakness, numbness or slurred speech.  Marked heat intolerance.  Lab Results  Component Value Date   TSH 1.37 09/14/2020    Eye exam 06/2020 - prescription updated. No papilledema at that time.  OSA not on CPAP. Only uses oxygen at night time. Has been unable to tolerate CPAP masks in the past.   Headaches started around 1970s.  Headaches at their worst when she was working for [employer]. Diagnosed with migraines at that time, had to miss a lot of work due to nausea/vomiting.   Upcoming [facility] neurology appt 03/2021.   Stopped coffee without significant improvement.  Managing with topamax 50mg  in am and 100mg  at night preventatively, and excedrin and tylenol#3 and tramadol PRN. Also treating with ice packs or alcohol pack onto occiput.   Husband drove her here today.      Relevant past medical, surgical, family and social history reviewed and updated as indicated. Interim medical history since our last visit reviewed. Allergies and medications reviewed and updated. Outpatient  Medications Prior to Visit  Medication Sig Dispense Refill  . albuterol (VENTOLIN HFA) 108 (90 Base) MCG/ACT inhaler Inhale 2 puffs into the lungs every 6 (six) hours as needed for wheezing or shortness of breath. 8 g 3  . Apple Cider Vinegar 600 MG CAPS     . aspirin 81 MG EC tablet Take 1 tablet (81 mg total) by mouth daily.    . Boswellia-Glucosamine-Vit D (OSTEO BI-FLEX ONE PER DAY PO) Take 1 tablet by mouth daily.    Marland Kitchen COVID-19 mRNA vaccine, Moderna, (MODERNA COVID-19 VACCINE) 100 MCG/0.5ML injection Inject into the muscle. 0.25 mL 0  . famotidine (PEPCID) 20 MG tablet Take 20 mg by mouth every other day. Alternates with omeprazole    . INS SYRINGE/NEEDLE 1CC/28G (B-D INS SYR MICROFINE 1CC/28G) 28G X 1/2" 1 ML MISC USE AS DIRECTED 100 each 3  . isosorbide mononitrate (IMDUR) 30 MG 24 hr tablet Take 0.5 tablets (15 mg total) by mouth daily. 15 tablet 4  . levothyroxine (SYNTHROID) 100 MCG tablet TAKE 1 TABLET DAILY, AND ON2 DAYS A WEEK, TAKE AN     EXTRA 1/2 TABLET 90 tablet 3  . loratadine (CLARITIN) 10 MG tablet Take 1 tablet (10 mg total) by mouth daily.    . metFORMIN (GLUCOPHAGE) 500 MG tablet Take 1 tablet (500 mg total) by mouth 2 (two) times daily with a meal. 180 tablet 1  . Multiple Vitamin (MULTIVITAMIN WITH MINERALS) TABS tablet Take 1 tablet by mouth  daily. One A Day for Women    . nystatin cream (MYCOSTATIN) APPLY TO AFFECTED AREA TWICE A DAY 30 g 1  . omeprazole (PRILOSEC) 20 MG capsule TAKE 1 DAILY-ALTERNATES WITH FAMOTIDINE 90 capsule 1  . ondansetron (ZOFRAN ODT) 4 MG disintegrating tablet Take 1 tablet by mouth every 4- 6 hours as needed 30 tablet 3  . ONE TOUCH LANCETS MISC Use to check sugar twice daily Dx: E11.8 200 each 3  . ONETOUCH ULTRA test strip USE TO CHECK BLOOD SUGAR 2 TIMES   DAILY 200 strip 3  . OXYGEN Inhale 2 L into the lungs at bedtime.    . pioglitazone (ACTOS) 15 MG tablet Take 1 tablet (15 mg total) by mouth daily. 90 tablet 1  . Rhubarb (ESTROVEN  COMPLETE PO) Take 1 tablet by mouth daily.    . simvastatin (ZOCOR) 40 MG tablet TAKE 1 TABLET AT BEDTIME 90 tablet 3  . topiramate (TOPAMAX) 100 MG tablet Take 0.5 tablets (50 mg total) by mouth daily AND 1 tablet (100 mg total) at bedtime. 135 tablet 1  . umeclidinium-vilanterol (ANORO ELLIPTA) 62.5-25 MCG/INH AEPB Inhale 1 puff into the lungs daily. 60 each 5  . ziprasidone (GEODON) 40 MG capsule Take 1 capsule (40 mg total) by mouth in the morning and at bedtime. 180 capsule 1  . insulin NPH Human (NOVOLIN N) 100 UNIT/ML injection Inject 0.1 mLs (10 Units total) into the skin daily after supper. (Patient not taking: Reported on 01/10/2021) 10 mL 3   No facility-administered medications prior to visit.     Per HPI unless specifically indicated in ROS section below Review of Systems Objective:  BP 132/62   Pulse 94   Temp 97.6 F (36.4 C) (Temporal)   Ht 5\' 2"  (1.575 m)   Wt 218 lb (98.9 kg)   SpO2 96%   BMI 39.87 kg/m   Wt Readings from Last 3 Encounters:  01/10/21 218 lb (98.9 kg)  12/27/20 221 lb 7 oz (100.4 kg)  12/06/20 222 lb (100.7 kg)      Physical Exam Vitals and nursing note reviewed.  Constitutional:      Appearance: Normal appearance. She is not ill-appearing.  HENT:     Head: Normocephalic and atraumatic.     Comments: Tenderness to palpation at R occiput into base of skull    Mouth/Throat:     Mouth: Mucous membranes are moist.     Pharynx: Oropharynx is clear. No oropharyngeal exudate or posterior oropharyngeal erythema.  Eyes:     General: Lids are normal.        Right eye: No discharge.        Left eye: No discharge.     Extraocular Movements: Extraocular movements intact.     Conjunctiva/sclera: Conjunctivae normal.     Pupils: Pupils are equal, round, and reactive to light.     Comments: Limited fundoscopic exam - unable to visualize optic discs  Neck:     Comments:  No midline spine pain Discomfort to palpation at trapezius Musculoskeletal:      Cervical back: Neck supple. Tenderness present. No rigidity.  Lymphadenopathy:     Cervical: No cervical adenopathy.  Skin:    General: Skin is warm and dry.     Findings: No rash.  Neurological:     General: No focal deficit present.     Mental Status: She is alert.     Cranial Nerves: Cranial nerves are intact.     Sensory: Sensation is  intact.     Motor: Motor function is intact.     Coordination: Coordination is intact. Romberg sign negative. Coordination normal.     Gait: Gait is intact.     Comments:  CN 2-12 intact EOMI FTN intact  Psychiatric:        Mood and Affect: Mood normal.        Behavior: Behavior normal.       Results for orders placed or performed in visit on 12/27/20  Microalbumin / creatinine urine ratio  Result Value Ref Range   Microalb, Ur 2.4 (H) 0.0 - 1.9 mg/dL   Creatinine,U 55.5 mg/dL   Microalb Creat Ratio 4.3 0.0 - 30.0 mg/g  POCT glycosylated hemoglobin (Hb A1C)  Result Value Ref Range   Hemoglobin A1C 5.6 4.0 - 5.6 %   HbA1c POC (<> result, manual entry)     HbA1c, POC (prediabetic range)     HbA1c, POC (controlled diabetic range)    POCT Glucose (Device for Home Use)  Result Value Ref Range   Glucose Fasting, POC     POC Glucose 114 (A) 70 - 99 mg/dl   Lab Results  Component Value Date   CREATININE 0.66 10/27/2020   BUN 17 10/27/2020   NA 139 10/27/2020   K 3.9 10/27/2020   CL 111 10/27/2020   CO2 21 (L) 10/27/2020    CT HEAD WITHOUT CONTRAST IMPRESSION: No acute intracranial pathology. Small-vessel white matter disease. Electronically Signed   By: Eddie Candle M.D.   On: 05/09/2020 14:38  Assessment & Plan:  This visit occurred during the SARS-CoV-2 public health emergency.  Safety protocols were in place, including screening questions prior to the visit, additional usage of staff PPE, and extensive cleaning of exam room while observing appropriate contact time as indicated for disinfecting solutions.   Problem List Items  Addressed This Visit    OSA on CPAP    Not on CPAP - has not been able to tolerate mask when previously used. Reviewed how untreated OSA can contribute to worsening headaches. I recommended return to sleep doctor to review treatment options, ?inspire device.      Migraine - Primary    Worsening headaches presumed migraines despite continuing to titrate topamax dose now at 50/100mg  daily. Poor response to this along with abortive treatments to date (excedrin, tramadol, tylenol #3). Will Rx toradol 30mg /phenergan 25mg  IM today along with dexamethasone 10mg  IM hopefully to prevent recurrence.  Further evaluate for contributory causes of worsening headaches - untreated OSA, possible occipital neuralgia, known cervical DDD.  Given longevity of headaches and change in intensity (acute worsening over last few months), I will check brain MR to r/o other cause of worsening headache such as increased intracranial pressures, hydrocephalus, mass.  Consider gepant abortive and/or preventative therapy pending results. Avoid triptans due to cardiovascular history.      Relevant Orders   MR Brain Wo Contrast   Cervical neck pain with evidence of disc disease   Unilateral occipital headache    See above - ?occipital neuralgia - refer to PM&R to consider treatment of this.       Relevant Orders   MR Brain Wo Contrast       Meds ordered this encounter  Medications  . dexamethasone (DECADRON) injection 10 mg  . ketorolac (TORADOL) 30 MG/ML injection 30 mg  . DISCONTD: promethazine (PHENERGAN) tablet 25 mg   Orders Placed This Encounter  Procedures  . MR Brain Wo Contrast    Standing  Status:   Future    Standing Expiration Date:   01/10/2022    Order Specific Question:   What is the patient's sedation requirement?    Answer:   No Sedation    Order Specific Question:   Does the patient have a pacemaker or implanted devices?    Answer:   No    Order Specific Question:   Preferred imaging location?     Answer:   [facility] (table limit-550lbs)    Patient Instructions  Steroid shot today (dexamethasone).  Toradol and phenergan shot for headache today.  Pending effect of above, we may start you on a new migraine medicine to take when you get a headache in addition to the topamax.  We will order brain MRI.  I recommend return to pulmonology to further discuss sleep apnea options  We will refer you back to PM&R to discuss possible neck component to headaches.  Keep appointment with neurology for August.  Keep me updated with how headaches do.   Follow up plan: Return if symptoms worsen or fail to improve.  Ria Bush, MD

## 2021-01-12 DIAGNOSIS — M1711 Unilateral primary osteoarthritis, right knee: Secondary | ICD-10-CM | POA: Diagnosis not present

## 2021-01-13 ENCOUNTER — Encounter: Payer: Self-pay | Admitting: Family Medicine

## 2021-01-14 ENCOUNTER — Encounter: Payer: Self-pay | Admitting: Family Medicine

## 2021-01-14 ENCOUNTER — Telehealth: Payer: Self-pay

## 2021-01-14 DIAGNOSIS — M1711 Unilateral primary osteoarthritis, right knee: Secondary | ICD-10-CM | POA: Diagnosis not present

## 2021-01-14 MED ORDER — NURTEC 75 MG PO TBDP: 1.0000 | ORAL_TABLET | Freq: Every day | ORAL | 3 refills | Status: DC | PRN

## 2021-01-14 NOTE — Telephone Encounter (Signed)
Per [facility], Nurtec ODT 75 mg tab needs PA.  Submitted PA; keyAndres Shad, Rx #:  [id number].  Decision pending.

## 2021-01-14 NOTE — Telephone Encounter (Signed)
Nurtec sent in.  Can you see if this requires PA? Thanks.

## 2021-01-14 NOTE — Telephone Encounter (Addendum)
Spoke with [facility] asking about Nurtec.  They confirm rx needs a PA.  (see TE, 01/14/21)

## 2021-01-14 NOTE — Telephone Encounter (Signed)
Received faxed PA approval, valid 08/21/2020- 08/20/2021.

## 2021-01-18 ENCOUNTER — Encounter: Payer: Self-pay | Admitting: Family Medicine

## 2021-01-18 NOTE — Telephone Encounter (Signed)
Morning - do you know of any pt assistance program for abortive gepants? Thanks!

## 2021-01-18 NOTE — Telephone Encounter (Signed)
Received faxed PA approval, valid 08/21/2020- 08/20/2021.  Made pharmacy aware.

## 2021-01-20 ENCOUNTER — Other Ambulatory Visit: Payer: Self-pay | Admitting: Pulmonary Disease

## 2021-01-22 ENCOUNTER — Encounter: Payer: Self-pay | Admitting: Family Medicine

## 2021-01-24 MED ORDER — CYCLOBENZAPRINE HCL 10 MG PO TABS: 5.0000 mg | ORAL_TABLET | Freq: Two times a day (BID) | ORAL | 0 refills | Status: DC | PRN

## 2021-01-24 NOTE — Addendum Note (Signed)
Addended by: Ria Bush on: 01/24/2021 07:45 AM   Modules accepted: Orders

## 2021-01-24 NOTE — Telephone Encounter (Signed)
Flexeril sent in. See other mychart message.

## 2021-01-26 ENCOUNTER — Encounter: Payer: Self-pay | Admitting: Family Medicine

## 2021-01-27 ENCOUNTER — Telehealth: Payer: Self-pay

## 2021-01-27 NOTE — Telephone Encounter (Signed)
Received PA approval, valid 08/21/2020- 04/27/2021.  Made pharmacy aware.

## 2021-01-27 NOTE — Telephone Encounter (Signed)
Received faxed PA request from [employer] for cyclobenzaprine 10 mg tab.  Submitted PA; key: [id number], Rx #: [id number].  Decision pending.

## 2021-01-28 ENCOUNTER — Ambulatory Visit
Admission: RE | Admit: 2021-01-28 | Discharge: 2021-01-28 | Disposition: A | Discharge status: Home / Self Care | Payer: Medicare HMO | Source: Ambulatory Visit | Attending: Family Medicine | Admitting: Family Medicine

## 2021-01-28 DIAGNOSIS — R519 Headache, unspecified: Secondary | ICD-10-CM

## 2021-01-28 DIAGNOSIS — G43911 Migraine, unspecified, intractable, with status migrainosus: Secondary | ICD-10-CM

## 2021-02-01 ENCOUNTER — Encounter: Payer: Self-pay | Admitting: Family Medicine

## 2021-02-01 NOTE — Telephone Encounter (Signed)
Ok to send. She also has access to results as she's in our system.

## 2021-02-01 NOTE — Telephone Encounter (Signed)
Ok to send results?

## 2021-02-02 NOTE — Telephone Encounter (Signed)
Faxed report, via Epic, to Dr. Jaynee Eagles.

## 2021-02-04 DIAGNOSIS — J449 Chronic obstructive pulmonary disease, unspecified: Secondary | ICD-10-CM | POA: Diagnosis not present

## 2021-02-16 ENCOUNTER — Other Ambulatory Visit: Payer: Self-pay

## 2021-02-16 ENCOUNTER — Ambulatory Visit: Payer: Medicare HMO | Admitting: Primary Care

## 2021-02-16 ENCOUNTER — Encounter: Payer: Self-pay | Admitting: Primary Care

## 2021-02-16 ENCOUNTER — Other Ambulatory Visit: Payer: Self-pay | Admitting: Family Medicine

## 2021-02-16 VITALS — BP 120/70 | HR 89 | Temp 98.0°F | Ht 62.0 in | Wt 227.0 lb

## 2021-02-16 DIAGNOSIS — G4733 Obstructive sleep apnea (adult) (pediatric): Secondary | ICD-10-CM

## 2021-02-16 DIAGNOSIS — Z8669 Personal history of other diseases of the nervous system and sense organs: Secondary | ICD-10-CM | POA: Diagnosis not present

## 2021-02-16 NOTE — Progress Notes (Signed)
@Patient  ID: [name], female    DOB: [birth year], 70 y.o.   MRN: [id number]  No chief complaint on file.   Referring provider: Ria Bush, MD  HPI: 70 year old female, former smoker. PMH significant for mild COPD, OSA, GERD, CAD s/p CABG, HTN, type 2 diabetes. Patient of Dr. Patsey Berthold, last seen in October 2021.   02/16/2021 Patient reports today for sleep consult. She reports symptoms of snoring and restless sleep. She had a sleep study in 2012 that showed moderate OSA.  Weight at that times was 267lbs. She had difficulty tolerating CPAP. She is a mouth breather. She has lost 40 lbs since last sleep study. She goes to bed at 9pm and wakes up every 2-3 hours to use the restroom. She wakes up for the first time around 10-11pm and watches TV. She has trouble falling back asleep. She gets out of bed in the morning around 8:30-9am. She is in and out of bed in the morning and takes a 30 min nap around 1pm. She tries and limits her intake of water in the evening. She takes topiramate at bedtime for hx of migraine headache. No benefit from melatonin. Advil PM has helped in the past. She is very anxious about having sleep study done, works herself into a panic. She is curious about inspire device.   Sleep questionnaire: Previous sleep study- NPSG 2012, AHI 24/hr weight 267lbs  Symptoms- Snoring, restless sleep Bedtime- 9-10:30 pm Time to fall asleep- varies  Nocturnal awakenings- 3-4 times Out of bed in morning- 8:30am Epworth- 5  Allergies  Allergen Reactions   Crestor [Rosuvastatin] Other (See Comments)    myalgias   Lipitor [Atorvastatin] Other (See Comments)    Mood swings   Lithium Other (See Comments)    abd pain, n/v, was hospitalized   Quetiapine Other (See Comments)    Muscle twitching, muscle spasm    Immunization History  Administered Date(s) Administered   Fluad Quad(high Dose 65+) 04/24/2019, 05/26/2020   Influenza Split 05/09/2011, 06/11/2012   Influenza  Whole 05/20/2010   Influenza,inj,Quad PF,6+ Mos 07/14/2013, 06/16/2014, 04/30/2015, 05/31/2016, 05/10/2017, 04/19/2018   Moderna SARS-COV2 Booster Vaccination 06/21/2020, 12/31/2020   Moderna Sars-Covid-2 Vaccination 10/18/2019, 11/15/2019   Pneumococcal Conjugate-13 11/14/2017, 04/19/2018   Pneumococcal Polysaccharide-23 04/18/2011   Td 04/18/2011   Zoster Recombinat (Shingrix) 11/14/2017, 01/02/2018   Zoster, Live 12/29/2013    Past Medical History:  Diagnosis Date   Anxiety    Arthritis    per patient "all over body"   Bipolar disorder ([place])    has stopped seeing psychiatrist   CAD (coronary artery disease)    a. 06/2020 MV: small, sev, apical inf/apical defect-scar vs ischemia. EF 45%; b. 06/2020 Cath: LM 50m, LAD 70ost, 74m, LCX nl, RCA nl; c. 07/2020 CABG x 1: LIMA->LAD.   Cataract    Chronic bronchitis    COPD (chronic obstructive pulmonary disease) (HCC)    Coronary artery disease, non-occlusive    Depression    Dyspnea 2012   s/p pulm/cards w/u WNL, thought due to obesity/deconditioning   Fibromyalgia    GERD (gastroesophageal reflux disease)    HFmrEF (heart failure with mid-range ejection fraction) ([place])    a. 05/2020 Echo: EF 40%, glob HK, mild LVH, Gr1 DD; b. 06/2020 EF by SPECT: 45%; c. 07/2020 intraop TEE: EF 50%.   History of chicken pox    HLD (hyperlipidemia)    Hypertension    Hypothyroidism    Ischemic cardiomyopathy    a. 05/2020  Echo: EF 40%; b. 06/2020 EF by SPECT: 45%; c. 07/2020 intraop TEE: EF 50%.   Migraine    OSA (obstructive sleep apnea) 03/16/2011   Pneumonia    Sleep apnea    per patient cannot tolerate CPAP   T2DM (type 2 diabetes mellitus) ([place]) 2012   DM education 06/2011   Urinary incontinence     Tobacco History: Social History   Tobacco Use  Smoking Status Former   Packs/day: 2.00   Years: 43.00   Pack years: 86.00   Types: Cigarettes   Quit date: 05/17/2010   Years since quitting: 10.7  Smokeless Tobacco Never  Tobacco  Comments   QUIT 2011   Counseling given: Not Answered Tobacco comments: QUIT 2011   Outpatient Medications Prior to Visit  Medication Sig Dispense Refill   albuterol (VENTOLIN HFA) 108 (90 Base) MCG/ACT inhaler Inhale 2 puffs into the lungs every 6 (six) hours as needed for wheezing or shortness of breath. 8 g 3   ANORO ELLIPTA 62.5-25 MCG/INH AEPB TAKE 1 PUFF BY MOUTH EVERY DAY 60 each 5   Apple Cider Vinegar 600 MG CAPS      aspirin 81 MG EC tablet Take 1 tablet (81 mg total) by mouth daily.     Boswellia-Glucosamine-Vit D (OSTEO BI-FLEX ONE PER DAY PO) Take 1 tablet by mouth daily.     COVID-19 mRNA vaccine, Moderna, (MODERNA COVID-19 VACCINE) 100 MCG/0.5ML injection Inject into the muscle. 0.25 mL 0   cyclobenzaprine (FLEXERIL) 10 MG tablet Take 0.5-1 tablets (5-10 mg total) by mouth 2 (two) times daily as needed for muscle spasms (migraine). 30 tablet 0   famotidine (PEPCID) 20 MG tablet Take 20 mg by mouth every other day. Alternates with omeprazole     INS SYRINGE/NEEDLE 1CC/28G (B-D INS SYR MICROFINE 1CC/28G) 28G X 1/2" 1 ML MISC USE AS DIRECTED 100 each 3   insulin NPH Human (NOVOLIN N) 100 UNIT/ML injection Inject 0.1 mLs (10 Units total) into the skin daily after supper. 10 mL 3   isosorbide mononitrate (IMDUR) 30 MG 24 hr tablet Take 0.5 tablets (15 mg total) by mouth daily. 15 tablet 4   levothyroxine (SYNTHROID) 100 MCG tablet TAKE 1 TABLET DAILY, AND ON2 DAYS A WEEK, TAKE AN     EXTRA 1/2 TABLET 90 tablet 3   loratadine (CLARITIN) 10 MG tablet Take 1 tablet (10 mg total) by mouth daily.     metFORMIN (GLUCOPHAGE) 500 MG tablet Take 1 tablet (500 mg total) by mouth 2 (two) times daily with a meal. 180 tablet 1   Multiple Vitamin (MULTIVITAMIN WITH MINERALS) TABS tablet Take 1 tablet by mouth daily. One A Day for Women     nystatin cream (MYCOSTATIN) APPLY TO AFFECTED AREA TWICE A DAY 30 g 1   omeprazole (PRILOSEC) 20 MG capsule TAKE 1 DAILY-ALTERNATES WITH FAMOTIDINE 90 capsule  1   ondansetron (ZOFRAN ODT) 4 MG disintegrating tablet Take 1 tablet by mouth every 4- 6 hours as needed 30 tablet 3   ONE TOUCH LANCETS MISC Use to check sugar twice daily Dx: E11.8 200 each 3   ONETOUCH ULTRA test strip USE TO CHECK BLOOD SUGAR 2 TIMES   DAILY 200 strip 3   OXYGEN Inhale 2 L into the lungs at bedtime.     pioglitazone (ACTOS) 15 MG tablet Take 1 tablet (15 mg total) by mouth daily. 90 tablet 1   Rhubarb (ESTROVEN COMPLETE PO) Take 1 tablet by mouth daily.  simvastatin (ZOCOR) 40 MG tablet TAKE 1 TABLET AT BEDTIME 90 tablet 3   topiramate (TOPAMAX) 100 MG tablet Take 0.5 tablets (50 mg total) by mouth daily AND 1 tablet (100 mg total) at bedtime. 135 tablet 1   ziprasidone (GEODON) 40 MG capsule Take 1 capsule (40 mg total) by mouth in the morning and at bedtime. 180 capsule 1   No facility-administered medications prior to visit.    Review of Systems  Review of Systems  Constitutional:  Positive for fatigue.  Respiratory: Negative.    Cardiovascular: Negative.   Psychiatric/Behavioral:  Positive for sleep disturbance. The patient is nervous/anxious.     Physical Exam  BP 120/70 (BP Location: Left Arm, Patient Position: Sitting, Cuff Size: Normal)   Pulse 89   Temp 98 F (36.7 C) (Temporal)   Ht 5\' 2"  (1.575 m)   Wt 227 lb (103 kg)   SpO2 98%   BMI 41.52 kg/m  Physical Exam Constitutional:      Appearance: Normal appearance. She is obese.  Cardiovascular:     Rate and Rhythm: Normal rate and regular rhythm.  Pulmonary:     Effort: Pulmonary effort is normal. No respiratory distress.     Breath sounds: No wheezing, rhonchi or rales.  Neurological:     General: No focal deficit present.     Mental Status: She is alert and oriented to person, place, and time. Mental status is at baseline.  Psychiatric:        Mood and Affect: Mood normal.        Behavior: Behavior normal.        Thought Content: Thought content normal.        Judgment: Judgment  normal.     Lab Results:  CBC    Component Value Date/Time   WBC 17.9 (H) 10/27/2020 0955   RBC 3.68 (L) 10/27/2020 0955   HGB 10.8 (L) 10/27/2020 0955   HGB 11.0 (L) 08/26/2020 1439   HCT 33.5 (L) 10/27/2020 0955   HCT 33.6 (L) 08/26/2020 1439   PLT 400 10/27/2020 0955   PLT 652 (H) 08/26/2020 1439   MCV 91.0 10/27/2020 0955   MCV 91 08/26/2020 1439   MCV 95 08/30/2012 0604   MCH 29.3 10/27/2020 0955   MCHC 32.2 10/27/2020 0955   RDW 16.4 (H) 10/27/2020 0955   RDW 13.2 08/26/2020 1439   RDW 13.4 08/30/2012 0604   LYMPHSABS 2,696 09/27/2020 1117   LYMPHSABS 1.9 08/30/2012 0604   MONOABS 0.9 09/14/2020 1315   MONOABS 1.2 (H) 08/30/2012 0604   EOSABS 159 09/27/2020 1117   EOSABS 0.1 08/30/2012 0604   BASOSABS 61 09/27/2020 1117   BASOSABS 0.1 08/30/2012 0604    BMET    Component Value Date/Time   NA 139 10/27/2020 0955   NA 137 08/26/2020 1439   NA 134 (L) 08/30/2012 0604   K 3.9 10/27/2020 0955   K 3.9 08/30/2012 0604   CL 111 10/27/2020 0955   CL 102 08/30/2012 0604   CO2 21 (L) 10/27/2020 0955   CO2 25 08/30/2012 0604   GLUCOSE 133 (H) 10/27/2020 0955   GLUCOSE 182 (H) 08/30/2012 0604   BUN 17 10/27/2020 0955   BUN 9 08/26/2020 1439   BUN 9 08/30/2012 0604   CREATININE 0.66 10/27/2020 0955   CREATININE 0.93 07/30/2015 1652   CALCIUM 9.2 10/27/2020 0955   CALCIUM 8.4 (L) 08/30/2012 0604   GFRNONAA >60 10/27/2020 0955   GFRNONAA >60 08/30/2012 0604   GFRAA  80 08/26/2020 1439   GFRAA >60 08/30/2012 0604    BNP No results found for: BNP  ProBNP    Component Value Date/Time   PROBNP 29.0 10/03/2019 1229    Imaging: MR Brain Wo Contrast  Result Date: 01/29/2021 CLINICAL DATA:  Frequent headaches EXAM: MRI HEAD WITHOUT CONTRAST TECHNIQUE: Multiplanar, multiecho pulse sequences of the brain and surrounding structures were obtained without intravenous contrast. COMPARISON:  None. FINDINGS: Brain: There is no acute infarction or intracranial hemorrhage.  There is no intracranial mass, mass effect, or edema. There is no hydrocephalus or extra-axial fluid collection. Ventricles and sulci are within normal limits in size and configuration. Vascular: Major vessel flow voids at the skull base are preserved. Skull and upper cervical spine: Normal marrow signal is preserved. Sinuses/Orbits: Minor mucosal thickening.  Orbits are unremarkable. Other: Sella is unremarkable.  Mastoid air cells are clear. IMPRESSION: No evidence of recent infarction, hemorrhage, mass, or other significant abnormality. Electronically Signed   By: Macy Mis M.D.   On: 01/29/2021 13:19     Assessment & Plan:   Hx of sleep apnea - NPSG in 2012 showed moderate OSA, poorly tolerated CPAP. She is a mouth breather. Reports restless sleep and snoring. Epworth score 5. Recommend repeat in lab split night sleep study to re-assess degree of underlying sleep apnea, patient uses oxygen at night. Advised patient limit napping to once a day 30-60 mins and advised she avoid putting TV on when waking up at night. Recommend she discuss trial off Topiramate as a side effect can be insomnia.  She can take advil PM as needed. She would not be a candidate for inspire d/t current BMI. Encourage she continue to work on weight loss efforts.  FU in August with Dr. Halford Chessman.    Martyn Ehrich, NP 02/16/2021

## 2021-02-16 NOTE — Progress Notes (Signed)
Reviewed and agree with assessment/plan.   Chesley Mires, MD [facility] Pulmonary/Critical Care 02/16/2021, 5:22 PM Pager:  [phone]

## 2021-02-16 NOTE — Assessment & Plan Note (Addendum)
-   NPSG in 2012 showed moderate OSA, poorly tolerated CPAP. She is a mouth breather. Reports restless sleep and snoring. Epworth score 5. Recommend repeat in lab split night sleep study to re-assess degree of underlying sleep apnea, patient uses oxygen at night. Advised patient limit napping to once a day 30-60 mins and advised she avoid putting TV on when waking up at night. Recommend she discuss trial off Topiramate as a side effect can be insomnia.  She can take advil PM as needed. She would not be a candidate for inspire d/t current BMI. Encourage she continue to work on weight loss efforts.  FU in August with Dr. Halford Chessman.

## 2021-02-16 NOTE — Patient Instructions (Addendum)
Recommendations: - Limit napping to once a day 30-60 mins  - Do not put TV back on when you wake up at night  - Continue to wear eye mask and ear plugs  - Discuss coming off topiramate with neurologist, 6-9% can experience insomnia symptoms  - You can take advil PM as needed   Orders: - Split night sleep study   Follow-up: - First available with Dr. Halford Chessman in August    Insomnia Insomnia is a sleep disorder that makes it difficult to fall asleep or stay asleep. Insomnia can cause fatigue, low energy, difficulty concentrating, moodswings, and poor performance at work or school. There are three different ways to classify insomnia: Difficulty falling asleep. Difficulty staying asleep. Waking up too early in the morning. Any type of insomnia can be long-term (chronic) or short-term (acute). Both are common. Short-term insomnia usually lasts for three months or less. Chronic insomnia occurs at least three times a week for longer than threemonths. What are the causes? Insomnia may be caused by another condition, situation, or substance, such as: Anxiety. Certain medicines. Gastroesophageal reflux disease (GERD) or other gastrointestinal conditions. Asthma or other breathing conditions. Restless legs syndrome, sleep apnea, or other sleep disorders. Chronic pain. Menopause. Stroke. Abuse of alcohol, tobacco, or illegal drugs. Mental health conditions, such as depression. Caffeine. Neurological disorders, such as Alzheimer's disease. An overactive thyroid (hyperthyroidism). Sometimes, the cause of insomnia may not be known. What increases the risk? Risk factors for insomnia include: Gender. Women are affected more often than men. Age. Insomnia is more common as you get older. Stress. Lack of exercise. Irregular work schedule or working night shifts. Traveling between different time zones. Certain medical and mental health conditions. What are the signs or symptoms? If you have  insomnia, the main symptom is having trouble falling asleep or having trouble staying asleep. This may lead to other symptoms, such as: Feeling fatigued or having low energy. Feeling nervous about going to sleep. Not feeling rested in the morning. Having trouble concentrating. Feeling irritable, anxious, or depressed. How is this diagnosed? This condition may be diagnosed based on: Your symptoms and medical history. Your health care provider may ask about: Your sleep habits. Any medical conditions you have. Your mental health. A physical exam. How is this treated? Treatment for insomnia depends on the cause. Treatment may focus on treating an underlying condition that is causing insomnia. Treatment may also include: Medicines to help you sleep. Counseling or therapy. Lifestyle adjustments to help you sleep better. Follow these instructions at home: Eating and drinking  Limit or avoid alcohol, caffeinated beverages, and cigarettes, especially close to bedtime. These can disrupt your sleep. Do not eat a large meal or eat spicy foods right before bedtime. This can lead to digestive discomfort that can make it hard for you to sleep.  Sleep habits  Keep a sleep diary to help you and your health care provider figure out what could be causing your insomnia. Write down: When you sleep. When you wake up during the night. How well you sleep. How rested you feel the next day. Any side effects of medicines you are taking. What you eat and drink. Make your bedroom a dark, comfortable place where it is easy to fall asleep. Put up shades or blackout curtains to block light from outside. Use a white noise machine to block noise. Keep the temperature cool. Limit screen use before bedtime. This includes: Watching TV. Using your smartphone, tablet, or computer. Stick to a  routine that includes going to bed and waking up at the same times every day and night. This can help you fall asleep faster.  Consider making a quiet activity, such as reading, part of your nighttime routine. Try to avoid taking naps during the day so that you sleep better at night. Get out of bed if you are still awake after 15 minutes of trying to sleep. Keep the lights down, but try reading or doing a quiet activity. When you feel sleepy, go back to bed.  General instructions Take over-the-counter and prescription medicines only as told by your health care provider. Exercise regularly, as told by your health care provider. Avoid exercise starting several hours before bedtime. Use relaxation techniques to manage stress. Ask your health care provider to suggest some techniques that may work well for you. These may include: Breathing exercises. Routines to release muscle tension. Visualizing peaceful scenes. Make sure that you drive carefully. Avoid driving if you feel very sleepy. Keep all follow-up visits as told by your health care provider. This is important. Contact a health care provider if: You are tired throughout the day. You have trouble in your daily routine due to sleepiness. You continue to have sleep problems, or your sleep problems get worse. Get help right away if: You have serious thoughts about hurting yourself or someone else. If you ever feel like you may hurt yourself or others, or have thoughts about taking your own life, get help right away. You can go to your nearest emergency department or call: Your local emergency services (911 in the [place] A suicide crisis helpline, such as the [place] at 202-621-0965. This is open 24 hours a day. Summary Insomnia is a sleep disorder that makes it difficult to fall asleep or stay asleep. Insomnia can be long-term (chronic) or short-term (acute). Treatment for insomnia depends on the cause. Treatment may focus on treating an underlying condition that is causing insomnia. Keep a sleep diary to help you and your health care  provider figure out what could be causing your insomnia. This information is not intended to replace advice given to you by your health care provider. Make sure you discuss any questions you have with your healthcare provider. Document Revised: 06/17/2020 Document Reviewed: 06/17/2020 Elsevier Patient Education  2022 [employer].

## 2021-02-24 ENCOUNTER — Ambulatory Visit: Payer: Medicare HMO | Admitting: Pulmonary Disease

## 2021-02-24 ENCOUNTER — Encounter: Payer: Self-pay | Admitting: Pulmonary Disease

## 2021-02-24 ENCOUNTER — Other Ambulatory Visit: Payer: Self-pay

## 2021-02-24 VITALS — BP 138/86 | HR 87 | Ht 62.0 in | Wt 226.0 lb

## 2021-02-24 DIAGNOSIS — R0602 Shortness of breath: Secondary | ICD-10-CM | POA: Diagnosis not present

## 2021-02-24 DIAGNOSIS — Z951 Presence of aortocoronary bypass graft: Secondary | ICD-10-CM

## 2021-02-24 DIAGNOSIS — J449 Chronic obstructive pulmonary disease, unspecified: Secondary | ICD-10-CM

## 2021-02-24 MED ORDER — TRELEGY ELLIPTA 100-62.5-25 MCG/INH IN AEPB: 1.0000 | INHALATION_SPRAY | Freq: Every day | RESPIRATORY_TRACT | 0 refills | Status: AC

## 2021-02-24 MED ORDER — ALBUTEROL SULFATE HFA 108 (90 BASE) MCG/ACT IN AERS: 2.0000 | INHALATION_SPRAY | Freq: Four times a day (QID) | RESPIRATORY_TRACT | 3 refills | Status: DC | PRN

## 2021-02-24 NOTE — Progress Notes (Signed)
Subjective:    Patient ID: [name], female    DOB: August 08, 1951, 70 y.o.   MRN: [id number]  Chief Complaint  Patient presents with   Follow-up    Sob with exertion.     HPI [name] is a 70 year old former smoker who presents today for follow-up on dyspnea.  This is a scheduled visit.  Has mild COPD and morbid obesity with significant deconditioning.  Her dyspnea is out of proportion to her PFTs.  She is on Anoro Ellipta does not note significant relief.  She has started to develop exertional chest pain again after she had bypass surgery in December 2021.  She does not have pleuritic component to the chest pain.  Frequent episodes of nocturia unchanged from prior.  He is to see cardiology tomorrow.  She has not had paroxysmal nocturnal dyspnea.  No fevers, chills or sweats.  Cough or sputum production, no hemoptysis.   Review of Systems A 10 point review of systems was performed and it is as noted above otherwise negative.  Patient Active Problem List   Diagnosis Date Noted   Unilateral occipital headache 01/10/2021   Abdominal aortic ectasia (HCC) 10/09/2020   Back pain 09/27/2020   Gross hematuria 09/14/2020   S/P CABG x 1 08/03/2020   Abnormal cardiovascular stress test    Aortic atherosclerosis ([place]) 04/15/2020   COPD (chronic obstructive pulmonary disease) ([place]) 04/15/2020   Pill dysphagia 04/05/2020   Cervical neck pain with evidence of disc disease 10/23/2019   Left hip pain 10/23/2019   Right knee pain 10/23/2019   Osteoarthritis 10/23/2019   Anemia 10/23/2019   Weight loss, unintentional 10/05/2019   Hepatic steatosis 05/03/2019   Thrombocytosis 04/24/2019   Abdominal pain 04/24/2019   Migraine 04/19/2018   Severe obesity (BMI 35.0-39.9) with comorbidity ([place]) 08/17/2017   Palpitations 01/08/2017   Mass of right side of neck 07/10/2016   Neutrophilia 04/30/2015   Advanced care planning/counseling discussion 01/22/2015   Health maintenance examination  01/22/2015   Medicare annual wellness visit, subsequent 12/15/2013   Tachycardia 09/13/2012   Osteoarthritis of left knee 12/11/2011   Candidal intertrigo 05/10/2011   Hypertension    Bipolar disorder ([place])    Fibromyalgia    Hyperlipidemia associated with type 2 diabetes mellitus (HCC)    GERD (gastroesophageal reflux disease)    Hypothyroidism 04/05/2011   Hx of sleep apnea 03/16/2011   CAD (coronary artery disease) 03/16/2011   Diabetes mellitus type 2, controlled, with complications ([place]) 70/48/8891   Dyspnea 01/03/2011   Social History   Tobacco Use   Smoking status: Former    Packs/day: 2.00    Years: 43.00    Pack years: 86.00    Types: Cigarettes    Quit date: 05/17/2010    Years since quitting: 10.7   Smokeless tobacco: Never   Tobacco comments:    QUIT 2011  Substance Use Topics   Alcohol use: No    Alcohol/week: 0.0 standard drinks   Allergies  Allergen Reactions   Crestor [Rosuvastatin] Other (See Comments)    myalgias   Lipitor [Atorvastatin] Other (See Comments)    Mood swings   Lithium Other (See Comments)    abd pain, n/v, was hospitalized   Quetiapine Other (See Comments)    Muscle twitching, muscle spasm   Current Meds  Medication Sig   albuterol (VENTOLIN HFA) 108 (90 Base) MCG/ACT inhaler Inhale 2 puffs into the lungs every 6 (six) hours as needed for wheezing or shortness of breath.  ANORO ELLIPTA 62.5-25 MCG/INH AEPB TAKE 1 PUFF BY MOUTH EVERY DAY   Apple Cider Vinegar 600 MG CAPS    aspirin 81 MG EC tablet Take 1 tablet (81 mg total) by mouth daily.   Boswellia-Glucosamine-Vit D (OSTEO BI-FLEX ONE PER DAY PO) Take 1 tablet by mouth daily.   COVID-19 mRNA vaccine, Moderna, (MODERNA COVID-19 VACCINE) 100 MCG/0.5ML injection Inject into the muscle.   cyclobenzaprine (FLEXERIL) 10 MG tablet Take 0.5-1 tablets (5-10 mg total) by mouth 2 (two) times daily as needed for muscle spasms (migraine).   famotidine (PEPCID) 20 MG tablet Take 20 mg by  mouth every other day. Alternates with omeprazole   isosorbide mononitrate (IMDUR) 30 MG 24 hr tablet Take 0.5 tablets (15 mg total) by mouth daily.   levothyroxine (SYNTHROID) 100 MCG tablet TAKE 1 TABLET DAILY, AND ON2 DAYS A WEEK, TAKE AN     EXTRA 1/2 TABLET   loratadine (CLARITIN) 10 MG tablet Take 1 tablet (10 mg total) by mouth daily.   metFORMIN (GLUCOPHAGE) 500 MG tablet Take 1 tablet (500 mg total) by mouth 2 (two) times daily with a meal.   Multiple Vitamin (MULTIVITAMIN WITH MINERALS) TABS tablet Take 1 tablet by mouth daily. One A Day for Women   nystatin cream (MYCOSTATIN) APPLY TO AFFECTED AREA TWICE A DAY   omeprazole (PRILOSEC) 20 MG capsule TAKE 1 DAILY-ALTERNATES WITH FAMOTIDINE   ondansetron (ZOFRAN ODT) 4 MG disintegrating tablet Take 1 tablet by mouth every 4- 6 hours as needed   ONE TOUCH LANCETS MISC Use to check sugar twice daily Dx: E11.8   ONETOUCH ULTRA test strip USE TO CHECK BLOOD SUGAR 2 TIMES   DAILY   OXYGEN Inhale 2 L into the lungs at bedtime.   pioglitazone (ACTOS) 15 MG tablet Take 1 tablet (15 mg total) by mouth daily.   Rhubarb (ESTROVEN COMPLETE PO) Take 1 tablet by mouth daily.   simvastatin (ZOCOR) 40 MG tablet TAKE 1 TABLET AT BEDTIME   topiramate (TOPAMAX) 100 MG tablet Take 0.5 tablets (50 mg total) by mouth daily AND 1 tablet (100 mg total) at bedtime.   ziprasidone (GEODON) 40 MG capsule Take 1 capsule (40 mg total) by mouth in the morning and at bedtime.   Immunization History  Administered Date(s) Administered   Fluad Quad(high Dose 65+) 04/24/2019, 05/26/2020   Influenza Split 05/09/2011, 06/11/2012   Influenza Whole 05/20/2010   Influenza,inj,Quad PF,6+ Mos 07/14/2013, 06/16/2014, 04/30/2015, 05/31/2016, 05/10/2017, 04/19/2018   Moderna SARS-COV2 Booster Vaccination 06/21/2020, 12/31/2020   Moderna Sars-Covid-2 Vaccination 10/18/2019, 11/15/2019   Pneumococcal Conjugate-13 11/14/2017, 04/19/2018   Pneumococcal Polysaccharide-23 04/18/2011    Td 04/18/2011   Zoster Recombinat (Shingrix) 11/14/2017, 01/02/2018   Zoster, Live 12/29/2013      Objective:   Physical Exam BP 138/86 (BP Location: Left Wrist, Cuff Size: Normal)   Pulse 87   Ht 5\' 2"  (1.575 m)   Wt 226 lb (102.5 kg)   SpO2 97%   BMI 41.34 kg/m   GENERAL: Obese woman, presents in transport chair.  No conversational dyspnea.  Looks older than stated age.  Looks very deconditioned.  Anxious. HEAD: Normocephalic, atraumatic. EYES: PERRL, anicteric sclera. MOUTH: Nose/mouth/throat not examined due to masking requirements for COVID 19. NECK: Supple. No thyromegaly. Trachea midline. No JVD.  No adenopathy.  Prominent dorsocervical fat pad posteriorly. PULMONARY: Good air entry bilaterally.  No adventitious sounds. CARDIOVASCULAR: S1 and S2. Regular rate and rhythm.  No rubs, murmurs or gallops heard. ABDOMEN: Obese, nondistended. MUSCULOSKELETAL:  No joint deformity, no clubbing, no edema.  Median sternotomy scar well-healed. NEUROLOGIC: No overt focal deficit.  Speech is fluent. SKIN: Intact,warm,dry. PSYCH: Depressed mood.  Anxious behavior.  She states she cannot ambulate for oximetry today.     Assessment & Plan:     ICD-10-CM   1. Stage 1 mild COPD by GOLD classification (HCC)  J44.9    Trial of Trelegy Ellipta Hold Anoro    2. Shortness of breath  R06.02    Multifactorial Cardiac plus deconditioning Obtain D-dimer and CBC Reassess with PFTs Work-up chest pain first    3. Morbid obesity ([place])  E66.01    Sarcopenic obesity This issue adds complexity to her management    4. S/P CABG x 1  Z95.21 July 2020      Meds ordered this encounter  Medications   albuterol (VENTOLIN HFA) 108 (90 Base) MCG/ACT inhaler    Sig: Inhale 2 puffs into the lungs every 6 (six) hours as needed for wheezing or shortness of breath.    Dispense:  18 g    Refill:  3   Fluticasone-Umeclidin-Vilant (TRELEGY ELLIPTA) 100-62.5-25 MCG/INH AEPB    Sig: Inhale 1  puff into the lungs daily for 1 day.    Dispense:  14 each    Refill:  0    Order Specific Question:   Lot Number?    Answer:   [id number]    Order Specific Question:   Expiration Date?    Answer:   09/21/2022    Order Specific Question:   Manufacturer?    Answer:   [employer] [[id number]]    Order Specific Question:   Quantity    Answer:   1   Patient's shortness of breath is out of proportion to her PFT abnormalities.  She has issues with morbid obesity and deconditioning.  In addition she has significant cardiac disease.  Will obtain D-dimer and CBC for completeness, she has follow-up appointment with cardiology tomorrow.  If D-dimer and CBC are abnormal consider CT angio chest if no other abnormalities are noted to evaluation by cardiology.  Renold Don, MD [facility]   *This note was dictated using voice recognition software/Dragon.  Despite best efforts to proofread, errors can occur which can change the meaning.  Any change was purely unintentional.

## 2021-02-24 NOTE — Patient Instructions (Addendum)
We are giving you a trial of Trelegy Ellipta 100/62.5/25 1 puff daily, make sure you rinse your mouth well after you use it.  Sure you call us and let us know how you are doing with this.  DO NOT USE ANORO WHILE USING TRELEGY.  Think your shortness of breath may be more related to your heart.  We we will see you in follow-up in 2 months time call sooner should any new problems arise.

## 2021-02-25 ENCOUNTER — Ambulatory Visit: Payer: Medicare HMO | Admitting: Cardiology

## 2021-02-25 ENCOUNTER — Encounter: Payer: Self-pay | Admitting: Cardiology

## 2021-02-25 ENCOUNTER — Other Ambulatory Visit
Admission: RE | Admit: 2021-02-25 | Discharge: 2021-02-25 | Disposition: A | Discharge status: Home / Self Care | Payer: Medicare HMO | Attending: Pulmonary Disease | Admitting: Pulmonary Disease

## 2021-02-25 VITALS — BP 130/60 | HR 78 | Ht 62.0 in | Wt 226.0 lb

## 2021-02-25 DIAGNOSIS — R0602 Shortness of breath: Secondary | ICD-10-CM

## 2021-02-25 DIAGNOSIS — E785 Hyperlipidemia, unspecified: Secondary | ICD-10-CM | POA: Diagnosis not present

## 2021-02-25 DIAGNOSIS — R0600 Dyspnea, unspecified: Secondary | ICD-10-CM

## 2021-02-25 DIAGNOSIS — I2510 Atherosclerotic heart disease of native coronary artery without angina pectoris: Secondary | ICD-10-CM | POA: Diagnosis not present

## 2021-02-25 DIAGNOSIS — R0609 Other forms of dyspnea: Secondary | ICD-10-CM

## 2021-02-25 LAB — CBC WITH DIFFERENTIAL/PLATELET
Abs Immature Granulocytes: 0.07 10*3/uL (ref 0.00–0.07)
Basophils Absolute: 0.1 10*3/uL (ref 0.0–0.1)
Basophils Relative: 1 %
Eosinophils Absolute: 0.3 10*3/uL (ref 0.0–0.5)
Eosinophils Relative: 2 %
HCT: 33.1 % — ABNORMAL LOW (ref 36.0–46.0)
Hemoglobin: 10.7 g/dL — ABNORMAL LOW (ref 12.0–15.0)
Immature Granulocytes: 1 %
Lymphocytes Relative: 21 %
Lymphs Abs: 2.5 10*3/uL (ref 0.7–4.0)
MCH: 29.5 pg (ref 26.0–34.0)
MCHC: 32.3 g/dL (ref 30.0–36.0)
MCV: 91.2 fL (ref 80.0–100.0)
Monocytes Absolute: 0.7 10*3/uL (ref 0.1–1.0)
Monocytes Relative: 6 %
Neutro Abs: 8 10*3/uL — ABNORMAL HIGH (ref 1.7–7.7)
Neutrophils Relative %: 69 %
Platelets: 427 10*3/uL — ABNORMAL HIGH (ref 150–400)
RBC: 3.63 MIL/uL — ABNORMAL LOW (ref 3.87–5.11)
RDW: 13.4 % (ref 11.5–15.5)
WBC: 11.7 10*3/uL — ABNORMAL HIGH (ref 4.0–10.5)
nRBC: 0 % (ref 0.0–0.2)

## 2021-02-25 LAB — D-DIMER, QUANTITATIVE: D-Dimer, Quant: 0.59 ug/mL-FEU — ABNORMAL HIGH (ref 0.00–0.50)

## 2021-02-25 MED ORDER — ISOSORBIDE MONONITRATE ER 30 MG PO TB24: 30.0000 mg | ORAL_TABLET | Freq: Every day | ORAL | 3 refills | Status: DC

## 2021-02-25 MED ORDER — TORSEMIDE 20 MG PO TABS: 20.0000 mg | ORAL_TABLET | Freq: Every day | ORAL | 5 refills | Status: DC

## 2021-02-25 NOTE — Addendum Note (Signed)
Addended by: Claudette Head A on: 02/25/2021 07:59 AM   Modules accepted: Orders

## 2021-02-25 NOTE — Progress Notes (Signed)
Cardiology Office Note:    Date:  02/25/2021   ID:  [name], DOB Jul 03, 1951, MRN [id number]  PCP:  Ria Bush, MD  [facility] Cardiologist:  Kate Sable, MD  [place] Electrophysiologist:  None   Referring MD: Ria Bush, MD   Chief Complaint  Patient presents with   Other    Early 6 month follow up. Patient c.o chest pain and SOB. Meds reviewed verbally with patient.     History of Present Illness:    [name] is a 70 y.o. female with a hx of CAD/CABG x 1 (LIMA-LAD, 07/2020), hypertension, hyperlipidemia, diabetes, former smoker x40 years, COPD, OSA who presents for follow-up.  Patient states having worsening symptoms of shortness of breath and chest tightness associated with exertion.  Symptoms of shortness of breath have been ongoing for years.  She had CAD/CABG in January 2021.  Endorsed having shortness of breath with exertion prior to these and also after.  Previously had musculoskeletal chest pain, improved with NSAIDs.  She states having chest tightness with minimal exertion.  Imdur was decreased to 12.5 mg daily due to low blood pressures.  Systolic blood pressures have been in the 130s at home.  Compliant with CPAP mask.   Prior notes Intraoperative TEE, EF approximately 50%. Right and left heart cath 06/2020 single-vessel CAD sequential 70% ostial, 90% mid LAD. CAD/CABG x1 LIMA to LAD 07/2020 Lexiscan Myoview 11/09/2020 with no significant ischemia, fixed apical defect.   Past Medical History:  Diagnosis Date   Anxiety    Arthritis    per patient "all over body"   Bipolar disorder [facility])    has stopped seeing psychiatrist   CAD (coronary artery disease)    a. 06/2020 MV: small, sev, apical inf/apical defect-scar vs ischemia. EF 45%; b. 06/2020 Cath: LM 75m, LAD 70ost, 52m, LCX nl, RCA nl; c. 07/2020 CABG x 1: LIMA->LAD.   Cataract    Chronic bronchitis    COPD (chronic obstructive pulmonary disease) (HCC)    Coronary  artery disease, non-occlusive    Depression    Dyspnea 2012   s/p pulm/cards w/u WNL, thought due to obesity/deconditioning   Fibromyalgia    GERD (gastroesophageal reflux disease)    HFmrEF (heart failure with mid-range ejection fraction) ([place])    a. 05/2020 Echo: EF 40%, glob HK, mild LVH, Gr1 DD; b. 06/2020 EF by SPECT: 45%; c. 07/2020 intraop TEE: EF 50%.   History of chicken pox    HLD (hyperlipidemia)    Hypertension    Hypothyroidism    Ischemic cardiomyopathy    a. 05/2020 Echo: EF 40%; b. 06/2020 EF by SPECT: 45%; c. 07/2020 intraop TEE: EF 50%.   Migraine    OSA (obstructive sleep apnea) 03/16/2011   Pneumonia    Sleep apnea    per patient cannot tolerate CPAP   T2DM (type 2 diabetes mellitus) ([place]) 2012   DM education 06/2011   Urinary incontinence     Past Surgical History:  Procedure Laterality Date   BREAST CYST ASPIRATION Right 2003   cardiac catherization  03/2011   x3 [facility]), mild nonobstructive CAD   COLONOSCOPY  12/2011   hyperplastic polyp x2,. diverticulosis, rec rpt 10 yrs   CORONARY ARTERY BYPASS GRAFT N/A 08/03/2020   Procedure: CORONARY ARTERY BYPASS GRAFTING TIMES ONE USING LEFT INTERNAL MAMMARY ARTERY.;  Surgeon: Gaye Pollack, MD;  Location: [facility] OR;  Service: Open Heart Surgery;  Laterality: N/A;   ESOPHAGOGASTRODUODENOSCOPY  2006   ESOPHAGOGASTRODUODENOSCOPY  05/2020   no esophageal abnormality to explain dysphagia - esophagus dilated. Erosive gastropathy - neg H pylori [facility])   KNEE SURGERY  2006   left, torn meniscus   RIGHT/LEFT HEART CATH AND CORONARY ANGIOGRAPHY N/A 07/13/2020   Procedure: RIGHT/LEFT HEART CATH AND CORONARY ANGIOGRAPHY;  Surgeon: Nelva Bush, MD;  Location: [facility];  Service: Cardiovascular;  Laterality: N/A;   TEE WITHOUT CARDIOVERSION N/A 08/03/2020   Procedure: TRANSESOPHAGEAL ECHOCARDIOGRAM (TEE);  Surgeon: Gaye Pollack, MD;  [facility] [facility];  Service: Open Heart Surgery;  Laterality: N/A;   TOTAL  ABDOMINAL HYSTERECTOMY  2004   fibroids, cervix remained   TOTAL KNEE ARTHROPLASTY Left 08/2012   [name], [facility]    Current Medications: Current Meds  Medication Sig   albuterol (VENTOLIN HFA) 108 (90 Base) MCG/ACT inhaler Inhale 2 puffs into the lungs every 6 (six) hours as needed for wheezing or shortness of breath.   Apple Cider Vinegar 600 MG CAPS    aspirin 81 MG EC tablet Take 1 tablet (81 mg total) by mouth daily.   Boswellia-Glucosamine-Vit D (OSTEO BI-FLEX ONE PER DAY PO) Take 1 tablet by mouth daily.   COVID-19 mRNA vaccine, Moderna, (MODERNA COVID-19 VACCINE) 100 MCG/0.5ML injection Inject into the muscle.   cyclobenzaprine (FLEXERIL) 10 MG tablet Take 0.5-1 tablets (5-10 mg total) by mouth 2 (two) times daily as needed for muscle spasms (migraine).   famotidine (PEPCID) 20 MG tablet Take 20 mg by mouth every other day. Alternates with omeprazole   Fluticasone-Umeclidin-Vilant (TRELEGY ELLIPTA) 100-62.5-25 MCG/INH AEPB Inhale 1 puff into the lungs daily for 1 day.   levothyroxine (SYNTHROID) 100 MCG tablet TAKE 1 TABLET DAILY, AND ON2 DAYS A WEEK, TAKE AN     EXTRA 1/2 TABLET   loratadine (CLARITIN) 10 MG tablet Take 1 tablet (10 mg total) by mouth daily.   metFORMIN (GLUCOPHAGE) 500 MG tablet Take 1 tablet (500 mg total) by mouth 2 (two) times daily with a meal.   Multiple Vitamin (MULTIVITAMIN WITH MINERALS) TABS tablet Take 1 tablet by mouth daily. One A Day for Women   nystatin cream (MYCOSTATIN) APPLY TO AFFECTED AREA TWICE A DAY   omeprazole (PRILOSEC) 20 MG capsule TAKE 1 DAILY-ALTERNATES WITH FAMOTIDINE   ondansetron (ZOFRAN ODT) 4 MG disintegrating tablet Take 1 tablet by mouth every 4- 6 hours as needed   ONE TOUCH LANCETS MISC Use to check sugar twice daily Dx: E11.8   ONETOUCH ULTRA test strip USE TO CHECK BLOOD SUGAR 2 TIMES   DAILY   OXYGEN Inhale 2 L into the lungs at bedtime.   pioglitazone (ACTOS) 15 MG tablet Take 1 tablet (15 mg total) by mouth daily.    Rhubarb (ESTROVEN COMPLETE PO) Take 1 tablet by mouth daily.   simvastatin (ZOCOR) 40 MG tablet TAKE 1 TABLET AT BEDTIME   topiramate (TOPAMAX) 100 MG tablet Take 0.5 tablets (50 mg total) by mouth daily AND 1 tablet (100 mg total) at bedtime.   torsemide (DEMADEX) 20 MG tablet Take 1 tablet (20 mg total) by mouth daily.   ziprasidone (GEODON) 40 MG capsule Take 1 capsule (40 mg total) by mouth in the morning and at bedtime.   [DISCONTINUED] isosorbide mononitrate (IMDUR) 30 MG 24 hr tablet Take 0.5 tablets (15 mg total) by mouth daily.     Allergies:   Crestor [rosuvastatin], Lipitor [atorvastatin], Lithium, and Quetiapine   Social History   Socioeconomic History   Marital status: Married    Spouse name:  Not on file   Number of children: Not on file   Years of education: Not on file   Highest education level: Not on file  Occupational History   Occupation: disabled    Employer: OTHER  Tobacco Use   Smoking status: Former    Packs/day: 2.00    Years: 43.00    Pack years: 86.00    Types: Cigarettes    Quit date: 05/17/2010    Years since quitting: 10.7   Smokeless tobacco: Never   Tobacco comments:    QUIT 2011  Vaping Use   Vaping Use: Never used  Substance and Sexual Activity   Alcohol use: No    Alcohol/week: 0.0 standard drinks   Drug use: No   Sexual activity: Yes    Partners: Male  Other Topics Concern   Not on file  Social History Narrative   Caffeine: 2-3 coffee/am, 1 cup tea in afternoon; Lives with husband, 1 dog, 2 cats; Occupation: disability since 2009 for bipolar, previously worked at [employer]; Diet: not many fruits, good vegetables, good amt water.      Smoker 43 years; quit in 2011. No alcohol. Lives in [place]; with husband.    Social Determinants of Health   Financial Resource Strain: Low Risk    Difficulty of Paying Living Expenses: Not hard at all  Food Insecurity: No Food Insecurity   Worried About Charity fundraiser in the Last Year: Never  true   [place] in the Last Year: Never true  Transportation Needs: No Transportation Needs   Lack of Transportation (Medical): No   Lack of Transportation (Non-Medical): No  Physical Activity: Inactive   Days of Exercise per Week: 0 days   Minutes of Exercise per Session: 0 min  Stress: No Stress Concern Present   Feeling of Stress : Not at all  Social Connections: Not on file     Family History: The patient's family history includes Acute lymphoblastic leukemia (age of onset: 11) in her cousin; Aneurysm in her maternal uncle; Arthritis in her mother; Asthma in her maternal uncle; Breast cancer in her maternal aunt; COPD in her sister; Cancer in her maternal uncle and paternal uncle; Cancer (age of onset: 91) in her father; Cancer (age of onset: 21) in her maternal aunt; Colon cancer (age of onset: 27) in her maternal uncle; Diabetes in her father; Hypertension in her mother; Stroke in her mother; Thyroid disease in her father. There is no history of Coronary artery disease, Stomach cancer, Rectal cancer, or Esophageal cancer.  ROS:   Please see the history of present illness.     All other systems reviewed and are negative.  EKGs/Labs/Other Studies Reviewed:    The following studies were reviewed today:   EKG:  EKG is  ordered today.  The ekg ordered today demonstrates sinus rhythm  Recent Labs: 08/04/2020: Magnesium 1.8 09/14/2020: ALT 9; TSH 1.37 10/27/2020: BUN 17; Creatinine, Ser 0.66; Potassium 3.9; Sodium 139 02/25/2021: Hemoglobin 10.7; Platelets 427  Recent Lipid Panel    Component Value Date/Time   CHOL 131 05/21/2020 0915   TRIG 239.0 (H) 05/21/2020 0915   HDL 42.80 05/21/2020 0915   CHOLHDL 3 05/21/2020 0915   VLDL 47.8 (H) 05/21/2020 0915   LDLCALC 48 12/08/2013 0824   LDLDIRECT 65.0 05/21/2020 0915     Risk Assessment/Calculations:      Physical Exam:    VS:  BP 130/60 (BP Location: Right Arm, Patient Position: Sitting, Cuff Size: Normal)  Pulse 78    Ht 5\' 2"  (1.575 m)   Wt 226 lb (102.5 kg)   SpO2 98%   BMI 41.34 kg/m     Wt Readings from Last 3 Encounters:  02/25/21 226 lb (102.5 kg)  02/24/21 226 lb (102.5 kg)  02/16/21 227 lb (103 kg)     GEN:  Well nourished, well developed in no acute distress HEENT: Normal NECK: No JVD; No carotid bruits CARDIAC: RRR, no murmurs, rubs, gallops RESPIRATORY:  Clear to auscultation, decreased breath sounds at bases ABDOMEN: Soft, non-tender, distended MUSCULOSKELETAL:  No edema; left-sided chest tenderness noted. SKIN: Warm and dry NEUROLOGIC:  Alert and oriented x 3 PSYCHIATRIC:  Normal affect   ASSESSMENT:    1. Coronary artery disease involving native coronary artery of native heart, unspecified whether angina present   2. Hyperlipidemia LDL goal <70   3. Obesity, morbid, BMI 40.0-49.9 ([place])   4. Dyspnea on exertion     PLAN:    In order of problems listed above:  CAD/CABG x1 on 07/2020.  Last EF on intraprocedural TEE 07/2020, EF 50%.  Endorses having chest pain with exertion.  Increase Imdur to 30 mg daily, continue aspirin, statin.  May consider left heart cath if symptoms of chest pain persist.  A component of anxiety could also be contributing. hyperlipidemia.  LDL at goal, continue simvastatin. morbidly obese, weight loss, low-calorie diet recommended. Dyspnea on exertion, echo with preserved EF, diastolic function not mentioned.  Abdominal distention noted on exam, lungs appear clear.  Endorses orthopnea.  Start torsemide 20 mg daily in attempt to help with shortness of breath.  Obviously OSA, obesity, COPD contributing.  Follow-up in 2 weeks.   Medication Adjustments/Labs and Tests Ordered: Current medicines are reviewed at length with the patient today.  Concerns regarding medicines are outlined above.  Orders Placed This Encounter  Procedures   Basic metabolic panel   EKG [id number]    Meds ordered this encounter  Medications   isosorbide mononitrate (IMDUR)  30 MG 24 hr tablet    Sig: Take 1 tablet (30 mg total) by mouth daily.    Dispense:  90 tablet    Refill:  3   torsemide (DEMADEX) 20 MG tablet    Sig: Take 1 tablet (20 mg total) by mouth daily.    Dispense:  30 tablet    Refill:  5     Patient Instructions  Medication Instructions:   Your physician has recommended you make the following change in your medication:    INCREASE your IMDUR to 30 MG once a day.  2.   START taking Torsemide 20 MG once a day.   *If you need a refill on your cardiac medications before your next appointment, please call your pharmacy*   Lab Work:  Your physician recommends that you return for lab work in: 1 week (03/04/21)  - [facility] to the [facility]. You will check in at the front desk to the right as you walk into the atrium. Valet Parking is offered if needed. - No appointment needed. You may go any day between 7 am and 6 pm.    Testing/Procedures: None ordered   Follow-Up: At [facility], you and your health needs are our priority.  As part of our continuing mission to provide you with exceptional heart care, we have created designated Provider Care Teams.  These Care Teams include your primary Cardiologist (physician) and Advanced Practice Providers (APPs -  Physician Assistants and Nurse  Practitioners) who all work together to provide you with the care you need, when you need it.  We recommend signing up for the patient portal called "MyChart".  Sign up information is provided on this After Visit Summary.  MyChart is used to connect with patients for Virtual Visits (Telemedicine).  Patients are able to view lab/test results, encounter notes, upcoming appointments, etc.  Non-urgent messages can be sent to your provider as well.   To learn more about what you can do with MyChart, go to [email or web address].    Your next appointment:   2 week(s) may add on to a tues if needed  The format for your next appointment:   In  Person  Provider:   Kate Sable, MD   Other Instructions    Signed, Kate Sable, MD  02/25/2021 12:56 PM    [place]

## 2021-02-25 NOTE — Patient Instructions (Signed)
Medication Instructions:   Your physician has recommended you make the following change in your medication:    INCREASE your IMDUR to 30 MG once a day.  2.   START taking Torsemide 20 MG once a day.   *If you need a refill on your cardiac medications before your next appointment, please call your pharmacy*   Lab Work:  Your physician recommends that you return for lab work in: 1 week (03/04/21)  - Please go to the [facility]. You will check in at the front desk to the right as you walk into the atrium. Valet Parking is offered if needed. - No appointment needed. You may go any day between 7 am and 6 pm.    Testing/Procedures: None ordered   Follow-Up: At [facility], you and your health needs are our priority.  As part of our continuing mission to provide you with exceptional heart care, we have created designated Provider Care Teams.  These Care Teams include your primary Cardiologist (physician) and Advanced Practice Providers (APPs -  Physician Assistants and Nurse Practitioners) who all work together to provide you with the care you need, when you need it.  We recommend signing up for the patient portal called "MyChart".  Sign up information is provided on this After Visit Summary.  MyChart is used to connect with patients for Virtual Visits (Telemedicine).  Patients are able to view lab/test results, encounter notes, upcoming appointments, etc.  Non-urgent messages can be sent to your provider as well.   To learn more about what you can do with MyChart, go to [email or web address].    Your next appointment:   2 week(s) may add on to a tues if needed  The format for your next appointment:   In Person  Provider:   Kate Sable, MD   Other Instructions

## 2021-03-03 ENCOUNTER — Telehealth: Payer: Self-pay | Admitting: Cardiology

## 2021-03-03 NOTE — Telephone Encounter (Signed)
Called patient and informed her that she should proceed with getting the lab work as planned as this is related to her starting the Torsemide and not the Isosorbide. Patient verbalized understanding,  was grateful for the call back.

## 2021-03-03 NOTE — Telephone Encounter (Signed)
Patient calling in to let Dr.Agbor-Etang know that her increased dosage of Isosorbide just arrived in the mail 7/13. Patient has not started the increased dosage and is planning to start on 7/17. Patient is wanting to know if she should still get lab work done this week or wait until after her medication change  Please advise

## 2021-03-04 ENCOUNTER — Other Ambulatory Visit
Admission: RE | Admit: 2021-03-04 | Discharge: 2021-03-04 | Disposition: A | Discharge status: Home / Self Care | Payer: Medicare HMO | Attending: Cardiology | Admitting: Cardiology

## 2021-03-04 DIAGNOSIS — I2510 Atherosclerotic heart disease of native coronary artery without angina pectoris: Secondary | ICD-10-CM | POA: Diagnosis not present

## 2021-03-04 LAB — BASIC METABOLIC PANEL
Anion gap: 10 (ref 5–15)
BUN: 19 mg/dL (ref 8–23)
CO2: 22 mmol/L (ref 22–32)
Calcium: 8.8 mg/dL — ABNORMAL LOW (ref 8.9–10.3)
Chloride: 105 mmol/L (ref 98–111)
Creatinine, Ser: 0.93 mg/dL (ref 0.44–1.00)
GFR, Estimated: >60 mL/min (ref >60)
Glucose, Bld: 128 mg/dL — ABNORMAL HIGH (ref 70–99)
Potassium: 4 mmol/L (ref 3.5–5.1)
Sodium: 137 mmol/L (ref 135–145)

## 2021-03-06 DIAGNOSIS — J449 Chronic obstructive pulmonary disease, unspecified: Secondary | ICD-10-CM | POA: Diagnosis not present

## 2021-03-07 ENCOUNTER — Ambulatory Visit: Payer: Self-pay | Admitting: Neurology

## 2021-03-07 ENCOUNTER — Ambulatory Visit: Payer: Medicare HMO | Admitting: Neurology

## 2021-03-07 ENCOUNTER — Other Ambulatory Visit: Payer: Self-pay

## 2021-03-07 ENCOUNTER — Encounter: Payer: Self-pay | Admitting: Neurology

## 2021-03-07 ENCOUNTER — Other Ambulatory Visit: Payer: Self-pay | Admitting: Family Medicine

## 2021-03-07 VITALS — BP 112/68 | HR 78 | Ht 62.0 in | Wt 230.6 lb

## 2021-03-07 DIAGNOSIS — G43711 Chronic migraine without aura, intractable, with status migrainosus: Secondary | ICD-10-CM | POA: Diagnosis not present

## 2021-03-07 MED ORDER — AJOVY 225 MG/1.5ML ~~LOC~~ SOAJ: 225.0000 mg | SUBCUTANEOUS | 0 refills | Status: DC

## 2021-03-07 NOTE — Patient Instructions (Addendum)
Ajovy - 4 months of samples In the 4 months, get sleep apnea taken care of We may need to get patient assistance for the Ajovy  Fremanezumab injection What is this medication? FREMANEZUMAB (fre ma NEZ ue mab) is used to prevent migraine headaches. This medicine may be used for other purposes; ask your health care provider orpharmacist if you have questions. COMMON BRAND NAME(S): AJOVY What should I tell my care team before I take this medication? They need to know if you have any of these conditions: an unusual or allergic reaction to fremanezumab, other medicines, foods, dyes, or preservatives pregnant or trying to get pregnant breast-feeding How should I use this medication? This medicine is for injection under the skin. You will be taught how to prepare and give this medicine. Use exactly as directed. Take your medicine atregular intervals. Do not take your medicine more often than directed. It is important that you put your used needles and syringes in a special sharps container. Do not put them in a trash can. If you do not have a sharpscontainer, call your pharmacist or healthcare provider to get one. Talk to your pediatrician regarding the use of this medicine in children.Special care may be needed. Overdosage: If you think you have taken too much of this medicine contact apoison control center or emergency room at once. NOTE: This medicine is only for you. Do not share this medicine with others. What if I miss a dose? If you miss a dose, take it as soon as you can. If it is almost time for yournext dose, take only that dose. Do not take double or extra doses. What may interact with this medication? Interactions are not expected. This list may not describe all possible interactions. Give your health care provider a list of all the medicines, herbs, non-prescription drugs, or dietary supplements you use. Also tell them if you smoke, drink alcohol, or use illegaldrugs. Some items may  interact with your medicine. What should I watch for while using this medication? Tell your doctor or healthcare professional if your symptoms do not start toget better or if they get worse. What side effects may I notice from receiving this medication? Side effects that you should report to your doctor or health care professionalas soon as possible: allergic reactions like skin rash, itching or hives, swelling of the face, lips, or tongue Side effects that usually do not require medical attention (report these toyour doctor or health care professional if they continue or are bothersome): pain, redness, or irritation at site where injected This list may not describe all possible side effects. Call your doctor for medical advice about side effects. You may report side effects to FDA at1-800-FDA-1088. Where should I keep my medication? Keep out of the reach of children. You will be instructed on how to store this medicine. Throw away any unusedmedicine after the expiration date on the label. NOTE: This sheet is a summary. It may not cover all possible information. If you have questions about this medicine, talk to your doctor, pharmacist, orhealth care provider.  2022 Elsevier/Gold Standard (2017-05-07 17:22:56)

## 2021-03-07 NOTE — Progress Notes (Signed)
[facility]    Provider:  Dr Jaynee Eagles Requesting Provider: Ria Bush, MD Primary Care Provider:  Ria Bush, MD  CC:  Migraines  HPI:  [name] is a 70 y.o. female here as requested by Ria Bush, MD for intractable migraines. PMHx OSA not on cpap, migraines  She has had headaches since her daughter was born in 9, in the 1990s she frequented the ED with shots, dealing with it for years, pulsating/pounding,severe, nausea, used to vomit a lot, light and sound sensitivity. She stopped the coffee at the advice of her pcp and she had daily headaches. She wakes with the headaches and all day long. When she drinks coffee she has a light headache every day she drinks a pot of coffee and her headaches has improved. Muscle relaxer helps. She often wakes up with a bad headache. She cannot use the cpap due to panic attacks, she has had sleep apnea for many years, she said she was too heavy to get inspire, she is considering a dental device and going through sleep testing now. Topiramate is not helping. Nauseated every day at leat 10 migraine days a month for the last year, no medication overuse, she gets migraines in the back of the right side of th head above her earradiates to the front, pulsating/pounding, photo/phono phobia, has to go in a dark quiet room, nausea and vomiting, can last up to days. Ongoing at this severity and frequency for years. Neck injections did not help the migraines.   Reviewed notes, labs and imaging from outside physicians, which showed:  From a thorough review of records, medications tried that can be used in migraine management includes Tylenol, tramadol, Excedrin, Topamax, aspirin, Fioricet, carvedilol, Flexeril, nortriptyline, lisinopril, magnesium, Reglan, metoprolol, naproxen, Imitrex, Zofran injections and tablets, oxycodone, prednisone, Phenergan, Nurtec, verapamil,  02/2021: BUN 19, creat 0.93 TSH 1/22: 1.37 MRI brain  01/28/2021:  FINDINGS: Brain: There is no acute infarction or intracranial hemorrhage. There is no intracranial mass, mass effect, or edema. There is no hydrocephalus or extra-axial fluid collection. Ventricles and sulci are within normal limits in size and configuration.   Vascular: Major vessel flow voids at the skull base are preserved.   Skull and upper cervical spine: Normal marrow signal is preserved.   Sinuses/Orbits: Minor mucosal thickening.  Orbits are unremarkable.   Other: Sella is unremarkable.  Mastoid air cells are clear.   IMPRESSION: personally reviewed and agree with findings No evidence of recent infarction, hemorrhage, mass, or other significant abnormality.  Review of Systems: Patient complains of symptoms per HPI as well as the following symptoms headache. Pertinent negatives and positives per HPI. All others negative.   Social History   Socioeconomic History   Marital status: Married    Spouse name: Not on file   Number of children: Not on file   Years of education: Not on file   Highest education level: Not on file  Occupational History   Occupation: disabled    Employer: OTHER  Tobacco Use   Smoking status: Former    Packs/day: 2.00    Years: 43.00    Pack years: 86.00    Types: Cigarettes    Quit date: 05/17/2010    Years since quitting: 10.8   Smokeless tobacco: Never   Tobacco comments:    QUIT 2011  Vaping Use   Vaping Use: Never used  Substance and Sexual Activity   Alcohol use: No    Alcohol/week: 0.0 standard drinks   Drug use:  No   Sexual activity: Yes    Partners: Male  Other Topics Concern   Not on file  Social History Narrative   Caffeine: 2-3 coffee/am, 1 cup tea in afternoon; Lives with husband, 1 dog, 2 cats; Occupation: disability since 2009 for bipolar, previously worked at [employer]; Diet: not many fruits, good vegetables, good amt water.      Smoker 43 years; quit in 2011. No alcohol. Lives in [place]; with husband.     Social Determinants of Health   Financial Resource Strain: Low Risk    Difficulty of Paying Living Expenses: Not hard at all  Food Insecurity: No Food Insecurity   Worried About Charity fundraiser in the Last Year: Never true   [place] in the Last Year: Never true  Transportation Needs: No Transportation Needs   Lack of Transportation (Medical): No   Lack of Transportation (Non-Medical): No  Physical Activity: Inactive   Days of Exercise per Week: 0 days   Minutes of Exercise per Session: 0 min  Stress: No Stress Concern Present   Feeling of Stress : Not at all  Social Connections: Not on file  Intimate Partner Violence: Not At Risk   Fear of Current or Ex-Partner: No   Emotionally Abused: No   Physically Abused: No   Sexually Abused: No    Family History  Problem Relation Age of Onset   Asthma Maternal Uncle    Cancer Maternal Uncle        colon   Aneurysm Maternal Uncle    Colon cancer Maternal Uncle 12   Hypertension Mother    Stroke Mother    Arthritis Mother    COPD Sister    Acute lymphoblastic leukemia Cousin 3       died at age 65   Cancer Father 57       brain tumor   Diabetes Father    Thyroid disease Father    Cancer Maternal Aunt 79       breast   Breast cancer Maternal Aunt    Cancer Paternal Uncle        prostate   Coronary artery disease Neg Hx    Stomach cancer Neg Hx    Rectal cancer Neg Hx    Esophageal cancer Neg Hx     Past Medical History:  Diagnosis Date   Anxiety    Arthritis    per patient "all over body"   Bipolar disorder ([place])    has stopped seeing psychiatrist   CAD (coronary artery disease)    a. 06/2020 MV: small, sev, apical inf/apical defect-scar vs ischemia. EF 45%; b. 06/2020 Cath: LM 102m, LAD 70ost, 18m, LCX nl, RCA nl; c. 07/2020 CABG x 1: LIMA->LAD.   Cataract    Chronic bronchitis    COPD (chronic obstructive pulmonary disease) (HCC)    Coronary artery disease, non-occlusive    Depression    Dyspnea  2012   s/p pulm/cards w/u WNL, thought due to obesity/deconditioning   Fibromyalgia    GERD (gastroesophageal reflux disease)    HFmrEF (heart failure with mid-range ejection fraction) ([place])    a. 05/2020 Echo: EF 40%, glob HK, mild LVH, Gr1 DD; b. 06/2020 EF by SPECT: 45%; c. 07/2020 intraop TEE: EF 50%.   History of chicken pox    HLD (hyperlipidemia)    Hypertension    Hypothyroidism    Ischemic cardiomyopathy    a. 05/2020 Echo: EF 40%; b. 06/2020 EF by SPECT: 45%;  c. 07/2020 intraop TEE: EF 50%.   Migraine    OSA (obstructive sleep apnea) 03/16/2011   Pneumonia    Sleep apnea    per patient cannot tolerate CPAP   T2DM (type 2 diabetes mellitus) ([place]) 2012   DM education 06/2011   Urinary incontinence     Patient Active Problem List   Diagnosis Date Noted   Unilateral occipital headache 01/10/2021   Abdominal aortic ectasia ([place]) 10/09/2020   Back pain 09/27/2020   Gross hematuria 09/14/2020   S/P CABG x 1 08/03/2020   Abnormal cardiovascular stress test    Aortic atherosclerosis ([place]) 04/15/2020   COPD (chronic obstructive pulmonary disease) ([place]) 04/15/2020   Pill dysphagia 04/05/2020   Cervical neck pain with evidence of disc disease 10/23/2019   Left hip pain 10/23/2019   Right knee pain 10/23/2019   Osteoarthritis 10/23/2019   Anemia 10/23/2019   Weight loss, unintentional 10/05/2019   Hepatic steatosis 05/03/2019   Thrombocytosis 04/24/2019   Abdominal pain 04/24/2019   Migraine 04/19/2018   Severe obesity (BMI 35.0-39.9) with comorbidity ([place]) 08/17/2017   Palpitations 01/08/2017   Mass of right side of neck 07/10/2016   Neutrophilia 04/30/2015   Advanced care planning/counseling discussion 01/22/2015   Health maintenance examination 01/22/2015   Medicare annual wellness visit, subsequent 12/15/2013   Tachycardia 09/13/2012   Osteoarthritis of left knee 12/11/2011   Candidal intertrigo 05/10/2011   Hypertension    Bipolar disorder ([place])    Fibromyalgia     Hyperlipidemia associated with type 2 diabetes mellitus ([place])    GERD (gastroesophageal reflux disease)    Hypothyroidism 04/05/2011   Hx of sleep apnea 03/16/2011   CAD (coronary artery disease) 03/16/2011   Diabetes mellitus type 2, controlled, with complications ([place]) 50/93/2671   Dyspnea 01/03/2011    Past Surgical History:  Procedure Laterality Date   BREAST CYST ASPIRATION Right 2003   cardiac catherization  03/2011   x3 [place]), mild nonobstructive CAD   COLONOSCOPY  12/2011   hyperplastic polyp x2,. diverticulosis, rec rpt 10 yrs   CORONARY ARTERY BYPASS GRAFT N/A 08/03/2020   Procedure: CORONARY ARTERY BYPASS GRAFTING TIMES ONE USING LEFT INTERNAL MAMMARY ARTERY.;  Surgeon: Gaye Pollack, MD;  Location: [facility] OR;  Service: Open Heart Surgery;  Laterality: N/A;   ESOPHAGOGASTRODUODENOSCOPY  2006   ESOPHAGOGASTRODUODENOSCOPY  05/2020   no esophageal abnormality to explain dysphagia - esophagus dilated. Erosive gastropathy - neg H pylori [place])   KNEE SURGERY  2006   left, torn meniscus   RIGHT/LEFT HEART CATH AND CORONARY ANGIOGRAPHY N/A 07/13/2020   Procedure: RIGHT/LEFT HEART CATH AND CORONARY ANGIOGRAPHY;  Surgeon: Nelva Bush, MD;  Location: [facility];  Service: Cardiovascular;  Laterality: N/A;   TEE WITHOUT CARDIOVERSION N/A 08/03/2020   Procedure: TRANSESOPHAGEAL ECHOCARDIOGRAM (TEE);  Surgeon: Gaye Pollack, MD;  Location: [facility];  Service: Open Heart Surgery;  Laterality: N/A;   TOTAL ABDOMINAL HYSTERECTOMY  2004   fibroids, cervix remained   TOTAL KNEE ARTHROPLASTY Left 08/2012   [name], [facility]    Current Outpatient Medications  Medication Sig Dispense Refill   albuterol (VENTOLIN HFA) 108 (90 Base) MCG/ACT inhaler Inhale 2 puffs into the lungs every 6 (six) hours as needed for wheezing or shortness of breath. 18 g 3   Apple Cider Vinegar 600 MG CAPS      aspirin 81 MG EC tablet Take 1 tablet (81 mg total) by mouth daily.      Boswellia-Glucosamine-Vit D (OSTEO BI-FLEX ONE  PER DAY PO) Take 1 tablet by mouth daily.     COVID-19 mRNA vaccine, Moderna, (MODERNA COVID-19 VACCINE) 100 MCG/0.5ML injection Inject into the muscle. 0.25 mL 0   cyclobenzaprine (FLEXERIL) 10 MG tablet Take 0.5-1 tablets (5-10 mg total) by mouth 2 (two) times daily as needed for muscle spasms (migraine). 30 tablet 0   famotidine (PEPCID) 20 MG tablet Take 20 mg by mouth every other day. Alternates with omeprazole     Fremanezumab-vfrm (AJOVY) 225 MG/1.5ML SOAJ Inject 225 mg into the skin every 30 (thirty) days. 4 mL 0   isosorbide mononitrate (IMDUR) 30 MG 24 hr tablet Take 1 tablet (30 mg total) by mouth daily. 90 tablet 3   levothyroxine (SYNTHROID) 100 MCG tablet TAKE 1 TABLET DAILY, AND ON2 DAYS A WEEK, TAKE AN     EXTRA 1/2 TABLET 90 tablet 3   loratadine (CLARITIN) 10 MG tablet Take 1 tablet (10 mg total) by mouth daily.     metFORMIN (GLUCOPHAGE) 500 MG tablet Take 1 tablet (500 mg total) by mouth 2 (two) times daily with a meal. 180 tablet 1   Multiple Vitamin (MULTIVITAMIN WITH MINERALS) TABS tablet Take 1 tablet by mouth daily. One A Day for Women     nystatin cream (MYCOSTATIN) APPLY TO AFFECTED AREA TWICE A DAY 30 g 1   omeprazole (PRILOSEC) 20 MG capsule TAKE 1 DAILY-ALTERNATES WITH FAMOTIDINE 90 capsule 3   ondansetron (ZOFRAN ODT) 4 MG disintegrating tablet Take 1 tablet by mouth every 4- 6 hours as needed 30 tablet 3   ONE TOUCH LANCETS MISC Use to check sugar twice daily Dx: E11.8 200 each 3   ONETOUCH ULTRA test strip USE TO CHECK BLOOD SUGAR 2 TIMES   DAILY 200 strip 3   OXYGEN Inhale 2 L into the lungs at bedtime.     pioglitazone (ACTOS) 15 MG tablet Take 1 tablet (15 mg total) by mouth daily. 90 tablet 1   Rhubarb (ESTROVEN COMPLETE PO) Take 1 tablet by mouth daily.     simvastatin (ZOCOR) 40 MG tablet TAKE 1 TABLET AT BEDTIME 90 tablet 3   topiramate (TOPAMAX) 100 MG tablet Take 0.5 tablets (50 mg total) by mouth daily AND 1  tablet (100 mg total) at bedtime. 135 tablet 1   torsemide (DEMADEX) 20 MG tablet Take 1 tablet (20 mg total) by mouth daily. 30 tablet 5   ziprasidone (GEODON) 40 MG capsule Take 1 capsule (40 mg total) by mouth in the morning and at bedtime. 180 capsule 1   No current facility-administered medications for this visit.    Allergies as of 03/07/2021 - Review Complete 03/07/2021  Allergen Reaction Noted   Crestor [rosuvastatin] Other (See Comments) 07/14/2012   Lipitor [atorvastatin] Other (See Comments) 07/14/2012   Lithium Other (See Comments) 01/03/2011   Quetiapine Other (See Comments) 04/18/2011    Vitals: BP 112/68 (BP Location: Right Arm, Patient Position: Sitting, Cuff Size: Large)   Pulse 78   Ht 5\' 2"  (1.575 m)   Wt 230 lb 9.6 oz (104.6 kg)   BMI 42.18 kg/m  Last Weight:  Wt Readings from Last 1 Encounters:  03/07/21 230 lb 9.6 oz (104.6 kg)   Last Height:   Ht Readings from Last 1 Encounters:  03/07/21 5\' 2"  (1.575 m)     Physical exam: Exam: Gen: NAD, conversant, well nourised, obese, well groomed  CV: RRR, no MRG. No Carotid Bruits. No peripheral edema, warm, nontender Eyes: Conjunctivae clear without exudates or hemorrhage  Neuro: Detailed Neurologic Exam  Speech:    Speech is normal; fluent and spontaneous with normal comprehension.  Cognition:    The patient is oriented to person, place, and time;     recent and remote memory intact;     language fluent;     normal attention, concentration,     fund of knowledge Cranial Nerves:    The pupils are equal, round, and reactive to light. Pupils too smll to visualize fundi. Visual fields are full to finger confrontation. Extraocular movements are intact. Trigeminal sensation is intact and the muscles of mastication are normal. The face is symmetric. The palate elevates in the midline. Hearing intact. Voice is normal. Shoulder shrug is normal. The tongue has normal motion without  fasciculations.   Coordination:    No dysmetria or ataxia  Gait:    Antalgic, in a wheel chair  Motor Observation:    No asymmetry, no atrophy, and no involuntary movements noted. Tone:    Normal muscle tone.    Posture:    Posture is norma in wheel chair    Strength:    Strength is symmetrical  in the upper and lower limbs, no focal weakness     Sensation: intact to LT     Reflex Exam:  DTR's:    Deep tendon reflexes in the upper and lower extremities are symmetrical  bilaterally.   Toes:    The toes are equiv  bilaterally.   Clonus:    Clonus is absent.    Assessment/Plan:  Patient with chronic migraines. Failed multiple medications  Untreated sleep apnea: Could be highly contributing to her headaches, encouraged her to continue working towards resolution with sleep doctor  Ajovy: Start as prevention. She may need patient assistance. I gave her samples, we will see if it helps and if so we can prescribe  the Ajovy at next appointment and help her with paperwork for assistance program, and can give her a few more samples in the meantime  Meds ordered this encounter  Medications   Fremanezumab-vfrm (AJOVY) 225 MG/1.5ML SOAJ    Sig: Inject 225 mg into the skin every 30 (thirty) days.    Dispense:  4 mL    Refill:  0    [id number] 12/23   Discussed: To prevent or relieve headaches, try the following: Cool Compress. Lie down and place a cool compress on your head.  Avoid headache triggers. If certain foods or odors seem to have triggered your migraines in the past, avoid them. A headache diary might help you identify triggers.  Include physical activity in your daily routine. Try a daily walk or other moderate aerobic exercise.  Manage stress. Find healthy ways to cope with the stressors, such as delegating tasks on your to-do list.  Practice relaxation techniques. Try deep breathing, yoga, massage and visualization.  Eat regularly. Eating regularly scheduled meals and  maintaining a healthy diet might help prevent headaches. Also, drink plenty of fluids.  Follow a regular sleep schedule. Sleep deprivation might contribute to headaches Consider biofeedback. With this mind-body technique, you learn to control certain bodily functions -- such as muscle tension, heart rate and blood pressure -- to prevent headaches or reduce headache pain.    Proceed to emergency room if you experience new or worsening symptoms or symptoms do not resolve, if you have new neurologic symptoms or if headache is  severe, or for any concerning symptom.   Provided education and documentation from American headache Society toolbox including articles on: chronic migraine medication overuse headache, chronic migraines, prevention of migraines, behavioral and other nonpharmacologic treatments for headache.   Cc: Ria Bush, MD,  Ria Bush, MD  Sarina Ill, MD  [facility] [address] [place], [place] [address]  Phone [phone] Fax [phone]

## 2021-03-07 NOTE — Telephone Encounter (Signed)
Last OV - 01/10/2021 Next OV - 03/29/2021 Last Filled - 01/24/2021

## 2021-03-08 ENCOUNTER — Encounter: Payer: Self-pay | Admitting: Family Medicine

## 2021-03-11 ENCOUNTER — Telehealth: Payer: Self-pay | Admitting: Pulmonary Disease

## 2021-03-11 ENCOUNTER — Ambulatory Visit: Payer: Medicare HMO | Admitting: Cardiology

## 2021-03-11 MED ORDER — TRELEGY ELLIPTA 100-62.5-25 MCG/INH IN AEPB: 1.0000 | INHALATION_SPRAY | Freq: Every day | RESPIRATORY_TRACT | 3 refills | Status: DC

## 2021-03-11 NOTE — Telephone Encounter (Signed)
Rx for Trelegy 100 has been sent to preferred pharmacy.  Patient is aware and voiced her understanding.  Nothing further needed.

## 2021-03-14 ENCOUNTER — Telehealth: Payer: Self-pay | Admitting: Pulmonary Disease

## 2021-03-14 MED ORDER — BREZTRI AEROSPHERE 160-9-4.8 MCG/ACT IN AERO: 2.0000 | INHALATION_SPRAY | Freq: Two times a day (BID) | RESPIRATORY_TRACT | 0 refills | Status: DC

## 2021-03-14 NOTE — Telephone Encounter (Signed)
Spoke to patient, who stated that trelegy is not affordable. [name] is more affordable.   Dr. Patsey Berthold, please advise if okay to switch?

## 2021-03-14 NOTE — Telephone Encounter (Signed)
[name] will be perfectly fine, 2 inhalations twice a day.

## 2021-03-14 NOTE — Telephone Encounter (Signed)
Rx for [name] has been sent to preferred pharmacy.  Patient is aware and voiced her understanding. Nothing further needed.

## 2021-03-14 NOTE — Telephone Encounter (Signed)
Pt stated that she spoke with her insurance company [employer], clinical (histocompatibility and immunogenetics)) and she stated that she is not able to afford Trelegy- 3 month supply stated that it was $326 and stated that she cant afford Anoro as well which she was taking before and would like to know if there is an alternative that is more cost effective.  Pls regard; [phone]

## 2021-03-14 NOTE — Telephone Encounter (Signed)
Spoke with patient, who stated that anoro nor Trelegy is affordable.  Patient is currently in the donut hole. Patient stated that she goes not qualify for patient assistance.  I have recommended that she contact insurance for a cheaper alternative.  Patient will call back with update.  Nothing further needed at this time.

## 2021-03-23 ENCOUNTER — Telehealth: Payer: Self-pay | Admitting: Family Medicine

## 2021-03-23 NOTE — Chronic Care Management (AMB) (Signed)
  Chronic Care Management   Note  03/23/2021 Name: [name] MRN: [id number] DOB: 1951/04/16  [name] is a 70 y.o. year old female who is a primary care patient of Ria Bush, MD. I reached out to Marina Goodell by phone today in response to a referral sent by [name]'s PCP, Ria Bush, MD.   [name] was given information about Chronic Care Management services today including:  CCM service includes personalized support from designated clinical staff supervised by her physician, including individualized plan of care and coordination with other care providers 24/7 contact phone numbers for assistance for urgent and routine care needs. Service will only be billed when office clinical staff spend 20 minutes or more in a month to coordinate care. Only one practitioner may furnish and bill the service in a calendar month. The patient may stop CCM services at any time (effective at the end of the month) by phone call to the office staff.   Patient agreed to services and verbal consent obtained.   Follow up plan:   [name]

## 2021-03-29 ENCOUNTER — Ambulatory Visit: Payer: Medicare HMO | Admitting: Family Medicine

## 2021-03-30 ENCOUNTER — Ambulatory Visit (INDEPENDENT_AMBULATORY_CARE_PROVIDER_SITE_OTHER): Payer: Medicare HMO | Admitting: Family Medicine

## 2021-03-30 ENCOUNTER — Telehealth: Payer: Self-pay | Admitting: Pulmonary Disease

## 2021-03-30 ENCOUNTER — Other Ambulatory Visit: Payer: Self-pay

## 2021-03-30 ENCOUNTER — Encounter: Payer: Self-pay | Admitting: Family Medicine

## 2021-03-30 VITALS — BP 134/62 | HR 100 | Temp 97.5°F | Ht 62.0 in | Wt 231.1 lb

## 2021-03-30 DIAGNOSIS — I2510 Atherosclerotic heart disease of native coronary artery without angina pectoris: Secondary | ICD-10-CM

## 2021-03-30 DIAGNOSIS — Z951 Presence of aortocoronary bypass graft: Secondary | ICD-10-CM

## 2021-03-30 DIAGNOSIS — G43911 Migraine, unspecified, intractable, with status migrainosus: Secondary | ICD-10-CM | POA: Diagnosis not present

## 2021-03-30 DIAGNOSIS — J449 Chronic obstructive pulmonary disease, unspecified: Secondary | ICD-10-CM | POA: Diagnosis not present

## 2021-03-30 DIAGNOSIS — Z794 Long term (current) use of insulin: Secondary | ICD-10-CM

## 2021-03-30 DIAGNOSIS — E118 Type 2 diabetes mellitus with unspecified complications: Secondary | ICD-10-CM

## 2021-03-30 DIAGNOSIS — G4733 Obstructive sleep apnea (adult) (pediatric): Secondary | ICD-10-CM | POA: Diagnosis not present

## 2021-03-30 LAB — POCT GLYCOSYLATED HEMOGLOBIN (HGB A1C): Hemoglobin A1C: 6 % — AB (ref 4.0–5.6)

## 2021-03-30 MED ORDER — GABAPENTIN 100 MG PO CAPS: 100.0000 mg | ORAL_CAPSULE | Freq: Every day | ORAL | 3 refills | Status: DC

## 2021-03-30 NOTE — Assessment & Plan Note (Addendum)
Chronic, stable on metformin and actos - continue current regimen. Foot exam today.  Notes paresthesias - will start gabapentin, will check b12 next labs.

## 2021-03-30 NOTE — Progress Notes (Signed)
Patient ID: GREETA RANDLE, female    DOB: November 24, 1950, 70 y.o.   MRN: [id number]  This visit was conducted in person.  BP 134/62   Pulse 100   Temp (!) 97.5 F (36.4 C) (Temporal)   Ht '5\' 2"'$  (1.575 m)   Wt 231 lb 1 oz (104.8 kg)   SpO2 96%   BMI 42.26 kg/m    CC: 3 mo DM f/u visit  Subjective:   HPI: Janene MARGARUITE WEIDLER is a 70 y.o. female presenting on 03/30/2021 for Diabetes (Here for 3 mo f/u.)   COPD followed by pulm - now on Breztri in place of trelegy due to cost (currently in donut hole).    OSA - saw pulm, suggested trial off topamax as it could cause insomnia. Rec repeat sleep study in lab. She continues night time oxygen. Ex smoker (45 yrs)  Chronic migraine headache - established with neurology Dr Jaynee Eagles - topamax stopped, now on Ajovy. Rec OSA eval. Failed multiple prior treatments (see neuro note). Does not qualify for patient assistance. Significant improvement since starting Ajovy. Using excedrin abortively.   Saw cardiology ([facility]) last month for worsening dyspnea with chest tightness - isosorbide dose increased to '30mg'$  daily, discussing repeat catheterization. She has f/u appt 9/1. s/p 1v CABG to [place] 07/2020. Myoview 10/2020  no significant ischemia.   DM - does regularly check sugars twice daily - fasting 89-150, post dinner 130-180. Compliant with antihyperglycemic regimen which includes: metformin '500mg'$  bid and actos '15mg'$  daily. Novolin N stopped 12/2020 due to low sugars. Denies low sugars or hypoglycemic symptoms. Notes new paresthesias with numbness and occasional burning pain, no blurry vision. Last diabetic eye exam 03/2020. Glucometer brand: one touch. Last foot exam: DUE. DSME: completed 2012.  Lab Results  Component Value Date   HGBA1C 6.0 (A) 03/30/2021   Diabetic Foot Exam - Simple   Simple Foot Form Diabetic Foot exam was performed with the following findings: Yes 03/30/2021 10:29 AM  Visual Inspection See comments: Yes Sensation Testing Intact  to touch and monofilament testing bilaterally: Yes Pulse Check See comments: Yes Comments Thickened nails Diminished pedal pulses    Lab Results  Component Value Date   MICROALBUR 2.4 (H) 12/27/2020        Relevant past medical, surgical, family and social history reviewed and updated as indicated. Interim medical history since our last visit reviewed. Allergies and medications reviewed and updated. Outpatient Medications Prior to Visit  Medication Sig Dispense Refill   albuterol (VENTOLIN HFA) 108 (90 Base) MCG/ACT inhaler Inhale 2 puffs into the lungs every 6 (six) hours as needed for wheezing or shortness of breath. 18 g 3   Apple Cider Vinegar 600 MG CAPS      aspirin 81 MG EC tablet Take 1 tablet (81 mg total) by mouth daily.     Boswellia-Glucosamine-Vit D (OSTEO BI-FLEX ONE PER DAY PO) Take 1 tablet by mouth daily.     Budeson-Glycopyrrol-Formoterol (BREZTRI AEROSPHERE) 160-9-4.8 MCG/ACT AERO Inhale 2 puffs into the lungs in the morning and at bedtime. 5.9 g 0   cyclobenzaprine (FLEXERIL) 10 MG tablet TAKE 0.5-1 TABLETS (5-10 MG TOTAL) BY MOUTH 2 TIMES DAILY AS NEEDED FOR MUSCLE SPASMS (MIGRAINE). 30 tablet 0   famotidine (PEPCID) 20 MG tablet Take 20 mg by mouth every other day. Alternates with omeprazole     Fremanezumab-vfrm (AJOVY) 225 MG/1.5ML SOAJ Inject 225 mg into the skin every 30 (thirty) days. 4 mL 0   isosorbide mononitrate (  IMDUR) 30 MG 24 hr tablet Take 1 tablet (30 mg total) by mouth daily. 90 tablet 3   levothyroxine (SYNTHROID) 100 MCG tablet TAKE 1 TABLET DAILY, AND ON2 DAYS A WEEK, TAKE AN     EXTRA 1/2 TABLET 90 tablet 3   loratadine (CLARITIN) 10 MG tablet Take 1 tablet (10 mg total) by mouth daily.     metFORMIN (GLUCOPHAGE) 500 MG tablet Take 1 tablet (500 mg total) by mouth 2 (two) times daily with a meal. 180 tablet 1   Multiple Vitamin (MULTIVITAMIN WITH MINERALS) TABS tablet Take 1 tablet by mouth daily. One A Day for Women     nystatin cream  (MYCOSTATIN) APPLY TO AFFECTED AREA TWICE A DAY 30 g 1   omeprazole (PRILOSEC) 20 MG capsule TAKE 1 DAILY-ALTERNATES WITH FAMOTIDINE 90 capsule 3   ondansetron (ZOFRAN ODT) 4 MG disintegrating tablet Take 1 tablet by mouth every 4- 6 hours as needed 30 tablet 3   ONE TOUCH LANCETS MISC Use to check sugar twice daily Dx: E11.8 200 each 3   ONETOUCH ULTRA test strip USE TO CHECK BLOOD SUGAR 2 TIMES   DAILY 200 strip 3   OXYGEN Inhale 2 L into the lungs at bedtime.     pioglitazone (ACTOS) 15 MG tablet Take 1 tablet (15 mg total) by mouth daily. 90 tablet 1   Rhubarb (ESTROVEN COMPLETE PO) Take 1 tablet by mouth daily.     simvastatin (ZOCOR) 40 MG tablet TAKE 1 TABLET AT BEDTIME 90 tablet 3   torsemide (DEMADEX) 20 MG tablet Take 1 tablet (20 mg total) by mouth daily. 30 tablet 5   ziprasidone (GEODON) 40 MG capsule Take 1 capsule (40 mg total) by mouth in the morning and at bedtime. 180 capsule 1   COVID-19 mRNA vaccine, Moderna, (MODERNA COVID-19 VACCINE) 100 MCG/0.5ML injection Inject into the muscle. 0.25 mL 0   topiramate (TOPAMAX) 100 MG tablet Take 0.5 tablets (50 mg total) by mouth daily AND 1 tablet (100 mg total) at bedtime. 135 tablet 1   No facility-administered medications prior to visit.     Per HPI unless specifically indicated in ROS section below Review of Systems  Objective:  BP 134/62   Pulse 100   Temp (!) 97.5 F (36.4 C) (Temporal)   Ht '5\' 2"'$  (1.575 m)   Wt 231 lb 1 oz (104.8 kg)   SpO2 96%   BMI 42.26 kg/m   Wt Readings from Last 3 Encounters:  03/30/21 231 lb 1 oz (104.8 kg)  03/07/21 230 lb 9.6 oz (104.6 kg)  02/25/21 226 lb (102.5 kg)      Physical Exam Vitals and nursing note reviewed.  Constitutional:      Appearance: Normal appearance. She is not ill-appearing.  Eyes:     Extraocular Movements: Extraocular movements intact.     Conjunctiva/sclera: Conjunctivae normal.     Pupils: Pupils are equal, round, and reactive to light.  Cardiovascular:      Rate and Rhythm: Normal rate and regular rhythm.     Pulses: Normal pulses.     Heart sounds: Normal heart sounds. No murmur heard. Pulmonary:     Effort: Pulmonary effort is normal. No respiratory distress.     Breath sounds: Normal breath sounds. No wheezing, rhonchi or rales.  Musculoskeletal:     Right lower leg: No edema.     Left lower leg: No edema.     Comments: See HPI for foot exam if done  Skin:  General: Skin is warm and dry.     Findings: No rash.  Neurological:     Mental Status: She is alert.  Psychiatric:        Mood and Affect: Mood normal.        Behavior: Behavior normal.      Results for orders placed or performed in visit on 03/30/21  POCT glycosylated hemoglobin (Hb A1C)  Result Value Ref Range   Hemoglobin A1C 6.0 (A) 4.0 - 5.6 %   HbA1c POC (<> result, manual entry)     HbA1c, POC (prediabetic range)     HbA1c, POC (controlled diabetic range)      Assessment & Plan:  This visit occurred during the SARS-CoV-2 public health emergency.  Safety protocols were in place, including screening questions prior to the visit, additional usage of staff PPE, and extensive cleaning of exam room while observing appropriate contact time as indicated for disinfecting solutions.   Problem List Items Addressed This Visit     Diabetes mellitus type 2, controlled, with complications ([place]) - Primary    Chronic, stable on metformin and actos - continue current regimen. Foot exam today.  Notes paresthesias - will start gabapentin, will check b12 next labs.       Relevant Orders   POCT glycosylated hemoglobin (Hb A1C) (Completed)   OSA (obstructive sleep apnea)    Known OSA, not on CPAP - has had trouble tolerating mask in the past, and did not meet BMI criteria for inspire.  Has f/u planned with pulm - pending split night sleep study in lab. Neuro thinks this contributes to migraines.        CAD (coronary artery disease)    Imdur recently increased. Considering rpt  catheterization        Obesity, morbid, BMI 40.0-49.9 ([place])    Weight gain noted.        Migraine    Appreciate neuro care.  Now on Ajovy with marked improvement in headaches however pt concerned with affordability towards end of year. States she doesn't think she qualifies for patient assistance due to yearly income.  Also taking excedrin abortively with good effect.        Relevant Medications   gabapentin (NEURONTIN) 100 MG capsule   COPD (chronic obstructive pulmonary disease) ([place])    Appreciate pulm care Patsey Berthold). Now on breztri 2 puffs BID.        S/P CABG x 1     Meds ordered this encounter  Medications   gabapentin (NEURONTIN) 100 MG capsule    Sig: Take 1-3 capsules (100-300 mg total) by mouth at bedtime.    Dispense:  60 capsule    Refill:  3   Orders Placed This Encounter  Procedures   POCT glycosylated hemoglobin (Hb A1C)    Patient Instructions  You are doing well today. Sugars staying well controlled! We can try gabapentin - take '100mg'$  nightly for several nights and if tolerating well and needed can slowly increase to '200mg'$  then '300mg'$  nightly. Update me with effect.  Return in 3 months for wellness visit/physical   Follow up plan: Return in about 3 months (around 06/30/2021) for annual exam, prior fasting for blood work, medicare wellness visit.  Ria Bush, MD

## 2021-03-30 NOTE — Assessment & Plan Note (Signed)
Weight gain noted.

## 2021-03-30 NOTE — Assessment & Plan Note (Addendum)
Appreciate neuro care.  Now on Ajovy with marked improvement in headaches however pt concerned with affordability towards end of year. States she doesn't think she qualifies for patient assistance due to yearly income.  Also taking excedrin abortively with good effect.

## 2021-03-30 NOTE — Patient Instructions (Addendum)
You are doing well today. Sugars staying well controlled! We can try gabapentin - take '100mg'$  nightly for several nights and if tolerating well and needed can slowly increase to '200mg'$  then '300mg'$  nightly. Update me with effect.  Return in 3 months for wellness visit/physical

## 2021-03-30 NOTE — Assessment & Plan Note (Addendum)
Imdur recently increased. Considering rpt catheterization

## 2021-03-30 NOTE — Assessment & Plan Note (Addendum)
Known OSA, not on CPAP - has had trouble tolerating mask in the past, and did not meet BMI criteria for inspire.  Has f/u planned with pulm - pending split night sleep study in lab. Neuro thinks this contributes to migraines.

## 2021-03-30 NOTE — Assessment & Plan Note (Signed)
Appreciate pulm care [name]). Now on breztri 2 puffs BID.

## 2021-03-30 NOTE — Telephone Encounter (Signed)
Called and spoke with patient who I calling to get her sleep study scheduled. She said she wants to do the one at home and not at the sleep lab.   Dr. Patsey Berthold are you ok with [place] ordering a HST?

## 2021-04-01 NOTE — Telephone Encounter (Signed)
With her obesity and underlying cardiac issues Home sleep study is not indicated.  She will likely also need CPAP/BiPAP titration at the time of the test.  I suspect she has complex sleep apnea.  She should be done in the lab.

## 2021-04-04 ENCOUNTER — Ambulatory Visit: Payer: Medicare HMO | Admitting: Neurology

## 2021-04-05 NOTE — Telephone Encounter (Signed)
Called and spoke to pt. Informed her of the recs per Dr. Patsey Berthold. Pt verbalized understanding.   Will forward back to [place] to schedule study.

## 2021-04-05 NOTE — Telephone Encounter (Signed)
I have spoke with Stacie over at [facility] and she will reach back out to the patient to get her scheduled

## 2021-04-06 DIAGNOSIS — J449 Chronic obstructive pulmonary disease, unspecified: Secondary | ICD-10-CM | POA: Diagnosis not present

## 2021-04-08 NOTE — Telephone Encounter (Signed)
Spoke to patient, who stated that [facility] has not contacted her to schedule sleep study.   Rodena Piety, please advise. Thanks

## 2021-04-11 ENCOUNTER — Other Ambulatory Visit: Payer: Self-pay | Admitting: Family Medicine

## 2021-04-11 NOTE — Telephone Encounter (Signed)
I called [facility] and left Stacie a message that this is the patient that I spoke to her about last week and would she reach out to the patient

## 2021-04-13 NOTE — Telephone Encounter (Signed)
Stacie at [facility] was under the impression that the patient wanted to do a home sleep study not an in lab study. I read her Dr. Domingo Dimes note below stating the patient needed to do in lab sleep study. Stacie stated she would call her back and ger her rescheduled

## 2021-04-14 ENCOUNTER — Other Ambulatory Visit: Payer: Self-pay | Admitting: Pulmonary Disease

## 2021-04-21 ENCOUNTER — Other Ambulatory Visit: Payer: Self-pay

## 2021-04-21 ENCOUNTER — Encounter: Payer: Self-pay | Admitting: Cardiology

## 2021-04-21 ENCOUNTER — Ambulatory Visit: Payer: Medicare HMO | Admitting: Cardiology

## 2021-04-21 VITALS — BP 122/60 | HR 91 | Ht 62.0 in | Wt 236.0 lb

## 2021-04-21 DIAGNOSIS — E785 Hyperlipidemia, unspecified: Secondary | ICD-10-CM

## 2021-04-21 DIAGNOSIS — I2510 Atherosclerotic heart disease of native coronary artery without angina pectoris: Secondary | ICD-10-CM

## 2021-04-21 DIAGNOSIS — R0602 Shortness of breath: Secondary | ICD-10-CM | POA: Diagnosis not present

## 2021-04-21 NOTE — Progress Notes (Signed)
Cardiology Office Note:    Date:  04/21/2021   ID:  [name], DOB 12-May-1951, MRN [id number]  PCP:  Ria Bush, MD  [facility] Cardiologist:  Kate Sable, MD  Industry Electrophysiologist:  None   Referring MD: Ria Bush, MD   Chief Complaint  Patient presents with   Other    2 month follow up -- Patient c.o SOB all the time with little activity. She states that her Chest pain has gotten better with increasing the Imdur. Meds reviewed verbally with patient.     History of Present Illness:    [name] is a 70 y.o. female with a hx of CAD/CABG x 1 (LIMA-LAD, 07/2020), hypertension, hyperlipidemia, diabetes, former smoker x40 years, COPD, OSA who presents for follow-up.  Being seen for CAD, had symptoms of chest pain after last visit, Imdur was increased to 30 mg daily.  She states her symptoms of chest discomfort have significantly improved.  Blood pressure is also well controlled.  Still has shortness of breath with minimal exertion, severe right knee pain, planning on getting knee injections to help with pain.  Waiting on follow-up from pulmonary medicine/sleep specialist for management of OSA.   Prior notes Intraoperative TEE, EF approximately 50%. Right and left heart cath 06/2020 single-vessel CAD sequential 70% ostial, 90% mid LAD. CAD/CABG x1 LIMA to LAD 07/2020 Lexiscan Myoview 11/09/2020 with no significant ischemia, fixed apical defect.   Past Medical History:  Diagnosis Date   Anxiety    Arthritis    per patient "all over body"   Bipolar disorder [facility])    has stopped seeing psychiatrist   CAD (coronary artery disease)    a. 06/2020 MV: small, sev, apical inf/apical defect-scar vs ischemia. EF 45%; b. 06/2020 Cath: LM 28m LAD 70ost, 938mLCX nl, RCA nl; c. 07/2020 CABG x 1: LIMA->LAD.   Cataract    Chronic bronchitis    COPD (chronic obstructive pulmonary disease) (HCC)    Coronary artery disease, non-occlusive     Depression    Dyspnea 2012   s/p pulm/cards w/u WNL, thought due to obesity/deconditioning   Fibromyalgia    GERD (gastroesophageal reflux disease)    HFmrEF (heart failure with mid-range ejection fraction) ([place]   a. 05/2020 Echo: EF 40%, glob HK, mild LVH, Gr1 DD; b. 06/2020 EF by SPECT: 45%; c. 07/2020 intraop TEE: EF 50%.   History of chicken pox    HLD (hyperlipidemia)    Hypertension    Hypothyroidism    Ischemic cardiomyopathy    a. 05/2020 Echo: EF 40%; b. 06/2020 EF by SPECT: 45%; c. 07/2020 intraop TEE: EF 50%.   Migraine    OSA (obstructive sleep apnea) 03/16/2011   Pneumonia    Sleep apnea    per patient cannot tolerate CPAP   T2DM (type 2 diabetes mellitus) ([place]   DM education 06/2011   Urinary incontinence     Past Surgical History:  Procedure Laterality Date   BREAST CYST ASPIRATION Right 2003   cardiac catherization  03/2011   x3 ([place] mild nonobstructive CAD   COLONOSCOPY  12/2011   hyperplastic polyp x2,. diverticulosis, rec rpt 10 yrs   CORONARY ARTERY BYPASS GRAFT N/A 08/03/2020   Procedure: CORONARY ARTERY BYPASS GRAFTING TIMES ONE USING LEFT INTERNAL MAMMARY ARTERY.;  Surgeon: BaGaye PollackMD;  Location: [facility];  Service: Open Heart Surgery;  Laterality: N/A;   ESOPHAGOGASTRODUODENOSCOPY  2006   ESOPHAGOGASTRODUODENOSCOPY  05/2020   no esophageal abnormality  to explain dysphagia - esophagus dilated. Erosive gastropathy - neg H pylori [facility])   KNEE SURGERY  2006   left, torn meniscus   RIGHT/LEFT HEART CATH AND CORONARY ANGIOGRAPHY N/A 07/13/2020   Procedure: RIGHT/LEFT HEART CATH AND CORONARY ANGIOGRAPHY;  Surgeon: Nelva Bush, MD;  Location: [place] CV LAB;  Service: Cardiovascular;  Laterality: N/A;   TEE WITHOUT CARDIOVERSION N/A 08/03/2020   Procedure: TRANSESOPHAGEAL ECHOCARDIOGRAM (TEE);  Surgeon: Gaye Pollack, MD;  Location: [facility];  Service: Open Heart Surgery;  Laterality: N/A;   TOTAL ABDOMINAL HYSTERECTOMY  2004    fibroids, cervix remained   TOTAL KNEE ARTHROPLASTY Left 08/2012   [name], [place] [facility]    Current Medications: Current Meds  Medication Sig   albuterol (VENTOLIN HFA) 108 (90 Base) MCG/ACT inhaler Inhale 2 puffs into the lungs every 6 (six) hours as needed for wheezing or shortness of breath.   Apple Cider Vinegar 600 MG CAPS    aspirin 81 MG EC tablet Take 1 tablet (81 mg total) by mouth daily.   Boswellia-Glucosamine-Vit D (OSTEO BI-FLEX ONE PER DAY PO) Take 1 tablet by mouth daily.   cyclobenzaprine (FLEXERIL) 10 MG tablet TAKE 0.5-1 TABLETS (5-10 MG TOTAL) BY MOUTH 2 TIMES DAILY AS NEEDED FOR MUSCLE SPASMS (MIGRAINE).   famotidine (PEPCID) 20 MG tablet Take 20 mg by mouth every other day. Alternates with omeprazole   Fremanezumab-vfrm (AJOVY) 225 MG/1.5ML SOAJ Inject 225 mg into the skin every 30 (thirty) days.   gabapentin (NEURONTIN) 100 MG capsule Take 1-3 capsules (100-300 mg total) by mouth at bedtime.   isosorbide mononitrate (IMDUR) 30 MG 24 hr tablet Take 1 tablet (30 mg total) by mouth daily.   levothyroxine (SYNTHROID) 100 MCG tablet TAKE 1 TABLET DAILY, AND ON2 DAYS A WEEK, TAKE AN     EXTRA 1/2 TABLET   loratadine (CLARITIN) 10 MG tablet Take 1 tablet (10 mg total) by mouth daily.   metFORMIN (GLUCOPHAGE) 500 MG tablet Take 1 tablet (500 mg total) by mouth 2 (two) times daily with a meal.   Multiple Vitamin (MULTIVITAMIN WITH MINERALS) TABS tablet Take 1 tablet by mouth daily. One A Day for Women   nystatin cream (MYCOSTATIN) APPLY TO AFFECTED AREA TWICE A DAY   omeprazole (PRILOSEC) 20 MG capsule TAKE 1 DAILY-ALTERNATES WITH FAMOTIDINE   ondansetron (ZOFRAN ODT) 4 MG disintegrating tablet Take 1 tablet by mouth every 4- 6 hours as needed   ONE TOUCH LANCETS MISC Use to check sugar twice daily Dx: E11.8   ONETOUCH ULTRA test strip USE TO CHECK BLOOD SUGAR 2 TIMES   DAILY   OXYGEN Inhale 2 L into the lungs at bedtime.   pioglitazone (ACTOS) 15 MG tablet Take 1 tablet (15  mg total) by mouth daily.   Rhubarb (ESTROVEN COMPLETE PO) Take 1 tablet by mouth daily.   simvastatin (ZOCOR) 40 MG tablet TAKE 1 TABLET AT BEDTIME   torsemide (DEMADEX) 20 MG tablet Take 1 tablet (20 mg total) by mouth daily.   ziprasidone (GEODON) 40 MG capsule Take 1 capsule (40 mg total) by mouth in the morning and at bedtime.     Allergies:   Crestor [rosuvastatin], Lipitor [atorvastatin], Lithium, and Quetiapine   Social History   Socioeconomic History   Marital status: Married    Spouse name: Not on file   Number of children: Not on file   Years of education: Not on file   Highest education level: Not on file  Occupational History  Occupation: disabled    Fish farm manager: OTHER  Tobacco Use   Smoking status: Former    Packs/day: 2.00    Years: 43.00    Pack years: 86.00    Types: Cigarettes    Quit date: 05/17/2010    Years since quitting: 10.9   Smokeless tobacco: Never   Tobacco comments:    QUIT 2011  Vaping Use   Vaping Use: Never used  Substance and Sexual Activity   Alcohol use: No    Alcohol/week: 0.0 standard drinks   Drug use: No   Sexual activity: Yes    Partners: Male  Other Topics Concern   Not on file  Social History Narrative   Caffeine: 2-3 coffee/am, 1 cup tea in afternoon; Lives with husband, 1 dog, 2 cats; Occupation: disability since 2009 for bipolar, previously worked at [employer]; Diet: not many fruits, good vegetables, good amt water.      Smoker 43 years; quit in 2011. No alcohol. Lives in [place]; with husband.    Social Determinants of Health   Financial Resource Strain: Low Risk    Difficulty of Paying Living Expenses: Not hard at all  Food Insecurity: No Food Insecurity   Worried About Charity fundraiser in the Last Year: Never true   [place] in the Last Year: Never true  Transportation Needs: No Transportation Needs   Lack of Transportation (Medical): No   Lack of Transportation (Non-Medical): No  Physical Activity: Inactive    Days of Exercise per Week: 0 days   Minutes of Exercise per Session: 0 min  Stress: No Stress Concern Present   Feeling of Stress : Not at all  Social Connections: Not on file     Family History: The patient's family history includes Acute lymphoblastic leukemia (age of onset: 38) in her cousin; Aneurysm in her maternal uncle; Arthritis in her mother; Asthma in her maternal uncle; Breast cancer in her maternal aunt; COPD in her sister; Cancer in her maternal uncle and paternal uncle; Cancer (age of onset: 92) in her father; Cancer (age of onset: 14) in her maternal aunt; Colon cancer (age of onset: 48) in her maternal uncle; Diabetes in her father; Hypertension in her mother; Stroke in her mother; Thyroid disease in her father. There is no history of Coronary artery disease, Stomach cancer, Rectal cancer, or Esophageal cancer.  ROS:   Please see the history of present illness.     All other systems reviewed and are negative.  EKGs/Labs/Other Studies Reviewed:    The following studies were reviewed today:   EKG:  EKG not ordered today.   Recent Labs: 08/04/2020: Magnesium 1.8 09/14/2020: ALT 9; TSH 1.37 02/25/2021: Hemoglobin 10.7; Platelets 427 03/04/2021: BUN 19; Creatinine, Ser 0.93; Potassium 4.0; Sodium 137  Recent Lipid Panel    Component Value Date/Time   CHOL 131 05/21/2020 0915   TRIG 239.0 (H) 05/21/2020 0915   HDL 42.80 05/21/2020 0915   CHOLHDL 3 05/21/2020 0915   VLDL 47.8 (H) 05/21/2020 0915   LDLCALC 48 12/08/2013 0824   LDLDIRECT 65.0 05/21/2020 0915     Risk Assessment/Calculations:      Physical Exam:    VS:  BP 122/60 (BP Location: Right Arm, Patient Position: Sitting, Cuff Size: Large)   Pulse 91   Ht '5\' 2"'$  (1.575 m)   Wt 236 lb (107 kg)   SpO2 95%   BMI 43.16 kg/m     Wt Readings from Last 3 Encounters:  04/21/21 236 lb (  107 kg)  03/30/21 231 lb 1 oz (104.8 kg)  03/07/21 230 lb 9.6 oz (104.6 kg)     GEN:  Well nourished, well developed in  no acute distress HEENT: Normal NECK: No JVD; No carotid bruits CARDIAC: RRR, no murmurs, rubs, gallops RESPIRATORY:  Clear to auscultation, decreased breath sounds at bases ABDOMEN: Soft, non-tender, distended MUSCULOSKELETAL:  No edema; left-sided chest tenderness noted. SKIN: Warm and dry NEUROLOGIC:  Alert and oriented x 3 PSYCHIATRIC:  Normal affect   ASSESSMENT:    1. Coronary artery disease involving native coronary artery of native heart, unspecified whether angina present   2. Hyperlipidemia LDL goal <70   3. Obesity, morbid, BMI 40.0-49.9 ([place])   4. SOB (shortness of breath)      PLAN:    In order of problems listed above:  CAD/CABG x1 on 07/2020.  Last EF on intraprocedural TEE 07/2020, EF 50%.  Chest pain improved with Imdur.  Continue Imdur 30 mg daily.  Continue aspirin, statin.  hyperlipidemia.  LDL at goal, continue simvastatin. morbidly obese, weight loss, low-calorie diet again advised Shortness of breath with minimal exertion, likely multifactorial, secondary to OSA, obesity, COPD.  In attempt to help with shortness of breath.  Follow-up in 6 weeks.   Medication Adjustments/Labs and Tests Ordered: Current medicines are reviewed at length with the patient today.  Concerns regarding medicines are outlined above.  No orders of the defined types were placed in this encounter.   No orders of the defined types were placed in this encounter.    Patient Instructions  Medication Instructions:  Your physician recommends that you continue on your current medications as directed. Please refer to the Current Medication list given to you today.  *If you need a refill on your cardiac medications before your next appointment, please call your pharmacy*   Lab Work: None ordered If you have labs (blood work) drawn today and your tests are completely normal, you will receive your results only by: [place] (if you have MyChart) OR A paper copy in the mail If  you have any lab test that is abnormal or we need to change your treatment, we will call you to review the results.   Testing/Procedures: None ordered   Follow-Up: At [facility], you and your health needs are our priority.  As part of our continuing mission to provide you with exceptional heart care, we have created designated Provider Care Teams.  These Care Teams include your primary Cardiologist (physician) and Advanced Practice Providers (APPs -  Physician Assistants and Nurse Practitioners) who all work together to provide you with the care you need, when you need it.  We recommend signing up for the patient portal called "MyChart".  Sign up information is provided on this After Visit Summary.  MyChart is used to connect with patients for Virtual Visits (Telemedicine).  Patients are able to view lab/test results, encounter notes, upcoming appointments, etc.  Non-urgent messages can be sent to your provider as well.   To learn more about what you can do with MyChart, go to [email or web address].    Your next appointment:   6 month(s)  The format for your next appointment:   In Person  Provider:   Kate Sable, MD   Other Instructions    Signed, Kate Sable, MD  04/21/2021 12:10 PM    [place]

## 2021-04-21 NOTE — Patient Instructions (Signed)

## 2021-04-22 DIAGNOSIS — M1711 Unilateral primary osteoarthritis, right knee: Secondary | ICD-10-CM | POA: Diagnosis not present

## 2021-04-23 ENCOUNTER — Other Ambulatory Visit: Payer: Self-pay | Admitting: Family Medicine

## 2021-04-26 NOTE — Telephone Encounter (Signed)
Flexeril Last filled:  03/09/21, #30 Last OV:  03/30/21, 3 mo DM f/u Next OV:  07/13/21, AWV prt 2

## 2021-05-05 ENCOUNTER — Telehealth: Payer: Self-pay | Admitting: Pharmacist

## 2021-05-05 NOTE — Progress Notes (Signed)
Chronic Care Management Pharmacy Assistant   Name: [name] MRN: [id number] DOB: 07-03-1951  Reason for Encounter: Initial Questions Appointment: Televisit 05/11/21 @ 11 am   Recent office visits:  03/30/21 Danise Mina (PCP) - Controlled type 2 diabetes mellitus with complication. Start Gabapentin. D/c Topiramate.  01/10/21 Danise Mina (PCP) - Intractable migraine with status migrainosus. No med changes.  12/27/20 Danise Mina (PCP) - Controlled type 2 diabetes mellitus with complication. Decrease Insulin to 10 units.   Recent consult visits:  04/21/21 Agbor-Etang (Cardiology) - Coronary artery disease involving native coronary artery of native heart. No med changes.  03/07/21 Ahern (Neurology) - Chronic migraine without aura, with intractable migraine. Start Ajovy 225 mg.  7/03/28/21 Agbor-Etang (Cardiology) - Coronary artery disease involving native coronary artery of native heart. Start Torsemide. Increase Isosorbide to 30 mg.  02/24/21 Patsey Berthold (Pulmonary) - Stage 1 mild COPD by GOLD classification. Start Trellgy. D/c Novolin & Anoro Ellipta.  02/16/21 Volanda Napoleon (Pulmonary) - OSA (obstructive sleep apnea). Night study done. No med changes.  01/14/21 Krasinski (Orthopedic) - Osteoarthritis of knee, unspecified. Triamcinolone injection.  01/05/21 Henrene Pastor (Physical Therapy) - Osteoarthritis of right knee joint.  12/17/20 Krasinski (Orthopedic) - Osteoarthritis of knee, unspecified.  12/07/20 Henrene Pastor (Physical Therapy) - Unilateral primary osteoarthritis, right knee. Visits every 2 wks.  12/06/20 Agbor-Etang (Cardiology) - Coronary artery disease involving native coronary artery of native heart without angina pectoris. EKG 12-lead. No med changes.  11/04/20 [place] Gertie Fey) - Diarrhea, unspecified. Start Ondansetron 4 mg.   Hospital visits: OP Visit 03/04/21 Agbor-Etang. Coronary artery disease involving native coronary artery of native heart, unspecified whether angina present. [place].  Medications: Outpatient Encounter Medications as of 05/05/2021  Medication Sig   albuterol (VENTOLIN HFA) 108 (90 Base) MCG/ACT inhaler Inhale 2 puffs into the lungs every 6 (six) hours as needed for wheezing or shortness of breath.   Apple Cider Vinegar 600 MG CAPS    aspirin 81 MG EC tablet Take 1 tablet (81 mg total) by mouth daily.   Boswellia-Glucosamine-Vit D (OSTEO BI-FLEX ONE PER DAY PO) Take 1 tablet by mouth daily.   BREZTRI AEROSPHERE 160-9-4.8 MCG/ACT AERO INHALE 2 PUFFS INTO THE LUNGS IN THE MORNING AND AT BEDTIME. (Patient not taking: Reported on 04/21/2021)   cyclobenzaprine (FLEXERIL) 10 MG tablet TAKE 1/2 TO 1 TABLET BY MOUTH TWICE A DAY AS NEEDED FOR MUSCLE SPASMS AND MIGRAINE   famotidine (PEPCID) 20 MG tablet Take 20 mg by mouth every other day. Alternates with omeprazole   Fremanezumab-vfrm (AJOVY) 225 MG/1.5ML SOAJ Inject 225 mg into the skin every 30 (thirty) days.   gabapentin (NEURONTIN) 100 MG capsule Take 1-3 capsules (100-300 mg total) by mouth at bedtime.   isosorbide mononitrate (IMDUR) 30 MG 24 hr tablet Take 1 tablet (30 mg total) by mouth daily.   levothyroxine (SYNTHROID) 100 MCG tablet TAKE 1 TABLET DAILY, AND ON2 DAYS A WEEK, TAKE AN     EXTRA 1/2 TABLET   loratadine (CLARITIN) 10 MG tablet Take 1 tablet (10 mg total) by mouth daily.   metFORMIN (GLUCOPHAGE) 500 MG tablet Take 1 tablet (500 mg total) by mouth 2 (two) times daily with a meal.   Multiple Vitamin (MULTIVITAMIN WITH MINERALS) TABS tablet Take 1 tablet by mouth daily. One A Day for Women   nystatin cream (MYCOSTATIN) APPLY TO AFFECTED AREA TWICE A DAY   omeprazole (PRILOSEC) 20 MG capsule TAKE 1 DAILY-ALTERNATES WITH FAMOTIDINE   ondansetron (ZOFRAN ODT) 4 MG disintegrating tablet Take 1  tablet by mouth every 4- 6 hours as needed   ONE TOUCH LANCETS MISC Use to check sugar twice daily Dx: E11.8   ONETOUCH ULTRA test strip USE TO CHECK BLOOD SUGAR 2 TIMES   DAILY   OXYGEN Inhale 2 L into the  lungs at bedtime.   pioglitazone (ACTOS) 15 MG tablet Take 1 tablet (15 mg total) by mouth daily.   Rhubarb (ESTROVEN COMPLETE PO) Take 1 tablet by mouth daily.   simvastatin (ZOCOR) 40 MG tablet TAKE 1 TABLET AT BEDTIME   torsemide (DEMADEX) 20 MG tablet Take 1 tablet (20 mg total) by mouth daily.   ziprasidone (GEODON) 40 MG capsule Take 1 capsule (40 mg total) by mouth in the morning and at bedtime.   No facility-administered encounter medications on file as of 05/05/2021.    Have you seen any other providers since your last visit?  Patient states she has been seeing her ortho provider for her knee.  Any changes in your medications or health?  Patient states no recent changes in meds or health.  Any side effects from any medications?  Patient states no side effects form medicine.  Do you have an symptoms or problems not managed by your medications?  Patient states she will start gel injections for her knee on 05/06/21 for the next 3 wks.  Any concerns about your health right now?  Patient states she has been experiencing SOB for a few years now and don't know why?  Has your provider asked that you check blood pressure, blood sugar, or follow special diet at home?  Patient states she checks her BP once a day and her sugar 2x day. She does not follow a special diet.  Do you get any type of exercise on a regular basis?  Patient states she tries to do some step exercises when her knee allows it.  Can you think of a goal you would like to reach for your health?  Patient states she would like to walk without getting SOB.  Do you have any problems getting your medications?  Patient states no problems getting medications.  Is there anything that you would like to discuss during the appointment?  Patient states no concerns at this time.  Please bring medications and supplements to appointment.  Star Rating Drugs: Metformin - filled 01/20/21 90D Simvastatin - filled 03/21/21  90D Patient states she uses mail order for her refills.  Orinda Kenner, RMA Clinical Pharmacists Assistant [phone]  Time Spent: 100

## 2021-05-06 DIAGNOSIS — M1711 Unilateral primary osteoarthritis, right knee: Secondary | ICD-10-CM | POA: Diagnosis not present

## 2021-05-07 DIAGNOSIS — J449 Chronic obstructive pulmonary disease, unspecified: Secondary | ICD-10-CM | POA: Diagnosis not present

## 2021-05-10 ENCOUNTER — Other Ambulatory Visit: Payer: Self-pay

## 2021-05-10 ENCOUNTER — Ambulatory Visit
Admission: RE | Admit: 2021-05-10 | Discharge: 2021-05-10 | Disposition: A | Discharge status: Home / Self Care | Payer: Medicare HMO | Source: Ambulatory Visit | Attending: Family Medicine | Admitting: Family Medicine

## 2021-05-10 DIAGNOSIS — E2839 Other primary ovarian failure: Secondary | ICD-10-CM

## 2021-05-10 DIAGNOSIS — Z780 Asymptomatic menopausal state: Secondary | ICD-10-CM | POA: Diagnosis not present

## 2021-05-11 ENCOUNTER — Other Ambulatory Visit: Payer: Self-pay | Admitting: Pharmacist

## 2021-05-11 ENCOUNTER — Ambulatory Visit (INDEPENDENT_AMBULATORY_CARE_PROVIDER_SITE_OTHER): Payer: Medicare HMO | Admitting: Pharmacist

## 2021-05-11 ENCOUNTER — Other Ambulatory Visit: Payer: Self-pay

## 2021-05-11 DIAGNOSIS — Z794 Long term (current) use of insulin: Secondary | ICD-10-CM

## 2021-05-11 DIAGNOSIS — E785 Hyperlipidemia, unspecified: Secondary | ICD-10-CM | POA: Diagnosis not present

## 2021-05-11 DIAGNOSIS — F3178 Bipolar disorder, in full remission, most recent episode mixed: Secondary | ICD-10-CM

## 2021-05-11 DIAGNOSIS — E118 Type 2 diabetes mellitus with unspecified complications: Secondary | ICD-10-CM | POA: Diagnosis not present

## 2021-05-11 DIAGNOSIS — I2510 Atherosclerotic heart disease of native coronary artery without angina pectoris: Secondary | ICD-10-CM | POA: Diagnosis not present

## 2021-05-11 DIAGNOSIS — R69 Illness, unspecified: Secondary | ICD-10-CM | POA: Diagnosis not present

## 2021-05-11 DIAGNOSIS — E1169 Type 2 diabetes mellitus with other specified complication: Secondary | ICD-10-CM

## 2021-05-11 DIAGNOSIS — J449 Chronic obstructive pulmonary disease, unspecified: Secondary | ICD-10-CM

## 2021-05-11 DIAGNOSIS — I10 Essential (primary) hypertension: Secondary | ICD-10-CM | POA: Diagnosis not present

## 2021-05-11 DIAGNOSIS — G43911 Migraine, unspecified, intractable, with status migrainosus: Secondary | ICD-10-CM

## 2021-05-11 DIAGNOSIS — K219 Gastro-esophageal reflux disease without esophagitis: Secondary | ICD-10-CM

## 2021-05-11 MED ORDER — TORSEMIDE 20 MG PO TABS: 20.0000 mg | ORAL_TABLET | Freq: Every day | ORAL | 3 refills | Status: DC

## 2021-05-11 NOTE — Progress Notes (Signed)
Chronic Care Management Pharmacy Note  05/11/2021 Name:  [name] MRN:  [id number] DOB:  1951/03/25  Summary: -Pt reports home BG: fasting 100-130, bedtime 150-170. Last A1c 6.0% 03/2021. -Pt reports leg swelling, SOB was mildly improved with torsemide. Pioglitazone may also be contributing to swelling and carries risk for developing heart failure. Pt is a good candidate for either Ozempic or Farxiga for additional CV and weight loss benefits, if additional therapy is needed. -Pt is not taking Breztri right now due to high cost in donut hole -Pt is taking naproxen 220 mg - 2 tablets BID for musculoskeletal chest pain; counseled on risks of chronic NSAID use -Pt is still having 3-4 headaches a week on Ajovy (reduced from daily headaches). She also reports she cannot afford Ajovy in donut hole.  Recommendations/Changes made from today's visit: -Recommend to discontinue pioglitazone, monitor blood sugar on metformin alone (will discuss with PCP). May consider Ozempic or Farxiga in future if needed (pt assistance available) -Enrolled patient in [place] pt assistance for [facility] - requires Rx from pulmonary to [facility] pharmacy -Recommended to reduce naproxen to 1 tablet daily, up to 2 daily if needed -Advised pt to contact neurology regarding Ajovy efficacy/pt assistance    Subjective: [name] is an 70 y.o. year old female who is a primary patient of Ria Bush, MD.  The CCM team was consulted for assistance with disease management and care coordination needs.    Engaged with patient by telephone for initial visit in response to provider referral for pharmacy case management and/or care coordination services.   Consent to Services:  The patient was given the following information about Chronic Care Management services today, agreed to services, and gave verbal consent: 1. CCM service includes personalized support from designated clinical staff supervised by the primary care  provider, including individualized plan of care and coordination with other care providers 2. 24/7 contact phone numbers for assistance for urgent and routine care needs. 3. Service will only be billed when office clinical staff spend 20 minutes or more in a month to coordinate care. 4. Only one practitioner may furnish and bill the service in a calendar month. 5.The patient may stop CCM services at any time (effective at the end of the month) by phone call to the office staff. 6. The patient will be responsible for cost sharing (co-pay) of up to 20% of the service fee (after annual deductible is met). Patient agreed to services and consent obtained.  Patient Care Team: Ria Bush, MD as PCP - General (Family Medicine) Kate Sable, MD as PCP - Cardiology (Cardiology) Bryson Ha, OD as Consulting Physician (Optometry) Charlton Haws, [facility] as Pharmacist (Pharmacist)  Patient is from [place], she has been in [facility] for over 20 years. Her daughter still lives in [place], 2 brothers in [place], 1 brother in [place], sister in [place]. Pt is on disability x 10 years. Her husband is retired in his 21s. She is "not very mobile", gets out for doctors visits, grocery store. She uses walker to get around. She does the cooking, laundry, her husband does most other household chores.   Starting gel shots in knees.   Recent office visits: 03/30/21 Danise Mina (PCP) - Controlled type 2 diabetes mellitus with complication. Start Gabapentin for paresthesias D/c Topiramate.   01/10/21 Danise Mina (PCP) - Intractable migraine with status migrainosus. No med changes. Given Dexamethasone/ketorolac/promethazine injection in clinic. Ordered Nurtec.   12/27/20 Danise Mina (PCP) - Controlled type 2 diabetes mellitus with  complication. Stop Novolin N due to low A1c, hypoglycemia.  Recent consult visits: 04/21/21 Agbor-Etang (Cardiology) - F/U CAD. Chest discomfort improved with higher dose of Imdur. No med changes.   03/07/21 Ahern  (Neurology) - Chronic migraine without aura, with intractable migraine. Start Ajovy 225 mg.   7/03/28/21 Agbor-Etang (Cardiology) - F/U CAD. Start Torsemide. Increase Isosorbide to 30 mg.  02/24/21 Patsey Berthold (Pulmonary) - Stage 1 mild COPD by GOLD classification. Start Trelegy. D/c Novolin & Anoro Ellipta. (Changed to [facility] 7/25 due to cost)   02/16/21 Volanda Napoleon (Pulmonary) - OSA. Night study done. No med changes.   01/14/21 Krasinski (Orthopedic) - Osteoarthritis of knee, unspecified. Triamcinolone injection.  01/05/21 Henrene Pastor (Physical Therapy) - Osteoarthritis of right knee joint.   12/17/20 Krasinski (Orthopedic) - Osteoarthritis of knee, unspecified.   12/07/20 Henrene Pastor (Physical Therapy) - Unilateral primary osteoarthritis, right knee. Visits every 2 wks.   12/06/20 Agbor-Etang (Cardiology) - Coronary artery disease involving native coronary artery of native heart without angina pectoris. EKG 12-lead. No med changes.   11/04/20 Lemmon Gertie Fey) - Diarrhea, unspecified. Start Ondansetron 4 mg.  Hospital visits: None in previous 6 months   Objective:  Lab Results  Component Value Date   CREATININE 0.93 03/04/2021   BUN 19 03/04/2021   GFR 86.14 09/27/2020   GFRNONAA >60 03/04/2021   GFRAA 80 08/26/2020   NA 137 03/04/2021   K 4.0 03/04/2021   CALCIUM 8.8 (L) 03/04/2021   CO2 22 03/04/2021   GLUCOSE 128 (H) 03/04/2021    Lab Results  Component Value Date/Time   HGBA1C 6.0 (A) 03/30/2021 09:58 AM   HGBA1C 5.6 12/27/2020 10:58 AM   HGBA1C 5.9 (H) 07/30/2020 12:00 PM   HGBA1C 6.4 05/21/2020 09:15 AM   GFR 86.14 09/27/2020 11:17 AM   GFR 70.60 09/14/2020 01:15 PM   MICROALBUR 2.4 (H) 12/27/2020 11:54 AM   MICROALBUR 5.8 (H) 01/15/2015 10:48 AM    Last diabetic Eye exam:  Lab Results  Component Value Date/Time   HMDIABEYEEXA No Retinopathy 07/08/2020 12:00 AM    Last diabetic Foot exam:  Lab Results  Component Value Date/Time   HMDIABFOOTEX done 02/25/2013 12:00 AM     Lab  Results  Component Value Date   CHOL 131 05/21/2020   HDL 42.80 05/21/2020   LDLCALC 48 12/08/2013   LDLDIRECT 65.0 05/21/2020   TRIG 239.0 (H) 05/21/2020   CHOLHDL 3 05/21/2020    Hepatic Function Latest Ref Rng & Units 09/14/2020 08/02/2020 07/30/2020  Total Protein 6.0 - 8.3 g/dL 7.3 7.3 7.5  Albumin 3.5 - 5.2 g/dL 4.1 3.8 3.9  AST 0 - 37 U/L '13 20 20  ' ALT 0 - 35 U/L '9 20 20  ' Alk Phosphatase 39 - 117 U/L 65 40 41  Total Bilirubin 0.2 - 1.2 mg/dL 0.3 0.6 0.7  Bilirubin, Direct 0.0 - 0.3 mg/dL - - -    Lab Results  Component Value Date/Time   TSH 1.37 09/14/2020 01:15 PM   TSH 2.79 05/21/2020 09:15 AM   FREET4 0.94 04/10/2017 09:53 AM   FREET4 0.79 03/30/2016 08:52 AM    CBC Latest Ref Rng & Units 02/25/2021 10/27/2020 09/27/2020  WBC 4.0 - 10.5 K/uL 11.7(H) 17.9(H) 12.2(H)  Hemoglobin 12.0 - 15.0 g/dL 10.7(L) 10.8(L) 11.0(L)  Hematocrit 36.0 - 46.0 % 33.1(L) 33.5(L) 33.3(L)  Platelets 150 - 400 K/uL 427(H) 400 499(H)    No results found for: VD25OH  Clinical ASCVD: Yes  The 10-year ASCVD risk score (Arnett DK, et al., 2019) is:  15.8%   Values used to calculate the score:     Age: 8 years     Sex: Female     Is Non-Hispanic African American: No     Diabetic: Yes     Tobacco smoker: No     Systolic Blood Pressure: 626 mmHg     Is BP treated: Yes     HDL Cholesterol: 42.8 mg/dL     Total Cholesterol: 131 mg/dL    Depression screen [facility] 2/9 11/23/2020 10/11/2020 05/26/2020  Decreased Interest 0 1 0  Down, Depressed, Hopeless 0 0 0  PHQ - 2 Score 0 1 0  Altered sleeping 0 1 0  Tired, decreased energy 0 1 0  Change in appetite 0 2 0  Feeling bad or failure about yourself  0 0 0  Trouble concentrating 0 0 0  Moving slowly or fidgety/restless 0 0 0  Suicidal thoughts 0 0 0  PHQ-9 Score 0 5 0  Difficult doing work/chores Not difficult at all Not difficult at all -  Some recent data might be hidden    GAD 7 : Generalized Anxiety Score 05/26/2020  Nervous, Anxious, on  Edge 0  Control/stop worrying 0  Worry too much - different things 0  Trouble relaxing 0  Restless 0  Easily annoyed or irritable 0  Afraid - awful might happen 0  Total GAD 7 Score 0     Social History   Tobacco Use  Smoking Status Former   Packs/day: 2.00   Years: 43.00   Pack years: 86.00   Types: Cigarettes   Quit date: 05/17/2010   Years since quitting: 10.9  Smokeless Tobacco Never  Tobacco Comments   QUIT 2011   BP Readings from Last 3 Encounters:  04/21/21 122/60  03/30/21 134/62  03/07/21 112/68   Pulse Readings from Last 3 Encounters:  04/21/21 91  03/30/21 100  03/07/21 78   Wt Readings from Last 3 Encounters:  04/21/21 236 lb (107 kg)  03/30/21 231 lb 1 oz (104.8 kg)  03/07/21 230 lb 9.6 oz (104.6 kg)   BMI Readings from Last 3 Encounters:  04/21/21 43.16 kg/m  03/30/21 42.26 kg/m  03/07/21 42.18 kg/m    Assessment/Interventions: Review of patient past medical history, allergies, medications, health status, including review of consultants reports, laboratory and other test data, was performed as part of comprehensive evaluation and provision of chronic care management services.   SDOH:  (Social Determinants of Health) assessments and interventions performed: Yes  SDOH Screenings   Alcohol Screen: Low Risk    Last Alcohol Screening Score (AUDIT): 0  Depression (PHQ2-9): Low Risk    PHQ-2 Score: 0  Financial Resource Strain: Low Risk    Difficulty of Paying Living Expenses: Not hard at all  Food Insecurity: No Food Insecurity   Worried About Charity fundraiser in the Last Year: Never true   Ran Out of Food in the Last Year: Never true  Housing: Low Risk    Last Housing Risk Score: 0  Physical Activity: Inactive   Days of Exercise per Week: 0 days   Minutes of Exercise per Session: 0 min  Social Connections: Not on file  Stress: No Stress Concern Present   Feeling of Stress : Not at all  Tobacco Use: Medium Risk   Smoking Tobacco Use:  Former   Smokeless Tobacco Use: Never  Transportation Needs: No Data processing manager (Medical): No   Lack of Transportation (Non-Medical): No  CCM Care Plan  Allergies  Allergen Reactions   Crestor [Rosuvastatin] Other (See Comments)    myalgias   Lipitor [Atorvastatin] Other (See Comments)    Mood swings   Lithium Other (See Comments)    abd pain, n/v, was hospitalized   Quetiapine Other (See Comments)    Muscle twitching, muscle spasm    Medications Reviewed Today     Reviewed by Charlton Haws, [facility] (Pharmacist) on 05/11/21 at 1202  Med List Status: <None>   Medication Order Taking? Sig Documenting Provider Last Dose Status Informant  acetaminophen (TYLENOL) 650 MG CR tablet [id number] Yes Take 1,300 mg by mouth 2 (two) times daily. [provider] Taking Active   albuterol (VENTOLIN HFA) 108 (90 Base) MCG/ACT inhaler [id number] Yes Inhale 2 puffs into the lungs every 6 (six) hours as needed for wheezing or shortness of breath. Tyler Pita, MD Taking Active   Apple Cider Vinegar 600 MG CAPS [id number] Yes  [provider] Taking Active   aspirin 81 MG EC tablet [id number] Yes Take 1 tablet (81 mg total) by mouth daily. Kate Sable, MD Taking Active   aspirin-acetaminophen-caffeine [facility] MIGRAINE) [phone] MG tablet [id number] Yes Take by mouth every 6 (six) hours as needed for headache. [provider] Taking Active   Boswellia-Glucosamine-Vit D (OSTEO BI-FLEX ONE PER DAY PO) [id number] Yes Take 1 tablet by mouth daily. [provider] Taking Active   BREZTRI AEROSPHERE 160-9-4.8 MCG/ACT AERO [id number] No INHALE 2 PUFFS INTO THE LUNGS IN THE MORNING AND AT BEDTIME.  Patient not taking: Reported on 05/11/2021   Tyler Pita, MD Not Taking Active   cholecalciferol (VITAMIN D3) 25 MCG (1000 UNIT) tablet [id number] Yes Take 1,000 Units by mouth daily. [provider] Taking Active    cyclobenzaprine (FLEXERIL) 10 MG tablet [id number] Yes TAKE 1/2 TO 1 TABLET BY MOUTH TWICE A DAY AS NEEDED FOR MUSCLE SPASMS AND MIGRAINE Ria Bush, MD Taking Active   famotidine (PEPCID) 20 MG tablet [id number] Yes Take 20 mg by mouth every other day. Alternates with omeprazole [provider] Taking Active Self  Fremanezumab-vfrm (AJOVY) 225 MG/1.5ML SOAJ [id number] Yes Inject 225 mg into the skin every 30 (thirty) days. Melvenia Beam, MD Taking Active   gabapentin (NEURONTIN) 100 MG capsule [id number] Yes Take 1-3 capsules (100-300 mg total) by mouth at bedtime. Ria Bush, MD Taking Active   isosorbide mononitrate (IMDUR) 30 MG 24 hr tablet [id number]  Take 1 tablet (30 mg total) by mouth daily. Kate Sable, MD  Expired 04/26/21 2359   levothyroxine (SYNTHROID) 100 MCG tablet [id number] Yes TAKE 1 TABLET DAILY, AND ON2 DAYS A WEEK, TAKE AN     EXTRA 1/2 TABLET Ria Bush, MD Taking Active   loratadine (CLARITIN) 10 MG tablet [id number] Yes Take 1 tablet (10 mg total) by mouth daily. Ria Bush, MD Taking Active Self  Melatonin 5 MG CAPS [id number] Yes Take by mouth. [provider] Taking Active   metFORMIN (GLUCOPHAGE) 500 MG tablet [id number] Yes Take 1 tablet (500 mg total) by mouth 2 (two) times daily with a meal. Ria Bush, MD Taking Active   Multiple Vitamin (MULTIVITAMIN WITH MINERALS) TABS tablet [id number] Yes Take 1 tablet by mouth daily. One A Day for Women [provider] Taking Active Self  naproxen sodium (ALEVE) 220 MG tablet [id number] Yes Take 220 mg by mouth 2 (two) times daily with a meal. [provider] Taking Active   nystatin cream (MYCOSTATIN) [id number] Yes APPLY TO  AFFECTED AREA TWICE A DAY Theora Gianotti, NP Taking Active   omeprazole (PRILOSEC) 20 MG capsule [id number] Yes TAKE 1 DAILY-ALTERNATES WITH FAMOTIDINE  Patient taking differently: Take 20 mg by mouth every other day. Take 1  daily-Alternates with famotidine   Ria Bush, MD Taking Active   ondansetron (ZOFRAN ODT) 4 MG disintegrating tablet [id number] Yes Take 1 tablet by mouth every 4- 6 hours as needed Levin Erp, PA Taking Active   ONE [facility] LANCETS [place] [id number] Yes Use to check sugar twice daily Dx: E11.8 Ria Bush, MD Taking Active Self  [facility] ULTRA test strip [id number] Yes USE TO CHECK BLOOD SUGAR 2 TIMES   DAILY Ria Bush, MD Taking Active   OXYGEN [id number] Yes Inhale 2 L into the lungs at bedtime. [provider] Taking Active Self  pioglitazone (ACTOS) 15 MG tablet [id number] Yes Take 1 tablet (15 mg total) by mouth daily. Ria Bush, MD Taking Active   Rhubarb (ESTROVEN COMPLETE PO) [id number] Yes Take 1 tablet by mouth daily. [provider] Taking Active Self  simvastatin (ZOCOR) 40 MG tablet [id number] Yes TAKE 1 TABLET AT BEDTIME Ria Bush, MD Taking Active   torsemide (DEMADEX) 20 MG tablet [id number] Yes Take 1 tablet (20 mg total) by mouth daily. Kate Sable, MD Taking Active   vitamin B-12 (CYANOCOBALAMIN) 1000 MCG tablet [id number] Yes Take 1,000 mcg by mouth daily. [provider] Taking Active   ziprasidone (GEODON) 40 MG capsule [id number] Yes Take 1 capsule (40 mg total) by mouth in the morning and at bedtime. Ria Bush, MD Taking Active             Patient Active Problem List   Diagnosis Date Noted   Unilateral occipital headache 01/10/2021   Abdominal aortic ectasia (HCC) 10/09/2020   Back pain 09/27/2020   Gross hematuria 09/14/2020   S/P CABG x 1 08/03/2020   Abnormal cardiovascular stress test    Aortic atherosclerosis ([place]) 04/15/2020   COPD (chronic obstructive pulmonary disease) ([place]) 04/15/2020   Pill dysphagia 04/05/2020   Cervical neck pain with evidence of disc disease 10/23/2019   Left hip pain 10/23/2019   Right knee pain 10/23/2019   Osteoarthritis 10/23/2019   Anemia  10/23/2019   Weight loss, unintentional 10/05/2019   Hepatic steatosis 05/03/2019   Thrombocytosis 04/24/2019   Abdominal pain 04/24/2019   Migraine 04/19/2018   Obesity, morbid, BMI 40.0-49.9 ([place]) 08/17/2017   Palpitations 01/08/2017   Mass of right side of neck 07/10/2016   Neutrophilia 04/30/2015   Advanced care planning/counseling discussion 01/22/2015   Health maintenance examination 01/22/2015   Medicare annual wellness visit, subsequent 12/15/2013   Tachycardia 09/13/2012   Osteoarthritis of left knee 12/11/2011   Candidal intertrigo 05/10/2011   Hypertension    Bipolar disorder ([place])    Fibromyalgia    Hyperlipidemia associated with type 2 diabetes mellitus ([place])    GERD (gastroesophageal reflux disease)    Hypothyroidism 04/05/2011   OSA (obstructive sleep apnea) 03/16/2011   CAD (coronary artery disease) 03/16/2011   Diabetes mellitus type 2, controlled, with complications ([place]) 03/01/1974   Dyspnea 01/03/2011    Immunization History  Administered Date(s) Administered   Fluad Quad(high Dose 65+) 04/24/2019, 05/26/2020   Influenza Split 05/09/2011, 06/11/2012   Influenza Whole 05/20/2010   Influenza,inj,Quad PF,6+ Mos 07/14/2013, 06/16/2014, 04/30/2015, 05/31/2016, 05/10/2017, 04/19/2018   Moderna SARS-COV2 Booster Vaccination 06/21/2020, 12/31/2020   Moderna Sars-Covid-2 Vaccination 10/18/2019, 11/15/2019   Pneumococcal Conjugate-13 11/14/2017, 04/19/2018   Pneumococcal Polysaccharide-23 04/18/2011  Td 04/18/2011   Zoster Recombinat (Shingrix) 11/14/2017, 01/02/2018   Zoster, Live 12/29/2013    Conditions to be addressed/monitored:  Hypertension, Hyperlipidemia, Diabetes, Coronary Artery Disease, GERD, COPD, Hypothyroidism, Osteoarthritis, and Bipolar disorder  Care Plan : [place]  Updates made by Charlton Haws, [place] since 05/11/2021 12:00 AM     Problem: CHL AMB "PATIENT-SPECIFIC PROBLEM"      Long-Range Goal: Disease management    Start Date: 05/11/2021  Expected End Date: 05/11/2022  This Visit's Progress: On track  Priority: High  Note:   Current Barriers:  Unable to independently monitor therapeutic efficacy Suboptimal therapeutic regimen for diabetes  Pharmacist Clinical Goal(s):  Patient will achieve adherence to monitoring guidelines and medication adherence to achieve therapeutic efficacy adhere to plan to optimize therapeutic regimen for diabetes as evidenced by report of adherence to recommended medication management changes through collaboration with PharmD and provider.   Interventions: 1:1 collaboration with Ria Bush, MD regarding development and update of comprehensive plan of care as evidenced by provider attestation and co-signature Inter-disciplinary care team collaboration (see longitudinal plan of care) Comprehensive medication review performed; medication list updated in electronic medical record  Hypertension (BP goal <130/80) -Controlled - BP is at goal home, on averae; pt reports marked improvement in chest pain since increasing Imdur; she reports mild swelling in legs, improved since starting torsemide; she reports minimal improvement in SOB since starting torsemide -ECHO 05/2020 - LVEF 40% and Grade 1 diastolic dysfunction -Denies hypotensive/hypertensive symptoms -Current home readings: 120-140/40-50s (checking every day) -Current treatment: Isosorbide MN 30 mg daily Torsemide 20 mg daily -Educated on BP goals and benefits of medications for prevention of heart attack, stroke and kidney damage; Daily salt intake goal < 2300 mg; -Counseled that torsemide was added by cardiologist to help with SOB -Counseled to monitor BP at home daily -Recommended to continue current medication  Hyperlipidemia: (LDL goal < 70) -Controlled - LDL is at goal; pt endorses compliance with statin and denies issues -Hx CAD, CABG 2012, 07/2020 -Current treatment: Simvastatin 40 mg daily Aspirin 81  mg daily -Educated on Cholesterol goals; Benefits of statin for ASCVD risk reduction; -Recommended to continue current medication  Diabetes (A1c goal <7%) -Controlled - A1c was at goal in Aug 2022 on current regimen; pt endorses compliance with metformin and pioglitazone; she does endorse mild swelling in the legs (she is taking torsemide as well) which pioglitazone may be contributing -Current home glucose readings fasting glucose: 105-130, 170 once post prandial glucose: 150-170, 210 once -Denies hypoglycemic/hyperglycemic symptoms -Current medications: Metformin 500 mg BID Pioglitazone 15 mg daily Testing supplies -Medications previously tried: Novolin N, glimepiride  -Current meal patterns:  dinner: pizza once a week, hamburgers (no buns), salad, spaghetti every 1-2 weeks, beefaroni  drinks: diet soda, water (4-5 bottles per day) -Current exercise: step exercises - 4000 steps/day (limited d/t knees, SOB) -Educated on A1c and blood sugar goals; -Counseled on pioglitazone risks - weight gain, peripheral edema, heart failure; pt did have reduced ejection fraction (40%) on ECHO 05/2020, cath 06/2020 estimated normal EF but difficult to visualize; -Discussed benefits of newer DM medications (GLP-1, SGLT-2 inhibitor); given cardiac history Ozempic would be a good option for secondary ASCVD prevention as well as weight loss; an SGLT2-I would also be beneficial for ASCVD risk reduction and swelling (Farxiga can be obtained free through AZ&Me -pt is already approved for [employer]) -Recommend to discontinue pioglitazone due to CV risks/edema/wt gain issues; monitor BG on metformin alone, if warranted may consider  adding Farxiga or Ozempic for additional CV/wt loss benefits in the future (Pt assistance available)  COPD (Goal: control symptoms and prevent exacerbations) -Not ideally controlled - pt is not currently using Breztri due to high cost in donut hole ($147 per month); she is using albuterol  every other day for symptoms; she reports symptoms were better controlled daily Breztri -Gold Grade: Gold 1 (FEV1>80%) -Current COPD Classification:  A (low sx, <2 exacerbations/yr) -MMRC/CAT score: not on file -Pulmonary function testing: 04/20/20 - FEV1 84% predicted, FEV1/FVC 0.76 -Exacerbations requiring treatment in last 6 months: 0 -Current treatment  Breztri 160-9-4.8 mcg/act 2 puffs BID - not using ($147/month) Albuterol HFA prn Loratadine 10 mg daily Oxygen 2L HS -Medications previously tried: Trelegy ($$), Anoro, Symbciort  -Patient denies consistent use of maintenance inhaler -Frequency of rescue inhaler use: every other day -Assessed patient finances. She will qualify for [employer]Me pt assistance given household income ~$40k. Applied in online portal with patient and she was successfully enrolled in the program. AZ&Me will need prescription faxed to [employer] [phone]) or e-scribed to [facility] pharmacy - will consult with pulmonary office for Rx  Bipolar disorder (Goal: manage symptoms) -Controlled - Pt reports "ziprasidone is a life saver"; she never wants to change this med -Current treatment: Ziprasidone 40 mg BID -Medications previously tried/failed: quetiapine, diazepam,  -PHQ9: 0 (11/2020) -GAD7: 0 (05/2020) -Connected with PCP for mental health support -Recommended to continue current medication  Hypothyroidism (Goal: maintain TSH in goal range) -Controlled -TSH is at goal; pt denies s/sx of hypo- or hyperthyroidism -Current treatment  Levothyroxine 100 mcg daily + extra 1/2 tab 2x a week (MF) -Recommended to continue current medication  GERD (Goal: manage symptoms) -Controlled - pt reports symptoms are mostly controlled, certain foods will exacerbate issues -Current treatment  Omeprazole 20 mg QOD (alternates with famotidine) Famotidine 20 mg QOD (alternates with omeprazole) -Counseled to take famotidine every day and use omeprazole PRN for breakthrough  symptoms  Migraines (Goal: prevent episodes) -Not ideally controlled - pt reports she is still getting headaches on Ajovy 3-4 days a week, which is an improvement from every day; she reports she is using samples currently and will not be able to afford Ajovy through her insurance; she wants to contact her neurologist for an alternative since she does not think Ajovy is helping very much anyway -Current treatment  Ajovy 225 mg q 30 days (Dr Jaynee Eagles) Cyclobenzaprine 10 mg PRN  Excedrin PRN (APAP/caffeine/ASA) -Medications previously tried: Nurtec, topamax  -Assessed pt finances - she may qualify for Ajovy pt assistance, however they do not publish their eligibility criteria so we cannot know for sure until she applies;  -Discussed that there are other medications available for migraine prevention that can be tried; pt to discuss her concerns with her neurologist  Pain (Goal: manage symptoms) -Not ideally controlled - pt reports she will be starting gel injections in her knees soon; she is taking 100 mg of gabapentin currently and thinking about increasing to 200 mg -osteoarthritis of knee, cervical pain, fibromyalgia -Current treatment  Cyclobenzaprine 10 mg PRN Gabapentin 100 mg 1-3 cap HS Naproxen - 2 tab BID Tylenol 650 mg - 2 tab BID -Counseled on benefits/risks of increasing gabapentin, advised it is safe to try higher dose -Counseled on risks of chronic NSAID use - she is taking high dose naproxen daily for months now; advised her to use the lowest effective dose of NSAID to control her pain in order to reduce CV and kidney risks -Recommend to  reduce naproxen to 1 tablet daily, up to 2 daily if needed  Health Maintenance -Vaccine gaps: flu, TDAP, covid booster -Current therapy:  Ondansetron ODT 4 mg PRN Apple cider vinegar capsules Boswellia-glucosamide-vitamin D (osteo bi flex) Rhubarb (estroven complete) (hx hysterectomy) Multivitamin Vitamin D3  Vitamin B12 1000 mg  daily Melatonin 5 mg daily -Patient is satisfied with current therapy and denies issues -Recommended to continue current medication  Patient Goals/Self-Care Activities Patient will:  - take medications as prescribed focus on medication adherence by pill box check glucose twice a day, document, and provide at future appointments check blood pressure daily, document, and provide at future appointments weigh daily, and contact provider if weight gain of 3+ lbs overnight, 5+lbs in a week collaborate with provider on medication access solutions Judithann Sauger) -Reduce naproxen to 1 tablet daily, up to 2 daily if needed -May increase gabapentin up to 3 capsules at bedtime if needed -Contact neurologist regarding Ajovy efficacy and cost       Medication Assistance:  Breztri - AZ&Me approved 05/11/21. In need of Rx to [facility] pharmacy.  Compliance/Adherence/Medication fill history: Care Gaps: None  Star-Rating Drugs: Pioglitazone - LF 01/20/21 x 90 ds Simvastatin - LF 03/21/21 x 90 ds Metformin - LF 01/20/21 x 90 ds  [facility]  [id number] [place] [address]Phone: [phone]: [phone] [facility] [address] [place]: [phone]: [phone] Uses pill box? Yes - 3 weeks at a time Pt endorses 100% compliance  We discussed: Current pharmacy is preferred with insurance plan and patient is satisfied with pharmacy services -Pt requests gabapentin and torsemide refills [facility] not [facility]. Will request gabapentin from PCP, pt will request torsemide from cardiologist  Patient decided to: Continue current medication management strategy  Care Plan and Follow Up Patient Decision:  Patient agrees to Care Plan and Follow-up.  Plan: Telephone follow up  appointment with care management team member scheduled for:  3 months  LCharlene Brooke PharmD, [place] CPP Clinical Pharmacist [facility] [phone]

## 2021-05-11 NOTE — Addendum Note (Signed)
Addended by: Emelia Salisbury C on: 05/11/2021 04:58 PM   Modules accepted: Orders

## 2021-05-11 NOTE — Telephone Encounter (Signed)
Last refill 03/30/21 #60/[facility] Last office visit 03/30/21 Upcoming appointment 09/07/21

## 2021-05-11 NOTE — Telephone Encounter (Signed)
*  STAT* If patient is at the pharmacy, call can be transferred to refill team.   1. Which medications need to be refilled? (please list name of each medication and dose if known) Torsemide  2. Which pharmacy/location (including street and city if local pharmacy) is [facility]  3. Do they need a 30 day or 90 day supply? [place]

## 2021-05-11 NOTE — Telephone Encounter (Signed)
Patient requests refill for gabapentin 100 mg - #270 (90 day supply). She would like this sent to her mail order pharmacy:  [place], [place] to [address] [place] [address] Phone: [phone] Fax: [phone]  Will forward to PCP.

## 2021-05-11 NOTE — Patient Instructions (Signed)
Visit Information  Phone number for Pharmacist: [phone]  Thank you for meeting with me to discuss your medications! I look forward to working with you to achieve your health care goals. Below is a summary of what we talked about during the visit:   Goals Addressed             This Visit's Progress    Manage My Medicine       Timeframe:  Long-Range Goal Priority:  High Start Date:     05/11/21                         Expected End Date:  05/11/22                     Follow Up Date Dec 2022   - call for medicine refill 2 or 3 days before it runs out - call if I am sick and can't take my medicine - keep a list of all the medicines I take; vitamins and herbals too - use a pillbox to sort medicine  -Reduce naproxen to 1 tablet daily, up to 2 daily if needed -May increase gabapentin up to 3 capsules at bedtime if needed -Contact neurologist regarding Ajovy efficacy and cost   Why is this important?   These steps will help you keep on track with your medicines.   Notes:         Care Plan : [place]  Updates made by Charlton Haws, RPH since 05/11/2021 12:00 AM     Problem: [facility] AMB "PATIENT-SPECIFIC PROBLEM"      Long-Range Goal: Disease management   Start Date: 05/11/2021  Expected End Date: 05/11/2022  This Visit's Progress: On track  Priority: High  Note:   Current Barriers:  Unable to independently monitor therapeutic efficacy Suboptimal therapeutic regimen for diabetes  Pharmacist Clinical Goal(s):  Patient will achieve adherence to monitoring guidelines and medication adherence to achieve therapeutic efficacy adhere to plan to optimize therapeutic regimen for diabetes as evidenced by report of adherence to recommended medication management changes through collaboration with PharmD and provider.   Interventions: 1:1 collaboration with Ria Bush, MD regarding development and update of comprehensive plan of care as evidenced by  provider attestation and co-signature Inter-disciplinary care team collaboration (see longitudinal plan of care) Comprehensive medication review performed; medication list updated in electronic medical record  Hypertension (BP goal <130/80) -Controlled - BP is at goal home, on averae; pt reports marked improvement in chest pain since increasing Imdur; she reports mild swelling in legs, improved since starting torsemide; she reports minimal improvement in SOB since starting torsemide -ECHO 05/2020 - LVEF 40% and Grade 1 diastolic dysfunction -Denies hypotensive/hypertensive symptoms -Current home readings: 120-140/40-50s (checking every day) -Current treatment: Isosorbide [place] 30 mg daily Torsemide 20 mg daily -Educated on BP goals and benefits of medications for prevention of heart attack, stroke and kidney damage; Daily salt intake goal < 2300 mg; -Counseled that torsemide was added by cardiologist to help with SOB -Counseled to monitor BP at home daily -Recommended to continue current medication  Hyperlipidemia: (LDL goal < 70) -Controlled - LDL is at goal; pt endorses compliance with statin and denies issues -Hx CAD, CABG 2012, 07/2020 -Current treatment: Simvastatin 40 mg daily Aspirin 81 mg daily -Educated on Cholesterol goals; Benefits of statin for ASCVD risk reduction; -Recommended to continue current medication  Diabetes (A1c goal <7%) -Controlled - A1c was at goal in  Aug 2022 on current regimen; pt endorses compliance with metformin and pioglitazone; she does endorse mild swelling in the legs (she is taking torsemide as well) which pioglitazone may be contributing -Current home glucose readings fasting glucose: 105-130, 170 once post prandial glucose: 150-170, 210 once -Denies hypoglycemic/hyperglycemic symptoms -Current medications: Metformin 500 mg BID Pioglitazone 15 mg daily Testing supplies -Medications previously tried: Novolin N, glimepiride  -Current meal  patterns:  dinner: pizza once a week, hamburgers (no buns), salad, spaghetti every 1-2 weeks, beefaroni  drinks: diet soda, water (4-5 bottles per day) -Current exercise: step exercises - 4000 steps/day (limited d/t knees, SOB) -Educated on A1c and blood sugar goals; -Counseled on pioglitazone risks - weight gain, peripheral edema, heart failure; pt did have reduced ejection fraction (40%) on ECHO 05/2020, cath 06/2020 estimated normal EF but difficult to visualize; -Discussed benefits of newer DM medications (GLP-1, SGLT-2 inhibitor); given cardiac history Ozempic would be a good option for secondary ASCVD prevention as well as weight loss; an SGLT2-I would also be beneficial for ASCVD risk reduction and swelling (Farxiga can be obtained free through AZ&Me -pt is already approved for [employer]) -Recommend to discontinue pioglitazone due to CV risks/edema/wt gain issues; monitor BG on metformin alone, if warranted may consider adding Farxiga or Ozempic for additional CV/wt loss benefits in the future (Pt assistance available)  COPD (Goal: control symptoms and prevent exacerbations) -Not ideally controlled - pt is not currently using Breztri due to high cost in donut hole ($147 per month); she is using albuterol every other day for symptoms; she reports symptoms were better controlled daily Breztri -Gold Grade: Gold 1 (FEV1>80%) -Current COPD Classification:  A (low sx, <2 exacerbations/yr) -MMRC/CAT score: not on file -Pulmonary function testing: 04/20/20 - FEV1 84% predicted, FEV1/FVC 0.76 -Exacerbations requiring treatment in last 6 months: 0 -Current treatment  Breztri 160-9-4.8 mcg/act 2 puffs BID - not using ($147/month) Albuterol HFA prn Loratadine 10 mg daily Oxygen 2L HS -Medications previously tried: Trelegy ($$), Anoro, Symbciort  -Patient denies consistent use of maintenance inhaler -Frequency of rescue inhaler use: every other day -Assessed patient finances. She will qualify for  AZ&Me pt assistance given household income ~$40k. Applied in online portal with patient and she was successfully enrolled in the program. AZ&Me will need prescription faxed to [employer] [phone]) or e-scribed to [facility] pharmacy - will consult with pulmonary office for Rx  Bipolar disorder (Goal: manage symptoms) -Controlled - Pt reports "ziprasidone is a life saver"; she never wants to change this med -Current treatment: Ziprasidone 40 mg BID -Medications previously tried/failed: quetiapine, diazepam,  -PHQ9: 0 (11/2020) -GAD7: 0 (05/2020) -Connected with PCP for mental health support -Recommended to continue current medication  Hypothyroidism (Goal: maintain TSH in goal range) -Controlled -TSH is at goal; pt denies s/sx of hypo- or hyperthyroidism -Current treatment  Levothyroxine 100 mcg daily + extra 1/2 tab 2x a week (MF) -Recommended to continue current medication  GERD (Goal: manage symptoms) -Controlled - pt reports symptoms are mostly controlled, certain foods will exacerbate issues -Current treatment  Omeprazole 20 mg QOD (alternates with famotidine) Famotidine 20 mg QOD (alternates with omeprazole) -Counseled to take famotidine every day and use omeprazole PRN for breakthrough symptoms  Migraines (Goal: prevent episodes) -Not ideally controlled - pt reports she is still getting headaches on Ajovy 3-4 days a week, which is an improvement from every day; she reports she is using samples currently and will not be able to afford Ajovy through her insurance; she wants to contact her  neurologist for an alternative since she does not think Ajovy is helping very much anyway -Current treatment  Ajovy 225 mg q 30 days (Dr Jaynee Eagles) Cyclobenzaprine 10 mg PRN  Excedrin PRN (APAP/caffeine/ASA) -Medications previously tried: Nurtec, topamax  -Assessed pt finances - she may qualify for Ajovy pt assistance, however they do not publish their eligibility criteria so we cannot know for sure  until she applies;  -Discussed that there are other medications available for migraine prevention that can be tried; pt to discuss her concerns with her neurologist  Pain (Goal: manage symptoms) -Not ideally controlled - pt reports she will be starting gel injections in her knees soon; she is taking 100 mg of gabapentin currently and thinking about increasing to 200 mg -osteoarthritis of knee, cervical pain, fibromyalgia -Current treatment  Cyclobenzaprine 10 mg PRN Gabapentin 100 mg 1-3 cap HS Naproxen - 2 tab BID Tylenol 650 mg - 2 tab BID -Counseled on benefits/risks of increasing gabapentin, advised it is safe to try higher dose -Counseled on risks of chronic NSAID use - she is taking high dose naproxen daily for months now; advised her to use the lowest effective dose of NSAID to control her pain in order to reduce CV and kidney risks -Recommend to reduce naproxen to 1 tablet daily, up to 2 daily if needed  Health Maintenance -Vaccine gaps: flu, TDAP, covid booster -Current therapy:  Ondansetron ODT 4 mg PRN Apple cider vinegar capsules Boswellia-glucosamide-vitamin D (osteo bi flex) Rhubarb (estroven complete) (hx hysterectomy) Multivitamin Vitamin D3  Vitamin B12 1000 mg daily Melatonin 5 mg daily -Patient is satisfied with current therapy and denies issues -Recommended to continue current medication  Patient Goals/Self-Care Activities Patient will:  - take medications as prescribed focus on medication adherence by pill box check glucose twice a day, document, and provide at future appointments check blood pressure daily, document, and provide at future appointments weigh daily, and contact provider if weight gain of 3+ lbs overnight, 5+lbs in a week collaborate with provider on medication access solutions [name]) -Reduce naproxen to 1 tablet daily, up to 2 daily if needed -May increase gabapentin up to 3 capsules at bedtime if needed -Contact neurologist regarding  Ajovy efficacy and cost       Ms. Kunath was given information about Chronic Care Management services today including:  CCM service includes personalized support from designated clinical staff supervised by her physician, including individualized plan of care and coordination with other care providers 24/7 contact phone numbers for assistance for urgent and routine care needs. Standard insurance, coinsurance, copays and deductibles apply for chronic care management only during months in which we provide at least 20 minutes of these services. Most insurances cover these services at 100%, however patients may be responsible for any copay, coinsurance and/or deductible if applicable. This service may help you avoid the need for more expensive face-to-face services. Only one practitioner may furnish and bill the service in a calendar month. The patient may stop CCM services at any time (effective at the end of the month) by phone call to the office staff.  Patient agreed to services and verbal consent obtained.   Patient verbalizes understanding of instructions provided today and agrees to view in [place].  Telephone follow up appointment with pharmacy team member scheduled for: 3 months  Charlene Brooke, PharmD, [place], CPP Clinical Pharmacist [place] at [facility] [phone]

## 2021-05-12 ENCOUNTER — Telehealth: Payer: Self-pay | Admitting: Pharmacist

## 2021-05-12 ENCOUNTER — Telehealth: Payer: Self-pay | Admitting: Pulmonary Disease

## 2021-05-12 MED ORDER — GABAPENTIN 100 MG PO CAPS: 100.0000 mg | ORAL_CAPSULE | Freq: Every day | ORAL | 1 refills | Status: DC

## 2021-05-12 MED ORDER — BREZTRI AEROSPHERE 160-9-4.8 MCG/ACT IN AERO
2.0000 | INHALATION_SPRAY | Freq: Two times a day (BID) | RESPIRATORY_TRACT | 11 refills | Status: DC
Start: 2021-05-12 — End: 2022-06-15

## 2021-05-12 NOTE — Progress Notes (Signed)
Called [facility] (Dr Patsey Berthold): [phone] and inform them patient has been approved for AZ&Me pt assistance for [facility], spoke with Lenna Sciara and she will send a message to the nurse to have a prescription sent to [place] in order to complete enrollment.   Orinda Kenner, [place] Clinical Pharmacists Assistant [phone]  Time Spent: 50

## 2021-05-12 NOTE — Telephone Encounter (Signed)
Spoke to patient, who stated that she was approved for patient assistance.  She is requesting Rx to be sent to [employer].  Rx has been printed and placed in Dr. Domingo Dimes folder for signature.

## 2021-05-12 NOTE — Telephone Encounter (Signed)
ERx 

## 2021-05-13 ENCOUNTER — Telehealth: Payer: Self-pay | Admitting: Primary Care

## 2021-05-13 DIAGNOSIS — M1711 Unilateral primary osteoarthritis, right knee: Secondary | ICD-10-CM | POA: Diagnosis not present

## 2021-05-14 ENCOUNTER — Other Ambulatory Visit: Payer: Self-pay | Admitting: Family Medicine

## 2021-05-16 ENCOUNTER — Telehealth: Payer: Self-pay | Admitting: Family Medicine

## 2021-05-16 NOTE — Telephone Encounter (Signed)
Reviewed CCM visit from [facility] Results  Component Value Date   HGBA1C 6.0 (A) 03/30/2021  Ok to stop actos, monitor sugars only on metformin.  Will consider ozempic or farxiga if further treatment needed.

## 2021-05-16 NOTE — Telephone Encounter (Signed)
I spoke with Lelon Frohlich at [facility] and their last notes state that the patient wanted to do home study but I had already spoke to [facility] with [facility] and stated that the doctor wants the patient to have and in lab sleep study not home sleep study. Lelon Frohlich stated she would call the patient when she got off the phone with me

## 2021-05-18 NOTE — Telephone Encounter (Signed)
I got a message from [facility] with [facility] and she states the patient has been scheduled for sleep study on 06/02/21

## 2021-05-19 NOTE — Telephone Encounter (Signed)
Faxed Rx has been sent to [employer].

## 2021-05-20 DIAGNOSIS — M1711 Unilateral primary osteoarthritis, right knee: Secondary | ICD-10-CM | POA: Diagnosis not present

## 2021-05-31 ENCOUNTER — Other Ambulatory Visit: Payer: Self-pay

## 2021-05-31 ENCOUNTER — Other Ambulatory Visit
Admission: RE | Admit: 2021-05-31 | Discharge: 2021-05-31 | Disposition: A | Discharge status: Home / Self Care | Payer: Medicare HMO | Source: Ambulatory Visit | Attending: Primary Care | Admitting: Primary Care

## 2021-05-31 DIAGNOSIS — Z01812 Encounter for preprocedural laboratory examination: Secondary | ICD-10-CM | POA: Diagnosis not present

## 2021-05-31 DIAGNOSIS — Z20822 Contact with and (suspected) exposure to covid-19: Secondary | ICD-10-CM | POA: Diagnosis not present

## 2021-06-01 ENCOUNTER — Encounter: Payer: Self-pay | Admitting: Family Medicine

## 2021-06-01 DIAGNOSIS — Z794 Long term (current) use of insulin: Secondary | ICD-10-CM

## 2021-06-01 LAB — SARS CORONAVIRUS 2 (TAT 6-24 HRS): SARS Coronavirus 2: NEGATIVE

## 2021-06-02 ENCOUNTER — Ambulatory Visit: Payer: Medicare HMO | Attending: Pulmonary Disease

## 2021-06-02 ENCOUNTER — Encounter: Payer: Self-pay | Admitting: Family Medicine

## 2021-06-02 DIAGNOSIS — Z6841 Body Mass Index (BMI) 40.0 and over, adult: Secondary | ICD-10-CM | POA: Diagnosis not present

## 2021-06-02 DIAGNOSIS — J449 Chronic obstructive pulmonary disease, unspecified: Secondary | ICD-10-CM | POA: Insufficient documentation

## 2021-06-02 DIAGNOSIS — G4733 Obstructive sleep apnea (adult) (pediatric): Secondary | ICD-10-CM | POA: Diagnosis not present

## 2021-06-02 MED ORDER — PIOGLITAZONE HCL 15 MG PO TABS: 15.0000 mg | ORAL_TABLET | Freq: Every day | ORAL | 0 refills | Status: DC

## 2021-06-02 NOTE — Telephone Encounter (Signed)
Updated flu shot and e-scribed Actos.

## 2021-06-03 ENCOUNTER — Other Ambulatory Visit: Payer: Self-pay

## 2021-06-03 NOTE — Telephone Encounter (Signed)
Updated pt's (and husband's) chart with Pneumovax 23 shot.

## 2021-06-05 ENCOUNTER — Other Ambulatory Visit: Payer: Self-pay | Admitting: Family Medicine

## 2021-06-06 ENCOUNTER — Telehealth: Payer: Self-pay

## 2021-06-06 DIAGNOSIS — J449 Chronic obstructive pulmonary disease, unspecified: Secondary | ICD-10-CM | POA: Diagnosis not present

## 2021-06-06 NOTE — Telephone Encounter (Signed)
Refill request Geodon Last refill 12/14/20 #180/1 Last office visit 03/30/21 Upcoming 09/07/21

## 2021-06-06 NOTE — Chronic Care Management (AMB) (Signed)
Chronic Care Management Pharmacy Assistant   Name: [name]  MRN: [id number] DOB: 08-Dec-1950   Reason for Encounter:  Diabetes Disease State    Recent office visits: 06/01/21-PCP-Patient calls in to report updates on vaccines  Recent consult visits:  05/20/21-Orthopedic-Patient presented for right knee pain-no data found  Hospital visits:  None in previous 6 months  Medications: Outpatient Encounter Medications as of 06/06/2021  Medication Sig   acetaminophen (TYLENOL) 650 MG CR tablet Take 1,300 mg by mouth 2 (two) times daily.   albuterol (VENTOLIN HFA) 108 (90 Base) MCG/ACT inhaler Inhale 2 puffs into the lungs every 6 (six) hours as needed for wheezing or shortness of breath.   Apple Cider Vinegar 600 MG CAPS    aspirin 81 MG EC tablet Take 1 tablet (81 mg total) by mouth daily.   aspirin-acetaminophen-caffeine (EXCEDRIN MIGRAINE) 250-250-65 MG tablet Take by mouth every 6 (six) hours as needed for headache.   Boswellia-Glucosamine-Vit D (OSTEO BI-FLEX ONE PER DAY PO) Take 1 tablet by mouth daily.   Budeson-Glycopyrrol-Formoterol (BREZTRI AEROSPHERE) 160-9-4.8 MCG/ACT AERO Inhale 2 puffs into the lungs in the morning and at bedtime.   cholecalciferol (VITAMIN D3) 25 MCG (1000 UNIT) tablet Take 1,000 Units by mouth daily.   cyclobenzaprine (FLEXERIL) 10 MG tablet TAKE 1/2 TO 1 TABLET BY MOUTH TWICE A DAY AS NEEDED FOR MUSCLE SPASMS AND MIGRAINE   famotidine (PEPCID) 20 MG tablet Take 20 mg by mouth every other day. Alternates with omeprazole   Fremanezumab-vfrm (AJOVY) 225 MG/1.5ML SOAJ Inject 225 mg into the skin every 30 (thirty) days.   gabapentin (NEURONTIN) 100 MG capsule Take 1-3 capsules (100-300 mg total) by mouth at bedtime.   isosorbide mononitrate (IMDUR) 30 MG 24 hr tablet Take 1 tablet (30 mg total) by mouth daily.   levothyroxine (SYNTHROID) 100 MCG tablet TAKE 1 TABLET DAILY, AND ON2 DAYS A WEEK, TAKE AN     EXTRA 1/2 TABLET   loratadine (CLARITIN) 10 MG  tablet Take 1 tablet (10 mg total) by mouth daily.   Melatonin 5 MG CAPS Take by mouth.   metFORMIN (GLUCOPHAGE) 500 MG tablet TAKE 1 TABLET TWICE DAILY  WITH MEALS   Multiple Vitamin (MULTIVITAMIN WITH MINERALS) TABS tablet Take 1 tablet by mouth daily. One A Day for Women   naproxen sodium (ALEVE) 220 MG tablet Take 220 mg by mouth 2 (two) times daily with a meal.   nystatin cream (MYCOSTATIN) APPLY TO AFFECTED AREA TWICE A DAY   omeprazole (PRILOSEC) 20 MG capsule TAKE 1 DAILY-ALTERNATES WITH FAMOTIDINE (Patient taking differently: Take 20 mg by mouth every other day. Take 1 daily-Alternates with famotidine)   ondansetron (ZOFRAN ODT) 4 MG disintegrating tablet Take 1 tablet by mouth every 4- 6 hours as needed   ONE TOUCH LANCETS MISC Use to check sugar twice daily Dx: E11.8   ONETOUCH ULTRA test strip USE TO CHECK BLOOD SUGAR 2 TIMES   DAILY   OXYGEN Inhale 2 L into the lungs at bedtime.   pioglitazone (ACTOS) 15 MG tablet Take 1 tablet (15 mg total) by mouth daily.   Rhubarb (ESTROVEN COMPLETE PO) Take 1 tablet by mouth daily.   simvastatin (ZOCOR) 40 MG tablet TAKE 1 TABLET AT BEDTIME   torsemide (DEMADEX) 20 MG tablet Take 1 tablet (20 mg total) by mouth daily.   vitamin B-12 (CYANOCOBALAMIN) 1000 MCG tablet Take 1,000 mcg by mouth daily.   ziprasidone (GEODON) 40 MG capsule Take 1 capsule (40 mg total)  by mouth in the morning and at bedtime.   No facility-administered encounter medications on file as of 06/06/2021.     Recent Relevant Labs: Lab Results  Component Value Date/Time   HGBA1C 6.0 (A) 03/30/2021 09:58 AM   HGBA1C 5.6 12/27/2020 10:58 AM   HGBA1C 5.9 (H) 07/30/2020 12:00 PM   HGBA1C 6.4 05/21/2020 09:15 AM   MICROALBUR 2.4 (H) 12/27/2020 11:54 AM   MICROALBUR 5.8 (H) 01/15/2015 10:48 AM    Kidney Function Lab Results  Component Value Date/Time   CREATININE 0.93 03/04/2021 10:44 AM   CREATININE 0.66 10/27/2020 09:55 AM   CREATININE 0.93 07/30/2015 04:52 PM    CREATININE 0.75 08/30/2012 06:04 AM   CREATININE 0.69 08/29/2012 06:27 AM   GFR 86.14 09/27/2020 11:17 AM   GFRNONAA >60 03/04/2021 10:44 AM   GFRNONAA >60 08/30/2012 06:04 AM   GFRAA 80 08/26/2020 02:39 PM   GFRAA >60 08/30/2012 06:04 AM     Contacted patient on 06/07/21 to discuss diabetes disease state.   Current antihyperglycemic regimen:   Metformin 500mg  take 1 tablet 2 times daily   Pioglitazone 15mg  take 1 tablet daily     Patient verbally confirms she is taking the above medications as directed. Yes The patient reports she is happy to be off insulin.  What diet changes have been made to improve diabetes control? Patient reports eating salads and drinking lots of water   What recent interventions/DTPs have been made to improve glycemic control: Recommended discontinue Actos due to CV risks.Patient continues to take Actos 15mg  1 tablet daily 06/01/21 Dr.G.refilled  ([facility] note DrG said ok to stop actos 05/16/21)   Have there been any recent hospitalizations or ED visits since last visit with CPP? No  Patient denies hypoglycemic symptoms, including Pale, Sweaty, Shaky, Hungry, Nervous/irritable, and Vision changes  Patient denies hyperglycemic symptoms, including blurry vision, excessive thirst, fatigue, polyuria, and weakness  How often are you checking your blood sugar? twice daily  What are your blood sugars ranging?  Fasting: 05/27/21-135,05/28/21-130,05/30/21-146,05/31/21-229,06/01/21-135,06/03/21-152,06/04/21-134 06/07/21-134  Bedtime: 05/27/21-140,05/28/21-133,05/30/21-188,05/31/21-146,06/01/21-131,06/03/21-195,06/04/21-147,06/06/21-163  During the week, how often does your blood glucose drop below 70? Never   Patient reports <110 she does not feel well. Lowest 03/21/21-89 (fasting)  she has glucose tablets to use if needed.  Are you checking your feet daily/regularly? Yes  Adherence Review: Is the patient currently on a STATIN medication? Yes Is the patient  currently on ACE/ARB medication? No Does the patient have >5 day gap between last estimated fill dates? No  Care Gaps: Annual wellness visit in last year? No  2015 Most recent A1C reading:6.0  03/30/21 Most Recent BP reading:122/60   91-P  Last eye exam / retinopathy screening:06/2020 Last diabetic foot exam: 03/30/21  Counseled patient on importance of annual eye and foot exam.   Star Rating Drugs:  Medication:  Last Fill: Day Supply Metformin 500mg  05/18/21 90 Actos 15mg   06/02/21 90 Simvastatin 40mg  03/21/21  90   CCM appointment on 08/10/21 and Pulmonology appointment with on 06/23/21  Charlene Brooke RPH,CPP   Avel Sensor, [place] Assistant [phone]  Total time spent for month CPA: 40 min

## 2021-06-07 ENCOUNTER — Telehealth (INDEPENDENT_AMBULATORY_CARE_PROVIDER_SITE_OTHER): Payer: Self-pay | Admitting: Pulmonary Disease

## 2021-06-07 DIAGNOSIS — G4733 Obstructive sleep apnea (adult) (pediatric): Secondary | ICD-10-CM

## 2021-06-07 NOTE — Telephone Encounter (Signed)
Please relay above message to patient. If very symptomatic make virtual visit to discuss treatment. She has an apt with Dr. Darnell Level on 06/23/21.

## 2021-06-07 NOTE — Telephone Encounter (Signed)
Very mild OSA, AHI 6/h Improved from prior Recommend  - no Rx or positional therapy alone unless very symptomatic

## 2021-06-08 NOTE — Telephone Encounter (Signed)
Pt aware of results, patient was pleased. Nothing further needed.

## 2021-06-16 ENCOUNTER — Encounter: Payer: Self-pay | Admitting: Family Medicine

## 2021-06-17 NOTE — Telephone Encounter (Signed)
Flexeril Last rx:  04/26/21, #30 Last OV:  03/30/21, 3 mo DM f/u Next OV:  09/07/20, AWV prt 2

## 2021-06-20 MED ORDER — CYCLOBENZAPRINE HCL 10 MG PO TABS: ORAL_TABLET | ORAL | 3 refills | Status: DC

## 2021-06-20 NOTE — Addendum Note (Signed)
Addended by: Charlton Haws on: 06/20/2021 05:04 PM   Modules accepted: Orders

## 2021-06-20 NOTE — Telephone Encounter (Signed)
Noted! Thank you

## 2021-06-20 NOTE — Telephone Encounter (Signed)
ERx 

## 2021-06-20 NOTE — Telephone Encounter (Addendum)
Contacted patient to discuss pioglitazone. PCP agreed on trial off of pioglitazone last month, pt has not started trial off yet.  Lab Results  Component Value Date/Time   HGBA1C 6.0 (A) 03/30/2021 09:58 AM   HGBA1C 5.6 12/27/2020 10:58 AM   HGBA1C 5.9 (H) 07/30/2020 12:00 PM   HGBA1C 6.4 05/21/2020 09:15 AM    Discussed potential risks of long term pioglitazone use include edema and heart failure. Pt may be able to control DM with metformin monotherapy. Pt agreed to hold pioglitazone until PCP follow up visit in January. She will contact PharmD or PCP if blood sugars are significantly elevated. May consider increasing metformin to 1000 mg BID if needed.

## 2021-06-22 DIAGNOSIS — M1711 Unilateral primary osteoarthritis, right knee: Secondary | ICD-10-CM | POA: Diagnosis not present

## 2021-06-23 ENCOUNTER — Other Ambulatory Visit: Payer: Self-pay

## 2021-06-23 ENCOUNTER — Ambulatory Visit: Payer: Medicare HMO | Admitting: Pulmonary Disease

## 2021-06-23 ENCOUNTER — Encounter: Payer: Self-pay | Admitting: Pulmonary Disease

## 2021-06-23 VITALS — BP 130/70 | HR 91 | Temp 97.9°F | Ht 62.0 in | Wt 236.4 lb

## 2021-06-23 DIAGNOSIS — R5381 Other malaise: Secondary | ICD-10-CM

## 2021-06-23 DIAGNOSIS — R0602 Shortness of breath: Secondary | ICD-10-CM | POA: Diagnosis not present

## 2021-06-23 DIAGNOSIS — G4733 Obstructive sleep apnea (adult) (pediatric): Secondary | ICD-10-CM

## 2021-06-23 DIAGNOSIS — G4736 Sleep related hypoventilation in conditions classified elsewhere: Secondary | ICD-10-CM

## 2021-06-23 DIAGNOSIS — E669 Obesity, unspecified: Secondary | ICD-10-CM | POA: Diagnosis not present

## 2021-06-23 DIAGNOSIS — J449 Chronic obstructive pulmonary disease, unspecified: Secondary | ICD-10-CM

## 2021-06-23 NOTE — Patient Instructions (Signed)
You seem to be doing better today.  Continue your medications.  Start Breztri once you get the medication in the mail.  We will see him in follow-up in 3 months time call sooner should any new problems arise.

## 2021-06-23 NOTE — Progress Notes (Signed)
Subjective:    Patient ID: [name], female    DOB: Feb 15, 1951, 70 y.o.   MRN: [id number] Chief Complaint  Patient presents with   Follow-up    Sob with exertion.     HPI [name] is a 70 year old former smoker who presents today for follow-up on dyspnea.  This is a scheduled visit.  She has mild COPD and morbid obesity with significant deconditioning.  Her dyspnea is out of proportion to her PFTs.  She was given a trial of Trelegy Ellipta during her last visit which helped her.  However, because of insurance issues and inability to afford the Trelegy she has been switched to [place] and medication assistance has been obtained through [place] for the patient.  We will start that today.  She has been following with cardiology after she started developing exertional dyspnea after her coronary artery bypass in December 2021.  These issues have now subsided.  She has not had paroxysmal nocturnal dyspnea.  No fevers, chills or sweats.  Cough or sputum production, no hemoptysis.  She had repeat sleep study on 06/02/2021 which showed mild OSA, improvement from prior.  No need for specific therapy mostly positional change.  She does have nocturnal hypoxemia related to obesity and is on supplemental oxygen.  She notes improvement in symptoms with this therapy.  Overall she feels that she is stable to mildly improved.  Review of Systems A 10 point review of systems was performed and it is as noted above otherwise negative.   Patient Active Problem List   Diagnosis Date Noted   Unilateral occipital headache 01/10/2021   Abdominal aortic ectasia (HCC) 10/09/2020   Back pain 09/27/2020   Gross hematuria 09/14/2020   S/P CABG x 1 08/03/2020   Abnormal cardiovascular stress test    Aortic atherosclerosis ([place]) 04/15/2020   COPD (chronic obstructive pulmonary disease) ([place]) 04/15/2020   Pill dysphagia 04/05/2020   Cervical neck pain with evidence of disc disease 10/23/2019   Left hip pain  10/23/2019   Right knee pain 10/23/2019   Osteoarthritis 10/23/2019   Anemia 10/23/2019   Weight loss, unintentional 10/05/2019   Hepatic steatosis 05/03/2019   Thrombocytosis 04/24/2019   Abdominal pain 04/24/2019   Migraine 04/19/2018   Obesity, morbid, BMI 40.0-49.9 ([place]) 08/17/2017   Palpitations 01/08/2017   Mass of right side of neck 07/10/2016   Neutrophilia 04/30/2015   Advanced care planning/counseling discussion 01/22/2015   Health maintenance examination 01/22/2015   Medicare annual wellness visit, subsequent 12/15/2013   Tachycardia 09/13/2012   Osteoarthritis of left knee 12/11/2011   Candidal intertrigo 05/10/2011   Hypertension    Bipolar disorder ([place])    Fibromyalgia    Hyperlipidemia associated with type 2 diabetes mellitus (HCC)    GERD (gastroesophageal reflux disease)    Hypothyroidism 04/05/2011   OSA (obstructive sleep apnea) 03/16/2011   CAD (coronary artery disease) 03/16/2011   Diabetes mellitus type 2, controlled, with complications ([place]) 47/42/5956   Dyspnea 01/03/2011   Social History   Tobacco Use   Smoking status: Former    Packs/day: 2.00    Years: 43.00    Pack years: 86.00    Types: Cigarettes    Quit date: 05/17/2010    Years since quitting: 11.1   Smokeless tobacco: Never   Tobacco comments:    QUIT 2011  Substance Use Topics   Alcohol use: No    Alcohol/week: 0.0 standard drinks   Allergies  Allergen Reactions   Crestor [Rosuvastatin] Other (See  Comments)    myalgias   Lipitor [Atorvastatin] Other (See Comments)    Mood swings   Lithium Other (See Comments)    abd pain, n/v, was hospitalized   Quetiapine Other (See Comments)    Muscle twitching, muscle spasm   Current Meds  Medication Sig   acetaminophen (TYLENOL) 650 MG CR tablet Take 1,300 mg by mouth 2 (two) times daily.   albuterol (VENTOLIN HFA) 108 (90 Base) MCG/ACT inhaler Inhale 2 puffs into the lungs every 6 (six) hours as needed for wheezing or shortness of  breath.   Apple Cider Vinegar 600 MG CAPS    aspirin 81 MG EC tablet Take 1 tablet (81 mg total) by mouth daily.   aspirin-acetaminophen-caffeine (EXCEDRIN MIGRAINE) 250-250-65 MG tablet Take by mouth every 6 (six) hours as needed for headache.   Boswellia-Glucosamine-Vit D (OSTEO BI-FLEX ONE PER DAY PO) Take 1 tablet by mouth daily.   cholecalciferol (VITAMIN D3) 25 MCG (1000 UNIT) tablet Take 1,000 Units by mouth daily.   cyclobenzaprine (FLEXERIL) 10 MG tablet TAKE 1/2 TO 1 TABLET BY MOUTH TWICE A DAY AS NEEDED FOR MUSCLE SPASMS AND MIGRAINE   famotidine (PEPCID) 20 MG tablet Take 20 mg by mouth every other day. Alternates with omeprazole   gabapentin (NEURONTIN) 100 MG capsule Take 1-3 capsules (100-300 mg total) by mouth at bedtime.   levothyroxine (SYNTHROID) 100 MCG tablet TAKE 1 TABLET DAILY, AND ON2 DAYS A WEEK, TAKE AN     EXTRA 1/2 TABLET   loratadine (CLARITIN) 10 MG tablet Take 1 tablet (10 mg total) by mouth daily.   Melatonin 5 MG CAPS Take by mouth.   metFORMIN (GLUCOPHAGE) 500 MG tablet TAKE 1 TABLET TWICE DAILY  WITH MEALS   Multiple Vitamin (MULTIVITAMIN WITH MINERALS) TABS tablet Take 1 tablet by mouth daily. One A Day for Women   naproxen sodium (ALEVE) 220 MG tablet Take 220 mg by mouth 2 (two) times daily with a meal.   nystatin cream (MYCOSTATIN) APPLY TO AFFECTED AREA TWICE A DAY   omeprazole (PRILOSEC) 20 MG capsule TAKE 1 DAILY-ALTERNATES WITH FAMOTIDINE (Patient taking differently: Take 20 mg by mouth every other day. Take 1 daily-Alternates with famotidine)   ondansetron (ZOFRAN ODT) 4 MG disintegrating tablet Take 1 tablet by mouth every 4- 6 hours as needed   ONE TOUCH LANCETS MISC Use to check sugar twice daily Dx: E11.8   ONETOUCH ULTRA test strip USE TO CHECK BLOOD SUGAR 2 TIMES   DAILY   OXYGEN Inhale 2 L into the lungs at bedtime.   Rhubarb (ESTROVEN COMPLETE PO) Take 1 tablet by mouth daily.   simvastatin (ZOCOR) 40 MG tablet TAKE 1 TABLET AT BEDTIME    torsemide (DEMADEX) 20 MG tablet Take 1 tablet (20 mg total) by mouth daily.   vitamin B-12 (CYANOCOBALAMIN) 1000 MCG tablet Take 1,000 mcg by mouth daily.   ziprasidone (GEODON) 40 MG capsule TAKE 1 CAPSULE EVERY       MORNING AND AT BEDTIME   She will start Judithann Sauger again today due to insurance coverage.  Immunization History  Administered Date(s) Administered   Fluad Quad(high Dose 65+) 04/24/2019, 05/26/2020   Influenza Split 05/09/2011, 06/11/2012   Influenza Whole 05/20/2010   Influenza, High Dose Seasonal PF 06/01/2021   Influenza,inj,Quad PF,6+ Mos 07/14/2013, 06/16/2014, 04/30/2015, 05/31/2016, 05/10/2017, 04/19/2018   Moderna SARS-COV2 Booster Vaccination 06/21/2020, 12/31/2020   Moderna Sars-Covid-2 Vaccination 10/18/2019, 11/15/2019   Pneumococcal Conjugate-13 11/14/2017, 04/19/2018   Pneumococcal Polysaccharide-23 04/18/2011, 06/01/2021  Td 04/18/2011   Zoster Recombinat (Shingrix) 11/14/2017, 01/02/2018   Zoster, Live 12/29/2013        Objective:   Physical Exam BP 130/70 (BP Location: Left Wrist, Cuff Size: Normal)   Pulse 91   Temp 97.9 F (36.6 C) (Temporal)   Ht 5\' 2"  (1.575 m)   Wt 236 lb 6.4 oz (107.2 kg)   SpO2 96%   BMI 43.24 kg/m   GENERAL: Obese woman, presents in transport chair.  No conversational dyspnea.  Looks older than stated age.  Looks very deconditioned.  She is conversant and jovial today. HEAD: Normocephalic, atraumatic. EYES: PERRL, anicteric sclera. MOUTH: Nose/mouth/throat not examined due to masking requirements for COVID 19. NECK: Supple. No thyromegaly. Trachea midline. No JVD.  No adenopathy.  Prominent dorsocervical fat pad posteriorly. PULMONARY: Good air entry bilaterally.  No adventitious sounds. CARDIOVASCULAR: S1 and S2. Regular rate and rhythm.  No rubs, murmurs or gallops heard.   ABDOMEN: Obese, nondistended. MUSCULOSKELETAL: No joint deformity, no clubbing, no edema.  Median sternotomy scar well-healed. NEUROLOGIC: No  overt focal deficit.  Speech is fluent. SKIN: Intact,warm,dry. PSYCH: Mood and behavior normal      Assessment & Plan:     ICD-10-CM   1. Stage 1 mild COPD by GOLD classification ([place])  J44.9    She will resume Breztri 2 puffs twice a day Expecting her medication today Continue albuterol as needed Follow-up 3 months time call sooner should any proble    2. Nocturnal hypoxemia due to obesity  E66.9    G47.36    Continue O2 supplementation nocturnally    3. Shortness of breath  R06.02    Out of proportion to her very mild defect on PFTs Mostly related to obesity, deconditioning, diastolic dysfunction     4. Morbid obesity ([place])  E66.01    Needs continued weight loss    5. OSA (obstructive sleep apnea)  G47.33    Repeat study shows marked improvement from prior No need for CPAP at present Managed nocturnal posture Continue supplemental O2    6. Physical deconditioning  R53.81    Needs conditioning program States unable to do due to right knee issues     Patient appears as compensated she can get at present.  She is to start Judithann Sauger today which was provided for her due to insurance issues.  We will see her in follow-up in 3 months time she is to contact us prior to that time should any new difficulties arise.   Renold Don, MD Advanced Bronchoscopy PCCM [facility]    *This note was dictated using voice recognition software/Dragon.  Despite best efforts to proofread, errors can occur which can change the meaning.  Any change was purely unintentional.

## 2021-06-24 ENCOUNTER — Telehealth: Payer: Self-pay

## 2021-06-24 ENCOUNTER — Other Ambulatory Visit: Payer: Self-pay

## 2021-06-24 ENCOUNTER — Encounter: Payer: Self-pay | Admitting: Family Medicine

## 2021-06-24 ENCOUNTER — Ambulatory Visit: Payer: Medicare HMO | Attending: Internal Medicine

## 2021-06-24 ENCOUNTER — Encounter: Payer: Self-pay | Admitting: Pulmonary Disease

## 2021-06-24 DIAGNOSIS — Z23 Encounter for immunization: Secondary | ICD-10-CM

## 2021-06-24 MED ORDER — MODERNA COVID-19 BIVAL BOOSTER 50 MCG/0.5ML IM SUSP
INTRAMUSCULAR | 0 refills | Status: DC
  Filled 2021-06-24: qty 0.5, 1d supply, fill #0

## 2021-06-24 NOTE — Progress Notes (Signed)
   Covid-19 Vaccination Clinic  Name:  [name]    MRN: [id number] DOB: 1951/03/22  06/24/2021  [name] was observed post Covid-19 immunization for 15 minutes without incident. She was provided with Vaccine Information Sheet and instruction to access the V-Safe system.   [name] was instructed to call 911 with any severe reactions post vaccine: Difficulty breathing  Swelling of face and throat  A fast heartbeat  A bad rash all over body  Dizziness and weakness   Immunizations Administered     Name Date Dose VIS Date Route   Moderna Covid-19 vaccine Bivalent Booster 06/24/2021 11:46 AM 0.5 mL 04/02/2021 Intramuscular   Manufacturer: Levan Hurst   Lot: [id number]   NDC: [place], PharmD, MBA Clinical Acute Care Pharmacist

## 2021-06-24 NOTE — Telephone Encounter (Signed)
Received faxed PA request for cyclobenzaprine 10 mg tab; key:  [id number], Rx: [id number].  Decision pending.

## 2021-06-24 NOTE — Telephone Encounter (Addendum)
Received faxed PA approval, valid 08/21/2020- 09/22/2021.  Made pharmacy aware.

## 2021-06-27 NOTE — Telephone Encounter (Signed)
Pt's chart had been updated.

## 2021-07-06 ENCOUNTER — Other Ambulatory Visit: Payer: Medicare HMO

## 2021-07-07 DIAGNOSIS — J449 Chronic obstructive pulmonary disease, unspecified: Secondary | ICD-10-CM | POA: Diagnosis not present

## 2021-07-11 ENCOUNTER — Other Ambulatory Visit: Payer: Self-pay | Admitting: Family Medicine

## 2021-07-11 DIAGNOSIS — Z794 Long term (current) use of insulin: Secondary | ICD-10-CM

## 2021-07-11 DIAGNOSIS — E118 Type 2 diabetes mellitus with unspecified complications: Secondary | ICD-10-CM

## 2021-07-12 NOTE — Telephone Encounter (Signed)
E-scribed refill 

## 2021-07-13 ENCOUNTER — Encounter: Payer: Medicare HMO | Admitting: Family Medicine

## 2021-07-19 ENCOUNTER — Ambulatory Visit: Payer: Medicare HMO | Admitting: Family Medicine

## 2021-08-05 ENCOUNTER — Telehealth: Payer: Self-pay

## 2021-08-05 NOTE — Chronic Care Management (AMB) (Signed)
Chronic Care Management Pharmacy Assistant   Name: [name]  MRN: [id number] DOB: 04/19/1951   Reason for Encounter: Reminder Call   Conditions to be addressed/monitored: CAD, HTN, HLD, COPD, and DMII   Medications: Outpatient Encounter Medications as of 08/05/2021  Medication Sig   acetaminophen (TYLENOL) 650 MG CR tablet Take 1,300 mg by mouth 2 (two) times daily.   albuterol (VENTOLIN HFA) 108 (90 Base) MCG/ACT inhaler Inhale 2 puffs into the lungs every 6 (six) hours as needed for wheezing or shortness of breath.   Apple Cider Vinegar 600 MG CAPS    aspirin 81 MG EC tablet Take 1 tablet (81 mg total) by mouth daily.   aspirin-acetaminophen-caffeine (EXCEDRIN MIGRAINE) 250-250-65 MG tablet Take by mouth every 6 (six) hours as needed for headache.   Boswellia-Glucosamine-Vit D (OSTEO BI-FLEX ONE PER DAY PO) Take 1 tablet by mouth daily.   Budeson-Glycopyrrol-Formoterol (BREZTRI AEROSPHERE) 160-9-4.8 MCG/ACT AERO Inhale 2 puffs into the lungs in the morning and at bedtime. (Patient not taking: Reported on 06/23/2021)   cholecalciferol (VITAMIN D3) 25 MCG (1000 UNIT) tablet Take 1,000 Units by mouth daily.   COVID-19 mRNA bivalent vaccine, Moderna, (MODERNA COVID-19 BIVAL BOOSTER) 50 MCG/0.5ML injection Inject into the muscle.   cyclobenzaprine (FLEXERIL) 10 MG tablet TAKE 1/2 TO 1 TABLET BY MOUTH TWICE A DAY AS NEEDED FOR MUSCLE SPASMS AND MIGRAINE   famotidine (PEPCID) 20 MG tablet Take 20 mg by mouth every other day. Alternates with omeprazole   gabapentin (NEURONTIN) 100 MG capsule Take 1-3 capsules (100-300 mg total) by mouth at bedtime.   isosorbide mononitrate (IMDUR) 30 MG 24 hr tablet Take 1 tablet (30 mg total) by mouth daily.   levothyroxine (SYNTHROID) 100 MCG tablet TAKE 1 TABLET DAILY, AND ON2 DAYS A WEEK, TAKE AN     EXTRA 1/2 TABLET   loratadine (CLARITIN) 10 MG tablet Take 1 tablet (10 mg total) by mouth daily.   Melatonin 5 MG CAPS Take by mouth.   metFORMIN  (GLUCOPHAGE) 500 MG tablet TAKE 1 TABLET TWICE DAILY  WITH MEALS   Multiple Vitamin (MULTIVITAMIN WITH MINERALS) TABS tablet Take 1 tablet by mouth daily. One A Day for Women   naproxen sodium (ALEVE) 220 MG tablet Take 220 mg by mouth 2 (two) times daily with a meal.   nystatin cream (MYCOSTATIN) APPLY TO AFFECTED AREA TWICE A DAY   omeprazole (PRILOSEC) 20 MG capsule TAKE 1 DAILY-ALTERNATES WITH FAMOTIDINE (Patient taking differently: Take 20 mg by mouth every other day. Take 1 daily-Alternates with famotidine)   ondansetron (ZOFRAN ODT) 4 MG disintegrating tablet Take 1 tablet by mouth every 4- 6 hours as needed   ONE TOUCH LANCETS MISC Use to check sugar twice daily Dx: E11.8   ONETOUCH ULTRA test strip USE TO CHECK BLOOD SUGAR 2 TIMES   DAILY   OXYGEN Inhale 2 L into the lungs at bedtime.   Rhubarb (ESTROVEN COMPLETE PO) Take 1 tablet by mouth daily.   simvastatin (ZOCOR) 40 MG tablet TAKE 1 TABLET AT BEDTIME   torsemide (DEMADEX) 20 MG tablet Take 1 tablet (20 mg total) by mouth daily.   vitamin B-12 (CYANOCOBALAMIN) 1000 MCG tablet Take 1,000 mcg by mouth daily.   ziprasidone (GEODON) 40 MG capsule TAKE 1 CAPSULE EVERY       MORNING AND AT BEDTIME   No facility-administered encounter medications on file as of 08/05/2021.   [name] was contacted to remind her of her upcoming telephone visit  with Charlene Brooke on 08/10/21 at 3:30pm. Patient was reminded to have all medications, supplements and any blood glucose and blood pressure readings available for review at appointment.   Are you having any problems with your medications? No  Do you have any concerns you like to discuss with the pharmacist? No  There patient states she just got her husband home from nursing facillity.    Star Rating Drugs: Medication:  Last Fill: Day Supply Simvastatin 40mg  06/14/21 90 Metformin 500mg  07/30/21 90 Actos 15mg   06/02/21 [place], CPP notified  Avel Sensor,  [place] Assistant [phone]  Total time spent for month CPA: 10 min.

## 2021-08-05 NOTE — Progress Notes (Signed)
Opened in Error.

## 2021-08-06 DIAGNOSIS — J449 Chronic obstructive pulmonary disease, unspecified: Secondary | ICD-10-CM | POA: Diagnosis not present

## 2021-08-10 ENCOUNTER — Other Ambulatory Visit: Payer: Self-pay

## 2021-08-10 ENCOUNTER — Telehealth: Payer: Self-pay | Admitting: Pharmacist

## 2021-08-10 ENCOUNTER — Ambulatory Visit (INDEPENDENT_AMBULATORY_CARE_PROVIDER_SITE_OTHER): Payer: Medicare HMO | Admitting: Pharmacist

## 2021-08-10 DIAGNOSIS — I2510 Atherosclerotic heart disease of native coronary artery without angina pectoris: Secondary | ICD-10-CM

## 2021-08-10 DIAGNOSIS — J449 Chronic obstructive pulmonary disease, unspecified: Secondary | ICD-10-CM

## 2021-08-10 DIAGNOSIS — I10 Essential (primary) hypertension: Secondary | ICD-10-CM

## 2021-08-10 DIAGNOSIS — F3178 Bipolar disorder, in full remission, most recent episode mixed: Secondary | ICD-10-CM

## 2021-08-10 DIAGNOSIS — E785 Hyperlipidemia, unspecified: Secondary | ICD-10-CM

## 2021-08-10 DIAGNOSIS — Z794 Long term (current) use of insulin: Secondary | ICD-10-CM

## 2021-08-10 DIAGNOSIS — G43909 Migraine, unspecified, not intractable, without status migrainosus: Secondary | ICD-10-CM

## 2021-08-10 MED ORDER — TRAMADOL HCL 50 MG PO TABS: 50.0000 mg | ORAL_TABLET | Freq: Two times a day (BID) | ORAL | 0 refills | Status: DC | PRN

## 2021-08-10 NOTE — Progress Notes (Signed)
Chronic Care Management Pharmacy Note  08/10/2021 Name:  [name] MRN:  [id number] DOB:  11/25/1950  Summary: -Pt reports home BG: fasting 120-150, bedtime 160-206; Last A1c 6.0% 03/2021 before Actos was discontinued in October. -Pt reports worsening back pain as she is main caregiver for her husband who is bed-bound after prolonged hospital-SNF stay; she requests refill for tramadol which she typically uses very infrequently (down to 2 pills, last Rx 12/02/20 #15) -Pt will most likely have to reschedule her physical w/ PCP in Jan because she cannot leave her husband home alone, there is no one to stay with him  Recommendations/Changes made from today's visit: -Recommend to increase metformin 500 mg to 2 tablets BID -Request tramadol refill from PCP  Plan: -[facility] will call patient 3 weeks for blood sugars -Pharmacist follow up televisit scheduled for 2 months   Subjective: [name] is an 70 y.o. year old female who is a primary patient of Ria Bush, MD.  The CCM team was consulted for assistance with disease management and care coordination needs.    Engaged with patient by telephone for follow up visit in response to provider referral for pharmacy case management and/or care coordination services.   Consent to Services:  The patient was given information about Chronic Care Management services, agreed to services, and gave verbal consent prior to initiation of services.  Please see initial visit note for detailed documentation.   Patient Care Team: Ria Bush, MD as PCP - General (Family Medicine) Kate Sable, MD as PCP - Cardiology (Cardiology) Bryson Ha, OD as Consulting Physician (Optometry) Charlton Haws, [facility] as Pharmacist (Pharmacist)  Patient is from [place], she has been in [facility] for over 20 years. Her daughter still lives in [place], 2 brothers in [place], 1 brother in [place], sister in [place]. Pt is on disability x 10 years. Her  husband is retired in his 56s. She is "not very mobile", gets out for doctors visits, grocery store. She uses walker to get around.   08/10/21: Pt's husband just got home from prolonged hospital/SNF stay and is bed-bound, patient is main caregiver for him and cannot leave him alone. She will struggle to get to appts for a while as there is no one else who can sit with him during the day.  Recent office visits: 03/30/21 Danise Mina (PCP) - Controlled type 2 diabetes mellitus with complication. Start Gabapentin for paresthesias D/c Topiramate.   01/10/21 Danise Mina (PCP) - Intractable migraine with status migrainosus. No med changes. Given Dexamethasone/ketorolac/promethazine injection in clinic. Ordered Nurtec.   12/27/20 Danise Mina (PCP) - Controlled type 2 diabetes mellitus with complication. Stop Novolin N due to low A1c, hypoglycemia.  Recent consult visits: 06/23/21 Dr Patsey Berthold (pulmonary): f/u COPD; resume Breztri. SOB mostly related to obesity, deconditioning, diastolic dysfunction. No need for CPAP.  04/21/21 Agbor-Etang (Cardiology) - F/U CAD. Chest discomfort improved with higher dose of Imdur. No med changes.   03/07/21 Ahern (Neurology) - Chronic migraine without aura, with intractable migraine. Start Ajovy 225 mg.   7/03/28/21 Agbor-Etang (Cardiology) - F/U CAD. Start Torsemide. Increase Isosorbide to 30 mg.  02/24/21 Patsey Berthold (Pulmonary) - Stage 1 mild COPD by GOLD classification. Start Trelegy. D/c Novolin & Anoro Ellipta. (Changed to [facility] 7/25 due to cost)   02/16/21 Volanda Napoleon (Pulmonary) - OSA. Night study done. No med changes.   01/14/21 Krasinski (Orthopedic) - Osteoarthritis of knee, unspecified. Triamcinolone injection. 12/07/20 Henrene Pastor (Physical Therapy) - Unilateral primary osteoarthritis, right knee. Visits every  2 wks.   Hospital visits: None in previous 6 months   Objective:  Lab Results  Component Value Date   CREATININE 0.93 03/04/2021   BUN 19 03/04/2021   GFR 86.14  09/27/2020   GFRNONAA >60 03/04/2021   GFRAA 80 08/26/2020   NA 137 03/04/2021   K 4.0 03/04/2021   CALCIUM 8.8 (L) 03/04/2021   CO2 22 03/04/2021   GLUCOSE 128 (H) 03/04/2021    Lab Results  Component Value Date/Time   HGBA1C 6.0 (A) 03/30/2021 09:58 AM   HGBA1C 5.6 12/27/2020 10:58 AM   HGBA1C 5.9 (H) 07/30/2020 12:00 PM   HGBA1C 6.4 05/21/2020 09:15 AM   GFR 86.14 09/27/2020 11:17 AM   GFR 70.60 09/14/2020 01:15 PM   MICROALBUR 2.4 (H) 12/27/2020 11:54 AM   MICROALBUR 5.8 (H) 01/15/2015 10:48 AM    Last diabetic Eye exam:  Lab Results  Component Value Date/Time   HMDIABEYEEXA No Retinopathy 07/08/2020 12:00 AM    Last diabetic Foot exam:  Lab Results  Component Value Date/Time   HMDIABFOOTEX done 02/25/2013 12:00 AM     Lab Results  Component Value Date   CHOL 131 05/21/2020   HDL 42.80 05/21/2020   LDLCALC 48 12/08/2013   LDLDIRECT 65.0 05/21/2020   TRIG 239.0 (H) 05/21/2020   CHOLHDL 3 05/21/2020    Hepatic Function Latest Ref Rng & Units 09/14/2020 08/02/2020 07/30/2020  Total Protein 6.0 - 8.3 g/dL 7.3 7.3 7.5  Albumin 3.5 - 5.2 g/dL 4.1 3.8 3.9  AST 0 - 37 U/L _0 ALT 0 - 35 U/L _1 Alk Phosphatase 39 - 117 U/L 65 40 41  Total Bilirubin 0.2 - 1.2 mg/dL 0.3 0.6 0.7  Bilirubin, Direct 0.0 - 0.3 mg/dL - - -    Lab Results  Component Value Date/Time   TSH 1.37 09/14/2020 01:15 PM   TSH 2.79 05/21/2020 09:15 AM   FREET4 0.94 04/10/2017 09:53 AM   FREET4 0.79 03/30/2016 08:52 AM    CBC Latest Ref Rng & Units 02/25/2021 10/27/2020 09/27/2020  WBC 4.0 - 10.5 K/uL 11.7(H) 17.9(H) 12.2(H)  Hemoglobin 12.0 - 15.0 g/dL 10.7(L) 10.8(L) 11.0(L)  Hematocrit 36.0 - 46.0 % 33.1(L) 33.5(L) 33.3(L)  Platelets 150 - 400 K/uL 427(H) 400 499(H)    No results found for: VD25OH  Clinical ASCVD: Yes  The 10-year ASCVD risk score (Arnett DK, et al., 2019) is: 17.8%   Values used to calculate the score:     Age: 79 years     Sex: Female     Is Non-Hispanic  African American: No     Diabetic: Yes     Tobacco smoker: No     Systolic Blood Pressure: 353 mmHg     Is BP treated: Yes     HDL Cholesterol: 42.8 mg/dL     Total Cholesterol: 131 mg/dL    Depression screen [facility] 2/9 11/23/2020 10/11/2020 05/26/2020  Decreased Interest 0 1 0  Down, Depressed, Hopeless 0 0 0  PHQ - 2 Score 0 1 0  Altered sleeping 0 1 0  Tired, decreased energy 0 1 0  Change in appetite 0 2 0  Feeling bad or failure about yourself  0 0 0  Trouble concentrating 0 0 0  Moving slowly or fidgety/restless 0 0 0  Suicidal thoughts 0 0 0  PHQ-9 Score 0 5 0  Difficult doing work/chores Not difficult at all Not difficult at all -  Some recent data might  be hidden    GAD 7 : Generalized Anxiety Score 05/26/2020  Nervous, Anxious, on Edge 0  Control/stop worrying 0  Worry too much - different things 0  Trouble relaxing 0  Restless 0  Easily annoyed or irritable 0  Afraid - awful might happen 0  Total GAD 7 Score 0     Social History   Tobacco Use  Smoking Status Former   Packs/day: 2.00   Years: 43.00   Pack years: 86.00   Types: Cigarettes   Quit date: 05/17/2010   Years since quitting: 11.2  Smokeless Tobacco Never  Tobacco Comments   QUIT 2011   BP Readings from Last 3 Encounters:  06/23/21 130/70  04/21/21 122/60  03/30/21 134/62   Pulse Readings from Last 3 Encounters:  06/23/21 91  04/21/21 91  03/30/21 100   Wt Readings from Last 3 Encounters:  06/23/21 236 lb 6.4 oz (107.2 kg)  04/21/21 236 lb (107 kg)  03/30/21 231 lb 1 oz (104.8 kg)   BMI Readings from Last 3 Encounters:  06/23/21 43.24 kg/m  04/21/21 43.16 kg/m  03/30/21 42.26 kg/m    Assessment/Interventions: Review of patient past medical history, allergies, medications, health status, including review of consultants reports, laboratory and other test data, was performed as part of comprehensive evaluation and provision of chronic care management services.   SDOH:  (Social  Determinants of Health) assessments and interventions performed: Yes  SDOH Screenings   Alcohol Screen: Low Risk    Last Alcohol Screening Score (AUDIT): 0  Depression (PHQ2-9): Low Risk    PHQ-2 Score: 0  Financial Resource Strain: Low Risk    Difficulty of Paying Living Expenses: Not hard at all  Food Insecurity: No Food Insecurity   Worried About Charity fundraiser in the Last Year: Never true   Ran Out of Food in the Last Year: Never true  Housing: Low Risk    Last Housing Risk Score: 0  Physical Activity: Inactive   Days of Exercise per Week: 0 days   Minutes of Exercise per Session: 0 min  Social Connections: Not on file  Stress: No Stress Concern Present   Feeling of Stress : Not at all  Tobacco Use: Medium Risk   Smoking Tobacco Use: Former   Smokeless Tobacco Use: Never   Passive Exposure: Not on file  Transportation Needs: No Transportation Needs   Lack of Transportation (Medical): No   Lack of Transportation (Non-Medical): No    CCM Care Plan  Allergies  Allergen Reactions   Crestor [Rosuvastatin] Other (See Comments)    myalgias   Lipitor [Atorvastatin] Other (See Comments)    Mood swings   Lithium Other (See Comments)    abd pain, n/v, was hospitalized   Quetiapine Other (See Comments)    Muscle twitching, muscle spasm    Medications Reviewed Today     Reviewed by Charlton Haws, [facility] (Pharmacist) on 08/10/21 at [place] List Status: <None>   Medication Order Taking? Sig Documenting Provider Last Dose Status Informant  acetaminophen (TYLENOL) 650 MG CR tablet [id number] Yes Take 1,300 mg by mouth 2 (two) times daily. [provider] Taking Active   albuterol (VENTOLIN HFA) 108 (90 Base) MCG/ACT inhaler [id number] Yes Inhale 2 puffs into the lungs every 6 (six) hours as needed for wheezing or shortness of breath. Tyler Pita, MD Taking Active   Apple Cider Vinegar 600 MG CAPS [id number] Yes  [provider] Taking Active  aspirin 81 MG EC tablet [id number] Yes Take 1 tablet (81 mg total) by mouth daily. Kate Sable, MD Taking Active   aspirin-acetaminophen-caffeine [facility] MIGRAINE) [phone] MG tablet [id number] Yes Take by mouth every 6 (six) hours as needed for headache. [provider] Taking Active   Boswellia-Glucosamine-Vit D (OSTEO BI-FLEX ONE PER DAY PO) [id number] Yes Take 1 tablet by mouth daily. [provider] Taking Active   Budeson-Glycopyrrol-Formoterol (BREZTRI AEROSPHERE) 160-9-4.8 MCG/ACT AERO [id number] Yes Inhale 2 puffs into the lungs in the morning and at bedtime. Tyler Pita, MD Taking Active   cholecalciferol (VITAMIN D3) 25 MCG (1000 UNIT) tablet [id number] Yes Take 1,000 Units by mouth daily. [provider] Taking Active   COVID-19 mRNA bivalent vaccine, Moderna, (MODERNA COVID-19 BIVAL BOOSTER) 50 MCG/0.5ML injection [id number] Yes Inject into the muscle. Carlyle Basques, MD Taking Active   cyclobenzaprine (FLEXERIL) 10 MG tablet [id number] Yes TAKE 1/2 TO 1 TABLET BY MOUTH TWICE A DAY AS NEEDED FOR MUSCLE SPASMS AND MIGRAINE Ria Bush, MD Taking Active   famotidine (PEPCID) 20 MG tablet [id number] Yes Take 20 mg by mouth every other day. Alternates with omeprazole [provider] Taking Active Self  gabapentin (NEURONTIN) 100 MG capsule [id number] Yes Take 1-3 capsules (100-300 mg total) by mouth at bedtime. Ria Bush, MD Taking Active   isosorbide mononitrate (IMDUR) 30 MG 24 hr tablet [id number]  Take 1 tablet (30 mg total) by mouth daily. Kate Sable, MD  Expired 04/26/21 2359   levothyroxine (SYNTHROID) 100 MCG tablet [id number] Yes TAKE 1 TABLET DAILY, AND ON2 DAYS A WEEK, TAKE AN     EXTRA 1/2 TABLET Ria Bush, MD Taking Active   loratadine (CLARITIN) 10 MG tablet [id number] Yes Take 1 tablet (10 mg total) by mouth daily. Ria Bush, MD Taking Active Self  Melatonin 5 MG CAPS [id number] Yes Take by mouth.  [provider] Taking Active   metFORMIN (GLUCOPHAGE) 500 MG tablet [id number] Yes TAKE 1 TABLET TWICE DAILY  WITH MEALS Ria Bush, MD Taking Active   Multiple Vitamin (MULTIVITAMIN WITH MINERALS) TABS tablet [id number] Yes Take 1 tablet by mouth daily. One A Day for Women [provider] Taking Active Self  naproxen sodium (ALEVE) 220 MG tablet [id number] Yes Take 220 mg by mouth 2 (two) times daily with a meal. [provider] Taking Active   nystatin cream (MYCOSTATIN) [id number] Yes APPLY TO AFFECTED AREA TWICE A DAY Theora Gianotti, NP Taking Active   omeprazole (PRILOSEC) 20 MG capsule [id number] Yes TAKE 1 DAILY-ALTERNATES WITH FAMOTIDINE  Patient taking differently: Take 20 mg by mouth every other day. Take 1 daily-Alternates with famotidine   Ria Bush, MD Taking Active   ondansetron (ZOFRAN ODT) 4 MG disintegrating tablet [id number] Yes Take 1 tablet by mouth every 4- 6 hours as needed Levin Erp, PA Taking Active   ONE [facility] LANCETS [place] [id number] Yes Use to check sugar twice daily Dx: E11.8 Ria Bush, MD Taking Active Self  [facility] ULTRA test strip [id number] Yes USE TO CHECK BLOOD SUGAR 2 TIMES   DAILY Ria Bush, MD Taking Active   OXYGEN [id number] Yes Inhale 2 L into the lungs at bedtime. [provider] Taking Active Self  Rhubarb (ESTROVEN COMPLETE PO) [id number] Yes Take 1 tablet by mouth daily. [provider] Taking Active Self  simvastatin (ZOCOR) 40 MG tablet [id number] Yes TAKE 1 TABLET AT BEDTIME Ria Bush, MD Taking Active   torsemide (DEMADEX) 20 MG tablet [id number] Yes Take 1 tablet (  20 mg total) by mouth daily. Kate Sable, MD Taking Active   traMADol Veatrice Bourbon) 50 MG tablet [id number] Yes Take 50 mg by mouth every 6 (six) hours as needed. [provider] Taking Active   vitamin B-12 (CYANOCOBALAMIN) 1000 MCG tablet [id number] Yes Take 1,000 mcg by mouth daily.  [provider] Taking Active   ziprasidone (GEODON) 40 MG capsule [id number] Yes TAKE 1 CAPSULE EVERY       MORNING AND AT BEDTIME Ria Bush, MD Taking Active             Patient Active Problem List   Diagnosis Date Noted   Unilateral occipital headache 01/10/2021   Abdominal aortic ectasia ([place]) 10/09/2020   Back pain 09/27/2020   Gross hematuria 09/14/2020   S/P CABG x 1 08/03/2020   Abnormal cardiovascular stress test    Aortic atherosclerosis ([place]) 04/15/2020   COPD (chronic obstructive pulmonary disease) ([place]) 04/15/2020   Pill dysphagia 04/05/2020   Cervical neck pain with evidence of disc disease 10/23/2019   Left hip pain 10/23/2019   Right knee pain 10/23/2019   Osteoarthritis 10/23/2019   Anemia 10/23/2019   Weight loss, unintentional 10/05/2019   Hepatic steatosis 05/03/2019   Thrombocytosis 04/24/2019   Abdominal pain 04/24/2019   Migraine 04/19/2018   Obesity, morbid, BMI 40.0-49.9 ([place]) 08/17/2017   Palpitations 01/08/2017   Mass of right side of neck 07/10/2016   Neutrophilia 04/30/2015   Advanced care planning/counseling discussion 01/22/2015   Health maintenance examination 01/22/2015   Medicare annual wellness visit, subsequent 12/15/2013   Tachycardia 09/13/2012   Osteoarthritis of left knee 12/11/2011   Candidal intertrigo 05/10/2011   Hypertension    Bipolar disorder ([place])    Fibromyalgia    Hyperlipidemia associated with type 2 diabetes mellitus ([place])    GERD (gastroesophageal reflux disease)    Hypothyroidism 04/05/2011   OSA (obstructive sleep apnea) 03/16/2011   CAD (coronary artery disease) 03/16/2011   Diabetes mellitus type 2, controlled, with complications ([place]) 73/22/0254   Dyspnea 01/03/2011    Immunization History  Administered Date(s) Administered   Fluad Quad(high Dose 65+) 04/24/2019, 05/26/2020   Influenza Split 05/09/2011, 06/11/2012   Influenza Whole 05/20/2010   Influenza, High Dose Seasonal PF 06/01/2021    Influenza,inj,Quad PF,6+ Mos 07/14/2013, 06/16/2014, 04/30/2015, 05/31/2016, 05/10/2017, 04/19/2018   Moderna Covid-19 Vaccine Bivalent Booster 1yr & up 06/24/2021   Moderna SARS-COV2 Booster Vaccination 06/21/2020, 12/31/2020   Moderna Sars-Covid-2 Vaccination 10/18/2019, 11/15/2019   Pneumococcal Conjugate-13 11/14/2017, 04/19/2018   Pneumococcal Polysaccharide-23 04/18/2011, 06/01/2021   Td 04/18/2011   Zoster Recombinat (Shingrix) 11/14/2017, 01/02/2018   Zoster, Live 12/29/2013    Conditions to be addressed/monitored:  Hypertension, Hyperlipidemia, Diabetes, Coronary Artery Disease, GERD, COPD, Hypothyroidism, Osteoarthritis, and Bipolar disorder  Care Plan : [place] Updates made by FCharlton Haws RSanta Rosasince 08/10/2021 12:00 AM     Problem: CHL AMB "PATIENT-SPECIFIC PROBLEM"      Long-Range Goal: Disease management   Start Date: 05/11/2021  Expected End Date: 05/11/2022  This Visit's Progress: On track  Recent Progress: On track  Priority: High  Note:   Current Barriers:  Unable to independently monitor therapeutic efficacy Suboptimal therapeutic regimen for diabetes  Pharmacist Clinical Goal(s):  Patient will achieve adherence to monitoring guidelines and medication adherence to achieve therapeutic efficacy adhere to plan to optimize therapeutic regimen for diabetes as evidenced by report of adherence to recommended medication management changes through collaboration with PharmD and provider.   Interventions: 1:1 collaboration with  Ria Bush, MD regarding development and update of comprehensive plan of care as evidenced by provider attestation and co-signature Inter-disciplinary care team collaboration (see longitudinal plan of care) Comprehensive medication review performed; medication list updated in electronic medical record  Hypertension (BP goal <130/80) -Controlled - BP is at goal home, on avg; pt reports marked improvement in  chest pain since increasing Imdur; she reports mild swelling in legs, improved since starting torsemide; she reports minimal improvement in SOB since starting torsemide -ECHO 05/2020 - LVEF 40% and Grade 1 diastolic dysfunction (prior to CABG) -Denies hypotensive/hypertensive symptoms -Current home readings: 120-140/40-50s (checking every day) -Current treatment: Isosorbide MN 30 mg daily Torsemide 20 mg daily -Educated on BP goals and benefits of medications for prevention of heart attack, stroke and kidney damage; Daily salt intake goal < 2300 mg; -Counseled that torsemide was added by cardiologist to help with SOB -Counseled to monitor BP at home daily -Recommended to continue current medication  Hyperlipidemia: (LDL goal < 70) -Controlled - LDL is at goal; pt endorses compliance with statin and denies issues -Hx CAD, CABG 2012, 07/2020; [place] 11/09/2020 with no significant ischemia, fixed apical defect. -Current treatment: Simvastatin 40 mg daily Aspirin 81 mg daily -Educated on Cholesterol goals; Benefits of statin for ASCVD risk reduction; -Recommended to continue current medication  Diabetes (A1c goal <7%) -Not ideally controlled - A1c was at goal in Aug 2022 before pioglitazone was discontinued in October; pt endorses compliance with metformin; she reports she has taken up to 1000 mg BID before and tolerated it well, it was reduced at some point when A1c was in the 5's -Current home glucose readings fasting glucose: 120, 130, 140, 172 post prandial glucose: 202, 199, 192, 206, 198, 140, 160 -Denies hypoglycemic/hyperglycemic symptoms -Current medications: Metformin 500 mg BID Testing supplies -Medications previously tried: Novolin N, glimepiride  -Current meal patterns:  dinner: pizza once a week, hamburgers (no buns), salad, spaghetti every 1-2 weeks, beefaroni  drinks: diet soda, water (4-5 bottles per day) -Current exercise: step exercises - 4000 steps/day  (limited d/t knees, SOB) -Discussed benefits of newer DM medications (GLP-1, SGLT-2 inhibitor); given cardiac history Ozempic would be a good option for secondary ASCVD prevention as well as weight loss; an SGLT2-I would also be beneficial for ASCVD risk reduction and swelling (Farxiga can be obtained free through AZ[employer]Me -pt is already approved for [employer]) -Recommend to increase metformin 500 mg to 2 tablets BID  COPD (Goal: control symptoms and prevent exacerbations) -Improved - Pt notes improvement since taking Breztri now that she gets it free through PAP -Gold Grade: Gold 1 (FEV1>80%) -Current COPD Classification:  A (low sx, <2 exacerbations/yr) -MMRC/CAT score: not on file -Pulmonary function testing: 04/20/20 - FEV1 84% predicted, FEV1/FVC 0.76 -Exacerbations requiring treatment in last 6 months: 0 -Current treatment  Breztri 160-9-4.8 mcg/act 2 puffs BID (PAP) Albuterol HFA prn Loratadine 10 mg daily Oxygen 2L HS -Medications previously tried: Trelegy ($$), Anoro, Symbciort  -Patient denies consistent use of maintenance inhaler -Frequency of rescue inhaler use: every other day -Counseled pt is enrolled in [employer]Me through 08/20/2022, at end of 2023 we can re-apply for following year -Recommend to continue current medication  Bipolar disorder (Goal: manage symptoms) -Controlled - Pt reports "ziprasidone is a life saver"; she never wants to change this med -Current treatment: Ziprasidone 40 mg BID -Medications previously tried/failed: quetiapine, diazepam -Connected with PCP for mental health support -Recommended to continue current medication  Migraines (Goal: prevent episodes) -Not ideally controlled - pt  stopped Ajovy months ago because it was not helping and it was very expensive -Pt has appt 11/29 but had to cancel due to husband's health issues -Current treatment  Cyclobenzaprine 10 mg PRN  Excedrin PRN (APAP/caffeine/ASA) -Medications previously tried: Nurtec, topamax,  Ajovy -Discussed that there are other medications available for migraine prevention that can be tried; pt to discuss her concerns with her neurologist  Pain (Goal: manage symptoms) -Not ideally controlled - pt reports gel injections in her knee were not helpful; she is meant to follow up with ortho for next steps but will have a hard time getting to any appts in immediate future due to husband's poor health -Pt requests refill for tramadol - she uses very seldom but back pain has been worse lately due to caring for her husband and sleeping on couch while he is in living room in hospital bed;  -osteoarthritis of knee, cervical pain, fibromyalgia -Current treatment  Cyclobenzaprine 10 mg PRN Gabapentin 100 mg 1-3 cap HS Naproxen - 1 tab BID Tylenol 650 mg - 2 tab BID Tramadol 50 mg PRN (last rx 12/03/20 #15) -Counseled on benefits/risks of increasing gabapentin, advised it is safe to try higher dose -Counseled on risks of chronic NSAID use -  advised her to use the lowest effective dose of NSAID to control her pain in order to reduce CV and kidney risks -Will request tramadol refill from PCP  Health Maintenance -Vaccine gaps: TDAP -Current therapy:  Ondansetron ODT 4 mg PRN Apple cider vinegar capsules Boswellia-glucosamide-vitamin D (osteo bi flex) Rhubarb (estroven complete) (hx hysterectomy) Multivitamin Vitamin D3  Vitamin B12 1000 mg daily +Calcium 750 mg daily  Melatonin 5 mg daily -Patient is satisfied with current therapy and denies issues -Recommended to continue current medication  Patient Goals/Self-Care Activities Patient will:  - take medications as prescribed focus on medication adherence by pill box -check glucose twice a day, document, and provide at future appointments -check blood pressure daily, document, and provide at future appointments -Reduce naproxen to 1 tablet daily, up to 2 daily if needed -Contact neurologist regarding migraine medication -Increase  metformin 500 mg to 2 tablets twice a day     Medication Assistance:  Breztri - AZ[employer]Me approved through 08/20/22  Compliance/Adherence/Medication fill history: Care Gaps: Eye exam (due 07/08/21)  Star-Rating Drugs: Simvastatin - LF 06/14/21 x 90 ds; PDC 100% Metformin - LF 07/30/21 X 90 DS; [place] 96%  Patient's preferred pharmacy [facility]:  [id number] [place] [id number]: [phone]: [phone] [facility] [address] [address] [facility] [address]Phone: [phone]: [address] [address] [place]Phone: [phone]: [phone]  Uses pill box? Yes - 3 weeks at a time Pt endorses 100% compliance  We discussed: Current pharmacy is preferred with insurance plan and patient is satisfied with pharmacy services  Patient decided to: Continue current medication management strategy  Care Plan and Follow Up Patient Decision:  Patient agrees to Care Plan and Follow-up.  Plan: Telephone follow up appointment with care management team member scheduled for:  2 months  LCharlene Brooke PharmD, BMemorial HospitalClinical Pharmacist LCharlston Area Medical CenterPrimary Care [phone]

## 2021-08-10 NOTE — Telephone Encounter (Signed)
Patient reports back pain is worse since she is taking care of her bed-ridden husband and sleeping on the couch.  She requests a refill for tramadol 50 mg - she typically uses this very infrequently, last Rx from PCP is from 12/03/20 for 15 tablets and she has 1 left now.  Forwarding request to PCP.

## 2021-08-10 NOTE — Telephone Encounter (Signed)
ERx 

## 2021-08-10 NOTE — Patient Instructions (Signed)
Visit Information  Phone number for Pharmacist: [phone]   Goals Addressed             This Visit's Progress    Manage My Medicine       Timeframe:  Long-Range Goal Priority:  High Start Date:     05/11/21                         Expected End Date:  05/11/22                     Follow Up Date Jan 2023   - call for medicine refill 2 or 3 days before it runs out - call if I am sick and can't take my medicine - keep a list of all the medicines I take; vitamins and herbals too - use a pillbox to sort medicine  -Reduce naproxen to 1 tablet daily, up to 2 daily if needed -Contact neurologist regarding migraine medication -Increase metformin 500 mg to 2 tablets twice a day   Why is this important?   These steps will help you keep on track with your medicines.   Notes:         Care Plan : [place]  Updates made by Charlton Haws, [place] since 08/10/2021 12:00 AM     Problem: [facility] AMB "PATIENT-SPECIFIC PROBLEM"      Long-Range Goal: Disease management   Start Date: 05/11/2021  Expected End Date: 05/11/2022  This Visit's Progress: On track  Recent Progress: On track  Priority: High  Note:   Current Barriers:  Unable to independently monitor therapeutic efficacy Suboptimal therapeutic regimen for diabetes  Pharmacist Clinical Goal(s):  Patient will achieve adherence to monitoring guidelines and medication adherence to achieve therapeutic efficacy adhere to plan to optimize therapeutic regimen for diabetes as evidenced by report of adherence to recommended medication management changes through collaboration with PharmD and provider.   Interventions: 1:1 collaboration with Ria Bush, MD regarding development and update of comprehensive plan of care as evidenced by provider attestation and co-signature Inter-disciplinary care team collaboration (see longitudinal plan of care) Comprehensive medication review performed; medication list updated in  electronic medical record  Hypertension (BP goal <130/80) -Controlled - BP is at goal home, on avg; pt reports marked improvement in chest pain since increasing Imdur; she reports mild swelling in legs, improved since starting torsemide; she reports minimal improvement in SOB since starting torsemide -ECHO 05/2020 - LVEF 40% and Grade 1 diastolic dysfunction (prior to CABG) -Denies hypotensive/hypertensive symptoms -Current home readings: 120-140/40-50s (checking every day) -Current treatment: Isosorbide MN 30 mg daily Torsemide 20 mg daily -Educated on BP goals and benefits of medications for prevention of heart attack, stroke and kidney damage; Daily salt intake goal < 2300 mg; -Counseled that torsemide was added by cardiologist to help with SOB -Counseled to monitor BP at home daily -Recommended to continue current medication  Hyperlipidemia: (LDL goal < 70) -Controlled - LDL is at goal; pt endorses compliance with statin and denies issues -Hx CAD, CABG 2012, 07/2020; [place] 11/09/2020 with no significant ischemia, fixed apical defect. -Current treatment: Simvastatin 40 mg daily Aspirin 81 mg daily -Educated on Cholesterol goals; Benefits of statin for ASCVD risk reduction; -Recommended to continue current medication  Diabetes (A1c goal <7%) -Not ideally controlled - A1c was at goal in Aug 2022 before pioglitazone was discontinued in October; pt endorses compliance with metformin; she reports she has taken up  to 1000 mg BID before and tolerated it well, it was reduced at some point when A1c was in the 5's -Current home glucose readings fasting glucose: 120, 130, 140, 172 post prandial glucose: 202, 199, 192, 206, 198, 140, 160 -Denies hypoglycemic/hyperglycemic symptoms -Current medications: Metformin 500 mg BID Testing supplies -Medications previously tried: Novolin N, glimepiride  -Current meal patterns:  dinner: pizza once a week, hamburgers (no buns), salad,  spaghetti every 1-2 weeks, beefaroni  drinks: diet soda, water (4-5 bottles per day) -Current exercise: step exercises - 4000 steps/day (limited d/t knees, SOB) -Discussed benefits of newer DM medications (GLP-1, SGLT-2 inhibitor); given cardiac history Ozempic would be a good option for secondary ASCVD prevention as well as weight loss; an SGLT2-I would also be beneficial for ASCVD risk reduction and swelling (Farxiga can be obtained free through [employer] -pt is already approved for [employer]) -Recommend to increase metformin 500 mg to 2 tablets BID  COPD (Goal: control symptoms and prevent exacerbations) -Improved - Pt notes improvement since taking Breztri now that she gets it free through PAP -Gold Grade: Gold 1 (FEV1>80%) -Current COPD Classification:  A (low sx, <2 exacerbations/yr) -MMRC/CAT score: not on file -Pulmonary function testing: 04/20/20 - FEV1 84% predicted, FEV1/FVC 0.76 -Exacerbations requiring treatment in last 6 months: 0 -Current treatment  Breztri 160-9-4.8 mcg/act 2 puffs BID (PAP) Albuterol HFA prn Loratadine 10 mg daily Oxygen 2L HS -Medications previously tried: Trelegy ($$), Anoro, Symbciort  -Patient denies consistent use of maintenance inhaler -Frequency of rescue inhaler use: every other day -Counseled pt is enrolled in [employer] through 08/20/2022, at end of 2023 we can re-apply for following year -Recommend to continue current medication  Bipolar disorder (Goal: manage symptoms) -Controlled - Pt reports "ziprasidone is a life saver"; she never wants to change this med -Current treatment: Ziprasidone 40 mg BID -Medications previously tried/failed: quetiapine, diazepam -Connected with PCP for mental health support -Recommended to continue current medication  Migraines (Goal: prevent episodes) -Not ideally controlled - pt stopped Ajovy months ago because it was not helping and it was very expensive -Pt has appt 11/29 but had to cancel due to husband's health  issues -Current treatment  Cyclobenzaprine 10 mg PRN  Excedrin PRN (APAP/caffeine/ASA) -Medications previously tried: Nurtec, topamax, Ajovy -Discussed that there are other medications available for migraine prevention that can be tried; pt to discuss her concerns with her neurologist  Pain (Goal: manage symptoms) -Not ideally controlled - pt reports gel injections in her knee were not helpful; she is meant to follow up with ortho for next steps but will have a hard time getting to any appts in immediate future due to husband's poor health -Pt requests refill for tramadol - she uses very seldom but back pain has been worse lately due to caring for her husband and sleeping on couch while he is in living room in hospital bed;  -osteoarthritis of knee, cervical pain, fibromyalgia -Current treatment  Cyclobenzaprine 10 mg PRN Gabapentin 100 mg 1-3 cap HS Naproxen - 1 tab BID Tylenol 650 mg - 2 tab BID Tramadol 50 mg PRN (last rx 12/03/20 #15) -Counseled on benefits/risks of increasing gabapentin, advised it is safe to try higher dose -Counseled on risks of chronic NSAID use -  advised her to use the lowest effective dose of NSAID to control her pain in order to reduce CV and kidney risks -Will request tramadol refill from PCP  Health Maintenance -Vaccine gaps: TDAP -Current therapy:  Ondansetron ODT 4 mg PRN Apple cider  vinegar capsules Boswellia-glucosamide-vitamin D (osteo bi flex) Rhubarb (estroven complete) (hx hysterectomy) Multivitamin Vitamin D3  Vitamin B12 1000 mg daily +Calcium 750 mg daily  Melatonin 5 mg daily -Patient is satisfied with current therapy and denies issues -Recommended to continue current medication  Patient Goals/Self-Care Activities Patient will:  - take medications as prescribed focus on medication adherence by pill box -check glucose twice a day, document, and provide at future appointments -check blood pressure daily, document, and provide at future  appointments -Reduce naproxen to 1 tablet daily, up to 2 daily if needed -Contact neurologist regarding migraine medication -Increase metformin 500 mg to 2 tablets twice a day       Patient verbalizes understanding of instructions provided today and agrees to view in [place].  Telephone follow up appointment with pharmacy team member scheduled for: 2 months  Charlene Brooke, PharmD, [facility] Clinical Pharmacist [place] [facility] at [facility] [phone]

## 2021-08-11 NOTE — Telephone Encounter (Signed)
Spoke with pt notifying her rx was sent in.  Expresses her thanks.

## 2021-08-20 DIAGNOSIS — E785 Hyperlipidemia, unspecified: Secondary | ICD-10-CM | POA: Diagnosis not present

## 2021-08-20 DIAGNOSIS — I10 Essential (primary) hypertension: Secondary | ICD-10-CM | POA: Diagnosis not present

## 2021-08-20 DIAGNOSIS — E1159 Type 2 diabetes mellitus with other circulatory complications: Secondary | ICD-10-CM | POA: Diagnosis not present

## 2021-08-20 DIAGNOSIS — J449 Chronic obstructive pulmonary disease, unspecified: Secondary | ICD-10-CM | POA: Diagnosis not present

## 2021-08-20 DIAGNOSIS — Z7984 Long term (current) use of oral hypoglycemic drugs: Secondary | ICD-10-CM | POA: Diagnosis not present

## 2021-08-20 DIAGNOSIS — I2510 Atherosclerotic heart disease of native coronary artery without angina pectoris: Secondary | ICD-10-CM | POA: Diagnosis not present

## 2021-08-20 DIAGNOSIS — F3178 Bipolar disorder, in full remission, most recent episode mixed: Secondary | ICD-10-CM

## 2021-08-20 DIAGNOSIS — Z794 Long term (current) use of insulin: Secondary | ICD-10-CM | POA: Diagnosis not present

## 2021-08-20 DIAGNOSIS — R69 Illness, unspecified: Secondary | ICD-10-CM | POA: Diagnosis not present

## 2021-08-22 ENCOUNTER — Other Ambulatory Visit: Payer: Self-pay | Admitting: Family Medicine

## 2021-08-22 NOTE — Progress Notes (Signed)
CPE cancelled at this time.

## 2021-08-25 ENCOUNTER — Other Ambulatory Visit: Payer: Medicare HMO

## 2021-08-29 ENCOUNTER — Telehealth: Payer: Self-pay

## 2021-08-29 NOTE — Progress Notes (Addendum)
Chronic Care Management Pharmacy Assistant   Name: [name]  MRN: [id number] DOB: 08/24/1950  Reason for Encounter: CCM (Diabetes Disease State)   Recent office visits:  None since last CCM contact  Recent consult visits:  None since last CCM contact  Hospital visits:  None in previous 6 months  Medications: Outpatient Encounter Medications as of 08/29/2021  Medication Sig   acetaminophen (TYLENOL) 650 MG CR tablet Take 1,300 mg by mouth 2 (two) times daily.   albuterol (VENTOLIN HFA) 108 (90 Base) MCG/ACT inhaler Inhale 2 puffs into the lungs every 6 (six) hours as needed for wheezing or shortness of breath.   Apple Cider Vinegar 600 MG CAPS    aspirin 81 MG EC tablet Take 1 tablet (81 mg total) by mouth daily.   aspirin-acetaminophen-caffeine (EXCEDRIN MIGRAINE) 250-250-65 MG tablet Take by mouth every 6 (six) hours as needed for headache.   Boswellia-Glucosamine-Vit D (OSTEO BI-FLEX ONE PER DAY PO) Take 1 tablet by mouth daily.   Budeson-Glycopyrrol-Formoterol (BREZTRI AEROSPHERE) 160-9-4.8 MCG/ACT AERO Inhale 2 puffs into the lungs in the morning and at bedtime.   cholecalciferol (VITAMIN D3) 25 MCG (1000 UNIT) tablet Take 1,000 Units by mouth daily.   COVID-19 mRNA bivalent vaccine, Moderna, (MODERNA COVID-19 BIVAL BOOSTER) 50 MCG/0.5ML injection Inject into the muscle.   cyclobenzaprine (FLEXERIL) 10 MG tablet TAKE 1/2 TO 1 TABLET BY MOUTH TWICE A DAY AS NEEDED FOR MUSCLE SPASMS AND MIGRAINE   famotidine (PEPCID) 20 MG tablet Take 20 mg by mouth every other day. Alternates with omeprazole   gabapentin (NEURONTIN) 100 MG capsule Take 1-3 capsules (100-300 mg total) by mouth at bedtime.   isosorbide mononitrate (IMDUR) 30 MG 24 hr tablet Take 1 tablet (30 mg total) by mouth daily.   levothyroxine (SYNTHROID) 100 MCG tablet TAKE 1 TABLET DAILY, AND ON2 DAYS A WEEK, TAKE AN     EXTRA 1/2 TABLET   loratadine (CLARITIN) 10 MG tablet Take 1 tablet (10 mg total) by mouth  daily.   Melatonin 5 MG CAPS Take by mouth.   metFORMIN (GLUCOPHAGE) 500 MG tablet TAKE 1 TABLET TWICE DAILY  WITH MEALS   Multiple Vitamin (MULTIVITAMIN WITH MINERALS) TABS tablet Take 1 tablet by mouth daily. One A Day for Women   naproxen sodium (ALEVE) 220 MG tablet Take 220 mg by mouth 2 (two) times daily with a meal.   nystatin cream (MYCOSTATIN) APPLY TO AFFECTED AREA TWICE A DAY   omeprazole (PRILOSEC) 20 MG capsule TAKE 1 DAILY-ALTERNATES WITH FAMOTIDINE (Patient taking differently: Take 20 mg by mouth every other day. Take 1 daily-Alternates with famotidine)   ondansetron (ZOFRAN ODT) 4 MG disintegrating tablet Take 1 tablet by mouth every 4- 6 hours as needed   ONE TOUCH LANCETS MISC Use to check sugar twice daily Dx: E11.8   ONETOUCH ULTRA test strip USE TO CHECK BLOOD SUGAR 2 TIMES   DAILY   OXYGEN Inhale 2 L into the lungs at bedtime.   Rhubarb (ESTROVEN COMPLETE PO) Take 1 tablet by mouth daily.   simvastatin (ZOCOR) 40 MG tablet TAKE 1 TABLET AT BEDTIME   torsemide (DEMADEX) 20 MG tablet Take 1 tablet (20 mg total) by mouth daily.   traMADol (ULTRAM) 50 MG tablet Take 1 tablet (50 mg total) by mouth 2 (two) times daily as needed for moderate pain.   vitamin B-12 (CYANOCOBALAMIN) 1000 MCG tablet Take 1,000 mcg by mouth daily.   ziprasidone (GEODON) 40 MG capsule TAKE 1 CAPSULE EVERY  MORNING AND AT BEDTIME   No facility-administered encounter medications on file as of 08/29/2021.   Recent Relevant Labs: Lab Results  Component Value Date/Time   HGBA1C 6.0 (A) 03/30/2021 09:58 AM   HGBA1C 5.6 12/27/2020 10:58 AM   HGBA1C 5.9 (H) 07/30/2020 12:00 PM   HGBA1C 6.4 05/21/2020 09:15 AM   MICROALBUR 2.4 (H) 12/27/2020 11:54 AM   MICROALBUR 5.8 (H) 01/15/2015 10:48 AM    Kidney Function Lab Results  Component Value Date/Time   CREATININE 0.93 03/04/2021 10:44 AM   CREATININE 0.66 10/27/2020 09:55 AM   CREATININE 0.93 07/30/2015 04:52 PM   CREATININE 0.75 08/30/2012 06:04  AM   CREATININE 0.69 08/29/2012 06:27 AM   GFR 86.14 09/27/2020 11:17 AM   GFRNONAA >60 03/04/2021 10:44 AM   GFRNONAA >60 08/30/2012 06:04 AM   GFRAA 80 08/26/2020 02:39 PM   GFRAA >60 08/30/2012 06:04 AM   Contacted patient on 08/29/2021 to discuss diabetes disease state.   Current antihyperglycemic regimen:  Metformin 500 mg BID   Patient verbally confirms she is taking the above medications as directed. Yes - 2 tablets twice a day per [facility] request. Patient confirmed she is taking a total of 4 tablets per day (2 tablets in morning before breakfast and 2 tablets after dinner).   What diet changes have been made to improve diabetes control? Patient states she eats what she normally would; pasta, chicken, salad, cheeseburger, hot dogs, potato salad, cole slaw, pizza and Mac N Cheese.   What recent interventions/DTPs have been made to improve glycemic control:  Recommended to increase Metformin 500 mg to 2 tablets BID  Have there been any recent hospitalizations or ED visits since last visit with CPP? No  Patient denies hypoglycemic symptoms, including Pale, Sweaty, Shaky, Hungry, Nervous/irritable, and Vision changes  Patient denies hyperglycemic symptoms, including blurry vision, excessive thirst, fatigue, polyuria, and weakness  How often are you checking your blood sugar? twice daily Morning readings are fasting.  Date  Time  Blood Sugar  Time  Blood Sugar 01/09  AM  121   ---------  ---------------  01/08  AM  185   PM  118 01/07  AM  154   PM  130 01/06  AM  138   PM  155 01/05  AM  151   PM  123 01/04  AM  131   PM  205 - Had [employer] for dinner 01/03  AM  198   PM  159 01/02  AM  146   PM  140  01/01  AM  148   PM  101  During the week, how often does your blood glucose drop below 70? Never  Are you checking your feet daily/regularly? Yes  Adherence Review: Is the patient currently on a STATIN medication? Yes Is the patient currently on ACE/ARB  medication? No Does the patient have >5 day gap between last estimated fill dates? No  Care Gaps: Annual wellness visit in last year? Yes 11/23/2020 Most recent A1C reading: 6.0 on 03/30/2021 Most Recent BP reading: 130/70 on 06/23/2021  Last eye exam / retinopathy screening: 07/08/2020 Last diabetic foot exam: Up to date  Star Rating Drugs:  Medication:  Last Fill: Day Supply Simvastatin 40 mg 06/14/2021 90 Metformin 500 mg 07/30/2021 90  PCP appointment on 11/24/2021 for [place], CPP notified  Marijean Niemann, [place] [phone]  Time Spent: 75 Minutes    PharmD Addendum: -Fasting BG are somewhat elevated,  PM BG look great. Will defer med changes until we can get a new A1c checked. Pt just cancelled her physical this month due to inability to leave her husband home alone. Will follow up in 1 month with patient over the phone.  Charlene Brooke, PharmD, BCACP 09/06/21 4:29 PM

## 2021-09-06 DIAGNOSIS — J449 Chronic obstructive pulmonary disease, unspecified: Secondary | ICD-10-CM | POA: Diagnosis not present

## 2021-09-07 ENCOUNTER — Encounter: Payer: Medicare HMO | Admitting: Family Medicine

## 2021-09-10 ENCOUNTER — Encounter: Payer: Self-pay | Admitting: Family Medicine

## 2021-09-12 MED ORDER — METFORMIN HCL 500 MG PO TABS: 500.0000 mg | ORAL_TABLET | Freq: Two times a day (BID) | ORAL | 0 refills | Status: DC

## 2021-09-12 NOTE — Telephone Encounter (Signed)
E-scribed refill.  Plz schedule lab and cpe visits after 11/24/21.

## 2021-09-12 NOTE — Telephone Encounter (Signed)
Pt stated she will call us back to make appointment but right now she is taking care of her husband

## 2021-09-21 ENCOUNTER — Encounter: Payer: Self-pay | Admitting: Family Medicine

## 2021-09-21 ENCOUNTER — Other Ambulatory Visit: Payer: Self-pay | Admitting: Family Medicine

## 2021-09-21 MED ORDER — OMEPRAZOLE 20 MG PO CPDR: 20.0000 mg | DELAYED_RELEASE_CAPSULE | ORAL | 0 refills | Status: DC

## 2021-09-21 NOTE — Telephone Encounter (Signed)
E-scribed refill.  Plz schedule lab and cpe visits.  

## 2021-09-23 ENCOUNTER — Telehealth: Payer: Self-pay | Admitting: Acute Care

## 2021-09-23 ENCOUNTER — Ambulatory Visit: Payer: Medicare HMO | Admitting: Pulmonary Disease

## 2021-09-23 NOTE — Telephone Encounter (Signed)
Spoke with pt to set up f/u lung screening CT scan. Pt is taking care of a sick family member and I asked that I call her back in a few weeks.

## 2021-09-28 NOTE — Telephone Encounter (Signed)
Message from pharmacy:  The directions are conflicting on the original prescription for Omeprazole 20mg  dated 09/21/2021. Please respond with intended directions or comment to pharmacy.

## 2021-09-29 NOTE — Telephone Encounter (Signed)
ERx 

## 2021-09-30 ENCOUNTER — Telehealth: Payer: Self-pay | Admitting: Family Medicine

## 2021-09-30 NOTE — Telephone Encounter (Signed)
[facility] called they are needing direction on the omeprazole (PRILOSEC) 20 MG capsule Please call [phone] ref# [id number]

## 2021-10-03 ENCOUNTER — Telehealth: Payer: Self-pay

## 2021-10-03 NOTE — Progress Notes (Signed)
Chronic Care Management Pharmacy Assistant   Name: [name]  MRN: [id number] DOB: Jun 22, 1951  Reason for Encounter: CCM (Appointment Reminder)  Medications: Outpatient Encounter Medications as of 10/03/2021  Medication Sig   acetaminophen (TYLENOL) 650 MG CR tablet Take 1,300 mg by mouth 2 (two) times daily.   albuterol (VENTOLIN HFA) 108 (90 Base) MCG/ACT inhaler Inhale 2 puffs into the lungs every 6 (six) hours as needed for wheezing or shortness of breath.   Apple Cider Vinegar 600 MG CAPS    aspirin 81 MG EC tablet Take 1 tablet (81 mg total) by mouth daily.   aspirin-acetaminophen-caffeine (EXCEDRIN MIGRAINE) 250-250-65 MG tablet Take by mouth every 6 (six) hours as needed for headache.   Boswellia-Glucosamine-Vit D (OSTEO BI-FLEX ONE PER DAY PO) Take 1 tablet by mouth daily.   Budeson-Glycopyrrol-Formoterol (BREZTRI AEROSPHERE) 160-9-4.8 MCG/ACT AERO Inhale 2 puffs into the lungs in the morning and at bedtime.   cholecalciferol (VITAMIN D3) 25 MCG (1000 UNIT) tablet Take 1,000 Units by mouth daily.   COVID-19 mRNA bivalent vaccine, Moderna, (MODERNA COVID-19 BIVAL BOOSTER) 50 MCG/0.5ML injection Inject into the muscle.   cyclobenzaprine (FLEXERIL) 10 MG tablet TAKE 1/2 TO 1 TABLET BY MOUTH TWICE A DAY AS NEEDED FOR MUSCLE SPASMS AND MIGRAINE   famotidine (PEPCID) 20 MG tablet Take 20 mg by mouth every other day. Alternates with omeprazole   gabapentin (NEURONTIN) 100 MG capsule Take 1-3 capsules (100-300 mg total) by mouth at bedtime.   isosorbide mononitrate (IMDUR) 30 MG 24 hr tablet Take 1 tablet (30 mg total) by mouth daily.   levothyroxine (SYNTHROID) 100 MCG tablet TAKE 1 TABLET DAILY, AND ON2 DAYS A WEEK, TAKE AN     EXTRA 1/2 TABLET   loratadine (CLARITIN) 10 MG tablet Take 1 tablet (10 mg total) by mouth daily.   Melatonin 5 MG CAPS Take by mouth.   metFORMIN (GLUCOPHAGE) 500 MG tablet Take 1 tablet (500 mg total) by mouth 2 (two) times daily with a meal.    Multiple Vitamin (MULTIVITAMIN WITH MINERALS) TABS tablet Take 1 tablet by mouth daily. One A Day for Women   naproxen sodium (ALEVE) 220 MG tablet Take 220 mg by mouth 2 (two) times daily with a meal.   nystatin cream (MYCOSTATIN) APPLY TO AFFECTED AREA TWICE A DAY   omeprazole (PRILOSEC) 20 MG capsule Take 1 capsule (20 mg total) by mouth daily. Take 1 daily-Alternates with famotidine   ondansetron (ZOFRAN ODT) 4 MG disintegrating tablet Take 1 tablet by mouth every 4- 6 hours as needed   ONE TOUCH LANCETS MISC Use to check sugar twice daily Dx: E11.8   ONETOUCH ULTRA test strip USE TO CHECK BLOOD SUGAR 2 TIMES   DAILY   OXYGEN Inhale 2 L into the lungs at bedtime.   Rhubarb (ESTROVEN COMPLETE PO) Take 1 tablet by mouth daily.   simvastatin (ZOCOR) 40 MG tablet TAKE 1 TABLET AT BEDTIME   torsemide (DEMADEX) 20 MG tablet Take 1 tablet (20 mg total) by mouth daily.   traMADol (ULTRAM) 50 MG tablet Take 1 tablet (50 mg total) by mouth 2 (two) times daily as needed for moderate pain.   vitamin B-12 (CYANOCOBALAMIN) 1000 MCG tablet Take 1,000 mcg by mouth daily.   ziprasidone (GEODON) 40 MG capsule TAKE 1 CAPSULE EVERY       MORNING AND AT BEDTIME   No facility-administered encounter medications on file as of 10/03/2021.   [name] was contacted to remind of  upcoming telephone visit with Charlene Brooke on 10/06/2021 at 3:30 pm. Patient was reminded to have all medications, supplements and any blood glucose and blood pressure readings available for review at appointment. If unable to reach, a voicemail was left for patient.   Are you having any problems with your medications? No   Do you have any concerns you like to discuss with the pharmacist? No  Star Rating Drugs: Medication:  Last Fill: Day Supply Simvastatin 40 mg       06/15/2021     90 Metformin 500 mg       09/27/2021      90 Fill dates verified with [place], CPP notified  Marijean Niemann,  [place]  Time Spent: 10 Minutes

## 2021-10-05 NOTE — Telephone Encounter (Signed)
After speaking to patient [name] at [employer] was advised that patient takes Prilosec every other day alternating with famotidine.

## 2021-10-06 ENCOUNTER — Ambulatory Visit (INDEPENDENT_AMBULATORY_CARE_PROVIDER_SITE_OTHER): Payer: Medicare HMO | Admitting: Pharmacist

## 2021-10-06 ENCOUNTER — Other Ambulatory Visit: Payer: Self-pay

## 2021-10-06 DIAGNOSIS — J449 Chronic obstructive pulmonary disease, unspecified: Secondary | ICD-10-CM

## 2021-10-06 DIAGNOSIS — I2510 Atherosclerotic heart disease of native coronary artery without angina pectoris: Secondary | ICD-10-CM

## 2021-10-06 DIAGNOSIS — E1169 Type 2 diabetes mellitus with other specified complication: Secondary | ICD-10-CM

## 2021-10-06 DIAGNOSIS — F3178 Bipolar disorder, in full remission, most recent episode mixed: Secondary | ICD-10-CM

## 2021-10-06 DIAGNOSIS — E785 Hyperlipidemia, unspecified: Secondary | ICD-10-CM

## 2021-10-06 DIAGNOSIS — I10 Essential (primary) hypertension: Secondary | ICD-10-CM

## 2021-10-06 DIAGNOSIS — G43909 Migraine, unspecified, not intractable, without status migrainosus: Secondary | ICD-10-CM

## 2021-10-06 DIAGNOSIS — E118 Type 2 diabetes mellitus with unspecified complications: Secondary | ICD-10-CM

## 2021-10-06 NOTE — Progress Notes (Signed)
Chronic Care Management Pharmacy Note  10/10/2021 Name:  [name] MRN:  [id number] DOB:  [birth year]  Summary: CCM F/U visit -Pt has increased metformin to 2000 mg/day and tolerating well; reports home BG: fasting 121-180, bedtime 128-169; Last A1c 6.0% 03/2021 before Actos was discontinued in October.  Recommendations/Changes made from today's visit: -Updated Metformin Rx. -Recommend repeat A1c to determine if med changes are needed - scheduled CPE for March. If A1c > 7% would recommend adding Farxiga 10 mg daily (pt is already enrolled in AZ&Me for Breztri)  Plan: -[place] will call patient 3 weeks for blood sugars -Pharmacist follow up televisit scheduled for 2 months -Cardiology f/u appt 10/20/21 -PCP CPE scheduled 11/11/21   Subjective: [name] is an 71 y.o. year old female who is a primary patient of Ria Bush, MD.  The CCM team was consulted for assistance with disease management and care coordination needs.    Engaged with patient by telephone for follow up visit in response to provider referral for pharmacy case management and/or care coordination services.   Consent to Services:  The patient was given information about Chronic Care Management services, agreed to services, and gave verbal consent prior to initiation of services.  Please see initial visit note for detailed documentation.   Patient Care Team: Ria Bush, MD as PCP - General (Family Medicine) Kate Sable, MD as PCP - Cardiology (Cardiology) Bryson Ha, OD as Consulting Physician (Optometry) Charlton Haws, [facility] as Pharmacist (Pharmacist)  Patient is from [place], she has been in [place] for over 20 years. Her daughter still lives in [place], 2 brothers in [place], 1 brother in [place], sister in [place]. Pt is on disability x 10 years. Her husband is retired in his 79s. She is "not very mobile", gets out for doctors visits, grocery store. She uses walker to get around.    08/10/21: Pt's husband just got home from prolonged hospital/SNF stay and is bed-bound, patient is main caregiver for him and cannot leave him alone. She will struggle to get to appts for a while as there is no one else who can sit with him during the day.  Recent office visits: 03/30/21 Danise Mina (PCP) - Controlled type 2 diabetes mellitus with complication. Start Gabapentin for paresthesias D/c Topiramate.   01/10/21 Danise Mina (PCP) - Intractable migraine with status migrainosus. No med changes. Given Dexamethasone/ketorolac/promethazine injection in clinic. Ordered Nurtec.   12/27/20 Danise Mina (PCP) - Controlled type 2 diabetes mellitus with complication. Stop Novolin N due to low A1c, hypoglycemia.  Recent consult visits: 06/23/21 Dr Patsey Berthold (pulmonary): f/u COPD; resume Breztri. SOB mostly related to obesity, deconditioning, diastolic dysfunction. No need for CPAP.  04/21/21 Agbor-Etang (Cardiology) - F/U CAD. Chest discomfort improved with higher dose of Imdur. No med changes.   03/07/21 Ahern (Neurology) - Chronic migraine without aura, with intractable migraine. Start Ajovy 225 mg.   7/03/28/21 Agbor-Etang (Cardiology) - F/U CAD. Start Torsemide. Increase Isosorbide to 30 mg.  02/24/21 Patsey Berthold (Pulmonary) - Stage 1 mild COPD by GOLD classification. Start Trelegy. D/c Novolin & Anoro Ellipta. (Changed to [facility] 7/25 due to cost)   02/16/21 Volanda Napoleon (Pulmonary) - OSA. Night study done. No med changes.   01/14/21 Krasinski (Orthopedic) - Osteoarthritis of knee, unspecified. Triamcinolone injection. 12/07/20 Henrene Pastor (Physical Therapy) - Unilateral primary osteoarthritis, right knee. Visits every 2 wks.   Hospital visits: None in previous 6 months   Objective:  Lab Results  Component Value Date   CREATININE 0.93  03/04/2021   BUN 19 03/04/2021   GFR 86.14 09/27/2020   GFRNONAA >60 03/04/2021   GFRAA 80 08/26/2020   NA 137 03/04/2021   K 4.0 03/04/2021   CALCIUM 8.8 (L) 03/04/2021   CO2  22 03/04/2021   GLUCOSE 128 (H) 03/04/2021    Lab Results  Component Value Date/Time   HGBA1C 6.0 (A) 03/30/2021 09:58 AM   HGBA1C 5.6 12/27/2020 10:58 AM   HGBA1C 5.9 (H) 07/30/2020 12:00 PM   HGBA1C 6.4 05/21/2020 09:15 AM   GFR 86.14 09/27/2020 11:17 AM   GFR 70.60 09/14/2020 01:15 PM   MICROALBUR 2.4 (H) 12/27/2020 11:54 AM   MICROALBUR 5.8 (H) 01/15/2015 10:48 AM    Last diabetic Eye exam:  Lab Results  Component Value Date/Time   HMDIABEYEEXA No Retinopathy 07/08/2020 12:00 AM    Last diabetic Foot exam:  Lab Results  Component Value Date/Time   HMDIABFOOTEX done 02/25/2013 12:00 AM     Lab Results  Component Value Date   CHOL 131 05/21/2020   HDL 42.80 05/21/2020   LDLCALC 48 12/08/2013   LDLDIRECT 65.0 05/21/2020   TRIG 239.0 (H) 05/21/2020   CHOLHDL 3 05/21/2020    Hepatic Function Latest Ref Rng & Units 09/14/2020 08/02/2020 07/30/2020  Total Protein 6.0 - 8.3 g/dL 7.3 7.3 7.5  Albumin 3.5 - 5.2 g/dL 4.1 3.8 3.9  AST 0 - 37 U/L _0 ALT 0 - 35 U/L _1 Alk Phosphatase 39 - 117 U/L 65 40 41  Total Bilirubin 0.2 - 1.2 mg/dL 0.3 0.6 0.7  Bilirubin, Direct 0.0 - 0.3 mg/dL - - -    Lab Results  Component Value Date/Time   TSH 1.37 09/14/2020 01:15 PM   TSH 2.79 05/21/2020 09:15 AM   FREET4 0.94 04/10/2017 09:53 AM   FREET4 0.79 03/30/2016 08:52 AM    CBC Latest Ref Rng & Units 02/25/2021 10/27/2020 09/27/2020  WBC 4.0 - 10.5 K/uL 11.7(H) 17.9(H) 12.2(H)  Hemoglobin 12.0 - 15.0 g/dL 10.7(L) 10.8(L) 11.0(L)  Hematocrit 36.0 - 46.0 % 33.1(L) 33.5(L) 33.3(L)  Platelets 150 - 400 K/uL 427(H) 400 499(H)    No results found for: VD25OH  Clinical ASCVD: Yes  The 10-year ASCVD risk score (Arnett DK, et al., 2019) is: 19.8%   Values used to calculate the score:     Age: 71 years     Sex: Female     Is Non-Hispanic African American: No     Diabetic: Yes     Tobacco smoker: No     Systolic Blood Pressure: 875 mmHg     Is BP treated: Yes     HDL  Cholesterol: 42.8 mg/dL     Total Cholesterol: 131 mg/dL    Depression screen [facility] 2/9 11/23/2020 10/11/2020 05/26/2020  Decreased Interest 0 1 0  Down, Depressed, Hopeless 0 0 0  PHQ - 2 Score 0 1 0  Altered sleeping 0 1 0  Tired, decreased energy 0 1 0  Change in appetite 0 2 0  Feeling bad or failure about yourself  0 0 0  Trouble concentrating 0 0 0  Moving slowly or fidgety/restless 0 0 0  Suicidal thoughts 0 0 0  PHQ-9 Score 0 5 0  Difficult doing work/chores Not difficult at all Not difficult at all -  Some recent data might be hidden    GAD 7 : Generalized Anxiety Score 05/26/2020  Nervous, Anxious, on Edge 0  Control/stop worrying 0  Worry too  much - different things 0  Trouble relaxing 0  Restless 0  Easily annoyed or irritable 0  Afraid - awful might happen 0  Total GAD 7 Score 0     Social History   Tobacco Use  Smoking Status Former   Packs/day: 2.00   Years: 43.00   Pack years: 86.00   Types: Cigarettes   Quit date: 05/17/2010   Years since quitting: 11.4  Smokeless Tobacco Never  Tobacco Comments   QUIT 2011   BP Readings from Last 3 Encounters:  06/23/21 130/70  04/21/21 122/60  03/30/21 134/62   Pulse Readings from Last 3 Encounters:  06/23/21 91  04/21/21 91  03/30/21 100   Wt Readings from Last 3 Encounters:  06/23/21 236 lb 6.4 oz (107.2 kg)  04/21/21 236 lb (107 kg)  03/30/21 231 lb 1 oz (104.8 kg)   BMI Readings from Last 3 Encounters:  06/23/21 43.24 kg/m  04/21/21 43.16 kg/m  03/30/21 42.26 kg/m    Assessment/Interventions: Review of patient past medical history, allergies, medications, health status, including review of consultants reports, laboratory and other test data, was performed as part of comprehensive evaluation and provision of chronic care management services.   SDOH:  (Social Determinants of Health) assessments and interventions performed: Yes  SDOH Screenings   Alcohol Screen: Low Risk    Last Alcohol Screening  Score (AUDIT): 0  Depression (PHQ2-9): Low Risk    PHQ-2 Score: 0  Financial Resource Strain: Low Risk    Difficulty of Paying Living Expenses: Not hard at all  Food Insecurity: No Food Insecurity   Worried About Charity fundraiser in the Last Year: Never true   Ran Out of Food in the Last Year: Never true  Housing: Low Risk    Last Housing Risk Score: 0  Physical Activity: Inactive   Days of Exercise per Week: 0 days   Minutes of Exercise per Session: 0 min  Social Connections: Not on file  Stress: No Stress Concern Present   Feeling of Stress : Not at all  Tobacco Use: Medium Risk   Smoking Tobacco Use: Former   Smokeless Tobacco Use: Never   Passive Exposure: Not on file  Transportation Needs: No Transportation Needs   Lack of Transportation (Medical): No   Lack of Transportation (Non-Medical): No    CCM Care Plan  Allergies  Allergen Reactions   Crestor [Rosuvastatin] Other (See Comments)    myalgias   Lipitor [Atorvastatin] Other (See Comments)    Mood swings   Lithium Other (See Comments)    abd pain, n/v, was hospitalized   Quetiapine Other (See Comments)    Muscle twitching, muscle spasm    Medications Reviewed Today     Reviewed by Charlton Haws, [facility] (Pharmacist) on 10/10/21 at [phone]  Med List Status: <None>   Medication Order Taking? Sig Documenting Provider Last Dose Status Informant  acetaminophen (TYLENOL) 650 MG CR tablet [id number] Yes Take 1,300 mg by mouth 2 (two) times daily. [provider] Taking Active   albuterol (VENTOLIN HFA) 108 (90 Base) MCG/ACT inhaler [id number] Yes Inhale 2 puffs into the lungs every 6 (six) hours as needed for wheezing or shortness of breath. Tyler Pita, MD Taking Active   Apple Cider Vinegar 600 MG CAPS [id number] Yes  [provider] Taking Active   aspirin 81 MG EC tablet [id number] Yes Take 1 tablet (81 mg total) by mouth daily. Kate Sable, MD Taking Active    aspirin-acetaminophen-caffeine [facility]  MIGRAINE) 250-250-65 MG tablet [id number] Yes Take by mouth every 6 (six) hours as needed for headache. [provider] Taking Active   Boswellia-Glucosamine-Vit D (OSTEO BI-FLEX ONE PER DAY PO) [id number] Yes Take 1 tablet by mouth daily. [provider] Taking Active   Budeson-Glycopyrrol-Formoterol (BREZTRI AEROSPHERE) 160-9-4.8 MCG/ACT AERO [id number] Yes Inhale 2 puffs into the lungs in the morning and at bedtime. Tyler Pita, MD Taking Active            Med Note Luna Glasgow Oct 10, 2021  9:38 AM) AZ&ME pt assistance  cholecalciferol (VITAMIN D3) 25 MCG (1000 UNIT) tablet [id number] Yes Take 1,000 Units by mouth daily. [provider] Taking Active   cyclobenzaprine (FLEXERIL) 10 MG tablet [id number] Yes TAKE 1/2 TO 1 TABLET BY MOUTH TWICE A DAY AS NEEDED FOR MUSCLE SPASMS AND MIGRAINE Ria Bush, MD Taking Active   famotidine (PEPCID) 20 MG tablet [id number] Yes Take 20 mg by mouth every other day. Alternates with omeprazole [provider] Taking Active Self  gabapentin (NEURONTIN) 100 MG capsule [id number] Yes Take 1-3 capsules (100-300 mg total) by mouth at bedtime. Ria Bush, MD Taking Active   isosorbide mononitrate (IMDUR) 30 MG 24 hr tablet [id number]  Take 1 tablet (30 mg total) by mouth daily. Kate Sable, MD  Expired 04/26/21 2359   levothyroxine (SYNTHROID) 100 MCG tablet [id number] Yes TAKE 1 TABLET DAILY, AND ON2 DAYS A WEEK, TAKE AN     EXTRA 1/2 TABLET Ria Bush, MD Taking Active   loratadine (CLARITIN) 10 MG tablet [id number] Yes Take 1 tablet (10 mg total) by mouth daily. Ria Bush, MD Taking Active Self  Melatonin 5 MG CAPS [id number] Yes Take by mouth. [provider] Taking Active   metFORMIN (GLUCOPHAGE) 500 MG tablet [id number] Yes Take 1 tablet (500 mg total) by mouth 2 (two) times daily with a meal.  Patient taking differently: Take 1,000  mg by mouth 2 (two) times daily with a meal.   Ria Bush, MD Taking Active   Multiple Vitamin (MULTIVITAMIN WITH MINERALS) TABS tablet [id number] Yes Take 1 tablet by mouth daily. One A Day for Women [provider] Taking Active Self  naproxen sodium (ALEVE) 220 MG tablet [id number] Yes Take 220 mg by mouth 2 (two) times daily with a meal. [provider] Taking Active   nystatin cream (MYCOSTATIN) [id number] Yes APPLY TO AFFECTED AREA TWICE A DAY Theora Gianotti, NP Taking Active   omeprazole (PRILOSEC) 20 MG capsule [id number] Yes Take 1 capsule (20 mg total) by mouth daily. Take 1 daily-Alternates with famotidine Ria Bush, MD Taking Active   ONE [facility] LANCETS [place] [id number] Yes Use to check sugar twice daily Dx: E11.8 Ria Bush, MD Taking Active Self  [facility] ULTRA test strip [id number] Yes USE TO CHECK BLOOD SUGAR 2 TIMES   DAILY Ria Bush, MD Taking Active   OXYGEN [id number] Yes Inhale 2 L into the lungs at bedtime. [provider] Taking Active Self  Rhubarb (ESTROVEN COMPLETE PO) [id number] Yes Take 1 tablet by mouth daily. [provider] Taking Active Self  simvastatin (ZOCOR) 40 MG tablet [id number] Yes TAKE 1 TABLET AT BEDTIME Ria Bush, MD Taking Active   torsemide (DEMADEX) 20 MG tablet [id number] Yes Take 1 tablet (20 mg total) by mouth daily. Kate Sable, MD Taking Active   traMADol Veatrice Bourbon) 50 MG tablet [id number] Yes Take 1 tablet (50 mg total) by mouth 2 (two) times daily as needed for moderate  pain. Ria Bush, MD Taking Active   vitamin B-12 (CYANOCOBALAMIN) 1000 MCG tablet [id number] Yes Take 1,000 mcg by mouth daily. [provider] Taking Active   ziprasidone (GEODON) 40 MG capsule [id number] Yes TAKE 1 CAPSULE EVERY       MORNING AND AT BEDTIME Ria Bush, MD Taking Active             Patient Active Problem List   Diagnosis Date Noted   Unilateral occipital  headache 01/10/2021   Abdominal aortic ectasia ([place]) 10/09/2020   Back pain 09/27/2020   Gross hematuria 09/14/2020   S/P CABG x 1 08/03/2020   Abnormal cardiovascular stress test    Aortic atherosclerosis ([place]) 04/15/2020   COPD (chronic obstructive pulmonary disease) ([place]) 04/15/2020   Pill dysphagia 04/05/2020   Cervical neck pain with evidence of disc disease 10/23/2019   Left hip pain 10/23/2019   Right knee pain 10/23/2019   Osteoarthritis 10/23/2019   Anemia 10/23/2019   Weight loss, unintentional 10/05/2019   Hepatic steatosis 05/03/2019   Thrombocytosis 04/24/2019   Abdominal pain 04/24/2019   Migraine 04/19/2018   Obesity, morbid, BMI 40.0-49.9 ([place]) 08/17/2017   Palpitations 01/08/2017   Mass of right side of neck 07/10/2016   Neutrophilia 04/30/2015   Advanced care planning/counseling discussion 01/22/2015   Health maintenance examination 01/22/2015   Medicare annual wellness visit, subsequent 12/15/2013   Tachycardia 09/13/2012   Osteoarthritis of left knee 12/11/2011   Candidal intertrigo 05/10/2011   Hypertension    Bipolar disorder ([place])    Fibromyalgia    Hyperlipidemia associated with type 2 diabetes mellitus ([place])    GERD (gastroesophageal reflux disease)    Hypothyroidism 04/05/2011   OSA (obstructive sleep apnea) 03/16/2011   CAD (coronary artery disease) 03/16/2011   Diabetes mellitus type 2, controlled, with complications ([place]) 86/57/8469   Dyspnea 01/03/2011    Immunization History  Administered Date(s) Administered   Fluad Quad(high Dose 65+) 04/24/2019, 05/26/2020   Influenza Split 05/09/2011, 06/11/2012   Influenza Whole 05/20/2010   Influenza, High Dose Seasonal PF 06/01/2021   Influenza,inj,Quad PF,6+ Mos 07/14/2013, 06/16/2014, 04/30/2015, 05/31/2016, 05/10/2017, 04/19/2018   Moderna Covid-19 Vaccine Bivalent Booster 75yr & up 06/24/2021   Moderna SARS-COV2 Booster Vaccination 06/21/2020, 12/31/2020   Moderna Sars-Covid-2 Vaccination  10/18/2019, 11/15/2019   Pneumococcal Conjugate-13 11/14/2017, 04/19/2018   Pneumococcal Polysaccharide-23 04/18/2011, 06/01/2021   Td 04/18/2011   Zoster Recombinat (Shingrix) 11/14/2017, 01/02/2018   Zoster, Live 12/29/2013    Conditions to be addressed/monitored:  Hypertension, Hyperlipidemia, Diabetes, Coronary Artery Disease, COPD, Osteoarthritis, and Bipolar disorder  Care Plan : CCM Pharmacy Care Plan  Updates made by FCharlton Haws RPlainssince 10/10/2021 12:00 AM     Problem: Hypertension, Hyperlipidemia, Diabetes, Coronary Artery Disease, COPD, Osteoarthritis, and Bipolar disorder   Priority: High     Long-Range Goal: Disease management   Start Date: 05/11/2021  Expected End Date: 05/11/2022  This Visit's Progress: On track  Recent Progress: On track  Priority: High  Note:   Current Barriers:  Unable to independently monitor therapeutic efficacy Suboptimal therapeutic regimen for diabetes  Pharmacist Clinical Goal(s):  Patient will achieve adherence to monitoring guidelines and medication adherence to achieve therapeutic efficacy adhere to plan to optimize therapeutic regimen for diabetes as evidenced by report of adherence to recommended medication management changes through collaboration with PharmD and provider.   Interventions: 1:1 collaboration with GRia Bush MD regarding development and update of comprehensive plan of care as evidenced by provider attestation and co-signature Inter-disciplinary care team  collaboration (see longitudinal plan of care) Comprehensive medication review performed; medication list updated in electronic medical record  Hypertension (BP goal <130/80) -Controlled - BP is at goal home, on avg; pt reports marked improvement in chest pain since increasing Imdur; she reports mild swelling in legs, improved since starting torsemide; she reports minimal improvement in SOB since starting torsemide -ECHO 05/2020 - LVEF 40% and Grade 1  diastolic dysfunction (prior to CABG) -Denies hypotensive/hypertensive symptoms -Current home readings: 120-140/40-50s (checking every day) -Current treatment: Isosorbide [place] 30 mg daily - Appropriate, Effective, Safe, Accessible Torsemide 20 mg daily - Appropriate, Effective, Safe, Accessible -Educated on BP goals and benefits of medications for prevention of heart attack, stroke and kidney damage; Daily salt intake goal < 2300 mg; -Counseled that torsemide was added by cardiologist to help with SOB -Counseled to monitor BP at home daily -Recommended to continue current medication  Hyperlipidemia: (LDL goal < 70) -Controlled - LDL is at goal; pt endorses compliance with statin and denies issues -Hx CAD, CABG 2012, 07/2020; [place] 11/09/2020 with no significant ischemia, fixed apical defect. -Current treatment: Simvastatin 40 mg daily - Appropriate, Effective, Safe, Accessible Aspirin 81 mg daily -Appropriate, Effective, Safe, Accessible -Educated on Cholesterol goals; Benefits of statin for ASCVD risk reduction; -Recommended to continue current medication  Diabetes (A1c goal <7%) -Not ideally controlled - A1c was at goal in Aug 2022 before pioglitazone was discontinued in October; pt endorses compliance with metformin; she appears to be experiencing some level of dawn phenomenon as fasting BG are higher than bedtime BG -Current home glucose readings fasting glucose: 151 (pizza last night), 165 (shrimp/broccoli), 180 (hotdogs/coleslaw), 155, 121 post prandial glucose: 137, 161, 164, 159, 169, 128, 169 -Denies hypoglycemic/hyperglycemic symptoms -Current medications: Metformin 500 mg -2 tab BID -Appropriate, Query Effective, Safe, Accessible Testing supplies -Medications previously tried: Novolin N, glimepiride, pioglitazone -Current meal patterns:  dinner: pizza once a week, hamburgers (no buns), salad, spaghetti every 1-2 weeks, beefaroni  drinks: diet soda, water (4-5  bottles per day) -Current exercise: step exercises - 4000 steps/day (limited d/t knees, SOB) -Discussed benefits of newer DM medications (GLP-1, SGLT-2 inhibitor); given cardiac history Ozempic would be a good option for secondary ASCVD prevention as well as weight loss; an SGLT2-I would also be beneficial for ASCVD risk reduction and swelling (Farxiga can be obtained free through AZ&Me -pt is already approved for [employer]) -Recommend checking A1c prior to med changes - scheduled CPE for March; if A1c is > 7% would recommend adding Farxiga 10 mg daily  COPD (Goal: control symptoms and prevent exacerbations) -Controlled - Pt notes improvement since taking Breztri now that she gets it free through PAP -Gold Grade: Gold 1 (FEV1>80%) -Current COPD Classification:  A (low sx, <2 exacerbations/yr) -MMRC/CAT score: not on file -Pulmonary function testing: 04/20/20 - FEV1 84% predicted, FEV1/FVC 0.76 -Exacerbations requiring treatment in last 6 months: 0 -Current treatment  Breztri 160-9-4.8 mcg/act 2 puffs BID (PAP) -Appropriate, Effective, Safe, Accessible Albuterol HFA prn - Appropriate, Effective, Safe, Accessible Oxygen 2L HS -Medications previously tried: Trelegy ($$), Anoro, Symbciort  -Patient denies consistent use of maintenance inhaler -Frequency of rescue inhaler use: every other day -Counseled pt is enrolled in AZ&Me through 08/20/2022, at end of 2023 we can re-apply for following year -Recommend to continue current medication  Bipolar disorder (Goal: manage symptoms) -Controlled - Pt reports "ziprasidone is a life saver"; she never wants to change this med -Current treatment: Ziprasidone 40 mg BID -Appropriate, Effective, Safe, Accessible -Medications previously tried/failed:  quetiapine, diazepam -Connected with PCP for mental health support -Recommended to continue current medication  Migraines (Goal: prevent episodes) -Not ideally controlled - pt stopped Ajovy months ago because  it was not helping and it was very expensive -Pt has appt 07/19/21 but had to cancel due to husband's health issues -Current treatment  Cyclobenzaprine 10 mg PRN -Appropriate, Query Effective Excedrin PRN (APAP/caffeine/ASA) - Appropriate, Query Effective -Medications previously tried: Nurtec, topamax, Ajovy -Discussed that there are other medications available for migraine prevention that can be tried; pt to discuss her concerns with her neurologist  Pain (Goal: manage symptoms) -Not ideally controlled - pt reports gel injections in her knee were not helpful; she is meant to follow up with ortho for next steps but will have a hard time getting to any appts in immediate future due to husband's poor health -Pt requests refill for tramadol - she uses very seldom but back pain has been worse lately due to caring for her husband and sleeping on couch while he is in living room in hospital bed;  -osteoarthritis of knee, cervical pain, fibromyalgia -Current treatment  Cyclobenzaprine 10 mg PRN -Appropriate, Query Effective Gabapentin 100 mg 1-3 cap HS -Appropriate, Query Effective Naproxen - 1 tab BID -Appropriate, Query Effective Tylenol 650 mg - 2 tab BID -Appropriate, Query Effective Tramadol 50 mg PRN (last rx 08/11/21) -Appropriate, Query Effective -Counseled on benefits/risks of increasing gabapentin, advised it is safe to try higher dose -Counseled on risks of chronic NSAID use -  advised her to use the lowest effective dose of NSAID to control her pain in order to reduce CV and kidney risks  Health Maintenance -Vaccine gaps: TDAP  Patient Goals/Self-Care Activities Patient will:  - take medications as prescribed focus on medication adherence by pill box -check glucose twice a day, document, and provide at future appointments -check blood pressure daily, document, and provide at future appointments -Reduce naproxen to 1 tablet daily, up to 2 daily if needed -Contact neurologist  regarding migraine medication -Contact orthopedist for knee issues     Medication Assistance:  Breztri - AZ&Me approved through 08/20/22  Compliance/Adherence/Medication fill history: Care Gaps: Eye exam (due 07/08/21)  Star-Rating Drugs: Simvastatin - LF 06/14/21 x 90 ds; [place] 100% Metformin - LF 07/30/21 X 90 DS; [place] 96%  Patient's preferred pharmacy is:  [id number] [place] [id number]: [phone]: [phone] [facility] [address] [facility] [address] [id number]: [phone]: [address] [address] [address]Phone: [phone]: [phone]  Uses pill box? Yes - 3 weeks at a time Pt endorses 100% compliance  We discussed: Current pharmacy is preferred with insurance plan and patient is satisfied with pharmacy services  Patient decided to: Continue current medication management strategy  Care Plan and Follow Up Patient Decision:  Patient agrees to Care Plan and Follow-up.  Plan: Telephone follow up appointment with care management team member scheduled for:  2 months  LCharlene Brooke PharmD, BAgh Laveen LLCClinical [facility] [phone]

## 2021-10-07 DIAGNOSIS — J449 Chronic obstructive pulmonary disease, unspecified: Secondary | ICD-10-CM | POA: Diagnosis not present

## 2021-10-10 MED ORDER — METFORMIN HCL 500 MG PO TABS: 1000.0000 mg | ORAL_TABLET | Freq: Two times a day (BID) | ORAL | 0 refills | Status: DC

## 2021-10-10 NOTE — Patient Instructions (Addendum)
Visit Information  Phone number for Pharmacist: [phone]   Goals Addressed             This Visit's Progress    Monitor and Manage My Blood Sugar-Diabetes Type 2       Timeframe:  Long-Range Goal Priority:  High Start Date:      10/10/21                       Expected End Date:   10/10/22                    Follow Up Date April 2023   - check blood sugar at prescribed times - check blood sugar if I feel it is too high or too low - enter blood sugar readings and medication or insulin into daily log - take the blood sugar log to all doctor visits    Why is this important?   Checking your blood sugar at home helps to keep it from getting very high or very low.  Writing the results in a diary or log helps the doctor know how to care for you.  Your blood sugar log should have the time, date and the results.  Also, write down the amount of insulin or other medicine that you take.  Other information, like what you ate, exercise done and how you were feeling, will also be helpful.     Notes:         Care Plan : [place]  Updates made by Charlton Haws, RPH since 10/10/2021 12:00 AM     Problem: Hypertension, Hyperlipidemia, Diabetes, Coronary Artery Disease, COPD, Osteoarthritis, and Bipolar disorder   Priority: High     Long-Range Goal: Disease management   Start Date: 05/11/2021  Expected End Date: 05/11/2022  This Visit's Progress: On track  Recent Progress: On track  Priority: High  Note:   Current Barriers:  Unable to independently monitor therapeutic efficacy Suboptimal therapeutic regimen for diabetes  Pharmacist Clinical Goal(s):  Patient will achieve adherence to monitoring guidelines and medication adherence to achieve therapeutic efficacy adhere to plan to optimize therapeutic regimen for diabetes as evidenced by report of adherence to recommended medication management changes through collaboration with PharmD and provider.    Interventions: 1:1 collaboration with Ria Bush, MD regarding development and update of comprehensive plan of care as evidenced by provider attestation and co-signature Inter-disciplinary care team collaboration (see longitudinal plan of care) Comprehensive medication review performed; medication list updated in electronic medical record  Hypertension (BP goal <130/80) -Controlled - BP is at goal home, on avg; pt reports marked improvement in chest pain since increasing Imdur; she reports mild swelling in legs, improved since starting torsemide; she reports minimal improvement in SOB since starting torsemide -ECHO 05/2020 - LVEF 40% and Grade 1 diastolic dysfunction (prior to CABG) -Denies hypotensive/hypertensive symptoms -Current home readings: 120-140/40-50s (checking every day) -Current treatment: Isosorbide MN 30 mg daily - Appropriate, Effective, Safe, Accessible Torsemide 20 mg daily - Appropriate, Effective, Safe, Accessible -Educated on BP goals and benefits of medications for prevention of heart attack, stroke and kidney damage; Daily salt intake goal < 2300 mg; -Counseled that torsemide was added by cardiologist to help with SOB -Counseled to monitor BP at home daily -Recommended to continue current medication  Hyperlipidemia: (LDL goal < 70) -Controlled - LDL is at goal; pt endorses compliance with statin and denies issues -Hx CAD, CABG 2012, 07/2020; Lexiscan Myoview 11/09/2020  with no significant ischemia, fixed apical defect. -Current treatment: Simvastatin 40 mg daily - Appropriate, Effective, Safe, Accessible Aspirin 81 mg daily -Appropriate, Effective, Safe, Accessible -Educated on Cholesterol goals; Benefits of statin for ASCVD risk reduction; -Recommended to continue current medication  Diabetes (A1c goal <7%) -Not ideally controlled - A1c was at goal in Aug 2022 before pioglitazone was discontinued in October; pt endorses compliance with metformin; she  appears to be experiencing some level of dawn phenomenon as fasting BG are higher than bedtime BG -Current home glucose readings fasting glucose: 151 (pizza last night), 165 (shrimp/broccoli), 180 (hotdogs/coleslaw), 155, 121 post prandial glucose: 137, 161, 164, 159, 169, 128, 169 -Denies hypoglycemic/hyperglycemic symptoms -Current medications: Metformin 500 mg -2 tab BID -Appropriate, Query Effective, Safe, Accessible Testing supplies -Medications previously tried: Novolin N, glimepiride, pioglitazone -Current meal patterns:  dinner: pizza once a week, hamburgers (no buns), salad, spaghetti every 1-2 weeks, beefaroni  drinks: diet soda, water (4-5 bottles per day) -Current exercise: step exercises - 4000 steps/day (limited d/t knees, SOB) -Discussed benefits of newer DM medications (GLP-1, SGLT-2 inhibitor); given cardiac history Ozempic would be a good option for secondary ASCVD prevention as well as weight loss; an SGLT2-I would also be beneficial for ASCVD risk reduction and swelling (Farxiga can be obtained free through [employer] -pt is already approved for [employer]) -Recommend checking A1c prior to med changes - scheduled CPE for March; if A1c is > 7% would recommend adding Farxiga 10 mg daily  COPD (Goal: control symptoms and prevent exacerbations) -Controlled - Pt notes improvement since taking Breztri now that she gets it free through PAP -Gold Grade: Gold 1 (FEV1>80%) -Current COPD Classification:  A (low sx, <2 exacerbations/yr) -MMRC/CAT score: not on file -Pulmonary function testing: 04/20/20 - FEV1 84% predicted, FEV1/FVC 0.76 -Exacerbations requiring treatment in last 6 months: 0 -Current treatment  Breztri 160-9-4.8 mcg/act 2 puffs BID (PAP) -Appropriate, Effective, Safe, Accessible Albuterol HFA prn - Appropriate, Effective, Safe, Accessible Oxygen 2L HS -Medications previously tried: Trelegy ($$), Anoro, Symbciort  -Patient denies consistent use of maintenance  inhaler -Frequency of rescue inhaler use: every other day -Counseled pt is enrolled in [employer] through 08/20/2022, at end of 2023 we can re-apply for following year -Recommend to continue current medication  Bipolar disorder (Goal: manage symptoms) -Controlled - Pt reports "ziprasidone is a life saver"; she never wants to change this med -Current treatment: Ziprasidone 40 mg BID -Appropriate, Effective, Safe, Accessible -Medications previously tried/failed: quetiapine, diazepam -Connected with PCP for mental health support -Recommended to continue current medication  Migraines (Goal: prevent episodes) -Not ideally controlled - pt stopped Ajovy months ago because it was not helping and it was very expensive -Pt has appt 07/19/21 but had to cancel due to husband's health issues -Current treatment  Cyclobenzaprine 10 mg PRN -Appropriate, Query Effective Excedrin PRN (APAP/caffeine/ASA) - Appropriate, Query Effective -Medications previously tried: Nurtec, topamax, Ajovy -Discussed that there are other medications available for migraine prevention that can be tried; pt to discuss her concerns with her neurologist  Pain (Goal: manage symptoms) -Not ideally controlled - pt reports gel injections in her knee were not helpful; she is meant to follow up with ortho for next steps but will have a hard time getting to any appts in immediate future due to husband's poor health -Pt requests refill for tramadol - she uses very seldom but back pain has been worse lately due to caring for her husband and sleeping on couch while he is in living room in hospital  bed;  -osteoarthritis of knee, cervical pain, fibromyalgia -Current treatment  Cyclobenzaprine 10 mg PRN -Appropriate, Query Effective Gabapentin 100 mg 1-3 cap HS -Appropriate, Query Effective Naproxen - 1 tab BID -Appropriate, Query Effective Tylenol 650 mg - 2 tab BID -Appropriate, Query Effective Tramadol 50 mg PRN (last rx 08/11/21)  -Appropriate, Query Effective -Counseled on benefits/risks of increasing gabapentin, advised it is safe to try higher dose -Counseled on risks of chronic NSAID use -  advised her to use the lowest effective dose of NSAID to control her pain in order to reduce CV and kidney risks  Health Maintenance -Vaccine gaps: TDAP  Patient Goals/Self-Care Activities Patient will:  - take medications as prescribed focus on medication adherence by pill box -check glucose twice a day, document, and provide at future appointments -check blood pressure daily, document, and provide at future appointments -Reduce naproxen to 1 tablet daily, up to 2 daily if needed -Contact neurologist regarding migraine medication -Contact orthopedist for knee issues      Patient verbalizes understanding of instructions and care plan provided today and agrees to view in [place]. Active MyChart status confirmed with patient.   Telephone follow up appointment with pharmacy team member scheduled for: 2 months  Charlene Brooke, PharmD, [facility] Clinical Pharmacist [facility] [phone]

## 2021-10-12 ENCOUNTER — Other Ambulatory Visit: Payer: Self-pay | Admitting: Family Medicine

## 2021-10-12 NOTE — Telephone Encounter (Signed)
Refill request Simvastatin Last office visit 03/30/21 Last refill 07/02/20 #90/3 Upcoming appointment 11/11/21 See allergy/contraindication

## 2021-10-18 DIAGNOSIS — I10 Essential (primary) hypertension: Secondary | ICD-10-CM | POA: Diagnosis not present

## 2021-10-18 DIAGNOSIS — E118 Type 2 diabetes mellitus with unspecified complications: Secondary | ICD-10-CM | POA: Diagnosis not present

## 2021-10-18 DIAGNOSIS — E785 Hyperlipidemia, unspecified: Secondary | ICD-10-CM | POA: Diagnosis not present

## 2021-10-18 DIAGNOSIS — E1169 Type 2 diabetes mellitus with other specified complication: Secondary | ICD-10-CM | POA: Diagnosis not present

## 2021-10-18 DIAGNOSIS — I2510 Atherosclerotic heart disease of native coronary artery without angina pectoris: Secondary | ICD-10-CM | POA: Diagnosis not present

## 2021-10-18 DIAGNOSIS — J449 Chronic obstructive pulmonary disease, unspecified: Secondary | ICD-10-CM

## 2021-10-18 DIAGNOSIS — Z794 Long term (current) use of insulin: Secondary | ICD-10-CM

## 2021-10-18 DIAGNOSIS — F3178 Bipolar disorder, in full remission, most recent episode mixed: Secondary | ICD-10-CM

## 2021-10-18 DIAGNOSIS — R69 Illness, unspecified: Secondary | ICD-10-CM | POA: Diagnosis not present

## 2021-10-20 ENCOUNTER — Ambulatory Visit: Payer: Medicare HMO | Admitting: Cardiology

## 2021-10-20 ENCOUNTER — Other Ambulatory Visit: Payer: Self-pay

## 2021-10-20 ENCOUNTER — Encounter: Payer: Self-pay | Admitting: Cardiology

## 2021-10-20 VITALS — BP 114/60 | HR 89 | Ht 62.0 in | Wt 217.0 lb

## 2021-10-20 DIAGNOSIS — I25118 Atherosclerotic heart disease of native coronary artery with other forms of angina pectoris: Secondary | ICD-10-CM | POA: Diagnosis not present

## 2021-10-20 DIAGNOSIS — Z6839 Body mass index (BMI) 39.0-39.9, adult: Secondary | ICD-10-CM | POA: Diagnosis not present

## 2021-10-20 DIAGNOSIS — E785 Hyperlipidemia, unspecified: Secondary | ICD-10-CM

## 2021-10-20 MED ORDER — ISOSORBIDE MONONITRATE ER 60 MG PO TB24: 60.0000 mg | ORAL_TABLET | Freq: Every day | ORAL | 1 refills | Status: DC

## 2021-10-20 MED ORDER — ISOSORBIDE MONONITRATE ER 60 MG PO TB24: 60.0000 mg | ORAL_TABLET | Freq: Every day | ORAL | 5 refills | Status: DC

## 2021-10-20 NOTE — Patient Instructions (Signed)
Medication Instructions:  ? ?Your physician has recommended you make the following change in your medication:  ? ?INCREASE your Isosorbide (Imdur) to 60 MG once a day. ? ?*If you need a refill on your cardiac medications before your next appointment, please call your pharmacy* ? ? ?Lab Work: ?None ordered ?If you have labs (blood work) drawn today and your tests are completely normal, you will receive your results only by: ?MyChart Message (if you have MyChart) OR ?A paper copy in the mail ?If you have any lab test that is abnormal or we need to change your treatment, we will call you to review the results. ? ? ?Testing/Procedures: ?None ordered ? ? ?Follow-Up: ?At [facility], you and your health needs are our priority.  As part of our continuing mission to provide you with exceptional heart care, we have created designated Provider Care Teams.  These Care Teams include your primary Cardiologist (physician) and Advanced Practice Providers (APPs -  Physician Assistants and Nurse Practitioners) who all work together to provide you with the care you need, when you need it. ? ?We recommend signing up for the patient portal called "MyChart".  Sign up information is provided on this After Visit Summary.  MyChart is used to connect with patients for Virtual Visits (Telemedicine).  Patients are able to view lab/test results, encounter notes, upcoming appointments, etc.  Non-urgent messages can be sent to your provider as well.   ?To learn more about what you can do with MyChart, go to [email or web address].   ? ?Your next appointment:   ?3 month(s) ? ?The format for your next appointment:   ?In Person ? ?Provider:   ?You may see Kate Sable, MD or one of the following Advanced Practice Providers on your designated Care Team:   ?Murray Hodgkins, NP ?Christell Faith, PA-C ?Cadence Kathlen Mody, PA-C  ? ? ?Other Instructions ? ? ?

## 2021-10-20 NOTE — Progress Notes (Signed)
Cardiology Office Note:    Date:  10/20/2021   ID:  [name], DOB 10/19/1950, MRN [id number]  PCP:  Ria Bush, MD  [facility] Cardiologist:  Kate Sable, MD  [place] Electrophysiologist:  None   Referring MD: Ria Bush, MD   Chief Complaint  Patient presents with   Other    6 month follow up -- Patient c/o chest pain and SOB. She has been taking care of her husband since December. Meds reviewed verbally with patient.     History of Present Illness:    [name] is a 71 y.o. female with a hx of CAD/CABG x 1 (LIMA-LAD, 07/2020), hypertension, hyperlipidemia, diabetes, former smoker x40 years, COPD, OSA who presents for follow-up.  Being seen for CAD, and chronic stable angina.  Symptoms improved slightly with Imdur 30 mg daily.  Patient states taking care of her husband who is currently ill at home.  Whenever she overexerts herself, symptoms of chest pain worsen.  States being more active, eating healthier, has lost about 20 pounds since last visit.  Saw sleep specialist for OSA, underwent sleep study, was told she does not need a CPAP mask.  Prior notes Intraoperative TEE, EF approximately 50%. Right and left heart cath 06/2020 single-vessel CAD sequential 70% ostial, 90% mid LAD. CAD/CABG x1 LIMA to LAD 07/2020 Lexiscan Myoview 11/09/2020 with no significant ischemia, fixed apical defect.   Past Medical History:  Diagnosis Date   Anxiety    Arthritis    per patient "all over body"   Bipolar disorder [facility])    has stopped seeing psychiatrist   CAD (coronary artery disease)    a. 06/2020 MV: small, sev, apical inf/apical defect-scar vs ischemia. EF 45%; b. 06/2020 Cath: LM 9m, LAD 70ost, 103m, LCX nl, RCA nl; c. 07/2020 CABG x 1: LIMA->LAD.   Cataract    Chronic bronchitis    COPD (chronic obstructive pulmonary disease) (HCC)    Coronary artery disease, non-occlusive    Depression    Dyspnea 2012   s/p pulm/cards w/u WNL, thought  due to obesity/deconditioning   Fibromyalgia    GERD (gastroesophageal reflux disease)    HFmrEF (heart failure with mid-range ejection fraction) ([place])    a. 05/2020 Echo: EF 40%, glob HK, mild LVH, Gr1 DD; b. 06/2020 EF by SPECT: 45%; c. 07/2020 intraop TEE: EF 50%.   History of chicken pox    HLD (hyperlipidemia)    Hypertension    Hypothyroidism    Ischemic cardiomyopathy    a. 05/2020 Echo: EF 40%; b. 06/2020 EF by SPECT: 45%; c. 07/2020 intraop TEE: EF 50%.   Migraine    OSA (obstructive sleep apnea) 03/16/2011   Pneumonia    Sleep apnea    per patient cannot tolerate CPAP   T2DM (type 2 diabetes mellitus) ([place]) 2012   DM education 06/2011   Urinary incontinence     Past Surgical History:  Procedure Laterality Date   BREAST CYST ASPIRATION Right 2003   cardiac catherization  03/2011   x3 [facility]), mild nonobstructive CAD   COLONOSCOPY  12/2011   hyperplastic polyp x2,. diverticulosis, rec rpt 10 yrs   CORONARY ARTERY BYPASS GRAFT N/A 08/03/2020   Procedure: CORONARY ARTERY BYPASS GRAFTING TIMES ONE USING LEFT INTERNAL MAMMARY ARTERY.;  Surgeon: Gaye Pollack, MD;  Location: [facility] OR;  Service: Open Heart Surgery;  Laterality: N/A;   ESOPHAGOGASTRODUODENOSCOPY  2006   ESOPHAGOGASTRODUODENOSCOPY  05/2020   no esophageal abnormality to explain dysphagia -  esophagus dilated. Erosive gastropathy - neg H pylori [facility])   KNEE SURGERY  2006   left, torn meniscus   RIGHT/LEFT HEART CATH AND CORONARY ANGIOGRAPHY N/A 07/13/2020   Procedure: RIGHT/LEFT HEART CATH AND CORONARY ANGIOGRAPHY;  Surgeon: Nelva Bush, MD;  Location: [place] CV LAB;  Service: Cardiovascular;  Laterality: N/A;   TEE WITHOUT CARDIOVERSION N/A 08/03/2020   Procedure: TRANSESOPHAGEAL ECHOCARDIOGRAM (TEE);  Surgeon: Gaye Pollack, MD;  Location: [place];  Service: Open Heart Surgery;  Laterality: N/A;   TOTAL ABDOMINAL HYSTERECTOMY  2004   fibroids, cervix remained   TOTAL KNEE ARTHROPLASTY Left 08/2012    [name], [place] ortho    Current Medications: Current Meds  Medication Sig   acetaminophen (TYLENOL) 650 MG CR tablet Take 1,300 mg by mouth 2 (two) times daily.   albuterol (VENTOLIN HFA) 108 (90 Base) MCG/ACT inhaler Inhale 2 puffs into the lungs every 6 (six) hours as needed for wheezing or shortness of breath.   Apple Cider Vinegar 600 MG CAPS    aspirin 81 MG EC tablet Take 1 tablet (81 mg total) by mouth daily.   aspirin-acetaminophen-caffeine (EXCEDRIN MIGRAINE) 250-250-65 MG tablet Take by mouth every 6 (six) hours as needed for headache.   Boswellia-Glucosamine-Vit D (OSTEO BI-FLEX ONE PER DAY PO) Take 1 tablet by mouth daily.   Budeson-Glycopyrrol-Formoterol (BREZTRI AEROSPHERE) 160-9-4.8 MCG/ACT AERO Inhale 2 puffs into the lungs in the morning and at bedtime.   cholecalciferol (VITAMIN D3) 25 MCG (1000 UNIT) tablet Take 1,000 Units by mouth daily.   cyclobenzaprine (FLEXERIL) 10 MG tablet TAKE 1/2 TO 1 TABLET BY MOUTH TWICE A DAY AS NEEDED FOR MUSCLE SPASMS AND MIGRAINE   famotidine (PEPCID) 20 MG tablet Take 20 mg by mouth every other day. Alternates with omeprazole   gabapentin (NEURONTIN) 100 MG capsule Take 1-3 capsules (100-300 mg total) by mouth at bedtime.   levothyroxine (SYNTHROID) 100 MCG tablet TAKE 1 TABLET DAILY, AND ON2 DAYS A WEEK, TAKE AN     EXTRA 1/2 TABLET   loratadine (CLARITIN) 10 MG tablet Take 1 tablet (10 mg total) by mouth daily.   Melatonin 5 MG CAPS Take by mouth.   metFORMIN (GLUCOPHAGE) 500 MG tablet Take 2 tablets (1,000 mg total) by mouth 2 (two) times daily with a meal.   Multiple Vitamin (MULTIVITAMIN WITH MINERALS) TABS tablet Take 1 tablet by mouth daily. One A Day for Women   naproxen sodium (ALEVE) 220 MG tablet Take 220 mg by mouth 2 (two) times daily with a meal.   nystatin cream (MYCOSTATIN) APPLY TO AFFECTED AREA TWICE A DAY   omeprazole (PRILOSEC) 20 MG capsule Take 1 capsule (20 mg total) by mouth daily. Take 1 daily-Alternates with  famotidine   ONE TOUCH LANCETS MISC Use to check sugar twice daily Dx: E11.8   ONETOUCH ULTRA test strip USE TO CHECK BLOOD SUGAR 2 TIMES   DAILY   OXYGEN Inhale 2 L into the lungs at bedtime.   Rhubarb (ESTROVEN COMPLETE PO) Take 1 tablet by mouth daily.   simvastatin (ZOCOR) 40 MG tablet TAKE 1 TABLET AT BEDTIME   torsemide (DEMADEX) 20 MG tablet Take 1 tablet (20 mg total) by mouth daily.   traMADol (ULTRAM) 50 MG tablet Take 1 tablet (50 mg total) by mouth 2 (two) times daily as needed for moderate pain.   vitamin B-12 (CYANOCOBALAMIN) 1000 MCG tablet Take 1,000 mcg by mouth daily.   ziprasidone (GEODON) 40 MG capsule TAKE 1 CAPSULE EVERY  MORNING AND AT BEDTIME     Allergies:   Crestor [rosuvastatin], Lipitor [atorvastatin], Lithium, and Quetiapine   Social History   Socioeconomic History   Marital status: Married    Spouse name: Not on file   Number of children: Not on file   Years of education: Not on file   Highest education level: Not on file  Occupational History   Occupation: disabled    Employer: OTHER  Tobacco Use   Smoking status: Former    Packs/day: 2.00    Years: 43.00    Pack years: 86.00    Types: Cigarettes    Quit date: 05/17/2010    Years since quitting: 11.4   Smokeless tobacco: Never   Tobacco comments:    QUIT 2011  Vaping Use   Vaping Use: Never used  Substance and Sexual Activity   Alcohol use: No    Alcohol/week: 0.0 standard drinks   Drug use: No   Sexual activity: Yes    Partners: Male  Other Topics Concern   Not on file  Social History Narrative   Caffeine: 2-3 coffee/am, 1 cup tea in afternoon; Lives with husband, 1 dog, 2 cats; Occupation: disability since 2009 for bipolar, previously worked at [employer]; Diet: not many fruits, good vegetables, good amt water.      Smoker 43 years; quit in 2011. No alcohol. Lives in [place]; with husband.    Social Determinants of Health   Financial Resource Strain: Low Risk    Difficulty of  Paying Living Expenses: Not hard at all  Food Insecurity: No Food Insecurity   Worried About Charity fundraiser in the Last Year: Never true   [place] in the Last Year: Never true  Transportation Needs: No Transportation Needs   Lack of Transportation (Medical): No   Lack of Transportation (Non-Medical): No  Physical Activity: Inactive   Days of Exercise per Week: 0 days   Minutes of Exercise per Session: 0 min  Stress: No Stress Concern Present   Feeling of Stress : Not at all  Social Connections: Not on file     Family History: The patient's family history includes Acute lymphoblastic leukemia (age of onset: 55) in her cousin; Aneurysm in her maternal uncle; Arthritis in her mother; Asthma in her maternal uncle; Breast cancer in her maternal aunt; COPD in her sister; Cancer in her maternal uncle and paternal uncle; Cancer (age of onset: 45) in her father; Cancer (age of onset: 36) in her maternal aunt; Colon cancer (age of onset: 11) in her maternal uncle; Diabetes in her father; Hypertension in her mother; Stroke in her mother; Thyroid disease in her father. There is no history of Coronary artery disease, Stomach cancer, Rectal cancer, or Esophageal cancer.  ROS:   Please see the history of present illness.     All other systems reviewed and are negative.  EKGs/Labs/Other Studies Reviewed:    The following studies were reviewed today:   EKG:  EKG is ordered today.  Normal sinus rhythm, cannot rule out anterior infarct.  Recent Labs: 02/25/2021: Hemoglobin 10.7; Platelets 427 03/04/2021: BUN 19; Creatinine, Ser 0.93; Potassium 4.0; Sodium 137  Recent Lipid Panel    Component Value Date/Time   CHOL 131 05/21/2020 0915   TRIG 239.0 (H) 05/21/2020 0915   HDL 42.80 05/21/2020 0915   CHOLHDL 3 05/21/2020 0915   VLDL 47.8 (H) 05/21/2020 0915   LDLCALC 48 12/08/2013 0824   LDLDIRECT 65.0 05/21/2020 0915  Risk Assessment/Calculations:      Physical Exam:    VS:   BP 114/60 (BP Location: Right Arm, Patient Position: Sitting, Cuff Size: Normal)    Pulse 89    Ht 5\' 2"  (1.575 m)    Wt 217 lb (98.4 kg)    SpO2 97%    BMI 39.69 kg/m     Wt Readings from Last 3 Encounters:  10/20/21 217 lb (98.4 kg)  06/23/21 236 lb 6.4 oz (107.2 kg)  04/21/21 236 lb (107 kg)     GEN:  Well nourished, well developed in no acute distress HEENT: Normal NECK: No JVD; No carotid bruits CARDIAC: RRR, no murmurs, rubs, gallops RESPIRATORY:  Clear to auscultation, decreased breath sounds at bases ABDOMEN: Soft, non-tender, distended MUSCULOSKELETAL:  No edema; left-sided chest tenderness noted. SKIN: Warm and dry NEUROLOGIC:  Alert and oriented x 3 PSYCHIATRIC:  Normal affect   ASSESSMENT:    1. Coronary artery disease of native artery of native heart with stable angina pectoris ([place])   2. Hyperlipidemia LDL goal <70   3. BMI 39.0-39.9,adult     PLAN:    In order of problems listed above:  CAD/CABG x1 on 07/2020.  Symptoms consistent with stable angina.  Increase Imdur to 60 mg daily.  Last EF on intraprocedural TEE 07/2020, EF 50%.  Continue aspirin, statin.  Increase stress of taking care of husband likely contributing to angina. hyperlipidemia.  LDL at goal, continue simvastatin. morbidly obese, congratulated on weight loss, advised to continue low-calorie diet and increased activity.    Follow-up in 2 to 3 months.   Medication Adjustments/Labs and Tests Ordered: Current medicines are reviewed at length with the patient today.  Concerns regarding medicines are outlined above.  Orders Placed This Encounter  Procedures   EKG 12-Lead     Meds ordered this encounter  Medications   DISCONTD: isosorbide mononitrate (IMDUR) 60 MG 24 hr tablet    Sig: Take 1 tablet (60 mg total) by mouth daily.    Dispense:  30 tablet    Refill:  5   isosorbide mononitrate (IMDUR) 60 MG 24 hr tablet    Sig: Take 1 tablet (60 mg total) by mouth daily.    Dispense:  90  tablet    Refill:  1      Patient Instructions  Medication Instructions:   Your physician has recommended you make the following change in your medication:   INCREASE your Isosorbide (Imdur) to 60 MG once a day.  *If you need a refill on your cardiac medications before your next appointment, please call your pharmacy*   Lab Work: None ordered If you have labs (blood work) drawn today and your tests are completely normal, you will receive your results only by: [place] (if you have MyChart) OR A paper copy in the mail If you have any lab test that is abnormal or we need to change your treatment, we will call you to review the results.   Testing/Procedures: None ordered   Follow-Up: At [facility], you and your health needs are our priority.  As part of our continuing mission to provide you with exceptional heart care, we have created designated Provider Care Teams.  These Care Teams include your primary Cardiologist (physician) and Advanced Practice Providers (APPs -  Physician Assistants and Nurse Practitioners) who all work together to provide you with the care you need, when you need it.  We recommend signing up for the patient portal called "MyChart".  Sign up information is provided on this After Visit Summary.  MyChart is used to connect with patients for Virtual Visits (Telemedicine).  Patients are able to view lab/test results, encounter notes, upcoming appointments, etc.  Non-urgent messages can be sent to your provider as well.   To learn more about what you can do with MyChart, go to [email or web address].    Your next appointment:   3 month(s)  The format for your next appointment:   In Person  Provider:   You may see Kate Sable, MD or one of the following Advanced Practice Providers on your designated Care Team:   Murray Hodgkins, NP Christell Faith, PA-C Cadence Kathlen Mody, [place]    Other Instructions     Signed, Kate Sable, MD   10/20/2021 12:28 PM    [place]

## 2021-11-04 DIAGNOSIS — J449 Chronic obstructive pulmonary disease, unspecified: Secondary | ICD-10-CM | POA: Diagnosis not present

## 2021-11-07 ENCOUNTER — Other Ambulatory Visit: Payer: Self-pay | Admitting: Family Medicine

## 2021-11-07 NOTE — Telephone Encounter (Signed)
Name of Medication: Tramadol ?Name of Pharmacy: [facility] ?Last Fill or Written Date and Quantity: 09/20/21, #30 ?Last Office Visit and Type: 03/30/21, 3 mo DM f/u ?Next Office Visit and Type: 11/11/21, AWV prt 2 ?Last Controlled Substance Agreement Date: none ?Last UDS: none ? ? ?

## 2021-11-08 NOTE — Telephone Encounter (Signed)
ERx 

## 2021-11-09 ENCOUNTER — Other Ambulatory Visit: Payer: Self-pay | Admitting: Family Medicine

## 2021-11-09 ENCOUNTER — Ambulatory Visit: Payer: Medicare HMO | Admitting: Family Medicine

## 2021-11-09 DIAGNOSIS — Z1231 Encounter for screening mammogram for malignant neoplasm of breast: Secondary | ICD-10-CM

## 2021-11-11 ENCOUNTER — Ambulatory Visit (INDEPENDENT_AMBULATORY_CARE_PROVIDER_SITE_OTHER): Payer: Medicare HMO | Admitting: Family Medicine

## 2021-11-11 ENCOUNTER — Encounter: Payer: Self-pay | Admitting: Family Medicine

## 2021-11-11 ENCOUNTER — Other Ambulatory Visit: Payer: Self-pay

## 2021-11-11 VITALS — BP 124/62 | HR 95 | Temp 97.3°F | Ht 61.75 in | Wt 223.0 lb

## 2021-11-11 DIAGNOSIS — Z794 Long term (current) use of insulin: Secondary | ICD-10-CM

## 2021-11-11 DIAGNOSIS — E118 Type 2 diabetes mellitus with unspecified complications: Secondary | ICD-10-CM

## 2021-11-11 DIAGNOSIS — E039 Hypothyroidism, unspecified: Secondary | ICD-10-CM | POA: Diagnosis not present

## 2021-11-11 DIAGNOSIS — K219 Gastro-esophageal reflux disease without esophagitis: Secondary | ICD-10-CM

## 2021-11-11 DIAGNOSIS — B370 Candidal stomatitis: Secondary | ICD-10-CM

## 2021-11-11 DIAGNOSIS — G4733 Obstructive sleep apnea (adult) (pediatric): Secondary | ICD-10-CM | POA: Diagnosis not present

## 2021-11-11 DIAGNOSIS — R69 Illness, unspecified: Secondary | ICD-10-CM | POA: Diagnosis not present

## 2021-11-11 DIAGNOSIS — D75839 Thrombocytosis, unspecified: Secondary | ICD-10-CM

## 2021-11-11 DIAGNOSIS — Z951 Presence of aortocoronary bypass graft: Secondary | ICD-10-CM

## 2021-11-11 DIAGNOSIS — Z0000 Encounter for general adult medical examination without abnormal findings: Secondary | ICD-10-CM

## 2021-11-11 DIAGNOSIS — R0609 Other forms of dyspnea: Secondary | ICD-10-CM

## 2021-11-11 DIAGNOSIS — M150 Primary generalized (osteo)arthritis: Secondary | ICD-10-CM

## 2021-11-11 DIAGNOSIS — I700 Atherosclerosis of aorta: Secondary | ICD-10-CM

## 2021-11-11 DIAGNOSIS — R000 Tachycardia, unspecified: Secondary | ICD-10-CM

## 2021-11-11 DIAGNOSIS — D649 Anemia, unspecified: Secondary | ICD-10-CM

## 2021-11-11 DIAGNOSIS — I77811 Abdominal aortic ectasia: Secondary | ICD-10-CM

## 2021-11-11 DIAGNOSIS — I10 Essential (primary) hypertension: Secondary | ICD-10-CM

## 2021-11-11 DIAGNOSIS — E1169 Type 2 diabetes mellitus with other specified complication: Secondary | ICD-10-CM | POA: Diagnosis not present

## 2021-11-11 DIAGNOSIS — F3178 Bipolar disorder, in full remission, most recent episode mixed: Secondary | ICD-10-CM

## 2021-11-11 DIAGNOSIS — R590 Localized enlarged lymph nodes: Secondary | ICD-10-CM

## 2021-11-11 DIAGNOSIS — L988 Other specified disorders of the skin and subcutaneous tissue: Secondary | ICD-10-CM

## 2021-11-11 DIAGNOSIS — E785 Hyperlipidemia, unspecified: Secondary | ICD-10-CM

## 2021-11-11 DIAGNOSIS — J449 Chronic obstructive pulmonary disease, unspecified: Secondary | ICD-10-CM

## 2021-11-11 DIAGNOSIS — M159 Polyosteoarthritis, unspecified: Secondary | ICD-10-CM

## 2021-11-11 DIAGNOSIS — K760 Fatty (change of) liver, not elsewhere classified: Secondary | ICD-10-CM

## 2021-11-11 DIAGNOSIS — I25118 Atherosclerotic heart disease of native coronary artery with other forms of angina pectoris: Secondary | ICD-10-CM

## 2021-11-11 LAB — COMPREHENSIVE METABOLIC PANEL
ALT: 13 U/L (ref 0–35)
AST: 14 U/L (ref 0–37)
Albumin: 4.5 g/dL (ref 3.5–5.2)
Alkaline Phosphatase: 51 U/L (ref 39–117)
BUN: 16 mg/dL (ref 6–23)
CO2: 27 mEq/L (ref 19–32)
Calcium: 9.4 mg/dL (ref 8.4–10.5)
Chloride: 106 mEq/L (ref 96–112)
Creatinine, Ser: 0.94 mg/dL (ref 0.40–1.20)
GFR: 62.06 mL/min (ref >60.00)
Glucose, Bld: 139 mg/dL — ABNORMAL HIGH (ref 70–99)
Potassium: 4.8 mEq/L (ref 3.5–5.1)
Sodium: 142 mEq/L (ref 135–145)
Total Bilirubin: 0.2 mg/dL (ref 0.2–1.2)
Total Protein: 7.1 g/dL (ref 6.0–8.3)

## 2021-11-11 LAB — LIPID PANEL
Cholesterol: 126 mg/dL (ref 0–200)
HDL: 38.5 mg/dL — ABNORMAL LOW (ref >39.00)
NonHDL: 87.91
Total CHOL/HDL Ratio: 3
Triglycerides: 297 mg/dL — ABNORMAL HIGH (ref 0.0–149.0)
VLDL: 59.4 mg/dL — ABNORMAL HIGH (ref 0.0–40.0)

## 2021-11-11 LAB — CBC WITH DIFFERENTIAL/PLATELET
Basophils Absolute: 0.1 10*3/uL (ref 0.0–0.1)
Basophils Relative: 0.8 % (ref 0.0–3.0)
Eosinophils Absolute: 0.1 10*3/uL (ref 0.0–0.7)
Eosinophils Relative: 0.6 % (ref 0.0–5.0)
HCT: 34.2 % — ABNORMAL LOW (ref 36.0–46.0)
Hemoglobin: 10.9 g/dL — ABNORMAL LOW (ref 12.0–15.0)
Lymphocytes Relative: 17 % (ref 12.0–46.0)
Lymphs Abs: 2 10*3/uL (ref 0.7–4.0)
MCHC: 32 g/dL (ref 30.0–36.0)
MCV: 84.3 fl (ref 78.0–100.0)
Monocytes Absolute: 0.7 10*3/uL (ref 0.1–1.0)
Monocytes Relative: 5.6 % (ref 3.0–12.0)
Neutro Abs: 9.2 10*3/uL — ABNORMAL HIGH (ref 1.4–7.7)
Neutrophils Relative %: 76 % (ref 43.0–77.0)
Platelets: 424 10*3/uL — ABNORMAL HIGH (ref 150.0–400.0)
RBC: 4.06 Mil/uL (ref 3.87–5.11)
RDW: 16.5 % — ABNORMAL HIGH (ref 11.5–15.5)
WBC: 12.1 10*3/uL — ABNORMAL HIGH (ref 4.0–10.5)

## 2021-11-11 LAB — LDL CHOLESTEROL, DIRECT: Direct LDL: 53 mg/dL

## 2021-11-11 LAB — MICROALBUMIN / CREATININE URINE RATIO
Creatinine,U: 66.4 mg/dL
Microalb Creat Ratio: 2.4 mg/g (ref 0.0–30.0)
Microalb, Ur: 1.6 mg/dL (ref 0.0–1.9)

## 2021-11-11 LAB — HEMOGLOBIN A1C: Hgb A1c MFr Bld: 6.6 % — ABNORMAL HIGH (ref 4.6–6.5)

## 2021-11-11 MED ORDER — METOPROLOL SUCCINATE ER 25 MG PO TB24: 25.0000 mg | ORAL_TABLET | Freq: Every day | ORAL | 3 refills | Status: DC

## 2021-11-11 MED ORDER — NYSTATIN 100000 UNIT/ML MT SUSP: 5.0000 mL | Freq: Three times a day (TID) | OROMUCOSAL | 0 refills | Status: DC

## 2021-11-11 NOTE — Assessment & Plan Note (Addendum)
Preventative protocols reviewed and updated unless pt declined. ?Discussed healthy diet and lifestyle.  ?Failed hearing screen and vision screen R eye. She is overdue for eye exam.  ?

## 2021-11-11 NOTE — Progress Notes (Addendum)
? ? Patient ID: [name], female    DOB: 1950-11-02, 71 y.o.   MRN: [id number] ? ?This visit was conducted in person. ? ?BP 124/62   Pulse 95   Temp (!) 97.3 ?F (36.3 ?C) (Temporal)   Ht 5' 1.75" (1.568 m)   Wt 223 lb (101.2 kg)   SpO2 95%   BMI 41.12 kg/m?   ? ?Orthostatic VS: ?Lying 122/68 ?Standing 116/62 ? ?CC: AMW/CPE ?Subjective:  ? ?HPI: ?[name] is a 71 y.o. female presenting on 11/11/2021 for Medicare Wellness ? ? ?Did not see health advisor.  ? ?Hearing Screening  ? '500Hz'$  '1000Hz'$  '2000Hz'$  '4000Hz'$   ?Right ear 25 0 40 40  ?Left ear 0 0 0 0  ?Comments: Heard practice tone in L ear.  ? ?Vision Screening  ? Right eye Left eye Both eyes  ?Without correction     ?With correction '20/40 20/30 20/30 '$  ?  ?Personnel officer Visit from 11/11/2021 in [facility] [place]  ?PHQ-2 Total Score 0  ? ?  ?  ? ?  11/11/2021  ?  9:40 AM 11/23/2020  ?  1:14 PM 09/01/2020  ?  3:29 PM 05/26/2020  ? 10:22 AM 04/24/2019  ? 11:31 AM  ?Fall Risk   ?Falls in the past year? 0 1 0 0 0  ?Number falls in past yr:  0     ?Injury with Fall?  0     ?Risk for fall due to :  Medication side effect Impaired mobility    ?Follow up  Falls evaluation completed;Falls prevention discussed Falls prevention discussed;Education provided    ? ?COPD - followed by pulm Dr Patsey Berthold on Judithann Sauger 2 puffs BID with patient assistance Aeronautical engineer). Noted significant physical deconditioning. She continues nocturnal O2 supplementation. Dyspnea mostly related to deconditioning and morbid obesity. This happens daily.  ?Has never had COVID.  ?Denies leg swelling.  ?No personal history of blood clots. Mother with h/o DVT. She is not on HRT.  ? ?CAD s/p 1v CABG 07/2020 sees cards Dr Garen Lah. Seen earlier this month with increase in imdur to 17mdaily due to stable angina. This hasn't helped. Notes ongoing chest pain and presyncope with minimal exertion, this is progressively worsening since she's been husband's caregiver.  ? ?Notes poorly  healing wound to L inner breast present for months. This is despite using neosporin and bandaids regularly.  ? ?Preventative: ?COLONOSCOPY Date: 12/2011 hyperplastic polyp x2,. diverticulosis, rec rpt 10 yrs ([facility] - will be due later this year. ?[facility] 11/2020 [facility] - upcoming already scheduled. She does breast exams at home.  ?Well woman - pap 2015 WNL. S/p total hysterectomy with bilat oophorectomy (fibroids) but cervix remains. Bladder sling placed at that time (2014). Always normal pap smears. We attempted pap smear 2019 unsuccessfully - pt declines repeat.  ?Lung cancer screening - started 03/2020, none since ?DEXA 04/2021 - WNL ?Flu yearly ?Pneumovax 2012, prevnar 2019  ?Td 2012  ?COVID vaccine - Moderna 09/2019, 10/2019, booster 06/2020, 12/2020, bivalent 06/2021 ?Zostavax - 2015 ?shingrix - 10/2017, 12/2017 ?Advanced directives: does not have set in place.  Packet provided previously. Husband would be HCPOA.  ?Seat belt use discussed  ?Sunscreen use discussed. No changing moles.  ?Quit smoking 2011 (1-2 ppd) 30+ PY hx.  ?Alcohol - none ?Dentist - has dentures, difficulty with fit  ?Eye exam - yearly - due for this ?Bowel - no constipation  ?Bladder - no incontinence  ? ?Caffeine: 2  coffee/am ?Lives with husband, 1 dog, 2 cats ?Occupation: disability since 2009 for bipolar, previously worked at [employer] ?Activity: tries to do arm exercises twice a week.  ?Diet: not many fruits, good vegetables, good amt water  ?   ? ?Relevant past medical, surgical, family and social history reviewed and updated as indicated. Interim medical history since our last visit reviewed. ?Allergies and medications reviewed and updated. ?Outpatient Medications Prior to Visit  ?Medication Sig Dispense Refill  ?? acetaminophen (TYLENOL) 650 MG CR tablet Take 1,300 mg by mouth 2 (two) times daily.    ?? albuterol (VENTOLIN HFA) 108 (90 Base) MCG/ACT inhaler Inhale 2 puffs into the lungs every 6 (six) hours as needed for  wheezing or shortness of breath. 18 g 3  ?? aspirin 81 MG EC tablet Take 1 tablet (81 mg total) by mouth daily.    ?? aspirin-acetaminophen-caffeine (EXCEDRIN MIGRAINE) 250-250-65 MG tablet Take by mouth every 6 (six) hours as needed for headache.    ?? Boswellia-Glucosamine-Vit D (OSTEO BI-FLEX ONE PER DAY PO) Take 1 tablet by mouth daily.    ?? Budeson-Glycopyrrol-Formoterol (BREZTRI AEROSPHERE) 160-9-4.8 MCG/ACT AERO Inhale 2 puffs into the lungs in the morning and at bedtime. 10.7 g 11  ?? cholecalciferol (VITAMIN D3) 25 MCG (1000 UNIT) tablet Take 1,000 Units by mouth daily.    ?? cyclobenzaprine (FLEXERIL) 10 MG tablet TAKE 1/2 TO 1 TABLET BY MOUTH TWICE A DAY AS NEEDED FOR MUSCLE SPASMS AND MIGRAINE 30 tablet 3  ?? famotidine (PEPCID) 20 MG tablet Take 20 mg by mouth every other day. Alternates with omeprazole    ?? gabapentin (NEURONTIN) 100 MG capsule Take 1-3 capsules (100-300 mg total) by mouth at bedtime. 200 capsule 1  ?? isosorbide mononitrate (IMDUR) 60 MG 24 hr tablet Take 1 tablet (60 mg total) by mouth daily. 90 tablet 1  ?? levothyroxine (SYNTHROID) 100 MCG tablet TAKE 1 TABLET DAILY, AND ON2 DAYS A WEEK, TAKE AN     EXTRA 1/2 TABLET 90 tablet 3  ?? loratadine (CLARITIN) 10 MG tablet Take 1 tablet (10 mg total) by mouth daily.    ?? Melatonin 5 MG CAPS Take by mouth.    ?? metFORMIN (GLUCOPHAGE) 500 MG tablet Take 2 tablets (1,000 mg total) by mouth 2 (two) times daily with a meal. 360 tablet 0  ?? Multiple Vitamin (MULTIVITAMIN WITH MINERALS) TABS tablet Take 1 tablet by mouth daily. One A Day for Women    ?? naproxen sodium (ALEVE) 220 MG tablet Take 220 mg by mouth 2 (two) times daily with a meal.    ?? nystatin cream (MYCOSTATIN) APPLY TO AFFECTED AREA TWICE A DAY 30 g 1  ?? omeprazole (PRILOSEC) 20 MG capsule Take 1 capsule (20 mg total) by mouth daily. Take 1 daily-Alternates with famotidine 90 capsule 3  ?? ONE TOUCH LANCETS MISC Use to check sugar twice daily Dx: E11.8 200 each 3  ??  ONETOUCH ULTRA test strip USE TO CHECK BLOOD SUGAR 2 TIMES   DAILY 200 strip 3  ?? OXYGEN Inhale 2 L into the lungs at bedtime.    ?? Rhubarb (ESTROVEN COMPLETE PO) Take 1 tablet by mouth daily.    ?? simvastatin (ZOCOR) 40 MG tablet TAKE 1 TABLET AT BEDTIME 90 tablet 0  ?? torsemide (DEMADEX) 20 MG tablet Take 1 tablet (20 mg total) by mouth daily. 90 tablet 3  ?? traMADol (ULTRAM) 50 MG tablet TAKE 1 TABLET (50 MG TOTAL) BY MOUTH TWICE A DAY AS NEEDED  FOR MODERATE PAIN 30 tablet 0  ?? vitamin B-12 (CYANOCOBALAMIN) 1000 MCG tablet Take 1,000 mcg by mouth daily.    ?? ziprasidone (GEODON) 40 MG capsule TAKE 1 CAPSULE EVERY       MORNING AND AT BEDTIME 180 capsule 1  ?? Apple Cider Vinegar 600 MG CAPS  (Patient not taking: Reported on 11/11/2021)    ? ?No facility-administered medications prior to visit.  ?  ? ?Per HPI unless specifically indicated in ROS section below ?Review of Systems  ?Constitutional:  Positive for appetite change. Negative for activity change, chills, fatigue, fever and unexpected weight change.  ?HENT:  Negative for hearing loss.   ?Eyes:  Negative for visual disturbance.  ?Respiratory:  Positive for shortness of breath. Negative for cough, chest tightness and wheezing.   ?Cardiovascular:  Positive for chest pain and palpitations. Negative for leg swelling.  ?Gastrointestinal:  Negative for abdominal distention, abdominal pain, blood in stool, constipation, diarrhea, nausea and vomiting.  ?Endocrine: Positive for heat intolerance.  ?Genitourinary:  Negative for difficulty urinating and hematuria.  ?Musculoskeletal:  Positive for arthralgias and neck pain. Negative for myalgias.  ?Skin:  Negative for rash.  ?Neurological:  Positive for dizziness. Negative for seizures, syncope and headaches.  ?     Presyncope  ?Hematological:  Negative for adenopathy. Does not bruise/bleed easily.  ?Psychiatric/Behavioral:  Positive for dysphoric mood. The patient is nervous/anxious.   ? ?Objective:  ?BP 124/62    Pulse 95   Temp (!) 97.3 ?F (36.3 ?C) (Temporal)   Ht 5' 1.75" (1.568 m)   Wt 223 lb (101.2 kg)   SpO2 95%   BMI 41.12 kg/m?   ?Wt Readings from Last 3 Encounters:  ?11/11/21 223 lb (101.2 kg)  ?10/20/21 217 lb (98.4

## 2021-11-11 NOTE — Assessment & Plan Note (Signed)

## 2021-11-11 NOTE — Patient Instructions (Addendum)
Labs today  ?I will see if we can order heart monitor. In the meantime, try toprol XL '25mg'$  daily for heart.  ?Take nystatin swish/swallow for possible throat yeast infection.  ?Check orthostatic vital signs today  ?You may cancel April 6th phone appointment with health advisor.  ?Call to schedule diabetic eye exam (Dr Wallace Going).  ?We will refer you to dermatologist for poorly healing wound on breast.  ?Good to see you today. Return as needed or in 3 months for follow up visit  ? ?Health Maintenance After Age 71 ?After age 34, you are at a higher risk for certain long-term diseases and infections as well as injuries from falls. Falls are a major cause of broken bones and head injuries in people who are older than age 58. Getting regular preventive care can help to keep you healthy and well. Preventive care includes getting regular testing and making lifestyle changes as recommended by your health care provider. Talk with your health care provider about: ?Which screenings and tests you should have. A screening is a test that checks for a disease when you have no symptoms. ?A diet and exercise plan that is right for you. ?What should I know about screenings and tests to prevent falls? ?Screening and testing are the best ways to find a health problem early. Early diagnosis and treatment give you the best chance of managing medical conditions that are common after age 25. Certain conditions and lifestyle choices may make you more likely to have a fall. Your health care provider may recommend: ?Regular vision checks. Poor vision and conditions such as cataracts can make you more likely to have a fall. If you wear glasses, make sure to get your prescription updated if your vision changes. ?Medicine review. Work with your health care provider to regularly review all of the medicines you are taking, including over-the-counter medicines. Ask your health care provider about any side effects that may make you more likely to  have a fall. Tell your health care provider if any medicines that you take make you feel dizzy or sleepy. ?Strength and balance checks. Your health care provider may recommend certain tests to check your strength and balance while standing, walking, or changing positions. ?Foot health exam. Foot pain and numbness, as well as not wearing proper footwear, can make you more likely to have a fall. ?Screenings, including: ?Osteoporosis screening. Osteoporosis is a condition that causes the bones to get weaker and break more easily. ?Blood pressure screening. Blood pressure changes and medicines to control blood pressure can make you feel dizzy. ?Depression screening. You may be more likely to have a fall if you have a fear of falling, feel depressed, or feel unable to do activities that you used to do. ?Alcohol use screening. Using too much alcohol can affect your balance and may make you more likely to have a fall. ?Follow these instructions at home: ?Lifestyle ?Do not drink alcohol if: ?Your health care provider tells you not to drink. ?If you drink alcohol: ?Limit how much you have to: ?0-1 drink a day for women. ?0-2 drinks a day for men. ?Know how much alcohol is in your drink. In the [place] one drink equals one 12 oz bottle of beer (355 mL), one 5 oz glass of wine (148 mL), or one 1? oz glass of hard liquor (44 mL). ?Do not use any products that contain nicotine or tobacco. These products include cigarettes, chewing tobacco, and vaping devices, such as e-cigarettes. If you need help  quitting, ask your health care provider. ?Activity ? ?Follow a regular exercise program to stay fit. This will help you maintain your balance. Ask your health care provider what types of exercise are appropriate for you. ?If you need a cane or walker, use it as recommended by your health care provider. ?Wear supportive shoes that have nonskid soles. ?Safety ? ?Remove any tripping hazards, such as rugs, cords, and clutter. ?Install safety  equipment such as grab bars in bathrooms and safety rails on stairs. ?Keep rooms and walkways well-lit. ?General instructions ?Talk with your health care provider about your risks for falling. Tell your health care provider if: ?You fall. Be sure to tell your health care provider about all falls, even ones that seem minor. ?You feel dizzy, tiredness (fatigue), or off-balance. ?Take over-the-counter and prescription medicines only as told by your health care provider. These include supplements. ?Eat a healthy diet and maintain a healthy weight. A healthy diet includes low-fat dairy products, low-fat (lean) meats, and fiber from whole grains, beans, and lots of fruits and vegetables. ?Stay current with your vaccines. ?Schedule regular health, dental, and eye exams. ?Summary ?Having a healthy lifestyle and getting preventive care can help to protect your health and wellness after age 52. ?Screening and testing are the best way to find a health problem early and help you avoid having a fall. Early diagnosis and treatment give you the best chance for managing medical conditions that are more common for people who are older than age 32. ?Falls are a major cause of broken bones and head injuries in people who are older than age 71. Take precautions to prevent a fall at home. ?Work with your health care provider to learn what changes you can make to improve your health and wellness and to prevent falls. ?This information is not intended to replace advice given to you by your health care provider. Make sure you discuss any questions you have with your health care provider. ?Document Revised: 12/27/2020 Document Reviewed: 12/27/2020 ?Elsevier Patient Education ? [place]. ? ?

## 2021-11-12 NOTE — Assessment & Plan Note (Signed)
Continues aspirin, statin, imdur (recently increased without improvement in chest pain)  ?

## 2021-11-12 NOTE — Assessment & Plan Note (Signed)
Repeat sleep study showed marked improvement - no longer need for CPAP. She continues nocturnal oxygen supplementation.  ?

## 2021-11-12 NOTE — Assessment & Plan Note (Signed)
Chronic, continues levothyroxine 137mg daily with 1516m twice weekly. Update TSH.  ?

## 2021-11-12 NOTE — Assessment & Plan Note (Signed)
Stable period on pepcid and omeprazole '20mg'$  alternating every other day.  ?

## 2021-11-12 NOTE — Assessment & Plan Note (Signed)
Chronic, present with minimal exertion.  ?She is also tachycardic with minimal exertion.  ?Will touch base with [facility] to see if I can order 48 hour holter monitor.  ?

## 2021-11-12 NOTE — Assessment & Plan Note (Addendum)
Chronic, only on metformin.  ?Advised call and schedule diabetic eye exam (missed appt).  ?Off actos due to concerns over CHF.  ?Update A1c.  ?

## 2021-11-12 NOTE — Assessment & Plan Note (Signed)
Stable period only on imdur. See below - start toprol XL '25mg'$  daily.  ?

## 2021-11-12 NOTE — Assessment & Plan Note (Signed)
Chronic, stable period on geodon 40gm BID. She was previously unable to establish with psychiatry.  ?

## 2021-11-12 NOTE — Assessment & Plan Note (Signed)
Chronic, on simvastatin. Update FLP.  ?The 10-year ASCVD risk score (Arnett DK, et al., 2019) is: 18.1% ?  Values used to calculate the score: ?    Age: 71 years ?    Sex: Female ?    Is Non-Hispanic African American: No ?    Diabetic: Yes ?    Tobacco smoker: No ?    Systolic Blood Pressure: 451 mmHg ?    Is BP treated: Yes ?    HDL Cholesterol: 42.8 mg/dL ?    Total Cholesterol: 131 mg/dL  ?

## 2021-11-13 DIAGNOSIS — B370 Candidal stomatitis: Secondary | ICD-10-CM | POA: Insufficient documentation

## 2021-11-13 DIAGNOSIS — R590 Localized enlarged lymph nodes: Secondary | ICD-10-CM | POA: Insufficient documentation

## 2021-11-13 DIAGNOSIS — L988 Other specified disorders of the skin and subcutaneous tissue: Secondary | ICD-10-CM | POA: Insufficient documentation

## 2021-11-13 DIAGNOSIS — L989 Disorder of the skin and subcutaneous tissue, unspecified: Secondary | ICD-10-CM | POA: Insufficient documentation

## 2021-11-13 NOTE — Assessment & Plan Note (Signed)
Notes easy tachycardia with minimal exertion - will order holter monitor for further evaluation, start toprol XL '25mg'$  daily. This may be contributing to dyspnea.  ?

## 2021-11-13 NOTE — Assessment & Plan Note (Signed)
Continue aspirin, statin.  

## 2021-11-13 NOTE — Assessment & Plan Note (Signed)
Suspect this - will Rx nystatin swish/swallow.  ?

## 2021-11-13 NOTE — Assessment & Plan Note (Signed)
Continue breztri, followed by pulm.  ?

## 2021-11-13 NOTE — Assessment & Plan Note (Addendum)
Update anemia levels off iron replacement ?

## 2021-11-13 NOTE — Assessment & Plan Note (Signed)
Minimal, rpt imaging would be due 2032?  ?

## 2021-11-13 NOTE — Assessment & Plan Note (Signed)
New - ?related to thrush. Treat then reassess. I did ask pt to let me know if ongoing lymphadenopathy over next few weeks.  ?

## 2021-11-13 NOTE — Assessment & Plan Note (Signed)
Poorly healing sore to left breast. Upcoming mammogram next month. Will refer to derm for further evaluation.  ?

## 2021-11-14 ENCOUNTER — Encounter: Payer: Self-pay | Admitting: *Deleted

## 2021-11-14 NOTE — Addendum Note (Signed)
Addended by: Ria Bush on: 11/14/2021 11:13 AM ? ? Modules accepted: Orders ? ?

## 2021-11-14 NOTE — Addendum Note (Signed)
Addended by: Virl Cagey on: 11/14/2021 11:04 AM ? ? Modules accepted: Orders ? ?

## 2021-11-15 ENCOUNTER — Encounter: Payer: Self-pay | Admitting: Family Medicine

## 2021-11-15 LAB — TSH: TSH: 1.86 u[IU]/mL (ref 0.35–5.50)

## 2021-11-24 ENCOUNTER — Ambulatory Visit: Payer: Medicare HMO

## 2021-12-05 DIAGNOSIS — J449 Chronic obstructive pulmonary disease, unspecified: Secondary | ICD-10-CM | POA: Diagnosis not present

## 2021-12-12 ENCOUNTER — Ambulatory Visit
Admission: RE | Admit: 2021-12-12 | Discharge: 2021-12-12 | Disposition: A | Discharge status: Home / Self Care | Payer: Medicare HMO | Source: Ambulatory Visit | Attending: Family Medicine | Admitting: Family Medicine

## 2021-12-12 ENCOUNTER — Encounter: Payer: Self-pay | Admitting: Family Medicine

## 2021-12-12 DIAGNOSIS — Z1231 Encounter for screening mammogram for malignant neoplasm of breast: Secondary | ICD-10-CM

## 2021-12-15 ENCOUNTER — Telehealth: Payer: Self-pay

## 2021-12-15 MED ORDER — MUPIROCIN 2 % EX OINT
1.0000 | TOPICAL_OINTMENT | Freq: Two times a day (BID) | CUTANEOUS | 0 refills | Status: DC
Start: 2021-12-15 — End: 2022-02-13

## 2021-12-15 NOTE — Progress Notes (Signed)
? ? ?Chronic Care Management ?Pharmacy Assistant  ? ?Name: [name]  MRN: [id number] DOB: 02-11-1951 ? ?Reason for Encounter: CCM Counsellor) ?  ?Medications: ?Outpatient Encounter Medications as of 12/15/2021  ?Medication Sig  ? acetaminophen (TYLENOL) 650 MG CR tablet Take 1,300 mg by mouth 2 (two) times daily.  ? albuterol (VENTOLIN HFA) 108 (90 Base) MCG/ACT inhaler Inhale 2 puffs into the lungs every 6 (six) hours as needed for wheezing or shortness of breath.  ? Apple Cider Vinegar 600 MG CAPS  (Patient not taking: Reported on 11/11/2021)  ? aspirin 81 MG EC tablet Take 1 tablet (81 mg total) by mouth daily.  ? aspirin-acetaminophen-caffeine (EXCEDRIN MIGRAINE) 250-250-65 MG tablet Take by mouth every 6 (six) hours as needed for headache.  ? [place]-Glucosamine-Vit D (OSTEO BI-FLEX ONE PER DAY PO) Take 1 tablet by mouth daily.  ? Budeson-Glycopyrrol-Formoterol (BREZTRI AEROSPHERE) 160-9-4.8 MCG/ACT AERO Inhale 2 puffs into the lungs in the morning and at bedtime.  ? cholecalciferol (VITAMIN D3) 25 MCG (1000 UNIT) tablet Take 1,000 Units by mouth daily.  ? cyclobenzaprine (FLEXERIL) 10 MG tablet TAKE 1/2 TO 1 TABLET BY MOUTH TWICE A DAY AS NEEDED FOR MUSCLE SPASMS AND MIGRAINE  ? famotidine (PEPCID) 20 MG tablet Take 20 mg by mouth every other day. Alternates with omeprazole  ? gabapentin (NEURONTIN) 100 MG capsule Take 1-3 capsules (100-300 mg total) by mouth at bedtime.  ? isosorbide mononitrate (IMDUR) 60 MG 24 hr tablet Take 1 tablet (60 mg total) by mouth daily.  ? levothyroxine (SYNTHROID) 100 MCG tablet TAKE 1 TABLET DAILY, AND ON2 DAYS A WEEK, TAKE AN     EXTRA 1/2 TABLET  ? loratadine (CLARITIN) 10 MG tablet Take 1 tablet (10 mg total) by mouth daily.  ? Melatonin 5 MG CAPS Take by mouth.  ? metFORMIN (GLUCOPHAGE) 500 MG tablet Take 2 tablets (1,000 mg total) by mouth 2 (two) times daily with a meal.  ? metoprolol succinate (TOPROL-XL) 25 MG 24 hr tablet Take 1 tablet (25 mg total) by  mouth daily.  ? Multiple Vitamin (MULTIVITAMIN WITH MINERALS) TABS tablet Take 1 tablet by mouth daily. One A Day for Women  ? mupirocin ointment (BACTROBAN) 2 % Apply 1 application. topically 2 (two) times daily.  ? naproxen sodium (ALEVE) 220 MG tablet Take 220 mg by mouth 2 (two) times daily with a meal.  ? nystatin (MYCOSTATIN) 100000 UNIT/ML suspension Take 5 mLs (500,000 Units total) by mouth 3 (three) times daily.  ? nystatin cream (MYCOSTATIN) APPLY TO AFFECTED AREA TWICE A DAY  ? omeprazole (PRILOSEC) 20 MG capsule Take 1 capsule (20 mg total) by mouth daily. Take 1 daily-Alternates with famotidine  ? ONE TOUCH LANCETS MISC Use to check sugar twice daily Dx: E11.8  ? ONETOUCH ULTRA test strip USE TO CHECK BLOOD SUGAR 2 TIMES   DAILY  ? OXYGEN Inhale 2 L into the lungs at bedtime.  ? Rhubarb (ESTROVEN COMPLETE PO) Take 1 tablet by mouth daily.  ? simvastatin (ZOCOR) 40 MG tablet TAKE 1 TABLET AT BEDTIME  ? torsemide (DEMADEX) 20 MG tablet Take 1 tablet (20 mg total) by mouth daily.  ? traMADol (ULTRAM) 50 MG tablet TAKE 1 TABLET (50 MG TOTAL) BY MOUTH TWICE A DAY AS NEEDED FOR MODERATE PAIN  ? vitamin B-12 (CYANOCOBALAMIN) 1000 MCG tablet Take 1,000 mcg by mouth daily.  ? ziprasidone (GEODON) 40 MG capsule TAKE 1 CAPSULE EVERY       MORNING AND AT BEDTIME  ? ?  No facility-administered encounter medications on file as of 12/15/2021.  ? ?[name] was contacted to remind of upcoming telephone visit with Charlene Brooke on 12/20/2021 at 3:45. Patient was reminded to have any blood glucose and blood pressure readings available for review at appointment.  ? ?Patient confirmed appointment. ? ?Are you having any problems with your medications? No  ? ?Do you have any concerns you like to discuss with the pharmacist? No ? ?Star Rating Drugs: ?Medication:  Last Fill: Day Supply ?Simvastatin 40 mg 10/17/2021 90 ?Metformin 500 mg 10/24/2021 90 ? ?Charlene Brooke, CPP notified ? ?Marijean Niemann, RMA ?Clinical Pharmacy  Assistant ?[phone] ? ?  ? ? ? ?

## 2021-12-15 NOTE — Telephone Encounter (Signed)
See pt note regarding holter monitor - she never received it.  ?

## 2021-12-19 ENCOUNTER — Other Ambulatory Visit: Payer: Self-pay | Admitting: Family Medicine

## 2021-12-19 DIAGNOSIS — Z794 Long term (current) use of insulin: Secondary | ICD-10-CM

## 2021-12-20 ENCOUNTER — Ambulatory Visit (INDEPENDENT_AMBULATORY_CARE_PROVIDER_SITE_OTHER): Payer: Medicare HMO | Admitting: Pharmacist

## 2021-12-20 DIAGNOSIS — J449 Chronic obstructive pulmonary disease, unspecified: Secondary | ICD-10-CM

## 2021-12-20 DIAGNOSIS — F3178 Bipolar disorder, in full remission, most recent episode mixed: Secondary | ICD-10-CM

## 2021-12-20 DIAGNOSIS — E1169 Type 2 diabetes mellitus with other specified complication: Secondary | ICD-10-CM

## 2021-12-20 DIAGNOSIS — I10 Essential (primary) hypertension: Secondary | ICD-10-CM

## 2021-12-20 DIAGNOSIS — B370 Candidal stomatitis: Secondary | ICD-10-CM

## 2021-12-20 DIAGNOSIS — I25118 Atherosclerotic heart disease of native coronary artery with other forms of angina pectoris: Secondary | ICD-10-CM

## 2021-12-20 DIAGNOSIS — Z794 Long term (current) use of insulin: Secondary | ICD-10-CM

## 2021-12-20 NOTE — Patient Instructions (Signed)
Visit Information ? ?Phone number for Pharmacist: [phone] ? ? Goals Addressed   ?None ?  ? ? ?Care Plan : [place]  ?Updates made by Charlton Haws, RPH since 12/20/2021 12:00 AM  ?  ? ?Problem: Hypertension, Hyperlipidemia, Diabetes, Coronary Artery Disease, COPD, Osteoarthritis, and Bipolar disorder   ?Priority: High  ?  ? ?Long-Range Goal: Disease management   ?Start Date: 05/11/2021  ?Expected End Date: 12/21/2022  ?This Visit's Progress: On track  ?Recent Progress: On track  ?Priority: High  ?Note:   ?Current Barriers:  ?Suboptimal inhaler technique (developed thrush) ?Unable to obtain Holter monitor ? ?Pharmacist Clinical Goal(s):  ?Patient will achieve adherence to monitoring guidelines and medication adherence to achieve therapeutic efficacy ?adhere to plan to optimize therapeutic regimen for diabetes as evidenced by report of adherence to recommended medication management changes through collaboration with PharmD and provider.  ? ?Interventions: ?1:1 collaboration with Ria Bush, MD regarding development and update of comprehensive plan of care as evidenced by provider attestation and co-signature ?Inter-disciplinary care team collaboration (see longitudinal plan of care) ?Comprehensive medication review performed; medication list updated in electronic medical record ? ?Hypertension (BP goal <130/80) ?-Controlled - BP is at goal home, on avg; pt reports marked improvement in chest pain since starting metoprolol, but she has not received Holter monitor yet (ordered 10/2021) ?-ECHO 05/2020 - LVEF 40% and Grade 1 diastolic dysfunction (prior to CABG) ?-Denies hypotensive/hypertensive symptoms ?-Current home readings: 126/58, 114/59, 145/77, 132/69, 133/72, 127/69 ?-Current treatment: ?Isosorbide MN 30 mg daily - Appropriate, Effective, Safe, Accessible ?Torsemide 20 mg daily - Appropriate, Effective, Safe, Accessible ?Metoprolol succinate 25 mg daily - Appropriate, Effective, Safe,  Accessible ?-Educated on BP goals and benefits of medications for prevention of heart attack, stroke and kidney damage; Daily salt intake goal < 2300 mg; ?-Counseled that torsemide was added by cardiologist to help with SOB ?-Counseled to monitor BP at home daily ?-Recommended to continue current medication ? ?Hyperlipidemia: (LDL goal < 70) ?-Controlled - LDL is at goal; pt endorses compliance with statin and denies issues ?-Hx CAD, CABG 2012, 07/2020; [place] 11/09/2020 with no significant ischemia, fixed apical defect. ?-Current treatment: ?Simvastatin 40 mg daily - Appropriate, Effective, Safe, Accessible ?Aspirin 81 mg daily -Appropriate, Effective, Safe, Accessible ?-Educated on Cholesterol goals; Benefits of statin for ASCVD risk reduction; ?-Recommended to continue current medication ? ?Diabetes (A1c goal <7%) ?-Controlled - A1c 6.6% (10/2021) at goal;  pt endorses compliance with metformin ?-Current home glucose readings ?fasting glucose: highest 197, mostly 129,130, 142, 149, 138 ?post prandial glucose: 157, 162, 171, 142, 150, 215 (cheeseburger, beans) ?-Denies hypoglycemic/hyperglycemic symptoms ?-Current medications: ?Metformin 500 mg -2 tab BID -Appropriate, Effective, Safe, Accessible ?Testing supplies ?-Medications previously tried: Novolin N, glimepiride, pioglitazone ?-Current meal patterns:  ?dinner: pizza once a week, hamburgers (no buns), salad, spaghetti every 1-2 weeks, beefaroni  ?drinks: diet soda, water (4-5 bottles per day) ?-Current exercise: step exercises - 4000 steps/day (limited d/t knees, SOB) ?-Recommend to continue current medication ? ?COPD (Goal: control symptoms and prevent exacerbations) ?-Controlled - Pt notes improvement since taking Breztri now that she gets it free through PAP; she did develop thrush recently and was treated with nystatin rinse, initially thrush improved but it does keep returning ?-Gold Grade: Gold 1 (FEV1>80%) ?-Current COPD Classification:  A (low  sx, <2 exacerbations/yr) ?-MMRC/CAT score: not on file ?-Pulmonary function testing: 04/20/20 - FEV1 84% predicted, FEV1/FVC 0.76 ?-Exacerbations requiring treatment in last 6 months: 0 ?-Current treatment  ?Breztri 160-9-4.8  mcg/act 2 puffs BID (PAP) -Appropriate, Effective, Safe, Accessible ?Albuterol HFA prn - Appropriate, Effective, Safe, Accessible ?Oxygen 2L HS ?-Medications previously tried: Trelegy ($$), Anoro, Symbciort  ?-Patient denies consistent use of maintenance inhaler ?-Frequency of rescue inhaler use: every other day ?-Counseled pt is enrolled in [place] through 08/20/2022, at end of 2023 we can re-apply for following year ?-Counseled Breztri contained ICS which can cause thrush; advised to rinse mouth with mouthwash following every use of Breztri ?-Recommend to continue current medication ? ?Bipolar disorder (Goal: manage symptoms) ?-Controlled - Pt reports "ziprasidone is a life saver"; she never wants to change this med ?-Current treatment: ?Ziprasidone 40 mg BID -Appropriate, Effective, Safe, Accessible ?-Medications previously tried/failed: quetiapine, diazepam ?-Connected with PCP for mental health support ?-Recommended to continue current medication ? ?Migraines (Goal: prevent episodes) ?-Not ideally controlled - pt stopped Ajovy months ago because it was not helping and it was very expensive ?-Pt has appt 07/19/21 but had to cancel due to husband's health issues ?-Current treatment  ?Cyclobenzaprine 10 mg PRN -Appropriate, Query Effective ?Excedrin PRN (APAP/caffeine/ASA) - Appropriate, Query Effective ?-Medications previously tried: Nurtec, topamax, Ajovy ?-Discussed that there are other medications available for migraine prevention that can be tried; pt to discuss her concerns with her neurologist ? ?Pain (Goal: manage symptoms) ?-Not ideally controlled - pt reports gel injections in her knee were not helpful; she is meant to follow up with ortho for next steps but will have a hard time  getting to any appts in immediate future due to husband's poor health ?-Pt requests refill for tramadol - she uses very seldom but back pain has been worse lately due to caring for her husband and sleeping on couch while he is in living room in hospital bed;  ?-osteoarthritis of knee, cervical pain, fibromyalgia ?-Current treatment  ?Cyclobenzaprine 10 mg PRN -Appropriate, Query Effective ?Gabapentin 100 mg 1-3 cap HS -Appropriate, Query Effective ?Naproxen - 1 tab BID - Appropriate, Effective,  Query Safe ?Tylenol 650 mg - 2 tab BID -Appropriate, Query Effective ?Tramadol 50 mg PRN (last rx 08/11/21) -Appropriate, Query Effective ?-Counseled on benefits/risks of increasing gabapentin, advised it is safe to try higher dose ?-Counseled on risks of chronic NSAID use -  advised her to use the lowest effective dose of NSAID to control her pain in order to reduce CV and kidney risks ? ?Health Maintenance ?-Vaccine gaps: TDAP ? ?Patient Goals/Self-Care Activities ?Patient will:  ?- take medications as prescribed ?focus on medication adherence by pill box ?-check glucose twice a day, document, and provide at future appointments ?-check blood pressure daily, document, and provide at future appointments ?-Contact cardiology regarding Holter monitor ?-Rinse mouth with mouthwash after every Breztri use ?  ?  ? ?Patient verbalizes understanding of instructions and care plan provided today and agrees to view in [place]. Active MyChart status confirmed with patient.   ?The pharmacy team will reach out to the patient again over the next 90 days.  ? ?Charlene Brooke, PharmD, BCACP ?Clinical Pharmacist ?[facility] ?[phone] ?  ?

## 2021-12-20 NOTE — Progress Notes (Signed)
? ?Chronic Care Management ?Pharmacy Note ? ?12/20/2021 ?Name:  [name] MRN:  [id number] DOB:  02-12-1951 ? ?Summary: CCM F/U visit ?-DM: A1c 6.6%; on metformin only. Fasting avg 120-150, post-prandial avg < 180 ?-COPD - on Breztri, she did have thrush recently and admits to not rinsing her mouth out after every dose. ?-HF/CAD - pt reports improvement in chest pains since starting metoprolol; she has not received Holter monitor from cardiology yet ? ?Recommendations/Changes made from today's visit: ?-Advised to rinse mouth with mouthwash after each Breztri use ?-Advised pt to call cardiology office regarding Holter monitor shipment ? ?Plan: ?-[facility] will call patient 3 months for DM update ?-Pharmacist follow up televisit scheduled for 6 months ?-PCP 02/13/22 ? ? ? ?Subjective: ?[name] is an 71 y.o. year old female who is a primary patient of Ria Bush, MD.  The CCM team was consulted for assistance with disease management and care coordination needs.   ? ?Engaged with patient by telephone for follow up visit in response to provider referral for pharmacy case management and/or care coordination services.  ? ?Consent to Services:  ?The patient was given information about Chronic Care Management services, agreed to services, and gave verbal consent prior to initiation of services.  Please see initial visit note for detailed documentation.  ? ?Patient Care Team: ?Ria Bush, MD as PCP - General (Family Medicine) ?Kate Sable, MD as PCP - Cardiology (Cardiology) ?Bryson Ha, OD as Consulting Physician (Optometry) ?Alazne Quant, Cleaster [facility], [facility] as Pharmacist (Pharmacist) ? ?Patient is from [place], she has been in [place] for over 20 years. Her daughter still lives in [place], 2 brothers in [place], 1 brother in [place], sister in [place]. Pt is on disability x 10 years. Her husband is retired in his 60s. She is "not very mobile", gets out for doctors visits, grocery store. She uses walker to  get around.  ? ?08/10/21: Pt's husband just got home from prolonged hospital/SNF stay and is bed-bound, patient is main caregiver for him and cannot leave him alone. She will struggle to get to appts for a while as there is no one else who can sit with him during the day. ? ?Recent office visits: ?11/11/21 Dr Danise Mina OV: AWV - order heart monitor. Try Toprol XL 25 mg daily. Take Nystatin for thrush. Schedule DM eye exam. Refer to dermatology for breast wound. F/U 3 months. ? ?03/30/21 Danise Mina (PCP) - Controlled type 2 diabetes mellitus with complication. Start Gabapentin for paresthesias D/c Topiramate. ?  ?01/10/21 Danise Mina (PCP) - Intractable migraine with status migrainosus. No med changes. Given Dexamethasone/ketorolac/promethazine injection in clinic. Ordered Nurtec. ?  ?12/27/20 Danise Mina (PCP) - Controlled type 2 diabetes mellitus with complication. Stop Novolin N due to low A1c, hypoglycemia. ? ?Recent consult visits: ?10/20/21 Dr Garen Lah (Cardiology): f/u CAD - stable angina. Increase Imdur to 60 mg. ? ?06/23/21 Dr Patsey Berthold (pulmonary): f/u COPD; resume Breztri. SOB mostly related to obesity, deconditioning, diastolic dysfunction. No need for CPAP. ? ?04/21/21 Agbor-Etang (Cardiology) - F/U CAD. Chest discomfort improved with higher dose of Imdur. No med changes. ?  ?03/07/21 Jaynee Eagles (Neurology) - Chronic migraine without aura, with intractable migraine. Start Ajovy 225 mg. ?  ?7/03/28/21 Agbor-Etang (Cardiology) - F/U CAD. Start Torsemide. Increase Isosorbide to 30 mg. ? ?02/24/21 Patsey Berthold (Pulmonary) - Stage 1 mild COPD by GOLD classification. Start Trelegy. D/c Novolin & Anoro Ellipta. (Changed to [facility] 7/25 due to cost) ? ?Hospital visits: ?None in previous 6 months ? ? ?Objective: ? ?  Lab Results  ?Component Value Date  ? CREATININE 0.94 11/11/2021  ? BUN 16 11/11/2021  ? GFR 62.06 11/11/2021  ? GFRNONAA >60 03/04/2021  ? GFRAA 80 08/26/2020  ? NA 142 11/11/2021  ? K 4.8 11/11/2021  ? CALCIUM 9.4 11/11/2021   ? CO2 27 11/11/2021  ? GLUCOSE 139 (H) 11/11/2021  ? ? ?Lab Results  ?Component Value Date/Time  ? HGBA1C 6.6 (H) 11/11/2021 10:37 AM  ? HGBA1C 6.0 (A) 03/30/2021 09:58 AM  ? HGBA1C 5.6 12/27/2020 10:58 AM  ? HGBA1C 5.9 (H) 07/30/2020 12:00 PM  ? GFR 62.06 11/11/2021 10:37 AM  ? GFR 86.14 09/27/2020 11:17 AM  ? MICROALBUR 1.6 11/11/2021 10:37 AM  ? MICROALBUR 2.4 (H) 12/27/2020 11:54 AM  ?  ?Last diabetic Eye exam:  ?Lab Results  ?Component Value Date/Time  ? HMDIABEYEEXA No Retinopathy 07/08/2020 12:00 AM  ?  ?Last diabetic Foot exam:  ?Lab Results  ?Component Value Date/Time  ? HMDIABFOOTEX done 02/25/2013 12:00 AM  ?  ? ?Lab Results  ?Component Value Date  ? CHOL 126 11/11/2021  ? HDL 38.50 (L) 11/11/2021  ? [place] 48 12/08/2013  ? LDLDIRECT 53.0 11/11/2021  ? TRIG 297.0 (H) 11/11/2021  ? CHOLHDL 3 11/11/2021  ? ? ? ?  Latest Ref Rng & Units 11/11/2021  ? 10:37 AM 09/14/2020  ?  1:15 PM 08/02/2020  ? 11:30 AM  ?Hepatic Function  ?Total Protein 6.0 - 8.3 g/dL 7.1   7.3   7.3    ?Albumin 3.5 - 5.2 g/dL 4.5   4.1   3.8    ?AST 0 - 37 U/L _0 ?ALT 0 - 35 U/L _1 ?Alk Phosphatase 39 - 117 U/L 51   65   40    ?Total Bilirubin 0.2 - 1.2 mg/dL 0.2   0.3   0.6    ? ? ?Lab Results  ?Component Value Date/Time  ? TSH 1.86 11/11/2021 10:37 AM  ? TSH 1.37 09/14/2020 01:15 PM  ? FREET4 0.94 04/10/2017 09:53 AM  ? FREET4 0.79 03/30/2016 08:52 AM  ? ? ? ?  Latest Ref Rng & Units 11/11/2021  ? 10:37 AM 02/25/2021  ? 10:36 AM 10/27/2020  ?  9:55 AM  ?CBC  ?WBC 4.0 - 10.5 K/uL 12.1   11.7   17.9    ?Hemoglobin 12.0 - 15.0 g/dL 10.9   10.7   10.8    ?Hematocrit 36.0 - 46.0 % 34.2   33.1   33.5    ?Platelets 150.0 - 400.0 K/uL 424.0   427   400    ? ? ?No results found for: VD25OH ? ?Clinical ASCVD: Yes  ?The ASCVD Risk score (Arnett DK, et al., 2019) failed to calculate for the following reasons: ?  The valid total cholesterol range is 130 to 320 mg/dL   ? ? ?  11/11/2021  ? 10:42 AM 11/23/2020  ?  1:16 PM 10/11/2020  ?   4:17 PM  ?Depression screen PHQ 2/9  ?Decreased Interest 0 0 1  ?Down, Depressed, Hopeless 0 0 0  ?PHQ - 2 Score 0 0 1  ?Altered sleeping 0 0 1  ?Tired, decreased energy 1 0 1  ?Change in appetite 0 0 2  ?Feeling bad or failure about yourself  0 0 0  ?Trouble concentrating 0 0 0  ?Moving slowly or fidgety/restless 0 0 0  ?Suicidal thoughts  0 0 0  ?PHQ-9 Score 1 0 5  ?Difficult doing work/chores  Not difficult at all Not difficult at all  ?  ? ?  11/11/2021  ? 10:42 AM 05/26/2020  ? 11:22 AM  ?GAD 7 : Generalized Anxiety Score  ?Nervous, Anxious, on Edge 0 0  ?Control/stop worrying 0 0  ?Worry too much - different things 0 0  ?Trouble relaxing 0 0  ?Restless 0 0  ?Easily annoyed or irritable 0 0  ?Afraid - awful might happen 0 0  ?Total GAD 7 Score 0 0  ? ? ? ?Social History  ? ?Tobacco Use  ?Smoking Status Former  ? Packs/day: 2.00  ? Years: 43.00  ? Pack years: 86.00  ? Types: Cigarettes  ? Quit date: 05/17/2010  ? Years since quitting: 11.6  ?Smokeless Tobacco Never  ?Tobacco Comments  ? QUIT 2011  ? ?BP Readings from Last 3 Encounters:  ?11/11/21 124/62  ?10/20/21 114/60  ?06/23/21 130/70  ? ?Pulse Readings from Last 3 Encounters:  ?11/11/21 95  ?10/20/21 89  ?06/23/21 91  ? ?Wt Readings from Last 3 Encounters:  ?11/11/21 223 lb (101.2 kg)  ?10/20/21 217 lb (98.4 kg)  ?06/23/21 236 lb 6.4 oz (107.2 kg)  ? ?BMI Readings from Last 3 Encounters:  ?11/11/21 41.12 kg/m?  ?10/20/21 39.69 kg/m?  ?06/23/21 43.24 kg/m?  ? ? ?Assessment/Interventions: Review of patient past medical history, allergies, medications, health status, including review of consultants reports, laboratory and other test data, was performed as part of comprehensive evaluation and provision of chronic care management services.  ? ?SDOH:  (Social Determinants of Health) assessments and interventions performed: Yes ? ?SDOH Screenings  ? ?Alcohol Screen: Not on file  ?Depression (PHQ2-9): Low Risk   ? PHQ-2 Score: 1  ?Financial Resource Strain: Not on  file  ?Food Insecurity: Not on file  ?Housing: Not on file  ?Physical Activity: Not on file  ?Social Connections: Not on file  ?Stress: Not on file  ?Tobacco Use: Medium Risk  ? Smoking Tobacco Use: Former

## 2021-12-22 MED ORDER — NYSTATIN 100000 UNIT/GM EX CREA: TOPICAL_CREAM | CUTANEOUS | 1 refills | Status: DC

## 2021-12-22 NOTE — Addendum Note (Signed)
Addended by: Ria Bush on: 12/22/2021 11:22 AM ? ? Modules accepted: Orders ? ?

## 2021-12-22 NOTE — Progress Notes (Addendum)
Nystatin refilled per pt request.  ?Was unable to do mammogram due to sore.  ?Derm appt not til end of the year.  ?I've asked her to return for re evaluation of breast sore.  ?

## 2021-12-29 ENCOUNTER — Encounter: Payer: Self-pay | Admitting: Family Medicine

## 2022-01-04 DIAGNOSIS — J449 Chronic obstructive pulmonary disease, unspecified: Secondary | ICD-10-CM | POA: Diagnosis not present

## 2022-01-08 ENCOUNTER — Other Ambulatory Visit: Payer: Self-pay | Admitting: Family Medicine

## 2022-01-09 NOTE — Telephone Encounter (Signed)
Name of Medication: Tramadol Name of Pharmacy: [facility] Last Fill or Written Date and Quantity: 11/08/21, #30 Last Office Visit and Type: 11/11/21, AWV Next Office Visit and Type: 02/13/22, 3 mo f/u Last Controlled Substance Agreement Date: none Last UDS: none  Metoprolol XL last filled: 12/08/21, #30

## 2022-01-12 NOTE — Telephone Encounter (Signed)
ERx 

## 2022-01-12 NOTE — Telephone Encounter (Signed)
See 01/08/22 Refill note

## 2022-01-18 DIAGNOSIS — E785 Hyperlipidemia, unspecified: Secondary | ICD-10-CM

## 2022-01-18 DIAGNOSIS — R69 Illness, unspecified: Secondary | ICD-10-CM | POA: Diagnosis not present

## 2022-01-18 DIAGNOSIS — Z87891 Personal history of nicotine dependence: Secondary | ICD-10-CM

## 2022-01-18 DIAGNOSIS — I2510 Atherosclerotic heart disease of native coronary artery without angina pectoris: Secondary | ICD-10-CM

## 2022-01-18 DIAGNOSIS — Z7984 Long term (current) use of oral hypoglycemic drugs: Secondary | ICD-10-CM | POA: Diagnosis not present

## 2022-01-18 DIAGNOSIS — F319 Bipolar disorder, unspecified: Secondary | ICD-10-CM

## 2022-01-18 DIAGNOSIS — J449 Chronic obstructive pulmonary disease, unspecified: Secondary | ICD-10-CM

## 2022-01-18 DIAGNOSIS — I10 Essential (primary) hypertension: Secondary | ICD-10-CM | POA: Diagnosis not present

## 2022-01-18 DIAGNOSIS — E1169 Type 2 diabetes mellitus with other specified complication: Secondary | ICD-10-CM | POA: Diagnosis not present

## 2022-01-20 ENCOUNTER — Encounter: Payer: Self-pay | Admitting: Cardiology

## 2022-01-20 ENCOUNTER — Ambulatory Visit: Payer: Medicare HMO | Admitting: Cardiology

## 2022-01-20 VITALS — BP 110/60 | HR 83 | Ht 62.0 in | Wt 224.6 lb

## 2022-01-20 DIAGNOSIS — E785 Hyperlipidemia, unspecified: Secondary | ICD-10-CM | POA: Diagnosis not present

## 2022-01-20 DIAGNOSIS — I25118 Atherosclerotic heart disease of native coronary artery with other forms of angina pectoris: Secondary | ICD-10-CM | POA: Diagnosis not present

## 2022-01-20 MED ORDER — OZEMPIC (0.25 OR 0.5 MG/DOSE) 2 MG/1.5ML ~~LOC~~ SOPN: 0.2500 mg | PEN_INJECTOR | SUBCUTANEOUS | 0 refills | Status: DC

## 2022-01-20 MED ORDER — OZEMPIC (1 MG/DOSE) 2 MG/1.5ML ~~LOC~~ SOPN: 1.0000 mg | PEN_INJECTOR | SUBCUTANEOUS | 0 refills | Status: DC

## 2022-01-20 MED ORDER — OZEMPIC (0.25 OR 0.5 MG/DOSE) 2 MG/1.5ML ~~LOC~~ SOPN: 0.5000 mg | PEN_INJECTOR | SUBCUTANEOUS | 0 refills | Status: DC

## 2022-01-20 MED ORDER — OZEMPIC (2 MG/DOSE) 8 MG/3ML ~~LOC~~ SOPN: 2.0000 mg | PEN_INJECTOR | SUBCUTANEOUS | 5 refills | Status: DC

## 2022-01-20 NOTE — Patient Instructions (Signed)
Medication Instructions:   Your physician has recommended you make the following change in your medication:   START taking Ozempic:  Inject 0.25 MG into skin once a week, for 4 weeks.   Inject 0.50 MG into skin once a week, for 4 weeks.   Inject 1.0 MG into skin once a week, for 4 weeks.   Inject 2.0 MG into skin once a week, and continue at this dose.   *If you need a refill on your cardiac medications before your next appointment, please call your pharmacy*   Lab Work:  None ordered   Testing/Procedures:  None ordered   Follow-Up: At [facility], you and your health needs are our priority.  As part of our continuing mission to provide you with exceptional heart care, we have created designated Provider Care Teams.  These Care Teams include your primary Cardiologist (physician) and Advanced Practice Providers (APPs -  Physician Assistants and Nurse Practitioners) who all work together to provide you with the care you need, when you need it.  We recommend signing up for the patient portal called "MyChart".  Sign up information is provided on this After Visit Summary.  MyChart is used to connect with patients for Virtual Visits (Telemedicine).  Patients are able to view lab/test results, encounter notes, upcoming appointments, etc.  Non-urgent messages can be sent to your provider as well.   To learn more about what you can do with MyChart, go to [email or web address].    Your next appointment:   6 month(s)  The format for your next appointment:   In Person  Provider:   Kate Sable, MD    Other Instructions   Important Information About Sugar

## 2022-01-20 NOTE — Progress Notes (Signed)
Cardiology Office Note:    Date:  01/20/2022   ID:  [name], DOB 11/18/1950, MRN [id number]  PCP:  Ria Bush, MD  [facility] Cardiologist:  Kate Sable, MD  [place] Electrophysiologist:  None   Referring MD: Ria Bush, MD   No chief complaint on file.   History of Present Illness:    [name] is a 71 y.o. female with a hx of CAD/CABG x 1 (LIMA-LAD, 07/2020), hypertension, hyperlipidemia, diabetes, former smoker x40 years, COPD, OSA, morbid obesity who presents for follow-up.  Being seen for CAD, and chronic stable angina.  Previously started on Imdur 60 mg daily for antianginal benefit.  Followed up with PCP, Toprol-XL 25 mg was added.  States chest pain is much improved, rarely has any episodes.  Tolerating medications as prescribed.  Has shortness of breath with exertion, she is trying to lose weight without much success.  Prior notes Intraoperative TEE, EF approximately 50%. Right and left heart cath 06/2020 single-vessel CAD sequential 70% ostial, 90% mid LAD. CAD/CABG x1 LIMA to LAD 07/2020 Lexiscan Myoview 11/09/2020 with no significant ischemia, fixed apical defect.   Past Medical History:  Diagnosis Date   Anxiety    Arthritis    per patient "all over body"   Bipolar disorder [facility])    has stopped seeing psychiatrist   CAD (coronary artery disease)    a. 06/2020 MV: small, sev, apical inf/apical defect-scar vs ischemia. EF 45%; b. 06/2020 Cath: LM 67m LAD 70ost, 920mLCX nl, RCA nl; c. 07/2020 CABG x 1: LIMA->LAD.   Cataract    Chronic bronchitis    COPD (chronic obstructive pulmonary disease) (HCC)    Coronary artery disease, non-occlusive    Depression    Dyspnea 2012   s/p pulm/cards w/u WNL, thought due to obesity/deconditioning   Fibromyalgia    GERD (gastroesophageal reflux disease)    HFmrEF (heart failure with mid-range ejection fraction) ([facility]   a. 05/2020 Echo: EF 40%, glob HK, mild LVH, Gr1 DD; b. 06/2020  EF by SPECT: 45%; c. 07/2020 intraop TEE: EF 50%.   History of chicken pox    HLD (hyperlipidemia)    Hypertension    Hypothyroidism    Ischemic cardiomyopathy    a. 05/2020 Echo: EF 40%; b. 06/2020 EF by SPECT: 45%; c. 07/2020 intraop TEE: EF 50%.   Migraine    OSA (obstructive sleep apnea) 03/16/2011   Pneumonia    Sleep apnea    per patient cannot tolerate CPAP   T2DM (type 2 diabetes mellitus) ([facility] [place]   DM education 06/2011   Urinary incontinence     Past Surgical History:  Procedure Laterality Date   BREAST CYST ASPIRATION Right 2003   cardiac catherization  03/2011   x3 ([place] [place] mild nonobstructive CAD   COLONOSCOPY  12/2011   hyperplastic polyp x2,. diverticulosis, rec rpt 10 yrs   CORONARY ARTERY BYPASS GRAFT N/A 08/03/2020   Procedure: CORONARY ARTERY BYPASS GRAFTING TIMES ONE USING LEFT INTERNAL MAMMARY ARTERY.;  Surgeon: BaGaye PollackMD;  Location: [facility] OR;  Service: Open Heart Surgery;  Laterality: N/A;   ESOPHAGOGASTRODUODENOSCOPY  2006   ESOPHAGOGASTRODUODENOSCOPY  05/2020   no esophageal abnormality to explain dysphagia - esophagus dilated. Erosive gastropathy - neg H pylori ([place] [facility]  KNEE SURGERY  2006   left, torn meniscus   RIGHT/LEFT HEART CATH AND CORONARY ANGIOGRAPHY N/A 07/13/2020   Procedure: RIGHT/LEFT HEART CATH AND CORONARY ANGIOGRAPHY;  Surgeon: EnNelva BushMD;  Location:  [place] [facility];  Service: Cardiovascular;  Laterality: N/A;   TEE WITHOUT CARDIOVERSION N/A 08/03/2020   Procedure: TRANSESOPHAGEAL ECHOCARDIOGRAM (TEE);  Surgeon: Gaye Pollack, MD;  Location: [place];  Service: Open Heart Surgery;  Laterality: N/A;   TOTAL ABDOMINAL HYSTERECTOMY  2004   fibroids, cervix remained   TOTAL KNEE ARTHROPLASTY Left 08/2012   [name],  ortho    Current Medications: Current Meds  Medication Sig   acetaminophen (TYLENOL) 650 MG CR tablet Take 1,300 mg by mouth 2 (two) times daily.   albuterol (VENTOLIN HFA) 108 (90 Base)  MCG/ACT inhaler Inhale 2 puffs into the lungs every 6 (six) hours as needed for wheezing or shortness of breath.   Apple Cider Vinegar 600 MG CAPS    aspirin 81 MG EC tablet Take 1 tablet (81 mg total) by mouth daily.   aspirin-acetaminophen-caffeine (EXCEDRIN MIGRAINE) 250-250-65 MG tablet Take by mouth every 6 (six) hours as needed for headache.   Boswellia-Glucosamine-Vit D (OSTEO BI-FLEX ONE PER DAY PO) Take 1 tablet by mouth daily.   Budeson-Glycopyrrol-Formoterol (BREZTRI AEROSPHERE) 160-9-4.8 MCG/ACT AERO Inhale 2 puffs into the lungs in the morning and at bedtime.   cholecalciferol (VITAMIN D3) 25 MCG (1000 UNIT) tablet Take 1,000 Units by mouth daily.   cyclobenzaprine (FLEXERIL) 10 MG tablet TAKE 1/2 TO 1 TABLET BY MOUTH TWICE A DAY AS NEEDED FOR MUSCLE SPASMS AND MIGRAINE   famotidine (PEPCID) 20 MG tablet Take 20 mg by mouth every other day. Alternates with omeprazole   gabapentin (NEURONTIN) 100 MG capsule TAKE 1 TO 3 CAPSULES AT    BEDTIME   levothyroxine (SYNTHROID) 100 MCG tablet TAKE 1 TABLET DAILY, AND ON2 DAYS A WEEK, TAKE AN     EXTRA 1/2 TABLET   loratadine (CLARITIN) 10 MG tablet Take 1 tablet (10 mg total) by mouth daily.   Melatonin 5 MG CAPS Take by mouth.   metFORMIN (GLUCOPHAGE) 500 MG tablet TAKE 2 TABLETS TWO TIMES A DAY WITH MEALS   metoprolol succinate (TOPROL-XL) 25 MG 24 hr tablet TAKE 1 TABLET (25 MG TOTAL) BY MOUTH DAILY.   Multiple Vitamin (MULTIVITAMIN WITH MINERALS) TABS tablet Take 1 tablet by mouth daily. One A Day for Women   mupirocin ointment (BACTROBAN) 2 % Apply 1 application. topically 2 (two) times daily.   naproxen sodium (ALEVE) 220 MG tablet Take 220 mg by mouth 2 (two) times daily with a meal.   nystatin cream (MYCOSTATIN) APPLY TO AFFECTED AREA TWICE A DAY   omeprazole (PRILOSEC) 20 MG capsule Take 1 capsule (20 mg total) by mouth daily. Take 1 daily-Alternates with famotidine   ONE TOUCH LANCETS MISC Use to check sugar twice daily Dx: E11.8    ONETOUCH ULTRA test strip USE TO CHECK BLOOD SUGAR 2 TIMES   DAILY   OXYGEN Inhale 2 L into the lungs at bedtime.   Rhubarb (ESTROVEN COMPLETE PO) Take 1 tablet by mouth daily.   Semaglutide, 1 MG/DOSE, (OZEMPIC, 1 MG/DOSE,) 2 MG/1.5ML SOPN Inject 1 mg into the skin once a week. For 4 weeks.   Semaglutide, 2 MG/DOSE, (OZEMPIC, 2 MG/DOSE,) 8 MG/3ML SOPN Inject 2 mg into the skin once a week.   Semaglutide,0.25 or 0.'5MG'$ /DOS, (OZEMPIC, 0.25 OR 0.5 MG/DOSE,) 2 MG/1.5ML SOPN Inject 0.25 mg into the skin once a week. For 4 weeks.   Semaglutide,0.25 or 0.'5MG'$ /DOS, (OZEMPIC, 0.25 OR 0.5 MG/DOSE,) 2 MG/1.5ML SOPN Inject 0.5 mg into the skin once a week. For 4 weeks.   simvastatin (ZOCOR)  40 MG tablet TAKE 1 TABLET AT BEDTIME   torsemide (DEMADEX) 20 MG tablet Take 1 tablet (20 mg total) by mouth daily.   traMADol (ULTRAM) 50 MG tablet TAKE 1 TABLET (50 MG TOTAL) BY MOUTH TWICE A DAY AS NEEDED FOR MODERATE PAIN   vitamin B-12 (CYANOCOBALAMIN) 1000 MCG tablet Take 1,000 mcg by mouth daily.   ziprasidone (GEODON) 40 MG capsule TAKE 1 CAPSULE EVERY       MORNING AND AT BEDTIME     Allergies:   Crestor [rosuvastatin], Lipitor [atorvastatin], Lithium, and Quetiapine   Social History   Socioeconomic History   Marital status: Married    Spouse name: Not on file   Number of children: Not on file   Years of education: Not on file   Highest education level: Not on file  Occupational History   Occupation: disabled    Employer: OTHER  Tobacco Use   Smoking status: Former    Packs/day: 2.00    Years: 43.00    Pack years: 86.00    Types: Cigarettes    Quit date: 05/17/2010    Years since quitting: 11.6   Smokeless tobacco: Never   Tobacco comments:    QUIT 2011  Vaping Use   Vaping Use: Never used  Substance and Sexual Activity   Alcohol use: No    Alcohol/week: 0.0 standard drinks   Drug use: No   Sexual activity: Yes    Partners: Male  Other Topics Concern   Not on file  Social History  Narrative   Caffeine: 2-3 coffee/am, 1 cup tea in afternoon; Lives with husband, 1 dog, 2 cats; Occupation: disability since 2009 for bipolar, previously worked at [employer]; Diet: not many fruits, good vegetables, good amt water.      Smoker 43 years; quit in 2011. No alcohol. Lives in [place]; with husband.    Social Determinants of Health   Financial Resource Strain: Not on file  Food Insecurity: Not on file  Transportation Needs: Not on file  Physical Activity: Not on file  Stress: Not on file  Social Connections: Not on file     Family History: The patient's family history includes Acute lymphoblastic leukemia (age of onset: 30) in her cousin; Aneurysm in her maternal uncle; Arthritis in her mother; Asthma in her maternal uncle; Breast cancer in her maternal aunt; COPD in her sister; Cancer in her maternal uncle and paternal uncle; Cancer (age of onset: 56) in her father; Cancer (age of onset: 86) in her maternal aunt; Colon cancer (age of onset: 86) in her maternal uncle; Diabetes in her father; Hypertension in her mother; Stroke in her mother; Thyroid disease in her father. There is no history of Coronary artery disease, Stomach cancer, Rectal cancer, or Esophageal cancer.  ROS:   Please see the history of present illness.     All other systems reviewed and are negative.  EKGs/Labs/Other Studies Reviewed:    The following studies were reviewed today:   EKG:  EKG not ordered today.    Recent Labs: 11/11/2021: ALT 13; BUN 16; Creatinine, Ser 0.94; Hemoglobin 10.9; Platelets 424.0; Potassium 4.8; Sodium 142; TSH 1.86  Recent Lipid Panel    Component Value Date/Time   CHOL 126 11/11/2021 1037   TRIG 297.0 (H) 11/11/2021 1037   HDL 38.50 (L) 11/11/2021 1037   CHOLHDL 3 11/11/2021 1037   VLDL 59.4 (H) 11/11/2021 1037   LDLCALC 48 12/08/2013 0824   LDLDIRECT 53.0 11/11/2021 1037     Risk  Assessment/Calculations:      Physical Exam:    VS:  BP 110/60   Pulse 83   Ht 5'  2" (1.575 m)   Wt 224 lb 9.6 oz (101.9 kg)   SpO2 94%   BMI 41.08 kg/m     Wt Readings from Last 3 Encounters:  01/20/22 224 lb 9.6 oz (101.9 kg)  11/11/21 223 lb (101.2 kg)  10/20/21 217 lb (98.4 kg)     GEN:  Well nourished, well developed in no acute distress HEENT: Normal NECK: No JVD; No carotid bruits CARDIAC: RRR, no murmurs, rubs, gallops RESPIRATORY:  Clear to auscultation, decreased breath sounds at bases ABDOMEN: Soft, non-tender, distended MUSCULOSKELETAL:  No edema; left-sided chest tenderness noted. SKIN: Warm and dry NEUROLOGIC:  Alert and oriented x 3 PSYCHIATRIC:  Normal affect   ASSESSMENT:    1. Coronary artery disease of native artery of native heart with stable angina pectoris ([place])   2. Hyperlipidemia LDL goal <70   3. Obesity, morbid, BMI 40.0-49.9 ([place])    PLAN:    In order of problems listed above:  CAD/CABG x1 on 07/2020.  Stable angina.  Chest pain improved with beta-blocker and Imdur.  Continue Toprol-XL 25 mg daily, Imdur 60 mg daily. EF 50%.  Continue aspirin, statin. hyperlipidemia.  LDL at goal, continue simvastatin. morbidly obese, patient is diabetic, start Ozempic.  Follow-up in 6 months.   Medication Adjustments/Labs and Tests Ordered: Current medicines are reviewed at length with the patient today.  Concerns regarding medicines are outlined above.  No orders of the defined types were placed in this encounter.    Meds ordered this encounter  Medications   Semaglutide,0.25 or 0.'5MG'$ /DOS, (OZEMPIC, 0.25 OR 0.5 MG/DOSE,) 2 MG/1.5ML SOPN    Sig: Inject 0.25 mg into the skin once a week. For 4 weeks.    Dispense:  1.5 mL    Refill:  0    Refill will be at a higher dose of 0.5 MG once a week for 4 weeks sent in as a separate prescription.   Semaglutide,0.25 or 0.'5MG'$ /DOS, (OZEMPIC, 0.25 OR 0.5 MG/DOSE,) 2 MG/1.5ML SOPN    Sig: Inject 0.5 mg into the skin once a week. For 4 weeks.    Dispense:  1.5 mL    Refill:  0    Refill will be  at a higher dose of 1.0 MG once a week for 4 weeks sent in as a separate prescription.   Semaglutide, 1 MG/DOSE, (OZEMPIC, 1 MG/DOSE,) 2 MG/1.5ML SOPN    Sig: Inject 1 mg into the skin once a week. For 4 weeks.    Dispense:  3 mL    Refill:  0    Refill will be at a higher dose of 2.0 MG once a week sent in as a separate prescription.   Semaglutide, 2 MG/DOSE, (OZEMPIC, 2 MG/DOSE,) 8 MG/3ML SOPN    Sig: Inject 2 mg into the skin once a week.    Dispense:  3 mL    Refill:  5      Patient Instructions  Medication Instructions:   Your physician has recommended you make the following change in your medication:   START taking Ozempic:  Inject 0.25 MG into skin once a week, for 4 weeks.   Inject 0.50 MG into skin once a week, for 4 weeks.   Inject 1.0 MG into skin once a week, for 4 weeks.   Inject 2.0 MG into skin once a week, and continue at this  dose.   *If you need a refill on your cardiac medications before your next appointment, please call your pharmacy*   Lab Work:  None ordered   Testing/Procedures:  None ordered   Follow-Up: At [facility], you and your health needs are our priority.  As part of our continuing mission to provide you with exceptional heart care, we have created designated Provider Care Teams.  These Care Teams include your primary Cardiologist (physician) and Advanced Practice Providers (APPs -  Physician Assistants and Nurse Practitioners) who all work together to provide you with the care you need, when you need it.  We recommend signing up for the patient portal called "MyChart".  Sign up information is provided on this After Visit Summary.  MyChart is used to connect with patients for Virtual Visits (Telemedicine).  Patients are able to view lab/test results, encounter notes, upcoming appointments, etc.  Non-urgent messages can be sent to your provider as well.   To learn more about what you can do with MyChart, go to  [email or web address].    Your next appointment:   6 month(s)  The format for your next appointment:   In Person  Provider:   Kate Sable, MD    Other Instructions   Important Information About Sugar         Signed, Kate Sable, MD  01/20/2022 12:20 PM    [place]

## 2022-02-04 DIAGNOSIS — J449 Chronic obstructive pulmonary disease, unspecified: Secondary | ICD-10-CM | POA: Diagnosis not present

## 2022-02-13 ENCOUNTER — Encounter: Payer: Self-pay | Admitting: Family Medicine

## 2022-02-13 ENCOUNTER — Telehealth: Payer: Self-pay | Admitting: Family Medicine

## 2022-02-13 ENCOUNTER — Ambulatory Visit (INDEPENDENT_AMBULATORY_CARE_PROVIDER_SITE_OTHER): Payer: Medicare HMO | Admitting: Family Medicine

## 2022-02-13 VITALS — BP 116/64 | HR 56 | Temp 97.8°F | Ht 62.0 in | Wt 228.0 lb

## 2022-02-13 DIAGNOSIS — R55 Syncope and collapse: Secondary | ICD-10-CM

## 2022-02-13 DIAGNOSIS — E118 Type 2 diabetes mellitus with unspecified complications: Secondary | ICD-10-CM | POA: Diagnosis not present

## 2022-02-13 DIAGNOSIS — I10 Essential (primary) hypertension: Secondary | ICD-10-CM

## 2022-02-13 DIAGNOSIS — J449 Chronic obstructive pulmonary disease, unspecified: Secondary | ICD-10-CM

## 2022-02-13 DIAGNOSIS — B370 Candidal stomatitis: Secondary | ICD-10-CM

## 2022-02-13 DIAGNOSIS — R42 Dizziness and giddiness: Secondary | ICD-10-CM

## 2022-02-13 DIAGNOSIS — L988 Other specified disorders of the skin and subcutaneous tissue: Secondary | ICD-10-CM | POA: Diagnosis not present

## 2022-02-13 DIAGNOSIS — Z794 Long term (current) use of insulin: Secondary | ICD-10-CM | POA: Diagnosis not present

## 2022-02-13 LAB — POCT GLYCOSYLATED HEMOGLOBIN (HGB A1C): Hemoglobin A1C: 6.2 % — AB (ref 4.0–5.6)

## 2022-02-13 MED ORDER — NYSTATIN 100000 UNIT/ML MT SUSP: 5.0000 mL | Freq: Three times a day (TID) | OROMUCOSAL | 0 refills | Status: DC

## 2022-02-13 MED ORDER — MUPIROCIN 2 % EX OINT: 1.0000 | TOPICAL_OINTMENT | Freq: Two times a day (BID) | CUTANEOUS | 0 refills | Status: DC

## 2022-02-13 NOTE — Assessment & Plan Note (Signed)
Appreciate pulm care - now on Breztri, receiving PAP through their office.

## 2022-02-13 NOTE — Progress Notes (Signed)
Patient ID: [name], female    DOB: 03-26-1951, 71 y.o.   MRN: [id number]  This visit was conducted in person.  BP 116/64 (BP Location: Left Arm, Cuff Size: Large)   Pulse (!) 56   Temp 97.8 F (36.6 C) (Temporal)   Ht 5\' 2"  (1.575 m)   Wt 228 lb (103.4 kg)   SpO2 95%   BMI 41.70 kg/m   Orthostatic VS for the past 24 hrs (Last 3 readings):  BP- Lying BP- Standing at 3 minutes  02/13/22 1024 -- 114/54  02/13/22 1021 114/60 --   CC: 18mo f/u visit Subjective:   HPI: [name] is a 71 y.o. female presenting on 02/13/2022 for Follow-up (Here for 3 mo f/u. Requests new rx for oral nystatin. Also, pt c/o dizziness- worse than normal. )   Notes worsening dizziness - presyncope without syncope. No vertigo. Feels she's staying well hydrated.   Gets 10-3998 steps/day (with walker).  Continues breztri daily - requests oral nystatin. Discussed rinsing mouth after use as she forgets to do this.   DM - does regularly check sugars and brings log: fasting 100-150, postprandial (dinner) 100-200s. Compliant with antihyperglycemic regimen which includes: metformin 1000mg  bid. Unable to afford ozempic. Denies low sugars or hypoglycemic symptoms. Denies paresthesias, blurry vision. Last diabetic eye exam 06/2020 - DUE. Glucometer brand: one touch ultra. Last foot exam: 03/2021. DSME: 2012.  Lab Results  Component Value Date   HGBA1C 6.2 (A) 02/13/2022   Diabetic Foot Exam - Simple   Simple Foot Form Diabetic Foot exam was performed with the following findings: Yes 02/13/2022 11:06 AM  Visual Inspection No deformities, no ulcerations, no other skin breakdown bilaterally: Yes Sensation Testing Intact to touch and monofilament testing bilaterally: Yes Pulse Check Posterior Tibialis and Dorsalis pulse intact bilaterally: Yes Comments    Lab Results  Component Value Date   MICROALBUR 1.6 11/11/2021         Relevant past medical, surgical, family and social history reviewed  and updated as indicated. Interim medical history since our last visit reviewed. Allergies and medications reviewed and updated. Outpatient Medications Prior to Visit  Medication Sig Dispense Refill   acetaminophen (TYLENOL) 650 MG CR tablet Take 1,300 mg by mouth 2 (two) times daily.     albuterol (VENTOLIN HFA) 108 (90 Base) MCG/ACT inhaler Inhale 2 puffs into the lungs every 6 (six) hours as needed for wheezing or shortness of breath. 18 g 3   Apple Cider Vinegar 600 MG CAPS      aspirin 81 MG EC tablet Take 1 tablet (81 mg total) by mouth daily.     aspirin-acetaminophen-caffeine (EXCEDRIN MIGRAINE) 250-250-65 MG tablet Take by mouth every 6 (six) hours as needed for headache.     Boswellia-Glucosamine-Vit D (OSTEO BI-FLEX ONE PER DAY PO) Take 1 tablet by mouth daily.     Budeson-Glycopyrrol-Formoterol (BREZTRI AEROSPHERE) 160-9-4.8 MCG/ACT AERO Inhale 2 puffs into the lungs in the morning and at bedtime. 10.7 g 11   cholecalciferol (VITAMIN D3) 25 MCG (1000 UNIT) tablet Take 1,000 Units by mouth daily.     cyclobenzaprine (FLEXERIL) 10 MG tablet TAKE 1/2 TO 1 TABLET BY MOUTH TWICE A DAY AS NEEDED FOR MUSCLE SPASMS AND MIGRAINE 30 tablet 3   famotidine (PEPCID) 20 MG tablet Take 20 mg by mouth every other day. Alternates with omeprazole     gabapentin (NEURONTIN) 100 MG capsule Take 1 capsule (100 mg total) by mouth 2 (two) times daily.  isosorbide mononitrate (IMDUR) 60 MG 24 hr tablet Take 1 tablet (60 mg total) by mouth daily. 90 tablet 1   levothyroxine (SYNTHROID) 100 MCG tablet TAKE 1 TABLET DAILY, AND ON2 DAYS A WEEK, TAKE AN     EXTRA 1/2 TABLET 90 tablet 2   loratadine (CLARITIN) 10 MG tablet Take 1 tablet (10 mg total) by mouth daily.     Melatonin 5 MG CAPS Take by mouth.     metFORMIN (GLUCOPHAGE) 500 MG tablet TAKE 2 TABLETS TWO TIMES A DAY WITH MEALS 360 tablet 0   metoprolol succinate (TOPROL-XL) 25 MG 24 hr tablet TAKE 1 TABLET (25 MG TOTAL) BY MOUTH DAILY. 90 tablet 1    Multiple Vitamin (MULTIVITAMIN WITH MINERALS) TABS tablet Take 1 tablet by mouth daily. One A Day for Women     naproxen sodium (ALEVE) 220 MG tablet Take 220 mg by mouth 2 (two) times daily with a meal.     nystatin cream (MYCOSTATIN) APPLY TO AFFECTED AREA TWICE A DAY 30 g 1   omeprazole (PRILOSEC) 20 MG capsule Take 1 capsule (20 mg total) by mouth daily. Take 1 daily-Alternates with famotidine 90 capsule 3   ONE TOUCH LANCETS MISC Use to check sugar twice daily Dx: E11.8 200 each 3   ONETOUCH ULTRA test strip USE TO CHECK BLOOD SUGAR 2 TIMES   DAILY 200 strip 3   OXYGEN Inhale 2 L into the lungs at bedtime.     Rhubarb (ESTROVEN COMPLETE PO) Take 1 tablet by mouth daily.     simvastatin (ZOCOR) 40 MG tablet TAKE 1 TABLET AT BEDTIME 90 tablet 0   torsemide (DEMADEX) 20 MG tablet Take 1 tablet (20 mg total) by mouth daily. 90 tablet 3   traMADol (ULTRAM) 50 MG tablet TAKE 1 TABLET (50 MG TOTAL) BY MOUTH TWICE A DAY AS NEEDED FOR MODERATE PAIN 30 tablet 0   vitamin B-12 (CYANOCOBALAMIN) 1000 MCG tablet Take 1,000 mcg by mouth daily.     ziprasidone (GEODON) 40 MG capsule TAKE 1 CAPSULE EVERY       MORNING AND AT BEDTIME 180 capsule 1   gabapentin (NEURONTIN) 100 MG capsule TAKE 1 TO 3 CAPSULES AT    BEDTIME 200 capsule 1   mupirocin ointment (BACTROBAN) 2 % Apply 1 application. topically 2 (two) times daily. 15 g 0   Semaglutide, 1 MG/DOSE, (OZEMPIC, 1 MG/DOSE,) 2 MG/1.5ML SOPN Inject 1 mg into the skin once a week. For 4 weeks. 3 mL 0   Semaglutide, 2 MG/DOSE, (OZEMPIC, 2 MG/DOSE,) 8 MG/3ML SOPN Inject 2 mg into the skin once a week. 3 mL 5   Semaglutide,0.25 or 0.5MG /DOS, (OZEMPIC, 0.25 OR 0.5 MG/DOSE,) 2 MG/1.5ML SOPN Inject 0.25 mg into the skin once a week. For 4 weeks. 1.5 mL 0   Semaglutide,0.25 or 0.5MG /DOS, (OZEMPIC, 0.25 OR 0.5 MG/DOSE,) 2 MG/1.5ML SOPN Inject 0.5 mg into the skin once a week. For 4 weeks. 1.5 mL 0   No facility-administered medications prior to visit.     Per HPI  unless specifically indicated in ROS section below Review of Systems  Objective:  BP 116/64 (BP Location: Left Arm, Cuff Size: Large)   Pulse (!) 56   Temp 97.8 F (36.6 C) (Temporal)   Ht 5\' 2"  (1.575 m)   Wt 228 lb (103.4 kg)   SpO2 95%   BMI 41.70 kg/m   Wt Readings from Last 3 Encounters:  02/13/22 228 lb (103.4 kg)  01/20/22 224 lb 9.6  oz (101.9 kg)  11/11/21 223 lb (101.2 kg)      Physical Exam Vitals and nursing note reviewed.  Constitutional:      Appearance: Normal appearance. She is obese. She is not ill-appearing.     Comments: Ambulates with walker  Eyes:     Extraocular Movements: Extraocular movements intact.     Conjunctiva/sclera: Conjunctivae normal.     Pupils: Pupils are equal, round, and reactive to light.  Cardiovascular:     Rate and Rhythm: Normal rate and regular rhythm.     Pulses: Normal pulses.     Heart sounds: Normal heart sounds. No murmur heard. Pulmonary:     Effort: Pulmonary effort is normal. No respiratory distress.     Breath sounds: Normal breath sounds. No wheezing, rhonchi or rales.  Musculoskeletal:     Right lower leg: No edema.     Left lower leg: No edema.     Comments:  R arm in sling Painful arc on right  Skin:    General: Skin is warm and dry.     Findings: No rash.  Neurological:     Mental Status: She is alert.  Psychiatric:        Mood and Affect: Mood normal.        Behavior: Behavior normal.       Results for orders placed or performed in visit on 02/13/22  POCT glycosylated hemoglobin (Hb A1C)  Result Value Ref Range   Hemoglobin A1C 6.2 (A) 4.0 - 5.6 %   HbA1c POC (<> result, manual entry)     HbA1c, POC (prediabetic range)     HbA1c, POC (controlled diabetic range)      Assessment & Plan:   Problem List Items Addressed This Visit     Diabetes mellitus type 2, controlled, with complications (HCC) - Primary    Chronic, stable period on metformin.  Cardiology rec starting [facility] however pt unable to  afford. I will ask pharmacy to price out GLP1RA or mounjaro.       Relevant Orders   POCT glycosylated hemoglobin (Hb A1C) (Completed)   AMB Referral to [facility] Coordinaton   Hypertension    Chronci, stable period on imdur and toprol XL and torsemide however notes symptoms consistent with presyncope - will ask her to drop imdur to 1/2 tab daily and monitor effect on presyncope.       COPD (chronic obstructive pulmonary disease) (HCC)    Appreciate pulm care - now on Breztri, receiving PAP through their office.       Oral thrush    Oral nystatin refilled. Emphasized importance of rinsing mouth after Breztri use.       Relevant Medications   mupirocin ointment (BACTROBAN) 2 %   nystatin (MYCOSTATIN) 100000 UNIT/ML suspension   Skin lesion of breast    Recurrent sores to L medial breast - mupirocin refilled which has helped previously. She has derm appt pending (05/2022).       Postural dizziness with presyncope    Anticipate related to low BP readings - see below.         Meds ordered this encounter  Medications   mupirocin ointment (BACTROBAN) 2 %    Sig: Apply 1 Application topically 2 (two) times daily.    Dispense:  15 g    Refill:  0   nystatin (MYCOSTATIN) 100000 UNIT/ML suspension    Sig: Take 5 mLs (500,000 Units total) by mouth 3 (three) times daily.  Dispense:  120 mL    Refill:  0   Orders Placed This Encounter  Procedures   AMB Referral to [facility] Coordinaton    Referral Priority:   Routine    Referral Type:   Consultation    Referral Reason:   Care Coordination    Number of Visits Requested:   1   POCT glycosylated hemoglobin (Hb A1C)     Patient Instructions  Schedule diabetic eye exam.  Try cutting isosorbide in half to see if improvement in dizziness. If worsening chest pain, return to full dose.  I will ask our pharmacist to price out ozempic/trulicity/mounjaro.  Call ortho for R shoulder pain.  I've refilled nystatin oral  solution and mupirocin antibiotic cream.  Return in 3 months for follow up visit.   Follow up plan: Return in about 3 months (around 05/16/2022) for follow up visit.  Eustaquio Boyden, MD

## 2022-02-13 NOTE — Telephone Encounter (Signed)
Can we see if pt eligible for assistance with ozempic, trulicity, or mounjaro? States unaffordable otherwise. [id number] referral placed. Thanks.  She does receive PAP for [facility] through pulmonology office.

## 2022-02-14 DIAGNOSIS — E119 Type 2 diabetes mellitus without complications: Secondary | ICD-10-CM | POA: Diagnosis not present

## 2022-02-14 LAB — HM DIABETES EYE EXAM

## 2022-02-15 ENCOUNTER — Telehealth: Payer: Self-pay

## 2022-02-15 ENCOUNTER — Ambulatory Visit (INDEPENDENT_AMBULATORY_CARE_PROVIDER_SITE_OTHER): Payer: Medicare HMO | Admitting: Pharmacist

## 2022-02-15 DIAGNOSIS — Z794 Long term (current) use of insulin: Secondary | ICD-10-CM

## 2022-02-15 DIAGNOSIS — I25118 Atherosclerotic heart disease of native coronary artery with other forms of angina pectoris: Secondary | ICD-10-CM

## 2022-02-15 DIAGNOSIS — I10 Essential (primary) hypertension: Secondary | ICD-10-CM

## 2022-02-15 DIAGNOSIS — E1169 Type 2 diabetes mellitus with other specified complication: Secondary | ICD-10-CM

## 2022-02-15 DIAGNOSIS — J449 Chronic obstructive pulmonary disease, unspecified: Secondary | ICD-10-CM

## 2022-02-15 NOTE — Progress Notes (Signed)
    Chronic Care Management Pharmacy Assistant   Name: [name]  MRN: [id number] DOB: [birth year]  Reason for Encounter: CCM (Ozempic PAP 2023)   Patient assistance application for Ozempic [employer]) has been completed and uploaded.  Charlene Brooke, CPP notified  Marijean Niemann, [place] Clinical Pharmacy Assistant [phone]

## 2022-02-15 NOTE — Progress Notes (Signed)
Chronic Care Management Pharmacy Note  02/15/2022 Name:  [name] MRN:  [id number] DOB:  04-Oct-1950  Summary: CCM F/U visit -DM: A1c 6.2%; on metformin only. She could not afford Ozempic but would benefit for weight loss, CV protection. She should qualify for Novo Cares PAP  Recommendations/Changes made from today's visit: -Pursue PAP for Ozempic; pt will bring income documents to office and sign paperwork  Plan: -[place] will follow up weekly on PAP status -Pharmacist follow up televisit scheduled for 4 months -PCP 05/16/22    Subjective: [name] is an 71 y.o. year old female who is a primary patient of Ria Bush, MD.  The CCM team was consulted for assistance with disease management and care coordination needs.    Engaged with patient by telephone for follow up visit in response to provider referral for pharmacy case management and/or care coordination services.   Consent to Services:  The patient was given information about Chronic Care Management services, agreed to services, and gave verbal consent prior to initiation of services.  Please see initial visit note for detailed documentation.   Patient Care Team: Ria Bush, MD as PCP - General (Family Medicine) Kate Sable, MD as PCP - Cardiology (Cardiology) Bryson Ha, OD as Consulting Physician (Optometry) Charlton Haws, [facility] as Pharmacist (Pharmacist)  Patient is from [place], she has been in [facility] for over 20 years. Her daughter still lives in [place], 2 brothers in [place], 1 brother in [place], sister in [place]. Pt is on disability x 10 years. Her husband is retired in his 23s. She is "not very mobile", gets out for doctors visits, grocery store. She uses walker to get around.   Recent office visits: 02/13/22 Dr Danise Mina OV: A1c 6.2%; did not pick up Ozempic due to cost. Try for PAP  11/11/21 Dr Danise Mina OV: AWV - order heart monitor. Try Toprol XL 25 mg daily. Take Nystatin for  thrush. Schedule DM eye exam. Refer to dermatology for breast wound. F/U 3 months.  03/30/21 Danise Mina (PCP) - Controlled type 2 diabetes mellitus with complication. Start Gabapentin for paresthesias D/c Topiramate.   01/10/21 Danise Mina (PCP) - Intractable migraine with status migrainosus. No med changes. Given Dexamethasone/ketorolac/promethazine injection in clinic. Ordered Nurtec.   12/27/20 Danise Mina (PCP) - Controlled type 2 diabetes mellitus with complication. Stop Novolin N due to low A1c, hypoglycemia.  Recent consult visits: 01/20/22 Dr Garen Lah (Cardiology): f/u CAD. Start Ozempic (DM, obesity, CAD).  10/20/21 Dr Garen Lah (Cardiology): f/u CAD - stable angina. Increase Imdur to 60 mg.  06/23/21 Dr Patsey Berthold (pulmonary): f/u COPD; resume Breztri. SOB mostly related to obesity, deconditioning, diastolic dysfunction. No need for CPAP.  04/21/21 Agbor-Etang (Cardiology) - F/U CAD. Chest discomfort improved with higher dose of Imdur. No med changes.   03/07/21 [place] (Neurology) - Chronic migraine without aura, with intractable migraine. Start Ajovy 225 mg.   7/03/28/21 Agbor-Etang (Cardiology) - F/U CAD. Start Torsemide. Increase Isosorbide to 30 mg.  02/24/21 Patsey Berthold (Pulmonary) - Stage 1 mild COPD by GOLD classification. Start Trelegy. D/c Novolin & Anoro Ellipta. (Changed to [facility] 7/25 due to cost)  Hospital visits: None in previous 6 months   Objective:  Lab Results  Component Value Date   CREATININE 0.94 11/11/2021   BUN 16 11/11/2021   GFR 62.06 11/11/2021   GFRNONAA >60 03/04/2021   GFRAA 80 08/26/2020   NA 142 11/11/2021   K 4.8 11/11/2021   CALCIUM 9.4 11/11/2021   CO2 27 11/11/2021  GLUCOSE 139 (H) 11/11/2021    Lab Results  Component Value Date/Time   HGBA1C 6.2 (A) 02/13/2022 10:35 AM   HGBA1C 6.6 (H) 11/11/2021 10:37 AM   HGBA1C 6.0 (A) 03/30/2021 09:58 AM   HGBA1C 5.9 (H) 07/30/2020 12:00 PM   GFR 62.06 11/11/2021 10:37 AM   GFR 86.14 09/27/2020 11:17 AM    MICROALBUR 1.6 11/11/2021 10:37 AM   MICROALBUR 2.4 (H) 12/27/2020 11:54 AM    Last diabetic Eye exam:  Lab Results  Component Value Date/Time   HMDIABEYEEXA No Retinopathy 07/08/2020 12:00 AM    Last diabetic Foot exam:  Lab Results  Component Value Date/Time   HMDIABFOOTEX done 02/25/2013 12:00 AM     Lab Results  Component Value Date   CHOL 126 11/11/2021   HDL 38.50 (L) 11/11/2021   LDLCALC 48 12/08/2013   LDLDIRECT 53.0 11/11/2021   TRIG 297.0 (H) 11/11/2021   CHOLHDL 3 11/11/2021       Latest Ref Rng & Units 11/11/2021   10:37 AM 09/14/2020    1:15 PM 08/02/2020   11:30 AM  Hepatic Function  Total Protein 6.0 - 8.3 g/dL 7.1  7.3  7.3   Albumin 3.5 - 5.2 g/dL 4.5  4.1  3.8   AST 0 - 37 U/L '14  13  20   ' ALT 0 - 35 U/L '13  9  20   ' Alk Phosphatase 39 - 117 U/L 51  65  40   Total Bilirubin 0.2 - 1.2 mg/dL 0.2  0.3  0.6     Lab Results  Component Value Date/Time   TSH 1.86 11/11/2021 10:37 AM   TSH 1.37 09/14/2020 01:15 PM   FREET4 0.94 04/10/2017 09:53 AM   FREET4 0.79 03/30/2016 08:52 AM       Latest Ref Rng & Units 11/11/2021   10:37 AM 02/25/2021   10:36 AM 10/27/2020    9:55 AM  CBC  WBC 4.0 - 10.5 K/uL 12.1  11.7  17.9   Hemoglobin 12.0 - 15.0 g/dL 10.9  10.7  10.8   Hematocrit 36.0 - 46.0 % 34.2  33.1  33.5   Platelets 150.0 - 400.0 K/uL 424.0  427  400     No results found for: "VD25OH"  Clinical ASCVD: Yes  The ASCVD Risk score (Arnett DK, et al., 2019) failed to calculate for the following reasons:   The valid total cholesterol range is 130 to 320 mg/dL       11/11/2021   10:42 AM 11/23/2020    1:16 PM 10/11/2020    4:17 PM  Depression screen PHQ 2/9  Decreased Interest 0 0 1  Down, Depressed, Hopeless 0 0 0  PHQ - 2 Score 0 0 1  Altered sleeping 0 0 1  Tired, decreased energy 1 0 1  Change in appetite 0 0 2  Feeling bad or failure about yourself  0 0 0  Trouble concentrating 0 0 0  Moving slowly or fidgety/restless 0 0 0  Suicidal  thoughts 0 0 0  PHQ-9 Score 1 0 5  Difficult doing work/chores  Not difficult at all Not difficult at all       11/11/2021   10:42 AM 05/26/2020   11:22 AM  GAD 7 : Generalized Anxiety Score  Nervous, Anxious, on Edge 0 0  Control/stop worrying 0 0  Worry too much - different things 0 0  Trouble relaxing 0 0  Restless 0 0  Easily annoyed or irritable 0 0  Afraid - awful might happen 0 0  Total GAD 7 Score 0 0     Social History   Tobacco Use  Smoking Status Former   Packs/day: 2.00   Years: 43.00   Total pack years: 86.00   Types: Cigarettes   Quit date: 05/17/2010   Years since quitting: 11.7  Smokeless Tobacco Never  Tobacco Comments   QUIT 2011   BP Readings from Last 3 Encounters:  02/13/22 116/64  01/20/22 110/60  11/11/21 124/62   Pulse Readings from Last 3 Encounters:  02/13/22 (!) 56  01/20/22 83  11/11/21 95   Wt Readings from Last 3 Encounters:  02/13/22 228 lb (103.4 kg)  01/20/22 224 lb 9.6 oz (101.9 kg)  11/11/21 223 lb (101.2 kg)   BMI Readings from Last 3 Encounters:  02/13/22 41.70 kg/m  01/20/22 41.08 kg/m  11/11/21 41.12 kg/m    Assessment/Interventions: Review of patient past medical history, allergies, medications, health status, including review of consultants reports, laboratory and other test data, was performed as part of comprehensive evaluation and provision of chronic care management services.   SDOH:  (Social Determinants of Health) assessments and interventions performed: Yes SDOH Interventions    Flowsheet Row Most Recent Value  SDOH Interventions   Financial Strain Interventions Other (Comment)  [PAP - Breztri, Ozempic]      SDOH Screenings   Alcohol Screen: Low Risk  (11/23/2020)   Alcohol Screen    Last Alcohol Screening Score (AUDIT): 0  Depression (PHQ2-9): Low Risk  (11/11/2021)   Depression (PHQ2-9)    PHQ-2 Score: 1  Financial Resource Strain: Medium Risk (02/15/2022)   Overall Financial Resource Strain  (CARDIA)    Difficulty of Paying Living Expenses: Somewhat hard  Food Insecurity: No Food Insecurity (11/23/2020)   Hunger Vital Sign    Worried About Running Out of Food in the Last Year: Never true    Ran Out of Food in the Last Year: Never true  Housing: Low Risk  (11/23/2020)   Housing    Last Housing Risk Score: 0  Physical Activity: Inactive (11/23/2020)   Exercise Vital Sign    Days of Exercise per Week: 0 days    Minutes of Exercise per Session: 0 min  Social Connections: Not on file  Stress: No Stress Concern Present (11/23/2020)   [place] of Stress : Not at all  Tobacco Use: Medium Risk (02/13/2022)   Patient History    Smoking Tobacco Use: Former    Smokeless Tobacco Use: Never    Passive Exposure: Not on file  Transportation Needs: No Transportation Needs (11/23/2020)   PRAPARE - Hydrologist (Medical): No    Lack of Transportation (Non-Medical): No    CCM Care Plan  Allergies  Allergen Reactions   Crestor [Rosuvastatin] Other (See Comments)    myalgias   Lipitor [Atorvastatin] Other (See Comments)    Mood swings   Lithium Other (See Comments)    abd pain, n/v, was hospitalized   Quetiapine Other (See Comments)    Muscle twitching, muscle spasm    Medications Reviewed Today     Reviewed by Charlton Haws, [facility] (Pharmacist) on 02/15/22 at [phone]  Med List Status: <None>   Medication Order Taking? Sig Documenting Provider Last Dose Status Informant  acetaminophen (TYLENOL) 650 MG CR tablet [id number] Yes Take 1,300 mg by mouth 2 (two) times daily. [provider] Taking Active  albuterol (VENTOLIN HFA) 108 (90 Base) MCG/ACT inhaler [id number] Yes Inhale 2 puffs into the lungs every 6 (six) hours as needed for wheezing or shortness of breath. Tyler Pita, MD Taking Active   Apple Cider Vinegar 600 MG CAPS [id number] Yes  [provider]  Taking Active   aspirin 81 MG EC tablet [id number] Yes Take 1 tablet (81 mg total) by mouth daily. Kate Sable, MD Taking Active   aspirin-acetaminophen-caffeine [facility] MIGRAINE) [phone] MG tablet [id number] Yes Take by mouth every 6 (six) hours as needed for headache. [provider] Taking Active   Boswellia-Glucosamine-Vit D (OSTEO BI-FLEX ONE PER DAY PO) [id number] Yes Take 1 tablet by mouth daily. [provider] Taking Active   Budeson-Glycopyrrol-Formoterol (BREZTRI AEROSPHERE) 160-9-4.8 MCG/ACT AERO [id number] Yes Inhale 2 puffs into the lungs in the morning and at bedtime. Tyler Pita, MD Taking Active            Med Note Tyrone Sage Oct 20, 2021 11:22 AM)    cholecalciferol (VITAMIN D3) 25 MCG (1000 UNIT) tablet [id number] Yes Take 1,000 Units by mouth daily. [provider] Taking Active   cyclobenzaprine (FLEXERIL) 10 MG tablet [id number] Yes TAKE 1/2 TO 1 TABLET BY MOUTH TWICE A DAY AS NEEDED FOR MUSCLE SPASMS AND MIGRAINE Ria Bush, MD Taking Active   famotidine (PEPCID) 20 MG tablet [id number] Yes Take 20 mg by mouth every other day. Alternates with omeprazole [provider] Taking Active Self  gabapentin (NEURONTIN) 100 MG capsule [id number] Yes Take 1 capsule (100 mg total) by mouth 2 (two) times daily. Ria Bush, MD Taking Active   isosorbide mononitrate (IMDUR) 60 MG 24 hr tablet [id number]  Take 1 tablet (60 mg total) by mouth daily. Kate Sable, MD  Expired 12/19/21 2359   levothyroxine (SYNTHROID) 100 MCG tablet [id number] Yes TAKE 1 TABLET DAILY, AND ON2 DAYS A WEEK, TAKE AN     EXTRA 1/2 TABLET Ria Bush, MD Taking Active   loratadine (CLARITIN) 10 MG tablet [id number] Yes Take 1 tablet (10 mg total) by mouth daily. Ria Bush, MD Taking Active Self  Melatonin 5 MG CAPS [id number] Yes Take by mouth. [provider] Taking Active   metFORMIN (GLUCOPHAGE) 500 MG tablet  [id number] Yes TAKE 2 TABLETS TWO TIMES A DAY WITH MEALS Ria Bush, MD Taking Active   metoprolol succinate (TOPROL-XL) 25 MG 24 hr tablet [id number] Yes TAKE 1 TABLET (25 MG TOTAL) BY MOUTH DAILY. Ria Bush, MD Taking Active   Multiple Vitamin (MULTIVITAMIN WITH MINERALS) TABS tablet [id number] Yes Take 1 tablet by mouth daily. One A Day for Women [provider] Taking Active Self  mupirocin ointment (BACTROBAN) 2 % [id number] Yes Apply 1 Application topically 2 (two) times daily. Ria Bush, MD Taking Active   naproxen sodium (ALEVE) 220 MG tablet [id number] Yes Take 220 mg by mouth 2 (two) times daily with a meal. [provider] Taking Active   nystatin (MYCOSTATIN) 100000 UNIT/ML suspension [id number] Yes Take 5 mLs (500,000 Units total) by mouth 3 (three) times daily. Ria Bush, MD Taking Active   nystatin cream Royden Purl) [id number] Yes APPLY TO [place] Gutierrez, Javier, MD Taking Active   omeprazole (PRILOSEC) 20 MG capsule [id number] Yes Take 1 capsule (20 mg total) by mouth daily. Take 1 daily-Alternates with famotidine Ria Bush, MD Taking Active   ONE [facility] LANCETS [place] [id number] Yes Use to check sugar twice daily Dx: E11.8 Ria Bush, MD Taking Active  Self  ONETOUCH ULTRA test strip [id number] Yes USE TO CHECK BLOOD SUGAR 2 TIMES   DAILY Ria Bush, MD Taking Active   OXYGEN [id number] Yes Inhale 2 L into the lungs at bedtime. [provider] Taking Active Self  Rhubarb (ESTROVEN COMPLETE PO) [id number] Yes Take 1 tablet by mouth daily. [provider] Taking Active Self  Semaglutide,0.25 or 0.5MG/DOS, (OZEMPIC, 0.25 OR 0.5 MG/DOSE,) 2 MG/3ML SOPN [id number]  Inject into the skin. Inject 0.25 mg weekly for 4 weeks, then increase to 0.5 mg weekly [provider]  Active   simvastatin (ZOCOR) 40 MG tablet [id number] Yes TAKE 1 TABLET AT BEDTIME Ria Bush, MD Taking Active    torsemide (DEMADEX) 20 MG tablet [id number] Yes Take 1 tablet (20 mg total) by mouth daily. Kate Sable, MD Taking Active   traMADol (ULTRAM) 50 MG tablet [id number] Yes TAKE 1 TABLET (50 MG TOTAL) BY MOUTH TWICE A DAY AS NEEDED FOR MODERATE PAIN Ria Bush, MD Taking Active   vitamin B-12 (CYANOCOBALAMIN) 1000 MCG tablet [id number] Yes Take 1,000 mcg by mouth daily. [provider] Taking Active   ziprasidone (GEODON) 40 MG capsule [id number] Yes TAKE 1 CAPSULE EVERY       MORNING AND AT BEDTIME Ria Bush, MD Taking Active             Patient Active Problem List   Diagnosis Date Noted   Postural dizziness with presyncope 02/13/2022   Oral thrush 11/13/2021   Lymphadenopathy of right cervical region 11/13/2021   Skin lesion of breast 11/13/2021   Unilateral occipital headache 01/10/2021   Abdominal aortic ectasia ([place]) 10/09/2020   Back pain 09/27/2020   Gross hematuria 09/14/2020   S/P CABG x 1 08/03/2020   Abnormal cardiovascular stress test    Aortic atherosclerosis ([place]) 04/15/2020   COPD (chronic obstructive pulmonary disease) ([place]) 04/15/2020   Pill dysphagia 04/05/2020   Cervical neck pain with evidence of disc disease 10/23/2019   Left hip pain 10/23/2019   Right knee pain 10/23/2019   Osteoarthritis 10/23/2019   Anemia 10/23/2019   Weight loss, unintentional 10/05/2019   Hepatic steatosis 05/03/2019   Thrombocytosis 04/24/2019   Abdominal pain 04/24/2019   Migraine 04/19/2018   Obesity, morbid, BMI 40.0-49.9 ([place]) 08/17/2017   Palpitations 01/08/2017   Mass of right side of neck 07/10/2016   Neutrophilia 04/30/2015   Advanced care planning/counseling discussion 01/22/2015   Health maintenance examination 01/22/2015   Medicare annual wellness visit, subsequent 12/15/2013   Tachycardia 09/13/2012   Osteoarthritis of left knee 12/11/2011   Candidal intertrigo 05/10/2011   Hypertension    Bipolar disorder ([place])    Fibromyalgia     Hyperlipidemia associated with type 2 diabetes mellitus ([place])    GERD (gastroesophageal reflux disease)    Hypothyroidism 04/05/2011   OSA (obstructive sleep apnea) 03/16/2011   CAD (coronary artery disease) 03/16/2011   Diabetes mellitus type 2, controlled, with complications ([place]) 58/04/9832   Dyspnea 01/03/2011    Immunization History  Administered Date(s) Administered   Fluad Quad(high Dose 65+) 04/24/2019, 05/26/2020   Influenza Split 05/09/2011, 06/11/2012   Influenza Whole 05/20/2010   Influenza, High Dose Seasonal PF 06/01/2021   Influenza,inj,Quad PF,6+ Mos 07/14/2013, 06/16/2014, 04/30/2015, 05/31/2016, 05/10/2017, 04/19/2018   Moderna Covid-19 Vaccine Bivalent Booster 55yr & up 06/24/2021   Moderna SARS-COV2 Booster Vaccination 06/21/2020, 12/31/2020   Moderna Sars-Covid-2 Vaccination 10/18/2019, 11/15/2019   Pneumococcal Conjugate-13 11/14/2017, 04/19/2018   Pneumococcal Polysaccharide-23 04/18/2011, 06/01/2021   Td 04/18/2011   Zoster Recombinat (  Shingrix) 11/14/2017, 01/02/2018   Zoster, Live 12/29/2013    Conditions to be addressed/monitored:  Hypertension, Hyperlipidemia, Diabetes, Coronary Artery Disease, COPD, Osteoarthritis, and Bipolar disorder  Care Plan : [place]  Updates made by Charlton Haws, [place] since 02/15/2022 12:00 AM     Problem: Hypertension, Hyperlipidemia, Diabetes, Coronary Artery Disease, COPD, Osteoarthritis, and Bipolar disorder   Priority: High     Long-Range Goal: Disease management   Start Date: 05/11/2021  Expected End Date: 12/21/2022  This Visit's Progress: On track  Recent Progress: On track  Priority: High  Note:   Current Barriers:  Unable to independently afford treatment regimen  Pharmacist Clinical Goal(s):  Patient will verbalize ability to afford treatment regimen through collaboration with PharmD and provider.   Interventions: 1:1 collaboration with Ria Bush, MD regarding development and  update of comprehensive plan of care as evidenced by provider attestation and co-signature Inter-disciplinary care team collaboration (see longitudinal plan of care) Comprehensive medication review performed; medication list updated in electronic medical record  Hypertension (BP goal <130/80) -Controlled - BP is at goal home, on avg; pt reports marked improvement in chest pain since starting metoprolol -ECHO 05/2020 - LVEF 40% and Grade 1 diastolic dysfunction (prior to CABG) -Denies hypotensive/hypertensive symptoms -Current home readings: n/a -Current treatment: Isosorbide MN 60 mg -1/2 tab daily - Appropriate, Effective, Safe, Accessible Torsemide 20 mg daily - Appropriate, Effective, Safe, Accessible Metoprolol succinate 25 mg daily - Appropriate, Effective, Safe, Accessible -Educated on BP goals and benefits of medications for prevention of heart attack, stroke and kidney damage; Daily salt intake goal < 2300 mg; -Counseled that torsemide was added by cardiologist to help with SOB -Counseled to monitor BP at home daily -Recommended to continue current medication  Hyperlipidemia: (LDL goal < 70) -Controlled - LDL is at goal; pt endorses compliance with statin and denies issues -Hx CAD, CABG 2012, 07/2020; [place] 11/09/2020 with no significant ischemia, fixed apical defect. -Current treatment: Simvastatin 40 mg daily - Appropriate, Effective, Safe, Accessible Aspirin 81 mg daily -Appropriate, Effective, Safe, Accessible -Educated on Cholesterol goals; Benefits of statin for ASCVD risk reduction; -Recommended to continue current medication  Diabetes (A1c goal <7%) -Controlled - A1c 6.2% (382505) at goal;  pt endorses compliance with metformin; recently she was prescribed Ozempic for DM, wt loss, CV protection but could not afford it -Current home glucose readings fasting glucose: n/a post prandial glucose: n/a -Denies hypoglycemic/hyperglycemic symptoms -Current  medications: Metformin 500 mg -2 tab BID -Appropriate, Effective, Safe, Accessible Ozempic 0.25 mg weekly - not started yet (cost) Testing supplies -Medications previously tried: Novolin N, glimepiride, pioglitazone -Current exercise: step exercises - 4000 steps/day (limited d/t knees, SOB) -Assessed pt finances - she should qualify for Novo Cares PAP; she will bring proof of income to office and sign paperwork for Ozempic -Recommend to continue current medication  COPD (Goal: control symptoms and prevent exacerbations) -Controlled - Pt notes improvement since taking Breztri now that she gets it free through PAP; she did develop thrush recently and was treated with nystatin rinse, initially thrush improved but it does keep returning -Gold Grade: Gold 1 (FEV1>80%) -Current COPD Classification:  A (low sx, <2 exacerbations/yr) -MMRC/CAT score: not on file -Pulmonary function testing: 04/20/20 - FEV1 84% predicted, FEV1/FVC 0.76 -Exacerbations requiring treatment in last 6 months: 0 -Current treatment  Breztri 160-9-4.8 mcg/act 2 puffs BID (PAP) -Appropriate, Effective, Safe, Accessible Albuterol HFA prn - Appropriate, Effective, Safe, Accessible Oxygen 2L HS -Medications previously tried: Trelegy ($$),  Anoro, Symbciort  -Patient denies consistent use of maintenance inhaler -Frequency of rescue inhaler use: every other day -Counseled pt is enrolled in [place] through 08/20/2022, at end of 2023 we can re-apply for following year -Counseled Breztri contained ICS which can cause thrush; advised to rinse mouth with mouthwash following every use of Breztri -Recommend to continue current medication  Bipolar disorder (Goal: manage symptoms) -not addressed 02/15/22 -Controlled - Pt reports "ziprasidone is a life saver"; she never wants to change this med -Current treatment: Ziprasidone 40 mg BID -Appropriate, Effective, Safe, Accessible -Medications previously tried/failed: quetiapine,  diazepam -Connected with PCP for mental health support -Recommended to continue current medication  Migraines (Goal: prevent episodes) - not addressed 02/15/22 -Not ideally controlled - pt stopped Ajovy months ago because it was not helping and it was very expensive -Pt has appt 07/19/21 but had to cancel due to husband's health issues -Current treatment  Cyclobenzaprine 10 mg PRN -Appropriate, Query Effective Excedrin PRN (APAP/caffeine/ASA) - Appropriate, Query Effective -Medications previously tried: Nurtec, topamax, Ajovy -Discussed that there are other medications available for migraine prevention that can be tried; pt to discuss her concerns with her neurologist  Pain (Goal: manage symptoms) -not addressed 02/15/22 -Not ideally controlled - pt reports gel injections in her knee were not helpful; she is meant to follow up with ortho for next steps but will have a hard time getting to any appts in immediate future due to husband's poor health -Pt requests refill for tramadol - she uses very seldom but back pain has been worse lately due to caring for her husband and sleeping on couch while he is in living room in hospital bed;  -osteoarthritis of knee, cervical pain, fibromyalgia -Current treatment  Cyclobenzaprine 10 mg PRN -Appropriate, Query Effective Gabapentin 100 mg 1-3 cap HS -Appropriate, Query Effective Naproxen - 1 tab BID - Appropriate, Effective,  Query Safe Tylenol 650 mg - 2 tab BID -Appropriate, Query Effective Tramadol 50 mg PRN (last rx 08/11/21) -Appropriate, Query Effective -Counseled on benefits/risks of increasing gabapentin, advised it is safe to try higher dose -Counseled on risks of chronic NSAID use -  advised her to use the lowest effective dose of NSAID to control her pain in order to reduce CV and kidney risks  Health Maintenance -Vaccine gaps: TDAP  Patient Goals/Self-Care Activities Patient will:  - take medications as prescribed focus on medication  adherence by pill box -check glucose twice a day, document, and provide at future appointments -check blood pressure daily, document, and provide at future appointments     Medication Assistance:  Breztri - [place]&Me approved through 08/20/22  Compliance/Adherence/Medication fill history: Care Gaps: Eye exam (due 07/08/21)  Star-Rating Drugs: Simvastatin - PDC 86%; LF 12/21/21, 10/17/21 x 90 ds Metformin - PDC 100%  Medication Access: Within the past 30 days, how often has patient missed a dose of medication? 0 Is a pillbox or other method used to improve adherence? Yes  Factors that may affect medication adherence? financial need Are meds synced by current pharmacy? No  Are meds delivered by current pharmacy? Yes  Does patient experience delays in picking up medications due to transportation concerns? No   [facility] Services Reviewed: Is patient disadvantaged to use [facility]?: Yes  Current Rx insurance plan: [employer] [place] Name and location of Current pharmacy:  [id number] [place]Phone: [phone]: [phone] [facility] [address] Portal to Registered [place] [address] Phone: [phone] Fax: [phone]  [facility] services reviewed with patient today?: No  Reason patient declined to change pharmacies: Disadvantaged due to insurance/mail order   Patient decided to: Continue current medication management strategy  Care Plan and Follow Up Patient Decision:  Patient agrees to Care Plan and Follow-up.  Plan: Telephone follow up appointment with care management team member scheduled for:  4 months  Charlene Brooke, PharmD, [facility] Clinical Pharmacist [facility] [phone]

## 2022-02-15 NOTE — Patient Instructions (Signed)
Visit Information  Phone number for Pharmacist: [phone]   Goals Addressed   None     Care Plan : [place]  Updates made by Charlton Haws, RPH since 02/15/2022 12:00 AM     Problem: Hypertension, Hyperlipidemia, Diabetes, Coronary Artery Disease, COPD, Osteoarthritis, and Bipolar disorder   Priority: High     Long-Range Goal: Disease management   Start Date: 05/11/2021  Expected End Date: 12/21/2022  This Visit's Progress: On track  Recent Progress: On track  Priority: High  Note:   Current Barriers:  Unable to independently afford treatment regimen  Pharmacist Clinical Goal(s):  Patient will verbalize ability to afford treatment regimen through collaboration with PharmD and provider.   Interventions: 1:1 collaboration with Ria Bush, MD regarding development and update of comprehensive plan of care as evidenced by provider attestation and co-signature Inter-disciplinary care team collaboration (see longitudinal plan of care) Comprehensive medication review performed; medication list updated in electronic medical record  Hypertension (BP goal <130/80) -Controlled - BP is at goal home, on avg; pt reports marked improvement in chest pain since starting metoprolol -ECHO 05/2020 - LVEF 40% and Grade 1 diastolic dysfunction (prior to CABG) -Denies hypotensive/hypertensive symptoms -Current home readings: n/a -Current treatment: Isosorbide [place] 60 mg -1/2 tab daily - Appropriate, Effective, Safe, Accessible Torsemide 20 mg daily - Appropriate, Effective, Safe, Accessible Metoprolol succinate 25 mg daily - Appropriate, Effective, Safe, Accessible -Educated on BP goals and benefits of medications for prevention of heart attack, stroke and kidney damage; Daily salt intake goal < 2300 mg; -Counseled that torsemide was added by cardiologist to help with SOB -Counseled to monitor BP at home daily -Recommended to continue current  medication  Hyperlipidemia: (LDL goal < 70) -Controlled - LDL is at goal; pt endorses compliance with statin and denies issues -Hx CAD, CABG 2012, 07/2020; [place] 11/09/2020 with no significant ischemia, fixed apical defect. -Current treatment: Simvastatin 40 mg daily - Appropriate, Effective, Safe, Accessible Aspirin 81 mg daily -Appropriate, Effective, Safe, Accessible -Educated on Cholesterol goals; Benefits of statin for ASCVD risk reduction; -Recommended to continue current medication  Diabetes (A1c goal <7%) -Controlled - A1c 6.2% ([id number]) at goal;  pt endorses compliance with metformin; recently she was prescribed Ozempic for DM, wt loss, CV protection but could not afford it -Current home glucose readings fasting glucose: n/a post prandial glucose: n/a -Denies hypoglycemic/hyperglycemic symptoms -Current medications: Metformin 500 mg -2 tab BID -Appropriate, Effective, Safe, Accessible Ozempic 0.25 mg weekly - not started yet (cost) Testing supplies -Medications previously tried: Novolin N, glimepiride, pioglitazone -Current exercise: step exercises - 4000 steps/day (limited d/t knees, SOB) -Assessed pt finances - she should qualify for Novo Cares PAP; she will bring proof of income to office and sign paperwork for Ozempic -Recommend to continue current medication  COPD (Goal: control symptoms and prevent exacerbations) -Controlled - Pt notes improvement since taking Breztri now that she gets it free through PAP; she did develop thrush recently and was treated with nystatin rinse, initially thrush improved but it does keep returning -Gold Grade: Gold 1 (FEV1>80%) -Current COPD Classification:  A (low sx, <2 exacerbations/yr) -MMRC/CAT score: not on file -Pulmonary function testing: 04/20/20 - FEV1 84% predicted, FEV1/FVC 0.76 -Exacerbations requiring treatment in last 6 months: 0 -Current treatment  Breztri 160-9-4.8 mcg/act 2 puffs BID (PAP) -Appropriate, Effective,  Safe, Accessible Albuterol HFA prn - Appropriate, Effective, Safe, Accessible Oxygen 2L HS -Medications previously tried: Trelegy ($$), Anoro, Symbciort  -Patient denies consistent use of maintenance  inhaler -Frequency of rescue inhaler use: every other day -Counseled pt is enrolled in [place] through 08/20/2022, at end of 2023 we can re-apply for following year -Counseled Breztri contained ICS which can cause thrush; advised to rinse mouth with mouthwash following every use of Breztri -Recommend to continue current medication  Bipolar disorder (Goal: manage symptoms) -not addressed 02/15/22 -Controlled - Pt reports "ziprasidone is a life saver"; she never wants to change this med -Current treatment: Ziprasidone 40 mg BID -Appropriate, Effective, Safe, Accessible -Medications previously tried/failed: quetiapine, diazepam -Connected with PCP for mental health support -Recommended to continue current medication  Migraines (Goal: prevent episodes) - not addressed 02/15/22 -Not ideally controlled - pt stopped Ajovy months ago because it was not helping and it was very expensive -Pt has appt 07/19/21 but had to cancel due to husband's health issues -Current treatment  Cyclobenzaprine 10 mg PRN -Appropriate, Query Effective Excedrin PRN (APAP/caffeine/ASA) - Appropriate, Query Effective -Medications previously tried: Nurtec, topamax, Ajovy -Discussed that there are other medications available for migraine prevention that can be tried; pt to discuss her concerns with her neurologist  Pain (Goal: manage symptoms) -not addressed 02/15/22 -Not ideally controlled - pt reports gel injections in her knee were not helpful; she is meant to follow up with ortho for next steps but will have a hard time getting to any appts in immediate future due to husband's poor health -Pt requests refill for tramadol - she uses very seldom but back pain has been worse lately due to caring for her husband and sleeping on  couch while he is in living room in hospital bed;  -osteoarthritis of knee, cervical pain, fibromyalgia -Current treatment  Cyclobenzaprine 10 mg PRN -Appropriate, Query Effective Gabapentin 100 mg 1-3 cap HS -Appropriate, Query Effective Naproxen - 1 tab BID - Appropriate, Effective,  Query Safe Tylenol 650 mg - 2 tab BID -Appropriate, Query Effective Tramadol 50 mg PRN (last rx 08/11/21) -Appropriate, Query Effective -Counseled on benefits/risks of increasing gabapentin, advised it is safe to try higher dose -Counseled on risks of chronic NSAID use -  advised her to use the lowest effective dose of NSAID to control her pain in order to reduce CV and kidney risks  Health Maintenance -Vaccine gaps: TDAP  Patient Goals/Self-Care Activities Patient will:  - take medications as prescribed focus on medication adherence by pill box -check glucose twice a day, document, and provide at future appointments -check blood pressure daily, document, and provide at future appointments      Patient verbalizes understanding of instructions and care plan provided today and agrees to view in [place]. Active MyChart status and patient understanding of how to access instructions and care plan via MyChart confirmed with patient.    Telephone follow up appointment with pharmacy team member scheduled for: 4 months  Charlene Brooke, PharmD, [facility] Clinical Pharmacist [place] [facility] at [facility] [phone]

## 2022-02-15 NOTE — Telephone Encounter (Signed)
Patient brought income documents to office and signed application. Will coordinate with PCP for MD signature.

## 2022-02-17 DIAGNOSIS — I10 Essential (primary) hypertension: Secondary | ICD-10-CM | POA: Diagnosis not present

## 2022-02-17 DIAGNOSIS — Z794 Long term (current) use of insulin: Secondary | ICD-10-CM | POA: Diagnosis not present

## 2022-02-17 DIAGNOSIS — E1159 Type 2 diabetes mellitus with other circulatory complications: Secondary | ICD-10-CM

## 2022-02-17 DIAGNOSIS — J449 Chronic obstructive pulmonary disease, unspecified: Secondary | ICD-10-CM

## 2022-02-17 DIAGNOSIS — E785 Hyperlipidemia, unspecified: Secondary | ICD-10-CM

## 2022-02-17 NOTE — Telephone Encounter (Signed)
See CCM encounter 02/15/22 - starting Ozempic PAP.

## 2022-02-22 DIAGNOSIS — M542 Cervicalgia: Secondary | ICD-10-CM | POA: Diagnosis not present

## 2022-02-22 DIAGNOSIS — M25511 Pain in right shoulder: Secondary | ICD-10-CM | POA: Diagnosis not present

## 2022-02-23 ENCOUNTER — Emergency Department: Payer: Medicare HMO

## 2022-02-23 ENCOUNTER — Other Ambulatory Visit: Payer: Self-pay

## 2022-02-23 ENCOUNTER — Observation Stay
Admission: EM | Admit: 2022-02-23 | Discharge: 2022-02-23 | Disposition: A | Discharge status: Home / Self Care | Payer: Medicare HMO | Attending: Internal Medicine | Admitting: Internal Medicine

## 2022-02-23 DIAGNOSIS — I200 Unstable angina: Secondary | ICD-10-CM | POA: Diagnosis not present

## 2022-02-23 DIAGNOSIS — R531 Weakness: Secondary | ICD-10-CM | POA: Diagnosis not present

## 2022-02-23 DIAGNOSIS — R0789 Other chest pain: Principal | ICD-10-CM | POA: Insufficient documentation

## 2022-02-23 DIAGNOSIS — J449 Chronic obstructive pulmonary disease, unspecified: Secondary | ICD-10-CM | POA: Diagnosis not present

## 2022-02-23 DIAGNOSIS — I2510 Atherosclerotic heart disease of native coronary artery without angina pectoris: Secondary | ICD-10-CM | POA: Diagnosis not present

## 2022-02-23 DIAGNOSIS — Z951 Presence of aortocoronary bypass graft: Secondary | ICD-10-CM | POA: Insufficient documentation

## 2022-02-23 DIAGNOSIS — R079 Chest pain, unspecified: Secondary | ICD-10-CM | POA: Diagnosis present

## 2022-02-23 DIAGNOSIS — R61 Generalized hyperhidrosis: Secondary | ICD-10-CM | POA: Diagnosis not present

## 2022-02-23 DIAGNOSIS — I209 Angina pectoris, unspecified: Secondary | ICD-10-CM | POA: Diagnosis not present

## 2022-02-23 LAB — BASIC METABOLIC PANEL
Anion gap: 11 (ref 5–15)
BUN: 13 mg/dL (ref 8–23)
CO2: 24 mmol/L (ref 22–32)
Calcium: 9.7 mg/dL (ref 8.9–10.3)
Chloride: 104 mmol/L (ref 98–111)
Creatinine, Ser: 0.7 mg/dL (ref 0.44–1.00)
GFR, Estimated: >60 mL/min (ref >60)
Glucose, Bld: 155 mg/dL — ABNORMAL HIGH (ref 70–99)
Potassium: 4.1 mmol/L (ref 3.5–5.1)
Sodium: 139 mmol/L (ref 135–145)

## 2022-02-23 LAB — TROPONIN I (HIGH SENSITIVITY)
Troponin I (High Sensitivity): 5 ng/L (ref <18)
Troponin I (High Sensitivity): 5 ng/L (ref <18)

## 2022-02-23 LAB — CBC
HCT: 36.5 % (ref 36.0–46.0)
Hemoglobin: 11.5 g/dL — ABNORMAL LOW (ref 12.0–15.0)
MCH: 26.7 pg (ref 26.0–34.0)
MCHC: 31.5 g/dL (ref 30.0–36.0)
MCV: 84.9 fL (ref 80.0–100.0)
Platelets: 433 10*3/uL — ABNORMAL HIGH (ref 150–400)
RBC: 4.3 MIL/uL (ref 3.87–5.11)
RDW: 14.8 % (ref 11.5–15.5)
WBC: 16.4 10*3/uL — ABNORMAL HIGH (ref 4.0–10.5)
nRBC: 0 % (ref 0.0–0.2)

## 2022-02-23 NOTE — ED Triage Notes (Signed)
Pt brought down from [facility] , states she was in the lobby waiting for her son that was having a colonoscopy today and suddenly had SOB with chest pressure, diaphoresis and feeling like she was going to pass out. States she used her inhaler with no relief. Has a hx of cardiac bypass about a year and a half ago.

## 2022-02-23 NOTE — ED Provider Notes (Addendum)
[facility] Provider Note    Event Date/Time   First MD Initiated Contact with Patient 02/23/22 [id number]     (approximate)   History   Shortness of Breath and Chest Pain   HPI  [name] is a 71 y.o. female who is here with her son who is getting his colonoscopy.  She was pushing the wheelchair and got a little ways and had sudden onset of chest pain and shortness of breath.  The pain was pressure and tightness in the anterior chest did not radiate.  She also had some sweating but not really nausea with it.  Patient past medical history is not significant for diabetes hypertension COPD and coronary artery disease.   Physical Exam   Triage Vital Signs: ED Triage Vitals  Enc Vitals Group     BP 02/23/22 0806 (!) 154/94     Pulse Rate 02/23/22 0806 96     Resp 02/23/22 0806 20     Temp 02/23/22 0806 98.1 F (36.7 C)     Temp Source 02/23/22 0806 Oral     SpO2 02/23/22 0806 98 %     Weight 02/23/22 0807 228 lb (103.4 kg)     Height 02/23/22 0807 '5\' 2"'$  (1.575 m)     Head Circumference --      Peak Flow --      Pain Score 02/23/22 0807 4     Pain Loc --      Pain Edu? --      Excl. in [place]? --     Most recent vital signs: Vitals:   02/23/22 1100 02/23/22 1130  BP: 126/73 126/72  Pulse: 66 68  Resp: 18 18  Temp:    SpO2: 100% 98%     General: Awake, no distress.  Chest: There is some tenderness in the anterior chest just to the left of the sternum palpation there exactly reproduces her pain. CV:  Good peripheral perfusion.  Heart regular rate and rhythm no audible murmurs Resp:  Normal effort.  Lungs are clear Abd:  No distention.  Soft nontender    ED Results / Procedures / Treatments   Labs (all labs ordered are listed, but only abnormal results are displayed) Labs Reviewed  BASIC METABOLIC PANEL - Abnormal; Notable for the following components:      Result Value   Glucose, Bld 155 (*)    All other components within normal  limits  CBC - Abnormal; Notable for the following components:   WBC 16.4 (*)    Hemoglobin 11.5 (*)    Platelets 433 (*)    All other components within normal limits  TROPONIN I (HIGH SENSITIVITY)  TROPONIN I (HIGH SENSITIVITY)     EKG  EKG read and interpreted by me shows normal sinus rhythm with some premature beats things that look like PACs.  Rate is 92 patient has normal axis decreased R wave progression no acute changes similar to prior EKG   RADIOLOGY Chest x-ray read by radiology shows no acute disease   PROCEDURES:  Critical Care performed:   Procedures   MEDICATIONS ORDERED IN ED: Medications - No data to display   IMPRESSION / MDM / [place] / ED COURSE  I reviewed the triage vital signs and the nursing notes. Patient is doing well in the ER.  Differential diagnosis includes, but is not limited to, chest wall pain, angina which is the most likely worsening angina esophageal spasm On further questioning patient  reports that these chest tightness and palpitations and shortness of breath been getting worse every day for the last several days.  This sounds a lot more like crescendo angina. Patient's presentation is most consistent with exacerbation of chronic illness.  The patient is on the cardiac monitor to evaluate for evidence of arrhythmia and/or significant heart rate changes.  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   02/23/22 1135             Note:  This document was prepared using Dragon voice recognition software and may include unintentional dictation errors.   Nena Polio, MD 02/23/22 1138    Nena Polio,  MD 02/23/22 1141 Discussed patient with Dr. Rockey Situ cardiology.  Patient just remembers that she was taken off of her isosorbide 60 and put on 30 just about the time that this all started.  Dr. Rockey Situ suggest making the isosorbide twice a day and possibly even putting the metoprolol in the evening or making the metoprolol twice a day as well.  We will try these patient really does not want to spend the night in the hospital.   Nena Polio, MD 02/23/22 1204

## 2022-02-23 NOTE — ED Notes (Signed)
Patient to xray at this time

## 2022-02-23 NOTE — Discharge Instructions (Addendum)
Your EKGs and lab work including troponin chest x-ray here were okay today.  The fact that your chest is tender and then reproduces the pain is a good sign but the on the other hand the fact that you got much worse with exercise is worrisome.  I would like you to follow-up with your cardiologist as soon as possible.  I have put in a cardiac referral also.  You should be able to get an appointment in the next few days.  If you get increasing pain or shortness of breath or chest tightness please do not hesitate to return immediately.  As you heard I was discussing your case with Dr. Rockey Situ who works with your cardiologist Dr. Garen Lah, he suggest that we make the isosorbide 30 twice a day and if you are having any trouble with lightheadedness or weakness to take the metoprolol in the evening.  We will do this since you really do not want to stay in the hospital but remember if your pain is getting worse or not going away quickly with rest I would come back again.  And do not forget to follow-up with cardiology as soon as you can.  They should call you with the appointment.

## 2022-02-24 ENCOUNTER — Encounter: Payer: Self-pay | Admitting: Family Medicine

## 2022-02-25 ENCOUNTER — Other Ambulatory Visit: Payer: Self-pay | Admitting: Family Medicine

## 2022-02-28 ENCOUNTER — Encounter: Payer: Self-pay | Admitting: Nurse Practitioner

## 2022-02-28 ENCOUNTER — Ambulatory Visit: Payer: Medicare HMO | Admitting: Nurse Practitioner

## 2022-02-28 ENCOUNTER — Encounter: Payer: Self-pay | Admitting: Family Medicine

## 2022-02-28 VITALS — BP 90/52 | HR 75 | Ht 62.0 in | Wt 225.6 lb

## 2022-02-28 DIAGNOSIS — I952 Hypotension due to drugs: Secondary | ICD-10-CM

## 2022-02-28 DIAGNOSIS — I200 Unstable angina: Secondary | ICD-10-CM | POA: Diagnosis not present

## 2022-02-28 DIAGNOSIS — E118 Type 2 diabetes mellitus with unspecified complications: Secondary | ICD-10-CM

## 2022-02-28 DIAGNOSIS — Z794 Long term (current) use of insulin: Secondary | ICD-10-CM

## 2022-02-28 DIAGNOSIS — I5022 Chronic systolic (congestive) heart failure: Secondary | ICD-10-CM | POA: Diagnosis not present

## 2022-02-28 DIAGNOSIS — E785 Hyperlipidemia, unspecified: Secondary | ICD-10-CM | POA: Diagnosis not present

## 2022-02-28 DIAGNOSIS — I255 Ischemic cardiomyopathy: Secondary | ICD-10-CM | POA: Diagnosis not present

## 2022-02-28 MED ORDER — TORSEMIDE 20 MG PO TABS: 10.0000 mg | ORAL_TABLET | ORAL | 3 refills | Status: DC

## 2022-02-28 NOTE — Progress Notes (Signed)
Office Visit    Patient Name: [name] Date of Encounter: 02/28/2022  Primary Care Provider:  Ria Bush, MD Primary Cardiologist:  Kate Sable, MD  Chief Complaint    71 year old female with a history of CAD, hypertension, hyperlipidemia, diabetes, heart failure with midrange ejection fraction, ischemic cardiomyopathy, remote tobacco abuse, COPD, sleep apnea, obesity, and GERD, who presents for follow-up related to chest pain.  Past Medical History    Past Medical History:  Diagnosis Date   Anxiety    Arthritis    per patient "all over body"   Bipolar disorder [facility])    has stopped seeing psychiatrist   CAD (coronary artery disease)    a. 06/2020 MV: small, sev, apical inf/apical defect-scar vs ischemia. EF 45%; b. 06/2020 Cath: LM 42m LAD 70ost, 965mLCX nl, RCA nl; c. 07/2020 CABG x 1: LIMA->LAD.   Cataract    Chronic bronchitis    COPD (chronic obstructive pulmonary disease) ([facility]   Depression    Dyspnea 2012   s/p pulm/cards w/u WNL, thought due to obesity/deconditioning   Fibromyalgia    GERD (gastroesophageal reflux disease)    HFmrEF (heart failure with mid-range ejection fraction) ([facility]   a. 05/2020 Echo: EF 40%, glob HK, mild LVH, Gr1 DD; b. 06/2020 EF by SPECT: 45%; c. 07/2020 intraop TEE: EF 50%.   History of chicken pox    HLD (hyperlipidemia)    Hypertension    Hypothyroidism    Ischemic cardiomyopathy    a. 05/2020 Echo: EF 40%; b. 06/2020 EF by SPECT: 45%; c. 07/2020 intraop TEE: EF 50%.   Migraine    OSA (obstructive sleep apnea) 03/16/2011   Pneumonia    Sleep apnea    per patient cannot tolerate CPAP   T2DM (type 2 diabetes mellitus) ([facility]   DM education 06/2011   Urinary incontinence    Past Surgical History:  Procedure Laterality Date   BREAST CYST ASPIRATION Right 2003   cardiac catherization  03/2011   x3 ([facility] mild nonobstructive CAD   COLONOSCOPY  12/2011   hyperplastic polyp x2,. diverticulosis, rec  rpt 10 yrs   CORONARY ARTERY BYPASS GRAFT N/A 08/03/2020   Procedure: CORONARY ARTERY BYPASS GRAFTING TIMES ONE USING LEFT INTERNAL MAMMARY ARTERY.;  Surgeon: BaGaye PollackMD;  Location: [facility] OR;  Service: Open Heart Surgery;  Laterality: N/A;   ESOPHAGOGASTRODUODENOSCOPY  2006   ESOPHAGOGASTRODUODENOSCOPY  05/2020   no esophageal abnormality to explain dysphagia - esophagus dilated. Erosive gastropathy - neg H pylori ([facility]  KNEE SURGERY  2006   left, torn meniscus   RIGHT/LEFT HEART CATH AND CORONARY ANGIOGRAPHY N/A 07/13/2020   Procedure: RIGHT/LEFT HEART CATH AND CORONARY ANGIOGRAPHY;  Surgeon: EnNelva BushMD;  [facility];  Service: Cardiovascular;  Laterality: N/A;   TEE WITHOUT CARDIOVERSION N/A 08/03/2020   Procedure: TRANSESOPHAGEAL ECHOCARDIOGRAM (TEE);  Surgeon: BaGaye PollackMD;  [facility] Service: Open Heart Surgery;  Laterality: N/A;   TOTAL ABDOMINAL HYSTERECTOMY  2004   fibroids, cervix remained   TOTAL KNEE ARTHROPLASTY Left 08/2012   [facility] [place] [facility]    Allergies  Allergies  Allergen Reactions   Crestor [Rosuvastatin] Other (See Comments)    myalgias   Lipitor [Atorvastatin] Other (See Comments)    Mood swings   Lithium Other (See Comments)    abd pain, n/v, was hospitalized   Quetiapine Other (See Comments)    Muscle twitching, muscle spasm  History of Present Illness    71 year old female with above complex past medical history including CAD, hypertension, hyperlipidemia, diabetes, heart failure with midrange ejection fraction, ischemic cardiomyopathy, remote tobacco abuse, COPD, sleep apnea, obesity, and GERD.  In the setting of generalized weakness and progressive dyspnea on exertion, she was seen by pulmonology in the fall 2021.  Echo was performed and showed an EF of 40%.  It was noted that imaging was suboptimal.  She was referred to cardiology with a repeat limited echo performed, though imaging remains  suboptimal.  Stress testing was undertaken and showed an EF of 45% with an area of apical inferior and apical scar versus ischemia.  Diagnostic catheterization in November 2021 showed a 70% ostial LAD stenosis along with 90% mid LAD stenosis.  She otherwise had nonobstructive disease.  She was referred to cardiothoracic surgery and subsequently underwent CABG x1 with LIMA to the LAD on August 03, 2020.  Of note EF was 50% by intraoperative TEE.  Postoperative course notable for anemia, though she did not require transfusion.  She was discharged home on supplemental oxygen, which was subsequently weaned off.  She was seen in the emergency department in March 2022 for chest pain, and placed on Imdur.  Stress testing was performed which showed a moderate fixed apical inferior and apical defect consistent with scar, without ischemia.  EF was 30 to 44%.  Imdur was titrated to 30 mg daily and later titrated to 60 mg daily in the setting of ongoing intermittent chest discomfort however, this was subsequently down titrated to 30 mg daily due to soft blood pressures.  In July 2022, she was placed on torsemide therapy in the setting of some volume overload.  [name] was last seen in cardiology clinic 01/20/2022, at which time she noted improvement in chest pain on imdur and metoprolol succinate.  Unfortunately, on July 6, she took her husband for colonoscopy here at [facility], and after using the bathroom and returning to the waiting room, she had onset of severe sharp and somewhat focal left chest pain associate with diaphoresis and dyspnea.  She was attended to by staff and taken down to the emergency department where ECG was unremarkable and troponins were normal.  Chest x-ray showed no acute abnormalities and aortic atherosclerosis.  Symptoms resolved over a period of about 45 minutes.  She was discharged home and has since been taking isosorbide 30 mg twice daily.  She notes chronic dyspnea on exertion with limited  activity.  She uses a walker to ambulate at home and thinks she gets about 3000 steps a day, but generally does not walk any long distances and certainly does not routinely exercise.  She denies palpitations, PND, orthopnea, syncope, edema, or early satiety.  She sometimes experiences orthostatic lightheadedness in the setting of chronically low blood pressures.  Home Medications    Current Outpatient Medications  Medication Sig Dispense Refill   acetaminophen (TYLENOL) 650 MG CR tablet Take 1,300 mg by mouth 2 (two) times daily.     albuterol (VENTOLIN HFA) 108 (90 Base) MCG/ACT inhaler Inhale 2 puffs into the lungs every 6 (six) hours as needed for wheezing or shortness of breath. 18 g 3   Apple Cider Vinegar 600 MG CAPS      aspirin 81 MG EC tablet Take 1 tablet (81 mg total) by mouth daily.     aspirin-acetaminophen-caffeine (EXCEDRIN MIGRAINE) 250-250-65 MG tablet Take by mouth every 6 (six) hours as needed for headache.  Boswellia-Glucosamine-Vit D (OSTEO BI-FLEX ONE PER DAY PO) Take 1 tablet by mouth daily.     Budeson-Glycopyrrol-Formoterol (BREZTRI AEROSPHERE) 160-9-4.8 MCG/ACT AERO Inhale 2 puffs into the lungs in the morning and at bedtime. 10.7 g 11   cholecalciferol (VITAMIN D3) 25 MCG (1000 UNIT) tablet Take 1,000 Units by mouth daily.     cyclobenzaprine (FLEXERIL) 10 MG tablet TAKE 1/2 TO 1 TABLET BY MOUTH TWICE A DAY AS NEEDED FOR MUSCLE SPASMS AND MIGRAINE 30 tablet 3   famotidine (PEPCID) 20 MG tablet Take 20 mg by mouth every other day. Alternates with omeprazole     gabapentin (NEURONTIN) 100 MG capsule Take 1 capsule (100 mg total) by mouth 2 (two) times daily.     levothyroxine (SYNTHROID) 100 MCG tablet TAKE 1 TABLET DAILY, AND ON2 DAYS A WEEK, TAKE AN     EXTRA 1/2 TABLET 90 tablet 2   loratadine (CLARITIN) 10 MG tablet Take 1 tablet (10 mg total) by mouth daily.     Melatonin 5 MG CAPS Take by mouth.     metFORMIN (GLUCOPHAGE) 500 MG tablet TAKE 2 TABLETS TWO TIMES A  DAY WITH MEALS 360 tablet 0   metoprolol succinate (TOPROL-XL) 25 MG 24 hr tablet TAKE 1 TABLET (25 MG TOTAL) BY MOUTH DAILY. (Patient taking differently: Take 25 mg by mouth at bedtime.) 90 tablet 1   Multiple Vitamin (MULTIVITAMIN WITH MINERALS) TABS tablet Take 1 tablet by mouth daily. One A Day for Women     mupirocin ointment (BACTROBAN) 2 % Apply 1 Application topically 2 (two) times daily. 15 g 0   naproxen sodium (ALEVE) 220 MG tablet Take 220 mg by mouth 2 (two) times daily with a meal.     nystatin (MYCOSTATIN) 100000 UNIT/ML suspension Take 5 mLs (500,000 Units total) by mouth 3 (three) times daily. 120 mL 0   nystatin cream (MYCOSTATIN) APPLY TO AFFECTED AREA TWICE A DAY 30 g 1   omeprazole (PRILOSEC) 20 MG capsule Take 1 capsule (20 mg total) by mouth daily. Take 1 daily-Alternates with famotidine 90 capsule 3   ONE TOUCH LANCETS MISC Use to check sugar twice daily Dx: E11.8 200 each 3   ONETOUCH ULTRA test strip USE TO CHECK BLOOD SUGAR 2 TIMES   DAILY 200 strip 3   OXYGEN Inhale 2 L into the lungs at bedtime.     Rhubarb (ESTROVEN COMPLETE PO) Take 1 tablet by mouth daily.     simvastatin (ZOCOR) 40 MG tablet TAKE 1 TABLET AT BEDTIME 90 tablet 0   torsemide (DEMADEX) 20 MG tablet Take 1 tablet (20 mg total) by mouth daily. 90 tablet 3   traMADol (ULTRAM) 50 MG tablet TAKE 1 TABLET (50 MG TOTAL) BY MOUTH TWICE A DAY AS NEEDED FOR MODERATE PAIN 30 tablet 0   vitamin B-12 (CYANOCOBALAMIN) 1000 MCG tablet Take 1,000 mcg by mouth daily.     ziprasidone (GEODON) 40 MG capsule TAKE 1 CAPSULE EVERY       MORNING AND AT BEDTIME 180 capsule 1   isosorbide mononitrate (IMDUR) 60 MG 24 hr tablet Take 1 tablet (60 mg total) by mouth daily. (Patient taking differently: Take 30 mg by mouth daily. 30 mg in the am and 30 in the pm) 90 tablet 1   Semaglutide,0.25 or 0.'5MG'$ /DOS, (OZEMPIC, 0.25 OR 0.5 MG/DOSE,) 2 MG/3ML SOPN Inject into the skin. Inject 0.25 mg weekly for 4 weeks, then increase to 0.5 mg  weekly     No current facility-administered medications  for this visit.     Review of Systems    Episode of chest pain as outlined above.  Chronic dyspnea exertion.  Occasional orthostatic lightheadedness.  She denies palpitations, PND, orthopnea, syncope, edema, or early satiety.  All other systems reviewed and are otherwise negative except as noted above.    Physical Exam    VS:  BP (!) 90/52 (BP Location: Right Arm, Patient Position: Sitting, Cuff Size: Large)   Pulse 75   Ht '5\' 2"'$  (1.575 m)   Wt 225 lb 9.6 oz (102.3 kg)   SpO2 94%   BMI 41.26 kg/m  , BMI Body mass index is 41.26 kg/m.     GEN: Obese, in no acute distress. HEENT: normal. Neck: Supple, no JVD, carotid bruits, or masses. Cardiac: RRR, no murmurs, rubs, or gallops. No clubbing, cyanosis, edema.  Radials/PT 2+ and equal bilaterally.  Respiratory:  Respirations regular and unlabored, clear to auscultation bilaterally. GI: Soft, nontender, nondistended, BS + x 4. MS: no deformity or atrophy. Skin: warm and dry, no rash. Neuro:  Strength and sensation are intact. Psych: Normal affect.  Accessory Clinical Findings    ECG personally reviewed by me today -regular sinus rhythm, 75, baseline artifact, LVH, mild inferolateral ST depression T wave inversions-more pronounced than on prior ECGs  Lab Results  Component Value Date   WBC 16.4 (H) 02/23/2022   HGB 11.5 (L) 02/23/2022   HCT 36.5 02/23/2022   MCV 84.9 02/23/2022   PLT 433 (H) 02/23/2022   Lab Results  Component Value Date   CREATININE 0.70 02/23/2022   BUN 13 02/23/2022   NA 139 02/23/2022   K 4.1 02/23/2022   CL 104 02/23/2022   CO2 24 02/23/2022   Lab Results  Component Value Date   ALT 13 11/11/2021   AST 14 11/11/2021   ALKPHOS 51 11/11/2021   BILITOT 0.2 11/11/2021   Lab Results  Component Value Date   CHOL 126 11/11/2021   HDL 38.50 (L) 11/11/2021   LDLCALC 48 12/08/2013   LDLDIRECT 53.0 11/11/2021   TRIG 297.0 (H) 11/11/2021    CHOLHDL 3 11/11/2021    Lab Results  Component Value Date   HGBA1C 6.2 (A) 02/13/2022    Assessment & Plan    1.  Unstable angina/coronary artery disease: Status post CABG x1 with a LIMA to the LAD in December 2021.  She subsequently underwent stress testing in March 2022 in the setting of chest pain, which was nonischemic.  She has been managed with beta-blocker and isosorbide therapy.  She has chronic, stable dyspnea on exertion.  She had an episode of chest discomfort on July 6 which was associated diaphoresis and dyspnea.  Symptoms lasted about 45 minutes prior to resolution the ED evaluation was notable for normal troponins and nonacute ECG.  Since then, she has been taking her isosorbide 30 mg twice a day instead of once a day (dose previously limited by relative hypotension).  She has not had any recurrent symptoms.  ECG today was slightly more pronounced inferolateral ST and T abnormalities in the setting of borderline LVH.  I will arrange for a Lexiscan Myoview to reassess for ischemia.  Continue current dose of beta-blocker and nitrate therapy and will reduce her torsemide to 10 mg once daily in order to make additional blood pressure room.  Continue aspirin and statin therapy.  2.  Chronic heart failure with midrange ejection fraction/ischemic cardiomyopathy: Euvolemic on examination.  GDMT limited by soft blood pressures.  She is  currently on metoprolol succinate and torsemide 20 mg daily.  Euvolemic on examination, which she notes she has been for some time.  In effort to provide a little more blood pressure room, I will reduce her torsemide to 10 mg once daily she can take an additional 10 mg as needed for swelling or weight gain.  3.  Essential hypertension/relative hypotension: She continues to experience soft blood pressures.  Reducing torsemide as outlined above.  4.  Hyperlipidemia: LDL of 53 earlier this year with normal LFTs.  Continue simvastatin therapy.  5.  Type 2 diabetes  mellitus: A1c 6.2 last month.  She is on metformin and is followed closely by primary care.  6.  Disposition: Follow-up Lexiscan Myoview.  Follow-up in clinic in 1 month or sooner if necessary.   Murray Hodgkins, NP 02/28/2022, 9:05 AM

## 2022-02-28 NOTE — Telephone Encounter (Signed)
Flexeril Last filled:  01/08/22, #30 Last OV:  02/13/22, f/u Next OV:  05/16/22, f/u

## 2022-02-28 NOTE — Patient Instructions (Signed)
Medication Instructions:  Your physician has recommended you make the following change in your medication:   DECREASE Torsemide to one half tablet (10 mg) once daily with extra half a tablet as needed for weight gain or shortness of breath.   *If you need a refill on your cardiac medications before your next appointment, please call your pharmacy*   Lab Work: None  If you have labs (blood work) drawn today and your tests are completely normal, you will receive your results only by: [place] (if you have MyChart) OR A paper copy in the mail If you have any lab test that is abnormal or we need to change your treatment, we will call you to review the results.   Testing/Procedures: [place]  Your caregiver has ordered a Stress Test with nuclear imaging. The purpose of this test is to evaluate the blood supply to your heart muscle. This procedure is referred to as a "Non-Invasive Stress Test." This is because other than having an IV started in your vein, nothing is inserted or "invades" your body. Cardiac stress tests are done to find areas of poor blood flow to the heart by determining the extent of coronary artery disease (CAD). Some patients exercise on a treadmill, which naturally increases the blood flow to your heart, while others who are  unable to walk on a treadmill due to physical limitations have a pharmacologic/chemical stress agent called Lexiscan . This medicine will mimic walking on a treadmill by temporarily increasing your coronary blood flow.   Please note: these test may take anywhere between 2-4 hours to complete  PLEASE REPORT TO [facility] AT THE FIRST DESK WILL DIRECT YOU WHERE TO GO  Date of Procedure:____________________  Arrival Time for Procedure:___________________________   Instructions regarding medication:   _XX___ : Hold diabetes medication morning of procedure  _XX___:  Hold Torsemide the morning of procedure    _XX___:  You may take all of your remaining medications with small sip of water with your medications.     PLEASE NOTIFY THE OFFICE AT LEAST 13 HOURS IN ADVANCE IF YOU ARE UNABLE TO KEEP YOUR APPOINTMENT.  [phone] AND  PLEASE NOTIFY NUCLEAR MEDICINE AT [facility] AT LEAST 24 HOURS IN ADVANCE IF YOU ARE UNABLE TO KEEP YOUR APPOINTMENT. [phone]  How to prepare for your Myoview test:  Do not eat or drink after midnight No caffeine for 24 hours prior to test No smoking 24 hours prior to test. Your medication may be taken with water.  If your doctor stopped a medication because of this test, do not take that medication. Ladies, please do not wear dresses.  Skirts or pants are appropriate. Please wear a short sleeve shirt. No perfume, cologne or lotion. Wear comfortable walking shoes. No heels!   Follow-Up: At [facility], you and your health needs are our priority.  As part of our continuing mission to provide you with exceptional heart care, we have created designated Provider Care Teams.  These Care Teams include your primary Cardiologist (physician) and Advanced Practice Providers (APPs -  Physician Assistants and Nurse Practitioners) who all work together to provide you with the care you need, when you need it.  Your next appointment:   1 month(s)  The format for your next appointment:   In Person  Provider:   Kate Sable, MD or Murray Hodgkins, NP       Important Information About Sugar

## 2022-03-01 ENCOUNTER — Telehealth: Payer: Self-pay

## 2022-03-01 NOTE — Progress Notes (Addendum)
Chronic Care Management Pharmacy Assistant   Name: [name]  MRN: [id number] DOB: 1950-09-13  Reason for Encounter: CCM [facility] Follow Up)   Medications: Outpatient Encounter Medications as of 03/01/2022  Medication Sig   acetaminophen (TYLENOL) 650 MG CR tablet Take 1,300 mg by mouth 2 (two) times daily.   albuterol (VENTOLIN HFA) 108 (90 Base) MCG/ACT inhaler Inhale 2 puffs into the lungs every 6 (six) hours as needed for wheezing or shortness of breath.   Apple Cider Vinegar 600 MG CAPS    aspirin 81 MG EC tablet Take 1 tablet (81 mg total) by mouth daily.   aspirin-acetaminophen-caffeine (EXCEDRIN MIGRAINE) 250-250-65 MG tablet Take by mouth every 6 (six) hours as needed for headache.   Boswellia-Glucosamine-Vit D (OSTEO BI-FLEX ONE PER DAY PO) Take 1 tablet by mouth daily.   Budeson-Glycopyrrol-Formoterol (BREZTRI AEROSPHERE) 160-9-4.8 MCG/ACT AERO Inhale 2 puffs into the lungs in the morning and at bedtime.   cholecalciferol (VITAMIN D3) 25 MCG (1000 UNIT) tablet Take 1,000 Units by mouth daily.   cyclobenzaprine (FLEXERIL) 10 MG tablet TAKE 1/2 TO 1 TABLET BY MOUTH TWICE A DAY AS NEEDED FOR MUSCLE SPASMS AND MIGRAINE   famotidine (PEPCID) 20 MG tablet Take 20 mg by mouth every other day. Alternates with omeprazole   gabapentin (NEURONTIN) 100 MG capsule Take 1 capsule (100 mg total) by mouth 2 (two) times daily.   isosorbide mononitrate (IMDUR) 60 MG 24 hr tablet Take 1 tablet (60 mg total) by mouth daily. (Patient taking differently: Take 30 mg by mouth daily. 30 mg in the am and 30 in the pm)   levothyroxine (SYNTHROID) 100 MCG tablet TAKE 1 TABLET DAILY, AND ON2 DAYS A WEEK, TAKE AN     EXTRA 1/2 TABLET   loratadine (CLARITIN) 10 MG tablet Take 1 tablet (10 mg total) by mouth daily.   Melatonin 5 MG CAPS Take by mouth.   metFORMIN (GLUCOPHAGE) 500 MG tablet TAKE 2 TABLETS TWO TIMES A DAY WITH MEALS   metoprolol succinate (TOPROL-XL) 25 MG 24 hr tablet TAKE 1 TABLET (25  MG TOTAL) BY MOUTH DAILY. (Patient taking differently: Take 25 mg by mouth at bedtime.)   Multiple Vitamin (MULTIVITAMIN WITH MINERALS) TABS tablet Take 1 tablet by mouth daily. One A Day for Women   mupirocin ointment (BACTROBAN) 2 % Apply 1 Application topically 2 (two) times daily.   naproxen sodium (ALEVE) 220 MG tablet Take 220 mg by mouth 2 (two) times daily with a meal.   nystatin (MYCOSTATIN) 100000 UNIT/ML suspension Take 5 mLs (500,000 Units total) by mouth 3 (three) times daily.   nystatin cream (MYCOSTATIN) APPLY TO AFFECTED AREA TWICE A DAY   omeprazole (PRILOSEC) 20 MG capsule Take 1 capsule (20 mg total) by mouth daily. Take 1 daily-Alternates with famotidine   ONE TOUCH LANCETS MISC Use to check sugar twice daily Dx: E11.8   ONETOUCH ULTRA test strip USE TO CHECK BLOOD SUGAR 2 TIMES   DAILY   OXYGEN Inhale 2 L into the lungs at bedtime.   Rhubarb (ESTROVEN COMPLETE PO) Take 1 tablet by mouth daily.   Semaglutide,0.25 or 0.'5MG'$ /DOS, (OZEMPIC, 0.25 OR 0.5 MG/DOSE,) 2 MG/3ML SOPN Inject into the skin. Inject 0.25 mg weekly for 4 weeks, then increase to 0.5 mg weekly   simvastatin (ZOCOR) 40 MG tablet TAKE 1 TABLET AT BEDTIME   torsemide (DEMADEX) 20 MG tablet Take 0.5 tablets (10 mg total) by mouth as directed. Take one half tablet (10 mg) once  a day and may take additional one half tablet (10 mg) as needed for weight gain or shortness of breath.   traMADol (ULTRAM) 50 MG tablet TAKE 1 TABLET (50 MG TOTAL) BY MOUTH TWICE A DAY AS NEEDED FOR MODERATE PAIN   vitamin B-12 (CYANOCOBALAMIN) 1000 MCG tablet Take 1,000 mcg by mouth daily.   ziprasidone (GEODON) 40 MG capsule TAKE 1 CAPSULE EVERY       MORNING AND AT BEDTIME   No facility-administered encounter medications on file as of 03/01/2022.     Reviewed hospital notes for details of recent visit. Patient has been contacted by Transitions of Care team: No  Admitted to the ED on 02/23/2022. Discharge date was 02/23/2022. Discharged  from [facility].   Primary discharge diagnosis: Atypical chest pain and Crescendo Angina Patient was discharged to Home  Brief summary of hospital course: Patient was at the hospital with son who was getting his colonoscopy and pushing his wheelchair. and had sudden onset of chest pain and shortness of breath.  The pain was pressure and tightness in the anterior chest did not radiate.  She also had some sweating but not really nausea with it. Procedures: EKG and Chest X-ray; normal sinus rhythm with some premature beats things that look like PACs.  Rate is 92 patient has normal axis decreased R wave progression no acute changes similar to prior EKG; Chest x-ray read by radiology shows no acute disease. Referral to Cardiology.  Per ED provider: "Discussed patient with Dr. Rockey Situ cardiology.  Patient just remembers that she was taken off of her isosorbide 60 and put on 30 just about the time that this all started.  Dr. Rockey Situ suggest making the isosorbide twice a day and possibly even putting the metoprolol in the evening or making the metoprolol twice a day as well"  New?Medications Started at [facility] Discharge:?? No new medications started  Medication Changes at Hospital Discharge: -Changed Isosorbide 30 BID  -Changed Metoprolol 25 mg (if having lightheadedness or weakness) take in evening  Medications Discontinued at Hospital Discharge: No new medications discontinued  Medications that remain the same after Hospital Discharge:??  -All other medications will remain the same.    Nuclear Medicine appointment on 03/14/22 Cardiology appointment on 04/05/22 PCP appointment on 05/16/22  Charlene Brooke, PharmD notified and will determine if action is needed.

## 2022-03-01 NOTE — Telephone Encounter (Signed)
Patient seen in ED 7/6 for chest pain. Isosorbide was increased to 30 mg BID. She saw cardiologist Dr Sharolyn Douglas 02/28/22. Torsemide was reduced to 1/2 tablet due to hypotension (90/52) and ordered stress test - scheduled for 7/25.  No further action needed at this time. Scheduled earlier CCM appt for 03/28/22 to follow up.

## 2022-03-01 NOTE — Progress Notes (Signed)
    Chronic Care Management Pharmacy Assistant   Name: [name]  MRN: [id number] DOB: October 02, 1950  Reason for Encounter: CCM (Ozempic 2023 PAP Approval)   Patient has been approved through 08/20/2022 for patient assistance for Ozempic [employer]) as of 03/01/2022. Medication will be delivered to the office within 10-14 business days of today's date. Patient is on auto-refill. Called patient to inform her; patient expressed understanding. Patient stated Ziprasidone is also expensive for her. I researched and there is not a patient assistance program for Ziprasidone at this time.   Charlene Brooke, CPP notified  Marijean Niemann, [place] Clinical Pharmacy Assistant [phone]

## 2022-03-06 DIAGNOSIS — J449 Chronic obstructive pulmonary disease, unspecified: Secondary | ICD-10-CM | POA: Diagnosis not present

## 2022-03-14 ENCOUNTER — Other Ambulatory Visit: Payer: Medicare HMO

## 2022-03-17 ENCOUNTER — Encounter: Payer: Self-pay | Admitting: Family Medicine

## 2022-03-21 ENCOUNTER — Encounter
Admission: RE | Admit: 2022-03-21 | Discharge: 2022-03-21 | Disposition: A | Discharge status: Home / Self Care | Payer: Medicare HMO | Source: Ambulatory Visit | Attending: Nurse Practitioner | Admitting: Nurse Practitioner

## 2022-03-21 ENCOUNTER — Encounter: Payer: Self-pay | Admitting: Family Medicine

## 2022-03-21 DIAGNOSIS — I200 Unstable angina: Secondary | ICD-10-CM | POA: Insufficient documentation

## 2022-03-21 MED ORDER — REGADENOSON 0.4 MG/5ML IV SOLN
0.4000 mg | Freq: Once | INTRAVENOUS | Status: AC
Start: 2022-03-21 — End: 2022-03-21
  Administered 2022-03-21: 0.4 mg via INTRAVENOUS

## 2022-03-21 MED ORDER — TECHNETIUM TC 99M TETROFOSMIN IV KIT
9.8600 | PACK | Freq: Once | INTRAVENOUS | Status: AC | PRN
  Administered 2022-03-21: 9.86 via INTRAVENOUS

## 2022-03-21 MED ORDER — TECHNETIUM TC 99M TETROFOSMIN IV KIT
31.5300 | PACK | Freq: Once | INTRAVENOUS | Status: AC | PRN
  Administered 2022-03-21: 31.53 via INTRAVENOUS

## 2022-03-22 ENCOUNTER — Telehealth: Payer: Self-pay

## 2022-03-22 LAB — NM MYOCAR MULTI W/SPECT W/WALL MOTION / EF
LV dias vol: 111 mL (ref 46–106)
LV sys vol: 74 mL
Nuc Stress EF: 33 %
Peak HR: 96 {beats}/min
Percent HR: 63 %
Rest HR: 73 {beats}/min
Rest Nuclear Isotope Dose: 9.9 mCi
SDS: 2
SRS: 13
SSS: 13
ST Depression (mm): 0 mm
Stress Nuclear Isotope Dose: 31.5 mCi
TID: 0.71

## 2022-03-22 NOTE — Telephone Encounter (Signed)
Pt came in office to pick up Medication

## 2022-03-22 NOTE — Telephone Encounter (Addendum)
Received med shipment for pt's Ozempic 0.25/0.5 mg inj pen (5 boxes total).    Spoke with pt notifying her Ozempic shipment is ready to pick up.    [Placed Ozempic (5 boxes total) in 3rd refrigerator, 2nd shelf.]

## 2022-03-22 NOTE — Telephone Encounter (Signed)
Met with patient to go over how to use Ozempic. Patient successfully injected first dose today - 0.25 mg. Advised pt to continue 0.25 mg once a week (Wednesdays) for 3 more weeks, then if she is tolerating it, increase dose to 0.5 mg weekly. Pt voiced understanding.

## 2022-03-23 ENCOUNTER — Telehealth: Payer: Self-pay | Admitting: *Deleted

## 2022-03-23 ENCOUNTER — Telehealth: Payer: Self-pay

## 2022-03-23 DIAGNOSIS — I255 Ischemic cardiomyopathy: Secondary | ICD-10-CM

## 2022-03-23 NOTE — Progress Notes (Signed)
Chronic Care Management Pharmacy Assistant   Name: [name]  MRN: [id number] DOB: [birth year]  Reason for Encounter: CCM (Appointment Reminder)  Medications: Outpatient Encounter Medications as of 03/23/2022  Medication Sig Note   acetaminophen (TYLENOL) 650 MG CR tablet Take 1,300 mg by mouth 2 (two) times daily.    albuterol (VENTOLIN HFA) 108 (90 Base) MCG/ACT inhaler Inhale 2 puffs into the lungs every 6 (six) hours as needed for wheezing or shortness of breath.    Apple Cider Vinegar 600 MG CAPS     aspirin 81 MG EC tablet Take 1 tablet (81 mg total) by mouth daily.    aspirin-acetaminophen-caffeine (EXCEDRIN MIGRAINE) 250-250-65 MG tablet Take by mouth every 6 (six) hours as needed for headache.    Boswellia-Glucosamine-Vit D (OSTEO BI-FLEX ONE PER DAY PO) Take 1 tablet by mouth daily.    Budeson-Glycopyrrol-Formoterol (BREZTRI AEROSPHERE) 160-9-4.8 MCG/ACT AERO Inhale 2 puffs into the lungs in the morning and at bedtime.    cholecalciferol (VITAMIN D3) 25 MCG (1000 UNIT) tablet Take 1,000 Units by mouth daily.    cyclobenzaprine (FLEXERIL) 10 MG tablet TAKE 1/2 TO 1 TABLET BY MOUTH TWICE A DAY AS NEEDED FOR MUSCLE SPASMS AND MIGRAINE    famotidine (PEPCID) 20 MG tablet Take 20 mg by mouth every other day. Alternates with omeprazole    gabapentin (NEURONTIN) 100 MG capsule Take 1 capsule (100 mg total) by mouth 2 (two) times daily.    isosorbide mononitrate (IMDUR) 60 MG 24 hr tablet Take 1 tablet (60 mg total) by mouth daily. (Patient taking differently: Take 30 mg by mouth daily. 30 mg in the am and 30 in the pm)    levothyroxine (SYNTHROID) 100 MCG tablet TAKE 1 TABLET DAILY, AND ON2 DAYS A WEEK, TAKE AN     EXTRA 1/2 TABLET    loratadine (CLARITIN) 10 MG tablet Take 1 tablet (10 mg total) by mouth daily.    Melatonin 5 MG CAPS Take by mouth.    metFORMIN (GLUCOPHAGE) 500 MG tablet TAKE 2 TABLETS TWO TIMES A DAY WITH MEALS    metoprolol succinate (TOPROL-XL) 25 MG 24 hr  tablet TAKE 1 TABLET (25 MG TOTAL) BY MOUTH DAILY. (Patient taking differently: Take 25 mg by mouth at bedtime.)    Multiple Vitamin (MULTIVITAMIN WITH MINERALS) TABS tablet Take 1 tablet by mouth daily. One A Day for Women    mupirocin ointment (BACTROBAN) 2 % Apply 1 Application topically 2 (two) times daily.    naproxen sodium (ALEVE) 220 MG tablet Take 220 mg by mouth 2 (two) times daily with a meal.    nystatin (MYCOSTATIN) 100000 UNIT/ML suspension Take 5 mLs (500,000 Units total) by mouth 3 (three) times daily.    nystatin cream (MYCOSTATIN) APPLY TO AFFECTED AREA TWICE A DAY    omeprazole (PRILOSEC) 20 MG capsule Take 1 capsule (20 mg total) by mouth daily. Take 1 daily-Alternates with famotidine    ONE TOUCH LANCETS MISC Use to check sugar twice daily Dx: E11.8    ONETOUCH ULTRA test strip USE TO CHECK BLOOD SUGAR 2 TIMES   DAILY    OXYGEN Inhale 2 L into the lungs at bedtime.    Rhubarb (ESTROVEN COMPLETE PO) Take 1 tablet by mouth daily.    Semaglutide,0.25 or 0.'5MG'$ /DOS, (OZEMPIC, 0.25 OR 0.5 MG/DOSE,) 2 MG/3ML SOPN Inject into the skin. Inject 0.25 mg weekly for 4 weeks, then increase to 0.5 mg weekly 03/22/2022: First dose 03/22/22   simvastatin (ZOCOR) 40 MG  tablet TAKE 1 TABLET AT BEDTIME    torsemide (DEMADEX) 20 MG tablet Take 0.5 tablets (10 mg total) by mouth as directed. Take one half tablet (10 mg) once a day and may take additional one half tablet (10 mg) as needed for weight gain or shortness of breath.    traMADol (ULTRAM) 50 MG tablet TAKE 1 TABLET (50 MG TOTAL) BY MOUTH TWICE A DAY AS NEEDED FOR MODERATE PAIN    vitamin B-12 (CYANOCOBALAMIN) 1000 MCG tablet Take 1,000 mcg by mouth daily.    ziprasidone (GEODON) 40 MG capsule TAKE 1 CAPSULE EVERY       MORNING AND AT BEDTIME    No facility-administered encounter medications on file as of 03/23/2022.   Patient's appointment has been rescheduled to 04/19/2022 at 10:00 am.   CCM referral has been placed prior to visit?  Yes    Star Rating Drugs: Medication:  Last Fill: Day Supply Metformin 500 mg 12/22/21 90 Ozempic 0.25 mg PAP Simvastatin 40 mg 12/21/21 [place], CPP notified  Marijean Niemann, [place] Pharmacy Assistant [phone]

## 2022-03-23 NOTE — Telephone Encounter (Signed)
-----   Message from Rise Mu, PA-C sent at 03/23/2022  7:49 AM EDT ----- Please inform the patient her stress test was intermediate risk without any obvious ischemia or scar.  Pump function was calculated to be low, though sometimes this is not the best study to calculate an EF.  Please order an echo under Emeline Gins name to follow-up EF.  She should follow-up with Ignacia Bayley, NP as scheduled on 04/05/2022 to reassess symptoms and discuss if further testing is indicated.

## 2022-03-23 NOTE — Telephone Encounter (Signed)
Results and recommendations reviewed with patient and scheduled for the echocardiogram. She verbalized understanding with no further questions at this time.

## 2022-03-24 ENCOUNTER — Ambulatory Visit (INDEPENDENT_AMBULATORY_CARE_PROVIDER_SITE_OTHER): Payer: Medicare HMO

## 2022-03-24 DIAGNOSIS — I255 Ischemic cardiomyopathy: Secondary | ICD-10-CM

## 2022-03-24 LAB — ECHOCARDIOGRAM COMPLETE
AR max vel: 2.59 cm2
AV Area VTI: 2.69 cm2
AV Area mean vel: 2.27 cm2
AV Mean grad: 4.5 mmHg
AV Peak grad: 6.9 mmHg
Ao pk vel: 1.31 m/s
S' Lateral: 3.4 cm

## 2022-03-24 MED ORDER — PERFLUTREN LIPID MICROSPHERE
1.0000 mL | INTRAVENOUS | Status: AC | PRN
  Administered 2022-03-24: 2 mL via INTRAVENOUS

## 2022-03-28 ENCOUNTER — Telehealth: Payer: Medicare HMO

## 2022-04-05 ENCOUNTER — Encounter: Payer: Self-pay | Admitting: Nurse Practitioner

## 2022-04-05 ENCOUNTER — Ambulatory Visit: Payer: Medicare HMO | Admitting: Nurse Practitioner

## 2022-04-05 VITALS — BP 122/50 | HR 79 | Ht 62.0 in | Wt 226.2 lb

## 2022-04-05 DIAGNOSIS — I25118 Atherosclerotic heart disease of native coronary artery with other forms of angina pectoris: Secondary | ICD-10-CM | POA: Diagnosis not present

## 2022-04-05 DIAGNOSIS — E118 Type 2 diabetes mellitus with unspecified complications: Secondary | ICD-10-CM | POA: Diagnosis not present

## 2022-04-05 DIAGNOSIS — I5032 Chronic diastolic (congestive) heart failure: Secondary | ICD-10-CM | POA: Diagnosis not present

## 2022-04-05 DIAGNOSIS — I10 Essential (primary) hypertension: Secondary | ICD-10-CM

## 2022-04-05 DIAGNOSIS — E785 Hyperlipidemia, unspecified: Secondary | ICD-10-CM | POA: Diagnosis not present

## 2022-04-05 NOTE — Patient Instructions (Signed)
Medication Instructions:   Your physician recommends that you continue on your current medications as directed. Please refer to the Current Medication list given to you today.  *If you need a refill on your cardiac medications before your next appointment, please call your pharmacy*   Lab Work:  None ordered  Testing/Procedures:  None ordered   Follow-Up: At [facility], you and your health needs are our priority.  As part of our continuing mission to provide you with exceptional heart care, we have created designated Provider Care Teams.  These Care Teams include your primary Cardiologist (physician) and Advanced Practice Providers (APPs -  Physician Assistants and Nurse Practitioners) who all work together to provide you with the care you need, when you need it.  We recommend signing up for the patient portal called "MyChart".  Sign up information is provided on this After Visit Summary.  MyChart is used to connect with patients for Virtual Visits (Telemedicine).  Patients are able to view lab/test results, encounter notes, upcoming appointments, etc.  Non-urgent messages can be sent to your provider as well.   To learn more about what you can do with MyChart, go to [email or web address].    Your next appointment:   3 month(s)  The format for your next appointment:   In Person  Provider:   You may see Kate Sable, MD or one of the following Advanced Practice Providers on your designated Care Team:   Murray Hodgkins, NP   Important Information About Sugar

## 2022-04-05 NOTE — Progress Notes (Signed)
Office Visit    Patient Name: ALEYSSA PIKE Date of Encounter: 04/05/2022  Primary Care Provider:  Ria Bush, MD Primary Cardiologist:  Kate Sable, MD  Chief Complaint    71 year old female with history of CAD, hypertension, hyperlipidemia, diabetes, heart failure with midrange ejection fraction, ischemic cardiomyopathy, remote tobacco abuse, COPD, sleep apnea, obesity, and GERD, who presents for follow-up related to chest pain and recent abnormal stress test.  Past Medical History    Past Medical History:  Diagnosis Date   Anxiety    Arthritis    per patient "all over body"   Bipolar disorder [facility])    has stopped seeing psychiatrist   CAD (coronary artery disease)    a. 06/2020 MV: small, sev, apical inf/apical defect-scar vs ischemia. EF 45%; b. 06/2020 Cath: LM 81m LAD 70ost, 956mLCX nl, RCA nl; c. 07/2020 CABG x 1: LIMA->LAD; d. 03/2022 MV: Significantly degraded imaging due to breast attenuation/obesity/extracardiac activity. No obvious isch/scar. EF 30-35% (EF 50-55% w/o rwma on f/u echo).   Cataract    Chronic bronchitis    COPD (chronic obstructive pulmonary disease) ([place]   Depression    Dyspnea 2012   s/p pulm/cards w/u WNL, thought due to obesity/deconditioning   Fibromyalgia    GERD (gastroesophageal reflux disease)    HFmrEF (heart failure with mid-range ejection fraction) ([place]   a. 05/2020 Echo: EF 40%, glob HK, mild LVH, Gr1 DD; b. 06/2020 EF by SPECT: 45%; c. 07/2020 intraop TEE: EF 50%; d. 03/2022 Echo: EF 50-55%, no rwma, GrI DD, mild LVH, nl RV fxn, mild MR.   History of chicken pox    HLD (hyperlipidemia)    Hypertension    Hypothyroidism    Ischemic cardiomyopathy    a. 05/2020 Echo: EF 40%; b. 06/2020 EF by SPECT: 45%; c. 07/2020 intraop TEE: EF 50%.   Migraine    OSA (obstructive sleep apnea) 03/16/2011   Pneumonia    Sleep apnea    per patient cannot tolerate CPAP   T2DM (type 2 diabetes mellitus) ([facility]   DM education  06/2011   Urinary incontinence    Past Surgical History:  Procedure Laterality Date   BREAST CYST ASPIRATION Right 2003   cardiac catherization  03/2011   x3 ([place]enn[place] mild nonobstructive CAD   COLONOSCOPY  12/2011   hyperplastic polyp x2,. diverticulosis, rec rpt 10 yrs   CORONARY ARTERY BYPASS GRAFT N/A 08/03/2020   Procedure: CORONARY ARTERY BYPASS GRAFTING TIMES ONE USING LEFT INTERNAL MAMMARY ARTERY.;  Surgeon: BaGaye PollackMD;  Location: [facility] OR;  Service: Open Heart Surgery;  Laterality: N/A;   ESOPHAGOGASTRODUODENOSCOPY  2006   ESOPHAGOGASTRODUODENOSCOPY  05/2020   no esophageal abnormality to explain dysphagia - esophagus dilated. Erosive gastropathy - neg H pylori ([place]  KNEE SURGERY  2006   left, torn meniscus   RIGHT/LEFT HEART CATH AND CORONARY ANGIOGRAPHY N/A 07/13/2020   Procedure: RIGHT/LEFT HEART CATH AND CORONARY ANGIOGRAPHY;  Surgeon: EnNelva BushMD;  [facility]: [facility];  Service: Cardiovascular;  Laterality: N/A;   TEE WITHOUT CARDIOVERSION N/A 08/03/2020   Procedure: TRANSESOPHAGEAL ECHOCARDIOGRAM (TEE);  Surgeon: BaGaye PollackMD;  Location: [facility] Service: Open Heart Surgery;  Laterality: N/A;   TOTAL ABDOMINAL HYSTERECTOMY  2004   fibroids, cervix remained   TOTAL KNEE ARTHROPLASTY Left 08/2012   [facility]    Allergies  Allergies  Allergen Reactions   Crestor [Rosuvastatin] Other (See Comments)  myalgias   Lipitor [Atorvastatin] Other (See Comments)    Mood swings   Lithium Other (See Comments)    abd pain, n/v, was hospitalized   Quetiapine Other (See Comments)    Muscle twitching, muscle spasm    History of Present Illness    71 year old female with the above complex past medical history including CAD, hypertension, hyperlipidemia, diabetes, heart failure with midrange ejection fraction, ischemic cardiomyopathy, remote tobacco abuse, COPD, sleep apnea, obesity, and GERD.  In the setting of generalized  weakness and progressive dyspnea on exertion, she was seen by pulmonology in the fall 2021.  Echo was performed and showed an EF of 40%, though imaging was overall suboptimal.  She was referred to cardiology with a repeat limited echo again showing suboptimal imaging.  Stress testing was undertaken and showed an EF of 45% with an area of apical inferior and apical scar versus ischemia.  Diagnostic catheterization November 2021, showed a 70% ostial LAD stenosis along with a 90% mid LAD stenosis.  She otherwise had nonobstructive disease.  She was referred to cardiothoracic surgery and subsequently underwent CABG x1 with a LIMA to the LAD on August 03, 2020.  Of note, EF by intraoperative TEE was 50%.  Postoperative course notable for anemia, though she did not require transfusion.  In March 2022, she was evaluated in the emergency department with chest pain and was placed on isosorbide mononitrate.  Follow-up stress testing showed a moderate, fixed apical inferior and apical defect consistent with scar, without ischemia.  EF was 30-44%.  Imdur was titrated to 30 mg daily and later titrated to 60 mg daily in the setting of ongoing intermittent chest discomfort however, this was subsequently down-titrated to 30 mg daily due to soft blood pressures.  In July 2022, she was placed on torsemide therapy in the setting of volume overload.  Ms. Hagenow was doing well at January 20, 2022 cardiology visit however, on July 6, she developed severe, somewhat focal left-sided chest pain associated with dyspnea and diaphoresis while sitting in the waiting room for [facility] (husband having a colonoscopy).  She was taken to the emergency department where ECG was unremarkable and troponins were normal.  Chest x-ray showed no acute abnormalities and aortic atherosclerosis.  Symptoms resolved over about 45 minutes and she was discharged home on Imdur 30 mg twice daily.  At office follow-up on July 11, she denied any recurrent symptoms, but  did note a long history of somewhat focal left-sided chest discomfort dating back to her surgery in 2021.  Blood pressure was soft at 90/52 and her torsemide dose was reduced to 10 mg daily.  She was set up for a [employer] which was carried out earlier this month and was noted for significant degradation of imaging due to breast attenuation, obesity, and extracardiac activity, without evidence of scar or ischemia.  EF however was reduced at 30 to 35% in the setting of poor imaging and thus the study was read as intermediate risk.  A follow-up echocardiogram was performed which showed low-normal LV function with an EF of 50 to 55% without regional wall motion abnormalities.  Since her last visit, she has continued to have intermittent focal, sharp left-sided chest pain, similar to what she has been experiencing since her bypass surgery, typically occurring when she gets up to use the commode, lasting a few seconds, and resolving with rest.  There has been no real change in her symptoms for several years.  She again notes chronic dyspnea  on exertion in the setting of being sedentary.  She does wear oxygen at night and notes that sometimes she even puts it on during the day.  She is followed by pulmonology.  She denies palpitations, PND, orthopnea, dizziness, syncope, edema, or early satiety  Home Medications    Current Outpatient Medications  Medication Sig Dispense Refill   acetaminophen (TYLENOL) 650 MG CR tablet Take 1,300 mg by mouth 2 (two) times daily.     albuterol (VENTOLIN HFA) 108 (90 Base) MCG/ACT inhaler Inhale 2 puffs into the lungs every 6 (six) hours as needed for wheezing or shortness of breath. 18 g 3   Apple Cider Vinegar 600 MG CAPS      aspirin 81 MG EC tablet Take 1 tablet (81 mg total) by mouth daily.     aspirin-acetaminophen-caffeine (EXCEDRIN MIGRAINE) 250-250-65 MG tablet Take by mouth every 6 (six) hours as needed for headache.     Boswellia-Glucosamine-Vit D (OSTEO BI-FLEX  ONE PER DAY PO) Take 1 tablet by mouth daily.     Budeson-Glycopyrrol-Formoterol (BREZTRI AEROSPHERE) 160-9-4.8 MCG/ACT AERO Inhale 2 puffs into the lungs in the morning and at bedtime. 10.7 g 11   cholecalciferol (VITAMIN D3) 25 MCG (1000 UNIT) tablet Take 1,000 Units by mouth daily.     cyclobenzaprine (FLEXERIL) 10 MG tablet TAKE 1/2 TO 1 TABLET BY MOUTH TWICE A DAY AS NEEDED FOR MUSCLE SPASMS AND MIGRAINE 30 tablet 3   famotidine (PEPCID) 20 MG tablet Take 20 mg by mouth every other day. Alternates with omeprazole     gabapentin (NEURONTIN) 100 MG capsule Take 1 capsule (100 mg total) by mouth 2 (two) times daily.     isosorbide mononitrate (IMDUR) 60 MG 24 hr tablet Take 1 tablet (60 mg total) by mouth daily. (Patient taking differently: Take 30 mg by mouth daily. 30 mg in the am and 30 in the pm) 90 tablet 1   levothyroxine (SYNTHROID) 100 MCG tablet TAKE 1 TABLET DAILY, AND ON2 DAYS A WEEK, TAKE AN     EXTRA 1/2 TABLET 90 tablet 2   loratadine (CLARITIN) 10 MG tablet Take 1 tablet (10 mg total) by mouth daily.     Melatonin 5 MG CAPS Take by mouth.     metFORMIN (GLUCOPHAGE) 500 MG tablet TAKE 2 TABLETS TWO TIMES A DAY WITH MEALS 360 tablet 0   metoprolol succinate (TOPROL-XL) 25 MG 24 hr tablet TAKE 1 TABLET (25 MG TOTAL) BY MOUTH DAILY. (Patient taking differently: Take 25 mg by mouth at bedtime.) 90 tablet 1   Multiple Vitamin (MULTIVITAMIN WITH MINERALS) TABS tablet Take 1 tablet by mouth daily. One A Day for Women     mupirocin ointment (BACTROBAN) 2 % Apply 1 Application topically 2 (two) times daily. 15 g 0   naproxen sodium (ALEVE) 220 MG tablet Take 220 mg by mouth 2 (two) times daily with a meal.     nystatin (MYCOSTATIN) 100000 UNIT/ML suspension Take 5 mLs (500,000 Units total) by mouth 3 (three) times daily. 120 mL 0   nystatin cream (MYCOSTATIN) APPLY TO AFFECTED AREA TWICE A DAY 30 g 1   omeprazole (PRILOSEC) 20 MG capsule Take 1 capsule (20 mg total) by mouth daily. Take 1  daily-Alternates with famotidine 90 capsule 3   ONE TOUCH LANCETS MISC Use to check sugar twice daily Dx: E11.8 200 each 3   ONETOUCH ULTRA test strip USE TO CHECK BLOOD SUGAR 2 TIMES   DAILY 200 strip 3   OXYGEN Inhale  2 L into the lungs at bedtime.     Rhubarb (ESTROVEN COMPLETE PO) Take 1 tablet by mouth daily.     simvastatin (ZOCOR) 40 MG tablet TAKE 1 TABLET AT BEDTIME 90 tablet 0   torsemide (DEMADEX) 20 MG tablet Take 0.5 tablets (10 mg total) by mouth as directed. Take one half tablet (10 mg) once a day and may take additional one half tablet (10 mg) as needed for weight gain or shortness of breath. 90 tablet 3   traMADol (ULTRAM) 50 MG tablet TAKE 1 TABLET (50 MG TOTAL) BY MOUTH TWICE A DAY AS NEEDED FOR MODERATE PAIN 30 tablet 0   vitamin B-12 (CYANOCOBALAMIN) 1000 MCG tablet Take 1,000 mcg by mouth daily.     ziprasidone (GEODON) 40 MG capsule TAKE 1 CAPSULE EVERY       MORNING AND AT BEDTIME 180 capsule 1   Semaglutide,0.25 or 0.'5MG'$ /DOS, (OZEMPIC, 0.25 OR 0.5 MG/DOSE,) 2 MG/1.5ML SOPN as directed.     No current facility-administered medications for this visit.     Review of Systems    Chronic focal and sharp left-sided chest discomfort.  Chronic dyspnea on exertion.  She denies palpitations, PND, orthopnea, dizziness, syncope, edema, or early satiety.  All other systems reviewed and are otherwise negative except as noted above.    Physical Exam    VS:  BP (!) 122/50 (BP Location: Left Arm, Patient Position: Sitting, Cuff Size: Normal)   Pulse 79   Ht '5\' 2"'$  (1.575 m)   Wt 226 lb 4 oz (102.6 kg)   SpO2 97%   BMI 41.38 kg/m  , BMI Body mass index is 41.38 kg/m.     GEN: Obese, in no acute distress. HEENT: normal. Neck: Supple, no JVD, carotid bruits, or masses. Cardiac: Predominantly regular with occasional ectopy.  No murmurs, rubs, or gallops. No clubbing, cyanosis, edema.  Radials/PT 2+ and equal bilaterally.  Respiratory:  Respirations regular and unlabored, clear to  auscultation bilaterally. GI: Obese, soft, nontender, nondistended, BS + x 4. MS: no deformity or atrophy. Skin: warm and dry, no rash. Neuro:  Strength and sensation are intact. Psych: Normal affect.  Accessory Clinical Findings    ECG personally reviewed by me today -regular sinus rhythm, 96, frequent PACs, anterior infarct- no acute changes.  Lab Results  Component Value Date   WBC 16.4 (H) 02/23/2022   HGB 11.5 (L) 02/23/2022   HCT 36.5 02/23/2022   MCV 84.9 02/23/2022   PLT 433 (H) 02/23/2022   Lab Results  Component Value Date   CREATININE 0.70 02/23/2022   BUN 13 02/23/2022   NA 139 02/23/2022   K 4.1 02/23/2022   CL 104 02/23/2022   CO2 24 02/23/2022   Lab Results  Component Value Date   ALT 13 11/11/2021   AST 14 11/11/2021   ALKPHOS 51 11/11/2021   BILITOT 0.2 11/11/2021   Lab Results  Component Value Date   CHOL 126 11/11/2021   HDL 38.50 (L) 11/11/2021   LDLCALC 48 12/08/2013   LDLDIRECT 53.0 11/11/2021   TRIG 297.0 (H) 11/11/2021   CHOLHDL 3 11/11/2021    Lab Results  Component Value Date   HGBA1C 6.2 (A) 02/13/2022    Assessment & Plan    1.  Coronary artery disease/chronic chest pain: Status post CABG x1 with LIMA to the LAD December 2021.  She subsequently underwent stress testing March 2022 in the setting of chest pain, which was nonischemic.  She has chronic but stable dyspnea  on exertion and also chronic, sharp and focal left chest discomfort.  She recently underwent stress testing following a more profound episode of chest discomfort, which showed no evidence of ischemia or infarct with overall poor imaging and suggestion of reduced EF at 30 to 35%.  This was followed by echocardiogram which showed EF of 50 to 55% without regional wall motion abnormalities.  Patient symptoms have been stable dating back to her bypass surgery in 2021 and in light of recent stress testing and echo, we agreed to continue medical therapy including beta-blocker,  aspirin, statin, and nitrate.  We will reserve additional testing such as PET/CT or cath for progressive symptoms.  2.  Chronic heart failure with midrange ejection fraction/ischemic cardiomyopathy: Euvolemic on examination.  Recent echo with an EF of 50 to 55%.  GDMT limited by chronically soft blood pressures.  She remains on metoprolol succinate and is taking torsemide 10 mg daily with an additional dose in the afternoon as needed.  She think she may actually be taking torsemide 10 mg twice daily but is not sure if she is putting half a tablet in her pillbox or not.  She will check on this and try and restrict to just 1 tablet a day unless an additional tablet is necessary in the afternoon.  SGLT2i cost-prohibitive.  3.  Essential hypertension: Stable.  4.  Hyperlipidemia: LDL 53 earlier this year with normal LFTs.  Continue simvastatin therapy.  5.  Type 2 diabetes mellitus: A1c 6.2 in June.  She is on metformin and ozempic and followed by primary care.  6.  Disposition: Follow-up in clinic in 3 months or sooner if necessary.   Murray Hodgkins, NP 04/05/2022, 2:40 PM

## 2022-04-06 DIAGNOSIS — J449 Chronic obstructive pulmonary disease, unspecified: Secondary | ICD-10-CM | POA: Diagnosis not present

## 2022-04-07 DIAGNOSIS — M1711 Unilateral primary osteoarthritis, right knee: Secondary | ICD-10-CM | POA: Diagnosis not present

## 2022-04-14 ENCOUNTER — Telehealth: Payer: Self-pay

## 2022-04-14 NOTE — Progress Notes (Signed)
Chronic Care Management Pharmacy Assistant   Name: [name]  MRN: [id number] DOB: 1950-12-24  Reason for Encounter: CCM (Appointment Reminder)  Medications: Outpatient Encounter Medications as of 04/14/2022  Medication Sig   acetaminophen (TYLENOL) 650 MG CR tablet Take 1,300 mg by mouth 2 (two) times daily.   albuterol (VENTOLIN HFA) 108 (90 Base) MCG/ACT inhaler Inhale 2 puffs into the lungs every 6 (six) hours as needed for wheezing or shortness of breath.   Apple Cider Vinegar 600 MG CAPS    aspirin 81 MG EC tablet Take 1 tablet (81 mg total) by mouth daily.   aspirin-acetaminophen-caffeine (EXCEDRIN MIGRAINE) 250-250-65 MG tablet Take by mouth every 6 (six) hours as needed for headache.   Boswellia-Glucosamine-Vit D (OSTEO BI-FLEX ONE PER DAY PO) Take 1 tablet by mouth daily.   Budeson-Glycopyrrol-Formoterol (BREZTRI AEROSPHERE) 160-9-4.8 MCG/ACT AERO Inhale 2 puffs into the lungs in the morning and at bedtime.   cholecalciferol (VITAMIN D3) 25 MCG (1000 UNIT) tablet Take 1,000 Units by mouth daily.   cyclobenzaprine (FLEXERIL) 10 MG tablet TAKE 1/2 TO 1 TABLET BY MOUTH TWICE A DAY AS NEEDED FOR MUSCLE SPASMS AND MIGRAINE   famotidine (PEPCID) 20 MG tablet Take 20 mg by mouth every other day. Alternates with omeprazole   gabapentin (NEURONTIN) 100 MG capsule Take 1 capsule (100 mg total) by mouth 2 (two) times daily.   isosorbide mononitrate (IMDUR) 60 MG 24 hr tablet Take 1 tablet (60 mg total) by mouth daily. (Patient taking differently: Take 30 mg by mouth daily. 30 mg in the am and 30 in the pm)   levothyroxine (SYNTHROID) 100 MCG tablet TAKE 1 TABLET DAILY, AND ON2 DAYS A WEEK, TAKE AN     EXTRA 1/2 TABLET   loratadine (CLARITIN) 10 MG tablet Take 1 tablet (10 mg total) by mouth daily.   Melatonin 5 MG CAPS Take by mouth.   metFORMIN (GLUCOPHAGE) 500 MG tablet TAKE 2 TABLETS TWO TIMES A DAY WITH MEALS   metoprolol succinate (TOPROL-XL) 25 MG 24 hr tablet TAKE 1 TABLET  (25 MG TOTAL) BY MOUTH DAILY. (Patient taking differently: Take 25 mg by mouth at bedtime.)   Multiple Vitamin (MULTIVITAMIN WITH MINERALS) TABS tablet Take 1 tablet by mouth daily. One A Day for Women   mupirocin ointment (BACTROBAN) 2 % Apply 1 Application topically 2 (two) times daily.   naproxen sodium (ALEVE) 220 MG tablet Take 220 mg by mouth 2 (two) times daily with a meal.   nystatin (MYCOSTATIN) 100000 UNIT/ML suspension Take 5 mLs (500,000 Units total) by mouth 3 (three) times daily.   nystatin cream (MYCOSTATIN) APPLY TO AFFECTED AREA TWICE A DAY   omeprazole (PRILOSEC) 20 MG capsule Take 1 capsule (20 mg total) by mouth daily. Take 1 daily-Alternates with famotidine   ONE TOUCH LANCETS MISC Use to check sugar twice daily Dx: E11.8   ONETOUCH ULTRA test strip USE TO CHECK BLOOD SUGAR 2 TIMES   DAILY   OXYGEN Inhale 2 L into the lungs at bedtime.   Rhubarb (ESTROVEN COMPLETE PO) Take 1 tablet by mouth daily.   Semaglutide,0.25 or 0.'5MG'$ /DOS, (OZEMPIC, 0.25 OR 0.5 MG/DOSE,) 2 MG/1.5ML SOPN as directed.   simvastatin (ZOCOR) 40 MG tablet TAKE 1 TABLET AT BEDTIME   torsemide (DEMADEX) 20 MG tablet Take 0.5 tablets (10 mg total) by mouth as directed. Take one half tablet (10 mg) once a day and may take additional one half tablet (10 mg) as needed for weight gain or  shortness of breath.   traMADol (ULTRAM) 50 MG tablet TAKE 1 TABLET (50 MG TOTAL) BY MOUTH TWICE A DAY AS NEEDED FOR MODERATE PAIN   vitamin B-12 (CYANOCOBALAMIN) 1000 MCG tablet Take 1,000 mcg by mouth daily.   ziprasidone (GEODON) 40 MG capsule TAKE 1 CAPSULE EVERY       MORNING AND AT BEDTIME   No facility-administered encounter medications on file as of 04/14/2022.   [name] was contacted to remind of upcoming telephone visit with Charlene Brooke on 04/19/22 at 10:00. Patient was reminded to have any blood glucose and blood pressure readings available for review at appointment.   Message was left reminding patient  of appointment.  CCM referral has been placed prior to visit?  Yes   Star Rating Drugs: Medication:  Last Fill: Day Supply Metformin 500 mg       12/22/21          90 Ozempic 0.25 mg        PAP Simvastatin 40 mg      12/19/21          90 Fill dates verified with [facility], CPP notified   Marijean Niemann, [place] Pharmacy Assistant [phone]

## 2022-04-19 ENCOUNTER — Ambulatory Visit (INDEPENDENT_AMBULATORY_CARE_PROVIDER_SITE_OTHER): Payer: Medicare HMO | Admitting: Pharmacist

## 2022-04-19 DIAGNOSIS — E118 Type 2 diabetes mellitus with unspecified complications: Secondary | ICD-10-CM

## 2022-04-19 DIAGNOSIS — J449 Chronic obstructive pulmonary disease, unspecified: Secondary | ICD-10-CM

## 2022-04-19 DIAGNOSIS — I25118 Atherosclerotic heart disease of native coronary artery with other forms of angina pectoris: Secondary | ICD-10-CM

## 2022-04-19 DIAGNOSIS — E785 Hyperlipidemia, unspecified: Secondary | ICD-10-CM

## 2022-04-19 DIAGNOSIS — I10 Essential (primary) hypertension: Secondary | ICD-10-CM

## 2022-04-19 MED ORDER — SIMVASTATIN 40 MG PO TABS: 40.0000 mg | ORAL_TABLET | Freq: Every day | ORAL | 0 refills | Status: DC

## 2022-04-19 NOTE — Patient Instructions (Signed)
Visit Information  Phone number for Pharmacist: [phone]   Goals Addressed   None     Care Plan : [facility]  Updates made by Charlton Haws, [facility] since 04/19/2022 12:00 AM     Problem: Hypertension, Hyperlipidemia, Diabetes, Coronary Artery Disease, COPD, Osteoarthritis, and Bipolar disorder   Priority: High     Long-Range Goal: Disease management   Start Date: 05/11/2021  Expected End Date: 12/21/2022  This Visit's Progress: On track  Recent Progress: On track  Priority: High  Note:   Current Barriers:  None identified  Pharmacist Clinical Goal(s):  Patient will contact provider office for questions/concerns as evidenced notation of same in electronic health record through collaboration with PharmD and provider.   Interventions: 1:1 collaboration with Ria Bush, MD regarding development and update of comprehensive plan of care as evidenced by provider attestation and co-signature Inter-disciplinary care team collaboration (see longitudinal plan of care) Comprehensive medication review performed; medication list updated in electronic medical record  Hypertension / Heart Failure (BP goal <130/80) -Controlled - BP is near goal; pt reports compliance with torsemide as below, she has not needed extra PM dose for excess swelling/SOB in the past several weeks -pt reports marked improvement in chest pain since starting metoprolol;  -ECHO 05/2020 - LVEF 40% and Grade 1 diastolic dysfunction (prior to CABG) -Denies hypotensive/hypertensive symptoms -Current home readings: 146/75, 138/78, 135/75, 144/72 -Current treatment: Isosorbide MN 60 mg -1/2 tab BID - Appropriate, Effective, Safe, Accessible Torsemide 20 mg -1/2 tab daily - Appropriate, Effective, Safe, Accessible Metoprolol succinate 25 mg daily PM - Appropriate, Effective, Safe, Accessible -Educated on BP goals and benefits of medications for prevention of heart attack, stroke and kidney damage;  Daily salt intake goal < 2300 mg; -Counseled that torsemide was added by cardiologist to help with SOB -Counseled to monitor BP at home daily -Recommended to continue current medication  Hyperlipidemia: (LDL goal < 70) -Controlled - LDL is at goal; pt endorses compliance with statin and denies issues -Hx CAD, CABG 2012, 07/2020; [place] 11/09/2020 with no significant ischemia, fixed apical defect. -Current treatment: Simvastatin 40 mg daily - Appropriate, Effective, Safe, Accessible Aspirin 81 mg daily -Appropriate, Effective, Safe, Accessible -Educated on Cholesterol goals; Benefits of statin for ASCVD risk reduction; -Recommended to continue current medication  Diabetes (A1c goal <7%) -Controlled - A1c 6.2% (01/2022) at goal;  pt endorses compliance with metformin; she has completed 4 weeks of Ozempic 0.25 mg and denies side effects; she reports she has lost about 1 lb since starting Ozempic. -Current home glucose readings fasting glucose: 134, 102, 115, 104, 107 Post-prandial glucose: 148, 147, 122, 173 -Denies hypoglycemic/hyperglycemic symptoms -Current medications: Metformin 500 mg -2 tab BID -Appropriate, Effective, Safe, Accessible Ozempic 0.25 mg weekly - Appropriate, Effective, Safe, Accessible Testing supplies -Medications previously tried: Novolin N, glimepiride, pioglitazone -Current exercise: step exercises - 4000 steps/day (limited d/t knees, SOB) -Recommend to increase Ozempic to 0.5 mg weekly (therapeutic dose)  COPD (Goal: control symptoms and prevent exacerbations) -Controlled - Pt notes improvement since taking Breztri now that she gets it free through PAP; she did develop thrush recently and was treated with nystatin rinse, initially thrush improved but it does keep returning -Gold Grade: Gold 1 (FEV1>80%) -Current COPD Classification:  A (low sx, <2 exacerbations/yr) -MMRC/CAT score: not on file -Pulmonary function testing: 04/20/20 - FEV1 84% predicted,  FEV1/FVC 0.76 -Exacerbations requiring treatment in last 6 months: 0 -Current treatment  Breztri 160-9-4.8 mcg/act 2 puffs BID (PAP) -Appropriate,  Effective, Safe, Accessible Albuterol HFA prn - Appropriate, Effective, Safe, Accessible Oxygen 2L HS -Medications previously tried: Trelegy ($$), Anoro, Symbciort  -Patient denies consistent use of maintenance inhaler -Frequency of rescue inhaler use: every other day -Counseled pt is enrolled in [employer] through 08/20/2022, at end of 2023 we can re-apply for following year -Counseled Breztri contained ICS which can cause thrush; advised to rinse mouth with mouthwash following every use of Breztri -Recommend to continue current medication  Bipolar disorder (Goal: manage symptoms) -Controlled - Pt reports "ziprasidone is a life saver"; she never wants to change this med -Current treatment: Ziprasidone 40 mg BID -Appropriate, Effective, Safe, Accessible -Medications previously tried/failed: quetiapine, diazepam -Connected with PCP for mental health support -Recommended to continue current medication  Migraines (Goal: prevent episodes) -Not ideally controlled - pt stopped Ajovy months ago because it was not helping and it was very expensive -Pt has appt 07/19/21 but had to cancel due to husband's health issues -Current treatment  Cyclobenzaprine 10 mg PRN -Appropriate, Query Effective Excedrin PRN (APAP/caffeine/ASA) - Appropriate, Query Effective -Medications previously tried: Nurtec, topamax, Ajovy -Discussed that there are other medications available for migraine prevention that can be tried; pt to discuss her concerns with her neurologist  Pain (Goal: manage symptoms) -Not ideally controlled - pt reports gel injections in her knee were not helpful; she is meant to follow up with ortho for next steps but will have a hard time getting to any appts in immediate future due to husband's poor health -osteoarthritis of knee, cervical pain,  fibromyalgia -Current treatment  Cyclobenzaprine 10 mg PRN -Appropriate, Query Effective Gabapentin 100 mg 1-3 cap HS -Appropriate, Query Effective Naproxen - 1 tab BID - Appropriate, Effective,  Query Safe Tylenol 650 mg - 2 tab BID -Appropriate, Query Effective Tramadol 50 mg PRN -Appropriate, Query Effective -Counseled on benefits/risks of increasing gabapentin, advised it is safe to try higher dose -Counseled on risks of chronic NSAID use -  advised her to use the lowest effective dose of NSAID to control her pain in order to reduce CV and kidney risks  GERD (Goal: minimize symptoms of reflux ) -Controlled - per pt report -Current treatment  Omeprazole 20 mg QOD - Appropriate, Effective, Safe, Accessible Famotidine 20 mg QOD - Appropriate, Effective, Safe, Accessible -Medications previously tried: none reported  -Hx of Bleeds/ulcers: No -Counseled on small meals, elevating head, and sleeping on left side -Recommended to continue current medication  Patient Goals/Self-Care Activities Patient will:  - take medications as prescribed focus on medication adherence by pill box -check glucose twice a day, document, and provide at future appointments -check blood pressure daily, document, and provide at future appointments      Patient verbalizes understanding of instructions and care plan provided today and agrees to view in [place]. Active MyChart status and patient understanding of how to access instructions and care plan via MyChart confirmed with patient.    Telephone follow up appointment with pharmacy team member scheduled for: 6 months  Charlene Brooke, PharmD, [facility] Clinical Pharmacist [facility] [phone]

## 2022-04-19 NOTE — Progress Notes (Signed)
Chronic Care Management Pharmacy Note  04/19/2022 Name:  [name] MRN:  [id number] DOB:  [birth year]  Summary: CCM F/U visit -DM: A1c 6.2% (01/2022) on metformin only; she recently added Ozempic for weight loss and CV benefit, she has now completed 4 weeks of 0.25 mg dose and tolerated well, she is down about 1-2 lbs so far -HTN/HF: BP range 135/75 - 146/75 is reasonable, though not ideal < 130/80 (given DM, HF); she has history of low BP < 100/60 so would avoid increasing BP meds at this time -HLD/CAD: LDL 53 (10/2021) at goal; pt needs refill of simvastatin  Recommendations/Changes made from today's visit: -Advised to increase to Ozempic to 0.5 mg as originally prescribed -Refilled simvastatin to mail order via office refill protocol  Plan: -[place] will call patient 3 months for BP update -Pharmacist follow up televisit scheduled for 6 months -PCP 05/16/22    Subjective: [name] is an 71 y.o. year old female who is a primary patient of Ria Bush, MD.  The CCM team was consulted for assistance with disease management and care coordination needs.    Engaged with patient by telephone for follow up visit in response to provider referral for pharmacy case management and/or care coordination services.   Consent to Services:  The patient was given information about Chronic Care Management services, agreed to services, and gave verbal consent prior to initiation of services.  Please see initial visit note for detailed documentation.   Patient Care Team: Ria Bush, MD as PCP - General (Family Medicine) Kate Sable, MD as PCP - Cardiology (Cardiology) Bryson Ha, OD as Consulting Physician (Optometry) Charlton Haws, [facility] as Pharmacist (Pharmacist)  Patient is from [place], she has been in [facility] for over 20 years. Her daughter still lives in [place], 2 brothers in [place], 1 brother in [place], sister in [place]. Pt is on disability x 10 years. Her  husband is retired in his 64s. She is "not very mobile", gets out for doctors visits, grocery store. She uses walker to get around.   Recent office visits: 02/13/22 Dr Danise Mina OV: A1c 6.2%; did not pick up Ozempic due to cost. Try for PAP  11/11/21 Dr Danise Mina OV: AWV - order heart monitor. Try Toprol XL 25 mg daily. Take Nystatin for thrush. Schedule DM eye exam. Refer to dermatology for breast wound. F/U 3 months.  03/30/21 Danise Mina (PCP) - Controlled type 2 diabetes mellitus with complication. Start Gabapentin for paresthesias D/c Topiramate.   01/10/21 Danise Mina (PCP) - Intractable migraine with status migrainosus. No med changes. Given Dexamethasone/ketorolac/promethazine injection in clinic. Ordered Nurtec.   12/27/20 Danise Mina (PCP) - Controlled type 2 diabetes mellitus with complication. Stop Novolin N due to low A1c, hypoglycemia.  Recent consult visits: 04/05/22 NP Ignacia Bayley (Cardiology): f/u CAD. Recent ECHO EF 50-55%. GDMT limited by hypotension. SGLT2-I cost-prohibitive. No med changes.  02/28/22 NP Ignacia Bayley (Cardiology): ED f/u - reduce torsemide to 1/2 tab daily due to hypotension. Ordered stress test - resulted intermediate risk, rec ECHO.  01/20/22 Dr Garen Lah (Cardiology): f/u CAD. Start Ozempic (DM, obesity, CAD).  10/20/21 Dr Garen Lah (Cardiology): f/u CAD - stable angina. Increase Imdur to 60 mg.  06/23/21 Dr Patsey Berthold (pulmonary): f/u COPD; resume Breztri. SOB mostly related to obesity, deconditioning, diastolic dysfunction. No need for CPAP.  04/21/21 Agbor-Etang (Cardiology) - F/U CAD. Chest discomfort improved with higher dose of Imdur. No med changes.   03/07/21 Jaynee Eagles (Neurology) - Chronic migraine without aura, with  intractable migraine. Start Ajovy 225 mg.   7/03/28/21 Agbor-Etang (Cardiology) - F/U CAD. Start Torsemide. Increase Isosorbide to 30 mg.  02/24/21 Patsey Berthold (Pulmonary) - Stage 1 mild COPD by GOLD classification. Start Trelegy. D/c Novolin & Anoro  Ellipta. (Changed to [facility] 7/25 due to cost)  Hospital visits: Patient seen in ED 7/6 for chest pain. Isosorbide was increased to 30 mg BID. She saw cardiologist Dr Sharolyn Douglas 02/28/22. Torsemide was reduced to 1/2 tablet due to hypotension (90/52) and ordered stress test - scheduled for 7/25.   Objective:  Lab Results  Component Value Date   CREATININE 0.70 02/23/2022   BUN 13 02/23/2022   GFR 62.06 11/11/2021   GFRNONAA >60 02/23/2022   GFRAA 80 08/26/2020   NA 139 02/23/2022   K 4.1 02/23/2022   CALCIUM 9.7 02/23/2022   CO2 24 02/23/2022   GLUCOSE 155 (H) 02/23/2022    Lab Results  Component Value Date/Time   HGBA1C 6.2 (A) 02/13/2022 10:35 AM   HGBA1C 6.6 (H) 11/11/2021 10:37 AM   HGBA1C 6.0 (A) 03/30/2021 09:58 AM   HGBA1C 5.9 (H) 07/30/2020 12:00 PM   GFR 62.06 11/11/2021 10:37 AM   GFR 86.14 09/27/2020 11:17 AM   MICROALBUR 1.6 11/11/2021 10:37 AM   MICROALBUR 2.4 (H) 12/27/2020 11:54 AM    Last diabetic Eye exam:  Lab Results  Component Value Date/Time   HMDIABEYEEXA No Retinopathy 02/14/2022 12:00 AM    Last diabetic Foot exam:  Lab Results  Component Value Date/Time   HMDIABFOOTEX done 02/25/2013 12:00 AM     Lab Results  Component Value Date   CHOL 126 11/11/2021   HDL 38.50 (L) 11/11/2021   LDLCALC 48 12/08/2013   LDLDIRECT 53.0 11/11/2021   TRIG 297.0 (H) 11/11/2021   CHOLHDL 3 11/11/2021       Latest Ref Rng & Units 11/11/2021   10:37 AM 09/14/2020    1:15 PM 08/02/2020   11:30 AM  Hepatic Function  Total Protein 6.0 - 8.3 g/dL 7.1  7.3  7.3   Albumin 3.5 - 5.2 g/dL 4.5  4.1  3.8   AST 0 - 37 U/L _0 ALT 0 - 35 U/L _1 Alk Phosphatase 39 - 117 U/L 51  65  40   Total Bilirubin 0.2 - 1.2 mg/dL 0.2  0.3  0.6     Lab Results  Component Value Date/Time   TSH 1.86 11/11/2021 10:37 AM   TSH 1.37 09/14/2020 01:15 PM   FREET4 0.94 04/10/2017 09:53 AM   FREET4 0.79 03/30/2016 08:52 AM       Latest Ref Rng & Units 02/23/2022     8:08 AM 11/11/2021   10:37 AM 02/25/2021   10:36 AM  CBC  WBC 4.0 - 10.5 K/uL 16.4  12.1  11.7   Hemoglobin 12.0 - 15.0 g/dL 11.5  10.9  10.7   Hematocrit 36.0 - 46.0 % 36.5  34.2  33.1   Platelets 150 - 400 K/uL 433  424.0  427     No results found for: "VD25OH"  Clinical ASCVD: Yes  The ASCVD Risk score (Arnett DK, et al., 2019) failed to calculate for the following reasons:   The valid total cholesterol range is 130 to 320 mg/dL       11/11/2021   10:42 AM 11/23/2020    1:16 PM 10/11/2020    4:17 PM  Depression screen PHQ 2/9  Decreased Interest 0 0  1  Down, Depressed, Hopeless 0 0 0  PHQ - 2 Score 0 0 1  Altered sleeping 0 0 1  Tired, decreased energy 1 0 1  Change in appetite 0 0 2  Feeling bad or failure about yourself  0 0 0  Trouble concentrating 0 0 0  Moving slowly or fidgety/restless 0 0 0  Suicidal thoughts 0 0 0  PHQ-9 Score 1 0 5  Difficult doing work/chores  Not difficult at all Not difficult at all       11/11/2021   10:42 AM 05/26/2020   11:22 AM  GAD 7 : Generalized Anxiety Score  Nervous, Anxious, on Edge 0 0  Control/stop worrying 0 0  Worry too much - different things 0 0  Trouble relaxing 0 0  Restless 0 0  Easily annoyed or irritable 0 0  Afraid - awful might happen 0 0  Total GAD 7 Score 0 0     Social History   Tobacco Use  Smoking Status Former   Packs/day: 2.00   Years: 43.00   Total pack years: 86.00   Types: Cigarettes   Quit date: 05/17/2010   Years since quitting: 11.9  Smokeless Tobacco Never  Tobacco Comments   QUIT 2011   BP Readings from Last 3 Encounters:  04/05/22 (!) 122/50  02/28/22 (!) 90/52  02/23/22 126/72   Pulse Readings from Last 3 Encounters:  04/05/22 79  02/28/22 75  02/23/22 68   Wt Readings from Last 3 Encounters:  04/05/22 226 lb 4 oz (102.6 kg)  02/28/22 225 lb 9.6 oz (102.3 kg)  02/23/22 228 lb (103.4 kg)   BMI Readings from Last 3 Encounters:  04/05/22 41.38 kg/m  02/28/22 41.26 kg/m   02/23/22 41.70 kg/m    Assessment/Interventions: Review of patient past medical history, allergies, medications, health status, including review of consultants reports, laboratory and other test data, was performed as part of comprehensive evaluation and provision of chronic care management services.   SDOH:  (Social Determinants of Health) assessments and interventions performed: Yes   SDOH Screenings   Alcohol Screen: Low Risk  (11/23/2020)   Alcohol Screen    Last Alcohol Screening Score (AUDIT): 0  Depression (PHQ2-9): Low Risk  (11/11/2021)   Depression (PHQ2-9)    PHQ-2 Score: 1  Financial Resource Strain: Medium Risk (02/15/2022)   Overall Financial Resource Strain (CARDIA)    Difficulty of Paying Living Expenses: Somewhat hard  Food Insecurity: No Food Insecurity (11/23/2020)   Hunger Vital Sign    Worried About Running Out of Food in the Last Year: Never true    Ran Out of Food in the Last Year: Never true  Housing: Low Risk  (11/23/2020)   Housing    Last Housing Risk Score: 0  Physical Activity: Inactive (11/23/2020)   Exercise Vital Sign    Days of Exercise per Week: 0 days    Minutes of Exercise per Session: 0 min  Social Connections: Not on file  Stress: No Stress Concern Present (11/23/2020)   [place]    Feeling of Stress : Not at all  Tobacco Use: Medium Risk (04/05/2022)   Patient History    Smoking Tobacco Use: Former    Smokeless Tobacco Use: Never    Passive Exposure: Not on file  Transportation Needs: No Transportation Needs (11/23/2020)   PRAPARE - Hydrologist (Medical): No    Lack of Transportation (Non-Medical): No  CCM Care Plan  Allergies  Allergen Reactions   Crestor [Rosuvastatin] Other (See Comments)    myalgias   Lipitor [Atorvastatin] Other (See Comments)    Mood swings   Lithium Other (See Comments)    abd pain, n/v, was hospitalized    Quetiapine Other (See Comments)    Muscle twitching, muscle spasm    Medications Reviewed Today     Reviewed by Charlton Haws, [facility] (Pharmacist) on 04/19/22 at 1035  Med List Status: <None>   Medication Order Taking? Sig Documenting Provider Last Dose Status Informant  acetaminophen (TYLENOL) 650 MG CR tablet [id number] Yes Take 1,300 mg by mouth 2 (two) times daily. [provider] Taking Active   albuterol (VENTOLIN HFA) 108 (90 Base) MCG/ACT inhaler [id number] Yes Inhale 2 puffs into the lungs every 6 (six) hours as needed for wheezing or shortness of breath. Tyler Pita, MD Taking Active   Apple Cider Vinegar 600 MG CAPS [id number] Yes  [provider] Taking Active   aspirin 81 MG EC tablet [id number] Yes Take 1 tablet (81 mg total) by mouth daily. Kate Sable, MD Taking Active   aspirin-acetaminophen-caffeine [facility] MIGRAINE) [phone] MG tablet [id number] Yes Take by mouth every 6 (six) hours as needed for headache. [provider] Taking Active   Boswellia-Glucosamine-Vit D (OSTEO BI-FLEX ONE PER DAY PO) [id number] Yes Take 1 tablet by mouth daily. [provider] Taking Active   Budeson-Glycopyrrol-Formoterol (BREZTRI AEROSPHERE) 160-9-4.8 MCG/ACT AERO [id number] Yes Inhale 2 puffs into the lungs in the morning and at bedtime. Tyler Pita, MD Taking Active            Med Note Tyrone Sage Oct 20, 2021 11:22 AM)    cholecalciferol (VITAMIN D3) 25 MCG (1000 UNIT) tablet [id number] Yes Take 1,000 Units by mouth daily. [provider] Taking Active   cyclobenzaprine (FLEXERIL) 10 MG tablet [id number] Yes TAKE 1/2 TO 1 TABLET BY MOUTH TWICE A DAY AS NEEDED FOR MUSCLE SPASMS AND MIGRAINE Ria Bush, MD Taking Active   famotidine (PEPCID) 20 MG tablet [id number] Yes Take 20 mg by mouth every other day. Alternates with omeprazole [provider] Taking Active Self  gabapentin (NEURONTIN) 100 MG  capsule [id number] Yes Take 1 capsule (100 mg total) by mouth 2 (two) times daily. Ria Bush, MD Taking Active   isosorbide mononitrate (IMDUR) 60 MG 24 hr tablet [id number]  Take 1 tablet (60 mg total) by mouth daily.  Patient taking differently: Take 30 mg by mouth daily. 30 mg in the am and 30 in the pm   Kate Sable, MD  Expired 04/05/22 2359   levothyroxine (SYNTHROID) 100 MCG tablet [id number] Yes TAKE 1 TABLET DAILY, AND ON2 DAYS A WEEK, TAKE AN     EXTRA 1/2 TABLET Ria Bush, MD Taking Active   loratadine (CLARITIN) 10 MG tablet [id number] Yes Take 1 tablet (10 mg total) by mouth daily. Ria Bush, MD Taking Active Self  Melatonin 5 MG CAPS [id number] Yes Take by mouth. [provider] Taking Active   metFORMIN (GLUCOPHAGE) 500 MG tablet [id number] Yes TAKE 2 TABLETS TWO TIMES A DAY WITH MEALS Ria Bush, MD Taking Active   metoprolol succinate (TOPROL-XL) 25 MG 24 hr tablet [id number] Yes TAKE 1 TABLET (25 MG TOTAL) BY MOUTH DAILY.  Patient taking differently: Take 25 mg by mouth at bedtime.   Ria Bush, MD Taking Active   Multiple Vitamin (MULTIVITAMIN WITH MINERALS) TABS tablet [id number] Yes Take 1 tablet  by mouth daily. One A Day for Women [provider] Taking Active Self  mupirocin ointment (BACTROBAN) 2 % [id number] Yes Apply 1 Application topically 2 (two) times daily. Ria Bush, MD Taking Active   naproxen sodium (ALEVE) 220 MG tablet [id number] Yes Take 220 mg by mouth 2 (two) times daily with a meal. [provider] Taking Active   nystatin (MYCOSTATIN) 100000 UNIT/ML suspension [id number] Yes Take 5 mLs (500,000 Units total) by mouth 3 (three) times daily. Ria Bush, MD Taking Active   nystatin cream Royden Purl) [id number] Yes APPLY TO DeRidder Gutierrez, Javier, MD Taking Active   omeprazole (PRILOSEC) 20 MG capsule [id number] Yes Take 1 capsule (20 mg total) by mouth daily. Take 1  daily-Alternates with famotidine  Patient taking differently: Take 20 mg by mouth every other day. Alternates with famotidine   Ria Bush, MD Taking Active   ONE [facility] LANCETS [place] [id number] Yes Use to check sugar twice daily Dx: E11.8 Ria Bush, MD Taking Active Self  [facility] ULTRA test strip [id number] Yes USE TO CHECK BLOOD SUGAR 2 TIMES   DAILY Ria Bush, MD Taking Active   OXYGEN [id number] Yes Inhale 2 L into the lungs at bedtime. [provider] Taking Active Self  Rhubarb (ESTROVEN COMPLETE PO) [id number] Yes Take 1 tablet by mouth daily. [provider] Taking Active Self  Semaglutide,0.25 or 0.5MG /DOS, (OZEMPIC, 0.25 OR 0.5 MG/DOSE,) 2 MG/1.5ML SOPN [id number] Yes Inject 0.5 mg into the skin once a week. Via [employer] PAP [provider] Taking Active   simvastatin (ZOCOR) 40 MG tablet [id number] Yes Take 1 tablet (40 mg total) by mouth at bedtime. Ria Bush, MD Taking Active   torsemide [facility]) 20 MG tablet [id number] Yes Take 0.5 tablets (10 mg total) by mouth as directed. Take one half tablet (10 mg) once a day and may take additional one half tablet (10 mg) as needed for weight gain or shortness of breath. Theora Gianotti, NP Taking Active   traMADol (ULTRAM) 50 MG tablet [id number] Yes TAKE 1 TABLET (50 MG TOTAL) BY MOUTH TWICE A DAY AS NEEDED FOR MODERATE PAIN Ria Bush, MD Taking Active   vitamin B-12 (CYANOCOBALAMIN) 1000 MCG tablet [id number] Yes Take 1,000 mcg by mouth daily. [provider] Taking Active   ziprasidone (GEODON) 40 MG capsule [id number] Yes TAKE 1 CAPSULE EVERY       MORNING AND AT BEDTIME Ria Bush, MD Taking Active             Patient Active Problem List   Diagnosis Date Noted   Chest pain 02/23/2022   Postural dizziness with presyncope 02/13/2022   Oral thrush 11/13/2021   Lymphadenopathy of right cervical region 11/13/2021   Skin lesion of breast 11/13/2021    Unilateral occipital headache 01/10/2021   Abdominal aortic ectasia ([place]) 10/09/2020   Back pain 09/27/2020   Gross hematuria 09/14/2020   S/P CABG x 1 08/03/2020   Abnormal cardiovascular stress test    Aortic atherosclerosis ([place]) 04/15/2020   COPD (chronic obstructive pulmonary disease) ([place]) 04/15/2020   Pill dysphagia 04/05/2020   Cervical neck pain with evidence of disc disease 10/23/2019   Left hip pain 10/23/2019   Right knee pain 10/23/2019   Osteoarthritis 10/23/2019   Anemia 10/23/2019   Weight loss, unintentional 10/05/2019   Hepatic steatosis 05/03/2019   Thrombocytosis 04/24/2019   Abdominal pain 04/24/2019   Migraine 04/19/2018   Obesity, morbid, BMI 40.0-49.9 ([place]) 08/17/2017   Palpitations 01/08/2017  Mass of right side of neck 07/10/2016   Neutrophilia 04/30/2015   Advanced care planning/counseling discussion 01/22/2015   Health maintenance examination 01/22/2015   Medicare annual wellness visit, subsequent 12/15/2013   Tachycardia 09/13/2012   Osteoarthritis of left knee 12/11/2011   Candidal intertrigo 05/10/2011   Hypertension    Bipolar disorder ([place])    Fibromyalgia    Hyperlipidemia associated with type 2 diabetes mellitus ([place])    GERD (gastroesophageal reflux disease)    Hypothyroidism 04/05/2011   OSA (obstructive sleep apnea) 03/16/2011   CAD (coronary artery disease) 03/16/2011   Diabetes mellitus type 2, controlled, with complications ([place]) 94/58/5929   Dyspnea 01/03/2011    Immunization History  Administered Date(s) Administered   Fluad Quad(high Dose 65+) 04/24/2019, 05/26/2020   Influenza Split 05/09/2011, 06/11/2012   Influenza Whole 05/20/2010   Influenza, High Dose Seasonal PF 06/01/2021   Influenza,inj,Quad PF,6+ Mos 07/14/2013, 06/16/2014, 04/30/2015, 05/31/2016, 05/10/2017, 04/19/2018   Moderna Covid-19 Vaccine Bivalent Booster 62yr & up 06/24/2021   Moderna SARS-COV2 Booster Vaccination 06/21/2020, 12/31/2020   Moderna  Sars-Covid-2 Vaccination 10/18/2019, 11/15/2019   Pneumococcal Conjugate-13 11/14/2017, 04/19/2018   Pneumococcal Polysaccharide-23 04/18/2011, 06/01/2021   Td 04/18/2011   Zoster Recombinat (Shingrix) 11/14/2017, 01/02/2018   Zoster, Live 12/29/2013    Conditions to be addressed/monitored:  Hypertension, Hyperlipidemia, Diabetes, Coronary Artery Disease, COPD, Osteoarthritis, and Bipolar disorder  Care Plan : [place] Updates made by FCharlton Haws [place] 04/19/2022 12:00 AM     Problem: Hypertension, Hyperlipidemia, Diabetes, Coronary Artery Disease, COPD, Osteoarthritis, and Bipolar disorder   Priority: High     Long-Range Goal: Disease management   Start Date: 05/11/2021  Expected End Date: 12/21/2022  This Visit's Progress: On track  Recent Progress: On track  Priority: High  Note:   Current Barriers:  None identified  Pharmacist Clinical Goal(s):  Patient will contact provider office for questions/concerns as evidenced notation of same in electronic health record through collaboration with PharmD and provider.   Interventions: 1:1 collaboration with GRia Bush MD regarding development and update of comprehensive plan of care as evidenced by provider attestation and co-signature Inter-disciplinary care team collaboration (see longitudinal plan of care) Comprehensive medication review performed; medication list updated in electronic medical record  Hypertension / Heart Failure (BP goal <130/80) -Controlled - BP is near goal; pt reports compliance with torsemide as below, she has not needed extra PM dose for excess swelling/SOB in the past several weeks -pt reports marked improvement in chest pain since starting metoprolol;  -ECHO 05/2020 - LVEF 40% and Grade 1 diastolic dysfunction (prior to CABG) -Denies hypotensive/hypertensive symptoms -Current home readings: 146/75, 138/78, 135/75, 144/72 -Current treatment: Isosorbide MN 60 mg -1/2 tab  BID - Appropriate, Effective, Safe, Accessible Torsemide 20 mg -1/2 tab daily - Appropriate, Effective, Safe, Accessible Metoprolol succinate 25 mg daily PM - Appropriate, Effective, Safe, Accessible -Educated on BP goals and benefits of medications for prevention of heart attack, stroke and kidney damage; Daily salt intake goal < 2300 mg; -Counseled that torsemide was added by cardiologist to help with SOB -Counseled to monitor BP at home daily -Recommended to continue current medication  Hyperlipidemia: (LDL goal < 70) -Controlled - LDL is at goal; pt endorses compliance with statin and denies issues -Hx CAD, CABG 2012, 07/2020; [place] with no significant ischemia, fixed apical defect. -Current treatment: Simvastatin 40 mg daily - Appropriate, Effective, Safe, Accessible Aspirin 81 mg daily -Appropriate, Effective, Safe, Accessible -Educated on Cholesterol goals; Benefits  of statin for ASCVD risk reduction; -Recommended to continue current medication  Diabetes (A1c goal <7%) -Controlled - A1c 6.2% (01/2022) at goal;  pt endorses compliance with metformin; she has completed 4 weeks of Ozempic 0.25 mg and denies side effects; she reports she has lost about 1 lb since starting Ozempic. -Current home glucose readings fasting glucose: 134, 102, 115, 104, 107 Post-prandial glucose: 148, 147, 122, 173 -Denies hypoglycemic/hyperglycemic symptoms -Current medications: Metformin 500 mg -2 tab BID -Appropriate, Effective, Safe, Accessible Ozempic 0.25 mg weekly - Appropriate, Effective, Safe, Accessible Testing supplies -Medications previously tried: Novolin N, glimepiride, pioglitazone -Current exercise: step exercises - 4000 steps/day (limited d/t knees, SOB) -Recommend to increase Ozempic to 0.5 mg weekly (therapeutic dose)  COPD (Goal: control symptoms and prevent exacerbations) -Controlled - Pt notes improvement since taking Breztri now that she gets it free through  PAP; she did develop thrush recently and was treated with nystatin rinse, initially thrush improved but it does keep returning -Gold Grade: Gold 1 (FEV1>80%) -Current COPD Classification:  A (low sx, <2 exacerbations/yr) -MMRC/CAT score: not on file -Pulmonary function testing: 04/20/20 - FEV1 84% predicted, FEV1/FVC 0.76 -Exacerbations requiring treatment in last 6 months: 0 -Current treatment  Breztri 160-9-4.8 mcg/act 2 puffs BID (PAP) -Appropriate, Effective, Safe, Accessible Albuterol HFA prn - Appropriate, Effective, Safe, Accessible Oxygen 2L HS -Medications previously tried: Trelegy ($$), Anoro, Symbciort  -Patient denies consistent use of maintenance inhaler -Frequency of rescue inhaler use: every other day -Counseled pt is enrolled in [place] through 08/20/2022, at end of 2023 we can re-apply for following year -Counseled Breztri contained ICS which can cause thrush; advised to rinse mouth with mouthwash following every use of Breztri -Recommend to continue current medication  Bipolar disorder (Goal: manage symptoms) -Controlled - Pt reports "ziprasidone is a life saver"; she never wants to change this med -Current treatment: Ziprasidone 40 mg BID -Appropriate, Effective, Safe, Accessible -Medications previously tried/failed: quetiapine, diazepam -Connected with PCP for mental health support -Recommended to continue current medication  Migraines (Goal: prevent episodes) -Not ideally controlled - pt stopped Ajovy months ago because it was not helping and it was very expensive -Pt has appt 07/19/21 but had to cancel due to husband's health issues -Current treatment  Cyclobenzaprine 10 mg PRN -Appropriate, Query Effective Excedrin PRN (APAP/caffeine/ASA) - Appropriate, Query Effective -Medications previously tried: Nurtec, topamax, Ajovy -Discussed that there are other medications available for migraine prevention that can be tried; pt to discuss her concerns with her  neurologist  Pain (Goal: manage symptoms) -Not ideally controlled - pt reports gel injections in her knee were not helpful; she is meant to follow up with ortho for next steps but will have a hard time getting to any appts in immediate future due to husband's poor health -osteoarthritis of knee, cervical pain, fibromyalgia -Current treatment  Cyclobenzaprine 10 mg PRN -Appropriate, Query Effective Gabapentin 100 mg 1-3 cap HS -Appropriate, Query Effective Naproxen - 1 tab BID - Appropriate, Effective,  Query Safe Tylenol 650 mg - 2 tab BID -Appropriate, Query Effective Tramadol 50 mg PRN -Appropriate, Query Effective -Counseled on benefits/risks of increasing gabapentin, advised it is safe to try higher dose -Counseled on risks of chronic NSAID use -  advised her to use the lowest effective dose of NSAID to control her pain in order to reduce CV and kidney risks  GERD (Goal: minimize symptoms of reflux ) -Controlled - per pt report -Current treatment  Omeprazole 20 mg QOD - Appropriate, Effective, Safe, Accessible Famotidine 20  mg QOD - Appropriate, Effective, Safe, Accessible -Medications previously tried: none reported  -Hx of Bleeds/ulcers: No -Counseled on small meals, elevating head, and sleeping on left side -Recommended to continue current medication  Patient Goals/Self-Care Activities Patient will:  - take medications as prescribed focus on medication adherence by pill box -check glucose twice a day, document, and provide at future appointments -check blood pressure daily, document, and provide at future appointments     Medication Assistance:  Breztri - [place] through 08/20/22  Compliance/Adherence/Medication fill history: Care Gaps: Colonoscopy (due 01/09/22)  Star-Rating Drugs: Simvastatin - PDC 86%; LF 12/21/21, 10/17/21 x 90 ds Metformin - PDC 100%  Medication Access: Within the past 30 days, how often has patient missed a dose of medication? 0 Is a  pillbox or other method used to improve adherence? Yes  Factors that may affect medication adherence? financial need Are meds synced by current pharmacy? No  Are meds delivered by current pharmacy? Yes  Does patient experience delays in picking up medications due to transportation concerns? No   [facility] Reviewed: Is patient disadvantaged to use [facility]?: Yes  Current Rx insurance plan: A[employer]na [place] Name and location of Current pharmacy[facility]  [id number] [place] [address] [place]Phone: [phone]: [phone] [facility] [place] [place] [address]: [phone]: [phone] [facility] services reviewed with patient today?: No  Reason patient declined to change pharmacies: Disadvantaged due to insurance/mail order  Patient decided to: Continue current medication management strategy  Care Plan and Follow Up Patient Decision:  Patient agrees to Care Plan and Follow-up.  Plan: Telephone follow up appointment with care management team member scheduled for:  6 months  LCharlene Brooke PharmD, BTulsa Spine & Specialty HospitalClinical Pharmacist LUniversity Of Virginia Medical CenterPrimary Care [phone]

## 2022-04-20 DIAGNOSIS — E1169 Type 2 diabetes mellitus with other specified complication: Secondary | ICD-10-CM

## 2022-04-20 DIAGNOSIS — Z7985 Long-term (current) use of injectable non-insulin antidiabetic drugs: Secondary | ICD-10-CM | POA: Diagnosis not present

## 2022-04-20 DIAGNOSIS — R69 Illness, unspecified: Secondary | ICD-10-CM | POA: Diagnosis not present

## 2022-04-20 DIAGNOSIS — Z794 Long term (current) use of insulin: Secondary | ICD-10-CM

## 2022-04-20 DIAGNOSIS — F319 Bipolar disorder, unspecified: Secondary | ICD-10-CM

## 2022-04-20 DIAGNOSIS — E785 Hyperlipidemia, unspecified: Secondary | ICD-10-CM | POA: Diagnosis not present

## 2022-04-20 DIAGNOSIS — I10 Essential (primary) hypertension: Secondary | ICD-10-CM

## 2022-04-20 DIAGNOSIS — J449 Chronic obstructive pulmonary disease, unspecified: Secondary | ICD-10-CM | POA: Diagnosis not present

## 2022-04-25 ENCOUNTER — Other Ambulatory Visit: Payer: Self-pay | Admitting: Family Medicine

## 2022-04-26 NOTE — Telephone Encounter (Signed)
Gabapentin Last filled:  03/02/22, #200 Last OV:  02/13/22, f/u Next OV:  05/16/22, 3 mo f/u

## 2022-04-27 NOTE — Progress Notes (Signed)
[facility] Quality Team Note  Name: [name] Date of Birth: 14-Jul-1951 MRN: [id number] Date: 04/27/2022  [facility] Quality Team has reviewed this patient's chart, please see recommendations below:  [facility] Quality Other; Pt has open quality gap for COL, needs Colorectal Cancer Screening to close this. Please address at next visit. Quality Coordinator will help schedule pt or have kit sent to her home if pt is agreeable.

## 2022-05-07 DIAGNOSIS — J449 Chronic obstructive pulmonary disease, unspecified: Secondary | ICD-10-CM | POA: Diagnosis not present

## 2022-05-16 ENCOUNTER — Ambulatory Visit (INDEPENDENT_AMBULATORY_CARE_PROVIDER_SITE_OTHER): Payer: Medicare HMO | Admitting: Family Medicine

## 2022-05-16 ENCOUNTER — Encounter: Payer: Self-pay | Admitting: Family Medicine

## 2022-05-16 VITALS — BP 126/68 | HR 89 | Temp 97.7°F | Ht 62.0 in | Wt 219.5 lb

## 2022-05-16 DIAGNOSIS — Z951 Presence of aortocoronary bypass graft: Secondary | ICD-10-CM

## 2022-05-16 DIAGNOSIS — J449 Chronic obstructive pulmonary disease, unspecified: Secondary | ICD-10-CM | POA: Diagnosis not present

## 2022-05-16 DIAGNOSIS — Z23 Encounter for immunization: Secondary | ICD-10-CM

## 2022-05-16 DIAGNOSIS — I10 Essential (primary) hypertension: Secondary | ICD-10-CM | POA: Diagnosis not present

## 2022-05-16 DIAGNOSIS — I25118 Atherosclerotic heart disease of native coronary artery with other forms of angina pectoris: Secondary | ICD-10-CM | POA: Diagnosis not present

## 2022-05-16 DIAGNOSIS — E1169 Type 2 diabetes mellitus with other specified complication: Secondary | ICD-10-CM | POA: Diagnosis not present

## 2022-05-16 LAB — POCT GLYCOSYLATED HEMOGLOBIN (HGB A1C): Hemoglobin A1C: 6 % — AB (ref 4.0–5.6)

## 2022-05-16 MED ORDER — METFORMIN HCL 500 MG PO TABS: ORAL_TABLET | ORAL | 1 refills | Status: DC

## 2022-05-16 MED ORDER — TRAMADOL HCL 50 MG PO TABS: ORAL_TABLET | ORAL | 0 refills | Status: DC

## 2022-05-16 NOTE — Assessment & Plan Note (Signed)
Congratulated on weight loss to date with ozempic on board.

## 2022-05-16 NOTE — Patient Instructions (Addendum)
Flu shot today Congrats on sugar control to date!  Continue ozempic 0.'5mg'$  weekly, drop metformin dose to 500/'1000mg'$  daily.  Consider RSV vaccine and updated COVID shot at local pharmacy  Good to see you today Return as needed or in 3 mo f/u visit

## 2022-05-16 NOTE — Assessment & Plan Note (Signed)
Chronic, great control to date.  

## 2022-05-16 NOTE — Assessment & Plan Note (Signed)
Chronic, great control to date on metformin '1000mg'$  bid and ozempic 0.'5mg'$  weekly. Noted some low postprandial readings to 80-90s - will drop am metformin to '500mg'$  daily. If doing well, consider further ozempic titration next visit. RTC 3 mo DM f/u visit.

## 2022-05-16 NOTE — Progress Notes (Signed)
Patient ID: [name], female    DOB: 13-Jul-1951, 71 y.o.   MRN: [id number]  This visit was conducted in person.  BP 126/68   Pulse 89   Temp 97.7 F (36.5 C) (Temporal)   Ht '5\' 2"'$  (1.575 m)   Wt 219 lb 8 oz (99.6 kg)   SpO2 95%   BMI 40.15 kg/m    CC: 3 mo f/u visit  Subjective:   HPI: [name] is a 71 y.o. female presenting on 05/16/2022 for Follow-up (Here for 3 mo f/u.)   CAD - sees cardiology last 03/2022 - s/p 1v CABG with LIMIA to LAD 07/2020. Subsequent stress test 10/2020 and again 03/2022 - non-ischemic. H/o chronic stable dyspnea on exertion, known COPD and OSA, continues breztri daily and supplemental oxygen at night - also notes she occasionally uses supplemental O2 during the day.   BP log - 112-150s/60-80s Requests tramadol refill due to intermittent headaches.   DM - does regularly check sugars and brings log - fasting 89-115, dinner postprandial 96-200. Compliant with antihyperglycemic regimen which includes: metformin '1000mg'$  BID, ozempic 0.'5mg'$  weekly. 9 lb weight loss since starting med. No nausea, constipation. Occasional loose stools. Denies low sugars or hypoglycemic symptoms. Denies blurry vision. Chronic foot paresthesias. Last diabetic eye exam 01/2022. Glucometer brand: onetouch ultra. Last foot exam: 01/2022. DSME: 2012. Lab Results  Component Value Date   HGBA1C 6.0 (A) 05/16/2022   Diabetic Foot Exam - Simple   No data filed    Lab Results  Component Value Date   MICROALBUR 1.6 11/11/2021         Relevant past medical, surgical, family and social history reviewed and updated as indicated. Interim medical history since our last visit reviewed. Allergies and medications reviewed and updated. Outpatient Medications Prior to Visit  Medication Sig Dispense Refill   acetaminophen (TYLENOL 8 HOUR ARTHRITIS PAIN) 650 MG CR tablet Take 650 mg by mouth every 8 (eight) hours as needed for pain.     albuterol (VENTOLIN HFA) 108 (90 Base)  MCG/ACT inhaler Inhale 2 puffs into the lungs every 6 (six) hours as needed for wheezing or shortness of breath. 18 g 3   Apple Cider Vinegar 600 MG CAPS      aspirin 81 MG EC tablet Take 1 tablet (81 mg total) by mouth daily.     aspirin-acetaminophen-caffeine (EXCEDRIN MIGRAINE) 250-250-65 MG tablet Take by mouth every 6 (six) hours as needed for headache.     Boswellia-Glucosamine-Vit D (OSTEO BI-FLEX ONE PER DAY PO) Take 1 tablet by mouth daily.     Budeson-Glycopyrrol-Formoterol (BREZTRI AEROSPHERE) 160-9-4.8 MCG/ACT AERO Inhale 2 puffs into the lungs in the morning and at bedtime. 10.7 g 11   cholecalciferol (VITAMIN D3) 25 MCG (1000 UNIT) tablet Take 1,000 Units by mouth daily.     cyclobenzaprine (FLEXERIL) 10 MG tablet TAKE 1/2 TO 1 TABLET BY MOUTH TWICE A DAY AS NEEDED FOR MUSCLE SPASMS AND MIGRAINE 30 tablet 3   famotidine (PEPCID) 20 MG tablet Take 20 mg by mouth every other day. Alternates with omeprazole     gabapentin (NEURONTIN) 100 MG capsule TAKE 1 TO 3 CAPSULES AT    BEDTIME (Patient taking differently: Takes 1 tablet 2 times a day) 200 capsule 1   IBUPROFEN PO Take by mouth as needed.     isosorbide mononitrate (IMDUR) 60 MG 24 hr tablet Take 1 tablet (60 mg total) by mouth daily. (Patient taking differently: Take 30 mg by mouth  daily. 30 mg in the am and 30 in the pm) 90 tablet 1   levothyroxine (SYNTHROID) 100 MCG tablet TAKE 1 TABLET DAILY, AND ON2 DAYS A WEEK, TAKE AN     EXTRA 1/2 TABLET 90 tablet 2   loratadine (CLARITIN) 10 MG tablet Take 1 tablet (10 mg total) by mouth daily.     metoprolol succinate (TOPROL-XL) 25 MG 24 hr tablet TAKE 1 TABLET (25 MG TOTAL) BY MOUTH DAILY. (Patient taking differently: Take 25 mg by mouth at bedtime.) 90 tablet 1   Multiple Vitamin (MULTIVITAMIN WITH MINERALS) TABS tablet Take 1 tablet by mouth daily. One A Day for Women     mupirocin ointment (BACTROBAN) 2 % Apply 1 Application topically 2 (two) times daily. 15 g 0   nystatin  (MYCOSTATIN) 100000 UNIT/ML suspension Take 5 mLs (500,000 Units total) by mouth 3 (three) times daily. 120 mL 0   nystatin cream (MYCOSTATIN) APPLY TO AFFECTED AREA TWICE A DAY 30 g 1   omeprazole (PRILOSEC) 20 MG capsule Take 1 capsule (20 mg total) by mouth daily. Take 1 daily-Alternates with famotidine (Patient taking differently: Take 20 mg by mouth every other day. Alternates with famotidine) 90 capsule 3   ONE TOUCH LANCETS MISC Use to check sugar twice daily Dx: E11.8 200 each 3   ONETOUCH ULTRA test strip USE TO CHECK BLOOD SUGAR 2 TIMES   DAILY 200 strip 3   OXYGEN Inhale 2 L into the lungs at bedtime.     Rhubarb (ESTROVEN COMPLETE PO) Take 1 tablet by mouth daily.     Semaglutide,0.25 or 0.'5MG'$ /DOS, (OZEMPIC, 0.25 OR 0.5 MG/DOSE,) 2 MG/1.5ML SOPN Inject 0.5 mg into the skin once a week. Via Novo Cares PAP     simvastatin (ZOCOR) 40 MG tablet Take 1 tablet (40 mg total) by mouth at bedtime. 90 tablet 0   torsemide (DEMADEX) 20 MG tablet Take 0.5 tablets (10 mg total) by mouth as directed. Take one half tablet (10 mg) once a day and may take additional one half tablet (10 mg) as needed for weight gain or shortness of breath. 90 tablet 3   vitamin B-12 (CYANOCOBALAMIN) 1000 MCG tablet Take 1,000 mcg by mouth daily.     ziprasidone (GEODON) 40 MG capsule TAKE 1 CAPSULE EVERY       MORNING AND AT BEDTIME 180 capsule 1   metFORMIN (GLUCOPHAGE) 500 MG tablet TAKE 2 TABLETS TWO TIMES A DAY WITH MEALS 360 tablet 0   traMADol (ULTRAM) 50 MG tablet TAKE 1 TABLET (50 MG TOTAL) BY MOUTH TWICE A DAY AS NEEDED FOR MODERATE PAIN 30 tablet 0   acetaminophen (TYLENOL) 650 MG CR tablet Take 1,300 mg by mouth 2 (two) times daily.     Melatonin 5 MG CAPS Take by mouth.     naproxen sodium (ALEVE) 220 MG tablet Take 220 mg by mouth 2 (two) times daily with a meal.     No facility-administered medications prior to visit.     Per HPI unless specifically indicated in ROS section below Review of  Systems  Objective:  BP 126/68   Pulse 89   Temp 97.7 F (36.5 C) (Temporal)   Ht '5\' 2"'$  (1.575 m)   Wt 219 lb 8 oz (99.6 kg)   SpO2 95%   BMI 40.15 kg/m   Wt Readings from Last 3 Encounters:  05/16/22 219 lb 8 oz (99.6 kg)  04/05/22 226 lb 4 oz (102.6 kg)  02/28/22 225 lb 9.6 oz (  102.3 kg)      Physical Exam Vitals and nursing note reviewed.  Constitutional:      Appearance: Normal appearance. She is not ill-appearing.  Eyes:     Extraocular Movements: Extraocular movements intact.     Conjunctiva/sclera: Conjunctivae normal.     Pupils: Pupils are equal, round, and reactive to light.  Cardiovascular:     Rate and Rhythm: Normal rate and regular rhythm.     Pulses: Normal pulses.     Heart sounds: Normal heart sounds. No murmur heard. Pulmonary:     Effort: Pulmonary effort is normal. No respiratory distress.     Breath sounds: Normal breath sounds. No wheezing, rhonchi or rales.  Musculoskeletal:     Right lower leg: No edema.     Left lower leg: No edema.  Skin:    General: Skin is warm and dry.     Findings: No rash.  Neurological:     Mental Status: She is alert.  Psychiatric:        Mood and Affect: Mood normal.        Behavior: Behavior normal.       Results for orders placed or performed in visit on 05/16/22  POCT glycosylated hemoglobin (Hb A1C)  Result Value Ref Range   Hemoglobin A1C 6.0 (A) 4.0 - 5.6 %   HbA1c POC (<> result, manual entry)     HbA1c, POC (prediabetic range)     HbA1c, POC (controlled diabetic range)      Assessment & Plan:   Problem List Items Addressed This Visit     Type 2 diabetes mellitus with other specified complication ([place]) - Primary    Chronic, great control to date on metformin '1000mg'$  bid and ozempic 0.'5mg'$  weekly. Noted some low postprandial readings to 80-90s - will drop am metformin to '500mg'$  daily. If doing well, consider further ozempic titration next visit. RTC 3 mo DM f/u visit.       Relevant Medications    metFORMIN (GLUCOPHAGE) 500 MG tablet   Other Relevant Orders   POCT glycosylated hemoglobin (Hb A1C) (Completed)   CAD (coronary artery disease)    Recent stress test non-ischemic. Appreciate cardiology care.       Hypertension    Chronic, great control to date.       Obesity, morbid, BMI 40.0-49.9 ([place])    Congratulated on weight loss to date with ozempic on board.       Relevant Medications   metFORMIN (GLUCOPHAGE) 500 MG tablet   COPD (chronic obstructive pulmonary disease) ([place])    Has pulm f/u next month. Continues breztri regularly.      S/P CABG x 1   Other Visit Diagnoses     Need for influenza vaccination       Relevant Orders   Flu Vaccine QUAD High Dose(Fluad) (Completed)        Meds ordered this encounter  Medications   traMADol (ULTRAM) 50 MG tablet    Sig: TAKE 1 TABLET (50 MG TOTAL) BY MOUTH TWICE A DAY AS NEEDED FOR MODERATE PAIN    Dispense:  30 tablet    Refill:  0    Not to exceed 5 additional fills before 05/07/2022   metFORMIN (GLUCOPHAGE) 500 MG tablet    Sig: Take 1 tablet (500 mg total) by mouth daily with breakfast AND 2 tablets (1,000 mg total) every evening.    Dispense:  270 tablet    Refill:  1    Note new sig  Orders Placed This Encounter  Procedures   Flu Vaccine QUAD High Dose(Fluad)   POCT glycosylated hemoglobin (Hb A1C)     Patient Instructions  Flu shot today Congrats on sugar control to date!  Continue ozempic 0.'5mg'$  weekly, drop metformin dose to 500/'1000mg'$  daily.  Consider RSV vaccine and updated COVID shot at local pharmacy  Good to see you today Return as needed or in 3 mo f/u visit   Follow up plan: Return in about 3 months (around 08/15/2022) for follow up visit.  Ria Bush, MD

## 2022-05-16 NOTE — Assessment & Plan Note (Addendum)
Recent stress test non-ischemic. Appreciate cardiology care.

## 2022-05-16 NOTE — Assessment & Plan Note (Signed)
Has pulm f/u next month. Continues breztri regularly.

## 2022-05-22 ENCOUNTER — Ambulatory Visit: Payer: Medicare HMO | Admitting: Dermatology

## 2022-05-22 DIAGNOSIS — Z79899 Other long term (current) drug therapy: Secondary | ICD-10-CM | POA: Diagnosis not present

## 2022-05-22 DIAGNOSIS — L0292 Furuncle, unspecified: Secondary | ICD-10-CM

## 2022-05-22 DIAGNOSIS — L02423 Furuncle of right upper limb: Secondary | ICD-10-CM | POA: Diagnosis not present

## 2022-05-22 DIAGNOSIS — L309 Dermatitis, unspecified: Secondary | ICD-10-CM | POA: Diagnosis not present

## 2022-05-22 MED ORDER — MUPIROCIN 2 % EX OINT: 1.0000 | TOPICAL_OINTMENT | Freq: Two times a day (BID) | CUTANEOUS | 3 refills | Status: DC

## 2022-05-22 MED ORDER — MOMETASONE FUROATE 0.1 % EX CREA: 1.0000 | TOPICAL_CREAM | Freq: Every day | CUTANEOUS | 1 refills | Status: DC | PRN

## 2022-05-22 MED ORDER — DOXYCYCLINE HYCLATE 100 MG PO TABS: 100.0000 mg | ORAL_TABLET | Freq: Two times a day (BID) | ORAL | 0 refills | Status: AC

## 2022-05-22 NOTE — Patient Instructions (Addendum)
Mupirocin ointment twice daily to areas of left breast, right shoulder and right chin. Once those areas are resolved, start Mometasone cream qd to hands as needed for rash    Due to recent changes in healthcare laws, you may see results of your pathology and/or laboratory studies on MyChart before the doctors have had a chance to review them. We understand that in some cases there may be results that are confusing or concerning to you. Please understand that not all results are received at the same time and often the doctors may need to interpret multiple results in order to provide you with the best plan of care or course of treatment. Therefore, we ask that you please give [place] 2 business days to thoroughly review all your results before contacting the office for clarification. Should we see a critical lab result, you will be contacted sooner.   If You Need Anything After Your Visit  If you have any questions or concerns for your doctor, please call our main line at [phone] and press option 4 to reach your doctor's medical assistant. If no one answers, please leave a voicemail as directed and we will return your call as soon as possible. Messages left after 4 pm will be answered the following business day.   You may also send [place] a message via [place]. We typically respond to MyChart messages within 1-2 business days.  For prescription refills, please ask your pharmacy to contact our office. Our fax number is [phone].  If you have an urgent issue when the clinic is closed that cannot wait until the next business day, you can page your doctor at the number below.    Please note that while we do our best to be available for urgent issues outside of office hours, we are not available 24/7.   If you have an urgent issue and are unable to reach [place], you may choose to seek medical care at your doctor's office, retail clinic, urgent care center, or emergency room.  If you have a medical  emergency, please immediately call 911 or go to the emergency department.  Pager Numbers  - Dr. Nehemiah Massed: [phone]  - Dr. Laurence Ferrari: [phone]  - Dr. Nicole Kindred: [phone]  In the event of inclement weather, please call our main line at [phone] for an update on the status of any delays or closures.  Dermatology Medication Tips: Please keep the boxes that topical medications come in in order to help keep track of the instructions about where and how to use these. Pharmacies typically print the medication instructions only on the boxes and not directly on the medication tubes.   If your medication is too expensive, please contact our office at [phone] option 4 or send [place] a message through [place].   We are unable to tell what your co-pay for medications will be in advance as this is different depending on your insurance coverage. However, we may be able to find a substitute medication at lower cost or fill out paperwork to get insurance to cover a needed medication.   If a prior authorization is required to get your medication covered by your insurance company, please allow [place] 1-2 business days to complete this process.  Drug prices often vary depending on where the prescription is filled and some pharmacies may offer cheaper prices.  The website www.goodrx.com contains coupons for medications through different pharmacies. The prices here do not account for what the cost may be with help from insurance (it may  be cheaper with your insurance), but the website can give you the price if you did not use any insurance.  - You can print the associated coupon and take it with your prescription to the pharmacy.  - You may also stop by our office during regular business hours and pick up a GoodRx coupon card.  - If you need your prescription sent electronically to a different pharmacy, notify our office through [facility] or by phone at [phone] option 4.     Si Usted  Necesita Algo Despus de Su Visita  Tambin puede enviarnos un mensaje a travs de Pharmacist, community. Por lo general respondemos a los mensajes de MyChart en el transcurso de 1 a 2 das hbiles.  Para renovar recetas, por favor pida a su farmacia que se ponga en contacto con nuestra oficina. Harland Dingwall de fax es [place] [phone].  Si tiene un asunto urgente cuando la clnica est cerrada y que no puede esperar hasta el siguiente da hbil, puede llamar/localizar a su doctor(a) al nmero que aparece a continuacin.   Por favor, tenga en cuenta que aunque hacemos todo lo posible para estar disponibles para asuntos urgentes fuera del horario de [place], no estamos disponibles las 24 horas del da, los 7 das de la [place].   Si tiene un problema urgente y no puede comunicarse con nosotros, puede optar por buscar atencin mdica  en el consultorio de su doctor(a), en una clnica privada, en un centro de atencin urgente o en una sala de emergencias.  Si tiene Engineering geologist, por favor llame inmediatamente al 911 o vaya a la sala de emergencias.  Nmeros de bper  - Dr. Nehemiah Massed: [phone]  - Dra. Moye: [phone]  - Dra. Nicole Kindred: [phone]  En caso de inclemencias del [place], por favor llame a Johnsie Kindred principal al [phone] para una actualizacin sobre el [place] de cualquier retraso o cierre.  Consejos para la medicacin en dermatologa: Por favor, guarde las cajas en las que vienen los medicamentos de uso tpico para ayudarle a seguir las instrucciones sobre dnde y cmo usarlos. Las farmacias generalmente imprimen las instrucciones del medicamento slo en las cajas y no directamente en los tubos del [place].   Si su medicamento es muy caro, por favor, pngase en contacto con Zigmund Daniel llamando al [phone] y presione la opcin 4 o envenos un mensaje a travs de Pharmacist, community.   No podemos decirle cul ser su copago por los medicamentos por adelantado ya que esto es  diferente dependiendo de la cobertura de su seguro. Sin embargo, es posible que podamos encontrar un medicamento sustituto a Electrical engineer un formulario para que el seguro cubra el medicamento que se considera necesario.   Si se requiere una autorizacin previa para que su compaa de seguros [place] su medicamento, por favor permtanos de 1 a 2 das hbiles para completar este proceso.  Los precios de los medicamentos varan con frecuencia dependiendo del Environmental consultant de dnde se surte la receta y alguna farmacias pueden ofrecer precios ms baratos.  El sitio web www.goodrx.com tiene cupones para medicamentos de Airline pilot. Los precios aqu no tienen en cuenta lo que podra costar con la ayuda del seguro (puede ser ms barato con su seguro), pero el sitio web puede darle el precio si no utiliz Research scientist (physical sciences).  - Puede imprimir el cupn correspondiente y llevarlo con su receta a la farmacia.  - Tambin puede pasar por nuestra oficina durante el horario de atencin regular y  recoger una tarjeta de cupones de GoodRx.  - Si necesita que su receta se enve electrnicamente a una farmacia diferente, informe a nuestra oficina a travs de MyChart de [place] o por telfono llamando al [phone] y presione la opcin 4.

## 2022-05-22 NOTE — Progress Notes (Signed)
   New Patient Visit  Subjective  [name] is a 71 y.o. female who presents for the following: Rash (Right shoulder and right chin) and Other (History of boils of left breast that are clear now). Also has rash and splits with fingers.  The following portions of the chart were reviewed this encounter and updated as appropriate:   Tobacco  Allergies  Meds  Problems  Med Hx  Surg Hx  Fam Hx     Review of Systems:  No other skin or systemic complaints except as noted in HPI or Assessment and Plan.  Objective  Well appearing patient in no apparent distress; mood and affect are within normal limits.  A focused examination was performed including arms, hands, chest. Relevant physical exam findings are noted in the Assessment and Plan.  Generalized, Right Shoulder - Anterior 3 pink plaques of left breast. 2 crusts of right shoulder, 1 or right chin  Bilateral hands Fissures and scale   Assessment & Plan  Furunculosis Right Shoulder - and breasts  Advised may have recurrent episodes.  May have some colonization of Staphylococcus in her nose.  We will address this in the future if necessary.  Start doxycycline 100 mg 1 p.o. twice daily with food  twice daily x10 days  And  mupirocin 3 times daily to affected areas for sores.  Related Medications doxycycline (VIBRA-TABS) 100 MG tablet Take 1 tablet (100 mg total) by mouth 2 (two) times daily. Food and plenty of fluid  Hand dermatitis Atopic dermatitis with fissures Bilateral hands  Atopic dermatitis (eczema) is a chronic, relapsing, pruritic condition that can significantly affect quality of life. It is often associated with allergic rhinitis and/or asthma and can require treatment with topical medications, phototherapy, or in severe cases biologic injectable medication (Dupixent; Adbry) or Oral JAK inhibitors.  Once furunculosis is resolved, start Mometasone cream qd to hands as needed for rash  mometasone (ELOCON)  0.1 % cream - Bilateral hands Apply 1 Application topically daily as needed (Rash).  Return if symptoms worsen or fail to improve.  I, Ashok Cordia, CMA, am acting as scribe for Sarina Ser, MD . Documentation: I have reviewed the above documentation for accuracy and completeness, and I agree with the above.  Sarina Ser, MD

## 2022-05-23 ENCOUNTER — Encounter: Payer: Self-pay | Admitting: Dermatology

## 2022-05-24 ENCOUNTER — Encounter: Payer: Self-pay | Admitting: Family Medicine

## 2022-05-25 ENCOUNTER — Other Ambulatory Visit: Payer: Self-pay | Admitting: Family Medicine

## 2022-06-01 ENCOUNTER — Encounter: Payer: Self-pay | Admitting: Family Medicine

## 2022-06-05 ENCOUNTER — Other Ambulatory Visit: Payer: Self-pay | Admitting: *Deleted

## 2022-06-05 MED ORDER — ISOSORBIDE MONONITRATE ER 60 MG PO TB24: 30.0000 mg | ORAL_TABLET | Freq: Two times a day (BID) | ORAL | 3 refills | Status: DC

## 2022-06-06 DIAGNOSIS — J449 Chronic obstructive pulmonary disease, unspecified: Secondary | ICD-10-CM | POA: Diagnosis not present

## 2022-06-07 DIAGNOSIS — E860 Dehydration: Secondary | ICD-10-CM | POA: Diagnosis not present

## 2022-06-08 ENCOUNTER — Encounter: Payer: Self-pay | Admitting: Pulmonary Disease

## 2022-06-08 ENCOUNTER — Ambulatory Visit: Payer: Medicare HMO | Admitting: Pulmonary Disease

## 2022-06-08 VITALS — BP 136/80 | HR 92 | Temp 98.1°F | Ht 62.0 in | Wt 217.4 lb

## 2022-06-08 DIAGNOSIS — J449 Chronic obstructive pulmonary disease, unspecified: Secondary | ICD-10-CM

## 2022-06-08 DIAGNOSIS — R5381 Other malaise: Secondary | ICD-10-CM

## 2022-06-08 DIAGNOSIS — R0602 Shortness of breath: Secondary | ICD-10-CM | POA: Diagnosis not present

## 2022-06-08 DIAGNOSIS — E669 Obesity, unspecified: Secondary | ICD-10-CM

## 2022-06-08 DIAGNOSIS — G4736 Sleep related hypoventilation in conditions classified elsewhere: Secondary | ICD-10-CM

## 2022-06-08 MED ORDER — BREZTRI AEROSPHERE 160-9-4.8 MCG/ACT IN AERO: 2.0000 | INHALATION_SPRAY | Freq: Two times a day (BID) | RESPIRATORY_TRACT | 0 refills | Status: DC

## 2022-06-08 NOTE — Patient Instructions (Signed)
We have given you a sample of the [place].  Are going to have you fill the forms for continuing the support through [place].  Continue using your oxygen at nighttime.  We will see you in follow-up in 6 months time

## 2022-06-08 NOTE — Progress Notes (Addendum)
Subjective:    Patient ID: [name], female    DOB: 13-Jan-1951, 71 y.o.   MRN: [id number] Patient Care Team: Eustaquio Boyden, MD as PCP - General (Family Medicine) Debbe Odea, MD as PCP - Cardiology (Cardiology) Carrie Mew, OD as Consulting Physician (Optometry) Kathyrn Sheriff, [facility] as Pharmacist (Pharmacist)  Chief Complaint  Patient presents with   Follow-up    SOB with exertion. No wheezing. Occasional cough with no sputum.    HPI [name] is a 71 year old former smoker who presents today for follow-up on dyspnea.  This is a scheduled visit.  She has mild COPD and morbid obesity with significant deconditioning.  Her dyspnea is out of proportion to her PFTs.  She was given a trial of Trelegy Ellipta during her last visit which helped her.  However, because of insurance issues and inability to afford the Trelegy she has been switched to [place] and medication assistance has been obtained through [employer] for the patient. She has been doing well with the [employer].  She needs to renew her forms for a [employer] program. She has been following with cardiology after she started developing exertional dyspnea after her coronary artery bypass in December 2021.  These issues have now subsided.  She has not had paroxysmal nocturnal dyspnea.  No fevers, chills or sweats.  Cough or sputum production, no hemoptysis.  She had repeat sleep study on 06/02/2021 which showed mild OSA, improvement from prior.  No need for specific therapy mostly positional change.  She does have nocturnal hypoxemia related to obesity and is on supplemental oxygen at 2 L/min.  He is compliant with the therapy.  She notes improvement in symptoms with this therapy.  Overall she feels that she is stable to mildly improved.    Review of Systems A 10 point review of systems was performed and it is as noted above otherwise negative.  Patient Active Problem List   Diagnosis Date Noted   Chest pain  02/23/2022   Postural dizziness with presyncope 02/13/2022   Oral thrush 11/13/2021   Lymphadenopathy of right cervical region 11/13/2021   Skin lesion of breast 11/13/2021   Unilateral occipital headache 01/10/2021   Abdominal aortic ectasia 10/09/2020   Back pain 09/27/2020   Gross hematuria 09/14/2020   S/P CABG x 1 08/03/2020   Abnormal cardiovascular stress test    Aortic atherosclerosis 04/15/2020   COPD (chronic obstructive pulmonary disease) 04/15/2020   Pill dysphagia 04/05/2020   Cervical neck pain with evidence of disc disease 10/23/2019   Osteoarthritis 10/23/2019   Anemia 10/23/2019   Weight loss, unintentional 10/05/2019   Hepatic steatosis 05/03/2019   Thrombocytosis 04/24/2019   Abdominal pain 04/24/2019   Migraine 04/19/2018   Obesity, morbid, BMI 40.0-49.9 08/17/2017   Palpitations 01/08/2017   Mass of right side of neck 07/10/2016   Neutrophilia 04/30/2015   Advanced care planning/counseling discussion 01/22/2015   Health maintenance examination 01/22/2015   Medicare annual wellness visit, subsequent 12/15/2013   Tachycardia 09/13/2012   Osteoarthritis of left knee 12/11/2011   Candidal intertrigo 05/10/2011   Hypertension    Bipolar disorder    Fibromyalgia    Hyperlipidemia associated with type 2 diabetes mellitus    GERD (gastroesophageal reflux disease)    Hypothyroidism 04/05/2011   OSA (obstructive sleep apnea) 03/16/2011   CAD (coronary artery disease) 03/16/2011   Type 2 diabetes mellitus with other specified complication 03/15/2011   Dyspnea 01/03/2011   Social History   Tobacco Use  Smoking status: Former    Packs/day: 2.00    Years: 43.00    Additional pack years: 0.00    Total pack years: 86.00    Types: Cigarettes    Quit date: 05/17/2010    Years since quitting: 12.5   Smokeless tobacco: Never   Tobacco comments:    QUIT 2011  Substance Use Topics   Alcohol use: No    Alcohol/week: 0.0 standard drinks of alcohol   Allergies   Allergen Reactions   Crestor [Rosuvastatin] Other (See Comments)    myalgias   Lipitor [Atorvastatin] Other (See Comments)    Mood swings   Lithium Other (See Comments)    abd pain, n/v, was hospitalized   Quetiapine Other (See Comments)    Muscle twitching, muscle spasm   Medications were reviewed with the patient.  Immunization History  Administered Date(s) Administered   Fluad Quad(high Dose 65+) 04/24/2019, 05/26/2020, 05/16/2022   Influenza Split 05/09/2011, 06/11/2012   Influenza Whole 05/20/2010   Influenza, High Dose Seasonal PF 06/01/2021   Influenza,inj,Quad PF,6+ Mos 07/14/2013, 06/16/2014, 04/30/2015, 05/31/2016, 05/10/2017, 04/19/2018   Moderna Covid-19 Vaccine Bivalent Booster 96yrs & up 06/24/2021   Moderna SARS-COV2 Booster Vaccination 06/21/2020, 12/31/2020   Moderna Sars-Covid-2 Vaccination 10/18/2019, 11/15/2019   Pneumococcal Conjugate-13 11/14/2017, 04/19/2018   Pneumococcal Polysaccharide-23 04/18/2011, 06/01/2021   Respiratory Syncytial Virus Vaccine,Recomb Aduvanted(Arexvy) 07/15/2022   Td 04/18/2011   Zoster Recombinat (Shingrix) 11/14/2017, 01/02/2018   Zoster, Live 12/29/2013      Objective:   Physical Exam BP 136/80 (BP Location: Left Arm, Cuff Size: Large)   Pulse 92   Temp 98.1 F (36.7 C)   Ht  (1.575 m)   Wt 217 lb 6.4 oz (98.6 kg)   SpO2 94%   BMI 39.76 kg/m   GENERAL: Obese woman, presents in transport chair.  No conversational dyspnea.  Looks older than stated age.  Looks very deconditioned.  She is conversant and jovial today. HEAD: Normocephalic, atraumatic. EYES: PERRL, anicteric sclera. MOUTH: Nose/mouth/throat not examined due to masking requirements for COVID 19. NECK: Supple. No thyromegaly. Trachea midline. No JVD.  No adenopathy.  Prominent dorsocervical fat pad posteriorly. PULMONARY: Good air entry bilaterally.  No adventitious sounds. CARDIOVASCULAR: S1 and S2. Regular rate and rhythm.  No rubs, murmurs or gallops  heard.   ABDOMEN: Obese, nondistended. MUSCULOSKELETAL: No joint deformity, no clubbing, no edema.  Median sternotomy scar well-healed. NEUROLOGIC: No overt focal deficit.  Speech is fluent. SKIN: Intact,warm,dry. PSYCH: Mood and behavior normal     Assessment & Plan:     ICD-10-CM   1. Stage 1 mild COPD by GOLD classification (HCC)  J44.9    Continue Breztri 2 puffs twice a day Continue as needed albuterol    2. Nocturnal hypoxemia due to obesity  E66.9    G47.36    Continue oxygen at 2 L/min Compliant with therapy Notes benefit from therapy    3. Shortness of breath  R06.02    Out of proportion to lung disease Suspect physical deconditioning big component    4. Morbid obesity (HCC)  E66.01    Recommend weight loss    5. Physical deconditioning  R53.81    Patient declines pulmonary rehab  States cannot attend due to ambulation issues     Meds ordered this encounter  Medications   Budeson-Glycopyrrol-Formoterol (BREZTRI AEROSPHERE) 160-9-4.8 MCG/ACT AERO    Sig: Inhale 2 puffs into the lungs in the morning and at bedtime.    Dispense:  5.9 g  Refill:  0    Order Specific Question:   Lot Number?    Answer:   [id number]    Order Specific Question:   Expiration Date?    Answer:   09/21/2024    Order Specific Question:   Manufacturer?    Answer:   [employer] [[id number]]    Order Specific Question:   Quantity    Answer:   1   Patient appears to be fairly well compensated from the pulmonary standpoint.  Will see him in follow-up in 6 months time she is to contact us prior to that time should any new difficulties arise.  Gailen Shelter, MD Advanced Bronchoscopy PCCM [facility]    *This note was dictated using voice recognition software/Dragon.  Despite best efforts to proofread, errors can occur which can change the meaning. Any transcriptional errors that result from this process are unintentional and may not be fully corrected at the time of  dictation.

## 2022-06-09 ENCOUNTER — Telehealth: Payer: Self-pay

## 2022-06-09 ENCOUNTER — Telehealth: Payer: Self-pay | Admitting: *Deleted

## 2022-06-09 NOTE — Patient Outreach (Signed)
  Care Coordination   Initial Visit Note   06/09/2022 Name: [name] MRN: [id number] DOB: 24-Nov-1950  [name] is a 71 y.o. year old female who sees Ria Bush, MD for primary care. I spoke with  [name] by phone today.  What matters to the patients health and wellness today?  No health concerns voiced. RN discussed [place], RN, SW, and Pharmacist. Patient declined services.     Goals Addressed             This Visit's Progress    Patient advised to follow up AWV and Vaccines          SDOH assessments and interventions completed:  Yes     Care Coordination Interventions Activated:  y   Care Coordination Interventions:  No, not indicated   Follow up plan: No further intervention required.   Encounter Outcome:  Pt. [place] Care Management [phone]

## 2022-06-09 NOTE — Telephone Encounter (Signed)
I spoke with the patient. I will mail her the information to fill out for the [facility] assistance program. She will mail or bring the paperwork back to our office once it is filled out.

## 2022-06-14 ENCOUNTER — Telehealth: Payer: Medicare HMO

## 2022-06-15 ENCOUNTER — Telehealth: Payer: Self-pay | Admitting: Family Medicine

## 2022-06-15 MED ORDER — BREZTRI AEROSPHERE 160-9-4.8 MCG/ACT IN AERO: 2.0000 | INHALATION_SPRAY | Freq: Two times a day (BID) | RESPIRATORY_TRACT | 11 refills | Status: DC

## 2022-06-15 NOTE — Telephone Encounter (Signed)
Patient would like a phone call.

## 2022-06-15 NOTE — Telephone Encounter (Signed)
Patient dropped off her portion of Breztri application. Rx and providers portion has been placed in Dr. Domingo Dimes folder for signature.

## 2022-06-15 NOTE — Telephone Encounter (Signed)
Please call patient - I suspect it has something to do with Breztri (AZ&Me) PAP renewal - I checked the online portal and she is re-enrolled for 2024 already.

## 2022-06-16 NOTE — Telephone Encounter (Signed)
Application and signed Rx has been faxed to [employer].  Received successful confirmation.  Nothing further needed.

## 2022-06-19 NOTE — Telephone Encounter (Signed)
Called patient; no answer; left message.  Lindsey Foltanski, CPP notified  Cleve Paolillo, RMA Clinical Pharmacy Assistant [phone]   

## 2022-06-21 ENCOUNTER — Telehealth: Payer: Self-pay

## 2022-06-21 ENCOUNTER — Telehealth: Payer: Medicare HMO

## 2022-06-21 NOTE — Telephone Encounter (Signed)
Received med shipment of pt's Ozempic 0.25 mg/0.5 mg (3 boxes total).  Spoke with pt notifying her of shipment above.  [Placed Ozempic 0.25 mg/0.5 mg (3 boxes total) in 2nd refrigerator, 2nd shelf.]

## 2022-06-21 NOTE — Telephone Encounter (Signed)
Patient picked up ozempic .

## 2022-06-27 ENCOUNTER — Telehealth: Payer: Self-pay

## 2022-06-27 NOTE — Progress Notes (Signed)
Chronic Care Management Pharmacy Assistant   Name: [name]  MRN: [id number] DOB: December 05, 1950  Reason for Encounter: CCM (Hypertension Disease State)  Recent office visits:  05/16/22 Ria Bush, MD DM A1c 6.0 Change: acetaminophen (TYLENOL 8 HOUR ARTHRITIS PAIN) 650 MG CR tablet Change: metFORMIN (GLUCOPHAGE) 500 MG tablet Stop: Melatonin 5 MG CAPS Stop: naproxen sodium (ALEVE) 220 MG tablet Administered: Fluad Quad Flu Vaccine FU 3 months   Recent consult visits:  06/08/22 Vernard Gambles, MD (Pulmonology) Change: Budeson-Glycopyrrol-Formoterol (BREZTRI AEROSPHERE) 160-9-4.8 MCG/ACT AERO FU 6 months 05/22/22 Sarina Ser, MD (Dermatology) Rash Start: doxycycline (VIBRA-TABS) 100 MG tablet Start: mometasone (ELOCON) 0.1 % cream   Hospital visits:  None in previous 6 months  Medications: Outpatient Encounter Medications as of 06/27/2022  Medication Sig   acetaminophen (TYLENOL 8 HOUR ARTHRITIS PAIN) 650 MG CR tablet Take 650 mg by mouth every 8 (eight) hours as needed for pain.   albuterol (VENTOLIN HFA) 108 (90 Base) MCG/ACT inhaler Inhale 2 puffs into the lungs every 6 (six) hours as needed for wheezing or shortness of breath.   Apple Cider Vinegar 600 MG CAPS    aspirin 81 MG EC tablet Take 1 tablet (81 mg total) by mouth daily.   aspirin-acetaminophen-caffeine (EXCEDRIN MIGRAINE) 250-250-65 MG tablet Take by mouth every 6 (six) hours as needed for headache.   Boswellia-Glucosamine-Vit D (OSTEO BI-FLEX ONE PER DAY PO) Take 1 tablet by mouth daily.   Budeson-Glycopyrrol-Formoterol (BREZTRI AEROSPHERE) 160-9-4.8 MCG/ACT AERO Inhale 2 puffs into the lungs in the morning and at bedtime.   Budeson-Glycopyrrol-Formoterol (BREZTRI AEROSPHERE) 160-9-4.8 MCG/ACT AERO Inhale 2 puffs into the lungs in the morning and at bedtime.   cholecalciferol (VITAMIN D3) 25 MCG (1000 UNIT) tablet Take 1,000 Units by mouth daily.   cyclobenzaprine (FLEXERIL) 10 MG tablet TAKE 1/2 TO 1 TABLET  BY MOUTH TWICE A DAY AS NEEDED FOR MUSCLE SPASMS AND MIGRAINE   famotidine (PEPCID) 20 MG tablet Take 20 mg by mouth every other day. Alternates with omeprazole   gabapentin (NEURONTIN) 100 MG capsule TAKE 1 TO 3 CAPSULES AT    BEDTIME (Patient taking differently: Takes 1 tablet 2 times a day)   IBUPROFEN PO Take by mouth as needed.   isosorbide mononitrate (IMDUR) 60 MG 24 hr tablet Take 0.5 tablets (30 mg total) by mouth 2 (two) times daily.   levothyroxine (SYNTHROID) 100 MCG tablet TAKE 1 TABLET DAILY, AND ON2 DAYS A WEEK, TAKE AN     EXTRA 1/2 TABLET   loratadine (CLARITIN) 10 MG tablet Take 1 tablet (10 mg total) by mouth daily.   metFORMIN (GLUCOPHAGE) 500 MG tablet Take 1 tablet (500 mg total) by mouth daily with breakfast AND 2 tablets (1,000 mg total) every evening.   metoprolol succinate (TOPROL-XL) 25 MG 24 hr tablet TAKE 1 TABLET (25 MG TOTAL) BY MOUTH DAILY. (Patient taking differently: Take 25 mg by mouth at bedtime.)   mometasone (ELOCON) 0.1 % cream Apply 1 Application topically daily as needed (Rash).   Multiple Vitamin (MULTIVITAMIN WITH MINERALS) TABS tablet Take 1 tablet by mouth daily. One A Day for Women   mupirocin ointment (BACTROBAN) 2 % Apply 1 Application topically 2 (two) times daily.   nystatin (MYCOSTATIN) 100000 UNIT/ML suspension Take 5 mLs (500,000 Units total) by mouth 3 (three) times daily.   nystatin cream (MYCOSTATIN) APPLY TO AFFECTED AREA TWICE A DAY   omeprazole (PRILOSEC) 20 MG capsule Take 1 capsule (20 mg total) by mouth daily. Take 1  daily-Alternates with famotidine (Patient taking differently: Take 20 mg by mouth every other day. Alternates with famotidine)   ONE TOUCH LANCETS MISC Use to check sugar twice daily Dx: E11.8   ONETOUCH ULTRA test strip USE TO CHECK BLOOD SUGAR 2 TIMES   DAILY   OXYGEN Inhale 2 L into the lungs at bedtime.   Rhubarb (ESTROVEN COMPLETE PO) Take 1 tablet by mouth daily.   Semaglutide,0.25 or 0.'5MG'$ /DOS, (OZEMPIC, 0.25 OR 0.5  MG/DOSE,) 2 MG/1.5ML SOPN Inject 0.5 mg into the skin once a week. Via [employer] PAP   simvastatin (ZOCOR) 40 MG tablet Take 1 tablet (40 mg total) by mouth at bedtime.   torsemide (DEMADEX) 20 MG tablet Take 0.5 tablets (10 mg total) by mouth as directed. Take one half tablet (10 mg) once a day and may take additional one half tablet (10 mg) as needed for weight gain or shortness of breath.   traMADol (ULTRAM) 50 MG tablet TAKE 1 TABLET (50 MG TOTAL) BY MOUTH TWICE A DAY AS NEEDED FOR MODERATE PAIN   vitamin B-12 (CYANOCOBALAMIN) 1000 MCG tablet Take 1,000 mcg by mouth daily.   ziprasidone (GEODON) 40 MG capsule TAKE 1 CAPSULE EVERY       MORNING AND AT BEDTIME   No facility-administered encounter medications on file as of 06/27/2022.    Recent Office Vitals: BP Readings from Last 3 Encounters:  06/08/22 136/80  05/16/22 126/68  04/05/22 (!) 122/50   Pulse Readings from Last 3 Encounters:  06/08/22 92  05/16/22 89  04/05/22 79    Wt Readings from Last 3 Encounters:  06/08/22 217 lb 6.4 oz (98.6 kg)  05/16/22 219 lb 8 oz (99.6 kg)  04/05/22 226 lb 4 oz (102.6 kg)     Kidney Function Lab Results  Component Value Date/Time   CREATININE 0.70 02/23/2022 08:08 AM   CREATININE 0.94 11/11/2021 10:37 AM   CREATININE 0.93 07/30/2015 04:52 PM   CREATININE 0.75 08/30/2012 06:04 AM   CREATININE 0.69 08/29/2012 06:27 AM   GFR 62.06 11/11/2021 10:37 AM   GFRNONAA >60 02/23/2022 08:08 AM   GFRNONAA >60 08/30/2012 06:04 AM   GFRAA 80 08/26/2020 02:39 PM   GFRAA >60 08/30/2012 06:04 AM       Latest Ref Rng & Units 02/23/2022    8:08 AM 11/11/2021   10:37 AM 03/04/2021   10:44 AM  BMP  Glucose 70 - 99 mg/dL 155  139  128   BUN 8 - 23 mg/dL '13  16  19   '$ Creatinine 0.44 - 1.00 mg/dL 0.70  0.94  0.93   Sodium 135 - 145 mmol/L 139  142  137   Potassium 3.5 - 5.1 mmol/L 4.1  4.8  4.0   Chloride 98 - 111 mmol/L 104  106  105   CO2 22 - 32 mmol/L '24  27  22   '$ Calcium 8.9 - 10.3 mg/dL 9.7   9.4  8.8     Contacted patient on 07/03/2022 to discuss hypertension disease state  Current antihypertensive regimen:  Isosorbide MN 60 mg -1/2 tab BID  Torsemide 20 mg -1/2 tab daily  Metoprolol succinate 25 mg daily PM  Patient verbally confirms she is taking the above medications as directed. Yes  How often are you checking your Blood Pressure? daily  she checks her blood pressure in the morning after taking her medication.  Current home BP and BG readings:   Date Blood Pressure Blood Sugar 2x day AM  PM  11/13 --------     107 ----- 11/12 132/76      109 198 11/11 138/71      143 143 11/10 128/79      111 140 11/09 142/78      123 112 11/08 138/76      135 137 11/07 147/75      108 167 11/06 147/79      122 123  Wrist or arm cuff: Arm cuff - left arm Caffeine intake: Coffee/hot chocolate - 1 cup Salt intake: Does not add salt Over the counter medications including pseudoephedrine or NSAIDs? Tylenol Arthritis and BJ brand allergy medication (1 daily)  Any readings above 180/120? No  What recent interventions/DTPs have been made by any provider to improve Blood Pressure control since last CPP Visit: No recent interventions  Any recent hospitalizations or ED visits since last visit with CPP? No  What exercise is being done to improve your Blood Pressure Control?  Patient has two bad legs and breathing issues; she is limited   Adherence Review: Is the patient currently on ACE/ARB medication? No Does the patient have >5 day gap between last estimated fill dates? No  Star Rating Drugs:  Medication:  Last Fill: Day Supply Metformin 500 mg 05/16/2022 90 Simvastatin 40 mg 04/19/2022 90  Summary of recommendations from last [place] visit (Date:04/19/2022)  Summary: CCM F/U visit -DM: A1c 6.2% (01/2022) on metformin only; she recently added Ozempic for weight loss and CV benefit, she has now completed 4 weeks of 0.25 mg dose and tolerated well, she is down about 1-2  lbs so far -HTN/HF: BP range 135/75 - 146/75 is reasonable, though not ideal < 130/80 (given DM, HF); she has history of low BP < 100/60 so would avoid increasing BP meds at this time -HLD/CAD: LDL 53 (10/2021) at goal; pt needs refill of simvastatin   Recommendations/Changes made from today's visit: -Advised to increase to Ozempic to 0.5 mg as originally prescribed -Refilled simvastatin to mail order via office refill protocol   Plan: -[place] will call patient 3 months for BP update -Pharmacist follow up televisit scheduled for 6 months -PCP 05/16/22  Care Gaps: Annual wellness visit in last year? Yes 11/11/2021 Most Recent BP reading: 136/80 on 06/08/2022  If Diabetic: Most recent A1C reading: 6.0 on 05/16/2022 Last eye exam / retinopathy screening: Up to date Last diabetic foot exam: Up to date  Upcoming appointments: Cardiology appointment on 07/19/2022 PCP appointment on 08/15/2022 CCM appointment on 10/18/2021  Charlene Brooke, CPP notified  Marijean Niemann, [place] Assistant [phone]

## 2022-07-07 ENCOUNTER — Ambulatory Visit: Payer: Medicare HMO | Admitting: Cardiology

## 2022-07-07 DIAGNOSIS — J449 Chronic obstructive pulmonary disease, unspecified: Secondary | ICD-10-CM | POA: Diagnosis not present

## 2022-07-12 ENCOUNTER — Other Ambulatory Visit: Payer: Self-pay | Admitting: Family Medicine

## 2022-07-15 ENCOUNTER — Encounter: Payer: Self-pay | Admitting: Family Medicine

## 2022-07-19 ENCOUNTER — Ambulatory Visit: Payer: Medicare HMO | Attending: Nurse Practitioner | Admitting: Nurse Practitioner

## 2022-07-19 ENCOUNTER — Encounter: Payer: Self-pay | Admitting: Nurse Practitioner

## 2022-07-19 VITALS — BP 90/58 | HR 91 | Ht 62.0 in | Wt 215.0 lb

## 2022-07-19 DIAGNOSIS — I5032 Chronic diastolic (congestive) heart failure: Secondary | ICD-10-CM

## 2022-07-19 DIAGNOSIS — I10 Essential (primary) hypertension: Secondary | ICD-10-CM | POA: Diagnosis not present

## 2022-07-19 DIAGNOSIS — I255 Ischemic cardiomyopathy: Secondary | ICD-10-CM

## 2022-07-19 DIAGNOSIS — I25118 Atherosclerotic heart disease of native coronary artery with other forms of angina pectoris: Secondary | ICD-10-CM

## 2022-07-19 DIAGNOSIS — E785 Hyperlipidemia, unspecified: Secondary | ICD-10-CM | POA: Diagnosis not present

## 2022-07-19 DIAGNOSIS — E1169 Type 2 diabetes mellitus with other specified complication: Secondary | ICD-10-CM

## 2022-07-19 NOTE — Progress Notes (Signed)
Office Visit    Patient Name: [name] Date of Encounter: 07/19/2022  Primary Care Provider:  Ria Bush, MD Primary Cardiologist:  Kate Sable, MD  Chief Complaint    71 year old female with history of CAD, hypertension, hyperlipidemia, diabetes, heart failure with midrange ejection fraction, ischemic cardiomyopathy, remote tobacco abuse, COPD, sleep apnea, obesity, and GERD, who presents for follow-up related to CAD & CHF.  Past Medical History    Past Medical History:  Diagnosis Date   Anxiety    Arthritis    per patient "all over body"   Bipolar disorder [facility])    has stopped seeing psychiatrist   CAD (coronary artery disease)    a. 06/2020 MV: small, sev, apical inf/apical defect-scar vs ischemia. EF 45%; b. 06/2020 Cath: LM 30m LAD 70ost, 928mLCX nl, RCA nl; c. 07/2020 CABG x 1: LIMA->LAD; d. 03/2022 MV: Significantly degraded imaging due to breast attenuation/obesity/extracardiac activity. No obvious isch/scar. EF 30-35% (EF 50-55% w/o rwma on f/u echo).   Cataract    Chronic bronchitis    COPD (chronic obstructive pulmonary disease) ([facility]   Depression    Dyspnea 2012   s/p pulm/cards w/u WNL, thought due to obesity/deconditioning   Fibromyalgia    GERD (gastroesophageal reflux disease)    HFmrEF (heart failure with mid-range ejection fraction) ([facility]   a. 05/2020 Echo: EF 40%, glob HK, mild LVH, Gr1 DD; b. 06/2020 EF by SPECT: 45%; c. 07/2020 intraop TEE: EF 50%; d. 03/2022 Echo: EF 50-55%, no rwma, GrI DD, mild LVH, nl RV fxn, mild MR.   History of chicken pox    HLD (hyperlipidemia)    Hypertension    Hypothyroidism    Ischemic cardiomyopathy    a. 05/2020 Echo: EF 40%; b. 06/2020 EF by SPECT: 45%; c. 07/2020 intraop TEE: EF 50%.   Migraine    OSA (obstructive sleep apnea) 03/16/2011   Pneumonia    Sleep apnea    per patient cannot tolerate CPAP   T2DM (type 2 diabetes mellitus) ([facility] [place]   DM education 06/2011   Urinary incontinence     Past Surgical History:  Procedure Laterality Date   BREAST CYST ASPIRATION Right 2003   cardiac catherization  03/2011   x3 ([place] [place] mild nonobstructive CAD   COLONOSCOPY  12/2011   hyperplastic polyp x2,. diverticulosis, rec rpt 10 yrs   CORONARY ARTERY BYPASS GRAFT N/A 08/03/2020   Procedure: CORONARY ARTERY BYPASS GRAFTING TIMES ONE USING LEFT INTERNAL MAMMARY ARTERY.;  Surgeon: BaGaye PollackMD;  Location: [facility] OR;  Service: Open Heart Surgery;  Laterality: N/A;   ESOPHAGOGASTRODUODENOSCOPY  2006   ESOPHAGOGASTRODUODENOSCOPY  05/2020   no esophageal abnormality to explain dysphagia - esophagus dilated. Erosive gastropathy - neg H pylori ([facility]  KNEE SURGERY  2006   left, torn meniscus   RIGHT/LEFT HEART CATH AND CORONARY ANGIOGRAPHY N/A 07/13/2020   Procedure: RIGHT/LEFT HEART CATH AND CORONARY ANGIOGRAPHY;  Surgeon: EnNelva BushMD;  [facility];  Service: Cardiovascular;  Laterality: N/A;   TEE WITHOUT CARDIOVERSION N/A 08/03/2020   Procedure: TRANSESOPHAGEAL ECHOCARDIOGRAM (TEE);  Surgeon: BaGaye PollackMD;  [place] [facility] Service: Open [facility] Surgery;  Laterality: N/A;   TOTAL ABDOMINAL HYSTERECTOMY  2004   fibroids, cervix remained   TOTAL KNEE ARTHROPLASTY Left 08/2012   [facility]    Allergies  Allergies  Allergen Reactions   Crestor [Rosuvastatin] Other (See Comments)    myalgias   Lipitor [  Atorvastatin] Other (See Comments)    Mood swings   Lithium Other (See Comments)    abd pain, n/v, was hospitalized   Quetiapine Other (See Comments)    Muscle twitching, muscle spasm    History of Present Illness    71 year old female with above complex past medical history including CAD, hypertension, hyperlipidemia, diabetes, heart failure with midrange ejection fraction, ischemic cardiomyopathy, remote tobacco abuse, COPD, sleep apnea, obesity, and GERD.  In the setting of generalized weakness and progressive dyspnea on  exertion, she was seen by pulmonology in the fall 2021.  Echo was performed and showed an EF of 40%, though imaging was overall suboptimal.  She was seen by cardiology with repeat limited echo again showing suboptimal imaging.  Stress testing was undertaken and showed an EF of 45% with an area of apical inferior and apical scar versus ischemia.  Diagnostic catheterization in November 2021, showed a 70% ostial LAD stenosis along with a 90% mid LAD stenosis.  She otherwise had nonobstructive disease.  She was referred to cardiothoracic surgery and subsequently underwent CABG x1 with a LIMA to the LAD on August 03, 2020.  Of note, EF by intraoperative TEE was 50%.  Postoperative course was notable for anemia, though she did not require transfusion.  In March 2022, she was evaluated emergency department with chest pain and was placed on Imdur.  Follow-up stress testing showed a moderate, fixed apical inferior and apical defect consistent with scar, without ischemia.  EF was 30 to 44%.  Imdur was titrated to 30 mg daily and later 60 mg daily, though down titration was required back to 30 mg daily in setting of soft blood pressures.  In July 2022, she was placed on torsemide therapy in the setting of volume overload.  In July 2023, she was seen at [facility] secondary to somewhat focal left-sided chest pain.  In the ED, ECG, chest x-ray, and lab work were unremarkable.  Imdur was titrated to 30 mg twice daily and she followed-up in our office a week later and subsequently underwent stress testing in August which was notable for significant degradation of imaging due to breast attenuation, obesity, and extracardiac activity, without evidence of scar or ischemia.  EF was measured at 30 to 35%.  Follow-up echo showed an EF of 50 to 55% without regional wall motion abnormalities.  Ms. Waldorf was last seen in cardiology clinic in August 2023 at which time she reported intermittent, focal, sharp, left-sided chest pain which  has been occurring since her bypass surgery, last a few seconds, resolved spontaneously.  She denies any recent change in character or severity of symptoms and we decided to continue medical therapy.  Since her last visit, she has done reasonably well.  She has been on Ozempic over the past month or so, and her weight is down 11 pounds since her last visit here.  Activity remains limited but she does quite a bit in taking care of her husband.  She has chronic, stable dyspnea on exertion.  She also continues to experience intermittent sharp left-sided chest pain as described above.  This is very brief in nature and consistent with what she has been experiencing since her bypass surgery.  She denies any prolonged episodes of chest pain, heaviness/tightness, palpitations, PND, orthopnea, dizziness, syncope, edema, or early satiety.  Home Medications    Current Outpatient Medications  Medication Sig Dispense Refill   acetaminophen (TYLENOL 8 HOUR ARTHRITIS PAIN) 650 MG CR tablet Take 650 mg by mouth  every 8 (eight) hours as needed for pain.     albuterol (VENTOLIN HFA) 108 (90 Base) MCG/ACT inhaler Inhale 2 puffs into the lungs every 6 (six) hours as needed for wheezing or shortness of breath. 18 g 3   Apple Cider Vinegar 600 MG CAPS      aspirin 81 MG EC tablet Take 1 tablet (81 mg total) by mouth daily.     aspirin-acetaminophen-caffeine (EXCEDRIN MIGRAINE) 250-250-65 MG tablet Take by mouth every 6 (six) hours as needed for headache.     Boswellia-Glucosamine-Vit D (OSTEO BI-FLEX ONE PER DAY PO) Take 1 tablet by mouth daily.     Budeson-Glycopyrrol-Formoterol (BREZTRI AEROSPHERE) 160-9-4.8 MCG/ACT AERO Inhale 2 puffs into the lungs in the morning and at bedtime. 5.9 g 0   cholecalciferol (VITAMIN D3) 25 MCG (1000 UNIT) tablet Take 1,000 Units by mouth daily.     cyclobenzaprine (FLEXERIL) 10 MG tablet TAKE 1/2 TO 1 TABLET BY MOUTH TWICE A DAY AS NEEDED FOR MUSCLE SPASMS AND MIGRAINE 30 tablet 3    famotidine (PEPCID) 20 MG tablet Take 20 mg by mouth every other day. Alternates with omeprazole     gabapentin (NEURONTIN) 100 MG capsule TAKE 1 TO 3 CAPSULES AT    BEDTIME (Patient taking differently: Takes 1 tablet 2 times a day) 200 capsule 1   IBUPROFEN PO Take by mouth as needed.     isosorbide mononitrate (IMDUR) 60 MG 24 hr tablet Take 0.5 tablets (30 mg total) by mouth 2 (two) times daily. 90 tablet 3   levothyroxine (SYNTHROID) 100 MCG tablet TAKE 1 TABLET DAILY, AND ON2 DAYS A WEEK, TAKE AN     EXTRA 1/2 TABLET 90 tablet 2   loratadine (CLARITIN) 10 MG tablet Take 1 tablet (10 mg total) by mouth daily.     metFORMIN (GLUCOPHAGE) 500 MG tablet Take 1 tablet (500 mg total) by mouth daily with breakfast AND 2 tablets (1,000 mg total) every evening. 270 tablet 1   metoprolol succinate (TOPROL-XL) 25 MG 24 hr tablet TAKE 1 TABLET (25 MG TOTAL) BY MOUTH DAILY. 90 tablet 1   mometasone (ELOCON) 0.1 % cream Apply 1 Application topically daily as needed (Rash). 45 g 1   Multiple Vitamin (MULTIVITAMIN WITH MINERALS) TABS tablet Take 1 tablet by mouth daily. One A Day for Women     mupirocin ointment (BACTROBAN) 2 % Apply 1 Application topically 2 (two) times daily. 15 g 3   nystatin (MYCOSTATIN) 100000 UNIT/ML suspension Take 5 mLs (500,000 Units total) by mouth 3 (three) times daily. 120 mL 0   nystatin cream (MYCOSTATIN) APPLY TO AFFECTED AREA TWICE A DAY 30 g 1   omeprazole (PRILOSEC) 20 MG capsule Take 1 capsule (20 mg total) by mouth daily. Take 1 daily-Alternates with famotidine (Patient taking differently: Take 20 mg by mouth every other day. Alternates with famotidine) 90 capsule 3   ONE TOUCH LANCETS MISC Use to check sugar twice daily Dx: E11.8 200 each 3   ONETOUCH ULTRA test strip USE TO CHECK BLOOD SUGAR 2 TIMES   DAILY 200 strip 3   OXYGEN Inhale 2 L into the lungs at bedtime.     Rhubarb (ESTROVEN COMPLETE PO) Take 1 tablet by mouth daily.     Semaglutide,0.25 or 0.'5MG'$ /DOS,  (OZEMPIC, 0.25 OR 0.5 MG/DOSE,) 2 MG/1.5ML SOPN Inject 0.5 mg into the skin once a week. Via Novo Cares PAP     simvastatin (ZOCOR) 40 MG tablet Take 1 tablet (40 mg total) by  mouth at bedtime. 90 tablet 0   torsemide (DEMADEX) 20 MG tablet Take 0.5 tablets (10 mg total) by mouth as directed. Take one half tablet (10 mg) once a day and may take additional one half tablet (10 mg) as needed for weight gain or shortness of breath. 90 tablet 3   traMADol (ULTRAM) 50 MG tablet TAKE 1 TABLET (50 MG TOTAL) BY MOUTH TWICE A DAY AS NEEDED FOR MODERATE PAIN 30 tablet 0   vitamin B-12 (CYANOCOBALAMIN) 1000 MCG tablet Take 1,000 mcg by mouth daily.     ziprasidone (GEODON) 40 MG capsule TAKE 1 CAPSULE EVERY       MORNING AND AT BEDTIME 180 capsule 1   No current facility-administered medications for this visit.     Review of Systems    Chronic, stable dyspnea on exertion.  Chronic intermittent sharp, focal, fleeting left-sided chest pain.  She denies palpitations, PND, orthopnea, dizziness, syncope, edema, or early satiety all other systems reviewed and are otherwise negative except as noted above.    Physical Exam    VS:  BP (!) 90/58 (BP Location: Left Arm, Patient Position: Sitting, Cuff Size: Large)   Pulse 91   Ht '5\' 2"'$  (1.575 m)   Wt 215 lb (97.5 kg)   SpO2 98%   BMI 39.32 kg/m  , BMI Body mass index is 39.32 kg/m.     GEN: Well nourished, well developed, in no acute distress. HEENT: normal. Neck: Supple, no JVD, carotid bruits, or masses. Cardiac: RRR, no murmurs, rubs, or gallops. No clubbing, cyanosis, edema.  Radials 2+/PT 2+ and equal bilaterally.  Respiratory:  Respirations regular and unlabored, clear to auscultation bilaterally. GI: Soft, nontender, nondistended, BS + x 4. MS: no deformity or atrophy. Skin: warm and dry, no rash. Neuro:  Strength and sensation are intact. Psych: Normal affect.  Accessory Clinical Findings     Lab Results  Component Value Date   WBC 16.4  (H) 02/23/2022   HGB 11.5 (L) 02/23/2022   HCT 36.5 02/23/2022   MCV 84.9 02/23/2022   PLT 433 (H) 02/23/2022   Lab Results  Component Value Date   CREATININE 0.70 02/23/2022   BUN 13 02/23/2022   NA 139 02/23/2022   K 4.1 02/23/2022   CL 104 02/23/2022   CO2 24 02/23/2022   Lab Results  Component Value Date   ALT 13 11/11/2021   AST 14 11/11/2021   ALKPHOS 51 11/11/2021   BILITOT 0.2 11/11/2021   Lab Results  Component Value Date   CHOL 126 11/11/2021   HDL 38.50 (L) 11/11/2021   LDLCALC 48 12/08/2013   LDLDIRECT 53.0 11/11/2021   TRIG 297.0 (H) 11/11/2021   CHOLHDL 3 11/11/2021    Lab Results  Component Value Date   HGBA1C 6.0 (A) 05/16/2022    Assessment & Plan    1.  Coronary artery disease/chronic chest pain: Status post CABG x1 with LIMA to the LAD in December 2021.  Stress testing August 2023 in the setting of atypical, sharp/focal/fleeting chest pain was nonischemic.  As previously noted, she has chronic sharp and fleeting chest pain, dating back to her bypass surgery.  No recent change in severity or character.  She remains on low-dose beta-blocker, aspirin, statin, and nitrate therapy.  No further ischemic evaluation is warranted at this time.  2.  Chronic heart failure with midrange ejection fraction/ischemic cardiomyopathy: Ms. Altidor has chronic, stable dyspnea on exertion.  She is euvolemic on examination.  Most recent echo showed  an EF of 50 to 55%.  She remains on low-dose beta-blocker and has been able to reduce torsemide to just 10 mg once a day.  She is now on Ozempic therapy and weight is down 11 pounds since starting.  She is not on SGLT2 inhibitor and with relatively soft blood pressures likely best to defer as it may contribute to orthostasis.  3.  Essential hypertension: She notes that blood pressures typically run around 071 systolic at home.  She is off today at 90/58 but notes that she is asymptomatic.  She does not typically experience  presyncope/lightheadedness at home.  Given stability, will continue current regimen.  4.  Hyperlipidemia: LDL of 53 in March with normal LFTs at that time.  She remains on simvastatin therapy.  5.  Type 2 diabetes mellitus: A1c 6.0 in September.  She is on metformin and Ozempic and followed by primary care.  6.  Disposition: Follow-up in 6 months or sooner if necessary.  Murray Hodgkins, NP 07/19/2022, 10:21 AM

## 2022-07-19 NOTE — Patient Instructions (Signed)
Medication Instructions:  No changes at this time.   *If you need a refill on your cardiac medications before your next appointment, please call your pharmacy*   Lab Work: None  If you have labs (blood work) drawn today and your tests are completely normal, you will receive your results only by: [place] (if you have MyChart) OR A paper copy in the mail If you have any lab test that is abnormal or we need to change your treatment, we will call you to review the results.   Testing/Procedures: None   Follow-Up: At [facility], you and your health needs are our priority.  As part of our continuing mission to provide you with exceptional heart care, we have created designated Provider Care Teams.  These Care Teams include your primary Cardiologist (physician) and Advanced Practice Providers (APPs -  Physician Assistants and Nurse Practitioners) who all work together to provide you with the care you need, when you need it.   Your next appointment:   6 month(s)  The format for your next appointment:   In Person  Provider:   Kate Sable, MD or Murray Hodgkins, NP       Important Information About Sugar

## 2022-07-24 ENCOUNTER — Ambulatory Visit: Payer: Medicare HMO | Admitting: Cardiology

## 2022-08-04 ENCOUNTER — Telehealth: Payer: Self-pay

## 2022-08-04 DIAGNOSIS — K219 Gastro-esophageal reflux disease without esophagitis: Secondary | ICD-10-CM

## 2022-08-04 DIAGNOSIS — E1169 Type 2 diabetes mellitus with other specified complication: Secondary | ICD-10-CM

## 2022-08-04 MED ORDER — OMEPRAZOLE 20 MG PO CPDR: 20.0000 mg | DELAYED_RELEASE_CAPSULE | Freq: Every day | ORAL | 0 refills | Status: DC

## 2022-08-04 MED ORDER — SIMVASTATIN 40 MG PO TABS: 40.0000 mg | ORAL_TABLET | Freq: Every day | ORAL | 0 refills | Status: DC

## 2022-08-04 NOTE — Telephone Encounter (Signed)
E-scribed refills.  

## 2022-08-06 DIAGNOSIS — J449 Chronic obstructive pulmonary disease, unspecified: Secondary | ICD-10-CM | POA: Diagnosis not present

## 2022-08-07 ENCOUNTER — Encounter: Payer: Self-pay | Admitting: Family Medicine

## 2022-08-07 DIAGNOSIS — Z794 Long term (current) use of insulin: Secondary | ICD-10-CM

## 2022-08-07 DIAGNOSIS — E1169 Type 2 diabetes mellitus with other specified complication: Secondary | ICD-10-CM

## 2022-08-08 MED ORDER — ONETOUCH ULTRA VI STRP: ORAL_STRIP | 3 refills | Status: DC

## 2022-08-08 MED ORDER — ONETOUCH ULTRA 2 W/DEVICE KIT: PACK | 0 refills | Status: DC

## 2022-08-08 MED ORDER — ONETOUCH DELICA PLUS LANCET33G MISC: 3 refills | Status: DC

## 2022-08-08 NOTE — Telephone Encounter (Signed)
E-scribed rx for OneTouch Ultra 2 meter kit, OneTouch Ultra test strips and OneTouch Delical Plus lancets to [facility].

## 2022-08-15 ENCOUNTER — Ambulatory Visit (INDEPENDENT_AMBULATORY_CARE_PROVIDER_SITE_OTHER): Payer: Medicare HMO | Admitting: Family Medicine

## 2022-08-15 ENCOUNTER — Encounter: Payer: Self-pay | Admitting: Family Medicine

## 2022-08-15 ENCOUNTER — Telehealth: Payer: Self-pay | Admitting: Family Medicine

## 2022-08-15 VITALS — BP 120/54 | HR 94 | Temp 97.6°F | Ht 62.0 in | Wt 212.6 lb

## 2022-08-15 DIAGNOSIS — I10 Essential (primary) hypertension: Secondary | ICD-10-CM

## 2022-08-15 DIAGNOSIS — E1169 Type 2 diabetes mellitus with other specified complication: Secondary | ICD-10-CM | POA: Diagnosis not present

## 2022-08-15 LAB — POCT GLYCOSYLATED HEMOGLOBIN (HGB A1C): Hemoglobin A1C: 5.8 % — AB (ref 4.0–5.6)

## 2022-08-15 MED ORDER — METOPROLOL SUCCINATE ER 25 MG PO TB24: 12.5000 mg | ORAL_TABLET | Freq: Every day | ORAL | 1 refills | Status: DC

## 2022-08-15 MED ORDER — METOPROLOL SUCCINATE ER 25 MG PO TB24: 25.0000 mg | ORAL_TABLET | Freq: Every day | ORAL | 1 refills | Status: DC

## 2022-08-15 NOTE — Assessment & Plan Note (Signed)
Chronic, great control on current regimen. Denies hypoglycemia. Continue this.

## 2022-08-15 NOTE — Assessment & Plan Note (Signed)
Chronic, stable on current regimen - continue this.  Will not drop metoprolol dose as pulse stays in 80-90s.

## 2022-08-15 NOTE — Assessment & Plan Note (Signed)
Congratulated on ongoing weight loss with ozempic Obesity complicated by comorbidities of hypertension, hyperlipidenima, diabetes, arthritis and GERD.

## 2022-08-15 NOTE — Progress Notes (Signed)
Patient ID: [name], female    DOB: 12-31-1950, 71 y.o.   MRN: [id number]  This visit was conducted in person.  BP (!) 120/54   Pulse 94   Temp 97.6 F (36.4 C) (Temporal)   Ht _0  (1.575 m)   Wt 212 lb 9.6 oz (96.4 kg)   SpO2 94%   BMI 38.89 kg/m    CC: diabetes follow up visit  Subjective:   HPI: [name] is a 71 y.o. female presenting on 08/15/2022 for Diabetes (Here for 3 mo f/u. Pt brought in home BP monitor to compare. Reading in office today- 146/60.)   Saw Dr Patsey Berthold - now on breztri 2 puffs bid with benefit.   HTN - brings log - 100-140s/60-70s, few lows to 90/50s BP cuff/monitor from home seems to run 20 lbs above our readings.   DM - does regularly check sugars and brings log - 100-140s fasting , 110-160s postprandial. Compliant with antihyperglycemic regimen which includes: metformin 500/1000mg daily, ozempic 0.15m weekly. No nausea/constipation or epigastric pain. Denies low sugars or hypoglycemic symptoms. Denies blurry vision. Notes foot paresthesias. Last diabetic eye exam 01/2022. Glucometer brand: one touch - planning to change from ultra to verio/delica. Last foot exam: 01/2022. DSME: 06/2011. Lab Results  Component Value Date   HGBA1C 5.8 (A) 08/15/2022   Diabetic Foot Exam - Simple   No data filed    Lab Results  Component Value Date   MICROALBUR 1.6 11/11/2021         Relevant past medical, surgical, family and social history reviewed and updated as indicated. Interim medical history since our last visit reviewed. Allergies and medications reviewed and updated. Outpatient Medications Prior to Visit  Medication Sig Dispense Refill   acetaminophen (TYLENOL 8 HOUR ARTHRITIS PAIN) 650 MG CR tablet Take 650 mg by mouth every 8 (eight) hours as needed for pain.     albuterol (VENTOLIN HFA) 108 (90 Base) MCG/ACT inhaler Inhale 2 puffs into the lungs every 6 (six) hours as needed for wheezing or shortness of breath. 18 g 3   Apple Cider  Vinegar 600 MG CAPS      aspirin 81 MG EC tablet Take 1 tablet (81 mg total) by mouth daily.     aspirin-acetaminophen-caffeine (EXCEDRIN MIGRAINE) 250-250-65 MG tablet Take by mouth every 6 (six) hours as needed for headache.     Blood Glucose Monitoring Suppl (ONE TOUCH ULTRA 2) w/Device KIT Use as instructed to check blood sugar 2 times a day. 1 kit 0   Boswellia-Glucosamine-Vit D (OSTEO BI-FLEX ONE PER DAY PO) Take 1 tablet by mouth daily.     Budeson-Glycopyrrol-Formoterol (BREZTRI AEROSPHERE) 160-9-4.8 MCG/ACT AERO Inhale 2 puffs into the lungs in the morning and at bedtime. 5.9 g 0   cholecalciferol (VITAMIN D3) 25 MCG (1000 UNIT) tablet Take 1,000 Units by mouth daily.     cyclobenzaprine (FLEXERIL) 10 MG tablet TAKE 1/2 TO 1 TABLET BY MOUTH TWICE A DAY AS NEEDED FOR MUSCLE SPASMS AND MIGRAINE 30 tablet 3   famotidine (PEPCID) 20 MG tablet Take 20 mg by mouth every other day. Alternates with omeprazole     gabapentin (NEURONTIN) 100 MG capsule TAKE 1 TO 3 CAPSULES AT    BEDTIME (Patient taking differently: Takes 1 tablet 2 times a day) 200 capsule 1   glucose blood (ONETOUCH ULTRA) test strip Use as instructed to check blood sugar 2 times a day 200 strip 3   IBUPROFEN PO Take by  mouth as needed.     Lancets (ONETOUCH DELICA PLUS [id number]) MISC Use as instructed to check blood sugar 2 times a day 200 each 3   levothyroxine (SYNTHROID) 100 MCG tablet TAKE 1 TABLET DAILY, AND ON2 DAYS A WEEK, TAKE AN     EXTRA 1/2 TABLET 90 tablet 2   loratadine (CLARITIN) 10 MG tablet Take 1 tablet (10 mg total) by mouth daily.     metFORMIN (GLUCOPHAGE) 500 MG tablet Take 1 tablet (500 mg total) by mouth daily with breakfast AND 2 tablets (1,000 mg total) every evening. 270 tablet 1   mometasone (ELOCON) 0.1 % cream Apply 1 Application topically daily as needed (Rash). 45 g 1   Multiple Vitamin (MULTIVITAMIN WITH MINERALS) TABS tablet Take 1 tablet by mouth daily. One A Day for Women     mupirocin ointment  (BACTROBAN) 2 % Apply 1 Application topically 2 (two) times daily. 15 g 3   nystatin (MYCOSTATIN) 100000 UNIT/ML suspension Take 5 mLs (500,000 Units total) by mouth 3 (three) times daily. 120 mL 0   nystatin cream (MYCOSTATIN) APPLY TO AFFECTED AREA TWICE A DAY 30 g 1   omeprazole (PRILOSEC) 20 MG capsule Take 1 capsule (20 mg total) by mouth daily. Take 1 daily-Alternates with famotidine 90 capsule 0   OXYGEN Inhale 2 L into the lungs at bedtime.     Rhubarb (ESTROVEN COMPLETE PO) Take 1 tablet by mouth daily.     Semaglutide,0.25 or 0.5MG/DOS, (OZEMPIC, 0.25 OR 0.5 MG/DOSE,) 2 MG/1.5ML SOPN Inject 0.5 mg into the skin once a week. Via [employer] PAP     simvastatin (ZOCOR) 40 MG tablet Take 1 tablet (40 mg total) by mouth at bedtime. 90 tablet 0   torsemide (DEMADEX) 20 MG tablet Take 0.5 tablets (10 mg total) by mouth as directed. Take one half tablet (10 mg) once a day and may take additional one half tablet (10 mg) as needed for weight gain or shortness of breath. 90 tablet 3   traMADol (ULTRAM) 50 MG tablet TAKE 1 TABLET (50 MG TOTAL) BY MOUTH TWICE A DAY AS NEEDED FOR MODERATE PAIN 30 tablet 0   vitamin B-12 (CYANOCOBALAMIN) 1000 MCG tablet Take 1,000 mcg by mouth daily.     ziprasidone (GEODON) 40 MG capsule TAKE 1 CAPSULE EVERY       MORNING AND AT BEDTIME 180 capsule 1   metoprolol succinate (TOPROL-XL) 25 MG 24 hr tablet TAKE 1 TABLET (25 MG TOTAL) BY MOUTH DAILY. 90 tablet 1   isosorbide mononitrate (IMDUR) 60 MG 24 hr tablet Take 0.5 tablets (30 mg total) by mouth 2 (two) times daily. 90 tablet 3   No facility-administered medications prior to visit.     Per HPI unless specifically indicated in ROS section below Review of Systems  Objective:  BP (!) 120/54   Pulse 94   Temp 97.6 F (36.4 C) (Temporal)   Ht _0  (1.575 m)   Wt 212 lb 9.6 oz (96.4 kg)   SpO2 94%   BMI 38.89 kg/m   Wt Readings from Last 3 Encounters:  08/15/22 212 lb 9.6 oz (96.4 kg)  07/19/22 215 lb  (97.5 kg)  06/08/22 217 lb 6.4 oz (98.6 kg)      Physical Exam Vitals and nursing note reviewed.  Constitutional:      Appearance: Normal appearance. She is not ill-appearing.  HENT:     Mouth/Throat:     Comments: Wearing mask Eyes:  Extraocular Movements: Extraocular movements intact.     Conjunctiva/sclera: Conjunctivae normal.     Pupils: Pupils are equal, round, and reactive to light.  Cardiovascular:     Rate and Rhythm: Normal rate and regular rhythm.     Pulses: Normal pulses.     Heart sounds: Normal heart sounds. No murmur heard. Pulmonary:     Effort: Pulmonary effort is normal. No respiratory distress.     Breath sounds: Normal breath sounds. No wheezing, rhonchi or rales.  Musculoskeletal:     Right lower leg: No edema.     Left lower leg: No edema.     Comments: See HPI for foot exam if done  Skin:    General: Skin is warm and dry.     Findings: No rash.  Neurological:     Mental Status: She is alert.  Psychiatric:        Mood and Affect: Mood normal.        Behavior: Behavior normal.       Results for orders placed or performed in visit on 08/15/22  POCT glycosylated hemoglobin (Hb A1C)  Result Value Ref Range   Hemoglobin A1C 5.8 (A) 4.0 - 5.6 %   HbA1c POC (<> result, manual entry)     HbA1c, POC (prediabetic range)     HbA1c, POC (controlled diabetic range)      Assessment & Plan:   Problem List Items Addressed This Visit     Type 2 diabetes mellitus with other specified complication ([place]) - Primary    Chronic, great control on current regimen. Denies hypoglycemia. Continue this.       Relevant Orders   POCT glycosylated hemoglobin (Hb A1C) (Completed)   Hypertension    Chronic, stable on current regimen - continue this.  Will not drop metoprolol dose as pulse stays in 80-90s.       Relevant Medications   metoprolol succinate (TOPROL-XL) 25 MG 24 hr tablet   Severe obesity (BMI 35.0-39.9) with comorbidity ([place])    Congratulated on  ongoing weight loss with ozempic Obesity complicated by comorbidities of hypertension, hyperlipidenima, diabetes, arthritis and GERD.        Meds ordered this encounter  Medications   DISCONTD: metoprolol succinate (TOPROL-XL) 25 MG 24 hr tablet    Sig: Take 0.5 tablets (12.5 mg total) by mouth daily.    Dispense:  45 tablet    Refill:  1    Note new dose   metoprolol succinate (TOPROL-XL) 25 MG 24 hr tablet    Sig: Take 1 tablet (25 mg total) by mouth daily.    Dispense:  90 tablet    Refill:  1    Please use this dose 34m 1 tablet daily. This is correct dose   Orders Placed This Encounter  Procedures   POCT glycosylated hemoglobin (Hb A1C)     Patient Instructions  Continue metoprolol XL 256mnightly.  You are doing well today. Continue other medicines.  Return in 3 months for physical/wellness visit   Follow up plan: Return in about 3 months (around 11/14/2022) for annual exam, prior fasting for blood work, medicare wellness visit.  JaRia BushMD

## 2022-08-15 NOTE — Telephone Encounter (Signed)
Patient called to let Dr Darnell Level know that the wound center would like notes from when he  seen her for the boil on her breast.

## 2022-08-15 NOTE — Patient Instructions (Addendum)
Continue metoprolol XL '25mg'$  nightly.  You are doing well today. Continue other medicines.  Return in 3 months for physical/wellness visit

## 2022-08-15 NOTE — Telephone Encounter (Signed)
Spoke with pt to get clarification of message. States [facility] is asking for OV notes for the last time Dr. Darnell Level checked boil on pt's breast.   Printed 11/11/21 OV notes.

## 2022-08-16 ENCOUNTER — Other Ambulatory Visit: Payer: Self-pay | Admitting: Family Medicine

## 2022-08-16 ENCOUNTER — Encounter: Payer: Self-pay | Admitting: Family Medicine

## 2022-08-16 NOTE — Telephone Encounter (Signed)
Faxed notes to [phone].

## 2022-08-16 NOTE — Telephone Encounter (Signed)
See pt message. Plz contact pharmacy to ask them to fill new metoprolol XL '25mg'$  instructions 1 tablet daily.

## 2022-08-17 NOTE — Telephone Encounter (Signed)
Spoke with [facility] asking about asking about new rx with new sig. Told pt's ins co will not cover until 09/16/22. However, states she can take recent filled rx according to new instructions of 1 tab daily, which she will finish "early". Then when due for next refill she can let them know to fill new rx with new sig and ins co will cover.   Spoke with pt relaying message from [facility]. Pt verbalizes understanding and expresses her thanks.

## 2022-08-30 ENCOUNTER — Encounter: Payer: Self-pay | Admitting: Family Medicine

## 2022-08-30 ENCOUNTER — Encounter: Payer: Medicare HMO | Attending: Internal Medicine | Admitting: Internal Medicine

## 2022-08-30 DIAGNOSIS — E11622 Type 2 diabetes mellitus with other skin ulcer: Secondary | ICD-10-CM | POA: Insufficient documentation

## 2022-08-30 DIAGNOSIS — J449 Chronic obstructive pulmonary disease, unspecified: Secondary | ICD-10-CM | POA: Insufficient documentation

## 2022-08-30 DIAGNOSIS — X58XXXA Exposure to other specified factors, initial encounter: Secondary | ICD-10-CM | POA: Insufficient documentation

## 2022-08-30 DIAGNOSIS — I2510 Atherosclerotic heart disease of native coronary artery without angina pectoris: Secondary | ICD-10-CM | POA: Insufficient documentation

## 2022-08-30 DIAGNOSIS — G4733 Obstructive sleep apnea (adult) (pediatric): Secondary | ICD-10-CM | POA: Insufficient documentation

## 2022-08-30 DIAGNOSIS — Z87891 Personal history of nicotine dependence: Secondary | ICD-10-CM | POA: Insufficient documentation

## 2022-08-30 DIAGNOSIS — Z951 Presence of aortocoronary bypass graft: Secondary | ICD-10-CM | POA: Insufficient documentation

## 2022-08-30 DIAGNOSIS — L98492 Non-pressure chronic ulcer of skin of other sites with fat layer exposed: Secondary | ICD-10-CM | POA: Diagnosis not present

## 2022-08-30 DIAGNOSIS — E039 Hypothyroidism, unspecified: Secondary | ICD-10-CM | POA: Diagnosis not present

## 2022-08-30 DIAGNOSIS — S21002A Unspecified open wound of left breast, initial encounter: Secondary | ICD-10-CM | POA: Diagnosis not present

## 2022-08-31 ENCOUNTER — Encounter: Payer: Self-pay | Admitting: Family Medicine

## 2022-08-31 ENCOUNTER — Telehealth: Payer: Self-pay | Admitting: Pharmacist

## 2022-08-31 DIAGNOSIS — S21002A Unspecified open wound of left breast, initial encounter: Secondary | ICD-10-CM | POA: Diagnosis not present

## 2022-08-31 DIAGNOSIS — L98492 Non-pressure chronic ulcer of skin of other sites with fat layer exposed: Secondary | ICD-10-CM | POA: Diagnosis not present

## 2022-08-31 NOTE — Progress Notes (Signed)
ASLAN, MONTAGNA ([id number]) [id number] Page 1 of 8 Visit Report for 08/30/2022 Chief Complaint Document Details Patient Name: Date of Service: [place], [place] M. 08/30/2022 1:30 PM Medical Record Number: [id number] Patient Account Number: [id number] Date of Birth/Sex: Treating RN: 10/21/1950 (72 y.o. [name] Primary Care Provider: Ria Bush Other Clinician: Referring Provider: Treating Provider/Extender: Ree Kida Weeks in Treatment: 0 Information Obtained from: Patient Chief Complaint 08/30/2022; Left breast wound Electronic Signature(s) Signed: 08/30/2022 2:05:02 PM By: Kalman Shan DO Entered By: Kalman Shan on 08/30/2022 13:43:00 -------------------------------------------------------------------------------- Debridement Details Patient Name: Date of Service: [place] [name], DIA [place] M. 08/30/2022 1:30 PM Medical Record Number: [id number] Patient Account Number: [id number] Date of Birth/Sex: Treating RN: May 24, 1951 (71 y.o. [name] Primary Care Provider: Ria Bush Other Clinician: Referring Provider: Treating Provider/Extender: Ree Kida Weeks in Treatment: 0 Debridement Performed for Assessment: Wound #1 Left Breast Performed By: Physician Kalman Shan, MD Debridement Type: Debridement Level of Consciousness (Pre-procedure): Awake and Alert Pre-procedure Verification/Time Out Yes - 13:45 Taken: Start Time: 13:45 T Area Debrided (L x W): otal 0.5 (cm) x 1.5 (cm) = 0.75 (cm) Tissue and other material debrided: Non-Viable, [place] Level: Non-Viable Tissue Debridement Description: Selective/Open Wound Instrument: Curette Bleeding: Minimum Hemostasis Achieved: Pressure End Time: 13:49 Procedural Pain: 0 Post Procedural Pain: 0 Response to Treatment: Procedure was tolerated well Level of Consciousness JAE, BRUCK M ([id number])  [id number] Page 2 of 8 Level of Consciousness (Post- Awake and Alert procedure): Post Debridement Measurements of Total Wound Length: (cm) 0.5 Width: (cm) 1.5 Depth: (cm) 0.1 Volume: (cm) 0.059 Character of Wound/Ulcer Post Debridement: Improved Post Procedure Diagnosis Same as Pre-procedure Electronic Signature(s) Signed: 08/30/2022 2:05:02 PM By: Kalman Shan DO Signed: 08/31/2022 3:13:07 PM By: Carlene Coria RN Entered By: Carlene Coria on 08/30/2022 13:48:58 -------------------------------------------------------------------------------- HPI Details Patient Name: Date of Service: [place] [name], DIA NE M. 08/30/2022 1:30 PM Medical Record Number: [id number] Patient Account Number: [id number] Date of Birth/Sex: Treating RN: 02-28-1951 (72 y.o. [name] Primary Care Provider: Ria Bush Other Clinician: Referring Provider: Treating Provider/Extender: Ree Kida Weeks in Treatment: 0 History of Present Illness HPI Description: 08/30/2022 Ms. Suprina Thaker is a 72 year old female with a past medical history of controlled type II by diabetes on oral agents, COPD, CAD s/p CABG x 1, OSA, hypothyroidism and bipolar disorder that presents to the clinic for a one year history of nonhealing wound to the left breast. She states she has a history of wounds to her breasts that start out as boils. She visited her primary care physician for this issue last year on 11/11/2021. She was referred to dermatology at that time. She was precribed mupirocin with little benefit. She currently has 1 wound to her left breast. She denies signs of infection. Electronic Signature(s) Signed: 08/30/2022 2:05:02 PM By: Kalman Shan DO Entered By: Kalman Shan on 08/30/2022 13:53:46 -------------------------------------------------------------------------------- Physical Exam Details Patient Name: Date of Service: [place] [name], DIA NE M. 08/30/2022  1:30 PM Medical Record Number: [id number] Patient Account Number: [id number] Date of Birth/Sex: Treating RN: Nov 14, 1950 (72 y.o. [name] Primary Care Provider: Ria Bush Other Clinician: Referring Provider: Treating Provider/Extender: Ree Kida [place], [place] [name] ([id number]) [phone].pdf Page 3 of 8 Weeks in Treatment: 0 Constitutional . Psychiatric . Notes Left breast: just medial to the areola there is an open wound with granulation tissue and slough. No induration noted to the periwound. No increased warmth, erythema or purulent drainage. Electronic  Signature(s) Signed: 08/30/2022 2:05:02 PM By: Kalman Shan DO Entered By: Kalman Shan on 08/30/2022 13:54:28 -------------------------------------------------------------------------------- Physician Orders Details Patient Name: Date of Service: MA [name], DIA NE M. 08/30/2022 1:30 PM Medical Record Number: [id number] Patient Account Number: [id number] Date of Birth/Sex: Treating RN: January 26, 1951 (72 y.o. [name] Primary Care Provider: Ria Bush Other Clinician: Referring Provider: Treating Provider/Extender: Hattie Perch in Treatment: 0 Verbal / Phone Orders: No Diagnosis Coding ICD-10 Coding Code Description P95.093O Unspecified open wound of left breast, initial encounter L98.492 Non-pressure chronic ulcer of skin of other sites with fat layer exposed E11.622 Type 2 diabetes mellitus with other skin ulcer Follow-up Appointments Return Appointment in 1 week. Bathing/ Shower/ Hygiene May shower; gently cleanse wound with antibacterial soap, rinse and pat dry prior to dressing wounds - patient to wash body except wound with hibiclens daily times 1 week Anesthetic (Use 'Patient Medications' Section for Anesthetic Order Entry) Lidocaine applied to wound bed Wound Treatment Wound #1 - Breast Wound Laterality:  Left Cleanser: Byram Ancillary Kit - 15 Day Supply (DME) (Generic) 1 x Per Day/30 Days Discharge Instructions: Use supplies as instructed; Kit contains: (15) Saline Bullets; (15) 3x3 Gauze; 15 pr Gloves Cleanser: Wound Cleanser 1 x Per Day/30 Days Discharge Instructions: Wash your hands with soap and water. Remove old dressing, discard into plastic bag and place into trash. Cleanse the wound with Wound Cleanser prior to applying a clean dressing using gauze sponges, not tissues or cotton balls. Do not scrub or use excessive force. Pat dry using gauze sponges, not tissue or cotton balls. Prim Dressing: Honey: Activon Honey Gel, 25 (g) Tube ary 1 x Per Day/30 Days Prim Dressing: Hydrofera Blue Ready Transfer Foam, 2.5x2.5 (in/in) 1 x Per Day/30 Days ary Discharge Instructions: Apply Hydrofera Blue Ready to wound bed as directed Secondary Dressing: (BORDER) Zetuvit Plus SILICONE BORDER Dressing 4x4 (in/in) (DME) (Generic) 1 x Per Day/30 Days GERDA, YIN ([id number]) [id number] Page 4 of 8 Discharge Instructions: Please do not put silicone bordered dressings under wraps. Use non-bordered dressing only. Electronic Signature(s) Signed: 08/30/2022 2:08:15 PM By: Carlene Coria RN Signed: 08/30/2022 4:40:21 PM By: Kalman Shan DO Previous Signature: 08/30/2022 2:05:02 PM Version By: Kalman Shan DO Entered By: Carlene Coria on 08/30/2022 14:08:14 -------------------------------------------------------------------------------- Problem List Details Patient Name: Date of Service: [place] [name], DIA NE M. 08/30/2022 1:30 PM Medical Record Number: [id number] Patient Account Number: [id number] Date of Birth/Sex: Treating RN: 1951-01-13 (72 y.o. [name] Primary Care Provider: Ria Bush Other Clinician: Referring Provider: Treating Provider/Extender: Ree Kida Weeks in Treatment: 0 Active Problems ICD-10 Encounter Code Description  Active Date MDM Diagnosis S21.002A Unspecified open wound of left breast, initial encounter 08/30/2022 No Yes L98.492 Non-pressure chronic ulcer of skin of other sites with fat layer exposed 08/30/2022 No Yes E11.622 Type 2 diabetes mellitus with other skin ulcer 08/30/2022 No Yes Inactive Problems Resolved Problems Electronic Signature(s) Signed: 08/30/2022 2:05:02 PM By: Kalman Shan DO Entered By: Kalman Shan on 08/30/2022 13:42:45 Progress Note Details -------------------------------------------------------------------------------- Marina Goodell ([id number]) [id number] Page 5 of 8 Patient Name: Date of Service: MA [place], [place] M. 08/30/2022 1:30 PM Medical Record Number: [id number] Patient Account Number: [id number] Date of Birth/Sex: Treating RN: 1950-09-13 (72 y.o. [name] Primary Care Provider: Ria Bush Other Clinician: Referring Provider: Treating Provider/Extender: Ree Kida Weeks in Treatment: 0 Subjective Chief Complaint Information obtained from Patient 08/30/2022; Left breast wound History of Present Illness (HPI) 08/30/2022 Ms. Tequia  Gasiorowski is a 72 year old female with a past medical history of controlled type II by diabetes on oral agents, COPD, CAD s/p CABG x 1, OSA, hypothyroidism and bipolar disorder that presents to the clinic for a one year history of nonhealing wound to the left breast. She states she has a history of wounds to her breasts that start out as boils. She visited her primary care physician for this issue last year on 11/11/2021. She was referred to dermatology at that time. She was precribed mupirocin with little benefit. She currently has 1 wound to her left breast. She denies signs of infection. Patient History Allergies Crestor, Lipitor, lithium, quetiapine Social History Former smoker, Marital Status - Married, Alcohol Use - Never, Drug Use - No History, Caffeine Use -  Daily. Medical History Respiratory Patient has history of Chronic Obstructive Pulmonary Disease (COPD) Cardiovascular Patient has history of Coronary Artery Disease, Hypertension Review of Systems (ROS) Eyes Complains or has symptoms of Glasses / Contacts. Integumentary (Skin) Complains or has symptoms of Wounds. Psychiatric Complains or has symptoms of Anxiety. Objective Constitutional Vitals Time Taken: 1:08 PM, Height: 62 in, Source: Stated, Weight: 211 lbs, Source: Stated, BMI: 38.6, Temperature: 97.7 F, Pulse: 51 bpm, Respiratory Rate: 16 breaths/min, Blood Pressure: 138/66 mmHg. General Notes: Left breast: just medial to the areola there is an open wound with granulation tissue and slough. No induration noted to the periwound. No increased warmth, erythema or purulent drainage. Integumentary (Hair, Skin) Wound #1 status is Open. Original cause of wound was Gradually Appeared. The date acquired was: 06/04/2022. The wound is located on the Left Breast. The wound measures 0.5cm length x 1.5cm width x 0.1cm depth; 0.589cm^2 area and 0.059cm^3 volume. There is Fat Layer (Subcutaneous Tissue) exposed. There is no tunneling or undermining noted. There is a medium amount of serosanguineous drainage noted. There is small (1-33%) pink granulation within the wound bed. There is a large (67-100%) amount of necrotic tissue within the wound bed including Adherent Slough. Assessment Active Problems ICD-10 Unspecified open wound of left breast, initial encounter Non-pressure chronic ulcer of skin of other sites with fat layer exposed Type 2 diabetes mellitus with other skin ulcer Patient presents with a 1 year history of nonhealing ulcer to the left breast. This started out as a boil/abscess per patient. Not entirely clear why this has not SALVATRICE, MORANDI ([id number]) [id number] Page 6 of 8 healed in a year. During the encounter she was picking at the area so  maybe this is causing it to stay open. It is small and appears well-healing. I debrided nonviable tissue. At this time I recommended Medihoney and Hydrofera Blue. I recommended she obtain a wound cleanser to use before dressing changes. The area does not appear infected. Since she is having recurrent boils to her breast I recommended Hibiclens daily for the next week. Follow-up in 1 week. Procedures Wound #1 Pre-procedure diagnosis of Wound #1 is an Abscess located on the Left Breast . There was a Selective/Open Wound Non-Viable Tissue Debridement with a total area of 0.75 sq cm performed by Kalman Shan, MD. With the following instrument(s): Curette to remove Non-Viable tissue/material. Material removed includes [place]. No specimens were taken. A time out was conducted at 13:45, prior to the start of the procedure. A Minimum amount of bleeding was controlled with Pressure. The procedure was tolerated well with a pain level of 0 throughout and a pain level of 0 following the procedure. Post Debridement Measurements: 0.5cm length x 1.5cm width x  0.1cm depth; 0.059cm^3 volume. Character of Wound/Ulcer Post Debridement is improved. Post procedure Diagnosis Wound #1: Same as Pre-Procedure Plan Follow-up Appointments: Return Appointment in 1 week. Bathing/ Shower/ Hygiene: May shower; gently cleanse wound with antibacterial soap, rinse and pat dry prior to dressing wounds Anesthetic (Use 'Patient Medications' Section for Anesthetic Order Entry): Lidocaine applied to wound bed WOUND #1: - Breast Wound Laterality: Left Cleanser: Byram Ancillary Kit - 15 Day Supply (DME) (Generic) 1 x Per Day/30 Days Discharge Instructions: Use supplies as instructed; Kit contains: (15) Saline Bullets; (15) 3x3 Gauze; 15 pr Gloves Prim Dressing: Honey: Activon Honey Gel, 25 (g) Tube 1 x Per Day/30 Days ary Prim Dressing: Hydrofera Blue Ready Transfer Foam, 2.5x2.5 (in/in) 1 x Per Day/30 Days ary Discharge  Instructions: Apply Hydrofera Blue Ready to wound bed as directed Secondary Dressing: (BORDER) Zetuvit Plus SILICONE BORDER Dressing 4x4 (in/in) (DME) (Generic) 1 x Per Day/30 Days Discharge Instructions: Please do not put silicone bordered dressings under wraps. Use non-bordered dressing only. 1. In office sharp debridement 2. Hibiclens 3. Medihoney and Hydrofera Blue 4. Wound cleanser 5. Follow-up in 1 week Electronic Signature(s) Signed: 08/30/2022 2:05:02 PM By: Kalman Shan DO Entered By: Kalman Shan on 08/30/2022 13:58:11 -------------------------------------------------------------------------------- ROS/PFSH Details Patient Name: Date of Service: [place] [name], DIA NE M. 08/30/2022 1:30 PM Medical Record Number: [id number] Patient Account Number: [id number] Date of Birth/Sex: Treating RN: 04-27-1951 (72 y.o. [name] Primary Care Provider: Ria Bush Other Clinician: Referring Provider: Treating Provider/Extender: Ree Kida Weeks in Treatment: 0 Eyes Complaints and Symptoms: Positive for: Glasses / Contacts Integumentary (Skin) LANISSA, CASHEN ([id number]) [id number] Page 7 of 8 Complaints and Symptoms: Positive for: Wounds Psychiatric Complaints and Symptoms: Positive for: Anxiety Respiratory Medical History: Positive for: Chronic Obstructive Pulmonary Disease (COPD) Cardiovascular Medical History: Positive for: Coronary Artery Disease; Hypertension Immunizations Pneumococcal Vaccine: Received Pneumococcal Vaccination: Yes Received Pneumococcal Vaccination On or After 60th Birthday: Yes Implantable Devices None Family and Social History Former smoker; Marital Status - Married; Alcohol Use: Never; Drug Use: No History; Caffeine Use: Daily Electronic Signature(s) Signed: 08/30/2022 2:05:02 PM By: Kalman Shan DO Signed: 08/31/2022 3:13:07 PM By: Carlene Coria RN Entered By: Carlene Coria on  08/30/2022 13:13:00 -------------------------------------------------------------------------------- SuperBill Details Patient Name: Date of Service: [place] [name], DIA NE M. 08/30/2022 Medical Record Number: [id number] Patient Account Number: [id number] Date of Birth/Sex: Treating RN: 1950-09-15 (72 y.o. [name] Primary Care Provider: Ria Bush Other Clinician: Referring Provider: Treating Provider/Extender: Ree Kida Weeks in Treatment: 0 Diagnosis Coding ICD-10 Codes Code Description T26.712W Unspecified open wound of left breast, initial encounter L98.492 Non-pressure chronic ulcer of skin of other sites with fat layer exposed E11.622 Type 2 diabetes mellitus with other skin ulcer Facility Procedures : CPT4 Code: [id number] Description: 82505 - WOUND CARE VISIT-LEV 2 EST PT Modifier: Quantity: 1 : [place], CPT4 Code: [id number] [place] ([id number]) Description: [id number] - DEBRIDE WOUND 1ST 20 SQ CM OR < ICD-10 Diagnosis Description S21.002A Unspecified open wound of left breast, initial encounter [id number] Modifier: [id number] Quantity: 1 [id number] Page 8 of 8 Physician Procedures : CPT4 Code Description Modifier [id number] [phone] - WC PHYS LEVEL 4 - NEW PT ICD-10 Diagnosis Description S21.002A Unspecified open wound of left breast, initial encounter L98.492 Non-pressure chronic ulcer of skin of other sites with fat layer exposed  E11.622 Type 2 diabetes mellitus with other skin ulcer Quantity: 1 : [id number] - WC PHYS DEBR WO ANESTH 20 SQ CM ICD-10 Diagnosis Description S21.002A Unspecified  open wound of left breast, initial encounter Quantity: 1 Electronic Signature(s) Signed: 08/31/2022 2:29:46 PM By: Carlene Coria RN Previous Signature: 08/30/2022 2:05:02 PM Version By: Kalman Shan DO Entered By: Carlene Coria on 08/31/2022 14:29:46

## 2022-08-31 NOTE — Progress Notes (Signed)
DARENE, NAPPI [name] ([id number]) [phone] [id number] Page 1 of 5 Visit Report for 08/30/2022 Abuse Risk Screen Details Patient Name: Date of Service: [name], Jessica [place] [name]. 08/30/2022 1:30 PM Medical Record Number: [id number] Patient Account Number: [id number] Date of Birth/Sex: Treating RN: Oct 29, 1950 (72 y.o. [name] Primary Care [place] Cordts: Ria Bush Other Clinician: Referring Shine Mikes: Treating Lissett Favorite/Extender: Ree Kida Weeks in Treatment: 0 Abuse Risk Screen Items Answer ABUSE RISK SCREEN: Has anyone close to you tried to hurt or harm you recentlyo No Do you feel uncomfortable with anyone in your familyo No Has anyone forced you do things that you didnt want to doo No Electronic Signature(s) Signed: 08/31/2022 3:13:07 PM By: Carlene Coria RN Entered By: Carlene Coria on 08/30/2022 13:13:11 -------------------------------------------------------------------------------- Activities of Daily Living Details Patient Name: Date of ServiceLeverne Humbles [place] [name]. 08/30/2022 1:30 PM Medical Record Number: [id number] Patient Account Number: [id number] Date of Birth/Sex: Treating RN: 02-12-1951 (72 y.o. [name] Primary Care Tiasha Helvie: Ria Bush Other Clinician: Referring Money Mckeithan: Treating Billie Intriago/Extender: Ree Kida Weeks in Treatment: 0 Activities of Daily Living Items Answer Activities of Daily Living (Please select one for each item) Drive Automobile Completely Able T Medications ake Completely Able Use T elephone Completely Able Care for Appearance Completely Able Use T oilet Completely Able Bath / Shower Completely Able Dress Self Completely Able Feed Self Completely Able Walk Completely Able Get In / Out Bed Completely Able Housework Completely Reserve, [place] ([id number]) [id number] Page 2 of 5 Prepare Meals Completely Able Handle Money  Completely Able Shop for Self Completely Able Electronic Signature(s) Signed: 08/31/2022 3:13:07 PM By: Carlene Coria RN Entered By: Carlene Coria on 08/30/2022 13:13:33 -------------------------------------------------------------------------------- Education Screening Details Patient Name: Date of Service: [name], [name]. 08/30/2022 1:30 PM Medical Record Number: [id number] Patient Account Number: [id number] Date of Birth/Sex: Treating RN: February 06, 1951 (72 y.o. [name] Primary Care Ambar Raphael: Ria Bush Other Clinician: Referring Shantella Blubaugh: Treating Summer Mccolgan/Extender: Hattie Perch in Treatment: 0 Primary Learner Assessed: Patient Learning Preferences/Education Level/Primary Language Learning Preference: Explanation Highest Education Level: High School Preferred Language: English Cognitive Barrier Language Barrier: No Translator Needed: No Memory Deficit: No Emotional Barrier: No Cultural/Religious Beliefs Affecting Medical Care: No Physical Barrier Impaired Vision: Yes Glasses Impaired Hearing: No Decreased Hand dexterity: No Knowledge/Comprehension Knowledge Level: Medium Comprehension Level: High Ability to understand written instructions: High Ability to understand verbal instructions: High Motivation Anxiety Level: Anxious Cooperation: Cooperative Education Importance: Acknowledges Need Interest in Health Problems: Asks Questions Perception: Coherent Willingness to Engage in Self-Management High Activities: Readiness to Engage in Self-Management High Activities: Electronic Signature(s) Signed: 08/31/2022 3:13:07 PM By: Carlene Coria RN Entered By: Carlene Coria on 08/30/2022 13:14:05 [name] ([id number]) [id number] Page 3 of 5 -------------------------------------------------------------------------------- Fall Risk Assessment Details Patient Name: Date of Service: [name], Jessica [place]  [name]. 08/30/2022 1:30 PM Medical Record Number: [id number] Patient Account Number: [id number] Date of Birth/Sex: Treating RN: 08/22/1950 (72 y.o. [name] Primary Care Trinitey Roache: Ria Bush Other Clinician: Referring Dayvion Sans: Treating Cheralyn Oliver/Extender: Hattie Perch in Treatment: 0 Fall Risk Assessment Items Have you had 2 or more falls in the last 12 monthso 0 No Have you had any fall that resulted in injury in the last 12 monthso 0 No FALLS RISK SCREEN History of falling - immediate or within 3 months 0 No Secondary diagnosis (Do you have 2 or more medical diagnoseso) 0 No Ambulatory aid None/bed rest/wheelchair/nurse  0 No Crutches/cane/walker 15 Yes Furniture 0 No Intravenous therapy Access/Saline/Heparin Lock 0 No Gait/Transferring Normal/ bed rest/ wheelchair 0 Yes Weak (short steps with or without shuffle, stooped but able to lift head while walking, may seek 0 No support from furniture) Impaired (short steps with shuffle, may have difficulty arising from chair, head down, impaired 0 No balance) Mental Status Oriented to own ability 0 Yes Electronic Signature(s) Signed: 08/31/2022 3:13:07 PM By: Carlene Coria RN Entered By: Carlene Coria on 08/30/2022 13:14:37 -------------------------------------------------------------------------------- Foot Assessment Details Patient Name: Date of Service: [name], [name]. 08/30/2022 1:30 PM Medical Record Number: [id number] Patient Account Number: [id number] Date of Birth/Sex: Treating RN: July 02, 1951 (72 y.o. [name] Primary Care Virginio Isidore: Ria Bush Other Clinician: Referring Tyrome Donatelli: Treating Yaniv Lage/Extender: Ree Kida Weeks in Treatment: 0 Foot Assessment Items Site Locations [place], [place] [name] ([id number]) [phone] [id number] Page 4 of 5 + = Sensation present, - = Sensation absent, C = Callus, U = Ulcer R = Redness, W =  Warmth, [name] = Maceration, PU = Pre-ulcerative lesion F = Fissure, S = Swelling, D = Dryness Assessment Right: Left: Other Deformity: No No Prior Foot Ulcer: No No Prior Amputation: No No Charcot Joint: No No Ambulatory Status: Ambulatory Without Help Gait: Steady Electronic Signature(s) Signed: 08/31/2022 3:13:07 PM By: Carlene Coria RN Entered By: Carlene Coria on 08/30/2022 13:15:25 -------------------------------------------------------------------------------- Nutrition Risk Screening Details Patient Name: Date of Service: [name], [name]. 08/30/2022 1:30 PM Medical Record Number: [id number] Patient Account Number: [id number] Date of Birth/Sex: Treating RN: 03/04/1951 (72 y.o. [name] Primary Care Marckus Hanover: Ria Bush Other Clinician: Referring Vonna Brabson: Treating Rocket Gunderson/Extender: Ree Kida Weeks in Treatment: 0 Height (in): 62 Weight (lbs): 211 Body Mass Index (BMI): 38.6 Nutrition Risk Screening Items Score Screening NUTRITION RISK SCREEN: I have an illness or condition that made me change the kind and/or amount of food I eat 0 No I eat fewer than two meals per day 0 No I eat few fruits and vegetables, or milk products 0 No I have three or more drinks of beer, liquor or wine almost every day 0 No I have tooth or mouth problems that make it hard for me to eat 0 No I don't always have enough money to buy the food I need 0 No [name], [name] ([id number]) [id number] Page 5 of 5 I eat alone most of the time 0 No I take three or more different prescribed or over-the-counter drugs a day 1 Yes Without wanting to, I have lost or gained 10 pounds in the last six months 0 No I am not always physically able to shop, cook and/or feed myself 0 No Nutrition Protocols Good Risk Protocol 0 No interventions needed Moderate Risk Protocol High Risk Proctocol Risk Level: Good Risk Score: 1 Electronic  Signature(s) Signed: 08/31/2022 3:13:07 PM By: Carlene Coria RN Entered By: Carlene Coria on 08/30/2022 13:15:18

## 2022-08-31 NOTE — Telephone Encounter (Signed)
Patient has [employer] and reports copay for Ozempic is cost prohibitive at this time.  Reviewed application process for [employer] patient assistance program. Patient meets income/out of pocket spend criteria for the program. Completed application via online portal and submitted to [employer]. Will await response from manufacturer.

## 2022-08-31 NOTE — Progress Notes (Signed)
Jessica, [name] ([id number]) [id number] Page 1 of 9 Visit Report for 08/30/2022 Allergy List Details Patient Name: Date of Service: [name], Jessica [place] [name]. 08/30/2022 1:30 PM Medical Record Number: [id number] Patient Account Number: [id number] Date of Birth/Sex: Treating RN: 17-Mar-1951 (72 y.o. [name] Primary Care Inette Doubrava: Ria Bush Other Clinician: Referring Pat Elicker: Treating Leimomi Zervas/Extender: Ree Kida Weeks in Treatment: 0 Allergies Active Allergies Crestor Lipitor lithium quetiapine Allergy Notes Electronic Signature(s) Signed: 08/31/2022 3:13:07 PM By: Carlene Coria RN Entered By: Carlene Coria on 08/30/2022 13:10:15 -------------------------------------------------------------------------------- Arrival Information Details Patient Name: Date of Service: [name], Jessica [place] [name]. 08/30/2022 1:30 PM Medical Record Number: [id number] Patient Account Number: [id number] Date of Birth/Sex: Treating RN: [birth year] (71 y.o. [name] Primary Care Nathalee Smarr: Ria Bush Other Clinician: Referring Makya Yurko: Treating Treasure Ingrum/Extender: Hattie Perch in Treatment: 0 Visit Information Patient Arrived: [name] Arrival Time: 13:08 Accompanied By: self Transfer Assistance: None Patient Identification Verified: Yes Secondary Verification Process Completed: Yes Patient Requires Transmission-Based Precautions: No Patient Has Alerts: No Jessica, [name] ([id number]) [id number] Page 2 of 9 Electronic Signature(s) Signed: 08/31/2022 3:13:07 PM By: Carlene Coria RN Entered By: Carlene Coria on 08/30/2022 13:08:40 -------------------------------------------------------------------------------- Clinic Level of Care Assessment Details Patient Name: Date of Service: [name], Jessica [place] [name]. 08/30/2022 1:30 PM Medical Record Number: [id number] Patient Account Number: [id number] Date  of Birth/Sex: Treating RN: 1950/10/17 (72 y.o. [name] Primary Care Osmond Steckman: Ria Bush Other Clinician: Referring Aissa Lisowski: Treating Ameir Faria/Extender: Hattie Perch in Treatment: 0 Clinic Level of Care Assessment Items TOOL 1 Quantity Score X- 1 0 Use when EandM and Procedure is performed on INITIAL visit ASSESSMENTS - Nursing Assessment / Reassessment X- 1 20 General Physical Exam (combine w/ comprehensive assessment (listed just below) when performed on new pt. evals) X- 1 25 Comprehensive Assessment (HX, ROS, Risk Assessments, Wounds Hx, etc.) ASSESSMENTS - Wound and Skin Assessment / Reassessment '[]'$  - 0 Dermatologic / Skin Assessment (not related to wound area) ASSESSMENTS - Ostomy and/or Continence Assessment and Care '[]'$  - 0 Incontinence Assessment and Management '[]'$  - 0 Ostomy Care Assessment and Management (repouching, etc.) PROCESS - Coordination of Care X - Simple Patient / Family Education for ongoing care 1 15 '[]'$  - 0 Complex (extensive) Patient / Family Education for ongoing care '[]'$  - 0 Staff obtains Programmer, systems, Records, T Results / Process Orders est '[]'$  - 0 Staff telephones HHA, Nursing Homes / Clarify orders / etc '[]'$  - 0 Routine Transfer to another Facility (non-emergent condition) '[]'$  - 0 Routine Hospital Admission (non-emergent condition) X- 1 15 New Admissions / Biomedical engineer / Ordering NPWT Apligraf, etc. , '[]'$  - 0 Emergency Hospital Admission (emergent condition) PROCESS - Special Needs '[]'$  - 0 Pediatric / Minor Patient Management '[]'$  - 0 Isolation Patient Management '[]'$  - 0 Hearing / Language / Visual special needs '[]'$  - 0 Assessment of Community assistance (transportation, D/C planning, etc.) '[]'$  - 0 Additional assistance / Altered mentation '[]'$  - 0 Support Surface(s) Assessment (bed, cushion, seat, etc.) INTERVENTIONS - Miscellaneous '[]'$  - 0 External ear exam Jessica, HAUCK [name] ([id number])  [id number] Page 3 of 9 '[]'$  - 0 Patient Transfer (multiple staff / Civil Service fast streamer / Similar devices) '[]'$  - 0 Simple Staple / Suture removal (25 or less) '[]'$  - 0 Complex Staple / Suture removal (26 or more) '[]'$  - 0 Hypo/Hyperglycemic Management (do not check if billed separately) '[]'$  - 0 Ankle / Brachial Index (ABI) - do not check if  billed separately Has the patient been seen at the hospital within the last three years: Yes Total Score: 75 Level Of Care: New/Established - Level 2 Electronic Signature(s) Signed: 08/31/2022 3:13:07 PM By: Carlene Coria RN Entered By: Carlene Coria on 08/31/2022 14:29:29 -------------------------------------------------------------------------------- Encounter Discharge Information Details Patient Name: Date of Service: [name], Jessica NE [name]. 08/30/2022 1:30 PM Medical Record Number: [id number] Patient Account Number: [id number] Date of Birth/Sex: Treating RN: December 17, 1950 (72 y.o. [name] Primary Care Zailyn Thoennes: Ria Bush Other Clinician: Referring Jahki Witham: Treating Colm Lyford/Extender: Hattie Perch in Treatment: 0 Encounter Discharge Information Items Post Procedure Vitals Discharge Condition: Stable Temperature (F): 97.7 Ambulatory Status: Walker Pulse (bpm): 51 Discharge Destination: Home Respiratory Rate (breaths/min): 16 Transportation: Private Auto Blood Pressure (mmHg): 138/66 Accompanied By: self Schedule Follow-up Appointment: Yes Clinical Summary of Care: Electronic Signature(s) Signed: 08/30/2022 2:09:25 PM By: Carlene Coria RN Entered By: Carlene Coria on 08/30/2022 14:09:25 -------------------------------------------------------------------------------- Lower Extremity Assessment Details Patient Name: Date of Service: Jessica Dulce, Jessica NE [name]. 08/30/2022 1:30 PM Medical Record Number: [id number] Patient Account Number: [id number] Date of Birth/Sex: Treating RN: 04/13/1951 (72 y.o. [name] Primary Care Atley Neubert: Ria Bush Other Clinician: Referring Makyra Corprew: Treating Abrey Bradway/Extender: Ree Kida Weeks in Treatment: 0 [place] [name] ([id number]) [id number] Page 4 of 9 Electronic Signature(s) Signed: 08/31/2022 3:13:07 PM By: Carlene Coria RN Entered By: Carlene Coria on 08/30/2022 13:15:36 -------------------------------------------------------------------------------- Multi Wound Chart Details Patient Name: Date of Service: [name], Jessica NE [name]. 08/30/2022 1:30 PM Medical Record Number: [id number] Patient Account Number: [id number] Date of Birth/Sex: Treating RN: 1951/03/19 (72 y.o. [name] Primary Care Maclaine Ahola: Ria Bush Other Clinician: Referring Duan Scharnhorst: Treating Jazon Jipson/Extender: Ree Kida Weeks in Treatment: 0 Vital Signs Height(in): 62 Pulse(bpm): 62 Weight(lbs): 211 Blood Pressure(mmHg): 138/66 Body Mass Index(BMI): 38.6 Temperature(F): 97.7 Respiratory Rate(breaths/min): 16 [1:Photos:] [N/A:N/A] Left Breast N/A N/A Wound Location: Gradually Appeared N/A N/A Wounding Event: Abscess N/A N/A Primary Etiology: Chronic Obstructive Pulmonary N/A N/A Comorbid History: Disease (COPD), Coronary Artery Disease, Hypertension 06/04/2022 N/A N/A Date Acquired: 0 N/A N/A Weeks of Treatment: Open N/A N/A Wound Status: No N/A N/A Wound Recurrence: 0.5x1.5x0.1 N/A N/A Measurements L x W x D (cm) 0.589 N/A N/A A (cm) : rea 0.059 N/A N/A Volume (cm) : Full Thickness Without Exposed N/A N/A Classification: Support Structures Medium N/A N/A Exudate Amount: Serosanguineous N/A N/A Exudate Type: red, brown N/A N/A Exudate Color: Small (1-33%) N/A N/A Granulation Amount: Pink N/A N/A Granulation Quality: Large (67-100%) N/A N/A Necrotic Amount: Fat Layer (Subcutaneous Tissue): Yes N/A N/A Exposed Structures: Fascia: No Tendon:  No Muscle: No Joint: No Bone: No Small (1-33%) N/A N/A Epithelialization: Treatment Notes Jessica, [name] ([id number]) [id number] Page 5 of 9 Electronic Signature(s) Signed: 08/31/2022 3:13:07 PM By: Carlene Coria RN Entered By: Carlene Coria on 08/30/2022 13:46:46 -------------------------------------------------------------------------------- Multi-Disciplinary Care Plan Details Patient Name: Date of Service: Jessica [place], Jessica NE [name]. 08/30/2022 1:30 PM Medical Record Number: [id number] Patient Account Number: [id number] Date of Birth/Sex: Treating RN: 1951/06/10 (72 y.o. [name] Primary Care Kord Monette: Ria Bush Other Clinician: Referring Zella Dewan: Treating Channon Brougher/Extender: Hattie Perch in Treatment: 0 Active Inactive Abuse / Safety / Falls / Self Care Management Nursing Diagnoses: Potential for falls Goals: Patient will remain injury free related to falls Date Initiated: 08/30/2022 Target Resolution Date: 09/30/2022 Goal Status: Active Interventions: Assess Activities of Daily Living upon admission and as needed Assess fall risk on admission and as needed  Assess: immobility, friction, shearing, incontinence upon admission and as needed Assess impairment of mobility on admission and as needed per policy Assess personal safety and home safety (as indicated) on admission and as needed Assess self care needs on admission and as needed Notes: Necrotic Tissue Nursing Diagnoses: Knowledge deficit related to management of necrotic/devitalized tissue Goals: Necrotic/devitalized tissue will be minimized in the wound bed Date Initiated: 08/30/2022 Target Resolution Date: 09/30/2022 Goal Status: Active Patient/caregiver will verbalize understanding of reason and process for debridement of necrotic tissue Date Initiated: 08/30/2022 Target Resolution Date: 09/30/2022 Goal Status: Active Interventions: Assess patient pain  level pre-, during and post procedure and prior to discharge Notes: Wound/Skin Impairment Nursing Diagnoses: Knowledge deficit related to ulceration/compromised skin integrity Goals: Patient/caregiver will verbalize understanding of skin care regimen Date Initiated: 08/30/2022 Target Resolution Date: 09/30/2022 Jessica, [name] ([id number]) [id number] Page 6 of 9 Goal Status: Active Ulcer/skin breakdown will have a volume reduction of 30% by week 4 Date Initiated: 08/30/2022 Target Resolution Date: 09/30/2022 Goal Status: Active Ulcer/skin breakdown will have a volume reduction of 50% by week 8 Date Initiated: 08/30/2022 Target Resolution Date: 10/29/2022 Goal Status: Active Ulcer/skin breakdown will have a volume reduction of 80% by week 12 Date Initiated: 08/30/2022 Target Resolution Date: 11/29/2022 Goal Status: Active Ulcer/skin breakdown will heal within 14 weeks Date Initiated: 08/30/2022 Target Resolution Date: 12/29/2022 Goal Status: Active Interventions: Assess patient/caregiver ability to obtain necessary supplies Assess patient/caregiver ability to perform ulcer/skin care regimen upon admission and as needed Assess ulceration(s) every visit Notes: Electronic Signature(s) Signed: 08/31/2022 3:13:07 PM By: Carlene Coria RN Entered By: Carlene Coria on 08/30/2022 13:29:08 -------------------------------------------------------------------------------- Pain Assessment Details Patient Name: Date of Service: [name], Jessica [place] [name]. 08/30/2022 1:30 PM Medical Record Number: [id number] Patient Account Number: [id number] Date of Birth/Sex: Treating RN: 12-Apr-1951 (72 y.o. [name] Primary Care Johonna Binette: Ria Bush Other Clinician: Referring Tanikka Bresnan: Treating Jacki Couse/Extender: Ree Kida Weeks in Treatment: 0 Active Problems Location of Pain Severity and Description of Pain Patient Has Paino No Site Locations Pain  Management and Medication Current Pain Management: EBONIE, WESTERLUND ([id number]) [id number] Page 7 of 9 Electronic Signature(s) Signed: 08/31/2022 3:13:07 PM By: Carlene Coria RN Entered By: Carlene Coria on 08/30/2022 13:08:47 -------------------------------------------------------------------------------- Patient/Caregiver Education Details Patient Name: Date of Service: [name], Jessica NE [name]. 1/10/2024andnbsp1:30 PM Medical Record Number: [id number] Patient Account Number: [id number] Date of Birth/Gender: Treating RN: 1951-01-03 (72 y.o. [name] Primary Care Physician: Ria Bush Other Clinician: Referring Physician: Treating Physician/Extender: Hattie Perch in Treatment: 0 Education Assessment Education Provided To: Patient Education Topics Provided Welcome T [facility]New Patient Packet: o Methods: Explain/Verbal Responses: State content correctly Wound/Skin Impairment: Methods: Explain/Verbal Responses: State content correctly Electronic Signature(s) Signed: 08/31/2022 3:13:07 PM By: Carlene Coria RN Entered By: Carlene Coria on 08/30/2022 13:25:36 -------------------------------------------------------------------------------- Wound Assessment Details Patient Name: Date of Service: [name], Jessica NE [name]. 08/30/2022 1:30 PM Medical Record Number: [id number] Patient Account Number: [id number] Date of Birth/Sex: Treating RN: Mar 02, 1951 (72 y.o. [name] Primary Care Kanani Mowbray: Ria Bush Other Clinician: Referring Demareon Coldwell: Treating Kayliee Atienza/Extender: Ree Kida Weeks in Treatment: 0 Wound Status Wound Number: 1 Primary Abscess Etiology: Wound Location: Left Breast Wound Open Wounding Event: Gradually Appeared StatusALMA, [name] ([id number]) [id number] Page 8 of 9 Status: Date Acquired: 06/04/2022 Comorbid Chronic Obstructive  Pulmonary Disease (COPD), Coronary Weeks Of Treatment: 0 History: Artery Disease, Hypertension Clustered Wound: No Photos Wound Measurements Length: (cm) 0.5  Width: (cm) 1.5 Depth: (cm) 0.1 Area: (cm) 0.589 Volume: (cm) 0.059 % Reduction in Area: % Reduction in Volume: Epithelialization: Small (1-33%) Tunneling: No Undermining: No Wound Description Classification: Full Thickness Without Exposed Support Structures Exudate Amount: Medium Exudate Type: Serosanguineous Exudate Color: red, brown Foul Odor After Cleansing: No Slough/Fibrino Yes Wound Bed Granulation Amount: Small (1-33%) Exposed Structure Granulation Quality: Pink Fascia Exposed: No Necrotic Amount: Large (67-100%) Fat Layer (Subcutaneous Tissue) Exposed: Yes Necrotic Quality: Adherent Slough Tendon Exposed: No Muscle Exposed: No Joint Exposed: No Bone Exposed: No Treatment Notes Wound #1 (Breast) Wound Laterality: Left Cleanser [place] Ancillary Kit - 15 Day Supply Discharge Instruction: Use supplies as instructed; Kit contains: (15) Saline Bullets; (15) 3x3 Gauze; 15 pr Gloves Peri-Wound Care Topical Primary Dressing Honey: Activon Honey Gel, 25 (g) Tube Hydrofera Blue Ready Transfer Foam, 2.5x2.5 (in/in) Discharge Instruction: Apply Hydrofera Blue Ready to wound bed as directed Secondary Dressing (BORDER) Zetuvit Plus SILICONE BORDER Dressing 4x4 (in/in) Discharge Instruction: Please do not put silicone bordered dressings under wraps. Use non-bordered dressing only. Secured With Compression Wrap Compression Stockings Add-Ons Electronic Signature(s) Signed: 08/31/2022 3:13:07 PM By: Carlene Coria RN Entered By: Carlene Coria on 08/30/2022 13:23:22 [name] ([id number]) [id number] Page 9 of 9 -------------------------------------------------------------------------------- Vitals Details Patient Name: Date of Service: [name], Jessica [place] [name]. 08/30/2022 1:30 PM Medical  Record Number: [id number] Patient Account Number: [id number] Date of Birth/Sex: Treating RN: 07-27-1951 (72 y.o. [name] Primary Care Yoltzin Ransom: Ria Bush Other Clinician: Referring Leonidas Boateng: Treating Zayden Hahne/Extender: Ree Kida Weeks in Treatment: 0 Vital Signs Time Taken: 13:08 Temperature (F): 97.7 Height (in): 62 Pulse (bpm): 51 Source: Stated Respiratory Rate (breaths/min): 16 Weight (lbs): 211 Blood Pressure (mmHg): 138/66 Source: Stated Reference Range: 80 - 120 mg / dl Body Mass Index (BMI): 38.6 Electronic Signature(s) Signed: 08/31/2022 3:13:07 PM By: Carlene Coria RN Entered By: Carlene Coria on 08/30/2022 13:09:14

## 2022-09-01 NOTE — Telephone Encounter (Signed)
Appreciate this thank you!

## 2022-09-02 ENCOUNTER — Other Ambulatory Visit: Payer: Self-pay | Admitting: Family Medicine

## 2022-09-04 ENCOUNTER — Telehealth: Payer: Self-pay | Admitting: Pulmonary Disease

## 2022-09-04 NOTE — Telephone Encounter (Signed)
Patient is aware of below message and voiced her understanding.  She will contact [facility]. I have provided her with contact number. Nothing further needed.

## 2022-09-04 NOTE — Telephone Encounter (Signed)
Gabapentin Last filled: 06/30/22, #200 Last OV: 08/15/22, 3 mo DM f/u Next OV: 11/15/22, CPE

## 2022-09-04 NOTE — Telephone Encounter (Signed)
ATC patient-unable to leave cvm due to mailbox not being setup.   We have not received determination from [facility]. Patient will need to contact [employer].

## 2022-09-06 ENCOUNTER — Encounter (HOSPITAL_BASED_OUTPATIENT_CLINIC_OR_DEPARTMENT_OTHER): Payer: Medicare HMO | Admitting: Internal Medicine

## 2022-09-06 DIAGNOSIS — G4733 Obstructive sleep apnea (adult) (pediatric): Secondary | ICD-10-CM | POA: Diagnosis not present

## 2022-09-06 DIAGNOSIS — S21002A Unspecified open wound of left breast, initial encounter: Secondary | ICD-10-CM

## 2022-09-06 DIAGNOSIS — E11622 Type 2 diabetes mellitus with other skin ulcer: Secondary | ICD-10-CM

## 2022-09-06 DIAGNOSIS — L98492 Non-pressure chronic ulcer of skin of other sites with fat layer exposed: Secondary | ICD-10-CM | POA: Diagnosis not present

## 2022-09-06 DIAGNOSIS — I2510 Atherosclerotic heart disease of native coronary artery without angina pectoris: Secondary | ICD-10-CM | POA: Diagnosis not present

## 2022-09-06 DIAGNOSIS — Z951 Presence of aortocoronary bypass graft: Secondary | ICD-10-CM | POA: Diagnosis not present

## 2022-09-06 DIAGNOSIS — J449 Chronic obstructive pulmonary disease, unspecified: Secondary | ICD-10-CM | POA: Diagnosis not present

## 2022-09-06 DIAGNOSIS — Z87891 Personal history of nicotine dependence: Secondary | ICD-10-CM | POA: Diagnosis not present

## 2022-09-06 DIAGNOSIS — E039 Hypothyroidism, unspecified: Secondary | ICD-10-CM | POA: Diagnosis not present

## 2022-09-06 DIAGNOSIS — X58XXXA Exposure to other specified factors, initial encounter: Secondary | ICD-10-CM | POA: Diagnosis not present

## 2022-09-06 NOTE — Progress Notes (Signed)
RISA, AUMAN ([id number]) [id number] Page 1 of 7 Visit Report for 09/06/2022 Chief Complaint Document Details Patient Name: Date of Service: [name], [name] [name]. 09/06/2022 11:15 A [name] Medical Record Number: [id number] Patient Account Number: [id number] Date of Birth/Sex: Treating [name]: [birth year] (72 y.o. [name] Primary Care Provider: Ria Bush Other Clinician: Referring Provider: Treating Provider/Extender: [name] in Treatment: 1 Information Obtained from: Patient Chief Complaint 08/30/2022; Left breast wound Electronic Signature(s) Signed: 09/06/2022 12:38:47 PM By: Jessica Shan DO Entered By: [name] on 09/06/2022 12:16:46 -------------------------------------------------------------------------------- Debridement Details Patient Name: Date of Service: [name], Jessica NE [name]. 09/06/2022 11:15 A [name] Medical Record Number: [id number] Patient Account Number: [id number] Date of Birth/Sex: Treating [name]: Sep 16, 1950 (72 y.o. [name] Primary Care Provider: Ria Bush Other Clinician: Referring Provider: Treating Provider/Extender: Hattie Perch in Treatment: 1 Debridement Performed for Assessment: Wound #1 Left Breast Performed By: Physician Jessica Shan, [name] Debridement Type: Debridement Level of Consciousness (Pre-procedure): Awake and Alert Pre-procedure Verification/Time Out Yes - 11:35 Taken: Start Time: 11:35 T Area Debrided (L x W): otal 0.3 (cm) x 0.4 (cm) = 0.12 (cm) Tissue and other material debrided: Viable, Non-Viable, Slough, Subcutaneous, Slough Level: Skin/Subcutaneous Tissue Debridement Description: Excisional Instrument: Curette Bleeding: Minimum Hemostasis Achieved: Pressure End Time: 11:39 Procedural Pain: 0 Post Procedural Pain: 0 Response to Treatment: Procedure was tolerated well Level of Consciousness ALONA, DANFORD  ([id number]) [id number] Page 2 of 7 Level of Consciousness (Post- Awake and Alert procedure): Post Debridement Measurements of Total Wound Length: (cm) 0.3 Width: (cm) 0.4 Depth: (cm) 0.1 Volume: (cm) 0.009 Character of Wound/Ulcer Post Debridement: Improved Post Procedure Diagnosis Same as Pre-procedure Electronic Signature(s) Signed: 09/06/2022 12:38:47 PM By: Jessica Shan DO Signed: 09/06/2022 4:29:22 PM By: [name] Entered By: [name] on 09/06/2022 11:40:15 -------------------------------------------------------------------------------- HPI Details Patient Name: Date of Service: [name], Jessica NE [name]. 09/06/2022 11:15 A [name] Medical Record Number: [id number] Patient Account Number: [id number] Date of Birth/Sex: Treating [name]: 1950-09-26 (72 y.o. [name] Primary Care Provider: Ria Bush Other Clinician: Referring Provider: Treating Provider/Extender: [name] in Treatment: 1 History of Present Illness HPI Description: 08/30/2022 Ms. [name] is a 72 year old female with a past medical history of controlled type II by diabetes on oral agents, COPD, CAD s/p CABG x 1, OSA, hypothyroidism and bipolar disorder that presents to the clinic for a one year history of nonhealing wound to the left breast. She states she has a history of wounds to her breasts that start out as boils. She visited her primary care physician for this issue last year on 11/11/2021. She was referred to dermatology at that time. She was precribed mupirocin with little benefit. She currently has 1 wound to her left breast. She denies signs of infection. 1/17; patient presents for follow-up. She found Medihoney infused Band-Aids and has been using this to the wound bed. She has been putting Hydrofera Blue over this however this does not come in contact with the wound bed. She denies signs of infection. She has no issues or complaints  today. Electronic Signature(s) Signed: 09/06/2022 12:38:47 PM By: Jessica Shan DO Entered By: [name] on 09/06/2022 12:17:15 -------------------------------------------------------------------------------- Physical Exam Details Patient Name: Date of Service: [name], Jessica NE [name]. 09/06/2022 11:15 A [name] Medical Record Number: [id number] Patient Account Number: [id number] [name], [name] ([id number]) [phone].pdf Page 3 of 7 Date of Birth/Sex: Treating [name]: May 08, 1951 (72 y.o. F) Epps, Morey Hummingbird  Primary Care Provider: Other Clinician: Ria Bush Referring Provider: Treating Provider/Extender: [name] in Treatment: 1 Constitutional . Psychiatric . Notes Left breast: just medial to the areola there is an open wound with granulation tissue and slough. No induration noted to the periwound. No increased warmth, erythema or purulent drainage. Electronic Signature(s) Signed: 09/06/2022 12:38:47 PM By: Jessica Shan DO Entered By: [name] on 09/06/2022 12:17:33 -------------------------------------------------------------------------------- Physician Orders Details Patient Name: Date of Service: [name], Jessica NE [name]. 09/06/2022 11:15 A [name] Medical Record Number: [id number] Patient Account Number: [id number] Date of Birth/Sex: Treating [name]: 1951/06/29 (72 y.o. [name] Primary Care Provider: Ria Bush Other Clinician: Referring Provider: Treating Provider/Extender: Hattie Perch in Treatment: 1 Verbal / Phone Orders: No Diagnosis Coding Follow-up Appointments Return Appointment in 1 week. Bathing/ Shower/ Hygiene May shower; gently cleanse wound with antibacterial soap, rinse and pat dry prior to dressing wounds - patient to wash body except wound with hibiclens daily times 1 week Anesthetic (Use 'Patient Medications' Section for Anesthetic Order Entry) Lidocaine  applied to wound bed Wound Treatment Wound #1 - Breast Wound Laterality: Left Cleanser: Byram Ancillary Kit - 15 Day Supply (Generic) 1 x Per Day/30 Days Discharge Instructions: Use supplies as instructed; Kit contains: (15) Saline Bullets; (15) 3x3 Gauze; 15 pr Gloves Cleanser: Wound Cleanser 1 x Per Day/30 Days Discharge Instructions: Wash your hands with soap and water. Remove old dressing, discard into plastic bag and place into trash. Cleanse the wound with Wound Cleanser prior to applying a clean dressing using gauze sponges, not tissues or cotton balls. Do not scrub or use excessive force. Pat dry using gauze sponges, not tissue or cotton balls. Prim Dressing: Honey: Activon Honey Gel, 25 (g) Tube ary 1 x Per Day/30 Days Secondary Dressing: (BORDER) Zetuvit Plus SILICONE BORDER Dressing 4x4 (in/in) (Generic) 1 x Per Day/30 Days Discharge Instructions: Please do not put silicone bordered dressings under wraps. Use non-bordered dressing only. Electronic Signature(s) Signed: 09/06/2022 12:38:47 PM By: [name] ([id number]) [id number] Page 4 of 7 Entered By: [name] on 09/06/2022 12:18:52 -------------------------------------------------------------------------------- Problem List Details Patient Name: Date of Service: [place], [name] NE [name]. 09/06/2022 11:15 A [name] Medical Record Number: [id number] Patient Account Number: [id number] Date of Birth/Sex: Treating [name]: [birth year] (72 y.o. [name] Primary Care Provider: Ria Bush Other Clinician: Referring Provider: Treating Provider/Extender: [name] in Treatment: 1 Active Problems ICD-10 Encounter Code Description Active Date MDM Diagnosis S21.002A Unspecified open wound of left breast, initial encounter 08/30/2022 No Yes L98.492 Non-pressure chronic ulcer of skin of other sites with fat layer exposed 08/30/2022 No Yes E11.622 Type  2 diabetes mellitus with other skin ulcer 08/30/2022 No Yes Inactive Problems Resolved Problems Electronic Signature(s) Signed: 09/06/2022 12:38:47 PM By: Jessica Shan DO Entered By: [name] on 09/06/2022 12:16:42 -------------------------------------------------------------------------------- Progress Note Details Patient Name: Date of Service: [name], Jessica NE [name]. 09/06/2022 11:15 A [name] Medical Record Number: [id number] Patient Account Number: [id number] Date of Birth/Sex: Treating [name]: 1951-05-19 (72 y.o. [name] Primary Care Provider: Ria Bush Other Clinician: Referring Provider: Treating Provider/Extender: [name] in Treatment: 1 Subjective [name], [name] ([id number]) [id number] Page 5 of 7 Chief Complaint Information obtained from Patient 08/30/2022; Left breast wound History of Present Illness (HPI) 08/30/2022 Ms. [name] is a 72 year old female with a past medical history of controlled type II by diabetes on oral agents, COPD, CAD s/p CABG  x 1, OSA, hypothyroidism and bipolar disorder that presents to the clinic for a one year history of nonhealing wound to the left breast. She states she has a history of wounds to her breasts that start out as boils. She visited her primary care physician for this issue last year on 11/11/2021. She was referred to dermatology at that time. She was precribed mupirocin with little benefit. She currently has 1 wound to her left breast. She denies signs of infection. 1/17; patient presents for follow-up. She found Medihoney infused Band-Aids and has been using this to the wound bed. She has been putting Hydrofera Blue over this however this does not come in contact with the wound bed. She denies signs of infection. She has no issues or complaints today. Objective Constitutional Vitals Time Taken: 11:16 AM, Height: 62 in, Weight: 211 lbs, BMI: 38.6,  Temperature: 97.9 F, Pulse: 91 bpm, Respiratory Rate: 18 breaths/min, Blood Pressure: 148/83 mmHg. General Notes: Left breast: just medial to the areola there is an open wound with granulation tissue and slough. No induration noted to the periwound. No increased warmth, erythema or purulent drainage. Integumentary (Hair, Skin) Wound #1 status is Open. Original cause of wound was Gradually Appeared. The date acquired was: 06/04/2022. The wound has been in treatment 1 [name]. The wound is located on the Left Breast. The wound measures 0.3cm length x 0.4cm width x 0.1cm depth; 0.094cm^2 area and 0.009cm^3 volume. There is Fat Layer (Subcutaneous Tissue) exposed. There is no tunneling or undermining noted. There is a medium amount of serosanguineous drainage noted. There is small (1-33%) pink granulation within the wound bed. There is a large (67-100%) amount of necrotic tissue within the wound bed including Adherent Slough. Assessment Active Problems ICD-10 Unspecified open wound of left breast, initial encounter Non-pressure chronic ulcer of skin of other sites with fat layer exposed Type 2 diabetes mellitus with other skin ulcer Patient's wound has shown improvement in size and appearance since last clinic visit. I debrided nonviable tissue. She can continue with the Medihoney infused Band-Aids. For now she can stop Hydrofera Blue as this is not helping. Follow-up in 1 week. Procedures Wound #1 Pre-procedure diagnosis of Wound #1 is an Abscess located on the Left Breast . There was a Excisional Skin/Subcutaneous Tissue Debridement with a total area of 0.12 sq cm performed by Jessica Shan, [name]. With the following instrument(s): Curette to remove Viable and Non-Viable tissue/material. Material removed includes Subcutaneous Tissue and Slough and. No specimens were taken. A time out was conducted at 11:35, prior to the start of the procedure. A Minimum amount of bleeding was controlled with  Pressure. The procedure was tolerated well with a pain level of 0 throughout and a pain level of 0 following the procedure. Post Debridement Measurements: 0.3cm length x 0.4cm width x 0.1cm depth; 0.009cm^3 volume. Character of Wound/Ulcer Post Debridement is improved. Post procedure Diagnosis Wound #1: Same as Pre-Procedure Plan Follow-up Appointments: Return Appointment in 1 week. Bathing/ Shower/ Hygiene: May shower; gently cleanse wound with antibacterial soap, rinse and pat dry prior to dressing wounds - patient to wash body except wound with hibiclens daily times 1 week Anesthetic (Use 'Patient Medications' Section for Anesthetic Order Entry): Lidocaine applied to wound bed NETTIE, WYFFELS [name] ([id number]) [id number] Page 6 of 7 WOUND #1: - Breast Wound Laterality: Left Cleanser: Byram Ancillary Kit - 15 Day Supply (Generic) 1 x Per Day/30 Days Discharge Instructions: Use supplies as instructed; Kit contains: (15) Saline Bullets; (15) 3x3 Gauze; 15  pr Gloves Cleanser: Wound Cleanser 1 x Per Day/30 Days Discharge Instructions: Wash your hands with soap and water. Remove old dressing, discard into plastic bag and place into trash. Cleanse the wound with Wound Cleanser prior to applying a clean dressing using gauze sponges, not tissues or cotton balls. Do not scrub or use excessive force. Pat dry using gauze sponges, not tissue or cotton balls. Prim Dressing: Honey: Activon Honey Gel, 25 (g) Tube 1 x Per Day/30 Days ary Secondary Dressing: (BORDER) Zetuvit Plus SILICONE BORDER Dressing 4x4 (in/in) (Generic) 1 x Per Day/30 Days Discharge Instructions: Please do not put silicone bordered dressings under wraps. Use non-bordered dressing only. 1. In office sharp debridement 2. Medihoney 3. Follow-up in 1 week Electronic Signature(s) Signed: 09/06/2022 12:38:47 PM By: Jessica Shan DO Entered By: [name] on 09/06/2022  12:18:30 -------------------------------------------------------------------------------- SuperBill Details Patient Name: Date of Service: [name], Jessica NE [name]. 09/06/2022 Medical Record Number: [id number] Patient Account Number: [id number] Date of Birth/Sex: Treating [name]: 1950-12-29 (72 y.o. [name] Primary Care Provider: Ria Bush Other Clinician: Referring Provider: Treating Provider/Extender: [name] in Treatment: 1 Diagnosis Coding ICD-10 Codes Code Description V78.469G Unspecified open wound of left breast, initial encounter L98.492 Non-pressure chronic ulcer of skin of other sites with fat layer exposed E11.622 Type 2 diabetes mellitus with other skin ulcer Facility Procedures : CPT4 Code: [id number] Description: [place] TISSUE 20 SQ CM/< ICD-10 Diagnosis Description S21.002A Unspecified open wound of left breast, initial encounter L98.492 Non-pressure chronic ulcer of skin of other sites with fat layer exposed E11.622 Type 2 diabetes  mellitus with other skin ulcer Modifier: Quantity: 1 Physician Procedures : CPT4 Code Description Modifier [id number] - WC PHYS SUBQ TISS 20 SQ CM ICD-10 Diagnosis Description S21.002A Unspecified open wound of left breast, initial encounter L98.492 Non-pressure chronic ulcer of skin of other sites with fat layer exposed  E11.622 Type 2 diabetes mellitus with other skin ulcer Quantity: 1 Electronic Signature(s) Signed: 09/06/2022 12:38:47 PM By: Marita Snellen, [name] ([id number]) [id number] Page 7 of 7 Entered By: [name] on 09/06/2022 12:18:44

## 2022-09-06 NOTE — Progress Notes (Signed)
[name] ([id number]) [id number] Page 1 of 9 Visit Report for 09/06/2022 Arrival Information Details Patient Name: Date of Service: [name], [place] M. 09/06/2022 11:15 A M Medical Record Number: [id number] Patient Account Number: [id number] Date of Birth/Sex: Treating RN: Jan 03, 1951 (72 y.o. [name] Primary Care [name]: [name] Other Clinician: Referring [name]: Treating [name]/Extender: [name] in Treatment: 1 Visit Information History Since Last Visit Added or deleted any medications: No Patient Arrived: [name] Any new allergies or adverse reactions: No Arrival Time: 11:11 Had a fall or experienced change in No Accompanied By: self activities of daily living that may affect Transfer Assistance: None risk of falls: Patient Identification Verified: Yes Signs or symptoms of abuse/neglect since last visito No Secondary Verification Process Completed: Yes Hospitalized since last visit: No Patient Requires Transmission-Based Precautions: No Implantable device outside of the clinic excluding No Patient Has Alerts: No cellular tissue based products placed in the center since last visit: Has Dressing in Place as Prescribed: Yes Pain Present Now: No Electronic Signature(s) Signed: 09/06/2022 4:29:22 PM By: Jessica Coria RN Entered By: [name] on 09/06/2022 11:16:49 -------------------------------------------------------------------------------- Clinic Level of Care Assessment Details Patient Name: Date of Service: [name], [place] Jessica M. 09/06/2022 11:15 A M Medical Record Number: [id number] Patient Account Number: [id number] Date of Birth/Sex: Treating RN: 1951/01/04 (72 y.o. [name] Primary Care [name]: [name] Other Clinician: Referring [name]: Treating [name]: [name] in Treatment: 1 Clinic Level of Care Assessment  Items TOOL 1 Quantity Score '[]'$  - 0 Use when EandM and Procedure is performed on INITIAL visit ASSESSMENTS - Nursing Assessment / Reassessment '[]'$  - 0 General Physical Exam (combine w/ comprehensive assessment (listed just below) when performed on new pt. evals) '[]'$  - 0 Comprehensive Assessment (HX, ROS, Risk Assessments, Wounds Hx, etc.) TAMECA, JEREZ ([id number]) [id number] Page 2 of 9 ASSESSMENTS - Wound and Skin Assessment / Reassessment '[]'$  - 0 Dermatologic / Skin Assessment (not related to wound area) ASSESSMENTS - Ostomy and/or Continence Assessment and Care '[]'$  - 0 Incontinence Assessment and Management '[]'$  - 0 Ostomy Care Assessment and Management (repouching, etc.) PROCESS - Coordination of Care '[]'$  - 0 Simple Patient / Family Education for ongoing care '[]'$  - 0 Complex (extensive) Patient / Family Education for ongoing care '[]'$  - 0 Staff obtains Programmer, systems, Records, T Results / Process Orders est '[]'$  - 0 Staff telephones HHA, Nursing Homes / Clarify orders / etc '[]'$  - 0 Routine Transfer to another Facility (non-emergent condition) '[]'$  - 0 Routine Hospital Admission (non-emergent condition) '[]'$  - 0 New Admissions / Biomedical engineer / Ordering NPWT Apligraf, etc. , '[]'$  - 0 Emergency Hospital Admission (emergent condition) PROCESS - Special Needs '[]'$  - 0 Pediatric / Minor Patient Management '[]'$  - 0 Isolation Patient Management '[]'$  - 0 Hearing / Language / Visual special needs '[]'$  - 0 Assessment of Community assistance (transportation, D/C planning, etc.) '[]'$  - 0 Additional assistance / Altered mentation '[]'$  - 0 Support Surface(s) Assessment (bed, cushion, seat, etc.) INTERVENTIONS - Miscellaneous '[]'$  - 0 External ear exam '[]'$  - 0 Patient Transfer (multiple staff / Civil Service fast streamer / Similar devices) '[]'$  - 0 Simple Staple / Suture removal (25 or less) '[]'$  - 0 Complex Staple / Suture removal (26 or more) '[]'$  - 0 Hypo/Hyperglycemic Management (do  not check if billed separately) '[]'$  - 0 Ankle / Brachial Index (ABI) - do not check if billed separately Has the patient been seen at the hospital  within the last three years: Yes Total Score: 0 Level Of Care: ____ Electronic Signature(s) Signed: 09/06/2022 4:29:22 PM By: Jessica Coria RN Entered By: [name] on 09/06/2022 11:49:00 -------------------------------------------------------------------------------- Encounter Discharge Information Details Patient Name: Date of Service: [name], Jessica Jessica M. 09/06/2022 11:15 A M Medical Record Number: [id number] Patient Account Number: [id number] Date of Birth/Sex: Treating RN: 09-30-1950 (72 y.o. [name] Primary Care Shameer Molstad: [name] Other Clinician: Referring Harlynn Kimbell: Treating [name]/Extender: [name] in Treatment: [name], [place] ([id number]) [id number] Page 3 of 9 Encounter Discharge Information Items Post Procedure Vitals Discharge Condition: Stable Temperature (F): 97.9 Ambulatory Status: Ambulatory Pulse (bpm): 91 Discharge Destination: Home Respiratory Rate (breaths/min): 18 Transportation: Private Auto Blood Pressure (mmHg): 148/83 Accompanied By: self Schedule Follow-up Appointment: Yes Clinical Summary of Care: Electronic Signature(s) Signed: 09/06/2022 11:49:48 AM By: Jessica Coria RN Entered By: [name] on 09/06/2022 11:49:47 -------------------------------------------------------------------------------- Lower Extremity Assessment Details Patient Name: Date of Service: [name], Jessica Jessica M. 09/06/2022 11:15 A M Medical Record Number: [id number] Patient Account Number: [id number] Date of Birth/Sex: Treating RN: 1951/07/26 (72 y.o.) [name] Primary Care Zaniel Marineau: [name] Other Clinician: Referring Shyanna Klingel: Treating Osamah Schmader/Extender: [name] in Treatment: 1 Electronic  Signature(s) Signed: 09/06/2022 4:29:22 PM By: Jessica Coria RN Entered By: [name] on 09/06/2022 11:21:01 -------------------------------------------------------------------------------- Multi Wound Chart Details Patient Name: Date of Service: [name], Jessica Jessica M. 09/06/2022 11:15 A M Medical Record Number: [id number] Patient Account Number: [id number] Date of Birth/Sex: Treating RN: 1950/09/27 (72 y.o. [name] Primary Care Shariyah Eland: [name] Other Clinician: Referring Ranier Coach: Treating Lashica Hannay/Extender: [name] in Treatment: 1 Vital Signs Height(in): 62 Pulse(bpm): 91 Weight(lbs): 211 Blood Pressure(mmHg): 148/83 Body Mass Index(BMI): 38.6 Temperature(F): 97.9 Respiratory Rate(breaths/min): 18 [1:Photos:] Bannister, Gracious M ([id number]) [1:Photos:] [N/A:N/A] Left Breast N/A N/A Wound Location: Gradually Appeared N/A N/A Wounding Event: Abscess N/A N/A Primary Etiology: Chronic Obstructive Pulmonary N/A N/A Comorbid History: Disease (COPD), Coronary Artery Disease, Hypertension 06/04/2022 N/A N/A Date Acquired: 1 N/A N/A [name] of Treatment: Open N/A N/A Wound Status: No N/A N/A Wound Recurrence: 0.3x0.4x0.1 N/A N/A Measurements L x W x D (cm) 0.094 N/A N/A A (cm) : rea 0.009 N/A N/A Volume (cm) : 84.00% N/A N/A % Reduction in Area: 84.70% N/A N/A % Reduction in Volume: Full Thickness Without Exposed N/A N/A Classification: Support Structures Medium N/A N/A Exudate Amount: Serosanguineous N/A N/A Exudate Type: red, brown N/A N/A Exudate Color: Small (1-33%) N/A N/A Granulation Amount: Pink N/A N/A Granulation Quality: Large (67-100%) N/A N/A Necrotic Amount: Fat Layer (Subcutaneous Tissue): Yes N/A N/A Exposed Structures: Fascia: No Tendon: No Muscle: No Joint: No Bone: No Small (1-33%) N/A N/A Epithelialization: Treatment Notes Electronic Signature(s) Signed: 09/06/2022 4:29:22 PM By:  Jessica Coria RN Entered By: [name] on 09/06/2022 11:21:05 -------------------------------------------------------------------------------- Multi-Disciplinary Care Plan Details Patient Name: Date of Service: [name], Jessica Jessica M. 09/06/2022 11:15 A M Medical Record Number: [id number] Patient Account Number: [id number] Date of Birth/Sex: Treating RN: 1951/03/04 (72 y.o. [name] Primary Care Avon Mergenthaler: [name] Other Clinician: Referring Kathaleya Mcduffee: Treating Junie Engram/Extender: [name] in Treatment: 1 Active Inactive Abuse / Safety / Falls / Self Care Management Nursing Diagnoses: Potential for falls Jessica, [name] ([id number]) [phone].pdf Page 5 of 9 Goals: Patient will remain injury free related to falls Date Initiated: 08/30/2022 Target Resolution Date: 09/30/2022 Goal Status: Active Interventions: Assess Activities of Daily Living upon admission and  as needed Assess fall risk on admission and as needed Assess: immobility, friction, shearing, incontinence upon admission and as needed Assess impairment of mobility on admission and as needed per policy Assess personal safety and home safety (as indicated) on admission and as needed Assess self care needs on admission and as needed Notes: Necrotic Tissue Nursing Diagnoses: Knowledge deficit related to management of necrotic/devitalized tissue Goals: Necrotic/devitalized tissue will be minimized in the wound bed Date Initiated: 08/30/2022 Target Resolution Date: 09/30/2022 Goal Status: Active Patient/caregiver will verbalize understanding of reason and process for debridement of necrotic tissue Date Initiated: 08/30/2022 Target Resolution Date: 09/30/2022 Goal Status: Active Interventions: Assess patient pain level pre-, during and post procedure and prior to discharge Notes: Wound/Skin Impairment Nursing Diagnoses: Knowledge deficit related to  ulceration/compromised skin integrity Goals: Patient/caregiver will verbalize understanding of skin care regimen Date Initiated: 08/30/2022 Target Resolution Date: 09/30/2022 Goal Status: Active Ulcer/skin breakdown will have a volume reduction of 30% by week 4 Date Initiated: 08/30/2022 Target Resolution Date: 09/30/2022 Goal Status: Active Ulcer/skin breakdown will have a volume reduction of 50% by week 8 Date Initiated: 08/30/2022 Target Resolution Date: 10/29/2022 Goal Status: Active Ulcer/skin breakdown will have a volume reduction of 80% by week 12 Date Initiated: 08/30/2022 Target Resolution Date: 11/29/2022 Goal Status: Active Ulcer/skin breakdown will heal within 14 [name] Date Initiated: 08/30/2022 Target Resolution Date: 12/29/2022 Goal Status: Active Interventions: Assess patient/caregiver ability to obtain necessary supplies Assess patient/caregiver ability to perform ulcer/skin care regimen upon admission and as needed Assess ulceration(s) every visit Notes: Electronic Signature(s) Signed: 09/06/2022 4:29:22 PM By: Jessica Coria RN Entered By: [name] on 09/06/2022 11:21:34 [name] ([id number]) [id number] Page 6 of 9 -------------------------------------------------------------------------------- Pain Assessment Details Patient Name: Date of Service: [name], [place] Jessica M. 09/06/2022 11:15 A M Medical Record Number: [id number] Patient Account Number: [id number] Date of Birth/Sex: Treating RN: 10/23/1950 (72 y.o. [name] Primary Care Ambika Zettlemoyer: [name] Other Clinician: Referring Autumne Kallio: Treating Mecca Barga/Extender: [name] in Treatment: 1 Active Problems Location of Pain Severity and Description of Pain Patient Has Paino No Site Locations Pain Management and Medication Current Pain Management: Electronic Signature(s) Signed: 09/06/2022 4:29:22 PM By: Jessica Coria RN Entered By: [name] on 09/06/2022 11:17:16 -------------------------------------------------------------------------------- Patient/Caregiver Education Details Patient Name: Date of Service: [name], Jessica Jessica M. 1/17/2024andnbsp11:15 A M Medical Record Number: [id number] Patient Account Number: [id number] Date of Birth/Gender: Treating RN: [birth year] (72 y.o. [name] Primary Care Physician: [name] Other Clinician: Referring Physician: Treating Physician/Extender: [name] in Treatment: [place], [place] ([id number]) [id number] Page 7 of 9 Education Assessment Education Provided To: Patient Education Topics Provided Wound/Skin Impairment: Methods: Explain/Verbal Responses: State content correctly Electronic Signature(s) Signed: 09/06/2022 4:29:22 PM By: Jessica Coria RN Entered By: [name] on 09/06/2022 11:21:21 -------------------------------------------------------------------------------- Wound Assessment Details Patient Name: Date of Service: [name], Jessica Jessica M. 09/06/2022 11:15 A M Medical Record Number: [id number] Patient Account Number: [id number] Date of Birth/Sex: Treating RN: 1951-02-19 (72 y.o. [name] Primary Care Erykah Lippert: [name] Other Clinician: Referring Harlin Mazzoni: Treating Ivianna Notch/Extender: [name] in Treatment: 1 Wound Status Wound Number: 1 Primary Abscess Etiology: Wound Location: Left Breast Wound Open Wounding Event: Gradually Appeared Status: Date Acquired: 06/04/2022 Comorbid Chronic Obstructive Pulmonary Disease (COPD), Coronary [name] Of Treatment: 1 History: Artery Disease, Hypertension Clustered Wound: No Photos Wound Measurements Length: (cm) 0.3 Width: (cm) 0.4 Depth: (cm) 0.1 Area: (cm) 0.094 Volume: (cm) 0.009 %  Reduction in Area: 84% % Reduction in Volume: 84.7% Epithelialization: Small (1-33%) Tunneling:  No Undermining: No Wound Description Classification: Full Thickness Without Exposed Support Exudate Amount: Medium Exudate Type: Serosanguineous Exudate Color: red, brown Jessica, [name] ([id number]) Structures Foul Odor After Cleansing: No Slough/Fibrino Yes [phone].pdf Page 8 of 9 Wound Bed Granulation Amount: Small (1-33%) Exposed Structure Granulation Quality: Pink Fascia Exposed: No Necrotic Amount: Large (67-100%) Fat Layer (Subcutaneous Tissue) Exposed: Yes Necrotic Quality: Adherent Slough Tendon Exposed: No Muscle Exposed: No Joint Exposed: No Bone Exposed: No Treatment Notes Wound #1 (Breast) Wound Laterality: Left Cleanser [place] Ancillary Kit - 15 Day Supply Discharge Instruction: Use supplies as instructed; Kit contains: (15) Saline Bullets; (15) 3x3 Gauze; 15 pr Gloves Wound Cleanser Discharge Instruction: Wash your hands with soap and water. Remove old dressing, discard into plastic bag and place into trash. Cleanse the wound with Wound Cleanser prior to applying a clean dressing using gauze sponges, not tissues or cotton balls. Do not scrub or use excessive force. Pat dry using gauze sponges, not tissue or cotton balls. Peri-Wound Care Topical Primary Dressing Honey: Activon Honey Gel, 25 (g) Tube Secondary Dressing (BORDER) Zetuvit Plus SILICONE BORDER Dressing 4x4 (in/in) Discharge Instruction: Please do not put silicone bordered dressings under wraps. Use non-bordered dressing only. Secured With Compression Wrap Compression Stockings Add-Ons Electronic Signature(s) Signed: 09/06/2022 4:29:22 PM By: Jessica Coria RN Entered By: [name] on 09/06/2022 11:20:49 -------------------------------------------------------------------------------- Vitals Details Patient Name: Date of Service: [name], Jessica Jessica M. 09/06/2022 11:15 A M Medical Record Number: [id number] Patient Account Number: [id number] Date of Birth/Sex: Treating  RN: Nov 08, 1950 (72 y.o. [name] Primary Care Josepha Barbier: [name] Other Clinician: Referring Ermin Parisien: Treating Emmitt Matthews/Extender: [name] in Treatment: 1 Vital Signs Time Taken: 11:16 Temperature (F): 97.9 Height (in): 62 Pulse (bpm): 91 Weight (lbs): 211 Respiratory Rate (breaths/min): 18 Body Mass Index (BMI): 38.6 Blood Pressure (mmHg): 148/83 Reference Range: 80 - 120 mg / dl Jessica, [name] ([id number]) [phone].pdf Page 9 of 9 Electronic Signature(s) Signed: 09/06/2022 4:29:22 PM By: Jessica Coria RN Entered By: [name] on 09/06/2022 11:17:08

## 2022-09-13 ENCOUNTER — Ambulatory Visit: Payer: Medicare HMO | Admitting: Internal Medicine

## 2022-09-20 ENCOUNTER — Encounter: Payer: Self-pay | Admitting: Family Medicine

## 2022-09-20 ENCOUNTER — Encounter (HOSPITAL_BASED_OUTPATIENT_CLINIC_OR_DEPARTMENT_OTHER): Payer: Medicare HMO | Admitting: Internal Medicine

## 2022-09-20 DIAGNOSIS — S21002A Unspecified open wound of left breast, initial encounter: Secondary | ICD-10-CM

## 2022-09-20 DIAGNOSIS — G4733 Obstructive sleep apnea (adult) (pediatric): Secondary | ICD-10-CM | POA: Diagnosis not present

## 2022-09-20 DIAGNOSIS — L98492 Non-pressure chronic ulcer of skin of other sites with fat layer exposed: Secondary | ICD-10-CM

## 2022-09-20 DIAGNOSIS — Z951 Presence of aortocoronary bypass graft: Secondary | ICD-10-CM | POA: Diagnosis not present

## 2022-09-20 DIAGNOSIS — E039 Hypothyroidism, unspecified: Secondary | ICD-10-CM | POA: Diagnosis not present

## 2022-09-20 DIAGNOSIS — Z87891 Personal history of nicotine dependence: Secondary | ICD-10-CM | POA: Diagnosis not present

## 2022-09-20 DIAGNOSIS — E11622 Type 2 diabetes mellitus with other skin ulcer: Secondary | ICD-10-CM

## 2022-09-20 DIAGNOSIS — X58XXXA Exposure to other specified factors, initial encounter: Secondary | ICD-10-CM | POA: Diagnosis not present

## 2022-09-20 DIAGNOSIS — I2510 Atherosclerotic heart disease of native coronary artery without angina pectoris: Secondary | ICD-10-CM | POA: Diagnosis not present

## 2022-09-20 DIAGNOSIS — J449 Chronic obstructive pulmonary disease, unspecified: Secondary | ICD-10-CM | POA: Diagnosis not present

## 2022-09-20 NOTE — Progress Notes (Addendum)
ANIS, CINELLI ([id number]) [id number] Page 1 of 5 Visit Report for 09/20/2022 Chief Complaint Document Details Patient Name: Date of Service: [name], [place] Jessica M. 09/20/2022 2:45 PM Medical Record Number: [id number] Patient Account Number: [id number] Date of Birth/Sex: Treating RN: [birth year] (72 y.o. [name] Primary Care Provider: Ria [name] Other Clinician: Massie [name] Referring Provider: Treating Provider/Extender: [name] in [name]: 3 Information Obtained from: Patient Chief Complaint 08/30/2022; Left breast wound Electronic Signature(s) Signed: 09/20/2022 3:01:12 PM By: Jessica Shan DO Entered By: [name] on 09/20/2022 14:57:28 -------------------------------------------------------------------------------- HPI Details Patient Name: Date of Service: [place] [name], [place] M. 09/20/2022 2:45 PM Medical Record Number: [id number] Patient Account Number: [id number] Date of Birth/Sex: Treating RN: June 23, 1951 (72 y.o. [name] Primary Care Provider: Ria [name] Other Clinician: Massie [name] Referring Provider: Treating Provider/Extender: [name] in [name]: 3 History of Present Illness HPI Description: 08/30/2022 Ms. [name] is a 72 year old female with a past medical history of controlled type II by diabetes on oral agents, COPD, CAD s/p CABG x 1, OSA, hypothyroidism and bipolar disorder that presents to the clinic for a one year history of nonhealing wound to the left breast. She states she has a history of wounds to her breasts that start out as boils. She visited her primary care physician for this issue last year on 11/11/2021. She was referred to dermatology at that time. She was precribed mupirocin with little benefit. She currently has 1 wound to her left breast. She denies signs of infection. 1/17; patient presents for follow-up. She found  Medihoney infused Band-Aids and has been using this to the wound bed. She has been putting Hydrofera Blue over this however this does not come in contact with the wound bed. She denies signs of infection. She has no issues or complaints today. 1/31; patient presents for follow-up. She has been using Medihoney to the wound bed. She has done well with this. Her wound is healed. Electronic Signature(s) Signed: 09/20/2022 3:01:12 PM By: Jessica Shan DO Entered By: [name] on 09/20/2022 14:57:49 [name] ([id number]) [id number] Page 2 of 5 -------------------------------------------------------------------------------- Physical Exam Details Patient Name: Date of Service: [place], [place] NE M. 09/20/2022 2:45 PM Medical Record Number: [id number] Patient Account Number: [id number] Date of Birth/Sex: Treating RN: 1950/12/26 (72 y.o. [name], [name] Primary Care Provider: Ria [name] Other Clinician: Massie [name] Referring Provider: Treating Provider/Extender: [name] in [name]: 3 Constitutional . Psychiatric . Notes Left breast: Just medial to the areola there is epithelization to the previous wound site. Electronic Signature(s) Signed: 09/20/2022 3:01:12 PM By: Jessica Shan DO Entered By: [name] on 09/20/2022 14:58:09 -------------------------------------------------------------------------------- Physician Orders Details Patient Name: Date of Service: [place] [name], [place] M. 09/20/2022 2:45 PM Medical Record Number: [id number] Patient Account Number: [id number] Date of Birth/Sex: Treating RN: [birth year] (72 y.o. [name] Primary Care Provider: Ria [name] Other Clinician: Massie [name] Referring Provider: Treating Provider/Extender: [name]: 3 Verbal / Phone Orders: No Diagnosis Coding ICD-10 Coding Code Description S21.002A  Unspecified open wound of left breast, initial encounter L98.492 Non-pressure chronic ulcer of skin of other sites with fat layer exposed E11.622 Type 2 diabetes mellitus with other skin ulcer Discharge From [facility] Discharge from [place]! Your wound has healed Please call if any further issues arise Electronic Signature(s) BLUMA, BURESH ([id number]) [id number] Page 3 of 5 Signed: 09/20/2022 4:26:21  PM By: Jessica Shan DO Signed: 09/20/2022 4:51:29 PM By: [name] Previous Signature: 09/20/2022 3:01:12 PM Version By: Jessica Shan DO Entered By: [name] on 09/20/2022 15:08:49 -------------------------------------------------------------------------------- Problem List Details Patient Name: Date of Service: [place] [name], [place] M. 09/20/2022 2:45 PM Medical Record Number: [id number] Patient Account Number: [id number] Date of Birth/Sex: Treating RN: [birth year] (72 y.o. [name], [name] Primary Care Provider: Ria [name] Other Clinician: Massie [name] Referring Provider: Treating Provider/Extender: [name]: 3 Active Problems ICD-10 Encounter Code Description Active Date MDM Diagnosis S21.002A Unspecified open wound of left breast, initial encounter 08/30/2022 No Yes L98.492 Non-pressure chronic ulcer of skin of other sites with fat layer exposed 08/30/2022 No Yes E11.622 Type 2 diabetes mellitus with other skin ulcer 08/30/2022 No Yes Inactive Problems Resolved Problems Electronic Signature(s) Signed: 09/20/2022 3:01:12 PM By: Jessica Shan DO Entered By: [name] on 09/20/2022 14:57:18 -------------------------------------------------------------------------------- Progress Note Details Patient Name: Date of Service: [place] [name], [place] M. 09/20/2022 2:45 PM Medical Record Number: [id number] Patient Account Number: [id number] Date of  Birth/Sex: Treating RN: [birth year] (72 y.o. [name] Primary Care Provider: Ria [name] Other Clinician: Massie [name] Referring Provider: Treating Provider/Extender: [name], [place] [name] ([id number]) [id number] Page 4 of 5 [name] in [name]: 3 Subjective Chief Complaint Information obtained from Patient 08/30/2022; Left breast wound History of Present Illness (HPI) 08/30/2022 Ms. [name] is a 72 year old female with a past medical history of controlled type II by diabetes on oral agents, COPD, CAD s/p CABG x 1, OSA, hypothyroidism and bipolar disorder that presents to the clinic for a one year history of nonhealing wound to the left breast. She states she has a history of wounds to her breasts that start out as boils. She visited her primary care physician for this issue last year on 11/11/2021. She was referred to dermatology at that time. She was precribed mupirocin with little benefit. She currently has 1 wound to her left breast. She denies signs of infection. 1/17; patient presents for follow-up. She found Medihoney infused Band-Aids and has been using this to the wound bed. She has been putting Hydrofera Blue over this however this does not come in contact with the wound bed. She denies signs of infection. She has no issues or complaints today. 1/31; patient presents for follow-up. She has been using Medihoney to the wound bed. She has done well with this. Her wound is healed. Objective Constitutional Vitals Time Taken: 2:43 PM, Height: 62 in, Weight: 211 lbs, BMI: 38.6, Temperature: 98.3 F, Pulse: 96 bpm, Respiratory Rate: 18 breaths/min, Blood Pressure: 133/83 mmHg. General Notes: Left breast: Just medial to the areola there is epithelization to the previous wound site. Integumentary (Hair, Skin) Wound #1 status is Healed - Epithelialized. Original cause of wound was Gradually Appeared. The date acquired  was: 06/04/2022. The wound has been in [name] 3 [name]. The wound is located on the Left Breast. The wound measures 0cm length x 0cm width x 0cm depth; 0cm^2 area and 0cm^3 volume. There is Fat Layer (Subcutaneous Tissue) exposed. There is a none present amount of drainage noted. There is no granulation within the wound bed. There is a large (67-100%) amount of necrotic tissue within the wound bed. Assessment Active Problems ICD-10 Unspecified open wound of left breast, initial encounter Non-pressure chronic ulcer of skin of other sites with fat layer exposed Type 2 diabetes mellitus with other skin ulcer Patient has done well with Medihoney. Her wound  has closed. Nothing further to do. She may follow-up as needed. Plan 1. Discharge from clinic due to closed wound 2. Follow-up as needed Electronic Signature(s) Signed: 09/20/2022 3:01:12 PM By: Jessica Shan DO Entered By: [name] on 09/20/2022 15:00:00 [name] ([id number]) [id number] Page 5 of 5 -------------------------------------------------------------------------------- SuperBill Details Patient Name: Date of Service: MA [place], [place] Jessica M. 09/20/2022 Medical Record Number: [id number] Patient Account Number: [id number] Date of Birth/Sex: Treating RN: 11-21-1950 (72 y.o. [name], [name] Primary Care Provider: Ria [name] Other Clinician: Massie [name] Referring Provider: Treating Provider/Extender: [name] in [name]: 3 Diagnosis Coding ICD-10 Codes Code Description S21.002A Unspecified open wound of left breast, initial encounter L98.492 Non-pressure chronic ulcer of skin of other sites with fat layer exposed E11.622 Type 2 diabetes mellitus with other skin ulcer Facility Procedures : CPT4 Code: [id number] Description: [phone] - WOUND CARE VISIT-LEV 2 EST PT Modifier: Quantity: 1 Physician Procedures : CPT4 Code Description Modifier  [id number] - WC PHYS LEVEL 3 - EST PT ICD-10 Diagnosis Description S21.002A Unspecified open wound of left breast, initial encounter L98.492 Non-pressure chronic ulcer of skin of other sites with fat layer exposed  E11.622 Type 2 diabetes mellitus with other skin ulcer Quantity: 1 Electronic Signature(s) Signed: 09/20/2022 4:26:21 PM By: Jessica Shan DO Signed: 09/20/2022 4:51:29 PM By: [name] Previous Signature: 09/20/2022 3:01:12 PM Version By: Jessica Shan DO Entered By: [name] on 09/20/2022 15:10:23

## 2022-09-21 ENCOUNTER — Telehealth: Payer: Self-pay

## 2022-09-21 NOTE — Telephone Encounter (Signed)
Received pt's med shipment of Ozempic 0.25 mg/0.5 mg (5 boxes total).  Spoke with pt notifying her the Ozempic shipment is ready to pick up.  [Placed Ozempic (5 boxes total) in 2nd refrigerator, 1st shelf.)

## 2022-09-21 NOTE — Telephone Encounter (Signed)
Noted  

## 2022-09-21 NOTE — Progress Notes (Signed)
Jessica, [name] ([id number]) [id number] Page 1 of 8 Visit Report for 09/20/2022 Arrival Information Details Patient Name: Date of Service: [name], Jessica [place] M. 09/20/2022 2:45 PM Medical Record Number: [id number] Patient Account Number: [id number] Date of Birth/Sex: Treating RN: 09-Sep-1950 (72 y.o. [name], Kim Primary Care Kymari Nuon: Ria Bush Other Clinician: Massie Kluver Referring [name]: Treating [name]/Extender: Hattie Perch in Treatment: 3 Visit Information History Since Last Visit All ordered tests and consults were completed: No Patient Arrived: [name] Added or deleted any medications: No Arrival Time: 14:38 Any new allergies or adverse reactions: No Transfer Assistance: None Had a fall or experienced change in No Patient Identification Verified: Yes activities of daily living that may affect Secondary Verification Process Completed: Yes risk of falls: Patient Requires Transmission-Based Precautions: No Signs or symptoms of abuse/neglect since last visito No Patient Has Alerts: No Hospitalized since last visit: No Implantable device outside of the clinic excluding No cellular tissue based products placed in the center since last visit: Has Dressing in Place as Prescribed: Yes Pain Present Now: No Electronic Signature(s) Signed: 09/20/2022 4:51:29 PM By: Massie Kluver Entered By: Massie Kluver on 09/20/2022 14:43:15 -------------------------------------------------------------------------------- Clinic Level of Care Assessment Details Patient Name: Date of Service: MA [place], Jessica [place] M. 09/20/2022 2:45 PM Medical Record Number: [id number] Patient Account Number: [id number] Date of Birth/Sex: Treating RN: 06-17-1951 (72 y.o. [name] Primary Care Jamar Casagrande: Ria Bush Other Clinician: Massie Kluver Referring Lamaya Hyneman: Treating [name]/Extender: Hattie Perch in  Treatment: 3 Clinic Level of Care Assessment Items TOOL 4 Quantity Score '[]'$  - 0 Use when only an EandM is performed on FOLLOW-UP visit ASSESSMENTS - Nursing Assessment / Reassessment X- 1 10 Reassessment of Co-morbidities (includes updates in patient status) X- 1 5 Reassessment of Adherence to Treatment Plan CHERIDAN, Jessica ([id number]) [id number] Page 2 of 8 ASSESSMENTS - Wound and Skin A ssessment / Reassessment X - Simple Wound Assessment / Reassessment - one wound 1 5 '[]'$  - 0 Complex Wound Assessment / Reassessment - multiple wounds '[]'$  - 0 Dermatologic / Skin Assessment (not related to wound area) ASSESSMENTS - Focused Assessment '[]'$  - 0 Circumferential Edema Measurements - multi extremities '[]'$  - 0 Nutritional Assessment / Counseling / Intervention '[]'$  - 0 Lower Extremity Assessment (monofilament, tuning fork, pulses) '[]'$  - 0 Peripheral Arterial Disease Assessment (using hand held doppler) ASSESSMENTS - Ostomy and/or Continence Assessment and Care '[]'$  - 0 Incontinence Assessment and Management '[]'$  - 0 Ostomy Care Assessment and Management (repouching, etc.) PROCESS - Coordination of Care X - Simple Patient / Family Education for ongoing care 1 15 '[]'$  - 0 Complex (extensive) Patient / Family Education for ongoing care '[]'$  - 0 Staff obtains Programmer, systems, Records, T Results / Process Orders est '[]'$  - 0 Staff telephones HHA, Nursing Homes / Clarify orders / etc '[]'$  - 0 Routine Transfer to another Facility (non-emergent condition) '[]'$  - 0 Routine Hospital Admission (non-emergent condition) '[]'$  - 0 New Admissions / Biomedical engineer / Ordering NPWT Apligraf, etc. , '[]'$  - 0 Emergency Hospital Admission (emergent condition) X- 1 10 Simple Discharge Coordination '[]'$  - 0 Complex (extensive) Discharge Coordination PROCESS - Special Needs '[]'$  - 0 Pediatric / Minor Patient Management '[]'$  - 0 Isolation Patient Management '[]'$  - 0 Hearing / Language /  Visual special needs '[]'$  - 0 Assessment of Community assistance (transportation, D/C planning, etc.) '[]'$  - 0 Additional assistance / Altered mentation '[]'$  - 0 Support Surface(s) Assessment (bed, cushion, seat, etc.) INTERVENTIONS -  Wound Cleansing / Measurement X - Simple Wound Cleansing - one wound 1 5 '[]'$  - 0 Complex Wound Cleansing - multiple wounds X- 1 5 Wound Imaging (photographs - any number of wounds) '[]'$  - 0 Wound Tracing (instead of photographs) '[]'$  - 0 Simple Wound Measurement - one wound '[]'$  - 0 Complex Wound Measurement - multiple wounds INTERVENTIONS - Wound Dressings X - Small Wound Dressing one or multiple wounds 1 10 '[]'$  - 0 Medium Wound Dressing one or multiple wounds '[]'$  - 0 Large Wound Dressing one or multiple wounds X- 1 5 Application of Medications - topical '[]'$  - 0 Application of Medications - injection INTERVENTIONS - Miscellaneous '[]'$  - 0 External ear exam Jessica, [name] ([id number]) [id number] Page 3 of 8 '[]'$  - 0 Specimen Collection (cultures, biopsies, blood, body fluids, etc.) '[]'$  - 0 Specimen(s) / Culture(s) sent or taken to Lab for analysis '[]'$  - 0 Patient Transfer (multiple staff / Harrel Lemon Lift / Similar devices) '[]'$  - 0 Simple Staple / Suture removal (25 or less) '[]'$  - 0 Complex Staple / Suture removal (26 or more) '[]'$  - 0 Hypo / Hyperglycemic Management (close monitor of Blood Glucose) '[]'$  - 0 Ankle / Brachial Index (ABI) - do not check if billed separately X- 1 5 Vital Signs Has the patient been seen at the hospital within the last three years: Yes Total Score: 75 Level Of Care: New/Established - Level 2 Electronic Signature(s) Signed: 09/20/2022 4:51:29 PM By: Massie Kluver Entered By: Massie Kluver on 09/20/2022 15:10:07 -------------------------------------------------------------------------------- Encounter Discharge Information Details Patient Name: Date of Service: MA [name], DIA NE M. 09/20/2022 2:45  PM Medical Record Number: [id number] Patient Account Number: [id number] Date of Birth/Sex: Treating RN: 1951/05/07 (72 y.o. [name], Kim Primary Care Aycen Porreca: Ria Bush Other Clinician: Massie Kluver Referring Leilah Polimeni: Treating [name] Aldaco/Extender: Hattie Perch in Treatment: 3 Encounter Discharge Information Items Discharge Condition: Stable Ambulatory Status: Walker Discharge Destination: Home Transportation: Private Auto Accompanied By: self Schedule Follow-up Appointment: Yes Clinical Summary of Care: Electronic Signature(s) Signed: 09/20/2022 4:51:29 PM By: Massie Kluver Entered By: Massie Kluver on 09/20/2022 15:12:14 Lower Extremity Assessment Details -------------------------------------------------------------------------------- Marina Goodell ([id number]) [id number] Page 4 of 8 Patient Name: Date of Service: MA [place], Jessica [place] M. 09/20/2022 2:45 PM Medical Record Number: [id number] Patient Account Number: [id number] Date of Birth/Sex: Treating RN: 1950/09/19 (72 y.o. [name] Primary Care Mylani Gentry: Ria Bush Other Clinician: Massie Kluver Referring Skyllar Notarianni: Treating Anant Agard/Extender: Ree Kida Weeks in Treatment: 3 Electronic Signature(s) Signed: 09/20/2022 4:51:29 PM By: Massie Kluver Signed: 09/20/2022 7:35:08 PM By: Gretta Cool BSN, RN, CWS, Kim RN, BSN Entered By: Massie Kluver on 09/20/2022 14:52:22 -------------------------------------------------------------------------------- Multi Wound Chart Details Patient Name: Date of Service: MA [name], DIA NE M. 09/20/2022 2:45 PM Medical Record Number: [id number] Patient Account Number: [id number] Date of Birth/Sex: Treating RN: [birth year] (72 y.o. [name], Kim Primary Care Roxana Lai: Ria Bush Other Clinician: Massie Kluver Referring Georgie Eduardo: Treating Rolande Moe/Extender: Ree Kida Weeks in Treatment: 3 Vital Signs Height(in): 61 Pulse(bpm): 22 Weight(lbs): 211 Blood Pressure(mmHg): 133/83 Body Mass Index(BMI): 38.6 Temperature(F): 98.3 Respiratory Rate(breaths/min): 18 [1:Photos:] [N/A:N/A] Left Breast N/A N/A Wound Location: Gradually Appeared N/A N/A Wounding Event: Abscess N/A N/A Primary Etiology: Chronic Obstructive Pulmonary N/A N/A Comorbid History: Disease (COPD), Coronary Artery Disease, Hypertension 06/04/2022 N/A N/A Date Acquired: 3 N/A N/A Weeks of Treatment: Healed - Epithelialized N/A N/A Wound Status: No N/A N/A Wound Recurrence: 0x0x0 N/A N/A Measurements L x W x D (  cm) 0 N/A N/A A (cm) : rea 0 N/A N/A Volume (cm) : 100.00% N/A N/A % Reduction in Area: 100.00% N/A N/A % Reduction in Volume: Full Thickness Without Exposed N/A N/A Classification: Support Structures None Present N/A N/A Exudate Amount: None Present (0%) N/A N/A Granulation Amount: Large (67-100%) N/A N/A Necrotic Amount: Fat Layer (Subcutaneous Tissue): Yes N/A N/A Exposed Structures: Fascia: No Tendon: No Muscle: No GLENIS, MUSOLF ([id number]) [id number] Page 5 of 8 Joint: No Bone: No Large (67-100%) N/A N/A Epithelialization: Treatment Notes Electronic Signature(s) Signed: 09/20/2022 4:51:29 PM By: Massie Kluver Entered By: Massie Kluver on 09/20/2022 15:04:49 -------------------------------------------------------------------------------- [place] Details Patient Name: Date of Service: MA [name], DIA NE M. 09/20/2022 2:45 PM Medical Record Number: [id number] Patient Account Number: [id number] Date of Birth/Sex: Treating RN: 1950/09/17 (72 y.o. [name] Primary Care Zamar Odwyer: Ria Bush Other Clinician: Massie Kluver Referring Gavinn Collard: Treating Garreth Burnsworth/Extender: Ree Kida Weeks in Treatment: 3 Active Inactive Electronic  Signature(s) Signed: 09/20/2022 4:51:29 PM By: Massie Kluver Signed: 09/20/2022 7:35:08 PM By: Gretta Cool BSN, RN, CWS, Kim RN, BSN Entered By: Massie Kluver on 09/20/2022 15:11:17 -------------------------------------------------------------------------------- Pain Assessment Details Patient Name: Date of Service: MA [name], DIA [place] M. 09/20/2022 2:45 PM Medical Record Number: [id number] Patient Account Number: [id number] Date of Birth/Sex: Treating RN: 08/13/1951 (72 y.o. [name], Kim Primary Care Billie Trager: Ria Bush Other Clinician: Massie Kluver Referring Mikhala Kenan: Treating Lizzette Carbonell/Extender: Ree Kida Weeks in Treatment: 3 Active Problems Location of Pain Severity and Description of Pain Patient Has Paino No Site Locations [place], [place] [name] ([id number]) [phone].pdf Page 6 of 8 Pain Management and Medication Current Pain Management: Electronic Signature(s) Signed: 09/20/2022 4:51:29 PM By: Massie Kluver Signed: 09/20/2022 7:35:08 PM By: Gretta Cool, BSN, RN, CWS, Kim RN, BSN Entered By: Massie Kluver on 09/20/2022 14:46:16 -------------------------------------------------------------------------------- Patient/Caregiver Education Details Patient Name: Date of Service: MA Lucrezia [place] M. 1/31/2024andnbsp2:45 PM Medical Record Number: [id number] Patient Account Number: [id number] Date of Birth/Gender: Treating RN: Sep 22, 1950 (72 y.o. [name] Primary Care Physician: Ria Bush Other Clinician: Massie Kluver Referring Physician: Treating Physician/Extender: Hattie Perch in Treatment: 3 Education Assessment Education Provided To: Patient Education Topics Provided Wound/Skin Impairment: Handouts: Other: Your wound has healed. Please call if any further issues arise Methods: Explain/Verbal Responses: State content correctly Electronic Signature(s) Signed: 09/20/2022 4:51:29 PM By:  Massie Kluver Entered By: Massie Kluver on 09/20/2022 15:10:51 Marina Goodell ([id number]) [id number] Page 7 of 8 -------------------------------------------------------------------------------- Wound Assessment Details Patient Name: Date of Service: MA [place], Jessica [place] M. 09/20/2022 2:45 PM Medical Record Number: [id number] Patient Account Number: [id number] Date of Birth/Sex: Treating RN: [birth year] (72 y.o. [name], Kim Primary Care Rose Hippler: Ria Bush Other Clinician: Massie Kluver Referring Lamyah Creed: Treating Dania Marsan/Extender: Ree Kida Weeks in Treatment: 3 Wound Status Wound Number: 1 Primary Abscess Etiology: Wound Location: Left Breast Wound Healed - Epithelialized Wounding Event: Gradually Appeared Status: Date Acquired: 06/04/2022 Comorbid Chronic Obstructive Pulmonary Disease (COPD), Coronary Weeks Of Treatment: 3 History: Artery Disease, Hypertension Clustered Wound: No Photos Wound Measurements Length: (cm) Width: (cm) Depth: (cm) Area: (cm) Volume: (cm) 0 % Reduction in Area: 100% 0 % Reduction in Volume: 100% 0 Epithelialization: Large (67-100%) 0 0 Wound Description Classification: Full Thickness Without Exposed Support Exudate Amount: None Present Structures Foul Odor After Cleansing: No Slough/Fibrino No Wound Bed Granulation Amount: None Present (0%) Exposed Structure Necrotic Amount: Large (67-100%) Fascia Exposed: No Fat Layer (Subcutaneous Tissue) Exposed: Yes Tendon Exposed: No  Muscle Exposed: No Joint Exposed: No Bone Exposed: No Treatment Notes Wound #1 (Breast) Wound Laterality: Left Cleanser Peri-Wound Care Topical Primary Dressing CHRISTIANN, HAGERTY ([id number]) [id number].pdf Page 8 of 8 Secondary Dressing Secured With Compression Wrap Compression Stockings Add-Ons Electronic Signature(s) Signed: 09/20/2022 4:51:29 PM By: Massie Kluver Signed: 09/20/2022 7:35:08 PM By: Gretta Cool, BSN, RN, CWS, Kim RN, BSN Entered By: Massie Kluver on 09/20/2022 14:56:26 -------------------------------------------------------------------------------- Vitals Details Patient Name: Date of Service: MA [name], DIA [place] M. 09/20/2022 2:45 PM Medical Record Number: [id number] Patient Account Number: [id number] Date of Birth/Sex: Treating RN: 1950-10-24 (72 y.o. [name], Kim Primary Care Cayleen Benjamin: Ria Bush Other Clinician: Massie Kluver Referring Saul Fabiano: Treating Vanisha Whiten/Extender: Ree Kida Weeks in Treatment: 3 Vital Signs Time Taken: 14:43 Temperature (F): 98.3 Height (in): 62 Pulse (bpm): 96 Weight (lbs): 211 Respiratory Rate (breaths/min): 18 Body Mass Index (BMI): 38.6 Blood Pressure (mmHg): 133/83 Reference Range: 80 - 120 mg / dl Electronic Signature(s) Signed: 09/20/2022 4:51:29 PM By: Massie Kluver Entered By: Massie Kluver on 09/20/2022 14:46:12

## 2022-09-21 NOTE — Telephone Encounter (Signed)
Patient came by and picked up medication 

## 2022-09-27 ENCOUNTER — Ambulatory Visit: Payer: Medicare HMO | Admitting: Internal Medicine

## 2022-10-04 ENCOUNTER — Ambulatory Visit: Payer: Medicare HMO | Admitting: Internal Medicine

## 2022-10-07 DIAGNOSIS — J449 Chronic obstructive pulmonary disease, unspecified: Secondary | ICD-10-CM | POA: Diagnosis not present

## 2022-10-12 ENCOUNTER — Telehealth: Payer: Self-pay

## 2022-10-12 ENCOUNTER — Telehealth: Payer: Self-pay | Admitting: Family Medicine

## 2022-10-12 NOTE — Progress Notes (Signed)
Care Management & Coordination Services Pharmacy Team  Reason for Encounter: Appointment Reminder  Contacted patient to confirm telephone appointment with Charlene Brooke, PharmD on 10/18/2022 at 11:00.  Unsuccessful outreach. Unable to leave voicemail. Star Rating Drugs:  Medication:  Last Fill: Day Supply Metformin 500 mg 07/27/2022 90 Ozempic 0.5 mg PAP Simvastatin 40 mg 08/04/2022 90  Care Gaps: Annual wellness visit in last year? Yes 11/11/2021  If Diabetic: Last eye exam / retinopathy screening: Up to date Last diabetic foot exam: Up to date  Charlene Brooke, PharmD notified  Marijean Niemann, [place] Assistant [phone]

## 2022-10-12 NOTE — Telephone Encounter (Signed)
Contacted [name] to schedule their annual wellness visit. Appointment made for 11/15/2022.  [employer] Dial: [phone]

## 2022-10-18 ENCOUNTER — Ambulatory Visit: Payer: Medicare HMO | Admitting: Pharmacist

## 2022-10-18 NOTE — Progress Notes (Signed)
Care Management & Coordination Services Pharmacy Note  10/18/2022 Name:  [name] MRN:  [id number] DOB:  [birth year]  Summary: F/U visit -DM: A1c 5.8% (07/2022) on metformin and Ozempic; pt reports she reduced metformin from 3 to 2 tablets daily after having lower glucose in 70-90s; with lower metformin dose fasting BG is 100-130 generally -HTN/HF: BP range 104/71 - 129/77 at goal;  she reports dry weight is ~212 lbs, she has been 213-215 lbs over the last week or so. She also has been taking whole tablet of torsemide (20 mg) for about a month due to increased swelling; discussed concern for kidney function when increasing diuretics, she is having labwork in 3 weeks, reasonable to wait until then to check BMP (denies s/sx dehydration) -COPD: pt reports chronic SOB; she has tried to get portable O2 tank in the past but did not qualify   Recommendations/Changes made from today's visit: -Discussed it is reasonable to continue metformin 1000 mg/day; updated med list -Advised to continue torsemide 20 mg daily and contact cardiology if wt gain >3 lbs overnight; recheck BMP in 3 weeks w/ CPE labs -Advised pt to contact pulmonology to see if reasonable to re-test for portable O2  Follow up plan: -[facility] will call patient 1 month for HF update -Pharmacist follow up televisit scheduled for 3 months -PCP appt 11/15/22    Subjective: [name] is an 72 y.o. year old female who is a primary patient of Ria Bush, MD.  The care coordination team was consulted for assistance with disease management and care coordination needs.    Engaged with patient by telephone for follow up visit.  Recent office visits: 05/16/22 Dr Danise Mina OV: f/u - A1c 6.0%; reduce metformin to 500/1000 mg.  Recent consult visits: 06/08/22 Dr Patsey Berthold (Pulmonary): Breztri samples given  Hospital visits: None in previous 6 months   Objective:  Lab Results  Component Value Date   CREATININE  0.70 02/23/2022   BUN 13 02/23/2022   GFR 62.06 11/11/2021   GFRNONAA >60 02/23/2022   GFRAA 80 08/26/2020   NA 139 02/23/2022   K 4.1 02/23/2022   CALCIUM 9.7 02/23/2022   CO2 24 02/23/2022   GLUCOSE 155 (H) 02/23/2022    Lab Results  Component Value Date/Time   HGBA1C 5.8 (A) 08/15/2022 11:25 AM   HGBA1C 6.0 (A) 05/16/2022 10:45 AM   HGBA1C 6.6 (H) 11/11/2021 10:37 AM   HGBA1C 5.9 (H) 07/30/2020 12:00 PM   GFR 62.06 11/11/2021 10:37 AM   GFR 86.14 09/27/2020 11:17 AM   MICROALBUR 1.6 11/11/2021 10:37 AM   MICROALBUR 2.4 (H) 12/27/2020 11:54 AM    Last diabetic Eye exam:  Lab Results  Component Value Date/Time   HMDIABEYEEXA No Retinopathy 02/14/2022 12:00 AM    Last diabetic Foot exam:  Lab Results  Component Value Date/Time   HMDIABFOOTEX done 02/25/2013 12:00 AM     Lab Results  Component Value Date   CHOL 126 11/11/2021   HDL 38.50 (L) 11/11/2021   LDLCALC 48 12/08/2013   LDLDIRECT 53.0 11/11/2021   TRIG 297.0 (H) 11/11/2021   CHOLHDL 3 11/11/2021       Latest Ref Rng & Units 11/11/2021   10:37 AM 09/14/2020    1:15 PM 08/02/2020   11:30 AM  Hepatic Function  Total Protein 6.0 - 8.3 g/dL 7.1  7.3  7.3   Albumin 3.5 - 5.2 g/dL 4.5  4.1  3.8   AST 0 - 37 U/L 14  13  20   ALT 0 - 35 U/L '13  9  20   '$ Alk Phosphatase 39 - 117 U/L 51  65  40   Total Bilirubin 0.2 - 1.2 mg/dL 0.2  0.3  0.6     Lab Results  Component Value Date/Time   TSH 1.86 11/11/2021 10:37 AM   TSH 1.37 09/14/2020 01:15 PM   FREET4 0.94 04/10/2017 09:53 AM   FREET4 0.79 03/30/2016 08:52 AM       Latest Ref Rng & Units 02/23/2022    8:08 AM 11/11/2021   10:37 AM 02/25/2021   10:36 AM  CBC  WBC 4.0 - 10.5 K/uL 16.4  12.1  11.7   Hemoglobin 12.0 - 15.0 g/dL 11.5  10.9  10.7   Hematocrit 36.0 - 46.0 % 36.5  34.2  33.1   Platelets 150 - 400 K/uL 433  424.0  427     No results found for: "VD25OH", "VITAMINB12"  Clinical ASCVD: Yes  The ASCVD Risk score (Arnett DK, et al., 2019)  failed to calculate for the following reasons:   The valid total cholesterol range is 130 to 320 mg/dL       11/11/2021   10:42 AM 11/23/2020    1:16 PM 10/11/2020    4:17 PM  Depression screen PHQ 2/9  Decreased Interest 0 0 1  Down, Depressed, Hopeless 0 0 0  PHQ - 2 Score 0 0 1  Altered sleeping 0 0 1  Tired, decreased energy 1 0 1  Change in appetite 0 0 2  Feeling bad or failure about yourself  0 0 0  Trouble concentrating 0 0 0  Moving slowly or fidgety/restless 0 0 0  Suicidal thoughts 0 0 0  PHQ-9 Score 1 0 5  Difficult doing work/chores  Not difficult at all Not difficult at all     Social History   Tobacco Use  Smoking Status Former   Packs/day: 2.00   Years: 43.00   Total pack years: 86.00   Types: Cigarettes   Quit date: 05/17/2010   Years since quitting: 12.4  Smokeless Tobacco Never  Tobacco Comments   QUIT 2011   BP Readings from Last 3 Encounters:  08/15/22 (!) 120/54  07/19/22 (!) 90/58  06/08/22 136/80   Pulse Readings from Last 3 Encounters:  08/15/22 94  07/19/22 91  06/08/22 92   Wt Readings from Last 3 Encounters:  08/15/22 212 lb 9.6 oz (96.4 kg)  07/19/22 215 lb (97.5 kg)  06/08/22 217 lb 6.4 oz (98.6 kg)   BMI Readings from Last 3 Encounters:  08/15/22 38.89 kg/m  07/19/22 39.32 kg/m  06/08/22 39.76 kg/m    Allergies  Allergen Reactions   Crestor [Rosuvastatin] Other (See Comments)    myalgias   Lipitor [Atorvastatin] Other (See Comments)    Mood swings   Lithium Other (See Comments)    abd pain, n/v, was hospitalized   Quetiapine Other (See Comments)    Muscle twitching, muscle spasm    Medications Reviewed Today     Reviewed by Charlton Haws, [facility] (Pharmacist) on 10/18/22 at 72  Med List Status: <None>   Medication Order Taking? Sig Documenting Provider Last Dose Status Informant  acetaminophen (TYLENOL 8 HOUR ARTHRITIS PAIN) 650 MG CR tablet [id number] Yes Take 650 mg by mouth every 8 (eight) hours as needed  for pain. [provider] Taking Active Self  albuterol (VENTOLIN HFA) 108 (90 Base) MCG/ACT inhaler [id number] Yes Inhale 2 puffs into the  lungs every 6 (six) hours as needed for wheezing or shortness of breath. Tyler Pita, MD Taking Active   Apple Cider Vinegar 600 MG CAPS [id number] Yes  [provider] Taking Active   aspirin 81 MG EC tablet [id number] Yes Take 1 tablet (81 mg total) by mouth daily. Kate Sable, MD Taking Active   aspirin-acetaminophen-caffeine [facility] MIGRAINE) [phone] MG tablet [id number] Yes Take by mouth every 6 (six) hours as needed for headache. [provider] Taking Active   Blood Glucose Monitoring Suppl (ONE TOUCH ULTRA 2) w/Device KIT [id number] Yes Use as instructed to check blood sugar 2 times a day. Ria Bush, MD Taking Active   Boswellia-Glucosamine-Vit D (OSTEO BI-FLEX ONE PER DAY PO) [id number] Yes Take 1 tablet by mouth daily. [provider] Taking Active   Budeson-Glycopyrrol-Formoterol (BREZTRI AEROSPHERE) 160-9-4.8 MCG/ACT AERO [id number] Yes Inhale 2 puffs into the lungs in the morning and at bedtime. Tyler Pita, MD Taking Active   cholecalciferol (VITAMIN D3) 25 MCG (1000 UNIT) tablet [id number] Yes Take 1,000 Units by mouth daily. [provider] Taking Active   cyclobenzaprine (FLEXERIL) 10 MG tablet [id number] Yes TAKE 1/2 TO 1 TABLET BY MOUTH TWICE A DAY AS NEEDED FOR MUSCLE SPASMS AND MIGRAINE Ria Bush, MD Taking Active   famotidine (PEPCID) 20 MG tablet [id number] Yes Take 20 mg by mouth every other day. Alternates with omeprazole [provider] Taking Active Self  gabapentin (NEURONTIN) 100 MG capsule [id number] Yes TAKE 1 TO 3 CAPSULES AT    BEDTIME Ria Bush, MD Taking Active   glucose blood [facility] ULTRA) test strip [id number] Yes Use as instructed to check blood sugar 2 times a day Ria Bush, MD Taking Active   IBUPROFEN PO [id number] Yes Take  by mouth as needed. [provider] Taking Active Self  isosorbide mononitrate (IMDUR) 60 MG 24 hr tablet [id number]  Take 0.5 tablets (30 mg total) by mouth 2 (two) times daily. Theora Gianotti, NP  Expired 08/04/22 2359   Lancets (ONETOUCH DELICA PLUS [id number]) [employer] [id number] Yes Use as instructed to check blood sugar 2 times a day Ria Bush, MD Taking Active   levothyroxine (SYNTHROID) 100 MCG tablet [id number] Yes TAKE 1 TABLET DAILY, AND ON2 DAYS A WEEK, TAKE AN     EXTRA 1/2 TABLET Ria Bush, MD Taking Active   loratadine (CLARITIN) 10 MG tablet [id number] Yes Take 1 tablet (10 mg total) by mouth daily. Ria Bush, MD Taking Active Self  metFORMIN (GLUCOPHAGE) 500 MG tablet [id number] Yes Take 500 mg by mouth 2 (two) times daily with a meal. Ria Bush, MD Taking Active   metoprolol succinate (TOPROL-XL) 25 MG 24 hr tablet [id number] Yes Take 1 tablet (25 mg total) by mouth daily. Ria Bush, MD Taking Active   mometasone (ELOCON) 0.1 % cream [id number] Yes Apply 1 Application topically daily as needed (Rash). Ralene Bathe, MD Taking Active   Multiple Vitamin (MULTIVITAMIN WITH MINERALS) TABS tablet [id number] Yes Take 1 tablet by mouth daily. One A Day for Women [provider] Taking Active Self  mupirocin ointment (BACTROBAN) 2 % [id number] Yes Apply 1 Application topically 2 (two) times daily. Ralene Bathe, MD Taking Active   nystatin (MYCOSTATIN) 100000 UNIT/ML suspension [id number] Yes Take 5 mLs (500,000 Units total) by mouth 3 (three) times daily. Ria Bush, MD Taking Active   nystatin cream Royden Purl) [id number] Yes APPLY TO Starke A Velora Heckler, MD Taking Active   omeprazole (PRILOSEC) 20 MG  capsule [id number] Yes Take 1 capsule (20 mg total) by mouth daily. Take 1 daily-Alternates with famotidine Ria Bush, MD Taking Active   OXYGEN [id number] Yes Inhale 2 L into the lungs at  bedtime. [provider] Taking Active Self  Rhubarb (ESTROVEN COMPLETE PO) [id number] Yes Take 1 tablet by mouth daily. [provider] Taking Active Self  Semaglutide,0.25 or 0.'5MG'$ /DOS, (OZEMPIC, 0.25 OR 0.5 MG/DOSE,) 2 MG/1.5ML SOPN [id number] Yes Inject 0.5 mg into the skin once a week. [employer] PAP [provider] Taking Active   simvastatin (ZOCOR) 40 MG tablet [id number] Yes Take 1 tablet (40 mg total) by mouth at bedtime. Ria Bush, MD Taking Active   torsemide [facility]) 20 MG tablet [id number] Yes Take 0.5 tablets (10 mg total) by mouth as directed. Take one half tablet (10 mg) once a day and may take additional one half tablet (10 mg) as needed for weight gain or shortness of breath.  Patient taking differently: Take 20 mg by mouth daily. Take one half tablet (10 mg) once a day and may take additional one half tablet (10 mg) as needed for weight gain or shortness of breath.   Theora Gianotti, NP Taking Active            Med Note Malena Catholic Oct 18, 2022 12:10 PM) Taking whole tablet since ~09/21/22  traMADol (ULTRAM) 50 MG tablet [id number] Yes TAKE 1 TABLET (50 MG TOTAL) BY MOUTH TWICE A DAY AS NEEDED FOR MODERATE PAIN Ria Bush, MD Taking Active   vitamin B-12 (CYANOCOBALAMIN) 1000 MCG tablet [id number] Yes Take 1,000 mcg by mouth daily. [provider] Taking Active   ziprasidone (GEODON) 40 MG capsule [id number] Yes TAKE 1 CAPSULE EVERY       MORNING AND AT BEDTIME Ria Bush, MD Taking Active             SDOH:  (Social Determinants of Health) assessments and interventions performed: No SDOH Interventions    Flowsheet Row Chronic Care Management from 02/15/2022 in [place] at [place] from 11/23/2020 in [place] at [place] from 04/10/2017 in [place] at [place]  SDOH Interventions      Depression Interventions/Treatment  -- [id number] Score <4 Follow-up Not Indicated, Currently on Treatment --  [pt is being referred to PCP for further evaluation]  Financial Strain Interventions Other (Comment)  [PAP Judithann Sauger, Ozempic] -- --       Medication Assistance:  [employer] approved 2024 Judithann Sauger - [place] approved 2024 (through pulmonary)  Medication Access: Within the past 30 days, how often has patient missed a dose of medication? 0 Is a pillbox or other method used to improve adherence? Yes  Factors that may affect medication adherence? no barriers identified Are meds synced by current pharmacy? No  Are meds delivered by current pharmacy? Yes  Does patient experience delays in picking up medications due to transportation concerns? No   [facility] Services Reviewed: Is patient disadvantaged to use [facility]?: Yes  Current Rx insurance plan: [employer] Name and location of [id number] [place] [place]: [phone]: [phone] [facility] [place] [facility] [place] [address] [place]Phone: [phone]: [phone] [facility] services reviewed with patient today?:  No  Patient requests to transfer care to [facility]?: No  Reason patient declined to change pharmacies: Not mentioned at this visit  Compliance/Adherence/Medication fill history: Care Gaps: Colonoscopy Lung cancer screening  Star-Rating Drugs: Simvastatn - PDC 91% Metformin - PDC 87% (pt reduced dose from 3 to 2 tab daily; updated SIG) Ozempic - PAP   ASSESSMENT / PLAN  Hypertension / Heart Failure (BP goal <130/80) -Controlled - taking 1 full tablet of torsemide now d/t feet swelling (beginning of month) BP is near goal; pt reports compliance with torsemide as below,  she has not needed extra PM dose for excess swelling/SOB in the past several weeks -pt reports marked improvement in chest pain since starting metoprolol;  -ECHO 05/2020 - LVEF 40% and Grade 1 diastolic dysfunction (prior to CABG) -Denies hypotensive/hypertensive symptoms -Current home readings: 112/68, 129/77, 104/71, 116/76, 122/76, 91/50 -Daily Weight: 214, 214.2, 215; she reports dry weight is ~212# -Current treatment: Isosorbide MN 60 mg -1/2 tab BID - Appropriate, Effective, Safe, Accessible Torsemide 20 mg 1 tab daily - Appropriate, Effective, Safe, Accessible Metoprolol succinate 25 mg daily PM - Appropriate, Effective, Safe, Accessible -Educated on BP goals and benefits of medications for prevention of heart attack, stroke and kidney damage; Daily salt intake goal < 2300 mg; -Discussed dry weight and instructions for torsemide; given increased swelling and wt above dry weight, it is reasonable to continue torsemide 20 mg/day; Advised to contact cardiology if wt gain > 3 lbs overnight -reviewed impact of increasing diuretic on kidney function, she denies s/sx dehydration, reasonable to wait until CPE labs in 3 weeks to recheck BMP -Counseled to monitor BP at home daily -Recommended to continue current medication  Hyperlipidemia: (LDL goal < 70) -Controlled - LDL 53 (pt endorses compliance with statin and denies issues -Hx CAD, CABG 2012, 07/2020; [place] 11/09/2020 with no significant ischemia, fixed apical defect. -Current treatment: Simvastatin 40 mg daily - Appropriate, Effective, Safe, Accessible Aspirin 81 mg daily -Appropriate, Effective, Safe, Accessible -Educated on Cholesterol goals; Benefits of statin for ASCVD risk reduction; -Recommended to continue current medication  Diabetes (A1c goal <7%) -Controlled - A1c 5.8% (07/2022); pt has reduced metformin from 3 to 2 tablets daily d/t lower glucose (70-90s range) with symptoms of hypoglycemia; she has not had issues  since reducing metformin -Current home glucose readings fasting glucose: 123, 82, 129, 104, 110, 103, 122,  With metformin 3 tab/day: 91, 87, 98, 98, 94, 70, 79, 74 Post-prandial glucose: 150,110, 125, -Denies hypoglycemic/hyperglycemic symptoms -Current medications: Metformin 500 mg BID -Appropriate, Effective, Safe, Accessible Ozempic 0.5 mg weekly - Appropriate, Effective, Safe, Accessible Testing supplies -Medications previously tried: Novolin N, glimepiride, pioglitazone -Current exercise: step exercises - 4000 steps/day (limited d/t knees, SOB) -Discussed it is reasonable to continue metformin 1000 mg/day, updated med list -Recommend to continue current medication  COPD (Goal: control symptoms and prevent exacerbations) -Stable - Pt notes improvement since taking Breztri now that she gets it free through PAP; she reports chronic SOB that is unchanged but bothersome; she reports she tried to get portable O2 in the past but did not qualify -Gold Grade: Gold 1 (FEV1>80%) -Current COPD Classification:  A (low sx, <2 exacerbations/yr) -MMRC/CAT score: not on file -Pulmonary function testing: 04/20/20 - FEV1 84% predicted, FEV1/FVC 0.76 -Exacerbations requiring treatment in last 6 months: 0 -Current treatment  Breztri 160-9-4.8 mcg/act 2 puffs BID (PAP) -Appropriate, Effective, Safe, Accessible Albuterol HFA prn - Appropriate, Effective, Safe, Accessible Oxygen 2L HS - wants portable  -Medications  previously tried: Trelegy ($$), Anoro, Symbciort  -Patient denies consistent use of maintenance inhaler -Frequency of rescue inhaler use: rare -Advised to contact pulmonary to discuss if re-testing for portable O2 is an option -Recommend to continue current medication  Bipolar disorder (Goal: manage symptoms) -Controlled - Pt reports "ziprasidone is a life saver"; she never wants to change this med -Current treatment: Ziprasidone 40 mg BID -Appropriate, Effective, Safe,  Accessible -Medications previously tried/failed: quetiapine, diazepam -Connected with PCP for mental health support -Recommended to continue current medication  Pain (Goal: manage symptoms) -Not ideally controlled - pt reports gel injections in her knee were not helpful; she is meant to follow up with ortho for next steps but will have a hard time getting to any appts in immediate future due to husband's poor health -osteoarthritis of knee, cervical pain, fibromyalgia -Current treatment  Cyclobenzaprine 10 mg PRN -Appropriate, Query Effective Gabapentin 100 mg 1-3 cap HS -Appropriate, Query Effective Ibuprofen 200 mg PRN - Appropriate, Effective,  Query Safe Tylenol 650 mg - 2 tab BID -Appropriate, Query Effective Tramadol 50 mg PRN -Appropriate, Query Effective -Counseled on benefits/risks of increasing gabapentin, advised it is safe to try higher dose -Counseled on risks of chronic NSAID use -  advised her to use the lowest effective dose of NSAID to control her pain in order to reduce CV and kidney risks  Health Maintenance -Vaccine gaps: TDAP, covid booster -Hx migraines; tx with Excedrin and flexeril; pt previously tried Ajovy but stopped d/t cost  Charlene Brooke, PharmD, [employer] Clinical Pharmacist [facility] [phone]

## 2022-10-18 NOTE — Patient Instructions (Signed)
Visit Information  Phone number for Pharmacist: [phone]  Thank you for meeting with me to discuss your medications! Below is a summary of what we talked about during the visit:     Recommendations/Changes made from today's visit: -Discussed it is reasonable to continue metformin 1000 mg/day; updated med list -Advised to continue torsemide 20 mg daily and contact cardiology if wt gain >3 lbs overnight; recheck BMP in 3 weeks w/ CPE labs -Advised pt to contact pulmonology to see if reasonable to re-test for portable O2  Follow up plan: -Health Concierge will call patient 1 month for HF update -Pharmacist follow up televisit scheduled for 3 months -PCP appt 11/15/22   Charlene Brooke, PharmD, BCACP Clinical Pharmacist [facility] at [facility] [phone]

## 2022-10-26 ENCOUNTER — Encounter: Payer: Self-pay | Admitting: Family Medicine

## 2022-10-26 DIAGNOSIS — K219 Gastro-esophageal reflux disease without esophagitis: Secondary | ICD-10-CM

## 2022-10-26 DIAGNOSIS — E1169 Type 2 diabetes mellitus with other specified complication: Secondary | ICD-10-CM

## 2022-10-27 MED ORDER — OMEPRAZOLE 20 MG PO CPDR: 20.0000 mg | DELAYED_RELEASE_CAPSULE | Freq: Every day | ORAL | 0 refills | Status: DC

## 2022-10-27 MED ORDER — SIMVASTATIN 40 MG PO TABS: 40.0000 mg | ORAL_TABLET | Freq: Every day | ORAL | 0 refills | Status: DC

## 2022-10-27 NOTE — Telephone Encounter (Signed)
E-scribed refills.  

## 2022-10-29 ENCOUNTER — Other Ambulatory Visit: Payer: Self-pay | Admitting: Family Medicine

## 2022-10-29 DIAGNOSIS — E1169 Type 2 diabetes mellitus with other specified complication: Secondary | ICD-10-CM

## 2022-10-30 ENCOUNTER — Other Ambulatory Visit: Payer: Self-pay | Admitting: Family Medicine

## 2022-10-30 DIAGNOSIS — K219 Gastro-esophageal reflux disease without esophagitis: Secondary | ICD-10-CM

## 2022-10-31 NOTE — Telephone Encounter (Signed)
Message from pharmacy:  The directions are unclear on the original prescription for PRILOSEC '20MG'$  dated 10/27/2022. Please respond with intended directions or comment to Pharmacy.  ALTERNATING? SHOULD IT BE EVERY OTHER DAY?   Omeprazole Last rx:  10/27/22, #90 Last OV:  08/15/22, 3 mo DM f/u Next OV:  11/15/22, CPE

## 2022-10-31 NOTE — Telephone Encounter (Signed)
ERx 

## 2022-11-04 ENCOUNTER — Other Ambulatory Visit: Payer: Self-pay | Admitting: Family Medicine

## 2022-11-05 DIAGNOSIS — J449 Chronic obstructive pulmonary disease, unspecified: Secondary | ICD-10-CM | POA: Diagnosis not present

## 2022-11-06 NOTE — Telephone Encounter (Signed)
Refill request Tramadol Last refill 05/16/22 #30 Last office visit 08/15/22 Upcoming 11/15/22

## 2022-11-07 ENCOUNTER — Other Ambulatory Visit: Payer: Self-pay | Admitting: Nurse Practitioner

## 2022-11-07 ENCOUNTER — Other Ambulatory Visit: Payer: Self-pay | Admitting: Family Medicine

## 2022-11-07 DIAGNOSIS — E1169 Type 2 diabetes mellitus with other specified complication: Secondary | ICD-10-CM

## 2022-11-07 DIAGNOSIS — D649 Anemia, unspecified: Secondary | ICD-10-CM

## 2022-11-07 DIAGNOSIS — E039 Hypothyroidism, unspecified: Secondary | ICD-10-CM

## 2022-11-07 DIAGNOSIS — E785 Hyperlipidemia, unspecified: Secondary | ICD-10-CM

## 2022-11-07 NOTE — Telephone Encounter (Signed)
ERx 

## 2022-11-08 ENCOUNTER — Other Ambulatory Visit (INDEPENDENT_AMBULATORY_CARE_PROVIDER_SITE_OTHER): Payer: Medicare HMO

## 2022-11-08 DIAGNOSIS — D649 Anemia, unspecified: Secondary | ICD-10-CM | POA: Diagnosis not present

## 2022-11-08 DIAGNOSIS — E1169 Type 2 diabetes mellitus with other specified complication: Secondary | ICD-10-CM

## 2022-11-08 DIAGNOSIS — E039 Hypothyroidism, unspecified: Secondary | ICD-10-CM

## 2022-11-08 DIAGNOSIS — E785 Hyperlipidemia, unspecified: Secondary | ICD-10-CM | POA: Diagnosis not present

## 2022-11-08 LAB — COMPREHENSIVE METABOLIC PANEL
ALT: 14 U/L (ref 0–35)
AST: 15 U/L (ref 0–37)
Albumin: 4.2 g/dL (ref 3.5–5.2)
Alkaline Phosphatase: 52 U/L (ref 39–117)
BUN: 10 mg/dL (ref 6–23)
CO2: 27 mEq/L (ref 19–32)
Calcium: 9.3 mg/dL (ref 8.4–10.5)
Chloride: 102 mEq/L (ref 96–112)
Creatinine, Ser: 0.82 mg/dL (ref 0.40–1.20)
GFR: 72.61 mL/min (ref >60.00)
Glucose, Bld: 116 mg/dL — ABNORMAL HIGH (ref 70–99)
Potassium: 4.2 mEq/L (ref 3.5–5.1)
Sodium: 140 mEq/L (ref 135–145)
Total Bilirubin: 0.2 mg/dL (ref 0.2–1.2)
Total Protein: 7.2 g/dL (ref 6.0–8.3)

## 2022-11-08 LAB — CBC WITH DIFFERENTIAL/PLATELET
Basophils Absolute: 0.1 10*3/uL (ref 0.0–0.1)
Basophils Relative: 0.9 % (ref 0.0–3.0)
Eosinophils Absolute: 0.1 10*3/uL (ref 0.0–0.7)
Eosinophils Relative: 1.1 % (ref 0.0–5.0)
HCT: 36.7 % (ref 36.0–46.0)
Hemoglobin: 12.2 g/dL (ref 12.0–15.0)
Lymphocytes Relative: 24.1 % (ref 12.0–46.0)
Lymphs Abs: 2.5 10*3/uL (ref 0.7–4.0)
MCHC: 33.1 g/dL (ref 30.0–36.0)
MCV: 87.4 fl (ref 78.0–100.0)
Monocytes Absolute: 0.6 10*3/uL (ref 0.1–1.0)
Monocytes Relative: 5.9 % (ref 3.0–12.0)
Neutro Abs: 7 10*3/uL (ref 1.4–7.7)
Neutrophils Relative %: 68 % (ref 43.0–77.0)
Platelets: 432 10*3/uL — ABNORMAL HIGH (ref 150.0–400.0)
RBC: 4.2 Mil/uL (ref 3.87–5.11)
RDW: 14.6 % (ref 11.5–15.5)
WBC: 10.4 10*3/uL (ref 4.0–10.5)

## 2022-11-08 LAB — LIPID PANEL
Cholesterol: 147 mg/dL (ref 0–200)
HDL: 44.3 mg/dL (ref >39.00)
NonHDL: 102.67
Total CHOL/HDL Ratio: 3
Triglycerides: 364 mg/dL — ABNORMAL HIGH (ref 0.0–149.0)
VLDL: 72.8 mg/dL — ABNORMAL HIGH (ref 0.0–40.0)

## 2022-11-08 LAB — IBC PANEL
Iron: 47 ug/dL (ref 42–145)
Saturation Ratios: 10.5 % — ABNORMAL LOW (ref 20.0–50.0)
TIBC: 448 ug/dL (ref 250.0–450.0)
Transferrin: 320 mg/dL (ref 212.0–360.0)

## 2022-11-08 LAB — FOLATE: Folate: >23.8 ng/mL (ref >5.9)

## 2022-11-08 LAB — MICROALBUMIN / CREATININE URINE RATIO
Creatinine,U: 27.7 mg/dL
Microalb Creat Ratio: 2.5 mg/g (ref 0.0–30.0)
Microalb, Ur: <0.7 mg/dL (ref 0.0–1.9)

## 2022-11-08 LAB — FERRITIN: Ferritin: 6.5 ng/mL — ABNORMAL LOW (ref 10.0–291.0)

## 2022-11-08 LAB — VITAMIN B12: Vitamin B-12: >1500 pg/mL — ABNORMAL HIGH (ref 211–911)

## 2022-11-08 LAB — HEMOGLOBIN A1C: Hgb A1c MFr Bld: 6.5 % (ref 4.6–6.5)

## 2022-11-08 LAB — LDL CHOLESTEROL, DIRECT: Direct LDL: 54 mg/dL

## 2022-11-08 LAB — TSH: TSH: 3.01 u[IU]/mL (ref 0.35–5.50)

## 2022-11-15 ENCOUNTER — Ambulatory Visit (INDEPENDENT_AMBULATORY_CARE_PROVIDER_SITE_OTHER): Payer: Medicare HMO | Admitting: Family Medicine

## 2022-11-15 ENCOUNTER — Ambulatory Visit (INDEPENDENT_AMBULATORY_CARE_PROVIDER_SITE_OTHER): Payer: Medicare HMO

## 2022-11-15 ENCOUNTER — Encounter: Payer: Self-pay | Admitting: Family Medicine

## 2022-11-15 VITALS — BP 118/60 | HR 96 | Resp 17 | Ht 62.0 in | Wt 225.8 lb

## 2022-11-15 VITALS — BP 122/64 | HR 65 | Temp 97.2°F | Ht 61.0 in | Wt 225.1 lb

## 2022-11-15 DIAGNOSIS — K219 Gastro-esophageal reflux disease without esophagitis: Secondary | ICD-10-CM

## 2022-11-15 DIAGNOSIS — J449 Chronic obstructive pulmonary disease, unspecified: Secondary | ICD-10-CM | POA: Diagnosis not present

## 2022-11-15 DIAGNOSIS — M159 Polyosteoarthritis, unspecified: Secondary | ICD-10-CM

## 2022-11-15 DIAGNOSIS — F3178 Bipolar disorder, in full remission, most recent episode mixed: Secondary | ICD-10-CM

## 2022-11-15 DIAGNOSIS — I10 Essential (primary) hypertension: Secondary | ICD-10-CM | POA: Diagnosis not present

## 2022-11-15 DIAGNOSIS — D75839 Thrombocytosis, unspecified: Secondary | ICD-10-CM | POA: Diagnosis not present

## 2022-11-15 DIAGNOSIS — E039 Hypothyroidism, unspecified: Secondary | ICD-10-CM | POA: Diagnosis not present

## 2022-11-15 DIAGNOSIS — E785 Hyperlipidemia, unspecified: Secondary | ICD-10-CM

## 2022-11-15 DIAGNOSIS — Z0000 Encounter for general adult medical examination without abnormal findings: Secondary | ICD-10-CM

## 2022-11-15 DIAGNOSIS — R69 Illness, unspecified: Secondary | ICD-10-CM | POA: Diagnosis not present

## 2022-11-15 DIAGNOSIS — R7989 Other specified abnormal findings of blood chemistry: Secondary | ICD-10-CM

## 2022-11-15 DIAGNOSIS — Z7189 Other specified counseling: Secondary | ICD-10-CM | POA: Diagnosis not present

## 2022-11-15 DIAGNOSIS — E1169 Type 2 diabetes mellitus with other specified complication: Secondary | ICD-10-CM

## 2022-11-15 DIAGNOSIS — M150 Primary generalized (osteo)arthritis: Secondary | ICD-10-CM

## 2022-11-15 DIAGNOSIS — R748 Abnormal levels of other serum enzymes: Secondary | ICD-10-CM

## 2022-11-15 DIAGNOSIS — Z1211 Encounter for screening for malignant neoplasm of colon: Secondary | ICD-10-CM

## 2022-11-15 DIAGNOSIS — Z951 Presence of aortocoronary bypass graft: Secondary | ICD-10-CM

## 2022-11-15 DIAGNOSIS — I25118 Atherosclerotic heart disease of native coronary artery with other forms of angina pectoris: Secondary | ICD-10-CM

## 2022-11-15 DIAGNOSIS — L988 Other specified disorders of the skin and subcutaneous tissue: Secondary | ICD-10-CM | POA: Diagnosis not present

## 2022-11-15 DIAGNOSIS — E611 Iron deficiency: Secondary | ICD-10-CM

## 2022-11-15 DIAGNOSIS — M5090 Cervical disc disorder, unspecified, unspecified cervical region: Secondary | ICD-10-CM

## 2022-11-15 MED ORDER — IRON (FERROUS SULFATE) 325 (65 FE) MG PO TABS: 325.0000 mg | ORAL_TABLET | ORAL | Status: AC

## 2022-11-15 MED ORDER — METFORMIN HCL 500 MG PO TABS: 500.0000 mg | ORAL_TABLET | Freq: Two times a day (BID) | ORAL | 4 refills | Status: DC

## 2022-11-15 NOTE — Patient Instructions (Signed)
[name] , Thank you for taking time to come for your Medicare Wellness Visit. I appreciate your ongoing commitment to your health goals. Please review the following plan we discussed and let me know if I can assist you in the future.   These are the goals we discussed:  Goals      Increase water intake     Starting 04/15/2018, I will continue to drink at least 6-8 glasses of water daily.      Manage My Medicine     Timeframe:  Long-Range Goal Priority:  High Start Date:     05/11/21                         Expected End Date:  05/11/22                     Follow Up Date Jan 2023   - call for medicine refill 2 or 3 days before it runs out - call if I am sick and can't take my medicine - keep a list of all the medicines I take; vitamins and herbals too - use a pillbox to sort medicine  -Reduce naproxen to 1 tablet daily, up to 2 daily if needed -Contact neurologist regarding migraine medication -Increase metformin 500 mg to 2 tablets twice a day   Why is this important?   These steps will help you keep on track with your medicines.   Notes:      Monitor and Manage My Blood Sugar-Diabetes Type 2     Timeframe:  Long-Range Goal Priority:  High Start Date:      10/10/21                       Expected End Date:   10/10/22                    Follow Up Date April 2023   - check blood sugar at prescribed times - check blood sugar if I feel it is too high or too low - enter blood sugar readings and medication or insulin into daily log - take the blood sugar log to all doctor visits    Why is this important?   Checking your blood sugar at home helps to keep it from getting very high or very low.  Writing the results in a diary or log helps the doctor know how to care for you.  Your blood sugar log should have the time, date and the results.  Also, write down the amount of insulin or other medicine that you take.  Other information, like what you ate, exercise done and how you were  feeling, will also be helpful.     Notes:      Patient advised to follow up AWV and Vaccines     Patient Stated     11/23/2020, I will maintain and continue medications as prescribed.      Patient Stated     No new goals.        This is a list of the screening recommended for you and due dates:  Health Maintenance  Topic Date Due   Screening for Lung Cancer  04/13/2021   DTaP/Tdap/Td vaccine (2 - Tdap) 04/17/2021   Colon Cancer Screening  01/09/2022   COVID-19 Vaccine (5 - 2023-24 season) 09/09/2022   Mammogram  12/11/2022   Complete foot exam   02/14/2023  Eye exam for diabetics  02/15/2023   Hemoglobin A1C  05/11/2023   Yearly kidney function blood test for diabetes  11/08/2023   Yearly kidney health urinalysis for diabetes  11/08/2023   Medicare Annual Wellness Visit  11/15/2023   Pneumonia Vaccine  Completed   Flu Shot  Completed   DEXA scan (bone density measurement)  Completed   Hepatitis C Screening: USPSTF Recommendation to screen - Ages 60-79 yo.  Completed   Zoster (Shingles) Vaccine  Completed   HPV Vaccine  Aged Out   Opioid Pain Medicine Management Opioids are powerful medicines that are used to treat moderate to severe pain. When used for short periods of time, they can help you to: Sleep better. Do better in physical or occupational therapy. Feel better in the first few days after an injury. Recover from surgery. Opioids should be taken with the supervision of a trained health care provider. They should be taken for the shortest period of time possible. This is because opioids can be addictive, and the longer you take opioids, the greater your risk of addiction. This addiction can also be called opioid use disorder. What are the risks? Using opioid pain medicines for longer than 3 days increases your risk of side effects. Side effects include: Constipation. Nausea and vomiting. Breathing difficulties (respiratory  depression). Drowsiness. Confusion. Opioid use disorder. Itching. Taking opioid pain medicine for a long period of time can affect your ability to do daily tasks. It also puts you at risk for: Motor vehicle crashes. Depression. Suicide. Heart attack. Overdose, which can be life-threatening. What is a pain treatment plan? A pain treatment plan is an agreement between you and your health care provider. Pain is unique to each person, and treatments vary depending on your condition. To manage your pain, you and your health care provider need to work together. To help you do this: Discuss the goals of your treatment, including how much pain you might expect to have and how you will manage the pain. Review the risks and benefits of taking opioid medicines. Remember that a good treatment plan uses more than one approach and minimizes the chance of side effects. Be honest about the amount of medicines you take and about any drug or alcohol use. Get pain medicine prescriptions from only one health care provider. Pain can be managed with many types of alternative treatments. Ask your health care provider to refer you to one or more specialists who can help you manage pain through: Physical or occupational therapy. Counseling (cognitive behavioral therapy). Good nutrition. Biofeedback. Massage. Meditation. Non-opioid medicine. Following a gentle exercise program. How to use opioid pain medicine Taking medicine Take your pain medicine exactly as told by your health care provider. Take it only when you need it. If your pain gets less severe, you may take less than your prescribed dose if your health care provider approves. If you are not having pain, do nottake pain medicine unless your health care provider tells you to take it. If your pain is severe, do nottry to treat it yourself by taking more pills than instructed on your prescription. Contact your health care provider for help. Write down  the times when you take your pain medicine. It is easy to become confused while on pain medicine. Writing the time can help you avoid overdose. Take other over-the-counter or prescription medicines only as told by your health care provider. Keeping yourself and others safe  While you are taking opioid pain medicine: Do not drive,  use machinery, or power tools. Do not sign legal documents. Do not drink alcohol. Do not take sleeping pills. Do not supervise children by yourself. Do not do activities that require climbing or being in high places. Do not go to a lake, river, ocean, spa, or swimming pool. Do not share your pain medicine with anyone. Keep pain medicine in a locked cabinet or in a secure area where pets and children cannot reach it. Stopping your use of opioids If you have been taking opioid medicine for more than a few weeks, you may need to slowly decrease (taper) how much you take until you stop completely. Tapering your use of opioids can decrease your risk of symptoms of withdrawal, such as: Pain and cramping in the abdomen. Nausea. Sweating. Sleepiness. Restlessness. Uncontrollable shaking (tremors). Cravings for the medicine. Do not attempt to taper your use of opioids on your own. Talk with your health care provider about how to do this. Your health care provider may prescribe a step-down schedule based on how much medicine you are taking and how long you have been taking it. Getting rid of leftover pills Do not save any leftover pills. Get rid of leftover pills safely by: Taking the medicine to a prescription take-back program. This is usually offered by the county or law enforcement. Bringing them to a pharmacy that has a drug disposal container. Flushing them down the toilet. Check the label or package insert of your medicine to see whether this is safe to do. Throwing them out in the trash. Check the label or package insert of your medicine to see whether this is  safe to do. If it is safe to throw it out, remove the medicine from the original container, put it into a sealable bag or container, and mix it with used coffee grounds, food scraps, dirt, or cat litter before putting it in the trash. Follow these instructions at home: Activity Do exercises as told by your health care provider. Avoid activities that make your pain worse. Return to your normal activities as told by your health care provider. Ask your health care provider what activities are safe for you. General instructions You may need to take these actions to prevent or treat constipation: Drink enough fluid to keep your urine pale yellow. Take over-the-counter or prescription medicines. Eat foods that are high in fiber, such as beans, whole grains, and fresh fruits and vegetables. Limit foods that are high in fat and processed sugars, such as fried or sweet foods. Keep all follow-up visits. This is important. Where to find support If you have been taking opioids for a long time, you may benefit from receiving support for quitting from a local support group or counselor. Ask your health care provider for a referral to these resources in your area. Where to find more information Centers for Disease Control and Prevention (CDC): http://www.wolf.info/ [place] Food and Drug Administration (FDA): [place] Get help right away if: You may have taken too much of an opioid (overdosed). Common symptoms of an overdose: Your breathing is slower or more shallow than normal. You have a very slow heartbeat (pulse). You have slurred speech. You have nausea and vomiting. Your pupils become very small. You have other potential symptoms: You are very confused. You faint or feel like you will faint. You have cold, clammy skin. You have blue lips or fingernails. You have thoughts of harming yourself or harming others. These symptoms may represent a serious problem that is an emergency. Do not  wait to see if the  symptoms will go away. Get medical help right away. Call your local emergency services (911 in the [place] Do not drive yourself to the hospital.  If you ever feel like you may hurt yourself or others, or have thoughts about taking your own life, get help right away. Go to your nearest emergency department or: Call your local emergency services (911 in the [place] Call the [facility] 704 312 6348 in the [place] Call a suicide crisis helpline, such as the [facility] at 425-578-9416 or 988 in the [place]. This is open 24 hours a day in the [place] Text the Crisis Text Line at 815-491-7976 (in the [place].). Summary Opioid medicines can help you manage moderate to severe pain for a short period of time. A pain treatment plan is an agreement between you and your health care provider. Discuss the goals of your treatment, including how much pain you might expect to have and how you will manage the pain. If you think that you or someone else may have taken too much of an opioid, get medical help right away. This information is not intended to replace advice given to you by your health care provider. Make sure you discuss any questions you have with your health care provider. Document Revised: 03/02/2021 Document Reviewed: 11/17/2020 Elsevier Patient Education  [place] directives: Advance directive discussed with you today. Even though you declined this today, please call our office should you change your mind, and we can give you the proper paperwork for you to fill out.   Conditions/risks identified: Aim for 30 minutes of exercise or brisk walking, 6-8 glasses of water, and 5 servings of fruits and vegetables each day.   Next appointment: Follow up in one year for your annual wellness visit 11/20/23 @ 1:30 telephone visit.   Preventive Care 70 Years and Older, Female Preventive care refers to lifestyle choices and visits with your health care  provider that can promote health and wellness. What does preventive care include? A yearly physical exam. This is also called an annual well check. Dental exams once or twice a year. Routine eye exams. Ask your health care provider how often you should have your eyes checked. Personal lifestyle choices, including: Daily care of your teeth and gums. Regular physical activity. Eating a healthy diet. Avoiding tobacco and drug use. Limiting alcohol use. Practicing safe sex. Taking low-dose aspirin every day. Taking vitamin and mineral supplements as recommended by your health care provider. What happens during an annual well check? The services and screenings done by your health care provider during your annual well check will depend on your age, overall health, lifestyle risk factors, and family history of disease. Counseling  Your health care provider may ask you questions about your: Alcohol use. Tobacco use. Drug use. Emotional well-being. Home and relationship well-being. Sexual activity. Eating habits. History of falls. Memory and ability to understand (cognition). Work and work Statistician. Reproductive health. Screening  You may have the following tests or measurements: Height, weight, and BMI. Blood pressure. Lipid and cholesterol levels. These may be checked every 5 years, or more frequently if you are over 24 years old. Skin check. Lung cancer screening. You may have this screening every year starting at age 60 if you have a 30-pack-year history of smoking and currently smoke or have quit within the past 15 years. Fecal occult blood test (FOBT) of the stool. You may have this test every  year starting at age 41. Flexible sigmoidoscopy or colonoscopy. You may have a sigmoidoscopy every 5 years or a colonoscopy every 10 years starting at age 61. Hepatitis C blood test. Hepatitis B blood test. Sexually transmitted disease (STD) testing. Diabetes screening. This is done by  checking your blood sugar (glucose) after you have not eaten for a while (fasting). You may have this done every 1-3 years. Bone density scan. This is done to screen for osteoporosis. You may have this done starting at age 43. Mammogram. This may be done every 1-2 years. Talk to your health care provider about how often you should have regular mammograms. Talk with your health care provider about your test results, treatment options, and if necessary, the need for more tests. Vaccines  Your health care provider may recommend certain vaccines, such as: Influenza vaccine. This is recommended every year. Tetanus, diphtheria, and acellular pertussis (Tdap, Td) vaccine. You may need a Td booster every 10 years. Zoster vaccine. You may need this after age 28. Pneumococcal 13-valent conjugate (PCV13) vaccine. One dose is recommended after age 56. Pneumococcal polysaccharide (PPSV23) vaccine. One dose is recommended after age 52. Talk to your health care provider about which screenings and vaccines you need and how often you need them. This information is not intended to replace advice given to you by your health care provider. Make sure you discuss any questions you have with your health care provider. Document Released: 09/03/2015 Document Revised: 04/26/2016 Document Reviewed: 06/08/2015 Elsevier Interactive Patient Education  2017 [place] Prevention in the Home Falls can cause injuries. They can happen to people of all ages. There are many things you can do to make your home safe and to help prevent falls. What can I do on the outside of my home? Regularly fix the edges of walkways and driveways and fix any cracks. Remove anything that might make you trip as you walk through a door, such as a raised step or threshold. Trim any bushes or trees on the path to your home. Use bright outdoor lighting. Clear any walking paths of anything that might make someone trip, such as rocks or  tools. Regularly check to see if handrails are loose or broken. Make sure that both sides of any steps have handrails. Any raised decks and porches should have guardrails on the edges. Have any leaves, snow, or ice cleared regularly. Use sand or salt on walking paths during winter. Clean up any spills in your garage right away. This includes oil or grease spills. What can I do in the bathroom? Use night lights. Install grab bars by the toilet and in the tub and shower. Do not use towel bars as grab bars. Use non-skid mats or decals in the tub or shower. If you need to sit down in the shower, use a plastic, non-slip stool. Keep the floor dry. Clean up any water that spills on the floor as soon as it happens. Remove soap buildup in the tub or shower regularly. Attach bath mats securely with double-sided non-slip rug tape. Do not have throw rugs and other things on the floor that can make you trip. What can I do in the bedroom? Use night lights. Make sure that you have a light by your bed that is easy to reach. Do not use any sheets or blankets that are too big for your bed. They should not hang down onto the floor. Have a firm chair that has side arms. You can use this  for support while you get dressed. Do not have throw rugs and other things on the floor that can make you trip. What can I do in the kitchen? Clean up any spills right away. Avoid walking on wet floors. Keep items that you use a lot in easy-to-reach places. If you need to reach something above you, use a strong step stool that has a grab bar. Keep electrical cords out of the way. Do not use floor polish or wax that makes floors slippery. If you must use wax, use non-skid floor wax. Do not have throw rugs and other things on the floor that can make you trip. What can I do with my stairs? Do not leave any items on the stairs. Make sure that there are handrails on both sides of the stairs and use them. Fix handrails that are  broken or loose. Make sure that handrails are as long as the stairways. Check any carpeting to make sure that it is firmly attached to the stairs. Fix any carpet that is loose or worn. Avoid having throw rugs at the top or bottom of the stairs. If you do have throw rugs, attach them to the floor with carpet tape. Make sure that you have a light switch at the top of the stairs and the bottom of the stairs. If you do not have them, ask someone to add them for you. What else can I do to help prevent falls? Wear shoes that: Do not have high heels. Have rubber bottoms. Are comfortable and fit you well. Are closed at the toe. Do not wear sandals. If you use a stepladder: Make sure that it is fully opened. Do not climb a closed stepladder. Make sure that both sides of the stepladder are locked into place. Ask someone to hold it for you, if possible. Clearly mark and make sure that you can see: Any grab bars or handrails. First and last steps. Where the edge of each step is. Use tools that help you move around (mobility aids) if they are needed. These include: Canes. Walkers. Scooters. Crutches. Turn on the lights when you go into a dark area. Replace any light bulbs as soon as they burn out. Set up your furniture so you have a clear path. Avoid moving your furniture around. If any of your floors are uneven, fix them. If there are any pets around you, be aware of where they are. Review your medicines with your doctor. Some medicines can make you feel dizzy. This can increase your chance of falling. Ask your doctor what other things that you can do to help prevent falls. This information is not intended to replace advice given to you by your health care provider. Make sure you discuss any questions you have with your health care provider. Document Released: 06/03/2009 Document Revised: 01/13/2016 Document Reviewed: 09/11/2014 Elsevier Interactive Patient Education  2017 [employer].

## 2022-11-15 NOTE — Progress Notes (Signed)
Patient ID: [name], female    DOB: April 21, 1951, 72 y.o.   MRN: [id number]  This visit was conducted in person.  BP 122/64   Pulse 65   Temp (!) 97.2 F (36.2 C) (Temporal)   Ht 5\' 1"  (1.549 m)   Wt 225 lb 2 oz (102.1 kg)   SpO2 95%   BMI 42.54 kg/m    CC: CPE Subjective:   HPI: [name] is a 72 y.o. female presenting on 11/15/2022 for Annual Exam (MCR prt 2 [AWV- 11/15/22]. )   Saw health advisor today for medicare wellness visit. Note reviewed.   No results found.  [facility] Visit from 11/15/2022 in [place] [facility]  PHQ-2 Total Score 0          11/15/2022    3:39 PM 11/15/2022    2:32 PM 11/11/2021    9:40 AM 11/23/2020    1:14 PM 09/01/2020    3:29 PM  Fall Risk   Falls in the past year? 0 0 0 1 0  Number falls in past yr:  0  0   Injury with Fall?  0  0   Risk for fall due to :  No Fall Risks  Medication side effect Impaired mobility  Follow up  Falls prevention discussed;Falls evaluation completed  Falls evaluation completed;Falls prevention discussed Falls prevention discussed;Education provided   COPD - seeing Dr Patsey Berthold on breztri with benefit.  CAD - last saw cards 06/2022.   Recurrent left breast ulcers - new one 2 wks ago.  She has increased torsemide to 20mg  daily with benefit.   Preventative: COLONOSCOPY Date: 12/2011 hyperplastic polyp x2,. diverticulosis, rec rpt 10 yrs [facility] Plan) - discussed options, she requests referral to [facility] due to location.  Mammogram 11/2020 [facility]  [facility] - pending rpt, previously postponed due to poorly healing breast ulcer - now with left breast ulcer that recurred 2 wks ago - treating with honey cream. She does breast exams at home.  Well woman - pap 2015 WNL. S/p total hysterectomy with bilat oophorectomy (fibroids) but cervix remains. Bladder sling placed at that time (2014). Always normal pap smears. We attempted pap smear 2019 unsuccessfully - pt declines  repeat.  Lung cancer screening - started 03/2020, none since DEXA 04/2021 - WNL Flu yearly Pneumovax 2012, prevnar 2019  Td 2012  COVID vaccine - Moderna 09/2019, 10/2019, booster 06/2020, 12/2020, bivalent 06/2021, again 06/2022 RSV 06/2022 Zostavax - 2015 Shingrix - 10/2017, 12/2017 Advanced directives: does not have set in place.  Packet provided previously. Husband would be HCPOA.  Seat belt use discussed  Sunscreen use discussed. No changing moles.  Quit smoking 2011 (1-2 ppd) 30+ PY hx.  Alcohol - none Dentist - bought dentures but couldn't wear due to gag reflex Eye exam - yearly has appt 01/2023 Bowel - no constipation  Bladder - no incontinence   Caffeine: 2 coffee/am Lives with husband, 1 dog, 2 cats Occupation: disability since 2009 for bipolar, previously worked at [employer] Activity: tries to do arm exercises twice a week.  Diet: not many fruits, good vegetables, good amt water      Relevant past medical, surgical, family and social history reviewed and updated as indicated. Interim medical history since our last visit reviewed. Allergies and medications reviewed and updated. Outpatient Medications Prior to Visit  Medication Sig Dispense Refill   acetaminophen (TYLENOL 8 HOUR ARTHRITIS PAIN) 650 MG CR tablet Take 650  mg by mouth every 8 (eight) hours as needed for pain.     albuterol (VENTOLIN HFA) 108 (90 Base) MCG/ACT inhaler Inhale 2 puffs into the lungs every 6 (six) hours as needed for wheezing or shortness of breath. 18 g 3   aspirin 81 MG EC tablet Take 1 tablet (81 mg total) by mouth daily.     aspirin-acetaminophen-caffeine (EXCEDRIN MIGRAINE) 250-250-65 MG tablet Take by mouth every 6 (six) hours as needed for headache.     Blood Glucose Monitoring Suppl (ONETOUCH VERIO) w/Device KIT Use as instructed to check blood sugar 2 times a day 1 kit 0   Boswellia-Glucosamine-Vit D (OSTEO BI-FLEX ONE PER DAY PO) Take 1 tablet by mouth daily.     Budeson-Glycopyrrol-Formoterol  (BREZTRI AEROSPHERE) 160-9-4.8 MCG/ACT AERO Inhale 2 puffs into the lungs in the morning and at bedtime. 5.9 g 0   Calcium Carb-Cholecalciferol (CALCIUM + VITAMIN D3 PO) Take by mouth daily.     cyclobenzaprine (FLEXERIL) 10 MG tablet TAKE 1/2 TO 1 TABLET BY MOUTH TWICE A DAY AS NEEDED FOR MUSCLE SPASMS AND MIGRAINE 30 tablet 3   famotidine (PEPCID) 20 MG tablet Take 20 mg by mouth every other day. Alternates with omeprazole     gabapentin (NEURONTIN) 100 MG capsule TAKE 1 TO 3 CAPSULES AT    BEDTIME 200 capsule 1   glucose blood (ONETOUCH VERIO) test strip Use as instructed to check blood sugar 2 times a day 200 each 4   IBUPROFEN PO Take by mouth as needed.     Lancets (ONETOUCH DELICA PLUS 123XX123) MISC Use as instructed to check blood sugar 2 times a day 200 each 3   levothyroxine (SYNTHROID) 100 MCG tablet TAKE 1 TABLET DAILY, AND ON2 DAYS A WEEK, TAKE AN     EXTRA 1/2 TABLET 90 tablet 20   loratadine (CLARITIN) 10 MG tablet Take 1 tablet (10 mg total) by mouth daily.     mometasone (ELOCON) 0.1 % cream Apply 1 Application topically daily as needed (Rash). 45 g 1   Multiple Vitamin (MULTIVITAMIN WITH MINERALS) TABS tablet Take 1 tablet by mouth daily. One A Day for Women     mupirocin ointment (BACTROBAN) 2 % Apply 1 Application topically 2 (two) times daily. 15 g 3   omeprazole (PRILOSEC) 20 MG capsule Take 1 capsule (20 mg total) by mouth daily. 90 capsule 0   OXYGEN Inhale 2 L into the lungs at bedtime.     Rhubarb (ESTROVEN COMPLETE PO) Take 1 tablet by mouth daily.     Semaglutide,0.25 or 0.5MG /DOS, (OZEMPIC, 0.25 OR 0.5 MG/DOSE,) 2 MG/1.5ML SOPN Inject 0.5 mg into the skin once a week. Via [employer] PAP     torsemide (DEMADEX) 20 MG tablet TAKE 0.5 TABLETS (10 MG TOTAL) BY MOUTH AS DIRECTED. TAKE ONE HALF TABLET (10 MG) ONCE A DAY AND MAY TAKE ADDITIONAL ONE HALF TABLET (10 MG) AS NEEDED FOR WEIGHT GAIN OR SHORTNESS OF BREATH. 90 tablet 0   traMADol (ULTRAM) 50 MG tablet TAKE 1 TABLET  (50 MG TOTAL) BY MOUTH TWICE A DAY AS NEEDED FOR MODERATE PAIN 30 tablet 1   Blood Glucose Monitoring Suppl (ONE TOUCH ULTRA 2) w/Device KIT Use as instructed to check blood sugar 2 times a day. 1 kit 0   glucose blood (ONETOUCH ULTRA) test strip Use as instructed to check blood sugar 2 times a day 200 strip 3   metFORMIN (GLUCOPHAGE) 500 MG tablet TAKE 1 TABLET DAILY WITH  BREAKFAST AND 2 TABLETS    EVERY EVENING. NEW         DIRECTIONS 270 tablet 0   metoprolol succinate (TOPROL-XL) 25 MG 24 hr tablet Take 1 tablet (25 mg total) by mouth daily. 90 tablet 1   simvastatin (ZOCOR) 40 MG tablet Take 1 tablet (40 mg total) by mouth at bedtime. 90 tablet 0   ziprasidone (GEODON) 40 MG capsule TAKE 1 CAPSULE EVERY       MORNING AND AT BEDTIME 180 capsule 1   isosorbide mononitrate (IMDUR) 60 MG 24 hr tablet Take 0.5 tablets (30 mg total) by mouth 2 (two) times daily. 90 tablet 3   Apple Cider Vinegar 600 MG CAPS  (Patient not taking: Reported on 11/15/2022)     cholecalciferol (VITAMIN D3) 25 MCG (1000 UNIT) tablet Take 1,000 Units by mouth daily.     vitamin B-12 (CYANOCOBALAMIN) 1000 MCG tablet Take 1,000 mcg by mouth daily. (Patient not taking: Reported on 11/15/2022)     No facility-administered medications prior to visit.     Per HPI unless specifically indicated in ROS section below Review of Systems  Constitutional:  Negative for activity change, appetite change, chills, fatigue, fever and unexpected weight change.  HENT:  Negative for hearing loss.   Eyes:  Negative for visual disturbance.  Respiratory:  Negative for cough, chest tightness, shortness of breath and wheezing.   Cardiovascular:  Negative for chest pain, palpitations and leg swelling.  Gastrointestinal:  Negative for abdominal distention, abdominal pain, blood in stool, constipation, diarrhea, nausea and vomiting.  Genitourinary:  Negative for difficulty urinating and hematuria.  Musculoskeletal:  Negative for arthralgias,  myalgias and neck pain.  Skin:  Negative for rash.  Neurological:  Negative for dizziness, seizures, syncope and headaches.  Hematological:  Negative for adenopathy. Does not bruise/bleed easily.  Psychiatric/Behavioral:  Negative for dysphoric mood. The patient is not nervous/anxious.     Objective:  BP 122/64   Pulse 65   Temp (!) 97.2 F (36.2 C) (Temporal)   Ht 5\' 1"  (1.549 m)   Wt 225 lb 2 oz (102.1 kg)   SpO2 95%   BMI 42.54 kg/m   Wt Readings from Last 3 Encounters:  11/15/22 225 lb 2 oz (102.1 kg)  11/15/22 225 lb 12.8 oz (102.4 kg)  08/15/22 212 lb 9.6 oz (96.4 kg)      Physical Exam Vitals and nursing note reviewed.  Constitutional:      Appearance: Normal appearance. She is not ill-appearing.     Comments: Ambulates with walker  HENT:     Head: Normocephalic and atraumatic.     Right Ear: Tympanic membrane, ear canal and external ear normal. There is no impacted cerumen.     Left Ear: Tympanic membrane, ear canal and external ear normal. There is no impacted cerumen.     Mouth/Throat:     Mouth: Mucous membranes are moist.     Pharynx: Oropharynx is clear. No oropharyngeal exudate or posterior oropharyngeal erythema.  Eyes:     General:        Right eye: No discharge.        Left eye: No discharge.     Extraocular Movements: Extraocular movements intact.     Conjunctiva/sclera: Conjunctivae normal.     Pupils: Pupils are equal, round, and reactive to light.  Neck:     Thyroid: No thyroid mass or thyromegaly.     Vascular: No carotid bruit.  Cardiovascular:     Rate and  Rhythm: Normal rate and regular rhythm.     Pulses: Normal pulses.     Heart sounds: Normal heart sounds. No murmur heard. Pulmonary:     Effort: Pulmonary effort is normal. No respiratory distress.     Breath sounds: Normal breath sounds. No wheezing, rhonchi or rales.  Abdominal:     General: Bowel sounds are normal. There is no distension.     Palpations: Abdomen is soft. There is no  mass.     Tenderness: There is no abdominal tenderness. There is no guarding or rebound.     Hernia: No hernia is present.  Musculoskeletal:     Cervical back: Normal range of motion and neck supple. No rigidity.     Right lower leg: No edema.     Left lower leg: No edema.  Lymphadenopathy:     Cervical: No cervical adenopathy.  Skin:    General: Skin is warm and dry.     Findings: No rash.  Neurological:     General: No focal deficit present.     Mental Status: She is alert. Mental status is at baseline.  Psychiatric:        Mood and Affect: Mood normal.        Behavior: Behavior normal.       Results for orders placed or performed in visit on 11/08/22  Folate  Result Value Ref Range   Folate >23.8 >5.9 ng/mL  IBC panel  Result Value Ref Range   Iron 47 42 - 145 ug/dL   Transferrin 320.0 212.0 - 360.0 mg/dL   Saturation Ratios 10.5 (L) 20.0 - 50.0 %   TIBC 448.0 250.0 - 450.0 mcg/dL  Ferritin  Result Value Ref Range   Ferritin 6.5 (L) 10.0 - 291.0 ng/mL  Vitamin B12  Result Value Ref Range   Vitamin B-12 >1500 (H) 211 - 911 pg/mL  CBC with Differential/Platelet  Result Value Ref Range   WBC 10.4 4.0 - 10.5 K/uL   RBC 4.20 3.87 - 5.11 Mil/uL   Hemoglobin 12.2 12.0 - 15.0 g/dL   HCT 36.7 36.0 - 46.0 %   MCV 87.4 78.0 - 100.0 fl   MCHC 33.1 30.0 - 36.0 g/dL   RDW 14.6 11.5 - 15.5 %   Platelets 432.0 (H) 150.0 - 400.0 K/uL   Neutrophils Relative % 68.0 43.0 - 77.0 %   Lymphocytes Relative 24.1 12.0 - 46.0 %   Monocytes Relative 5.9 3.0 - 12.0 %   Eosinophils Relative 1.1 0.0 - 5.0 %   Basophils Relative 0.9 0.0 - 3.0 %   Neutro Abs 7.0 1.4 - 7.7 K/uL   Lymphs Abs 2.5 0.7 - 4.0 K/uL   Monocytes Absolute 0.6 0.1 - 1.0 K/uL   Eosinophils Absolute 0.1 0.0 - 0.7 K/uL   Basophils Absolute 0.1 0.0 - 0.1 K/uL  TSH  Result Value Ref Range   TSH 3.01 0.35 - 5.50 uIU/mL  Microalbumin / creatinine urine ratio  Result Value Ref Range   Microalb, Ur <0.7 0.0 - 1.9 mg/dL    Creatinine,U 27.7 mg/dL   Microalb Creat Ratio 2.5 0.0 - 30.0 mg/g  Hemoglobin A1c  Result Value Ref Range   Hgb A1c MFr Bld 6.5 4.6 - 6.5 %  Comprehensive metabolic panel  Result Value Ref Range   Sodium 140 135 - 145 mEq/L   Potassium 4.2 3.5 - 5.1 mEq/L   Chloride 102 96 - 112 mEq/L   CO2 27 19 - 32 mEq/L  Glucose, Bld 116 (H) 70 - 99 mg/dL   BUN 10 6 - 23 mg/dL   Creatinine, Ser 0.82 0.40 - 1.20 mg/dL   Total Bilirubin 0.2 0.2 - 1.2 mg/dL   Alkaline Phosphatase 52 39 - 117 U/L   AST 15 0 - 37 U/L   ALT 14 0 - 35 U/L   Total Protein 7.2 6.0 - 8.3 g/dL   Albumin 4.2 3.5 - 5.2 g/dL   GFR 72.61 >60.00 mL/min   Calcium 9.3 8.4 - 10.5 mg/dL  Lipid panel  Result Value Ref Range   Cholesterol 147 0 - 200 mg/dL   Triglycerides 364.0 (H) 0.0 - 149.0 mg/dL   HDL 44.30 >39.00 mg/dL   VLDL 72.8 (H) 0.0 - 40.0 mg/dL   Total CHOL/HDL Ratio 3    NonHDL 102.67   LDL cholesterol, direct  Result Value Ref Range   Direct LDL 54.0 mg/dL    Assessment & Plan:   Problem List Items Addressed This Visit     Advanced care planning/counseling discussion (Chronic)    Previously discussed - needs to complete.       Health maintenance examination - Primary (Chronic)    Preventative protocols reviewed and updated unless pt declined. Discussed healthy diet and lifestyle.       Type 2 diabetes mellitus with other specified complication (HCC)    Chronic, A1c trending up however remains well controlled on current regimen of metformin 500mg  bid and ozempic 0.5mg  weekly - continue this.       Relevant Medications   Blood Glucose Monitoring Suppl (ONETOUCH VERIO) w/Device KIT   glucose blood (ONETOUCH VERIO) test strip   metFORMIN (GLUCOPHAGE) 500 MG tablet   simvastatin (ZOCOR) 40 MG tablet   CAD (coronary artery disease)    Continue cardiology f/u      Relevant Medications   metoprolol succinate (TOPROL-XL) 25 MG 24 hr tablet   simvastatin (ZOCOR) 40 MG tablet   Hypothyroidism     Chronic, thyroid levels stable on current regimen - continue.       Relevant Medications   metoprolol succinate (TOPROL-XL) 25 MG 24 hr tablet   Hypertension    Chronic, stable.       Relevant Medications   metoprolol succinate (TOPROL-XL) 25 MG 24 hr tablet   simvastatin (ZOCOR) 40 MG tablet   Bipolar disorder (HCC)    Chronic, stable period on geodon 40mg  BID - managed by PCP given longterm stability on this regimen.       Hyperlipidemia associated with type 2 diabetes mellitus (HCC)    Chronic, remains on simvastatin 40mg  daily with good LDL control - however triglycerides markedly elevated possibly due to relative worse hyperglycemia. Continue to monitor. She doesn't get fatty fish in diet.  The 10-year ASCVD risk score (Arnett DK, et al., 2019) is: 20.1%   Values used to calculate the score:     Age: 56 years     Sex: Female     Is Non-Hispanic African American: No     Diabetic: Yes     Tobacco smoker: No     Systolic Blood Pressure: 123XX123 mmHg     Is BP treated: Yes     HDL Cholesterol: 44.3 mg/dL     Total Cholesterol: 147 mg/dL       Relevant Medications   metFORMIN (GLUCOPHAGE) 500 MG tablet   metoprolol succinate (TOPROL-XL) 25 MG 24 hr tablet   simvastatin (ZOCOR) 40 MG tablet   GERD (gastroesophageal reflux disease)  Stable period managed with omeprazole 20mg /pepcid 20mg  alternating daily.       Obesity, morbid, BMI 40.0-49.9 ([place])    Weight gain noted - see subsequent mychart message.  This is despite increased torsemide and continued ozempic.       Relevant Medications   metFORMIN (GLUCOPHAGE) 500 MG tablet   Thrombocytosis    Chronic, mildly elevated.       Cervical neck pain with evidence of disc disease   Osteoarthritis   COPD (chronic obstructive pulmonary disease) (HCC)    Appreciate pulm care- continues breztri 2 puffs BID.       S/P CABG x 1   Skin lesion of breast    Recurrent skin sores to left medial breast, latest one present for the  past 2 weeks, she's been treating with OTC honey strips with benefit. She has previously seen wound clinic for these, she has previously seen dermatologist as well. She has had to postpone mammogram due to this. Continue treatment as up to now. Consider mupirocin, consider testing for MRSA colonization.       Elevated vitamin B12 level    Levels now high on daily b12 replacement - rec drop to once weekly.       Iron deficiency    %sat and ferritin low without anemia. Suggested start weekly oral iron replacement.         Meds ordered this encounter  Medications   Iron, Ferrous Sulfate, 325 (65 Fe) MG TABS    Sig: Take 325 mg by mouth once a week.   metFORMIN (GLUCOPHAGE) 500 MG tablet    Sig: Take 1 tablet (500 mg total) by mouth 2 (two) times daily with a meal.    Dispense:  180 tablet    Refill:  4   metoprolol succinate (TOPROL-XL) 25 MG 24 hr tablet    Sig: Take 1 tablet (25 mg total) by mouth daily.    Dispense:  90 tablet    Refill:  4   simvastatin (ZOCOR) 40 MG tablet    Sig: Take 1 tablet (40 mg total) by mouth at bedtime.    Dispense:  90 tablet    Refill:  4   DISCONTD: cyanocobalamin (VITAMIN B12) 1000 MCG tablet    Sig: Take 1 tablet (1,000 mcg total) by mouth once a week.   ziprasidone (GEODON) 40 MG capsule    Sig: Take 1 capsule (40 mg total) by mouth 2 (two) times daily with a meal.    Dispense:  180 capsule    Refill:  4    No orders of the defined types were placed in this encounter.   Patient Instructions  Start oral iron once weekly - ferrous sulfate 325mg  (65FE) Stay off b12 replacement at this time.  Good to see you today Return in 4 months for diabetes follow up visit   Follow up plan: Return in about 4 months (around 03/17/2023) for follow up visit.  Ria Bush, MD

## 2022-11-15 NOTE — Patient Instructions (Addendum)
Start oral iron once weekly - ferrous sulfate 325mg  (65FE) Stay off b12 replacement at this time.  Good to see you today Return in 4 months for diabetes follow up visit

## 2022-11-15 NOTE — Progress Notes (Signed)
Subjective:   [name] is a 72 y.o. female who presents for Medicare Annual (Subsequent) preventive examination.  Review of Systems      Cardiac Risk Factors include: advanced age (>64men, >70 women);hypertension;diabetes mellitus;sedentary lifestyle     Objective:    Today's Vitals   11/15/22 1405 11/15/22 1419  BP: 118/60   Pulse: 96   Resp: 17   SpO2: 98%   Weight: 225 lb 12.8 oz (102.4 kg)   Height: 5\' 2"  (1.575 m)   PainSc:  7    Body mass index is 41.3 kg/m.     11/15/2022    2:31 PM 02/23/2022    8:09 AM 11/23/2020    1:13 PM 10/27/2020    9:56 AM 09/01/2020    3:49 PM 08/04/2020    8:00 PM 07/30/2020   11:15 AM  Advanced Directives  Does Patient Have a Medical Advance Directive? No No No No No No No  Type of Advance Directive      Healthcare Power of Attorney   Would patient like information on creating a medical advance directive? No - Patient declined No - Patient declined No - Patient declined  No - Patient declined No - Patient declined No - Patient declined    Current Medications (verified) Outpatient Encounter Medications as of 11/15/2022  Medication Sig   acetaminophen (TYLENOL 8 HOUR ARTHRITIS PAIN) 650 MG CR tablet Take 650 mg by mouth every 8 (eight) hours as needed for pain.   albuterol (VENTOLIN HFA) 108 (90 Base) MCG/ACT inhaler Inhale 2 puffs into the lungs every 6 (six) hours as needed for wheezing or shortness of breath.   aspirin 81 MG EC tablet Take 1 tablet (81 mg total) by mouth daily.   aspirin-acetaminophen-caffeine (EXCEDRIN MIGRAINE) 250-250-65 MG tablet Take by mouth every 6 (six) hours as needed for headache.   Blood Glucose Monitoring Suppl (ONE TOUCH ULTRA 2) w/Device KIT Use as instructed to check blood sugar 2 times a day.   Boswellia-Glucosamine-Vit D (OSTEO BI-FLEX ONE PER DAY PO) Take 1 tablet by mouth daily.   Budeson-Glycopyrrol-Formoterol (BREZTRI AEROSPHERE) 160-9-4.8 MCG/ACT AERO Inhale 2 puffs into the lungs in the  morning and at bedtime.   cyclobenzaprine (FLEXERIL) 10 MG tablet TAKE 1/2 TO 1 TABLET BY MOUTH TWICE A DAY AS NEEDED FOR MUSCLE SPASMS AND MIGRAINE   famotidine (PEPCID) 20 MG tablet Take 20 mg by mouth every other day. Alternates with omeprazole   gabapentin (NEURONTIN) 100 MG capsule TAKE 1 TO 3 CAPSULES AT    BEDTIME   glucose blood (ONETOUCH ULTRA) test strip Use as instructed to check blood sugar 2 times a day   IBUPROFEN PO Take by mouth as needed.   Lancets (ONETOUCH DELICA PLUS 123XX123) MISC Use as instructed to check blood sugar 2 times a day   levothyroxine (SYNTHROID) 100 MCG tablet TAKE 1 TABLET DAILY, AND ON2 DAYS A WEEK, TAKE AN     EXTRA 1/2 TABLET   loratadine (CLARITIN) 10 MG tablet Take 1 tablet (10 mg total) by mouth daily.   metFORMIN (GLUCOPHAGE) 500 MG tablet TAKE 1 TABLET DAILY WITH   BREAKFAST AND 2 TABLETS    EVERY EVENING. NEW         DIRECTIONS   metoprolol succinate (TOPROL-XL) 25 MG 24 hr tablet Take 1 tablet (25 mg total) by mouth daily.   Multiple Vitamin (MULTIVITAMIN WITH MINERALS) TABS tablet Take 1 tablet by mouth daily. One A Day for Women   mupirocin  ointment (BACTROBAN) 2 % Apply 1 Application topically 2 (two) times daily.   omeprazole (PRILOSEC) 20 MG capsule Take 1 capsule (20 mg total) by mouth daily.   OXYGEN Inhale 2 L into the lungs at bedtime.   Rhubarb (ESTROVEN COMPLETE PO) Take 1 tablet by mouth daily.   Semaglutide,0.25 or 0.5MG /DOS, (OZEMPIC, 0.25 OR 0.5 MG/DOSE,) 2 MG/1.5ML SOPN Inject 0.5 mg into the skin once a week. Via Novo Cares PAP   simvastatin (ZOCOR) 40 MG tablet Take 1 tablet (40 mg total) by mouth at bedtime.   torsemide (DEMADEX) 20 MG tablet TAKE 0.5 TABLETS (10 MG TOTAL) BY MOUTH AS DIRECTED. TAKE ONE HALF TABLET (10 MG) ONCE A DAY AND MAY TAKE ADDITIONAL ONE HALF TABLET (10 MG) AS NEEDED FOR WEIGHT GAIN OR SHORTNESS OF BREATH.   traMADol (ULTRAM) 50 MG tablet TAKE 1 TABLET (50 MG TOTAL) BY MOUTH TWICE A DAY AS NEEDED FOR  MODERATE PAIN   ziprasidone (GEODON) 40 MG capsule TAKE 1 CAPSULE EVERY       MORNING AND AT BEDTIME   Apple Cider Vinegar 600 MG CAPS  (Patient not taking: Reported on 11/15/2022)   cholecalciferol (VITAMIN D3) 25 MCG (1000 UNIT) tablet Take 1,000 Units by mouth daily. (Patient not taking: Reported on 11/15/2022)   isosorbide mononitrate (IMDUR) 60 MG 24 hr tablet Take 0.5 tablets (30 mg total) by mouth 2 (two) times daily.   mometasone (ELOCON) 0.1 % cream Apply 1 Application topically daily as needed (Rash). (Patient not taking: Reported on 11/15/2022)   vitamin B-12 (CYANOCOBALAMIN) 1000 MCG tablet Take 1,000 mcg by mouth daily. (Patient not taking: Reported on 11/15/2022)   No facility-administered encounter medications on file as of 11/15/2022.    Allergies (verified) Crestor [rosuvastatin], Lipitor [atorvastatin], Lithium, and Quetiapine   History: Past Medical History:  Diagnosis Date   Anxiety    Arthritis    per patient "all over body"   Bipolar disorder ([place])    has stopped seeing psychiatrist   CAD (coronary artery disease)    a. 06/2020 MV: small, sev, apical inf/apical defect-scar vs ischemia. EF 45%; b. 06/2020 Cath: LM 57m, LAD 70ost, 80m, LCX nl, RCA nl; c. 07/2020 CABG x 1: LIMA->LAD; d. 03/2022 MV: Significantly degraded imaging due to breast attenuation/obesity/extracardiac activity. No obvious isch/scar. EF 30-35% (EF 50-55% w/o rwma on f/u echo).   Cataract    Chronic bronchitis    COPD (chronic obstructive pulmonary disease) ([place])    Depression    Dyspnea 2012   s/p pulm/cards w/u WNL, thought due to obesity/deconditioning   Fibromyalgia    GERD (gastroesophageal reflux disease)    HFmrEF (heart failure with mid-range ejection fraction) ([place])    a. 05/2020 Echo: EF 40%, glob HK, mild LVH, Gr1 DD; b. 06/2020 EF by SPECT: 45%; c. 07/2020 intraop TEE: EF 50%; d. 03/2022 Echo: EF 50-55%, no rwma, GrI DD, mild LVH, nl RV fxn, mild MR.   History of chicken pox    HLD  (hyperlipidemia)    Hypertension    Hypothyroidism    Ischemic cardiomyopathy    a. 05/2020 Echo: EF 40%; b. 06/2020 EF by SPECT: 45%; c. 07/2020 intraop TEE: EF 50%.   Migraine    OSA (obstructive sleep apnea) 03/16/2011   Pneumonia    Sleep apnea    per patient cannot tolerate CPAP   T2DM (type 2 diabetes mellitus) ([place]) 2012   DM education 06/2011   Urinary incontinence    Past Surgical History:  Procedure Laterality Date   BREAST CYST ASPIRATION Right 2003   cardiac catherization  03/2011   x3 Glennie Hawk), mild nonobstructive CAD   COLONOSCOPY  12/2011   hyperplastic polyp x2,. diverticulosis, rec rpt 10 yrs   CORONARY ARTERY BYPASS GRAFT N/A 08/03/2020   Procedure: CORONARY ARTERY BYPASS GRAFTING TIMES ONE USING LEFT INTERNAL MAMMARY ARTERY.;  Surgeon: Gaye Pollack, MD;  Location: [facility] OR;  Service: Open Heart Surgery;  Laterality: N/A;   ESOPHAGOGASTRODUODENOSCOPY  2006   ESOPHAGOGASTRODUODENOSCOPY  05/2020   no esophageal abnormality to explain dysphagia - esophagus dilated. Erosive gastropathy - neg H pylori [facility])   KNEE SURGERY  2006   left, torn meniscus   RIGHT/LEFT HEART CATH AND CORONARY ANGIOGRAPHY N/A 07/13/2020   Procedure: RIGHT/LEFT HEART CATH AND CORONARY ANGIOGRAPHY;  Surgeon: Nelva Bush, MD;  Location: [facility];  Service: Cardiovascular;  Laterality: N/A;   TEE WITHOUT CARDIOVERSION N/A 08/03/2020   Procedure: TRANSESOPHAGEAL ECHOCARDIOGRAM (TEE);  Surgeon: Gaye Pollack, MD;  Location: [facility];  Service: Open Heart Surgery;  Laterality: N/A;   TOTAL ABDOMINAL HYSTERECTOMY  2004   fibroids, cervix remained   TOTAL KNEE ARTHROPLASTY Left 08/2012   [name], [facility]   Family History  Problem Relation Age of Onset   Asthma Maternal Uncle    Cancer Maternal Uncle        colon   Aneurysm Maternal Uncle    Colon cancer Maternal Uncle 44   Hypertension Mother    Stroke Mother    Arthritis Mother    COPD Sister    Acute lymphoblastic  leukemia Cousin 3       died at age 32   Cancer Father 65       brain tumor   Diabetes Father    Thyroid disease Father    Cancer Maternal Aunt 20       breast   Breast cancer Maternal Aunt    Cancer Paternal Uncle        prostate   Coronary artery disease Neg Hx    Stomach cancer Neg Hx    Rectal cancer Neg Hx    Esophageal cancer Neg Hx    Social History   Socioeconomic History   Marital status: Married    Spouse name: Not on file   Number of children: Not on file   Years of education: Not on file   Highest education level: Not on file  Occupational History   Occupation: disabled    Employer: OTHER  Tobacco Use   Smoking status: Former    Packs/day: 2.00    Years: 43.00    Additional pack years: 0.00    Total pack years: 86.00    Types: Cigarettes    Quit date: 05/17/2010    Years since quitting: 12.5   Smokeless tobacco: Never   Tobacco comments:    QUIT 2011  Vaping Use   Vaping Use: Never used  Substance and Sexual Activity   Alcohol use: No    Alcohol/week: 0.0 standard drinks of alcohol   Drug use: No   Sexual activity: Yes    Partners: Male  Other Topics Concern   Not on file  Social History Narrative   Caffeine: 2-3 coffee/am, 1 cup tea in afternoon; Lives with husband, 1 dog, 2 cats; Occupation: disability since 2009 for bipolar, previously worked at [employer]; Diet: not many fruits, good vegetables, good amt water.      Smoker 43 years; quit in 2011. No alcohol.  Lives in [place]; with husband.    Social Determinants of Health   Financial Resource Strain: Low Risk  (11/15/2022)   Overall Financial Resource Strain (CARDIA)    Difficulty of Paying Living Expenses: Not hard at all  Food Insecurity: No Food Insecurity (11/15/2022)   Hunger Vital Sign    Worried About Running Out of Food in the Last Year: Never true    Ran Out of Food in the Last Year: Never true  Transportation Needs: No Transportation Needs (11/15/2022)   PRAPARE - Armed forces logistics/support/administrative officer (Medical): No    Lack of Transportation (Non-Medical): No  Physical Activity: Insufficiently Active (11/15/2022)   Exercise Vital Sign    Days of Exercise per Week: 3 days    Minutes of Exercise per Session: 20 min  Stress: No Stress Concern Present (11/15/2022)   [place]    Feeling of Stress : Not at all  Social Connections: Moderately Isolated (11/15/2022)   Social Connection and Isolation Panel [NHANES]    Frequency of Communication with Friends and Family: More than three times a week    Frequency of Social Gatherings with Friends and Family: More than three times a week    Attends Religious Services: Never    Marine scientist or Organizations: No    Attends Archivist Meetings: Never    Marital Status: Married    Tobacco Counseling Counseling given: Not Answered Tobacco comments: QUIT 2011   Clinical Intake:  Pre-visit preparation completed: Yes  Pain : 0-10 Pain Score: 7  Pain Location: Knee Pain Orientation: Right Pain Descriptors / Indicators: Aching (Followed by [facility].) Pain Onset: More than a month ago     Nutritional Risks: Unintentional weight gain, Non-healing wound (gained 13lbs sin 10/02/22, has wounds on left breast that are slow to heal.) Diabetes: Yes CBG done?: No Did pt. bring in CBG monitor from home?: No  How often do you need to have someone help you when you read instructions, pamphlets, or other written materials from your doctor or pharmacy?: 1 - Never  Diabetic?Nutrition Risk Assessment:  Has the patient had any N/V/D within the last 2 months?  No  Does the patient have any non-healing wounds?   Things that take longer to heal. Has the patient had any unintentional weight loss or weight gain?  Yes  13lbs since 10/02/22.  Diabetes:  Is the patient diabetic?  Yes  If diabetic, was a CBG obtained today?  Yes , 132 today Did the  patient bring in their glucometer from home?  No  How often do you monitor your CBG's? BID.   Financial Strains and Diabetes Management:  Are you having any financial strains with the device, your supplies or your medication? No .  Does the patient want to be seen by Chronic Care Management for management of their diabetes?  No  Would the patient like to be referred to a Nutritionist or for Diabetic Management?  No   Diabetic Exams:  Diabetic Eye Exam: Completed 02/14/22 [facility] Diabetic Foot Exam: Completed 02/13/22 PCP    Interpreter Needed?: No  Information entered by :: C.Johnhenry Tippin LPN   Activities of Daily Living    11/15/2022    2:33 PM  In your present state of health, do you have any difficulty performing the following activities:  Hearing? 0  Vision? 0  Difficulty concentrating or making decisions? 0  Walking or climbing stairs?  1  Comment Right knee pain  Dressing or bathing? 0  Doing errands, shopping? 0  Preparing Food and eating ? N  Using the Toilet? N  In the past six months, have you accidently leaked urine? Y  Comment Occasional  Do you have problems with loss of bowel control? N  Managing your Medications? N  Managing your Finances? N  Housekeeping or managing your Housekeeping? N    Patient Care Team: Ria Bush, MD as PCP - General (Family Medicine) Kate Sable, MD as PCP - Cardiology (Cardiology) Bryson Ha, OD as Consulting Physician (Optometry) Charlton Haws, [facility] as Pharmacist (Pharmacist) Tyler Pita, MD as Consulting Physician (Pulmonary Disease)  Indicate any recent Medical Services you may have received from other than [facility] providers in the past year (date may be approximate).     Assessment:   This is a routine wellness examination for [place].  Hearing/Vision screen Hearing Screening - Comments:: No aids Vision Screening - Comments:: Glasses - [facility]  Dietary issues and exercise activities  discussed: Current Exercise Habits: Home exercise routine, Type of exercise: stretching, Time (Minutes): 20, Frequency (Times/Week): 3, Weekly Exercise (Minutes/Week): 60, Intensity: Mild, Exercise limited by: orthopedic condition(s) (right knee, followed by [facility])   Goals Addressed             This Visit's Progress    Patient Stated       No new goals.       Depression Screen    11/15/2022    2:30 PM 11/11/2021   10:42 AM 11/23/2020    1:16 PM 10/11/2020    4:17 PM 05/26/2020   11:21 AM 05/26/2020   10:22 AM 04/24/2019   11:31 AM  PHQ 2/9 Scores  PHQ - 2 Score 0 0 0 1 0 0 0  PHQ- 9 Score  1 0 5 0      Fall Risk    11/15/2022    2:32 PM 11/11/2021    9:40 AM 11/23/2020    1:14 PM 09/01/2020    3:29 PM 05/26/2020   10:22 AM  [place] in the past year? 0 0 1 0 0  Number falls in past yr: 0  0    Injury with Fall? 0  0    Risk for fall due to : No Fall Risks  Medication side effect Impaired mobility   Follow up Falls prevention discussed;Falls evaluation completed  Falls evaluation completed;Falls prevention discussed Falls prevention discussed;Education provided     FALL RISK PREVENTION PERTAINING TO THE HOME:  Any stairs in or around the home? No  If so, are there any without handrails? No  Home free of loose throw rugs in walkways, pet beds, electrical cords, etc? Yes  Adequate lighting in your home to reduce risk of falls? Yes   ASSISTIVE DEVICES UTILIZED TO PREVENT FALLS:  Life alert? No  Use of a cane, walker or w/c? Yes  Grab bars in the bathroom? Yes  Shower chair or bench in shower? Yes  Elevated toilet seat or a handicapped toilet? No   TIMED UP AND GO:  Was the test performed? Yes .  Length of time to ambulate 10 feet: 15 sec.   Gait steady and fast with assistive device  Cognitive Function:    11/23/2020    1:20 PM 04/15/2018   12:55 PM 04/10/2017    8:58 AM  MMSE - Mini Mental State Exam  Orientation to time 5 5 5  Orientation to  Place 5 5 5   Registration 3 3 3   Attention/ Calculation 5 0 0  Recall 3 3 1   Recall-comments   pt was unable to recall 1 of 3 words  Language- name 2 objects  0 0  Language- repeat 1 1 1   Language- follow 3 step command  3 3  Language- read & follow direction  0 0  Write a sentence  0 0  Copy design  0 0  Total score  20 18        11/15/2022    2:35 PM  6CIT Screen  What Year? 0 points  What month? 0 points  What time? 0 points  Count back from 20 0 points  Months in reverse 0 points  Repeat phrase 4 points  Total Score 4 points    Immunizations Immunization History  Administered Date(s) Administered   COVID-19, mRNA, vaccine(Comirnaty)12 years and older 07/15/2022   Fluad Quad(high Dose 65+) 04/24/2019, 05/26/2020, 05/16/2022   Influenza Split 05/09/2011, 06/11/2012   Influenza Whole 05/20/2010   Influenza, High Dose Seasonal PF 06/01/2021   Influenza,inj,Quad PF,6+ Mos 07/14/2013, 06/16/2014, 04/30/2015, 05/31/2016, 05/10/2017, 04/19/2018   Moderna Covid-19 Vaccine Bivalent Booster 74yrs & up 06/24/2021   Moderna SARS-COV2 Booster Vaccination 06/21/2020, 12/31/2020   Moderna Sars-Covid-2 Vaccination 10/18/2019, 11/15/2019   PFIZER Comirnaty(Gray Top)Covid-19 Tri-Sucrose Vaccine 07/15/2022   Pneumococcal Conjugate-13 11/14/2017, 04/19/2018   Pneumococcal Polysaccharide-23 04/18/2011, 06/01/2021   Respiratory Syncytial Virus Vaccine,Recomb Aduvanted(Arexvy) 07/15/2022   Td 04/18/2011   Zoster Recombinat (Shingrix) 11/14/2017, 01/02/2018   Zoster, Live 12/29/2013    TDAP status: Due, Education has been provided regarding the importance of this vaccine. Advised may receive this vaccine at local pharmacy or Health Dept. Aware to provide a copy of the vaccination record if obtained from local pharmacy or Health Dept. Verbalized acceptance and understanding.  Flu Vaccine status: Up to date  Pneumococcal vaccine status: Up to date  Covid-19 vaccine status: Information  provided on how to obtain vaccines.   Qualifies for Shingles Vaccine? Yes   Zostavax completed Yes   Shingrix Completed?: Yes  Screening Tests Health Maintenance  Topic Date Due   Lung Cancer Screening  04/13/2021   DTaP/Tdap/Td (2 - Tdap) 04/17/2021   COLONOSCOPY (Pts 45-12yrs Insurance coverage will need to be confirmed)  01/09/2022   COVID-19 Vaccine (5 - 2023-24 season) 09/09/2022   MAMMOGRAM  12/11/2022   FOOT EXAM  02/14/2023   OPHTHALMOLOGY EXAM  02/15/2023   HEMOGLOBIN A1C  05/11/2023   Diabetic kidney evaluation - eGFR measurement  11/08/2023   Diabetic kidney evaluation - Urine ACR  11/08/2023   Medicare Annual Wellness (AWV)  11/15/2023   Pneumonia Vaccine 15+ Years old  Completed   INFLUENZA VACCINE  Completed   DEXA SCAN  Completed   Hepatitis C Screening  Completed   Zoster Vaccines- Shingrix  Completed   HPV VACCINES  Aged Out    Health Maintenance  Health Maintenance Due  Topic Date Due   Lung Cancer Screening  04/13/2021   DTaP/Tdap/Td (2 - Tdap) 04/17/2021   COLONOSCOPY (Pts 45-64yrs Insurance coverage will need to be confirmed)  01/09/2022   COVID-19 Vaccine (5 - 2023-24 season) 09/09/2022    Colorectal cancer screening: Type of screening: Colonoscopy. Completed 01/10/12. Repeat every 10 years  Mammogram status: Completed 12/10/20. Repeat every year Pt Declined.  Bone Density status: Completed 05/10/21. Results reflect: Bone density results: NORMAL. Repeat every 5 years.  Lung Cancer Screening: (Low Dose CT Chest recommended  if Age 68-80 years, 62 pack-year currently smoking OR have quit w/in 15years.) does qualify.   Lung Cancer Screening Referral: Followed by pulmonologist.  Additional Screening:  Hepatitis C Screening: does qualify; Completed 01/02/18  Vision Screening: Recommended annual ophthalmology exams for early detection of glaucoma and other disorders of the eye. Is the patient up to date with their annual eye exam?  Yes  Who is the  provider or what is the name of the office in which the patient attends annual eye exams? [facility] If pt is not established with a provider, would they like to be referred to a provider to establish care? No .   Dental Screening: Recommended annual dental exams for proper oral hygiene  Community Resource Referral / Chronic Care Management: CRR required this visit?  No   CCM required this visit?  No      Plan:     I have personally reviewed and noted the following in the patient's chart:   Medical and social history Use of alcohol, tobacco or illicit drugs  Current medications and supplements including opioid prescriptions. Patient is currently taking opioid prescriptions. Information provided to patient regarding non-opioid alternatives. Patient advised to discuss non-opioid treatment plan with their provider. Functional ability and status Nutritional status Physical activity Advanced directives List of other physicians Hospitalizations, surgeries, and ER visits in previous 12 months Vitals Screenings to include cognitive, depression, and falls Referrals and appointments  In addition, I have reviewed and discussed with patient certain preventive protocols, quality metrics, and best practice recommendations. A written personalized care plan for preventive services as well as general preventive health recommendations were provided to patient.     Lebron Conners, LPN   [id number]   Nurse Notes: Pt declined mammogram due to sores on left breast. Order placed for colonoscopy.

## 2022-11-16 ENCOUNTER — Encounter: Payer: Self-pay | Admitting: Family Medicine

## 2022-11-17 ENCOUNTER — Encounter: Payer: Self-pay | Admitting: Family Medicine

## 2022-11-17 DIAGNOSIS — E611 Iron deficiency: Secondary | ICD-10-CM | POA: Insufficient documentation

## 2022-11-17 DIAGNOSIS — R748 Abnormal levels of other serum enzymes: Secondary | ICD-10-CM | POA: Insufficient documentation

## 2022-11-17 MED ORDER — SIMVASTATIN 40 MG PO TABS: 40.0000 mg | ORAL_TABLET | Freq: Every day | ORAL | 4 refills | Status: DC

## 2022-11-17 MED ORDER — METOPROLOL SUCCINATE ER 25 MG PO TB24: 25.0000 mg | ORAL_TABLET | Freq: Every day | ORAL | 4 refills | Status: DC

## 2022-11-17 MED ORDER — VITAMIN B-12 1000 MCG PO TABS: 1000.0000 ug | ORAL_TABLET | ORAL | Status: DC

## 2022-11-17 MED ORDER — ZIPRASIDONE HCL 40 MG PO CAPS: 40.0000 mg | ORAL_CAPSULE | Freq: Two times a day (BID) | ORAL | 4 refills | Status: DC

## 2022-11-17 NOTE — Assessment & Plan Note (Signed)
Chronic, remains on simvastatin 40mg  daily with good LDL control - however triglycerides markedly elevated possibly due to relative worse hyperglycemia. Continue to monitor. She doesn't get fatty fish in diet.  The 10-year ASCVD risk score (Arnett DK, et al., 2019) is: 20.1%   Values used to calculate the score:     Age: 72 years     Sex: Female     Is Non-Hispanic African American: No     Diabetic: Yes     Tobacco smoker: No     Systolic Blood Pressure: 123XX123 mmHg     Is BP treated: Yes     HDL Cholesterol: 44.3 mg/dL     Total Cholesterol: 147 mg/dL

## 2022-11-17 NOTE — Assessment & Plan Note (Signed)
Chronic, stable period on geodon 40mg  BID - managed by PCP given longterm stability on this regimen.

## 2022-11-17 NOTE — Assessment & Plan Note (Signed)
Previously discussed - needs to complete.

## 2022-11-17 NOTE — Assessment & Plan Note (Signed)
Preventative protocols reviewed and updated unless pt declined. Discussed healthy diet and lifestyle.  

## 2022-11-17 NOTE — Assessment & Plan Note (Signed)
Chronic, mildly elevated.

## 2022-11-17 NOTE — Assessment & Plan Note (Addendum)
Recurrent skin sores to left medial breast, latest one present for the past 2 weeks, she's been treating with OTC honey strips with benefit. She has previously seen wound clinic for these, she has previously seen dermatologist as well. She has had to postpone mammogram due to this. Continue treatment as up to now. Consider mupirocin, consider testing for MRSA colonization.

## 2022-11-17 NOTE — Assessment & Plan Note (Addendum)
Weight gain noted - see subsequent mychart message.  This is despite increased torsemide and continued ozempic.

## 2022-11-17 NOTE — Assessment & Plan Note (Addendum)
Chronic, thyroid levels stable on current regimen - continue.

## 2022-11-17 NOTE — Assessment & Plan Note (Signed)
Stable period managed with omeprazole 20mg /pepcid 20mg  alternating daily.

## 2022-11-17 NOTE — Assessment & Plan Note (Addendum)
Chronic, A1c trending up however remains well controlled on current regimen of metformin 500mg  bid and ozempic 0.5mg  weekly - continue this.

## 2022-11-17 NOTE — Assessment & Plan Note (Signed)
Levels now high on daily b12 replacement - rec drop to once weekly.

## 2022-11-17 NOTE — Assessment & Plan Note (Signed)
Continue cardiology f/u

## 2022-11-17 NOTE — Assessment & Plan Note (Signed)
%  sat and ferritin low without anemia. Suggested start weekly oral iron replacement.

## 2022-11-17 NOTE — Assessment & Plan Note (Signed)
Appreciate pulm care- continues breztri 2 puffs BID.

## 2022-11-17 NOTE — Assessment & Plan Note (Signed)
Chronic, stable 

## 2022-11-22 ENCOUNTER — Encounter: Payer: Self-pay | Admitting: *Deleted

## 2022-12-05 ENCOUNTER — Ambulatory Visit: Payer: Medicare HMO | Admitting: Pulmonary Disease

## 2022-12-05 ENCOUNTER — Encounter: Payer: Self-pay | Admitting: Pulmonary Disease

## 2022-12-05 VITALS — BP 132/70 | HR 87 | Temp 98.1°F | Ht 61.0 in | Wt 218.2 lb

## 2022-12-05 DIAGNOSIS — G4736 Sleep related hypoventilation in conditions classified elsewhere: Secondary | ICD-10-CM

## 2022-12-05 DIAGNOSIS — R0602 Shortness of breath: Secondary | ICD-10-CM | POA: Diagnosis not present

## 2022-12-05 DIAGNOSIS — E669 Obesity, unspecified: Secondary | ICD-10-CM

## 2022-12-05 DIAGNOSIS — R5381 Other malaise: Secondary | ICD-10-CM | POA: Diagnosis not present

## 2022-12-05 DIAGNOSIS — J449 Chronic obstructive pulmonary disease, unspecified: Secondary | ICD-10-CM | POA: Diagnosis not present

## 2022-12-05 DIAGNOSIS — Z87891 Personal history of nicotine dependence: Secondary | ICD-10-CM

## 2022-12-05 NOTE — Progress Notes (Unsigned)
Subjective:    Patient ID: [name], female    DOB: [birth year], 72 y.o.   MRN: [id number] Patient Care Team: Eustaquio Boyden, MD as PCP - General (Family Medicine) Debbe Odea, MD as PCP - Cardiology (Cardiology) Carrie Mew, OD as Consulting Physician (Optometry) Kathyrn Sheriff, [facility] as Pharmacist (Pharmacist) Salena Saner, MD as Consulting Physician (Pulmonary Disease)  Chief Complaint  Patient presents with   Follow-up    SOB constant for months.  No wheezing or cough.   HPI [name] is a 72 year old former smoker with an 86-pack-year history of smoking who presents for follow-up on the issue of dyspnea and COPD.  COPD has been noted to be mild by PFTs.  Ever has not had PFTs since 2021.  Her dyspnea has been out of proportion to the PFTs noted.  She has significant issues with obesity and deconditioning.  More recently she has had to take care of her ill husband and this has also added to her fatigue during the day.  The patient was eventually noted to have significant heart disease and actually required coronary artery bypass grafting.  This was done in December 2021.  The patient's dyspnea improved after this however, her fatigue has always persisted.  She is on nocturnal oxygen for hypoxia related to COPD.  She has only been noted to have very mild sleep apnea which responds to positional change so no equipment has been recommended.  She has not had any issues with wheezing or cough.  She is compliant with her nocturnal oxygen at 2 L/min.  She is compliant with Breztri 2 puffs twice a day and uses albuterol an average of 2 times per week.  She has not had any fevers, chills or sweats.  No cough or sputum production.  She presents today in a transport chair due to difficulties ambulating from knee pain.  We discussed pulmonary rehab again but she declines this due to her issues with her knees.  She has been lost to follow-up from the lung cancer screening  program.  Will need to reenroll.   Review of Systems A 10 point review of systems was performed and it is as noted above otherwise negative.  Patient Active Problem List   Diagnosis Date Noted   Elevated vitamin B12 level 11/17/2022   Iron deficiency 11/17/2022   Chest pain 02/23/2022   Postural dizziness with presyncope 02/13/2022   Oral thrush 11/13/2021   Lymphadenopathy of right cervical region 11/13/2021   Skin lesion of breast 11/13/2021   Unilateral occipital headache 01/10/2021   Abdominal aortic ectasia 10/09/2020   Back pain 09/27/2020   Gross hematuria 09/14/2020   S/P CABG x 1 08/03/2020   Abnormal cardiovascular stress test    Aortic atherosclerosis 04/15/2020   COPD (chronic obstructive pulmonary disease) 04/15/2020   Pill dysphagia 04/05/2020   Cervical neck pain with evidence of disc disease 10/23/2019   Osteoarthritis 10/23/2019   Anemia 10/23/2019   Weight loss, unintentional 10/05/2019   Hepatic steatosis 05/03/2019   Thrombocytosis 04/24/2019   Abdominal pain 04/24/2019   Migraine 04/19/2018   Obesity, morbid, BMI 40.0-49.9 08/17/2017   Palpitations 01/08/2017   Mass of right side of neck 07/10/2016   Neutrophilia 04/30/2015   Advanced care planning/counseling discussion 01/22/2015   Health maintenance examination 01/22/2015   Medicare annual wellness visit, subsequent 12/15/2013   Tachycardia 09/13/2012   Osteoarthritis of left knee 12/11/2011   Candidal intertrigo 05/10/2011   Hypertension    Bipolar  disorder    Fibromyalgia    Hyperlipidemia associated with type 2 diabetes mellitus    GERD (gastroesophageal reflux disease)    Hypothyroidism 04/05/2011   OSA (obstructive sleep apnea) 03/16/2011   CAD (coronary artery disease) 03/16/2011   Type 2 diabetes mellitus with other specified complication 03/15/2011   Dyspnea 01/03/2011   Social History   Tobacco Use   Smoking status: Former    Packs/day: 2.00    Years: 43.00    Additional pack  years: 0.00    Total pack years: 86.00    Types: Cigarettes    Quit date: 05/17/2010    Years since quitting: 12.5   Smokeless tobacco: Never   Tobacco comments:    QUIT 2011  Substance Use Topics   Alcohol use: No    Alcohol/week: 0.0 standard drinks of alcohol   Allergies  Allergen Reactions   Crestor [Rosuvastatin] Other (See Comments)    myalgias   Lipitor [Atorvastatin] Other (See Comments)    Mood swings   Lithium Other (See Comments)    abd pain, n/v, was hospitalized   Quetiapine Other (See Comments)    Muscle twitching, muscle spasm   Current Meds  Medication Sig   acetaminophen (TYLENOL 8 HOUR ARTHRITIS PAIN) 650 MG CR tablet Take 650 mg by mouth every 8 (eight) hours as needed for pain.   albuterol (VENTOLIN HFA) 108 (90 Base) MCG/ACT inhaler Inhale 2 puffs into the lungs every 6 (six) hours as needed for wheezing or shortness of breath.   aspirin 81 MG EC tablet Take 1 tablet (81 mg total) by mouth daily.   aspirin-acetaminophen-caffeine (EXCEDRIN MIGRAINE) 250-250-65 MG tablet Take by mouth every 6 (six) hours as needed for headache.   Blood Glucose Monitoring Suppl (ONETOUCH VERIO) w/Device KIT Use as instructed to check blood sugar 2 times a day   Boswellia-Glucosamine-Vit D (OSTEO BI-FLEX ONE PER DAY PO) Take 1 tablet by mouth daily.   Budeson-Glycopyrrol-Formoterol (BREZTRI AEROSPHERE) 160-9-4.8 MCG/ACT AERO Inhale 2 puffs into the lungs in the morning and at bedtime.   Calcium Carb-Cholecalciferol (CALCIUM + VITAMIN D3 PO) Take by mouth daily.   cyclobenzaprine (FLEXERIL) 10 MG tablet TAKE 1/2 TO 1 TABLET BY MOUTH TWICE A DAY AS NEEDED FOR MUSCLE SPASMS AND MIGRAINE   famotidine (PEPCID) 20 MG tablet Take 20 mg by mouth every other day. Alternates with omeprazole   gabapentin (NEURONTIN) 100 MG capsule TAKE 1 TO 3 CAPSULES AT    BEDTIME   glucose blood (ONETOUCH VERIO) test strip Use as instructed to check blood sugar 2 times a day   IBUPROFEN PO Take by mouth as  needed.   Iron, Ferrous Sulfate, 325 (65 Fe) MG TABS Take 325 mg by mouth once a week.   Lancets (ONETOUCH DELICA PLUS LANCET33G) MISC Use as instructed to check blood sugar 2 times a day   levothyroxine (SYNTHROID) 100 MCG tablet TAKE 1 TABLET DAILY, AND ON2 DAYS A WEEK, TAKE AN     EXTRA 1/2 TABLET   loratadine (CLARITIN) 10 MG tablet Take 1 tablet (10 mg total) by mouth daily.   metFORMIN (GLUCOPHAGE) 500 MG tablet Take 1 tablet (500 mg total) by mouth 2 (two) times daily with a meal.   metoprolol succinate (TOPROL-XL) 25 MG 24 hr tablet Take 1 tablet (25 mg total) by mouth daily.   mometasone (ELOCON) 0.1 % cream Apply 1 Application topically daily as needed (Rash).   Multiple Vitamin (MULTIVITAMIN WITH MINERALS) TABS tablet Take 1 tablet by  mouth daily. One A Day for Women   mupirocin ointment (BACTROBAN) 2 % Apply 1 Application topically 2 (two) times daily.   omeprazole (PRILOSEC) 20 MG capsule Take 1 capsule (20 mg total) by mouth daily. (Patient taking differently: Take 20 mg by mouth every other day. Alternating with Famotidine)   OXYGEN Inhale 2 L into the lungs at bedtime.   Rhubarb (ESTROVEN COMPLETE PO) Take 1 tablet by mouth daily.   Semaglutide,0.25 or 0.5MG /DOS, (OZEMPIC, 0.25 OR 0.5 MG/DOSE,) 2 MG/1.5ML SOPN Inject 0.5 mg into the skin once a week. Via [facility] PAP   simvastatin (ZOCOR) 40 MG tablet Take 1 tablet (40 mg total) by mouth at bedtime.   torsemide (DEMADEX) 20 MG tablet TAKE 0.5 TABLETS (10 MG TOTAL) BY MOUTH AS DIRECTED. TAKE ONE HALF TABLET (10 MG) ONCE A DAY AND MAY TAKE ADDITIONAL ONE HALF TABLET (10 MG) AS NEEDED FOR WEIGHT GAIN OR SHORTNESS OF BREATH.   traMADol (ULTRAM) 50 MG tablet TAKE 1 TABLET (50 MG TOTAL) BY MOUTH TWICE A DAY AS NEEDED FOR MODERATE PAIN   ziprasidone (GEODON) 40 MG capsule Take 1 capsule (40 mg total) by mouth 2 (two) times daily with a meal.   Immunization History  Administered Date(s) Administered   COVID-19, mRNA,  vaccine(Comirnaty)12 years and older 07/15/2022   Fluad Quad(high Dose 65+) 04/24/2019, 05/26/2020, 05/16/2022   Influenza Split 05/09/2011, 06/11/2012   Influenza Whole 05/20/2010   Influenza, High Dose Seasonal PF 06/01/2021   Influenza,inj,Quad PF,6+ Mos 07/14/2013, 06/16/2014, 04/30/2015, 05/31/2016, 05/10/2017, 04/19/2018   Moderna Covid-19 Vaccine Bivalent Booster 24yrs & up 06/24/2021   Moderna SARS-COV2 Booster Vaccination 06/21/2020, 12/31/2020   Moderna Sars-Covid-2 Vaccination 10/18/2019, 11/15/2019   Pneumococcal Conjugate-13 11/14/2017, 04/19/2018   Pneumococcal Polysaccharide-23 04/18/2011, 06/01/2021   Respiratory Syncytial Virus Vaccine,Recomb Aduvanted(Arexvy) 07/15/2022   Td 04/18/2011   Zoster Recombinat (Shingrix) 11/14/2017, 01/02/2018   Zoster, Live 12/29/2013      Objective:   Physical Exam BP 132/70 (BP Location: Left Arm, Cuff Size: Large)   Pulse 87   Temp 98.1 F (36.7 C)   Ht  (1.549 m)   Wt 218 lb 3.2 oz (99 kg) Comment: per patient. in a wheelchair today.  SpO2 94%   BMI 41.23 kg/m   SpO2: 94 % O2 Device: None (Room air)  GENERAL: Obese woman, presents in transport chair.  No conversational dyspnea.  Looks older than stated age.  Looks very deconditioned. HEAD: Normocephalic, atraumatic. EYES: PERRL, anicteric sclera. MOUTH: Dentulous, oral mucosa moist. NECK: Supple. No thyromegaly. Trachea midline. No JVD.  No adenopathy.  Prominent dorsocervical fat pad posteriorly. PULMONARY: Good air entry bilaterally.  No adventitious sounds. CARDIOVASCULAR: S1 and S2. Regular rate and rhythm.  No rubs, murmurs or gallops heard.   ABDOMEN: Obese, nondistended. MUSCULOSKELETAL: No joint deformity, no clubbing, no edema.  Median sternotomy scar well-healed. NEUROLOGIC: No overt focal deficit.  Speech is fluent. SKIN: Intact,warm,dry. PSYCH: Mood and behavior normal     Assessment & Plan:     ICD-10-CM   1. Stage 1 mild COPD by GOLD classification   J44.9 Pulmonary Function Test [facility] Only   Continue Breztri 2 puffs twice a day Continue as needed albuterol Reassess with PFTs    2. Nocturnal hypoxemia due to obesity  E66.9    G47.36    Continue oxygen at 2 L/min Patient compliant with therapy Patient notes benefit of therapy    3. Shortness of breath  R06.02 Pulmonary Function Test [facility] Only  Proportion to PFT defect Multifactorial Suspect deconditioning a significant component    4. Morbid obesity  E66.01    Would benefit from weight loss    5. Physical deconditioning  R53.81    Reluctant to engage in rehab    6. Former heavy cigarette smoker (20-39 per day)  Z87.891 Ambulatory Referral for Lung Cancer Scre   Reenroll in lung cancer screening She has not relapsed     Orders Placed This Encounter  Procedures   Ambulatory Referral for Lung Cancer Scre    Referral Priority:   Routine    Referral Type:   Consultation    Referral Reason:   Specialty Services Required    Number of Visits Requested:   1   Pulmonary Function Test [facility] Only    Standing Status:   Future    Standing Expiration Date:   12/05/2023    Order Specific Question:   Full PFT: includes the following: basic spirometry, spirometry pre & post bronchodilator, diffusion capacity (DLCO), lung volumes    Answer:   Full PFT    Order Specific Question:   This test can only be performed at    Answer:   [facility]   The patient has been referred again to the lung cancer screening program so she can be reinstated.  We will order PFTs to reassess her COPD.  She is to continue her current medications.  Will see her in follow-up in 3 months time she is to call sooner should any new problems arise.  Gailen Shelter, MD Advanced Bronchoscopy PCCM [facility]    *This note was dictated using voice recognition software/Dragon.  Despite best efforts to proofread, errors can occur which can change the meaning. Any transcriptional errors that  result from this process are unintentional and may not be fully corrected at the time of dictation.

## 2022-12-05 NOTE — Patient Instructions (Signed)
Will send a referral to the lung cancer screening program so you can be reinstated.  They will call you to schedule your scan.  We are going to repeat breathing tests to reevaluate your lungs.  Continue using your oxygen at nighttime  Continue using your Breztri and as needed rescue inhaler.  Your shortness of breath is a combination of things.  Currently you are doing a lot of work for you and your husband and this is for sure taking a toll.  He also would benefit from a conditioning program but you indicate that currently you have too much on your plate.  When you are ready we can certainly refer you to pulmonary rehab I think this would help you greatly.  We will see you in follow-up in 3 months time call sooner should any new problems arise.

## 2022-12-06 ENCOUNTER — Encounter: Payer: Self-pay | Admitting: Pulmonary Disease

## 2022-12-06 DIAGNOSIS — J449 Chronic obstructive pulmonary disease, unspecified: Secondary | ICD-10-CM | POA: Diagnosis not present

## 2022-12-26 ENCOUNTER — Other Ambulatory Visit: Payer: Self-pay | Admitting: Family Medicine

## 2022-12-26 ENCOUNTER — Telehealth: Payer: Self-pay

## 2022-12-26 DIAGNOSIS — K219 Gastro-esophageal reflux disease without esophagitis: Secondary | ICD-10-CM

## 2022-12-26 NOTE — Telephone Encounter (Signed)
Received pt's med shipment of Ozempic 0.25 mg/0.5 mg (5 boxes total).  Spoke with pt notifying her the Ozempic shipment is ready to pick up.  [Placed Ozempic (5 boxes total) in 2nd refrigerator, 1st shelf.)       

## 2023-01-05 DIAGNOSIS — J449 Chronic obstructive pulmonary disease, unspecified: Secondary | ICD-10-CM | POA: Diagnosis not present

## 2023-01-12 ENCOUNTER — Telehealth: Payer: Self-pay

## 2023-01-12 NOTE — Progress Notes (Signed)
Care Management & Coordination Services Pharmacy Team  Reason for Encounter: Appointment Reminder  Contacted patient to confirm telephone appointment with Al Corpus, PharmD on 01/17/2023 at 11:00.  Spoke with patient on 01/12/2023   Do you have any problems getting your medications? No  What is your top health concern you would like to discuss at your upcoming visit? None  Have you seen any other providers since your last visit with PCP? Yes  Star Rating Drugs:  Medication:  Last Fill: Day Supply Metformin 500 mg 01/02/2023 90 Simvastatin 40 mg 12/25/2022 90  Care Gaps: Annual wellness visit in last year? Yes 11/15/2022  If Diabetic: Last eye exam / retinopathy screening: Up to date Last diabetic foot exam: Up to date  Al Corpus, PharmD notified  Claudina Lick, [place] Clinical Pharmacy Assistant [phone]

## 2023-01-17 ENCOUNTER — Telehealth: Payer: Self-pay | Admitting: Pharmacist

## 2023-01-17 ENCOUNTER — Other Ambulatory Visit: Payer: Self-pay

## 2023-01-17 ENCOUNTER — Ambulatory Visit: Payer: Medicare HMO | Admitting: Pharmacist

## 2023-01-17 DIAGNOSIS — Z87891 Personal history of nicotine dependence: Secondary | ICD-10-CM

## 2023-01-17 DIAGNOSIS — Z122 Encounter for screening for malignant neoplasm of respiratory organs: Secondary | ICD-10-CM

## 2023-01-17 DIAGNOSIS — E1169 Type 2 diabetes mellitus with other specified complication: Secondary | ICD-10-CM

## 2023-01-17 MED ORDER — METFORMIN HCL 500 MG PO TABS: 500.0000 mg | ORAL_TABLET | Freq: Every day | ORAL | 3 refills | Status: DC

## 2023-01-17 NOTE — Telephone Encounter (Signed)
I sent a message to both [facility] asking them to check on the [facility] referral. As for PFTs I don't schedule them until right before the patients follow up appt due to limited PFT appts

## 2023-01-17 NOTE — Progress Notes (Signed)
Care Management & Coordination Services Pharmacy Note  01/17/2023 Name:  DORTHEY HILLENBRAND MRN:  [id number] DOB:  1951-07-21  Summary: F/U visit -DM: A1c 6.5% (10/2022) on metformin 500 mg BID and Ozempic 0.5 mg; pt reports fasting BG 95-105 and 2-hr post-dinner BG 80-110; she feels dizzy/nauseous after dinner when BG is lower, not in AM though -HTN/HF: BP range 108/62 - 136/84 at goal; she reports dry weight is ~217 lbs, she has been 217-220 lbs over the last week or so. She also has been taking 1.5 tablet of torsemide (30 mg) for about a month due to weight gain; it is not clear if weight gain is due to fluid or fat, pt reports she is urinating frequently with torsemide and has not noticed excess swelling -COPD: Pulm ordered PFT and lung cancer screening at appt 11/2022, pt has not heard about scheduling these yet   Recommendations/Changes made from today's visit: -Advised trial of metformin reduction: take 500 mg in AM only; continue monitoring BG twice a day -Advised to continue torsemide 30 mg daily and keep cardiology appt this week; consider BNP to help determine cause for recent weight gain -Message sent to pulmonology office re: PFT and lung cancer screening referral  Follow up plan: -Health Concierge will call patient 1 month for DM update -Pharmacist follow up televisit scheduled for 3 months -PCP appt 11/15/22    Subjective: Ashwika TORRANCE DECORDOVA is an 72 y.o. year old female who is a primary patient of Eustaquio Boyden, MD.  The care coordination team was consulted for assistance with disease management and care coordination needs.    Engaged with patient by telephone for follow up visit. Patient is from [place], she has been in [place] for over 20 years. Her daughter still lives in [place], 2 brothers in [place], 1 brother in [place], sister in [place]. Pt is on disability x 10 years. Her husband is retired in his 11s. She is "not very mobile", gets out for doctors visits, grocery store. She uses walker to get  around. She does the cooking, laundry, her husband does most other household chores.   Recent office visits: 11/15/22 Dr Sharen Hones OV: annual - A1c 6.5%; start iron supplement weekly. Stay off B12. RTC 4 months.   05/16/22 Dr Sharen Hones OV: f/u - A1c 6.0%; reduce metformin to 500/1000 mg.  Recent consult visits: 12/05/22 Dr Jayme Cloud (Pulmonary): COPD - ordered PFT, lung cancer screening CT. Consider pulmonary rehab. F/u 3 months.  06/08/22 Dr Jayme Cloud (Pulmonary): Breztri samples given  Hospital visits: None in previous 6 months   Objective:  Lab Results  Component Value Date   CREATININE 0.82 11/08/2022   BUN 10 11/08/2022   GFR 72.61 11/08/2022   GFRNONAA >60 02/23/2022   GFRAA 80 08/26/2020   NA 140 11/08/2022   K 4.2 11/08/2022   CALCIUM 9.3 11/08/2022   CO2 27 11/08/2022   GLUCOSE 116 (H) 11/08/2022    Lab Results  Component Value Date/Time   HGBA1C 6.5 11/08/2022 08:01 AM   HGBA1C 5.8 (A) 08/15/2022 11:25 AM   HGBA1C 6.0 (A) 05/16/2022 10:45 AM   HGBA1C 6.6 (H) 11/11/2021 10:37 AM   GFR 72.61 11/08/2022 08:01 AM   GFR 62.06 11/11/2021 10:37 AM   MICROALBUR <0.7 11/08/2022 08:01 AM   MICROALBUR 1.6 11/11/2021 10:37 AM    Last diabetic Eye exam:  Lab Results  Component Value Date/Time   HMDIABEYEEXA No Retinopathy 02/14/2022 12:00 AM    Last diabetic Foot exam:  Lab Results  Component Value Date/Time   HMDIABFOOTEX done 02/25/2013 12:00 AM     Lab Results  Component Value Date   CHOL 147 11/08/2022   HDL 44.30 11/08/2022   LDLCALC 48 12/08/2013   LDLDIRECT 54.0 11/08/2022   TRIG 364.0 (H) 11/08/2022   CHOLHDL 3 11/08/2022       Latest Ref Rng & Units 11/08/2022    8:01 AM 11/11/2021   10:37 AM 09/14/2020    1:15 PM  Hepatic Function  Total Protein 6.0 - 8.3 g/dL 7.2  7.1  7.3   Albumin 3.5 - 5.2 g/dL 4.2  4.5  4.1   AST 0 - 37 U/L 15  14  13    ALT 0 - 35 U/L 14  13  9    Alk Phosphatase 39 - 117 U/L 52  51  65   Total Bilirubin 0.2 - 1.2 mg/dL  0.2  0.2  0.3     Lab Results  Component Value Date/Time   TSH 3.01 11/08/2022 08:01 AM   TSH 1.86 11/11/2021 10:37 AM   FREET4 0.94 04/10/2017 09:53 AM   FREET4 0.79 03/30/2016 08:52 AM       Latest Ref Rng & Units 11/08/2022    8:01 AM 02/23/2022    8:08 AM 11/11/2021   10:37 AM  CBC  WBC 4.0 - 10.5 K/uL 10.4  16.4  12.1   Hemoglobin 12.0 - 15.0 g/dL 91.4  78.2  95.6   Hematocrit 36.0 - 46.0 % 36.7  36.5  34.2   Platelets 150.0 - 400.0 K/uL 432.0  433  424.0    Iron/TIBC/Ferritin/ %Sat    Component Value Date/Time   IRON 47 11/08/2022 0801   TIBC 448.0 11/08/2022 0801   FERRITIN 6.5 (L) 11/08/2022 0801   IRONPCTSAT 10.5 (L) 11/08/2022 0801     Lab Results  Component Value Date/Time   VITAMINB12 >1500 (H) 11/08/2022 08:01 AM    Clinical ASCVD: Yes  The 10-year ASCVD risk score (Arnett DK, et al., 2019) is: 16.1%   Values used to calculate the score:     Age: 31 years     Sex: Female     Is Non-Hispanic African American: No     Diabetic: Yes     Tobacco smoker: No     Systolic Blood Pressure: 108 mmHg     Is BP treated: Yes     HDL Cholesterol: 44.3 mg/dL     Total Cholesterol: 147 mg/dL       10/04/863    7:84 PM 11/15/2022    2:30 PM 11/11/2021   10:42 AM  Depression screen PHQ 2/9  Decreased Interest 0 0 0  Down, Depressed, Hopeless 0 0 0  PHQ - 2 Score 0 0 0  Altered sleeping   0  Tired, decreased energy   1  Change in appetite   0  Feeling bad or failure about yourself    0  Trouble concentrating   0  Moving slowly or fidgety/restless   0  Suicidal thoughts   0  PHQ-9 Score   1     Social History   Tobacco Use  Smoking Status Former   Packs/day: 2.00   Years: 43.00   Additional pack years: 0.00   Total pack years: 86.00   Types: Cigarettes   Quit date: 05/17/2010   Years since quitting: 12.6  Smokeless Tobacco Never  Tobacco Comments   QUIT 2011   BP Readings from Last 3 Encounters:  01/17/23 108/62  12/05/22 132/70  11/15/22 122/64    Pulse Readings from Last 3 Encounters:  12/05/22 87  11/15/22 65  11/15/22 96   Wt Readings from Last 3 Encounters:  12/05/22 218 lb 3.2 oz (99 kg)  11/15/22 225 lb 2 oz (102.1 kg)  11/15/22 225 lb 12.8 oz (102.4 kg)   BMI Readings from Last 3 Encounters:  12/05/22 41.23 kg/m  11/15/22 42.54 kg/m  11/15/22 41.30 kg/m    Allergies  Allergen Reactions   Crestor [Rosuvastatin] Other (See Comments)    myalgias   Lipitor [Atorvastatin] Other (See Comments)    Mood swings   Lithium Other (See Comments)    abd pain, n/v, was hospitalized   Quetiapine Other (See Comments)    Muscle twitching, muscle spasm    Medications Reviewed Today     Reviewed by Kathyrn Sheriff, [facility] (Pharmacist) on 01/17/23 at 1134  Med List Status: <None>   Medication Order Taking? Sig Documenting Provider Last Dose Status Informant  acetaminophen (TYLENOL 8 HOUR ARTHRITIS PAIN) 650 MG CR tablet [id number] Yes Take 650 mg by mouth every 8 (eight) hours as needed for pain. [provider] Taking Active Self  albuterol (VENTOLIN HFA) 108 (90 Base) MCG/ACT inhaler [id number] Yes Inhale 2 puffs into the lungs every 6 (six) hours as needed for wheezing or shortness of breath. Salena Saner, MD Taking Active   aspirin 81 MG EC tablet [id number] Yes Take 1 tablet (81 mg total) by mouth daily. Debbe Odea, MD Taking Active   aspirin-acetaminophen-caffeine [facility] MIGRAINE) [id number] MG tablet [id number] Yes Take by mouth every 6 (six) hours as needed for headache. [provider] Taking Active   Blood Glucose Monitoring Suppl [facility] VERIO) w/Device KIT [id number] Yes Use as instructed to check blood sugar 2 times a day Eustaquio Boyden, MD Taking Active   Boswellia-Glucosamine-Vit D (OSTEO BI-FLEX ONE PER DAY PO) [id number] Yes Take 1 tablet by mouth daily. [provider] Taking Active   Budeson-Glycopyrrol-Formoterol (BREZTRI AEROSPHERE) 160-9-4.8 MCG/ACT AERO  [id number] Yes Inhale 2 puffs into the lungs in the morning and at bedtime. Salena Saner, MD Taking Active   Calcium Carb-Cholecalciferol (CALCIUM + VITAMIN D3 PO) [id number] Yes Take by mouth daily. [provider] Taking Active Self  cyclobenzaprine (FLEXERIL) 10 MG tablet [id number] Yes TAKE 1/2 TO 1 TABLET BY MOUTH TWICE A DAY AS NEEDED FOR MUSCLE SPASMS AND MIGRAINE Eustaquio Boyden, MD Taking Active   famotidine (PEPCID) 20 MG tablet [id number] Yes Take 20 mg by mouth every other day. Alternates with omeprazole [provider] Taking Active Self  gabapentin (NEURONTIN) 100 MG capsule [id number] Yes TAKE 1 TO 3 CAPSULES AT    BEDTIME Eustaquio Boyden, MD Taking Active   glucose blood [facility] VERIO) test strip [id number] Yes Use as instructed to check blood sugar 2 times a day Eustaquio Boyden, MD Taking Active   IBUPROFEN PO [id number] Yes Take by mouth as needed. [provider] Taking Active Self  Iron, Ferrous Sulfate, 325 (65 Fe) MG TABS [id number] Yes Take 325 mg by mouth once a week. Eustaquio Boyden, MD Taking Active   isosorbide mononitrate (IMDUR) 60 MG 24 hr tablet [id number]  Take 0.5 tablets (30 mg total) by mouth 2 (two) times daily. Creig Hines, NP  Expired 08/04/22 2359            Med Note [facility], Liberty Handy Nov 15, 2022  2:24 PM) Patient takes  Lancets Letta Pate Deercroft PLUS [place]) MISC [id number]  Yes Use as instructed to check blood sugar 2 times a day Eustaquio Boyden, MD Taking Active   levothyroxine (SYNTHROID) 100 MCG tablet [id number] Yes TAKE 1 TABLET DAILY, AND ON2 DAYS A WEEK, TAKE AN     EXTRA 1/2 TABLET Eustaquio Boyden, MD Taking Active   loratadine (CLARITIN) 10 MG tablet [id number] Yes Take 1 tablet (10 mg total) by mouth daily. Eustaquio Boyden, MD Taking Active Self  metFORMIN (GLUCOPHAGE) 500 MG tablet [id number] Yes Take 1 tablet (500 mg total) by mouth 2 (two) times daily with a meal. Eustaquio Boyden, MD  Taking Active   metoprolol succinate (TOPROL-XL) 25 MG 24 hr tablet [id number] Yes Take 1 tablet (25 mg total) by mouth daily. Eustaquio Boyden, MD Taking Active   mometasone (ELOCON) 0.1 % cream [id number] Yes Apply 1 Application topically daily as needed (Rash). Deirdre Evener, MD Taking Active            Med Note [facility], [facility] M   Wed Nov 15, 2022  2:25 PM) As needed  Multiple Vitamin (MULTIVITAMIN WITH MINERALS) TABS tablet [id number] Yes Take 1 tablet by mouth daily. One A Day for Women [provider] Taking Active Self  mupirocin ointment (BACTROBAN) 2 % [id number] Yes Apply 1 Application topically 2 (two) times daily. Deirdre Evener, MD Taking Active   omeprazole (PRILOSEC) 20 MG capsule [id number] Yes TAKE 1 CAPSULE EVERY OTHER DAY. ALTERNATE WITH        FAMOTIDINE 20MG  Eustaquio Boyden, MD Taking Active   OXYGEN [id number] Yes Inhale 2 L into the lungs at bedtime. [provider] Taking Active Self  Rhubarb (ESTROVEN COMPLETE PO) [id number] Yes Take 1 tablet by mouth daily. [provider] Taking Active Self  Semaglutide,0.25 or 0.5MG /DOS, (OZEMPIC, 0.25 OR 0.5 MG/DOSE,) 2 MG/1.5ML SOPN [id number] Yes Inject 0.5 mg into the skin once a week. Via [employer] PAP [provider] Taking Active   simvastatin (ZOCOR) 40 MG tablet [id number] Yes Take 1 tablet (40 mg total) by mouth at bedtime. Eustaquio Boyden, MD Taking Active   torsemide (DEMADEX) 20 MG tablet [id number] Yes TAKE 0.5 TABLETS (10 MG TOTAL) BY MOUTH AS DIRECTED. TAKE ONE HALF TABLET (10 MG) ONCE A DAY AND MAY TAKE ADDITIONAL ONE HALF TABLET (10 MG) AS NEEDED FOR WEIGHT GAIN OR SHORTNESS OF BREATH.  Patient taking differently: Take 30 mg by mouth daily. Take one half tablet (10 mg) once a day and may take additional one half tablet (10 mg) as needed for weight gain or shortness of breath.   Debbe Odea, MD Taking Active   traMADol Janean Sark) 50 MG tablet [id number] Yes TAKE 1 TABLET (50  MG TOTAL) BY MOUTH TWICE A DAY AS NEEDED FOR MODERATE PAIN Eustaquio Boyden, MD Taking Active   ziprasidone (GEODON) 40 MG capsule [id number] Yes Take 1 capsule (40 mg total) by mouth 2 (two) times daily with a meal. Eustaquio Boyden, MD Taking Active             SDOH:  (Social Determinants of Health) assessments and interventions performed: No SDOH Interventions    Flowsheet Row Clinical Support from 11/15/2022 in [facility] at [facility] Chronic Care Management from 02/15/2022 in [facility] at [facility] Clinical Support from 11/23/2020 in [facility] at [facility] Clinical Support from 04/10/2017 in [facility] [place]  SDOH Interventions      Food Insecurity Interventions Intervention Not Indicated -- -- --  Housing  Interventions Intervention Not Indicated -- -- --  Transportation Interventions Intervention Not Indicated -- -- --  Utilities Interventions Intervention Not Indicated -- -- --  Alcohol Usage Interventions Intervention Not Indicated (Score <7) -- -- --  Depression Interventions/Treatment  -- -- ZOX0-9 Score <4 Follow-up Not Indicated, Currently on Treatment --  [pt is being referred to PCP for further evaluation]  Financial Strain Interventions Intervention Not Indicated Other (Comment)  [PAP - Breztri, Ozempic] -- --  Physical Activity Interventions Patient Refused, Other (Comments) -- -- --  Stress Interventions Intervention Not Indicated -- -- --  Social Connections Interventions Intervention Not Indicated -- -- --       Medication Assistance:  [employer] approved 2024 Markus Daft - [place] approved 2024 (through pulmonary)  Medication Access: Within the past 30 days, how often has patient missed a dose of medication? 0 Is a pillbox or other method used to improve adherence? Yes  Factors that may affect medication adherence? no barriers identified Are meds synced by  current pharmacy? No  Are meds delivered by current pharmacy? Yes  Does patient experience delays in picking up medications due to transportation concerns? No  Current Rx insurance plan: [employer] Name and location of [facility] pharmacy[facility]  [phone] [place],  - [address] [place] [address] Phone: [phone] Fax: [phone]  [facility] [place], [place] - [address] [facility] AT Portal to Registered Caremark Sites [address] [place] [address] Phone: [phone] Fax: [phone]   Compliance/Adherence/Medication fill history: Care Gaps: Colonoscopy (due 12/2021) Lung cancer screening (due 03/2021) Mammogram (due 11/2022) - postponed d/t skin sores on breast  Star-Rating Drugs: Simvastatn - PDC 91% Metformin - PDC 87% (pt reduced dose from 3 to 2 tab daily; updated SIG) Ozempic - PAP   ASSESSMENT / PLAN  Hypertension / Heart Failure (BP goal <130/80) -Controlled - pt has been taking torsemide 1.5 tablets -pt reports marked improvement in chest pain since starting metoprolol;  -ECHO 05/2020 - LVEF 40% and Grade 1 diastolic dysfunction (prior to CABG) -Denies hypotensive/hypertensive symptoms -Current home readings: 108/62, 136/84, 128/82, 115/71 -Daily Weight: 217, 219, 220, 218; pt reports urination frequently -Current treatment: Isosorbide MN 60 mg -1/2 tab BID - Appropriate, Effective, Safe, Accessible Torsemide 20 mg 1.5 tab daily - Appropriate, Effective, Safe, Accessible Metoprolol succinate 25 mg daily PM - Appropriate, Effective, Safe, Accessible -Educated on BP goals and benefits of medications for prevention of heart attack, stroke and kidney damage; Daily salt intake goal < 2300 mg; -Discussed dry weight and instructions for torsemide; given increased swelling and wt above dry weight, it is reasonable to continue torsemide 20 mg/day; Advised to contact cardiology if wt gain > 3 lbs  overnight -reviewed impact of increasing diuretic on kidney function, she denies s/sx dehydration, reasonable to wait until CPE labs in 3 weeks to recheck BMP -Counseled to monitor BP at home daily -Recommended to continue current medication  Hyperlipidemia: (LDL goal < 70) -Controlled - LDL 53 (pt endorses compliance with statin and denies issues -Hx CAD, CABG 2012, 07/2020; Lexiscan Myoview 11/09/2020 with no significant ischemia, fixed apical defect. -Current treatment: Simvastatin 40 mg daily - Appropriate, Effective, Safe, Accessible Aspirin 81 mg daily -Appropriate, Effective, Safe, Accessible -Educated on Cholesterol goals; Benefits of statin for ASCVD risk reduction; -Recommended to continue current medication  Diabetes (A1c goal <7%) -Controlled - A1c 6.5%; pt reports dizziness, nausea with glucose <100 after dinner -Current home glucose readings fasting  glucose: 99, 98, 101 2-hr Post-prandial glucose: 70, 99, 82, 99, 82, 94 -Denies hypoglycemic/hyperglycemic symptoms -Current medications: Metformin 500 mg BID -Appropriate, Effective, Safe, Accessible Ozempic 0.5 mg weekly - Appropriate, Effective, Safe, Accessible Testing supplies -Medications previously tried: Novolin N, glimepiride, pioglitazone -Current exercise: step exercises - 4000 steps/day (limited d/t knees, SOB) -Recommend to hold PM dose of metformin  COPD (Goal: control symptoms and prevent exacerbations) -Stable - Pt notes improvement since taking Breztri now that she gets it free through PAP; she reports chronic SOB that is unchanged but bothersome; she reports she tried to get portable O2 in the past but did not qualify -Gold Grade: Gold 1 (FEV1>80%) -Current COPD Classification:  A (low sx, <2 exacerbations/yr) -MMRC/CAT score: not on file -Pulmonary function testing: 04/20/20 - FEV1 84% predicted, FEV1/FVC 0.76 -Exacerbations requiring treatment in last 6 months: 0 -Current treatment  Breztri 160-9-4.8  mcg/act 2 puffs BID (PAP) -Appropriate, Effective, Safe, Accessible Albuterol HFA prn - Appropriate, Effective, Safe, Accessible Oxygen 2L HS - wants portable  -Medications previously tried: Trelegy ($$), Anoro, Symbciort  -Patient denies consistent use of maintenance inhaler -Frequency of rescue inhaler use: rare -Advised to contact pulmonary to discuss if re-testing for portable O2 is an option -Recommend to continue current medication  Bipolar disorder (Goal: manage symptoms) -Controlled - Pt reports "ziprasidone is a life saver"; she never wants to change this med -Current treatment: Ziprasidone 40 mg BID -Appropriate, Effective, Safe, Accessible -Medications previously tried/failed: quetiapine, diazepam -Connected with PCP for mental health support -Recommended to continue current medication  Pain (Goal: manage symptoms) -Not ideally controlled - pt reports gel injections in her knee were not helpful; she is meant to follow up with ortho for next steps but will have a hard time getting to any appts in immediate future due to husband's poor health -osteoarthritis of knee, cervical pain, fibromyalgia -Current treatment  Cyclobenzaprine 10 mg PRN -Appropriate, Query Effective Gabapentin 100 mg 1-3 cap HS -Appropriate, Query Effective Ibuprofen 200 mg PRN - Appropriate, Effective,  Query Safe Tylenol 650 mg - 2 tab BID -Appropriate, Query Effective Tramadol 50 mg PRN -Appropriate, Query Effective -Counseled on benefits/risks of increasing gabapentin, advised it is safe to try higher dose -Counseled on risks of chronic NSAID use -  advised her to use the lowest effective dose of NSAID to control her pain in order to reduce CV and kidney risks  Health Maintenance -Vaccine gaps: TDAP, covid booster -Hx migraines; tx with Excedrin and flexeril; pt previously tried Ajovy but stopped d/t cost  Al Corpus, PharmD, [employer] Clinical Pharmacist [facility] at [facility] [phone]

## 2023-01-17 NOTE — Telephone Encounter (Signed)
Patient reports she saw pulmonologist 12/05/22 and referrals were ordered for PFTs and lung cancer screening, she has not heard from anyone about scheduling these yet and would like an update. Her follow up appt with Dr Jayme Cloud is 03/21/23 and they were hoping to have results by then.  Routing to pulmonology for assistance.

## 2023-01-17 NOTE — Telephone Encounter (Signed)
Anita, please advise. Thanks 

## 2023-01-18 ENCOUNTER — Ambulatory Visit: Payer: Medicare HMO | Admitting: Cardiology

## 2023-01-18 ENCOUNTER — Other Ambulatory Visit: Payer: Self-pay | Admitting: Family Medicine

## 2023-01-18 DIAGNOSIS — M797 Fibromyalgia: Secondary | ICD-10-CM

## 2023-01-18 DIAGNOSIS — E1169 Type 2 diabetes mellitus with other specified complication: Secondary | ICD-10-CM

## 2023-01-18 NOTE — Telephone Encounter (Signed)
Gabapentin  Last filled:  11/14/22, #200 Last OV:  11/15/22, CPE Next OV:  03/19/23, 4 mo DM f/u  Metformin rx printed on 01/17/23, #90/3 by Mardella Layman. Request denied.

## 2023-01-19 ENCOUNTER — Ambulatory Visit: Payer: Medicare HMO | Attending: Cardiology | Admitting: Cardiology

## 2023-01-19 ENCOUNTER — Encounter: Payer: Self-pay | Admitting: Cardiology

## 2023-01-19 ENCOUNTER — Encounter: Payer: Self-pay | Admitting: Family Medicine

## 2023-01-19 VITALS — BP 106/68 | HR 87 | Ht 62.0 in | Wt 222.8 lb

## 2023-01-19 DIAGNOSIS — I25118 Atherosclerotic heart disease of native coronary artery with other forms of angina pectoris: Secondary | ICD-10-CM | POA: Diagnosis not present

## 2023-01-19 DIAGNOSIS — E785 Hyperlipidemia, unspecified: Secondary | ICD-10-CM

## 2023-01-19 DIAGNOSIS — I10 Essential (primary) hypertension: Secondary | ICD-10-CM | POA: Diagnosis not present

## 2023-01-19 NOTE — Patient Instructions (Addendum)
Medication Instructions:  - Your physician has recommended you make the following change in your medication:   1) INCREASE Ozempic:   Inject 1.0 MG into skin once a week, for 4 weeks.     Inject 2.0 MG into skin once a week, and continue at this dose.     We will need to contact the company that is sending your Ozempic to find out what is going on with the dosing.    *If you need a refill on your cardiac medications before your next appointment, please call your pharmacy*   Lab Work: - none ordered  If you have labs (blood work) drawn today and your tests are completely normal, you will receive your results only by: MyChart Message (if you have MyChart) OR A paper copy in the mail If you have any lab test that is abnormal or we need to change your treatment, we will call you to review the results.   Testing/Procedures: - none ordered   Follow-Up: At [facility], you and your health needs are our priority.  As part of our continuing mission to provide you with exceptional heart care, we have created designated Provider Care Teams.  These Care Teams include your primary Cardiologist (physician) and Advanced Practice Providers (APPs -  Physician Assistants and Nurse Practitioners) who all work together to provide you with the care you need, when you need it.  We recommend signing up for the patient portal called "MyChart".  Sign up information is provided on this After Visit Summary.  MyChart is used to connect with patients for Virtual Visits (Telemedicine).  Patients are able to view lab/test results, encounter notes, upcoming appointments, etc.  Non-urgent messages can be sent to your provider as well.   To learn more about what you can do with MyChart, go to [email or web address].    Your next appointment:   6 month(s)  Provider:   You may see Debbe Odea, MD or one of the following Advanced Practice Providers on your designated Care Team:   Nicolasa Ducking, NP Eula Listen, PA-C Cadence Fransico Michael, PA-C Charlsie Quest, NP    Other Instructions N/a

## 2023-01-19 NOTE — Progress Notes (Signed)
Cardiology Office Note:    Date:  01/19/2023   ID:  [name], DOB 1951/08/13, MRN [id number]  PCP:  Jessica Boyden, MD  [facility] Cardiologist:  Jessica Odea, MD  [facility] Electrophysiologist:  None   Referring MD: Jessica Boyden, MD   Chief Complaint  Patient presents with   Follow-up    Stable chronic dyspnea on exertion that is followed with pulmonology.  Patient denies new or acute cardiac problems/concerns today.      History of Present Illness:    [name] is a 72 y.o. female with a hx of CAD/CABG x 1 (LIMA-LAD, 07/2020), hypertension, hyperlipidemia, diabetes, former smoker x40 years, COPD, OSA, morbid obesity who presents for follow-up.  Denies chest pain, states losing some weight with initiating Ozempic, still on the starting dose, did not know she had to increase her dosage every 4 weeks.  Plans to follow-up with pulmonary medicine for PFTs.  Compliant with Toprol-XL, Imdur as prescribed.  Prior notes Intraoperative TEE, EF approximately 50%. Right and left heart cath 06/2020 single-vessel CAD sequential 70% ostial, 90% mid LAD. CAD/CABG x1 LIMA to LAD 07/2020 Lexiscan Myoview 11/09/2020 with no significant ischemia, fixed apical defect.   Past Medical History:  Diagnosis Date   Anxiety    Arthritis    per patient "all over body"   Bipolar disorder [name])    has stopped seeing psychiatrist   CAD (coronary artery disease)    a. 06/2020 MV: small, sev, apical inf/apical defect-scar vs ischemia. EF 45%; b. 06/2020 Cath: LM 57m, LAD 70ost, 83m, LCX nl, RCA nl; c. 07/2020 CABG x 1: LIMA->LAD; d. 03/2022 MV: Significantly degraded imaging due to breast attenuation/obesity/extracardiac activity. No obvious isch/scar. EF 30-35% (EF 50-55% w/o rwma on f/u echo).   Cataract    Chronic bronchitis    COPD (chronic obstructive pulmonary disease) (HCC)    Depression    Dyspnea 2012   s/p pulm/cards w/u WNL, thought due to  obesity/deconditioning   Fibromyalgia    GERD (gastroesophageal reflux disease)    HFmrEF (heart failure with mid-range ejection fraction) (HCC)    a. 05/2020 Echo: EF 40%, glob HK, mild LVH, Gr1 DD; b. 06/2020 EF by SPECT: 45%; c. 07/2020 intraop TEE: EF 50%; d. 03/2022 Echo: EF 50-55%, no rwma, GrI DD, mild LVH, nl RV fxn, mild MR.   History of chicken pox    HLD (hyperlipidemia)    Hypertension    Hypothyroidism    Ischemic cardiomyopathy    a. 05/2020 Echo: EF 40%; b. 06/2020 EF by SPECT: 45%; c. 07/2020 intraop TEE: EF 50%.   Migraine    OSA (obstructive sleep apnea) 03/16/2011   Pneumonia    Sleep apnea    per patient cannot tolerate CPAP   T2DM (type 2 diabetes mellitus) (HCC) 2012   DM education 06/2011   Urinary incontinence     Past Surgical History:  Procedure Laterality Date   BREAST CYST ASPIRATION Right 2003   cardiac catherization  03/2011   x3 Dicie Beam), mild nonobstructive CAD   COLONOSCOPY  12/2011   hyperplastic polyp x2,. diverticulosis, rec rpt 10 yrs   CORONARY ARTERY BYPASS GRAFT N/A 08/03/2020   Procedure: CORONARY ARTERY BYPASS GRAFTING TIMES ONE USING LEFT INTERNAL MAMMARY ARTERY.;  Surgeon: Alleen Borne, MD;  Location: [facility] OR;  Service: Open Heart Surgery;  Laterality: N/A;   ESOPHAGOGASTRODUODENOSCOPY  2006   ESOPHAGOGASTRODUODENOSCOPY  05/2020   no esophageal abnormality to explain dysphagia - esophagus dilated. Erosive  gastropathy - neg H pylori [name])   KNEE SURGERY  2006   left, torn meniscus   RIGHT/LEFT HEART CATH AND CORONARY ANGIOGRAPHY N/A 07/13/2020   Procedure: RIGHT/LEFT HEART CATH AND CORONARY ANGIOGRAPHY;  Surgeon: Jessica Kendall, MD;  Location: [facility] INVASIVE CV LAB;  Service: Cardiovascular;  Laterality: N/A;   TEE WITHOUT CARDIOVERSION N/A 08/03/2020   Procedure: TRANSESOPHAGEAL ECHOCARDIOGRAM (TEE);  Surgeon: Alleen Borne, MD;  Location: [facility] OR;  Service: Open Heart Surgery;  Laterality: N/A;   TOTAL ABDOMINAL HYSTERECTOMY  2004    fibroids, cervix remained   TOTAL KNEE ARTHROPLASTY Left 08/2012   [name], [facility]    Current Medications: Current Meds  Medication Sig   acetaminophen (TYLENOL 8 HOUR ARTHRITIS PAIN) 650 MG CR tablet Take 650 mg by mouth every 8 (eight) hours as needed for pain.   albuterol (VENTOLIN HFA) 108 (90 Base) MCG/ACT inhaler Inhale 2 puffs into the lungs every 6 (six) hours as needed for wheezing or shortness of breath.   aspirin 81 MG EC tablet Take 1 tablet (81 mg total) by mouth daily.   aspirin-acetaminophen-caffeine (EXCEDRIN MIGRAINE) 250-250-65 MG tablet Take by mouth every 6 (six) hours as needed for headache.   Blood Glucose Monitoring Suppl (ONETOUCH VERIO) w/Device KIT Use as instructed to check blood sugar 2 times a day   Boswellia-Glucosamine-Vit D (OSTEO BI-FLEX ONE PER DAY PO) Take 1 tablet by mouth daily.   Budeson-Glycopyrrol-Formoterol (BREZTRI AEROSPHERE) 160-9-4.8 MCG/ACT AERO Inhale 2 puffs into the lungs in the morning and at bedtime.   Calcium Carb-Cholecalciferol (CALCIUM + VITAMIN D3 PO) Take by mouth daily.   cyclobenzaprine (FLEXERIL) 10 MG tablet TAKE 1/2 TO 1 TABLET BY MOUTH TWICE A DAY AS NEEDED FOR MUSCLE SPASMS AND MIGRAINE   famotidine (PEPCID) 20 MG tablet Take 20 mg by mouth every other day. Alternates with omeprazole   gabapentin (NEURONTIN) 100 MG capsule TAKE 1 TO 3 CAPSULES AT    BEDTIME   glucose blood (ONETOUCH VERIO) test strip Use as instructed to check blood sugar 2 times a day   IBUPROFEN PO Take by mouth as needed.   Iron, Ferrous Sulfate, 325 (65 Fe) MG TABS Take 325 mg by mouth once a week.   isosorbide mononitrate (IMDUR) 60 MG 24 hr tablet Take 0.5 tablets (30 mg total) by mouth 2 (two) times daily.   Lancets (ONETOUCH DELICA PLUS LANCET33G) MISC Use as instructed to check blood sugar 2 times a day   levothyroxine (SYNTHROID) 100 MCG tablet TAKE 1 TABLET DAILY, AND ON2 DAYS A WEEK, TAKE AN     EXTRA 1/2 TABLET   loratadine (CLARITIN) 10  MG tablet Take 1 tablet (10 mg total) by mouth daily.   metFORMIN (GLUCOPHAGE) 500 MG tablet Take 1 tablet (500 mg total) by mouth daily with breakfast.   metoprolol succinate (TOPROL-XL) 25 MG 24 hr tablet Take 1 tablet (25 mg total) by mouth daily.   mometasone (ELOCON) 0.1 % cream Apply 1 Application topically daily as needed (Rash).   Multiple Vitamin (MULTIVITAMIN WITH MINERALS) TABS tablet Take 1 tablet by mouth daily. One A Day for Women   mupirocin ointment (BACTROBAN) 2 % Apply 1 Application topically 2 (two) times daily.   omeprazole (PRILOSEC) 20 MG capsule TAKE 1 CAPSULE EVERY OTHER DAY. ALTERNATE WITH        FAMOTIDINE 20MG    OXYGEN Inhale 2 L into the lungs at bedtime.   Rhubarb (ESTROVEN COMPLETE PO) Take 1 tablet by mouth daily.  Semaglutide,0.25 or 0.5MG /DOS, (OZEMPIC, 0.25 OR 0.5 MG/DOSE,) 2 MG/1.5ML SOPN Inject 0.5 mg into the skin once a week. Via [facility] PAP   simvastatin (ZOCOR) 40 MG tablet Take 1 tablet (40 mg total) by mouth at bedtime.   torsemide (DEMADEX) 20 MG tablet TAKE 0.5 TABLETS (10 MG TOTAL) BY MOUTH AS DIRECTED. TAKE ONE HALF TABLET (10 MG) ONCE A DAY AND MAY TAKE ADDITIONAL ONE HALF TABLET (10 MG) AS NEEDED FOR WEIGHT GAIN OR SHORTNESS OF BREATH. (Patient taking differently: Take 30 mg by mouth daily. Take one half tablet (10 mg) once a day and may take additional one half tablet (10 mg) as needed for weight gain or shortness of breath.)   traMADol (ULTRAM) 50 MG tablet TAKE 1 TABLET (50 MG TOTAL) BY MOUTH TWICE A DAY AS NEEDED FOR MODERATE PAIN   ziprasidone (GEODON) 40 MG capsule Take 1 capsule (40 mg total) by mouth 2 (two) times daily with a meal.     Allergies:   Crestor [rosuvastatin], Lipitor [atorvastatin], Lithium, and Quetiapine   Social History   Socioeconomic History   Marital status: Married    Spouse name: Not on file   Number of children: Not on file   Years of education: Not on file   Highest education level: Not on file  Occupational  History   Occupation: disabled    Employer: OTHER  Tobacco Use   Smoking status: Former    Packs/day: 2.00    Years: 43.00    Additional pack years: 0.00    Total pack years: 86.00    Types: Cigarettes    Quit date: 05/17/2010    Years since quitting: 12.6   Smokeless tobacco: Never   Tobacco comments:    QUIT 2011  Vaping Use   Vaping Use: Never used  Substance and Sexual Activity   Alcohol use: No    Alcohol/week: 0.0 standard drinks of alcohol   Drug use: No   Sexual activity: Yes    Partners: Male  Other Topics Concern   Not on file  Social History Narrative   Caffeine: 2-3 coffee/am, 1 cup tea in afternoon; Lives with husband, 1 dog, 2 cats; Occupation: disability since 2009 for bipolar, previously worked at [employer]; Diet: not many fruits, good vegetables, good amt water.      Smoker 43 years; quit in 2011. No alcohol. Lives in [place]; with husband.    Social Determinants of Health   Financial Resource Strain: Low Risk  (11/15/2022)   Overall Financial Resource Strain (CARDIA)    Difficulty of Paying Living Expenses: Not hard at all  Food Insecurity: No Food Insecurity (11/15/2022)   Hunger Vital Sign    Worried About Running Out of Food in the Last Year: Never true    Ran Out of Food in the Last Year: Never true  Transportation Needs: No Transportation Needs (11/15/2022)   PRAPARE - Administrator, Civil Service (Medical): No    Lack of Transportation (Non-Medical): No  Physical Activity: Insufficiently Active (11/15/2022)   Exercise Vital Sign    Days of Exercise per Week: 3 days    Minutes of Exercise per Session: 20 min  Stress: No Stress Concern Present (11/15/2022)   [employer] of Occupational Health - Occupational Stress Questionnaire    Feeling of Stress : Not at all  Social Connections: Moderately Isolated (11/15/2022)   Social Connection and Isolation Panel [NHANES]    Frequency of Communication with Friends and Family: More than  three  times a week    Frequency of Social Gatherings with Friends and Family: More than three times a week    Attends Religious Services: Never    Database administrator or Organizations: No    Attends Engineer, structural: Never    Marital Status: Married     Family History: The patient's family history includes Acute lymphoblastic leukemia (age of onset: 3) in her cousin; Aneurysm in her maternal uncle; Arthritis in her mother; Asthma in her maternal uncle; Breast cancer in her maternal aunt; COPD in her sister; Cancer in her maternal uncle and paternal uncle; Cancer (age of onset: 62) in her father; Cancer (age of onset: 59) in her maternal aunt; Colon cancer (age of onset: 64) in her maternal uncle; Diabetes in her father; Hypertension in her mother; Stroke in her mother; Thyroid disease in her father. There is no history of Coronary artery disease, Stomach cancer, Rectal cancer, or Esophageal cancer.  ROS:   Please see the history of present illness.     All other systems reviewed and are negative.  EKGs/Labs/Other Studies Reviewed:    The following studies were reviewed today:   EKG:  EKG is ordered today.  EKG shows normal sinus rhythm,  Recent Labs: 11/08/2022: ALT 14; BUN 10; Creatinine, Ser 0.82; Hemoglobin 12.2; Platelets 432.0; Potassium 4.2; Sodium 140; TSH 3.01  Recent Lipid Panel    Component Value Date/Time   CHOL 147 11/08/2022 0801   TRIG 364.0 (H) 11/08/2022 0801   HDL 44.30 11/08/2022 0801   CHOLHDL 3 11/08/2022 0801   VLDL 72.8 (H) 11/08/2022 0801   LDLCALC 48 12/08/2013 0824   LDLDIRECT 54.0 11/08/2022 0801     Risk Assessment/Calculations:      Physical Exam:    VS:  BP 106/68 (BP Location: Left Arm, Patient Position: Sitting, Cuff Size: Large)   Pulse 87   Ht 5\' 2"  (1.575 m)   Wt 222 lb 12.8 oz (101.1 kg)   SpO2 93%   BMI 40.75 kg/m     Wt Readings from Last 3 Encounters:  01/19/23 222 lb 12.8 oz (101.1 kg)  12/05/22 218 lb 3.2 oz (99 kg)   11/15/22 225 lb 2 oz (102.1 kg)     GEN:  Well nourished, well developed in no acute distress HEENT: Normal NECK: No JVD; No carotid bruits CARDIAC: RRR, no murmurs, rubs, gallops RESPIRATORY:  Clear to auscultation, decreased breath sounds at bases ABDOMEN: Soft, non-tender, distended MUSCULOSKELETAL:  No edema; left-sided chest tenderness noted. SKIN: Warm and dry NEUROLOGIC:  Alert and oriented x 3 PSYCHIATRIC:  Normal affect   ASSESSMENT:    1. Coronary artery disease of native artery of native heart with stable angina pectoris (HCC)   2. Primary hypertension   3. Hyperlipidemia LDL goal <70   4. Morbid obesity (HCC)    PLAN:    In order of problems listed above:  CAD/CABG x1 on 07/2020.  Stable angina, controlled with beta-blocker and Imdur.  Continue Toprol-XL 25 mg daily, Imdur 60 mg daily. EF 50%.  Continue aspirin, statin. Hypertension, BP controlled.  Continue Toprol-XL 25 mg daily, Imdur 60 mg daily. hyperlipidemia.  LDL at goal, continue simvastatin. morbidly obese, patient is diabetic, continue Ozempic.  Advised to titrate every 4 weeks as prescribed.  Follow-up in 6 months.   Medication Adjustments/Labs and Tests Ordered: Current medicines are reviewed at length with the patient today.  Concerns regarding medicines are outlined above.  Orders Placed This Encounter  Procedures   EKG 12-Lead     No orders of the defined types were placed in this encounter.     Patient Instructions  Medication Instructions:  - Your physician has recommended you make the following change in your medication:   1) INCREASE Ozempic:   Inject 1.0 MG into skin once a week, for 4 weeks.     Inject 2.0 MG into skin once a week, and continue at this dose.     We will need to contact the company that is sending your Ozempic to find out what is going on with the dosing.    *If you need a refill on your cardiac medications before your next appointment, please call your  pharmacy*   Lab Work: - none ordered  If you have labs (blood work) drawn today and your tests are completely normal, you will receive your results only by: MyChart Message (if you have MyChart) OR A paper copy in the mail If you have any lab test that is abnormal or we need to change your treatment, we will call you to review the results.   Testing/Procedures: - none ordered   Follow-Up: At [facility], you and your health needs are our priority.  As part of our continuing mission to provide you with exceptional heart care, we have created designated Provider Care Teams.  These Care Teams include your primary Cardiologist (physician) and Advanced Practice Providers (APPs -  Physician Assistants and Nurse Practitioners) who all work together to provide you with the care you need, when you need it.  We recommend signing up for the patient portal called "MyChart".  Sign up information is provided on this After Visit Summary.  MyChart is used to connect with patients for Virtual Visits (Telemedicine).  Patients are able to view lab/test results, encounter notes, upcoming appointments, etc.  Non-urgent messages can be sent to your provider as well.   To learn more about what you can do with MyChart, go to [email or web address].    Your next appointment:   6 month(s)  Provider:   You may see Jessica Odea, MD or one of the following Advanced Practice Providers on your designated Care Team:   Nicolasa Ducking, NP Eula Listen, PA-C Cadence Fransico Michael, PA-C Charlsie Quest, NP    Other Instructions N/a    Signed, Jessica Odea, MD  01/19/2023 12:39 PM    [facility]

## 2023-01-22 NOTE — Addendum Note (Signed)
Addended by: Eustaquio Boyden on: 01/22/2023 08:02 AM   Modules accepted: Orders

## 2023-01-23 ENCOUNTER — Telehealth: Payer: Self-pay | Admitting: *Deleted

## 2023-01-23 NOTE — Telephone Encounter (Signed)
I have called and spoken with the patient and notified her of the information below.  The patient voices understanding and was very appreciative of the call to follow up.

## 2023-01-23 NOTE — Telephone Encounter (Signed)
-----   Message from [name] [place], [facility] sent at 01/22/2023  4:16 PM EDT ----- Regarding: RE: ozempic No problem - I have faxed refill request to [facility] for Ozempic 1 mg x 1 pen and Ozempic 2 mg x 6 pens.  [name]  ----- Message ----- From: Eustaquio Boyden, MD Sent: 01/22/2023   8:03 AM EDT To: Jefferey Pica, RN; Kathyrn Sheriff, RPH Subject: ozempic                                        Ok to do.  [name] - how do we send in higher ozempic 1mg  weekly dose for pt for 1 month then increase to 2mg  weekly?  She is currently getting Ozempic through [facility] P[facility].  Thanks, Wynona Canes   ----- Message ----- From: Jefferey Pica, RN Sent: 01/19/2023  12:54 PM EDT To: Jefferey Pica, RN; Eustaquio Boyden, MD; #  Hello Dr. Sharen Hones,  Dr. Azucena Cecil saw this patient at [facility] this morning. He had tried starting her on Ozempic a year ago and somehow your office ended up managing this for her and getting her on patient assistance.  However, I think she has been stuck on the 0.5 mg dose for quite some time.  Dr. Azucena Cecil would like for her to increase her dose as follows:  Inject 1.0 MG into skin once a week, for 4 weeks.   Inject 2.0 MG into skin once a week, and continue at this dose.   We are happy to manage this, but the issue is she is currently on patient assistance under you and I'm not sure if the company will take a different RX  under an different provider. Is this something you would mind sending to her patient assistance to get her moving along to the next doses and ultimately a maintenance dose of 2.0 mg?   Just let me know!   Thank you! Herbert Seta, RN

## 2023-01-25 ENCOUNTER — Ambulatory Visit
Admission: RE | Admit: 2023-01-25 | Discharge: 2023-01-25 | Disposition: A | Discharge status: Home / Self Care | Payer: Medicare HMO | Source: Ambulatory Visit | Attending: Acute Care | Admitting: Acute Care

## 2023-01-25 DIAGNOSIS — Z122 Encounter for screening for malignant neoplasm of respiratory organs: Secondary | ICD-10-CM

## 2023-01-25 DIAGNOSIS — I700 Atherosclerosis of aorta: Secondary | ICD-10-CM | POA: Diagnosis not present

## 2023-01-25 DIAGNOSIS — Z87891 Personal history of nicotine dependence: Secondary | ICD-10-CM

## 2023-01-25 DIAGNOSIS — J439 Emphysema, unspecified: Secondary | ICD-10-CM | POA: Diagnosis not present

## 2023-02-02 ENCOUNTER — Other Ambulatory Visit: Payer: Self-pay

## 2023-02-02 DIAGNOSIS — Z122 Encounter for screening for malignant neoplasm of respiratory organs: Secondary | ICD-10-CM

## 2023-02-02 DIAGNOSIS — Z87891 Personal history of nicotine dependence: Secondary | ICD-10-CM

## 2023-02-05 DIAGNOSIS — J449 Chronic obstructive pulmonary disease, unspecified: Secondary | ICD-10-CM | POA: Diagnosis not present

## 2023-02-13 ENCOUNTER — Telehealth: Payer: Self-pay

## 2023-02-13 NOTE — Telephone Encounter (Addendum)
Received pt's med shipment of Ozempic 1 mg (2 boxes total) and 2 mg (2 boxes total)- 4 boxes all together.    Sent MyChart message notifying pt her Ozempic shipment is ready to pick up.   [Placed Ozempic (4 boxes total) in 2nd refrigerator, top shelf.)

## 2023-02-16 DIAGNOSIS — H40053 Ocular hypertension, bilateral: Secondary | ICD-10-CM | POA: Diagnosis not present

## 2023-02-16 DIAGNOSIS — E119 Type 2 diabetes mellitus without complications: Secondary | ICD-10-CM | POA: Diagnosis not present

## 2023-02-16 DIAGNOSIS — H2513 Age-related nuclear cataract, bilateral: Secondary | ICD-10-CM | POA: Diagnosis not present

## 2023-02-16 LAB — HM DIABETES EYE EXAM

## 2023-03-07 DIAGNOSIS — J449 Chronic obstructive pulmonary disease, unspecified: Secondary | ICD-10-CM | POA: Diagnosis not present

## 2023-03-09 ENCOUNTER — Telehealth: Payer: Self-pay

## 2023-03-09 ENCOUNTER — Other Ambulatory Visit: Payer: Self-pay

## 2023-03-09 NOTE — Telephone Encounter (Signed)
Received a fax requesting a refill for Torsemide 20 mg. The previous RX states the following:   TAKE 0.5 TABLETS (10 MG TOTAL) BY MOUTH AS DIRECTED. TAKE ONE HALF TABLET (10 MG) ONCE A DAY AND MAY TAKE ADDITIONAL ONE HALF TABLET (10 MG) AS NEEDED FOR WEIGHT GAIN OR SHORTNESS OF BREATH.   I called the patient to clarify how she is taking Torsemide and she states that she is take 30mg  every morning.  Patient would like Rx to be sent to [facility]

## 2023-03-11 ENCOUNTER — Other Ambulatory Visit: Payer: Self-pay | Admitting: Family Medicine

## 2023-03-11 DIAGNOSIS — M797 Fibromyalgia: Secondary | ICD-10-CM

## 2023-03-12 MED ORDER — TORSEMIDE 20 MG PO TABS: 30.0000 mg | ORAL_TABLET | Freq: Every day | ORAL | 3 refills | Status: DC

## 2023-03-12 NOTE — Telephone Encounter (Signed)
Called patient.  Made her aware of refilling her torsemide and requested her to get blood work in 10 days.  Per Dr. Amaryllis Dyke recommendations.   "Okay to prescribe torsemide 30 mg every morning.  Please have patient check BMP in 10 days."

## 2023-03-12 NOTE — Telephone Encounter (Signed)
Flexeril Last filled:  08/31/22, #30 Last OV:  11/15/22, CPE Next OV:  03/19/23, 4 mo DM f/u

## 2023-03-15 ENCOUNTER — Ambulatory Visit: Payer: Medicare HMO

## 2023-03-15 ENCOUNTER — Other Ambulatory Visit
Admission: RE | Admit: 2023-03-15 | Discharge: 2023-03-15 | Disposition: A | Discharge status: Home / Self Care | Payer: Medicare HMO | Source: Ambulatory Visit | Attending: Pulmonary Disease | Admitting: Pulmonary Disease

## 2023-03-15 ENCOUNTER — Other Ambulatory Visit: Payer: Self-pay

## 2023-03-15 DIAGNOSIS — J449 Chronic obstructive pulmonary disease, unspecified: Secondary | ICD-10-CM

## 2023-03-15 DIAGNOSIS — I5032 Chronic diastolic (congestive) heart failure: Secondary | ICD-10-CM

## 2023-03-15 DIAGNOSIS — R0602 Shortness of breath: Secondary | ICD-10-CM

## 2023-03-15 LAB — BASIC METABOLIC PANEL
Anion gap: 11 (ref 5–15)
BUN: 12 mg/dL (ref 8–23)
CO2: 27 mmol/L (ref 22–32)
Calcium: 9.8 mg/dL (ref 8.9–10.3)
Chloride: 100 mmol/L (ref 98–111)
Creatinine, Ser: 0.83 mg/dL (ref 0.44–1.00)
GFR, Estimated: >60 mL/min (ref >60)
Glucose, Bld: 105 mg/dL — ABNORMAL HIGH (ref 70–99)
Potassium: 4.1 mmol/L (ref 3.5–5.1)
Sodium: 138 mmol/L (ref 135–145)

## 2023-03-15 LAB — LIPID PANEL
Cholesterol: 150 mg/dL (ref 0–200)
HDL: 44 mg/dL (ref >40)
LDL Cholesterol: 43 mg/dL (ref 0–99)
Total CHOL/HDL Ratio: 3.4 RATIO
Triglycerides: 315 mg/dL — ABNORMAL HIGH (ref <150)
VLDL: 63 mg/dL — ABNORMAL HIGH (ref 0–40)

## 2023-03-15 LAB — CBC
HCT: 40.6 % (ref 36.0–46.0)
Hemoglobin: 13.6 g/dL (ref 12.0–15.0)
MCH: 30.2 pg (ref 26.0–34.0)
MCHC: 33.5 g/dL (ref 30.0–36.0)
MCV: 90 fL (ref 80.0–100.0)
Platelets: 391 10*3/uL (ref 150–400)
RBC: 4.51 MIL/uL (ref 3.87–5.11)
RDW: 14.2 % (ref 11.5–15.5)
WBC: 13.8 10*3/uL — ABNORMAL HIGH (ref 4.0–10.5)
nRBC: 0 % (ref 0.0–0.2)

## 2023-03-15 LAB — LDL CHOLESTEROL, DIRECT: Direct LDL: 71 mg/dL (ref 0–99)

## 2023-03-15 MED ORDER — ALBUTEROL SULFATE (2.5 MG/3ML) 0.083% IN NEBU
2.5000 mg | INHALATION_SOLUTION | Freq: Once | RESPIRATORY_TRACT | Status: AC
  Filled 2023-03-15: qty 3

## 2023-03-19 ENCOUNTER — Encounter: Payer: Self-pay | Admitting: Family Medicine

## 2023-03-19 ENCOUNTER — Ambulatory Visit (INDEPENDENT_AMBULATORY_CARE_PROVIDER_SITE_OTHER): Payer: Medicare HMO | Admitting: Family Medicine

## 2023-03-19 VITALS — BP 126/68 | HR 80 | Temp 97.4°F | Ht 62.0 in | Wt 224.4 lb

## 2023-03-19 DIAGNOSIS — Z7985 Long-term (current) use of injectable non-insulin antidiabetic drugs: Secondary | ICD-10-CM | POA: Diagnosis not present

## 2023-03-19 DIAGNOSIS — E1169 Type 2 diabetes mellitus with other specified complication: Secondary | ICD-10-CM

## 2023-03-19 DIAGNOSIS — J4540 Moderate persistent asthma, uncomplicated: Secondary | ICD-10-CM

## 2023-03-19 DIAGNOSIS — F3178 Bipolar disorder, in full remission, most recent episode mixed: Secondary | ICD-10-CM | POA: Diagnosis not present

## 2023-03-19 LAB — POCT GLYCOSYLATED HEMOGLOBIN (HGB A1C): Hemoglobin A1C: 6.2 % — AB (ref 4.0–5.6)

## 2023-03-19 MED ORDER — ONETOUCH ULTRASOFT 2 LANCETS MISC: 1.0000 | Freq: Two times a day (BID) | 3 refills | Status: DC

## 2023-03-19 MED ORDER — ONETOUCH ULTRA TEST VI STRP: ORAL_STRIP | 12 refills | Status: DC

## 2023-03-19 NOTE — Assessment & Plan Note (Addendum)
Chronic, great sugar control on metformin and ozempic.  She is currently titrating ozempic dose from 1mg  to 2mg  and seems to be tolerating the increase well.  A1c showing great glycemic control.  Not doing well with OneTouch Verio meter/strips - requests return to [employer] system.

## 2023-03-19 NOTE — Progress Notes (Signed)
Ph: [phone] Fax: [phone]   Patient ID: [name], female    DOB: 1951-04-22, 72 y.o.   MRN: [id number]  This visit was conducted in person.  BP 126/68   Pulse 80   Temp (!) 97.4 F (36.3 C) (Temporal)   Ht 5\' 2"  (1.575 m)   Wt 224 lb 6 oz (101.8 kg)   SpO2 93%   BMI 41.04 kg/m    CC: 4 mo DM f/u visit  Subjective:   HPI: [name] is a 72 y.o. female presenting on 03/19/2023 for Medical Management of Chronic Issues (Here for 4 mo DM f/u.)   COPD followed by Dr Jayme Cloud pulm on Breztri 2 puffs BID with PRN albuterol. She had PFTs repeated. She continues oxygen at 2L/min.   DM - does regularly check sugars - 30d average 180, 14d average 158. Compliant with antihyperglycemic regimen which includes: metformin 500mg  daily, ozempic 1mg  weekly. Was recommended by cardiology to increase to 2mg  weekly after 1 month if 1mg  tolerated. Tolerating well without nausea, diarrhea, constipation, epigastric pain. Denies low sugars or hypoglycemic symptoms. Notes paresthesias to feet, denies blurry vision. Last diabetic eye exam 01/2023. Glucometer brand: one touch verio - she doesn't like this with multiple error messages. She requests one touch ultra strips refilled. Last foot exam: DUE. DSME: completed remotely 2012. Lab Results  Component Value Date   HGBA1C 6.2 (A) 03/19/2023   Diabetic Foot Exam - Simple   Simple Foot Form Diabetic Foot exam was performed with the following findings: Yes 03/19/2023  3:44 PM  Visual Inspection See comments: Yes Sensation Testing Intact to touch and monofilament testing bilaterally: Yes Pulse Check See comments: Yes Comments No claudication Diminished pedal pulses bilaterally Mildly thickened nails bilaterally    Lab Results  Component Value Date   MICROALBUR <0.7 11/08/2022    Bipolar - she continues geodon 40mg  BID.      Relevant past medical, surgical, family and social history reviewed and updated as indicated. Interim  medical history since our last visit reviewed. Allergies and medications reviewed and updated. Outpatient Medications Prior to Visit  Medication Sig Dispense Refill   acetaminophen (TYLENOL 8 HOUR ARTHRITIS PAIN) 650 MG CR tablet Take 650 mg by mouth every 8 (eight) hours as needed for pain.     albuterol (VENTOLIN HFA) 108 (90 Base) MCG/ACT inhaler Inhale 2 puffs into the lungs every 6 (six) hours as needed for wheezing or shortness of breath. 18 g 3   aspirin 81 MG EC tablet Take 1 tablet (81 mg total) by mouth daily.     aspirin-acetaminophen-caffeine (EXCEDRIN MIGRAINE) 250-250-65 MG tablet Take by mouth every 6 (six) hours as needed for headache.     Blood Glucose Monitoring Suppl (ONETOUCH VERIO) w/Device KIT Use as instructed to check blood sugar 2 times a day 1 kit 0   Boswellia-Glucosamine-Vit D (OSTEO BI-FLEX ONE PER DAY PO) Take 1 tablet by mouth daily.     Budeson-Glycopyrrol-Formoterol (BREZTRI AEROSPHERE) 160-9-4.8 MCG/ACT AERO Inhale 2 puffs into the lungs in the morning and at bedtime. 5.9 g 0   Calcium Carb-Cholecalciferol (CALCIUM + VITAMIN D3 PO) Take by mouth daily.     cyclobenzaprine (FLEXERIL) 10 MG tablet TAKE 1/2 TO 1 TABLET BY MOUTH TWICE A DAY AS NEEDED FOR MUSCLE SPASMS AND MIGRAINE 30 tablet 3   famotidine (PEPCID) 20 MG tablet Take 20 mg by mouth every other day. Alternates with omeprazole     gabapentin (NEURONTIN) 100 MG capsule  TAKE 1 TO 3 CAPSULES AT    BEDTIME 200 capsule 1   glucose blood (ONETOUCH VERIO) test strip Use as instructed to check blood sugar 2 times a day 200 each 4   IBUPROFEN PO Take by mouth as needed.     Iron, Ferrous Sulfate, 325 (65 Fe) MG TABS Take 325 mg by mouth once a week.     Lancets (ONETOUCH DELICA PLUS LANCET33G) MISC Use as instructed to check blood sugar 2 times a day 200 each 3   levothyroxine (SYNTHROID) 100 MCG tablet TAKE 1 TABLET DAILY, AND ON2 DAYS A WEEK, TAKE AN     EXTRA 1/2 TABLET 90 tablet 20   loratadine (CLARITIN) 10  MG tablet Take 1 tablet (10 mg total) by mouth daily.     metFORMIN (GLUCOPHAGE) 500 MG tablet Take 1 tablet (500 mg total) by mouth daily with breakfast. 90 tablet 3   metoprolol succinate (TOPROL-XL) 25 MG 24 hr tablet Take 1 tablet (25 mg total) by mouth daily. 90 tablet 4   mometasone (ELOCON) 0.1 % cream Apply 1 Application topically daily as needed (Rash). 45 g 1   Multiple Vitamin (MULTIVITAMIN WITH MINERALS) TABS tablet Take 1 tablet by mouth daily. One A Day for Women     mupirocin ointment (BACTROBAN) 2 % Apply 1 Application topically 2 (two) times daily. 15 g 3   omeprazole (PRILOSEC) 20 MG capsule TAKE 1 CAPSULE EVERY OTHER DAY. ALTERNATE WITH        FAMOTIDINE 20MG  45 capsule 3   OXYGEN Inhale 2 L into the lungs at bedtime.     Rhubarb (ESTROVEN COMPLETE PO) Take 1 tablet by mouth daily.     Semaglutide, 2 MG/DOSE, 8 MG/3ML SOPN Inject 2 mg as directed once a week.     simvastatin (ZOCOR) 40 MG tablet Take 1 tablet (40 mg total) by mouth at bedtime. 90 tablet 4   torsemide (DEMADEX) 20 MG tablet Take 1.5 tablets (30 mg total) by mouth daily. 135 tablet 3   traMADol (ULTRAM) 50 MG tablet TAKE 1 TABLET (50 MG TOTAL) BY MOUTH TWICE A DAY AS NEEDED FOR MODERATE PAIN 30 tablet 1   ziprasidone (GEODON) 40 MG capsule Take 1 capsule (40 mg total) by mouth 2 (two) times daily with a meal. 180 capsule 4   Semaglutide, 1 MG/DOSE, 4 MG/3ML SOPN Inject 1 mg into the skin once a week. 3 mL 1   isosorbide mononitrate (IMDUR) 60 MG 24 hr tablet Take 0.5 tablets (30 mg total) by mouth 2 (two) times daily. 90 tablet 3   Semaglutide,0.25 or 0.5MG /DOS, (OZEMPIC, 0.25 OR 0.5 MG/DOSE,) 2 MG/1.5ML SOPN Inject 0.5 mg into the skin once a week. Via [employer] PAP     Facility-Administered Medications Prior to Visit  Medication Dose Route Frequency Provider Last Rate Last Admin   albuterol (PROVENTIL) (2.5 MG/3ML) 0.083% nebulizer solution 2.5 mg  2.5 mg Nebulization Once Salena Saner, MD         Per  HPI unless specifically indicated in ROS section below Review of Systems  Objective:  BP 126/68   Pulse 80   Temp (!) 97.4 F (36.3 C) (Temporal)   Ht 5\' 2"  (1.575 m)   Wt 224 lb 6 oz (101.8 kg)   SpO2 93%   BMI 41.04 kg/m   Wt Readings from Last 3 Encounters:  03/19/23 224 lb 6 oz (101.8 kg)  01/19/23 222 lb 12.8 oz (101.1 kg)  12/05/22 218 lb  3.2 oz (99 kg)      Physical Exam Vitals and nursing note reviewed.  Constitutional:      Appearance: Normal appearance. She is not ill-appearing.     Comments: Ambulates with rollator  HENT:     Mouth/Throat:     Mouth: Mucous membranes are moist.     Pharynx: Oropharynx is clear. No oropharyngeal exudate or posterior oropharyngeal erythema.  Eyes:     Extraocular Movements: Extraocular movements intact.     Conjunctiva/sclera: Conjunctivae normal.     Pupils: Pupils are equal, round, and reactive to light.  Cardiovascular:     Rate and Rhythm: Normal rate and regular rhythm.     Pulses: Normal pulses.     Heart sounds: Normal heart sounds. No murmur heard. Pulmonary:     Effort: Pulmonary effort is normal. No respiratory distress.     Breath sounds: Normal breath sounds. No wheezing, rhonchi or rales.  Musculoskeletal:     Right lower leg: No edema.     Left lower leg: No edema.     Comments:  See HPI for foot exam if done  Skin:    General: Skin is warm and dry.     Findings: No rash.  Neurological:     Mental Status: She is alert.       Results for orders placed or performed in visit on 03/19/23  POCT glycosylated hemoglobin (Hb A1C)  Result Value Ref Range   Hemoglobin A1C 6.2 (A) 4.0 - 5.6 %   HbA1c POC (<> result, manual entry)     HbA1c, POC (prediabetic range)     HbA1c, POC (controlled diabetic range)      Assessment & Plan:   Problem List Items Addressed This Visit     Type 2 diabetes mellitus with other specified complication (HCC) - Primary    Chronic, great sugar control on metformin and ozempic.   She is currently titrating ozempic dose from 1mg  to 2mg  and seems to be tolerating the increase well.  A1c showing great glycemic control.  Not doing well with OneTouch Verio meter/strips - requests return to [employer] system.       Relevant Medications   Semaglutide, 2 MG/DOSE, 8 MG/3ML SOPN   Other Relevant Orders   POCT glycosylated hemoglobin (Hb A1C) (Completed)   Bipolar disorder (HCC)    She continues Geodon 40mg  BID.       Obesity, morbid, BMI 40.0-49.9 (HCC)    Briefly discussed noted weight gain. Will continue to titrate ozempic as tolerated.       Relevant Medications   Semaglutide, 2 MG/DOSE, 8 MG/3ML SOPN   Asthma    Appreciate pulm care. Recent PFTs showing reversible obstructive airway disease more consistent with asthma. She continues breztri a2 puffs BID with benefit.         Meds ordered this encounter  Medications   glucose blood (ONETOUCH ULTRA TEST) test strip    Sig: Use as instructed to check sugars twice daily E11.69    Dispense:  100 each    Refill:  12    Verio not functioning well with multiple error messages - she does better with Ultra   OneTouch UltraSoft 2 Lancets MISC    Sig: 1 Lancet by Subconjunctival route in the morning and at bedtime.    Dispense:  100 each    Refill:  3    Orders Placed This Encounter  Procedures   POCT glycosylated hemoglobin (Hb A1C)  Patient Instructions  Double check on ozempic dose - you should have 1mg  pens for 1 month then 2mg  pens onward.  We will send in one touch ultra strips and see if insurance covers this.  You are doing well today Continue current regimen   Follow up plan: Return in about 4 months (around 07/20/2023) for follow up visit.  Eustaquio Boyden, MD

## 2023-03-19 NOTE — Assessment & Plan Note (Signed)
She continues Geodon 40mg  BID.

## 2023-03-19 NOTE — Assessment & Plan Note (Signed)
Briefly discussed noted weight gain. Will continue to titrate ozempic as tolerated.

## 2023-03-19 NOTE — Patient Instructions (Addendum)
Double check on ozempic dose - you should have 1mg  pens for 1 month then 2mg  pens onward.  We will send in one touch ultra strips and see if insurance covers this.  You are doing well today Continue current regimen

## 2023-03-19 NOTE — Assessment & Plan Note (Signed)
Appreciate pulm care. Recent PFTs showing reversible obstructive airway disease more consistent with asthma. She continues breztri a2 puffs BID with benefit.

## 2023-03-21 ENCOUNTER — Ambulatory Visit: Payer: Medicare HMO | Admitting: Pulmonary Disease

## 2023-03-21 ENCOUNTER — Encounter: Payer: Self-pay | Admitting: Pulmonary Disease

## 2023-03-21 VITALS — BP 120/70 | HR 71 | Temp 97.9°F | Ht 62.0 in | Wt 220.3 lb

## 2023-03-21 DIAGNOSIS — J449 Chronic obstructive pulmonary disease, unspecified: Secondary | ICD-10-CM

## 2023-03-21 DIAGNOSIS — R0602 Shortness of breath: Secondary | ICD-10-CM

## 2023-03-21 DIAGNOSIS — G4736 Sleep related hypoventilation in conditions classified elsewhere: Secondary | ICD-10-CM | POA: Diagnosis not present

## 2023-03-21 DIAGNOSIS — E669 Obesity, unspecified: Secondary | ICD-10-CM

## 2023-03-21 DIAGNOSIS — Z87891 Personal history of nicotine dependence: Secondary | ICD-10-CM | POA: Diagnosis not present

## 2023-03-21 MED ORDER — ALBUTEROL SULFATE HFA 108 (90 BASE) MCG/ACT IN AERS: 2.0000 | INHALATION_SPRAY | Freq: Four times a day (QID) | RESPIRATORY_TRACT | 3 refills | Status: DC | PRN

## 2023-03-21 NOTE — Patient Instructions (Signed)
Your breathing tests were excellent.  Continue using your oxygen at nighttime.  We sent in a prescription for albuterol for you.  We will see him in follow-up in 4 to 6 months time call sooner should any new problems arise.

## 2023-03-21 NOTE — Progress Notes (Signed)
Subjective:    Patient ID: [name], female    DOB: 04/08/1951, 72 y.o.   MRN: [id number]  Patient Care Team: Eustaquio Boyden, MD as PCP - General (Family Medicine) Debbe Odea, MD as PCP - Cardiology (Cardiology) Carrie Mew, OD as Consulting Physician (Optometry) Kathyrn Sheriff, [place] (Inactive) as Pharmacist (Pharmacist) Salena Saner, MD as Consulting Physician (Pulmonary Disease)  Chief Complaint  Patient presents with   Follow-up    DOE. No wheezing or cough.     HPI [name] is a 72 year old former smoker with an 86-pack-year history of smoking who presents for follow-up on the issue of dyspnea, nocturnal hypoxemia and COPD.  She was last seen here on 05 December 2022.  At that time she was instructed to continue oxygen at nighttime and PFTs were ordered.  In addition she was reenrolled in the lung cancer screening program that she had lapsed from.  She had function testing on 15 March 2023 and these continue to show minimal obstructive lung disease with normal diffusion capacity.  Her dyspnea has been out of proportion to PFTs.She had lung cancer screening imaging performed on 25 January 2023 showing a lung RADS 2 study with no concerning findings.  She has significant issues with obesity and deconditioning and this is the main driver for her dyspnea. She is on nocturnal oxygen for hypoxia related to obesity. She has been noted to have very mild sleep apnea which responds to positional change so no equipment has been recommended.  She has not had any issues with wheezing or cough.  She is compliant with her nocturnal oxygen at 2 L/min and notes benefit of therapy.  She is compliant with Breztri 2 puffs twice a day and uses albuterol an average of 2 times per week.  She has not had any fevers, chills or sweats.  No cough or sputum production.  We discussed PFT results with the patient today.   She presents today in a transport chair due to difficulties  ambulating from knee pain.  We discussed pulmonary rehab again but she declines this due to her issues with her knees.  She requests those for albuterol today.   Review of Systems A 10 point review of systems was performed and it is as noted above otherwise negative.   Patient Active Problem List   Diagnosis Date Noted   Elevated vitamin B12 level 11/17/2022   Iron deficiency 11/17/2022   Chest pain 02/23/2022   Postural dizziness with presyncope 02/13/2022   Oral thrush 11/13/2021   Lymphadenopathy of right cervical region 11/13/2021   Skin lesion of breast 11/13/2021   Unilateral occipital headache 01/10/2021   Abdominal aortic ectasia (HCC) 10/09/2020   Back pain 09/27/2020   Gross hematuria 09/14/2020   S/P CABG x 1 08/03/2020   Abnormal cardiovascular stress test    Aortic atherosclerosis (HCC) 04/15/2020   Asthma 04/15/2020   Pill dysphagia 04/05/2020   Cervical neck pain with evidence of disc disease 10/23/2019   Osteoarthritis 10/23/2019   Anemia 10/23/2019   Weight loss, unintentional 10/05/2019   Hepatic steatosis 05/03/2019   Thrombocytosis 04/24/2019   Abdominal pain 04/24/2019   Migraine 04/19/2018   Obesity, morbid, BMI 40.0-49.9 (HCC) 08/17/2017   Palpitations 01/08/2017   Mass of right side of neck 07/10/2016   Neutrophilia 04/30/2015   Advanced care planning/counseling discussion 01/22/2015   Health maintenance examination 01/22/2015   Medicare annual wellness visit, subsequent 12/15/2013   Tachycardia 09/13/2012   Osteoarthritis of  left knee 12/11/2011   Candidal intertrigo 05/10/2011   Hypertension    Bipolar disorder (HCC)    Fibromyalgia    Hyperlipidemia associated with type 2 diabetes mellitus (HCC)    GERD (gastroesophageal reflux disease)    Hypothyroidism 04/05/2011   OSA (obstructive sleep apnea) 03/16/2011   CAD (coronary artery disease) 03/16/2011   Type 2 diabetes mellitus with other specified complication (HCC) 03/15/2011   Dyspnea  01/03/2011    Social History   Tobacco Use   Smoking status: Former    Current packs/day: 0.00    Average packs/day: 2.0 packs/day for 43.0 years (86.0 ttl pk-yrs)    Types: Cigarettes    Start date: 05/18/1967    Quit date: 05/17/2010    Years since quitting: 12.8   Smokeless tobacco: Never   Tobacco comments:    QUIT 2011  Substance Use Topics   Alcohol use: No    Alcohol/week: 0.0 standard drinks of alcohol    Allergies  Allergen Reactions   Crestor [Rosuvastatin] Other (See Comments)    myalgias   Lipitor [Atorvastatin] Other (See Comments)    Mood swings   Lithium Other (See Comments)    abd pain, n/v, was hospitalized   Quetiapine Other (See Comments)    Muscle twitching, muscle spasm    Current Meds  Medication Sig   acetaminophen (TYLENOL 8 HOUR ARTHRITIS PAIN) 650 MG CR tablet Take 650 mg by mouth every 8 (eight) hours as needed for pain.   albuterol (VENTOLIN HFA) 108 (90 Base) MCG/ACT inhaler Inhale 2 puffs into the lungs every 6 (six) hours as needed for wheezing or shortness of breath.   aspirin 81 MG EC tablet Take 1 tablet (81 mg total) by mouth daily.   aspirin-acetaminophen-caffeine (EXCEDRIN MIGRAINE) 250-250-65 MG tablet Take by mouth every 6 (six) hours as needed for headache.   Blood Glucose Monitoring Suppl (ONETOUCH VERIO) w/Device KIT Use as instructed to check blood sugar 2 times a day   Boswellia-Glucosamine-Vit D (OSTEO BI-FLEX ONE PER DAY PO) Take 1 tablet by mouth daily.   Budeson-Glycopyrrol-Formoterol (BREZTRI AEROSPHERE) 160-9-4.8 MCG/ACT AERO Inhale 2 puffs into the lungs in the morning and at bedtime.   Calcium Carb-Cholecalciferol (CALCIUM + VITAMIN D3 PO) Take by mouth daily.   cyclobenzaprine (FLEXERIL) 10 MG tablet TAKE 1/2 TO 1 TABLET BY MOUTH TWICE A DAY AS NEEDED FOR MUSCLE SPASMS AND MIGRAINE   famotidine (PEPCID) 20 MG tablet Take 20 mg by mouth every other day. Alternates with omeprazole   gabapentin (NEURONTIN) 100 MG capsule  TAKE 1 TO 3 CAPSULES AT    BEDTIME   glucose blood (ONETOUCH ULTRA TEST) test strip Use as instructed to check sugars twice daily E11.69   glucose blood (ONETOUCH VERIO) test strip Use as instructed to check blood sugar 2 times a day   IBUPROFEN PO Take by mouth as needed.   Iron, Ferrous Sulfate, 325 (65 Fe) MG TABS Take 325 mg by mouth once a week.   Lancets (ONETOUCH DELICA PLUS LANCET33G) MISC Use as instructed to check blood sugar 2 times a day   levothyroxine (SYNTHROID) 100 MCG tablet TAKE 1 TABLET DAILY, AND ON2 DAYS A WEEK, TAKE AN     EXTRA 1/2 TABLET   loratadine (CLARITIN) 10 MG tablet Take 1 tablet (10 mg total) by mouth daily.   metFORMIN (GLUCOPHAGE) 500 MG tablet Take 1 tablet (500 mg total) by mouth daily with breakfast.   metoprolol succinate (TOPROL-XL) 25 MG 24 hr tablet Take  1 tablet (25 mg total) by mouth daily.   mometasone (ELOCON) 0.1 % cream Apply 1 Application topically daily as needed (Rash).   Multiple Vitamin (MULTIVITAMIN WITH MINERALS) TABS tablet Take 1 tablet by mouth daily. One A Day for Women   mupirocin ointment (BACTROBAN) 2 % Apply 1 Application topically 2 (two) times daily.   omeprazole (PRILOSEC) 20 MG capsule TAKE 1 CAPSULE EVERY OTHER DAY. ALTERNATE WITH        FAMOTIDINE 20MG    OneTouch UltraSoft 2 Lancets MISC 1 Lancet by Subconjunctival route in the morning and at bedtime.   OXYGEN Inhale 2 L into the lungs at bedtime.   Rhubarb (ESTROVEN COMPLETE PO) Take 1 tablet by mouth daily.   Semaglutide, 2 MG/DOSE, 8 MG/3ML SOPN Inject 2 mg as directed once a week.   simvastatin (ZOCOR) 40 MG tablet Take 1 tablet (40 mg total) by mouth at bedtime.   torsemide (DEMADEX) 20 MG tablet Take 1.5 tablets (30 mg total) by mouth daily.   traMADol (ULTRAM) 50 MG tablet TAKE 1 TABLET (50 MG TOTAL) BY MOUTH TWICE A DAY AS NEEDED FOR MODERATE PAIN   ziprasidone (GEODON) 40 MG capsule Take 1 capsule (40 mg total) by mouth 2 (two) times daily with a meal.     Immunization History  Administered Date(s) Administered   COVID-19, mRNA, vaccine(Comirnaty)12 years and older 07/15/2022   Fluad Quad(high Dose 65+) 04/24/2019, 05/26/2020, 05/16/2022   Influenza Split 05/09/2011, 06/11/2012   Influenza Whole 05/20/2010   Influenza, High Dose Seasonal PF 06/01/2021   Influenza,inj,Quad PF,6+ Mos 07/14/2013, 06/16/2014, 04/30/2015, 05/31/2016, 05/10/2017, 04/19/2018   Moderna Covid-19 Vaccine Bivalent Booster 60yrs & up 06/24/2021   Moderna SARS-COV2 Booster Vaccination 06/21/2020, 12/31/2020   Moderna Sars-Covid-2 Vaccination 10/18/2019, 11/15/2019   Pneumococcal Conjugate-13 11/14/2017, 04/19/2018   Pneumococcal Polysaccharide-23 04/18/2011, 06/01/2021   Respiratory Syncytial Virus Vaccine,Recomb Aduvanted(Arexvy) 07/15/2022   Td 04/18/2011   Zoster Recombinant(Shingrix) 11/14/2017, 01/02/2018   Zoster, Live 12/29/2013        Objective:     BP 120/70 (BP Location: Left Arm, Cuff Size: Large)   Pulse 71   Temp 97.9 F (36.6 C)   Ht 5\' 2"  (1.575 m)   Wt 220 lb 4.8 oz (99.9 kg) Comment: per patient. in a wheelchair today  SpO2 95%   BMI 40.29 kg/m   SpO2: 95 % O2 Device: None (Room air)  GENERAL: Obese woman, presents in transport chair.  No conversational dyspnea.  Looks older than stated age.  Looks very deconditioned. HEAD: Normocephalic, atraumatic. EYES: PERRL, anicteric sclera. MOUTH: Edentulous, oral mucosa moist. NECK: Supple. No thyromegaly. Trachea midline. No JVD.  No adenopathy.  Prominent dorsocervical fat pad posteriorly. PULMONARY: Good air entry bilaterally.  No adventitious sounds. CARDIOVASCULAR: S1 and S2. Regular rate and rhythm.  No rubs, murmurs or gallops heard.   ABDOMEN: Obese, nondistended. MUSCULOSKELETAL: No joint deformity, no clubbing, no edema.  Median sternotomy scar well-healed. NEUROLOGIC: No overt focal deficit.  Speech is fluent. SKIN: Intact,warm,dry. PSYCH: Mood and behavior normal    Assessment & Plan:     ICD-10-CM   1. Stage 1 mild COPD by GOLD classification (HCC)  J44.9    New Breztri and as needed albuterol Refills for albuterol sent today Well compensated    2. Nocturnal hypoxemia due to obesity  E66.9    G47.36    Compliant with oxygen at 2 L/min Continue same Notes benefit of therapy    3. Shortness of breath  R06.02  At baseline Sleep related to obesity/deconditioning Out of proportion to mild PFT defect    4. Former heavy cigarette smoker (20-39 per day)  Z87.891    Enrolled in lung cancer screening program No evidence of relapse     Meds ordered this encounter  Medications   albuterol (VENTOLIN HFA) 108 (90 Base) MCG/ACT inhaler    Sig: Inhale 2 puffs into the lungs every 6 (six) hours as needed for wheezing or shortness of breath.    Dispense:  18 g    Refill:  3    Follow-up in 4 to 6 months time she is to call sooner should any new problems arise.   Gailen Shelter, MD Advanced Bronchoscopy PCCM [facility]    *This note was dictated using voice recognition software/Dragon.  Despite best efforts to proofread, errors can occur which can change the meaning. Any transcriptional errors that result from this process are unintentional and may not be fully corrected at the time of dictation.

## 2023-03-23 ENCOUNTER — Encounter: Payer: Self-pay | Admitting: Pulmonary Disease

## 2023-03-23 ENCOUNTER — Telehealth: Payer: Self-pay | Admitting: Family Medicine

## 2023-03-23 NOTE — Telephone Encounter (Signed)
[facility]  called in need to clarify pt Rx lancet  # [phone]  # [phone]

## 2023-03-26 ENCOUNTER — Telehealth: Payer: Self-pay | Admitting: Family Medicine

## 2023-03-26 DIAGNOSIS — E1169 Type 2 diabetes mellitus with other specified complication: Secondary | ICD-10-CM

## 2023-03-26 MED ORDER — ONETOUCH DELICA PLUS LANCET33G MISC: 3 refills | Status: DC

## 2023-03-26 NOTE — Telephone Encounter (Signed)
Issue resolved. (See 03/26/23 phn note.)

## 2023-03-26 NOTE — Telephone Encounter (Signed)
Pharmacy called in stating that patient received incorrect lancets.Patient would need the One Touch Delica Plus 30 gage sent in for her.  ZO#[phone]  WJX#[phone]

## 2023-03-26 NOTE — Telephone Encounter (Signed)
E-scribed correct lancet refill to [facility].

## 2023-03-29 ENCOUNTER — Telehealth: Payer: Self-pay | Admitting: Pulmonary Disease

## 2023-03-29 MED ORDER — ALBUTEROL SULFATE HFA 108 (90 BASE) MCG/ACT IN AERS: 2.0000 | INHALATION_SPRAY | Freq: Four times a day (QID) | RESPIRATORY_TRACT | 3 refills | Status: AC | PRN

## 2023-03-29 NOTE — Telephone Encounter (Signed)
[facility]  Patient is aware and voiced her understanding.  Nothing further needed.

## 2023-04-07 DIAGNOSIS — J449 Chronic obstructive pulmonary disease, unspecified: Secondary | ICD-10-CM | POA: Diagnosis not present

## 2023-04-18 ENCOUNTER — Encounter: Payer: Medicare HMO | Admitting: Pharmacist

## 2023-04-28 ENCOUNTER — Other Ambulatory Visit: Payer: Self-pay | Admitting: Nurse Practitioner

## 2023-05-08 DIAGNOSIS — J449 Chronic obstructive pulmonary disease, unspecified: Secondary | ICD-10-CM | POA: Diagnosis not present

## 2023-05-25 ENCOUNTER — Other Ambulatory Visit: Payer: Self-pay | Admitting: Family Medicine

## 2023-05-25 DIAGNOSIS — M797 Fibromyalgia: Secondary | ICD-10-CM

## 2023-05-25 NOTE — Telephone Encounter (Signed)
Does not look like patient should need refill until November. I have called patient she is taking 2 in the am and 1 at night every day.

## 2023-06-07 DIAGNOSIS — J449 Chronic obstructive pulmonary disease, unspecified: Secondary | ICD-10-CM | POA: Diagnosis not present

## 2023-06-12 ENCOUNTER — Encounter: Payer: Self-pay | Admitting: Family Medicine

## 2023-07-06 NOTE — Telephone Encounter (Signed)
[name], will you please check into this for the pt? She gets Ozempic via [employer] PAP.

## 2023-07-08 DIAGNOSIS — J449 Chronic obstructive pulmonary disease, unspecified: Secondary | ICD-10-CM | POA: Diagnosis not present

## 2023-07-09 ENCOUNTER — Other Ambulatory Visit: Payer: Self-pay | Admitting: Pharmacist

## 2023-07-09 ENCOUNTER — Encounter: Payer: Self-pay | Admitting: Pharmacist

## 2023-07-09 DIAGNOSIS — E1169 Type 2 diabetes mellitus with other specified complication: Secondary | ICD-10-CM

## 2023-07-09 MED ORDER — SEMAGLUTIDE (2 MG/DOSE) 8 MG/3ML ~~LOC~~ SOPN
2.0000 mg | PEN_INJECTOR | SUBCUTANEOUS | 0 refills | Status: DC
Start: 2023-07-09 — End: 2023-08-07

## 2023-07-09 NOTE — Progress Notes (Signed)
ERx 

## 2023-07-09 NOTE — Progress Notes (Signed)
[employer] [facility] regarding patient's Ozempic shipment.   The order is currently in process, though per chart record, looks like the last shipment we received was on 02/13/23 for a 3-month supply of Ozempic.   02/13/23 Received pt's med shipment of Ozempic 1 mg (2 boxes total) and 2 mg (2 boxes total)- 4 boxes all together.      Current refill that is in process is technically weeks overdue. Spoke to [employer] to request medication voucher coupon, as patient has asked for [place] to send a refill to her local pharmacy. Fear that cost may be a barrier though unclear what her copay is at this time.    [facility] [facility] representative states:  Prescription sent initially was for a titration (eg. 1 mg and 2 mg pens) and therefore they could not fulfill the refill. They state that they would have needed a refill/reorder form indicating 2 mg maintenance dose.  For this reason, they are not providing a voucher for this patient.   Medication order form filled out for PCP, faxed to clinic for PCP signature with instruction to fax to [employer] upon completion.  [employer] Fax: [phone].   Rx pended to home pharmacy to see what copay would be for 1 pen.   Loree Fee, PharmD Clinical Pharmacist [facility] [phone]

## 2023-07-10 ENCOUNTER — Other Ambulatory Visit: Payer: Self-pay | Admitting: Family Medicine

## 2023-07-10 DIAGNOSIS — Z1231 Encounter for screening mammogram for malignant neoplasm of breast: Secondary | ICD-10-CM

## 2023-07-23 ENCOUNTER — Encounter: Payer: Self-pay | Admitting: Cardiology

## 2023-07-23 ENCOUNTER — Ambulatory Visit: Payer: Medicare HMO | Admitting: Family Medicine

## 2023-07-23 ENCOUNTER — Ambulatory Visit: Payer: Medicare HMO | Attending: Cardiology | Admitting: Cardiology

## 2023-07-23 ENCOUNTER — Encounter: Payer: Self-pay | Admitting: Family Medicine

## 2023-07-23 VITALS — BP 100/60 | HR 77 | Ht 62.0 in | Wt 218.8 lb

## 2023-07-23 DIAGNOSIS — I25118 Atherosclerotic heart disease of native coronary artery with other forms of angina pectoris: Secondary | ICD-10-CM | POA: Diagnosis not present

## 2023-07-23 DIAGNOSIS — I10 Essential (primary) hypertension: Secondary | ICD-10-CM | POA: Diagnosis not present

## 2023-07-23 DIAGNOSIS — R072 Precordial pain: Secondary | ICD-10-CM

## 2023-07-23 DIAGNOSIS — E785 Hyperlipidemia, unspecified: Secondary | ICD-10-CM

## 2023-07-23 MED ORDER — RANOLAZINE ER 500 MG PO TB12: 500.0000 mg | ORAL_TABLET | Freq: Two times a day (BID) | ORAL | 3 refills | Status: DC

## 2023-07-23 NOTE — Patient Instructions (Signed)
Medication Instructions:  Your physician has recommended you make the following change in your medication:   START Ranexa 500 mg twice a day  *If you need a refill on your cardiac medications before your next appointment, please call your pharmacy*   Lab Work: None  If you have labs (blood work) drawn today and your tests are completely normal, you will receive your results only by: MyChart Message (if you have MyChart) OR A paper copy in the mail If you have any lab test that is abnormal or we need to change your treatment, we will call you to review the results.   Testing/Procedures:    Please report to Radiology at [facility] Main Entrance, medical mall, 30 mins prior to your test.  [address]  [place], [place]  [phone]  How to Prepare for Your Cardiac PET/CT Stress Test:   Medication instructions: Do not take erectile dysfunction medications for 72 hours prior to test (sildenafil, tadalafil) Do not take nitrates (isosorbide mononitrate, Ranexa) the day before or day of test Do not take tamsulosin the day before or morning of test Hold theophylline containing medications for 12 hours. Hold Dipyridamole 48 hours prior to the test.     Diabetic Preparation: If able to eat breakfast prior to 3 hour fasting, you may take all medications, including your insulin. Do not worry if you miss your breakfast dose of insulin - start at your next meal. If you do not eat prior to 3 hour fast-Hold all diabetes (oral and insulin) medications. Patients who wear a continuous glucose monitor MUST remove the device prior to scanning.  You may take your remaining medications with water.  2. Nothing to eat or drink, except water, 3 hours prior to arrival time.   NO caffeine/decaffeinated products, or chocolate 12 hours prior to arrival. (Please note decaffeinated beverages (teas/coffees) still contain caffeine).  If you have caffeine within 12 hours prior,  the test will need to be rescheduled.   3. NO perfume, cologne or lotion on chest or abdomen area. FEMALES - Please avoid wearing dresses to this appointment.  4. Total time is 1 to 2 hours; you may want to bring reading material for the waiting time.  IF YOU THINK YOU MAY BE PREGNANT, OR ARE NURSING PLEASE INFORM THE TECHNOLOGIST.  In preparation for your appointment, medication and supplies will be purchased.  Appointment availability is limited, so if you need to cancel or reschedule, please call the Radiology Department at [phone] [facility]) OR [phone] [facility]) 24 hours in advance to avoid a cancellation fee of $100.00  What to Expect When you Arrive:  Once you arrive and check in for your appointment, you will be taken to a preparation room within the Radiology Department.  A technologist or Nurse will obtain your medical history, verify that you are correctly prepped for the exam, and explain the procedure.  Afterwards, an IV will be started in your arm and electrodes will be placed on your skin for EKG monitoring during the stress portion of the exam. Then you will be escorted to the PET/CT scanner.  There, staff will get you positioned on the scanner and obtain a blood pressure and EKG.  During the exam, you will continue to be connected to the EKG and blood pressure machines.  A small, safe amount of a radioactive tracer will be injected in your IV to obtain a series of pictures of your heart along with an injection of a stress  agent.    After your Exam:  It is recommended that you eat a meal and drink a caffeinated beverage to counter act any effects of the stress agent.  Drink plenty of fluids for the remainder of the day and urinate frequently for the first couple of hours after the exam.  Your doctor will inform you of your test results within 7-10 business days.  For more information and frequently asked questions, please visit our  website: [email or web address]  For questions about your test or how to prepare for your test, please call: Cardiac Imaging Nurse Navigators Office: [phone]    Follow-Up: At [facility], you and your health needs are our priority.  As part of our continuing mission to provide you with exceptional heart care, we have created designated Provider Care Teams.  These Care Teams include your primary Cardiologist (physician) and Advanced Practice Providers (APPs -  Physician Assistants and Nurse Practitioners) who all work together to provide you with the care you need, when you need it.   Your next appointment:   2-3 month(s)  Provider:   Debbe Odea, MD

## 2023-07-23 NOTE — Progress Notes (Signed)
Cardiology Office Note:    Date:  07/23/2023   ID:  [name], DOB Jan 10, 1951, MRN [id number]  PCP:  Jessica Boyden, MD  [name] Cardiologist:  Debbe Odea, MD  [facility] Electrophysiologist:  None   Referring MD: Jessica Boyden, MD   Chief Complaint  Patient presents with   Follow-up    Patient reports an increase in intermittent left side chest pian especially on exertion.      History of Present Illness:    [name] is a 72 y.o. female with a hx of CAD/CABG x 1 (LIMA-LAD, 07/2020), hypertension, hyperlipidemia, diabetes, former smoker x40 years, COPD, OSA, morbid obesity who presents for follow-up.  Endorses chest pain with minimal exertion, lost some weight since starting Ozempic.  Compliant with Toprol XL and Imdur as prescribed.  BP low normal.  Imdur previously reduced to 30 mg daily due to low normal BPs.  States undergoing some Stressors at home with taking care of her husband who is wheelchair-bound.    Prior notes Echo 03/2022 EF 50 to 55% Lexiscan Myoview 03/2022 limited by artifact. Intraoperative TEE, EF approximately 50%. Right and left heart cath 06/2020 single-vessel CAD sequential 70% ostial, 90% mid LAD. CAD/CABG x1 LIMA to LAD 07/2020 Lexiscan Myoview 11/09/2020 with no significant ischemia, fixed apical defect.   Past Medical History:  Diagnosis Date   Anxiety    Arthritis    per patient "all over body"   Bipolar disorder [name])    has stopped seeing psychiatrist   CAD (coronary artery disease)    a. 06/2020 MV: small, sev, apical inf/apical defect-scar vs ischemia. EF 45%; b. 06/2020 Cath: LM 18m, LAD 70ost, 51m, LCX nl, RCA nl; c. 07/2020 CABG x 1: LIMA->LAD; d. 03/2022 MV: Significantly degraded imaging due to breast attenuation/obesity/extracardiac activity. No obvious isch/scar. EF 30-35% (EF 50-55% w/o rwma on f/u echo).   Cataract    Chronic bronchitis    COPD (chronic obstructive pulmonary disease) (HCC)     Depression    Dyspnea 2012   s/p pulm/cards w/u WNL, thought due to obesity/deconditioning   Fibromyalgia    GERD (gastroesophageal reflux disease)    HFmrEF (heart failure with mid-range ejection fraction) (HCC)    a. 05/2020 Echo: EF 40%, glob HK, mild LVH, Gr1 DD; b. 06/2020 EF by SPECT: 45%; c. 07/2020 intraop TEE: EF 50%; d. 03/2022 Echo: EF 50-55%, no rwma, GrI DD, mild LVH, nl RV fxn, mild MR.   History of chicken pox    HLD (hyperlipidemia)    Hypertension    Hypothyroidism    Ischemic cardiomyopathy    a. 05/2020 Echo: EF 40%; b. 06/2020 EF by SPECT: 45%; c. 07/2020 intraop TEE: EF 50%.   Migraine    OSA (obstructive sleep apnea) 03/16/2011   Pneumonia    Sleep apnea    per patient cannot tolerate CPAP   T2DM (type 2 diabetes mellitus) (HCC) 2012   DM education 06/2011   Urinary incontinence     Past Surgical History:  Procedure Laterality Date   BREAST CYST ASPIRATION Right 2003   cardiac catherization  03/2011   x3 Dicie Beam), mild nonobstructive CAD   COLONOSCOPY  12/2011   hyperplastic polyp x2,. diverticulosis, rec rpt 10 yrs   CORONARY ARTERY BYPASS GRAFT N/A 08/03/2020   Procedure: CORONARY ARTERY BYPASS GRAFTING TIMES ONE USING LEFT INTERNAL MAMMARY ARTERY.;  Surgeon: Alleen Borne, MD;  Location: [facility] OR;  Service: Open Heart Surgery;  Laterality: N/A;   ESOPHAGOGASTRODUODENOSCOPY  2006   ESOPHAGOGASTRODUODENOSCOPY  05/2020   no esophageal abnormality to explain dysphagia - esophagus dilated. Erosive gastropathy - neg H pylori [name])   KNEE SURGERY  2006   left, torn meniscus   RIGHT/LEFT HEART CATH AND CORONARY ANGIOGRAPHY N/A 07/13/2020   Procedure: RIGHT/LEFT HEART CATH AND CORONARY ANGIOGRAPHY;  Surgeon: Yvonne Kendall, MD;  Location: [facility] INVASIVE CV LAB;  Service: Cardiovascular;  Laterality: N/A;   TEE WITHOUT CARDIOVERSION N/A 08/03/2020   Procedure: TRANSESOPHAGEAL ECHOCARDIOGRAM (TEE);  Surgeon: Alleen Borne, MD;  Location: [facility] OR;  Service: Open  Heart Surgery;  Laterality: N/A;   TOTAL ABDOMINAL HYSTERECTOMY  2004   fibroids, cervix remained   TOTAL KNEE ARTHROPLASTY Left 08/2012   [name], [facility]    Current Medications: Current Meds  Medication Sig   acetaminophen (TYLENOL 8 HOUR ARTHRITIS PAIN) 650 MG CR tablet Take 650 mg by mouth every 8 (eight) hours as needed for pain.   albuterol (VENTOLIN HFA) 108 (90 Base) MCG/ACT inhaler Inhale 2 puffs into the lungs every 6 (six) hours as needed for wheezing or shortness of breath.   aspirin 81 MG EC tablet Take 1 tablet (81 mg total) by mouth daily.   aspirin-acetaminophen-caffeine (EXCEDRIN MIGRAINE) 250-250-65 MG tablet Take by mouth every 6 (six) hours as needed for headache.   Blood Glucose Monitoring Suppl (ONETOUCH VERIO) w/Device KIT Use as instructed to check blood sugar 2 times a day   Boswellia-Glucosamine-Vit D (OSTEO BI-FLEX ONE PER DAY PO) Take 1 tablet by mouth daily.   Budeson-Glycopyrrol-Formoterol (BREZTRI AEROSPHERE) 160-9-4.8 MCG/ACT AERO Inhale 2 puffs into the lungs in the morning and at bedtime.   Calcium Carb-Cholecalciferol (CALCIUM + VITAMIN D3 PO) Take by mouth daily.   cyclobenzaprine (FLEXERIL) 10 MG tablet TAKE 1/2 TO 1 TABLET BY MOUTH TWICE A DAY AS NEEDED FOR MUSCLE SPASMS AND MIGRAINE   famotidine (PEPCID) 20 MG tablet Take 20 mg by mouth every other day. Alternates with omeprazole   gabapentin (NEURONTIN) 100 MG capsule TAKE 1 TO 3 CAPSULES AT    BEDTIME   glucose blood (ONETOUCH ULTRA TEST) test strip Use as instructed to check sugars twice daily E11.69   glucose blood (ONETOUCH VERIO) test strip Use as instructed to check blood sugar 2 times a day   IBUPROFEN PO Take by mouth as needed.   Iron, Ferrous Sulfate, 325 (65 Fe) MG TABS Take 325 mg by mouth once a week.   isosorbide mononitrate (IMDUR) 60 MG 24 hr tablet TAKE 0.5 TABLETS (30 MG TOTAL) BY MOUTH 2 (TWO) TIMES DAILY.   Lancets (ONETOUCH DELICA PLUS LANCET33G) MISC Use as instructed to  check blood sugar 2 times a day   levothyroxine (SYNTHROID) 100 MCG tablet TAKE 1 TABLET DAILY, AND ON2 DAYS A WEEK, TAKE AN     EXTRA 1/2 TABLET   loratadine (CLARITIN) 10 MG tablet Take 1 tablet (10 mg total) by mouth daily.   metFORMIN (GLUCOPHAGE) 500 MG tablet Take 1 tablet (500 mg total) by mouth daily with breakfast.   metoprolol succinate (TOPROL-XL) 25 MG 24 hr tablet Take 1 tablet (25 mg total) by mouth daily.   mometasone (ELOCON) 0.1 % cream Apply 1 Application topically daily as needed (Rash).   Multiple Vitamin (MULTIVITAMIN WITH MINERALS) TABS tablet Take 1 tablet by mouth daily. One A Day for Women   mupirocin ointment (BACTROBAN) 2 % Apply 1 Application topically 2 (two) times daily.   omeprazole (PRILOSEC) 20 MG capsule TAKE 1 CAPSULE EVERY OTHER  DAY. ALTERNATE WITH        FAMOTIDINE 20MG    OXYGEN Inhale 2 L into the lungs at bedtime.   ranolazine (RANEXA) 500 MG 12 hr tablet Take 1 tablet (500 mg total) by mouth 2 (two) times daily.   Rhubarb (ESTROVEN COMPLETE PO) Take 1 tablet by mouth daily.   Semaglutide, 2 MG/DOSE, 8 MG/3ML SOPN Inject 2 mg as directed once a week.   simvastatin (ZOCOR) 40 MG tablet Take 1 tablet (40 mg total) by mouth at bedtime.   torsemide (DEMADEX) 20 MG tablet Take 1.5 tablets (30 mg total) by mouth daily.   traMADol (ULTRAM) 50 MG tablet TAKE 1 TABLET (50 MG TOTAL) BY MOUTH TWICE A DAY AS NEEDED FOR MODERATE PAIN   ziprasidone (GEODON) 40 MG capsule Take 1 capsule (40 mg total) by mouth 2 (two) times daily with a meal.     Allergies:   Crestor [rosuvastatin], Lipitor [atorvastatin], Lithium, and Quetiapine   Social History   Socioeconomic History   Marital status: Married    Spouse name: Not on file   Number of children: Not on file   Years of education: Not on file   Highest education level: 12th grade  Occupational History   Occupation: disabled    Employer: OTHER  Tobacco Use   Smoking status: Former    Current packs/day: 0.00     Average packs/day: 2.0 packs/day for 43.0 years (86.0 ttl pk-yrs)    Types: Cigarettes    Start date: 05/18/1967    Quit date: 05/17/2010    Years since quitting: 13.1   Smokeless tobacco: Never   Tobacco comments:    QUIT 2011  Vaping Use   Vaping status: Never Used  Substance and Sexual Activity   Alcohol use: No    Alcohol/week: 0.0 standard drinks of alcohol   Drug use: No   Sexual activity: Yes    Partners: Male  Other Topics Concern   Not on file  Social History Narrative   Caffeine: 2-3 coffee/am, 1 cup tea in afternoon; Lives with husband, 1 dog, 2 cats; Occupation: disability since 2009 for bipolar, previously worked at [employer]; Diet: not many fruits, good vegetables, good amt water.      Smoker 43 years; quit in 2011. No alcohol. Lives in [place]; with husband.    Social Determinants of Health   Financial Resource Strain: Low Risk  (07/20/2023)   Overall Financial Resource Strain (CARDIA)    Difficulty of Paying Living Expenses: Not hard at all  Food Insecurity: No Food Insecurity (07/20/2023)   Hunger Vital Sign    Worried About Running Out of Food in the Last Year: Never true    Ran Out of Food in the Last Year: Never true  Transportation Needs: No Transportation Needs (07/20/2023)   PRAPARE - Administrator, Civil Service (Medical): No    Lack of Transportation (Non-Medical): No  Physical Activity: Insufficiently Active (07/20/2023)   Exercise Vital Sign    Days of Exercise per Week: 2 days    Minutes of Exercise per Session: 20 min  Stress: No Stress Concern Present (07/20/2023)   [employer] of Occupational Health - Occupational Stress Questionnaire    Feeling of Stress : Not at all  Social Connections: Moderately Isolated (07/20/2023)   Social Connection and Isolation Panel [NHANES]    Frequency of Communication with Friends and Family: More than three times a week    Frequency of Social Gatherings with Friends and Family: More  than three  times a week    Attends Religious Services: Never    Active Member of Clubs or Organizations: No    Attends Banker Meetings: Never    Marital Status: Married     Family History: The patient's family history includes Acute lymphoblastic leukemia (age of onset: 3) in her cousin; Aneurysm in her maternal uncle; Arthritis in her mother; Asthma in her maternal uncle; Breast cancer in her maternal aunt; COPD in her sister; Cancer in her maternal uncle and paternal uncle; Cancer (age of onset: 23) in her father; Cancer (age of onset: 60) in her maternal aunt; Colon cancer (age of onset: 16) in her maternal uncle; Diabetes in her father; Hypertension in her mother; Stroke in her mother; Thyroid disease in her father. There is no history of Coronary artery disease, Stomach cancer, Rectal cancer, or Esophageal cancer.  ROS:   Please see the history of present illness.     All other systems reviewed and are negative.  EKGs/Labs/Other Studies Reviewed:    The following studies were reviewed today:   EKG Interpretation Date/Time:  Monday July 23 2023 11:00:11 EST Ventricular Rate:  77 PR Interval:  156 QRS Duration:  80 QT Interval:  386 QTC Calculation: 436 R Axis:   -25  Text Interpretation: Sinus rhythm with Premature supraventricular complexes Anterior infarct , age undetermined ST & T wave abnormality, consider lateral ischemia Confirmed by Debbe Odea ([id number]) on 07/23/2023 11:19:40 AM    Recent Labs: 11/08/2022: ALT 14; TSH 3.01 03/15/2023: BUN 12; Creatinine, Ser 0.83; Hemoglobin 13.6; Platelets 391; Potassium 4.1; Sodium 138  Recent Lipid Panel    Component Value Date/Time   CHOL 150 03/15/2023 1136   TRIG 315 (H) 03/15/2023 1136   HDL 44 03/15/2023 1136   CHOLHDL 3.4 03/15/2023 1136   VLDL 63 (H) 03/15/2023 1136   LDLCALC 43 03/15/2023 1136   LDLDIRECT 71 03/15/2023 1136     Risk Assessment/Calculations:      Physical Exam:    VS:  BP 100/60 (BP  Location: Left Arm, Patient Position: Sitting, Cuff Size: Large)   Pulse 77   Ht 5\' 2"  (1.575 m)   Wt 218 lb 12.8 oz (99.2 kg)   SpO2 92%   BMI 40.02 kg/m     Wt Readings from Last 3 Encounters:  07/23/23 218 lb 12.8 oz (99.2 kg)  03/21/23 220 lb 4.8 oz (99.9 kg)  03/19/23 224 lb 6 oz (101.8 kg)     GEN:  Well nourished, well developed in no acute distress HEENT: Normal NECK: No JVD; No carotid bruits CARDIAC: RRR, no murmurs, rubs, gallops RESPIRATORY:  Clear to auscultation, decreased breath sounds at bases ABDOMEN: Soft, non-tender, distended MUSCULOSKELETAL:  No edema; left-sided chest tenderness noted. SKIN: Warm and dry NEUROLOGIC:  Alert and oriented x 3 PSYCHIATRIC:  Normal affect   ASSESSMENT:    1. Coronary artery disease of native artery of native heart with stable angina pectoris (HCC)   2. Primary hypertension   3. Hyperlipidemia LDL goal <70   4. Morbid obesity (HCC)   5. Precordial pain    PLAN:    In order of problems listed above:  CAD/CABG x1 on 07/2020.  Stable angina, start Ranexa 500 mg twice daily, continue Toprol-XL 25 mg daily, Imdur 30 mg daily, aspirin, simvastatin 40 mg daily. EF 50-55%.  Obtain cardiac PET to evaluate ischemia Hypertension, BP low normal..  Continue Toprol-XL 25 mg daily, Imdur 30 mg daily. hyperlipidemia.  LDL  at goal, continue simvastatin. morbidly obese, patient is diabetic, continue Ozempic.   Follow-up in 2-3 months.  Informed Consent   Shared Decision Making/Informed Consent The risks [chest pain, shortness of breath, cardiac arrhythmias, dizziness, blood pressure fluctuations, myocardial infarction, stroke/transient ischemic attack, nausea, vomiting, allergic reaction, radiation exposure, metallic taste sensation and life-threatening complications (estimated to be 1 in 10,000)], benefits (risk stratification, diagnosing coronary artery disease, treatment guidance) and alternatives of a cardiac PET stress test were  discussed in detail with Ms. Shark and she agrees to proceed.      Medication Adjustments/Labs and Tests Ordered: Current medicines are reviewed at length with the patient today.  Concerns regarding medicines are outlined above.  Orders Placed This Encounter  Procedures   NM PET CT CARDIAC PERFUSION MULTI W/ABSOLUTE BLOODFLOW   EKG 12-Lead     Meds ordered this encounter  Medications   ranolazine (RANEXA) 500 MG 12 hr tablet    Sig: Take 1 tablet (500 mg total) by mouth 2 (two) times daily.    Dispense:  180 tablet    Refill:  3      Patient Instructions  Medication Instructions:  Your physician has recommended you make the following change in your medication:   START Ranexa 500 mg twice a day  *If you need a refill on your cardiac medications before your next appointment, please call your pharmacy*   Lab Work: None  If you have labs (blood work) drawn today and your tests are completely normal, you will receive your results only by: MyChart Message (if you have MyChart) OR A paper copy in the mail If you have any lab test that is abnormal or we need to change your treatment, we will call you to review the results.   Testing/Procedures:    Please report to Radiology at [name]tr[facility], medical mall, 30 mins prior to your test.  [address]  [place], [place]  [phone]  How to Prepare for Your Cardiac PET/CT Stress Test:   Medication instructions: Do not take erectile dysfunction medications for 72 hours prior to test (sildenafil, tadalafil) Do not take nitrates (isosorbide mononitrate, Ranexa) the day before or day of test Do not take tamsulosin the day before or morning of test Hold theophylline containing medications for 12 hours. Hold Dipyridamole 48 hours prior to the test.     Diabetic Preparation: If able to eat breakfast prior to 3 hour fasting, you may take all medications, including your insulin. Do not  worry if you miss your breakfast dose of insulin - start at your next meal. If you do not eat prior to 3 hour fast-Hold all diabetes (oral and insulin) medications. Patients who wear a continuous glucose monitor MUST remove the device prior to scanning.  You may take your remaining medications with water.  2. Nothing to eat or drink, except water, 3 hours prior to arrival time.   NO caffeine/decaffeinated products, or chocolate 12 hours prior to arrival. (Please note decaffeinated beverages (teas/coffees) still contain caffeine).  If you have caffeine within 12 hours prior, the test will need to be rescheduled.   3. NO perfume, cologne or lotion on chest or abdomen area. FEMALES - Please avoid wearing dresses to this appointment.  4. Total time is 1 to 2 hours; you may want to bring reading material for the waiting time.  IF YOU THINK YOU MAY BE PREGNANT, OR ARE NURSING PLEASE INFORM THE TECHNOLOGIST.  In preparation for your appointment,  medication and supplies will be purchased.  Appointment availability is limited, so if you need to cancel or reschedule, please call the Radiology Department at [phone] [facility]) OR [phone] [name]) 24 hours in advance to avoid a cancellation fee of $100.00  What to Expect When you Arrive:  Once you arrive and check in for your appointment, you will be taken to a preparation room within the Radiology Department.  A technologist or Nurse will obtain your medical history, verify that you are correctly prepped for the exam, and explain the procedure.  Afterwards, an IV will be started in your arm and electrodes will be placed on your skin for EKG monitoring during the stress portion of the exam. Then you will be escorted to the PET/CT scanner.  There, staff will get you positioned on the scanner and obtain a blood pressure and EKG.  During the exam, you will continue to be connected to the EKG and blood pressure machines.  A small, safe amount of a  radioactive tracer will be injected in your IV to obtain a series of pictures of your heart along with an injection of a stress agent.    After your Exam:  It is recommended that you eat a meal and drink a caffeinated beverage to counter act any effects of the stress agent.  Drink plenty of fluids for the remainder of the day and urinate frequently for the first couple of hours after the exam.  Your doctor will inform you of your test results within 7-10 business days.  For more information and frequently asked questions, please visit our website: [email or web address]  For questions about your test or how to prepare for your test, please call: Cardiac Imaging Nurse Navigators Office: [phone]    Follow-Up: At [facility], you and your health needs are our priority.  As part of our continuing mission to provide you with exceptional heart care, we have created designated Provider Care Teams.  These Care Teams include your primary Cardiologist (physician) and Advanced Practice Providers (APPs -  Physician Assistants and Nurse Practitioners) who all work together to provide you with the care you need, when you need it.   Your next appointment:   2-3 month(s)  Provider:   Debbe Odea, MD    Signed, Debbe Odea, MD  07/23/2023 12:17 PM    [facility]

## 2023-07-24 ENCOUNTER — Ambulatory Visit (INDEPENDENT_AMBULATORY_CARE_PROVIDER_SITE_OTHER): Payer: Medicare HMO | Admitting: Family Medicine

## 2023-07-24 ENCOUNTER — Encounter: Payer: Self-pay | Admitting: Family Medicine

## 2023-07-24 VITALS — BP 128/66 | HR 81 | Temp 97.5°F | Ht 62.0 in | Wt 216.2 lb

## 2023-07-24 DIAGNOSIS — Z951 Presence of aortocoronary bypass graft: Secondary | ICD-10-CM

## 2023-07-24 DIAGNOSIS — Z7985 Long-term (current) use of injectable non-insulin antidiabetic drugs: Secondary | ICD-10-CM

## 2023-07-24 DIAGNOSIS — R7989 Other specified abnormal findings of blood chemistry: Secondary | ICD-10-CM | POA: Diagnosis not present

## 2023-07-24 DIAGNOSIS — I25118 Atherosclerotic heart disease of native coronary artery with other forms of angina pectoris: Secondary | ICD-10-CM | POA: Diagnosis not present

## 2023-07-24 DIAGNOSIS — Z7984 Long term (current) use of oral hypoglycemic drugs: Secondary | ICD-10-CM | POA: Diagnosis not present

## 2023-07-24 DIAGNOSIS — E1169 Type 2 diabetes mellitus with other specified complication: Secondary | ICD-10-CM

## 2023-07-24 DIAGNOSIS — M150 Primary generalized (osteo)arthritis: Secondary | ICD-10-CM

## 2023-07-24 LAB — POCT GLYCOSYLATED HEMOGLOBIN (HGB A1C): Hemoglobin A1C: 6 % — AB (ref 4.0–5.6)

## 2023-07-24 MED ORDER — TURMERIC 500 MG PO CAPS: 1.0000 | ORAL_CAPSULE | Freq: Two times a day (BID) | ORAL | Status: AC | PRN

## 2023-07-24 MED ORDER — VITAMIN B-12 500 MCG PO TABS: 500.0000 ug | ORAL_TABLET | ORAL | Status: DC

## 2023-07-24 MED ORDER — TRAMADOL HCL 50 MG PO TABS: ORAL_TABLET | ORAL | 1 refills | Status: DC

## 2023-07-24 NOTE — Progress Notes (Signed)
Ph: [phone] Fax: [phone]   Patient ID: [name], female    DOB: 1950-10-06, 72 y.o.   MRN: [id number]  This visit was conducted in person.  BP 128/66   Pulse 81   Temp (!) 97.5 F (36.4 C) (Oral)   Ht 5\' 2"  (1.575 m)   Wt 216 lb 4 oz (98.1 kg)   SpO2 98%   BMI 39.55 kg/m    CC: DM f/u visit  Subjective:   HPI: [name] is a 72 y.o. female presenting on 07/24/2023 for Diabetes   Notes ongoing exertional chest pain and dyspnea.   Saw cardiology yesterday planned PET/CT stress test 08/30/2023.  H/o CAD s/p 1v CABG 2021 with stable angina. Ranexa 500mg  bid started.   Saw pulmonology 02/2023 for mild COPD now on Breztri with PRN albuterol. Uses nocturnal O2 at 2L/min via [place].   She requests Rx meloxicam for R knee R hip and L hip. She has seen orthopedics for this previously.   DM - does regularly check sugars fasting 100s-120s, postprandial after dinner 100-130s. Compliant with antihyperglycemic regimen which includes: metformin 500mg  daily and ozempic 2mg  weekly. Tolerating well without epigastric pain, nausea, constipation. Denies low sugars or hypoglycemic symptoms. Denies paresthesias, blurry vision. Last diabetic eye exam 01/2023. Glucometer brand: one touch Ultra and Verio. Last foot exam: 02/2023. DSME: completed 2012. Lab Results  Component Value Date   HGBA1C 6.0 (A) 07/24/2023   Diabetic Foot Exam - Simple   No data filed    Lab Results  Component Value Date   MICROALBUR <0.7 11/08/2022         Relevant past medical, surgical, family and social history reviewed and updated as indicated. Interim medical history since our last visit reviewed. Allergies and medications reviewed and updated. Outpatient Medications Prior to Visit  Medication Sig Dispense Refill   acetaminophen (TYLENOL 8 HOUR ARTHRITIS PAIN) 650 MG CR tablet Take 650 mg by mouth every 8 (eight) hours as needed for pain.     albuterol (VENTOLIN HFA) 108 (90 Base) MCG/ACT  inhaler Inhale 2 puffs into the lungs every 6 (six) hours as needed for wheezing or shortness of breath. 18 g 3   aspirin 81 MG EC tablet Take 1 tablet (81 mg total) by mouth daily.     aspirin-acetaminophen-caffeine (EXCEDRIN MIGRAINE) 250-250-65 MG tablet Take by mouth every 6 (six) hours as needed for headache.     Blood Glucose Monitoring Suppl (ONETOUCH VERIO) w/Device KIT Use as instructed to check blood sugar 2 times a day 1 kit 0   Boswellia-Glucosamine-Vit D (OSTEO BI-FLEX ONE PER DAY PO) Take 1 tablet by mouth daily.     Budeson-Glycopyrrol-Formoterol (BREZTRI AEROSPHERE) 160-9-4.8 MCG/ACT AERO Inhale 2 puffs into the lungs in the morning and at bedtime. 5.9 g 0   Calcium Carb-Cholecalciferol (CALCIUM + VITAMIN D3 PO) Take by mouth daily.     cyclobenzaprine (FLEXERIL) 10 MG tablet TAKE 1/2 TO 1 TABLET BY MOUTH TWICE A DAY AS NEEDED FOR MUSCLE SPASMS AND MIGRAINE 30 tablet 3   famotidine (PEPCID) 20 MG tablet Take 20 mg by mouth every other day. Alternates with omeprazole     gabapentin (NEURONTIN) 100 MG capsule TAKE 1 TO 3 CAPSULES AT    BEDTIME 200 capsule 1   glucose blood (ONETOUCH ULTRA TEST) test strip Use as instructed to check sugars twice daily E11.69 100 each 12   glucose blood (ONETOUCH VERIO) test strip Use as instructed to check blood sugar  2 times a day 200 each 4   IBUPROFEN PO Take by mouth as needed.     Iron, Ferrous Sulfate, 325 (65 Fe) MG TABS Take 325 mg by mouth once a week.     Lancets (ONETOUCH DELICA PLUS LANCET33G) MISC Use as instructed to check blood sugar 2 times a day 200 each 3   levothyroxine (SYNTHROID) 100 MCG tablet TAKE 1 TABLET DAILY, AND ON2 DAYS A WEEK, TAKE AN     EXTRA 1/2 TABLET 90 tablet 20   loratadine (CLARITIN) 10 MG tablet Take 1 tablet (10 mg total) by mouth daily.     metFORMIN (GLUCOPHAGE) 500 MG tablet Take 1 tablet (500 mg total) by mouth daily with breakfast. 90 tablet 3   metoprolol succinate (TOPROL-XL) 25 MG 24 hr tablet Take 1  tablet (25 mg total) by mouth daily. 90 tablet 4   mometasone (ELOCON) 0.1 % cream Apply 1 Application topically daily as needed (Rash). 45 g 1   Multiple Vitamin (MULTIVITAMIN WITH MINERALS) TABS tablet Take 1 tablet by mouth daily. One A Day for Women     mupirocin ointment (BACTROBAN) 2 % Apply 1 Application topically 2 (two) times daily. 15 g 3   omeprazole (PRILOSEC) 20 MG capsule TAKE 1 CAPSULE EVERY OTHER DAY. ALTERNATE WITH        FAMOTIDINE 20MG  45 capsule 3   OXYGEN Inhale 2 L into the lungs at bedtime.     Rhubarb (ESTROVEN COMPLETE PO) Take 1 tablet by mouth daily.     Semaglutide, 2 MG/DOSE, 8 MG/3ML SOPN Inject 2 mg as directed once a week. 3 mL 0   simvastatin (ZOCOR) 40 MG tablet Take 1 tablet (40 mg total) by mouth at bedtime. 90 tablet 4   torsemide (DEMADEX) 20 MG tablet Take 1.5 tablets (30 mg total) by mouth daily. 135 tablet 3   ziprasidone (GEODON) 40 MG capsule Take 1 capsule (40 mg total) by mouth 2 (two) times daily with a meal. 180 capsule 4   traMADol (ULTRAM) 50 MG tablet TAKE 1 TABLET (50 MG TOTAL) BY MOUTH TWICE A DAY AS NEEDED FOR MODERATE PAIN 30 tablet 1   isosorbide mononitrate (IMDUR) 60 MG 24 hr tablet TAKE 0.5 TABLETS (30 MG TOTAL) BY MOUTH 2 (TWO) TIMES DAILY. 90 tablet 3   ranolazine (RANEXA) 500 MG 12 hr tablet Take 1 tablet (500 mg total) by mouth 2 (two) times daily. (Patient not taking: Reported on 07/24/2023) 180 tablet 3   Facility-Administered Medications Prior to Visit  Medication Dose Route Frequency Provider Last Rate Last Admin   albuterol (PROVENTIL) (2.5 MG/3ML) 0.083% nebulizer solution 2.5 mg  2.5 mg Nebulization Once Salena Saner, MD         Per HPI unless specifically indicated in ROS section below Review of Systems  Objective:  BP 128/66   Pulse 81   Temp (!) 97.5 F (36.4 C) (Oral)   Ht 5\' 2"  (1.575 m)   Wt 216 lb 4 oz (98.1 kg)   SpO2 98%   BMI 39.55 kg/m   Wt Readings from Last 3 Encounters:  07/24/23 216 lb 4 oz  (98.1 kg)  07/23/23 218 lb 12.8 oz (99.2 kg)  03/21/23 220 lb 4.8 oz (99.9 kg)      Physical Exam Vitals and nursing note reviewed.  Constitutional:      Appearance: Normal appearance. She is not ill-appearing.  HENT:     Head: Normocephalic and atraumatic.     Mouth/Throat:  Mouth: Mucous membranes are moist.     Pharynx: Oropharynx is clear. No oropharyngeal exudate or posterior oropharyngeal erythema.  Eyes:     Extraocular Movements: Extraocular movements intact.     Conjunctiva/sclera: Conjunctivae normal.     Pupils: Pupils are equal, round, and reactive to light.  Cardiovascular:     Rate and Rhythm: Normal rate and regular rhythm.     Pulses: Normal pulses.     Heart sounds: Normal heart sounds. No murmur heard. Pulmonary:     Effort: Pulmonary effort is normal. No respiratory distress.     Breath sounds: Normal breath sounds. No wheezing, rhonchi or rales.  Musculoskeletal:     Right lower leg: No edema.     Left lower leg: No edema.  Skin:    General: Skin is warm and dry.     Findings: No rash.  Neurological:     Mental Status: She is alert.  Psychiatric:        Mood and Affect: Mood normal.        Behavior: Behavior normal.       Results for orders placed or performed in visit on 07/24/23  POCT glycosylated hemoglobin (Hb A1C)  Result Value Ref Range   Hemoglobin A1C 6.0 (A) 4.0 - 5.6 %   HbA1c POC (<> result, manual entry)     HbA1c, POC (prediabetic range)     HbA1c, POC (controlled diabetic range)      Assessment & Plan:   Problem List Items Addressed This Visit     Type 2 diabetes mellitus with other specified complication (HCC) - Primary    Chronic, great control on current regimen - continue this.       Relevant Orders   POCT glycosylated hemoglobin (Hb A1C) (Completed)   CAD (coronary artery disease)    Recently started on Ranexa for ongoing stable angina despite Imdur.  Pending stress test next month.  Appreciate cards care.       Severe obesity (BMI 35.0-39.9) with comorbidity (HCC)    Congratulated on ongoing slow weight loss on ozempic.       Osteoarthritis    Rec avoid NSAIDs in cardiac history.  Trial turmeric 500mg  BID PRN Rx tramadol for breakthrough pain       Relevant Medications   traMADol (ULTRAM) 50 MG tablet   S/P CABG x 1   Elevated vitamin B12 level    She had continued daily B12 - rec drop to once weekly. Will reassess control at f/u visitt         Meds ordered this encounter  Medications   traMADol (ULTRAM) 50 MG tablet    Sig: TAKE 1 TABLET (50 MG TOTAL) BY MOUTH TWICE A DAY AS NEEDED FOR MODERATE PAIN    Dispense:  30 tablet    Refill:  1    For chronic pain from osteoarthritis   Turmeric 500 MG CAPS    Sig: Take 1 capsule by mouth 2 (two) times daily as needed.   cyanocobalamin (CYANOCOBALAMIN) 500 MCG tablet    Sig: Take 1 tablet (500 mcg total) by mouth once a week.    Orders Placed This Encounter  Procedures   POCT glycosylated hemoglobin (Hb A1C)    Patient Instructions  Sugar control is doing very well! Continue metformin and ozempic.  For pain, avoid anti inflammatories for now. May try over the counter turmeric 500mg  twice daily as needed. I've also refilled tramadol for breakthrough pain  Drop B12 vitamin to  once weekly dosing.  Return in 4 months for physical.   Follow up plan: Return in about 4 months (around 11/22/2023) for annual exam, prior fasting for blood work, medicare wellness visit.  Eustaquio Boyden, MD

## 2023-07-24 NOTE — Assessment & Plan Note (Signed)
Rec avoid NSAIDs in cardiac history.  Trial turmeric 500mg  BID PRN Rx tramadol for breakthrough pain

## 2023-07-24 NOTE — Assessment & Plan Note (Signed)
Chronic, great control on current regimen - continue this.  ?

## 2023-07-24 NOTE — Assessment & Plan Note (Signed)
She had continued daily B12 - rec drop to once weekly. Will reassess control at f/u visitt

## 2023-07-24 NOTE — Assessment & Plan Note (Signed)
Recently started on Ranexa for ongoing stable angina despite Imdur.  Pending stress test next month.  Appreciate cards care.

## 2023-07-24 NOTE — Patient Instructions (Addendum)
Sugar control is doing very well! Continue metformin and ozempic.  For pain, avoid anti inflammatories for now. May try over the counter turmeric 500mg  twice daily as needed. I've also refilled tramadol for breakthrough pain  Drop B12 vitamin to once weekly dosing.  Return in 4 months for physical.

## 2023-07-24 NOTE — Assessment & Plan Note (Signed)
Congratulated on ongoing slow weight loss on ozempic.

## 2023-08-03 ENCOUNTER — Other Ambulatory Visit: Payer: Self-pay | Admitting: Family Medicine

## 2023-08-03 DIAGNOSIS — E1169 Type 2 diabetes mellitus with other specified complication: Secondary | ICD-10-CM

## 2023-08-03 NOTE — Telephone Encounter (Signed)
Ozempic Last filled:  07/09/23, #3 mL Last OV:  07/24/23, 4 mo DM f/u Next OV:  11/23/23, CPE

## 2023-08-06 ENCOUNTER — Other Ambulatory Visit: Payer: Self-pay | Admitting: Family Medicine

## 2023-08-06 DIAGNOSIS — E1169 Type 2 diabetes mellitus with other specified complication: Secondary | ICD-10-CM

## 2023-08-07 DIAGNOSIS — J449 Chronic obstructive pulmonary disease, unspecified: Secondary | ICD-10-CM | POA: Diagnosis not present

## 2023-08-07 NOTE — Telephone Encounter (Signed)
Previously ERx

## 2023-08-07 NOTE — Telephone Encounter (Signed)
ERx 

## 2023-08-10 ENCOUNTER — Telehealth: Payer: Self-pay | Admitting: *Deleted

## 2023-08-10 NOTE — Telephone Encounter (Signed)
Copied from CRM [id number]. Topic: Clinical - Prescription Issue >> Aug 10, 2023 12:20 PM [place] A wrote: Reason for CRM: Patient called because she said that she has not received a call back regarding her Ozempic.

## 2023-08-13 ENCOUNTER — Encounter: Payer: Self-pay | Admitting: Pharmacist

## 2023-08-13 NOTE — Progress Notes (Signed)
Manufacturer Assistance Program (MAP) Application   Manufacturer: [employer]    (Re-enrollment) Medication(s): Ozmepic 2 mg  Patient Portion of Application:  12/23: Completed with patient via online enrollment tool. Uploaded to media tab Income Documentation: N/A - Electronic verification elected.  Provider Portion of Application:  12/23: Provider portion completed by PharmD and faxed to clinic for review and signature. Prescription(s): Included in MAP application.   Application Status: Not submitted (pending signatures)  Next Steps: []    PCP signature []    Upon signature(s) Application to be faxed to [employer] Fax: [phone] AND scanned into patient chart []    Fax confirmation of approval received  Forwarded to [facility] CPhT Patient Advocate Team for future correspondences/re-enrollment.  Note routed to PCP Clinic Pool to ensure PCP signature is obtained and application is faxed.  *[facility] clinic team - Please Addend/update this note as the "Next Steps" are completed in office*

## 2023-08-14 NOTE — Progress Notes (Signed)
[facility] PAP application printed and placed in Dr. Timoteo Expose inbox for signature.

## 2023-08-16 ENCOUNTER — Ambulatory Visit
Admission: RE | Admit: 2023-08-16 | Discharge: 2023-08-16 | Disposition: A | Discharge status: Home / Self Care | Payer: Medicare HMO | Source: Ambulatory Visit | Attending: Family Medicine | Admitting: Family Medicine

## 2023-08-16 ENCOUNTER — Other Ambulatory Visit: Payer: Self-pay | Admitting: Family Medicine

## 2023-08-16 DIAGNOSIS — I10 Essential (primary) hypertension: Secondary | ICD-10-CM

## 2023-08-16 DIAGNOSIS — Z1231 Encounter for screening mammogram for malignant neoplasm of breast: Secondary | ICD-10-CM | POA: Diagnosis not present

## 2023-08-17 NOTE — Progress Notes (Signed)
Signed and in Lisa's box.

## 2023-08-20 ENCOUNTER — Other Ambulatory Visit (INDEPENDENT_AMBULATORY_CARE_PROVIDER_SITE_OTHER): Payer: Medicare HMO | Admitting: Pharmacist

## 2023-08-20 DIAGNOSIS — E1169 Type 2 diabetes mellitus with other specified complication: Secondary | ICD-10-CM

## 2023-08-20 NOTE — Progress Notes (Signed)
Next Steps: [x]    PCP signature [x]    Upon signature(s) Application to be faxed to [employer] Fax: [phone] AND scanned into patient chart [x]    Fax confirmation of approval received

## 2023-08-20 NOTE — Progress Notes (Signed)
Returned patient call regarding Ozempic question.   Reports she has not received Ozempic refill from [employer]. She purchased a refill at the pharmacy and has 2 injections remaining (due to be given on 1/1 and 1/8).   I Spoke with [employer] today who reports that patient reached refill limit for 2024 (only allow 12 months of medication total, not accounting for any titration/doubled doses) which was discussed with patient previously. However, assured her that:   Patient has been approved for [employer] (Ozempic 2 mg, four pens per shipment) through 08/20/2024. [facility] assures that order will start processing 08/22/23 and should ship/be delivered within 10-14 business days.  Advised patient to keep an eye of tracking information/shipment date. Medication should arrive just in time for her next dose. Advised her it is okay to give it 3-4 days late.  If shipment projected longer than this, she confirms she will pick up a refill at her pharmacy. Is to give her following doses q7 days thereafter.   Loree Fee, PharmD Clinical Pharmacist [facility] [phone]

## 2023-08-20 NOTE — Telephone Encounter (Addendum)
Pt's form was faxed to [employer] (see 08/13/23 documentation note).   Spoke with pt relaying info above and letting her know if approved, we will call her once shipment is received. Pt verbalizes understanding and expresses her thanks.

## 2023-08-20 NOTE — Telephone Encounter (Signed)
Patient came by and dropped off some ppw that was sent to her from [facility]. Placed in Dr. Timoteo Expose box up front.

## 2023-08-30 ENCOUNTER — Ambulatory Visit: Admission: RE | Admit: 2023-08-30 | Payer: Medicare HMO | Source: Ambulatory Visit

## 2023-09-07 DIAGNOSIS — J449 Chronic obstructive pulmonary disease, unspecified: Secondary | ICD-10-CM | POA: Diagnosis not present

## 2023-09-08 ENCOUNTER — Other Ambulatory Visit: Payer: Self-pay | Admitting: Family Medicine

## 2023-09-18 ENCOUNTER — Encounter: Payer: Self-pay | Admitting: Family Medicine

## 2023-09-20 NOTE — Telephone Encounter (Signed)
Patient came by and picked up medication.

## 2023-09-24 ENCOUNTER — Ambulatory Visit: Payer: Medicare HMO | Admitting: Cardiology

## 2023-09-26 ENCOUNTER — Encounter (HOSPITAL_COMMUNITY): Payer: Self-pay

## 2023-09-27 ENCOUNTER — Ambulatory Visit
Admission: RE | Admit: 2023-09-27 | Discharge: 2023-09-27 | Disposition: A | Discharge status: Home / Self Care | Payer: Medicare HMO | Source: Ambulatory Visit | Attending: Cardiology | Admitting: Cardiology

## 2023-09-27 DIAGNOSIS — I2510 Atherosclerotic heart disease of native coronary artery without angina pectoris: Secondary | ICD-10-CM | POA: Diagnosis not present

## 2023-09-27 DIAGNOSIS — I517 Cardiomegaly: Secondary | ICD-10-CM | POA: Diagnosis not present

## 2023-09-27 DIAGNOSIS — I700 Atherosclerosis of aorta: Secondary | ICD-10-CM | POA: Insufficient documentation

## 2023-09-27 DIAGNOSIS — R072 Precordial pain: Secondary | ICD-10-CM

## 2023-09-27 DIAGNOSIS — Z951 Presence of aortocoronary bypass graft: Secondary | ICD-10-CM | POA: Insufficient documentation

## 2023-09-27 DIAGNOSIS — I25118 Atherosclerotic heart disease of native coronary artery with other forms of angina pectoris: Secondary | ICD-10-CM

## 2023-09-27 MED ORDER — REGADENOSON 0.4 MG/5ML IV SOLN
0.4000 mg | Freq: Once | INTRAVENOUS | Status: AC
  Administered 2023-09-27: 0.4 mg via INTRAVENOUS
  Filled 2023-09-27: qty 5

## 2023-09-27 MED ORDER — REGADENOSON 0.4 MG/5ML IV SOLN
INTRAVENOUS | Status: AC
  Filled 2023-09-27: qty 5

## 2023-09-27 MED ORDER — RUBIDIUM RB82 GENERATOR (RUBYFILL)
25.0000 | PACK | Freq: Once | INTRAVENOUS | Status: AC
  Administered 2023-09-27: 24.37 via INTRAVENOUS

## 2023-09-27 MED ORDER — RUBIDIUM RB82 GENERATOR (RUBYFILL)
25.0000 | PACK | Freq: Once | INTRAVENOUS | Status: AC
  Administered 2023-09-27: 25 via INTRAVENOUS

## 2023-09-27 NOTE — Progress Notes (Signed)
 Patient presents for a cardiac PET stress test and tolerated procedure with some nausea and dizziness. Patient maintained acceptable vital signs throughout the test and was offered caffeine  after test. Patient monitored until the dizziness resolved. Patient escorted out of the department in a wheelchair.

## 2023-09-28 ENCOUNTER — Other Ambulatory Visit: Payer: Self-pay | Admitting: Family Medicine

## 2023-09-28 DIAGNOSIS — E1169 Type 2 diabetes mellitus with other specified complication: Secondary | ICD-10-CM

## 2023-09-28 LAB — NM PET CT CARDIAC PERFUSION MULTI W/ABSOLUTE BLOODFLOW
LV dias vol: 98 mL (ref 46–106)
LV sys vol: 50 mL
MBFR: 2.25
Nuc Rest EF: 50 %
Nuc Stress EF: 49 %
Peak HR: 107 {beats}/min
Rest HR: 73 {beats}/min
Rest MBF: 0.83 ml/g/min
Rest Nuclear Isotope Dose: 24.5 mCi
SRS: 2
SSS: 3
Stress MBF: 1.87 ml/g/min
Stress Nuclear Isotope Dose: 24.4 mCi
TID: 0.8

## 2023-10-02 ENCOUNTER — Other Ambulatory Visit: Payer: Self-pay | Admitting: Family Medicine

## 2023-10-02 DIAGNOSIS — M797 Fibromyalgia: Secondary | ICD-10-CM

## 2023-10-02 NOTE — Telephone Encounter (Signed)
Gabapentin Last filled:  08/14/23, #200 Last OV:  07/24/23, DM f/u Next OV:  11/23/23, CPE

## 2023-10-08 DIAGNOSIS — J449 Chronic obstructive pulmonary disease, unspecified: Secondary | ICD-10-CM | POA: Diagnosis not present

## 2023-10-10 ENCOUNTER — Ambulatory Visit: Payer: Medicare HMO | Admitting: Cardiology

## 2023-10-19 DIAGNOSIS — M1711 Unilateral primary osteoarthritis, right knee: Secondary | ICD-10-CM | POA: Diagnosis not present

## 2023-10-26 ENCOUNTER — Other Ambulatory Visit: Payer: Self-pay

## 2023-10-26 MED ORDER — BREZTRI AEROSPHERE 160-9-4.8 MCG/ACT IN AERO: 2.0000 | INHALATION_SPRAY | Freq: Two times a day (BID) | RESPIRATORY_TRACT | 3 refills | Status: DC

## 2023-10-26 NOTE — Progress Notes (Signed)
 Per fax from [facility] patient needs a refill on Breaztri.  I have sent in the refill.   Nothing further needed.

## 2023-11-05 DIAGNOSIS — J449 Chronic obstructive pulmonary disease, unspecified: Secondary | ICD-10-CM | POA: Diagnosis not present

## 2023-11-07 ENCOUNTER — Other Ambulatory Visit: Payer: Self-pay | Admitting: Family Medicine

## 2023-11-07 DIAGNOSIS — F3178 Bipolar disorder, in full remission, most recent episode mixed: Secondary | ICD-10-CM

## 2023-11-07 NOTE — Telephone Encounter (Signed)
 Geodon Last filled:  08/22/23, #180 Last OV:  07/24/23, DM f/u Next OV:  11/23/23, CPE

## 2023-11-10 ENCOUNTER — Other Ambulatory Visit: Payer: Self-pay | Admitting: Family Medicine

## 2023-11-10 DIAGNOSIS — E611 Iron deficiency: Secondary | ICD-10-CM

## 2023-11-10 DIAGNOSIS — E1169 Type 2 diabetes mellitus with other specified complication: Secondary | ICD-10-CM

## 2023-11-10 DIAGNOSIS — E039 Hypothyroidism, unspecified: Secondary | ICD-10-CM

## 2023-11-10 DIAGNOSIS — R7989 Other specified abnormal findings of blood chemistry: Secondary | ICD-10-CM

## 2023-11-10 DIAGNOSIS — D75839 Thrombocytosis, unspecified: Secondary | ICD-10-CM

## 2023-11-14 ENCOUNTER — Other Ambulatory Visit: Payer: Self-pay | Admitting: Family Medicine

## 2023-11-14 DIAGNOSIS — I10 Essential (primary) hypertension: Secondary | ICD-10-CM

## 2023-11-16 ENCOUNTER — Other Ambulatory Visit (INDEPENDENT_AMBULATORY_CARE_PROVIDER_SITE_OTHER): Payer: Medicare HMO

## 2023-11-16 DIAGNOSIS — E785 Hyperlipidemia, unspecified: Secondary | ICD-10-CM | POA: Diagnosis not present

## 2023-11-16 DIAGNOSIS — E611 Iron deficiency: Secondary | ICD-10-CM

## 2023-11-16 DIAGNOSIS — E1169 Type 2 diabetes mellitus with other specified complication: Secondary | ICD-10-CM | POA: Diagnosis not present

## 2023-11-16 DIAGNOSIS — R7989 Other specified abnormal findings of blood chemistry: Secondary | ICD-10-CM | POA: Diagnosis not present

## 2023-11-16 DIAGNOSIS — E039 Hypothyroidism, unspecified: Secondary | ICD-10-CM

## 2023-11-16 LAB — COMPREHENSIVE METABOLIC PANEL WITH GFR
ALT: 13 U/L (ref 0–35)
AST: 13 U/L (ref 0–37)
Albumin: 4.4 g/dL (ref 3.5–5.2)
Alkaline Phosphatase: 49 U/L (ref 39–117)
BUN: 10 mg/dL (ref 6–23)
CO2: 28 meq/L (ref 19–32)
Calcium: 9.9 mg/dL (ref 8.4–10.5)
Chloride: 96 meq/L (ref 96–112)
Creatinine, Ser: 0.87 mg/dL (ref 0.40–1.20)
GFR: 67.15 mL/min (ref >60.00)
Glucose, Bld: 100 mg/dL — ABNORMAL HIGH (ref 70–99)
Potassium: 4.4 meq/L (ref 3.5–5.1)
Sodium: 135 meq/L (ref 135–145)
Total Bilirubin: 0.4 mg/dL (ref 0.2–1.2)
Total Protein: 7.2 g/dL (ref 6.0–8.3)

## 2023-11-16 LAB — LIPID PANEL
Cholesterol: 145 mg/dL (ref 0–200)
HDL: 43 mg/dL (ref >39.00)
LDL Cholesterol: 54 mg/dL (ref 0–99)
NonHDL: 101.7
Total CHOL/HDL Ratio: 3
Triglycerides: 238 mg/dL — ABNORMAL HIGH (ref 0.0–149.0)
VLDL: 47.6 mg/dL — ABNORMAL HIGH (ref 0.0–40.0)

## 2023-11-16 LAB — IBC PANEL
Iron: 102 ug/dL (ref 42–145)
Saturation Ratios: 28.6 % (ref 20.0–50.0)
TIBC: 357 ug/dL (ref 250.0–450.0)
Transferrin: 255 mg/dL (ref 212.0–360.0)

## 2023-11-16 LAB — CBC WITH DIFFERENTIAL/PLATELET
Basophils Absolute: 0.1 10*3/uL (ref 0.0–0.1)
Basophils Relative: 0.9 % (ref 0.0–3.0)
Eosinophils Absolute: 0 10*3/uL (ref 0.0–0.7)
Eosinophils Relative: 0.1 % (ref 0.0–5.0)
HCT: 39.7 % (ref 36.0–46.0)
Hemoglobin: 13.6 g/dL (ref 12.0–15.0)
Lymphocytes Relative: 20.4 % (ref 12.0–46.0)
Lymphs Abs: 1.9 10*3/uL (ref 0.7–4.0)
MCHC: 34.2 g/dL (ref 30.0–36.0)
MCV: 96.3 fl (ref 78.0–100.0)
Monocytes Absolute: 0.6 10*3/uL (ref 0.1–1.0)
Monocytes Relative: 6.5 % (ref 3.0–12.0)
Neutro Abs: 6.8 10*3/uL (ref 1.4–7.7)
Neutrophils Relative %: 72.1 % (ref 43.0–77.0)
Platelets: 411 10*3/uL — ABNORMAL HIGH (ref 150.0–400.0)
RBC: 4.13 Mil/uL (ref 3.87–5.11)
RDW: 12.3 % (ref 11.5–15.5)
WBC: 9.4 10*3/uL (ref 4.0–10.5)

## 2023-11-16 LAB — MICROALBUMIN / CREATININE URINE RATIO
Creatinine,U: 22.8 mg/dL
Microalb Creat Ratio: UNDETERMINED mg/g (ref 0.0–30.0)
Microalb, Ur: <0.7 mg/dL

## 2023-11-16 LAB — FERRITIN: Ferritin: 88 ng/mL (ref 10.0–291.0)

## 2023-11-16 LAB — TSH: TSH: 0.97 u[IU]/mL (ref 0.35–5.50)

## 2023-11-16 LAB — VITAMIN B12: Vitamin B-12: 508 pg/mL (ref 211–911)

## 2023-11-17 LAB — HEMOGLOBIN A1C: Hgb A1c MFr Bld: 6 % (ref 4.6–6.5)

## 2023-11-21 ENCOUNTER — Ambulatory Visit: Payer: Medicare HMO | Attending: Cardiology | Admitting: Cardiology

## 2023-11-21 ENCOUNTER — Encounter: Payer: Self-pay | Admitting: Cardiology

## 2023-11-21 VITALS — BP 104/66 | HR 86 | Ht 62.0 in | Wt 210.4 lb

## 2023-11-21 DIAGNOSIS — E785 Hyperlipidemia, unspecified: Secondary | ICD-10-CM

## 2023-11-21 DIAGNOSIS — I25118 Atherosclerotic heart disease of native coronary artery with other forms of angina pectoris: Secondary | ICD-10-CM

## 2023-11-21 DIAGNOSIS — I10 Essential (primary) hypertension: Secondary | ICD-10-CM

## 2023-11-21 DIAGNOSIS — Z6838 Body mass index (BMI) 38.0-38.9, adult: Secondary | ICD-10-CM

## 2023-11-21 NOTE — Progress Notes (Signed)
 Cardiology Office Note:    Date:  11/21/2023   ID:  [name], DOB Dec 14, 1950, MRN [id number]  PCP:  Eustaquio Boyden, MD  [facility] Cardiologist:  Debbe Odea, MD  [facility] Electrophysiologist:  None   Referring MD: Eustaquio Boyden, MD   No chief complaint on file.   History of Present Illness:    [name] is a 73 y.o. female with a hx of CAD/CABG x 1 (LIMA-LAD, 07/2020), hypertension, hyperlipidemia, diabetes, former smoker x40 years, COPD, OSA, morbid obesity who presents for follow-up.  Patient has stable angina, cardiac PET was obtained to evaluate any significant ischemia.  Ranexa was also started with good effect.  States feeling okay, denies any new concerns.  Presents for cardiac stress testing results.  Stays busy due to taking care of her husband who is wheelchair-bound.  Prior notes Echo 03/2022 EF 50 to 55% Lexiscan Myoview 03/2022 limited by artifact. Intraoperative TEE, EF approximately 50%. Right and left heart cath 06/2020 single-vessel CAD sequential 70% ostial, 90% mid LAD. CAD/CABG x1 LIMA to LAD 07/2020 Lexiscan Myoview 11/09/2020 with no significant ischemia, fixed apical defect.   Past Medical History:  Diagnosis Date   Anxiety    Arthritis    per patient "all over body"   Bipolar disorder [facility])    has stopped seeing psychiatrist   CAD (coronary artery disease)    a. 06/2020 MV: small, sev, apical inf/apical defect-scar vs ischemia. EF 45%; b. 06/2020 Cath: LM 77m, LAD 70ost, 35m, LCX nl, RCA nl; c. 07/2020 CABG x 1: LIMA->LAD; d. 03/2022 MV: Significantly degraded imaging due to breast attenuation/obesity/extracardiac activity. No obvious isch/scar. EF 30-35% (EF 50-55% w/o rwma on f/u echo).   Cataract    Chronic bronchitis    COPD (chronic obstructive pulmonary disease) (HCC)    Depression    Dyspnea 2012   s/p pulm/cards w/u WNL, thought due to obesity/deconditioning   Fibromyalgia    GERD (gastroesophageal reflux  disease)    HFmrEF (heart failure with mid-range ejection fraction) (HCC)    a. 05/2020 Echo: EF 40%, glob HK, mild LVH, Gr1 DD; b. 06/2020 EF by SPECT: 45%; c. 07/2020 intraop TEE: EF 50%; d. 03/2022 Echo: EF 50-55%, no rwma, GrI DD, mild LVH, nl RV fxn, mild MR.   History of chicken pox    HLD (hyperlipidemia)    Hypertension    Hypothyroidism    Ischemic cardiomyopathy    a. 05/2020 Echo: EF 40%; b. 06/2020 EF by SPECT: 45%; c. 07/2020 intraop TEE: EF 50%.   Migraine    OSA (obstructive sleep apnea) 03/16/2011   Pneumonia    Sleep apnea    per patient cannot tolerate CPAP   T2DM (type 2 diabetes mellitus) (HCC) 2012   DM education 06/2011   Urinary incontinence     Past Surgical History:  Procedure Laterality Date   BREAST CYST ASPIRATION Right 2003   cardiac catherization  03/2011   x3 Dicie Beam), mild nonobstructive CAD   COLONOSCOPY  12/2011   hyperplastic polyp x2,. diverticulosis, rec rpt 10 yrs   CORONARY ARTERY BYPASS GRAFT N/A 08/03/2020   Procedure: CORONARY ARTERY BYPASS GRAFTING TIMES ONE USING LEFT INTERNAL MAMMARY ARTERY.;  Surgeon: Alleen Borne, MD;  Location: [facility] OR;  Service: Open Heart Surgery;  Laterality: N/A;   ESOPHAGOGASTRODUODENOSCOPY  2006   ESOPHAGOGASTRODUODENOSCOPY  05/2020   no esophageal abnormality to explain dysphagia - esophagus dilated. Erosive gastropathy - neg H pylori Russella Dar)   KNEE SURGERY  2006   left, torn meniscus   RIGHT/LEFT HEART CATH AND CORONARY ANGIOGRAPHY N/A 07/13/2020   Procedure: RIGHT/LEFT HEART CATH AND CORONARY ANGIOGRAPHY;  Surgeon: Yvonne Kendall, MD;  Location: [facility] INVASIVE CV LAB;  Service: Cardiovascular;  Laterality: N/A;   TEE WITHOUT CARDIOVERSION N/A 08/03/2020   Procedure: TRANSESOPHAGEAL ECHOCARDIOGRAM (TEE);  Surgeon: Alleen Borne, MD;  Location: [facility] OR;  Service: Open Heart Surgery;  Laterality: N/A;   TOTAL ABDOMINAL HYSTERECTOMY  2004   fibroids, cervix remained   TOTAL KNEE ARTHROPLASTY Left 08/2012    Katrinka Blazing, [facility]    Current Medications: Current Meds  Medication Sig   acetaminophen (TYLENOL 8 HOUR ARTHRITIS PAIN) 650 MG CR tablet Take 650 mg by mouth every 8 (eight) hours as needed for pain.   albuterol (VENTOLIN HFA) 108 (90 Base) MCG/ACT inhaler Inhale 2 puffs into the lungs every 6 (six) hours as needed for wheezing or shortness of breath.   aspirin 81 MG EC tablet Take 1 tablet (81 mg total) by mouth daily.   aspirin-acetaminophen-caffeine (EXCEDRIN MIGRAINE) 250-250-65 MG tablet Take by mouth every 6 (six) hours as needed for headache.   Blood Glucose Monitoring Suppl (ONETOUCH VERIO) w/Device KIT Use as instructed to check blood sugar 2 times a day   Boswellia-Glucosamine-Vit D (OSTEO BI-FLEX ONE PER DAY PO) Take 1 tablet by mouth daily.   Budeson-Glycopyrrol-Formoterol (BREZTRI AEROSPHERE) 160-9-4.8 MCG/ACT AERO Inhale 2 puffs into the lungs in the morning and at bedtime.   Calcium Carb-Cholecalciferol (CALCIUM + VITAMIN D3 PO) Take by mouth daily.   cyanocobalamin (CYANOCOBALAMIN) 500 MCG tablet Take 1 tablet (500 mcg total) by mouth once a week.   cyclobenzaprine (FLEXERIL) 10 MG tablet TAKE 1/2 TO 1 TABLET BY MOUTH TWICE A DAY AS NEEDED FOR MUSCLE SPASMS AND MIGRAINE   famotidine (PEPCID) 20 MG tablet Take 20 mg by mouth every other day. Alternates with omeprazole   gabapentin (NEURONTIN) 100 MG capsule TAKE 1 TO 3 CAPSULES AT    BEDTIME   glucose blood (ONETOUCH ULTRA TEST) test strip Use as instructed to check sugars twice daily E11.69   IBUPROFEN PO Take by mouth as needed.   Iron, Ferrous Sulfate, 325 (65 Fe) MG TABS Take 325 mg by mouth once a week.   Lancets (ONETOUCH DELICA PLUS LANCET33G) MISC Use as instructed to check blood sugar 2 times a day   levothyroxine (SYNTHROID) 100 MCG tablet TAKE 1 TABLET DAILY, AND ON2 DAYS A WEEK, TAKE AN     EXTRA 1/2 TABLET   loratadine (CLARITIN) 10 MG tablet Take 1 tablet (10 mg total) by mouth daily.   metFORMIN (GLUCOPHAGE)  500 MG tablet Take 1 tablet (500 mg total) by mouth daily with breakfast.   metoprolol succinate (TOPROL-XL) 25 MG 24 hr tablet TAKE 1/2 TABLET BY MOUTH EVERY DAY   mometasone (ELOCON) 0.1 % cream Apply 1 Application topically daily as needed (Rash).   Multiple Vitamin (MULTIVITAMIN WITH MINERALS) TABS tablet Take 1 tablet by mouth daily. One A Day for Women   mupirocin ointment (BACTROBAN) 2 % Apply 1 Application topically 2 (two) times daily.   omeprazole (PRILOSEC) 20 MG capsule TAKE 1 CAPSULE EVERY OTHER DAY. ALTERNATE WITH        FAMOTIDINE 20MG    ONETOUCH VERIO test strip USE AS INSTRUCTED TO CHECK BLOOD SUGAR 2 TIMES A DAY   OXYGEN Inhale 2 L into the lungs at bedtime.   ranolazine (RANEXA) 500 MG 12 hr tablet Take 1 tablet (500 mg  total) by mouth 2 (two) times daily.   Rhubarb (ESTROVEN COMPLETE PO) Take 1 tablet by mouth daily.   Semaglutide, 2 MG/DOSE, (OZEMPIC, 2 MG/DOSE,) 8 MG/3ML SOPN INJECT 2 MG AS DIRECTED ONCE A WEEK.   simvastatin (ZOCOR) 40 MG tablet Take 1 tablet (40 mg total) by mouth at bedtime.   torsemide (DEMADEX) 20 MG tablet Take 1.5 tablets (30 mg total) by mouth daily.   traMADol (ULTRAM) 50 MG tablet TAKE 1 TABLET (50 MG TOTAL) BY MOUTH TWICE A DAY AS NEEDED FOR MODERATE PAIN   Turmeric 500 MG CAPS Take 1 capsule by mouth 2 (two) times daily as needed.   ziprasidone (GEODON) 40 MG capsule TAKE 1 CAPSULE TWICE DAILY WITH MEALS     Allergies:   Crestor [rosuvastatin], Lipitor [atorvastatin], Lithium, and Quetiapine   Social History   Socioeconomic History   Marital status: Married    Spouse name: Not on file   Number of children: Not on file   Years of education: Not on file   Highest education level: 12th grade  Occupational History   Occupation: disabled    Employer: OTHER  Tobacco Use   Smoking status: Former    Current packs/day: 0.00    Average packs/day: 2.0 packs/day for 43.0 years (86.0 ttl pk-yrs)    Types: Cigarettes    Start date: 05/18/1967     Quit date: 05/17/2010    Years since quitting: 13.5   Smokeless tobacco: Never   Tobacco comments:    QUIT 2011  Vaping Use   Vaping status: Never Used  Substance and Sexual Activity   Alcohol use: No    Alcohol/week: 0.0 standard drinks of alcohol   Drug use: No   Sexual activity: Yes    Partners: Male  Other Topics Concern   Not on file  Social History Narrative   Caffeine: 2-3 coffee/am, 1 cup tea in afternoon; Lives with husband, 1 dog, 2 cats; Occupation: disability since 2009 for bipolar, previously worked at [employer]; Diet: not many fruits, good vegetables, good amt water.      Smoker 43 years; quit in 2011. No alcohol. Lives in [place]; with husband.    Social Drivers of Corporate investment banker Strain: Low Risk  (11/19/2023)   Overall Financial Resource Strain (CARDIA)    Difficulty of Paying Living Expenses: Not hard at all  Food Insecurity: No Food Insecurity (11/19/2023)   Hunger Vital Sign    Worried About Running Out of Food in the Last Year: Never true    Ran Out of Food in the Last Year: Never true  Transportation Needs: No Transportation Needs (11/19/2023)   PRAPARE - Administrator, Civil Service (Medical): No    Lack of Transportation (Non-Medical): No  Physical Activity: Inactive (11/19/2023)   Exercise Vital Sign    Days of Exercise per Week: 0 days    Minutes of Exercise per Session: 20 min  Stress: No Stress Concern Present (11/19/2023)   [employer] of Occupational Health - Occupational Stress Questionnaire    Feeling of Stress : Not at all  Social Connections: Moderately Isolated (11/19/2023)   Social Connection and Isolation Panel [NHANES]    Frequency of Communication with Friends and Family: More than three times a week    Frequency of Social Gatherings with Friends and Family: More than three times a week    Attends Religious Services: Never    Database administrator or Organizations: No    Attends  Engineer, structural:  Not on file    Marital Status: Married     Family History: The patient's family history includes Acute lymphoblastic leukemia (age of onset: 3) in her cousin; Aneurysm in her maternal uncle; Arthritis in her mother; Asthma in her maternal uncle; Breast cancer in her maternal aunt; COPD in her sister; Cancer in her maternal uncle and paternal uncle; Cancer (age of onset: 104) in her father; Cancer (age of onset: 38) in her maternal aunt; Colon cancer (age of onset: 27) in her maternal uncle; Diabetes in her father; Hypertension in her mother; Stroke in her mother; Thyroid disease in her father. There is no history of Coronary artery disease, Stomach cancer, Rectal cancer, or Esophageal cancer.  ROS:   Please see the history of present illness.     All other systems reviewed and are negative.  EKGs/Labs/Other Studies Reviewed:    The following studies were reviewed today:     Recent Labs: 11/16/2023: ALT 13; BUN 10; Creatinine, Ser 0.87; Hemoglobin 13.6; Platelets 411.0; Potassium 4.4; Sodium 135; TSH 0.97  Recent Lipid Panel    Component Value Date/Time   CHOL 145 11/16/2023 0805   TRIG 238.0 (H) 11/16/2023 0805   HDL 43.00 11/16/2023 0805   CHOLHDL 3 11/16/2023 0805   VLDL 47.6 (H) 11/16/2023 0805   LDLCALC 54 11/16/2023 0805   LDLDIRECT 71 03/15/2023 1136     Risk Assessment/Calculations:      Physical Exam:    VS:  BP 104/66 (BP Location: Left Arm, Patient Position: Sitting, Cuff Size: Normal)   Pulse 86   Ht 5\' 2"  (1.575 m)   Wt 210 lb 6.4 oz (95.4 kg)   SpO2 93%   BMI 38.48 kg/m     Wt Readings from Last 3 Encounters:  11/21/23 210 lb 6.4 oz (95.4 kg)  07/24/23 216 lb 4 oz (98.1 kg)  07/23/23 218 lb 12.8 oz (99.2 kg)     GEN:  Well nourished, well developed in no acute distress HEENT: Normal NECK: No JVD; No carotid bruits CARDIAC: RRR, no murmurs, rubs, gallops RESPIRATORY:  Clear to auscultation, decreased breath sounds at bases ABDOMEN: Soft, non-tender,  distended MUSCULOSKELETAL:  No edema; left-sided chest tenderness noted. SKIN: Warm and dry NEUROLOGIC:  Alert and oriented x 3 PSYCHIATRIC:  Normal affect   ASSESSMENT:    1. Coronary artery disease of native artery of native heart with stable angina pectoris (HCC)   2. Primary hypertension   3. Hyperlipidemia LDL goal <70   4. BMI 38.0-38.9,adult    PLAN:    In order of problems listed above:  CAD/CABG x1 on 07/2020.  Cardiac PET 09/2023 normal perfusion with no ischemia.  Stable angina, continue Ranexa 500 mg twice daily, Toprol-XL 25 mg daily, Imdur 30 mg daily, aspirin, simvastatin 40 mg daily. EF 50-55%.   Hypertension, BP controlled.  Continue Toprol-XL 25 mg daily, Imdur 30 mg daily. hyperlipidemia.  LDL at goal, continue simvastatin. morbidly obese, patient is diabetic, continue Ozempic.   Follow-up in 6-12 months.      Medication Adjustments/Labs and Tests Ordered: Current medicines are reviewed at length with the patient today.  Concerns regarding medicines are outlined above.  No orders of the defined types were placed in this encounter.    No orders of the defined types were placed in this encounter.     Patient Instructions  Medication Instructions:   Your Physician recommend you continue on your current medication as directed.     *  If you need a refill on your cardiac medications before your next appointment, please call your pharmacy*  Lab Work: None ordered.  If you have labs (blood work) drawn today and your tests are completely normal, you will receive your results only by: MyChart Message (if you have MyChart) OR A paper copy in the mail If you have any lab test that is abnormal or we need to change your treatment, we will call you to review the results.  Testing/Procedures: None ordered.   Follow-Up: At [facility], you and your health needs are our priority.  As part of our continuing mission to provide you with exceptional heart  care, our providers are all part of one team.  This team includes your primary Cardiologist (physician) and Advanced Practice Providers or APPs (Physician Assistants and Nurse Practitioners) who all work together to provide you with the care you need, when you need it.  Your next appointment:   12 month(s)  Provider:   You may see Debbe Odea, MD or one of the following Advanced Practice Providers on your designated Care Team:   Nicolasa Ducking, NP Ames Dura, PA-C Eula Listen, PA-C Cadence [place], PA-C Charlsie Quest, NP Carlos Levering, NP    We recommend signing up for the patient portal called "MyChart".  Sign up information is provided on this After Visit Summary.  MyChart is used to connect with patients for Virtual Visits (Telemedicine).  Patients are able to view lab/test results, encounter notes, upcoming appointments, etc.  Non-urgent messages can be sent to your provider as well.   To learn more about what you can do with MyChart, go to [email or web address].            Signed, Debbe Odea, MD  11/21/2023 11:43 AM     [facility]

## 2023-11-21 NOTE — Patient Instructions (Signed)
 Medication Instructions:   Your Physician recommend you continue on your current medication as directed.     *If you need a refill on your cardiac medications before your next appointment, please call your pharmacy*  Lab Work: None ordered.  If you have labs (blood work) drawn today and your tests are completely normal, you will receive your results only by: MyChart Message (if you have MyChart) OR A paper copy in the mail If you have any lab test that is abnormal or we need to change your treatment, we will call you to review the results.  Testing/Procedures: None ordered.   Follow-Up: At [facility], you and your health needs are our priority.  As part of our continuing mission to provide you with exceptional heart care, our providers are all part of one team.  This team includes your primary Cardiologist (physician) and Advanced Practice Providers or APPs (Physician Assistants and Nurse Practitioners) who all work together to provide you with the care you need, when you need it.  Your next appointment:   12 month(s)  Provider:   You may see Debbe Odea, MD or one of the following Advanced Practice Providers on your designated Care Team:   Nicolasa Ducking, NP Ames Dura, PA-C Eula Listen, PA-C Cadence [place], PA-C Charlsie Quest, NP Carlos Levering, NP    We recommend signing up for the patient portal called "MyChart".  Sign up information is provided on this After Visit Summary.  MyChart is used to connect with patients for Virtual Visits (Telemedicine).  Patients are able to view lab/test results, encounter notes, upcoming appointments, etc.  Non-urgent messages can be sent to your provider as well.   To learn more about what you can do with MyChart, go to [email or web address].

## 2023-11-23 ENCOUNTER — Encounter: Payer: Self-pay | Admitting: Family Medicine

## 2023-11-23 ENCOUNTER — Ambulatory Visit (INDEPENDENT_AMBULATORY_CARE_PROVIDER_SITE_OTHER): Payer: Medicare HMO | Admitting: Family Medicine

## 2023-11-23 VITALS — BP 112/64 | HR 87 | Temp 98.1°F | Ht 60.5 in | Wt 207.5 lb

## 2023-11-23 DIAGNOSIS — R221 Localized swelling, mass and lump, neck: Secondary | ICD-10-CM

## 2023-11-23 DIAGNOSIS — Z1211 Encounter for screening for malignant neoplasm of colon: Secondary | ICD-10-CM

## 2023-11-23 DIAGNOSIS — K219 Gastro-esophageal reflux disease without esophagitis: Secondary | ICD-10-CM

## 2023-11-23 DIAGNOSIS — I10 Essential (primary) hypertension: Secondary | ICD-10-CM

## 2023-11-23 DIAGNOSIS — E611 Iron deficiency: Secondary | ICD-10-CM

## 2023-11-23 DIAGNOSIS — K439 Ventral hernia without obstruction or gangrene: Secondary | ICD-10-CM | POA: Diagnosis not present

## 2023-11-23 DIAGNOSIS — N390 Urinary tract infection, site not specified: Secondary | ICD-10-CM | POA: Diagnosis not present

## 2023-11-23 DIAGNOSIS — D649 Anemia, unspecified: Secondary | ICD-10-CM

## 2023-11-23 DIAGNOSIS — I25118 Atherosclerotic heart disease of native coronary artery with other forms of angina pectoris: Secondary | ICD-10-CM

## 2023-11-23 DIAGNOSIS — J4540 Moderate persistent asthma, uncomplicated: Secondary | ICD-10-CM

## 2023-11-23 DIAGNOSIS — E039 Hypothyroidism, unspecified: Secondary | ICD-10-CM

## 2023-11-23 DIAGNOSIS — E1169 Type 2 diabetes mellitus with other specified complication: Secondary | ICD-10-CM

## 2023-11-23 DIAGNOSIS — F3178 Bipolar disorder, in full remission, most recent episode mixed: Secondary | ICD-10-CM

## 2023-11-23 DIAGNOSIS — M797 Fibromyalgia: Secondary | ICD-10-CM

## 2023-11-23 DIAGNOSIS — M545 Low back pain, unspecified: Secondary | ICD-10-CM

## 2023-11-23 DIAGNOSIS — H9193 Unspecified hearing loss, bilateral: Secondary | ICD-10-CM

## 2023-11-23 DIAGNOSIS — D75839 Thrombocytosis, unspecified: Secondary | ICD-10-CM

## 2023-11-23 DIAGNOSIS — Z0000 Encounter for general adult medical examination without abnormal findings: Secondary | ICD-10-CM | POA: Diagnosis not present

## 2023-11-23 DIAGNOSIS — M150 Primary generalized (osteo)arthritis: Secondary | ICD-10-CM | POA: Diagnosis not present

## 2023-11-23 DIAGNOSIS — Z7189 Other specified counseling: Secondary | ICD-10-CM

## 2023-11-23 DIAGNOSIS — E785 Hyperlipidemia, unspecified: Secondary | ICD-10-CM

## 2023-11-23 DIAGNOSIS — R7989 Other specified abnormal findings of blood chemistry: Secondary | ICD-10-CM

## 2023-11-23 LAB — POC URINALSYSI DIPSTICK (AUTOMATED)
Bilirubin, UA: NEGATIVE
Glucose, UA: NEGATIVE
Ketones, UA: NEGATIVE
Nitrite, UA: NEGATIVE
Protein, UA: NEGATIVE
Spec Grav, UA: 1.01 (ref 1.010–1.025)
Urobilinogen, UA: 0.2 U/dL
pH, UA: 6 (ref 5.0–8.0)

## 2023-11-23 MED ORDER — CYCLOBENZAPRINE HCL 10 MG PO TABS: ORAL_TABLET | ORAL | 3 refills | Status: DC

## 2023-11-23 MED ORDER — LEVOTHYROXINE SODIUM 100 MCG PO TABS: 100.0000 ug | ORAL_TABLET | Freq: Every day | ORAL | 4 refills | Status: AC

## 2023-11-23 MED ORDER — METFORMIN HCL 500 MG PO TABS: 500.0000 mg | ORAL_TABLET | Freq: Every day | ORAL | 4 refills | Status: DC

## 2023-11-23 MED ORDER — AMOXICILLIN-POT CLAVULANATE 875-125 MG PO TABS: 1.0000 | ORAL_TABLET | Freq: Two times a day (BID) | ORAL | 0 refills | Status: AC

## 2023-11-23 MED ORDER — SIMVASTATIN 40 MG PO TABS: 40.0000 mg | ORAL_TABLET | Freq: Every day | ORAL | 4 refills | Status: AC

## 2023-11-23 MED ORDER — TRAMADOL HCL 50 MG PO TABS: ORAL_TABLET | ORAL | 0 refills | Status: DC

## 2023-11-23 MED ORDER — METOPROLOL SUCCINATE ER 25 MG PO TB24: 12.5000 mg | ORAL_TABLET | Freq: Every day | ORAL | 4 refills | Status: AC

## 2023-11-23 MED ORDER — OMEPRAZOLE 20 MG PO CPDR: 20.0000 mg | DELAYED_RELEASE_CAPSULE | ORAL | 4 refills | Status: AC

## 2023-11-23 MED ORDER — GABAPENTIN 300 MG PO CAPS: 300.0000 mg | ORAL_CAPSULE | Freq: Two times a day (BID) | ORAL | 1 refills | Status: DC

## 2023-11-23 MED ORDER — VITAMIN B-12 500 MCG PO TABS: 500.0000 ug | ORAL_TABLET | ORAL | Status: AC

## 2023-11-23 MED ORDER — ZIPRASIDONE HCL 40 MG PO CAPS: 40.0000 mg | ORAL_CAPSULE | Freq: Two times a day (BID) | ORAL | 4 refills | Status: AC

## 2023-11-23 NOTE — Progress Notes (Signed)
 Ph: [phone] Fax: [phone]   Patient ID: [name], female    DOB: [birth year], 73 y.o.   MRN: [id number]  This visit was conducted in person.  BP 112/64   Pulse 87   Temp 98.1 F (36.7 C) (Oral)   Ht 5' 0.5" (1.537 m)   Wt 207 lb 8 oz (94.1 kg)   SpO2 96%   BMI 39.86 kg/m    CC: AMW/CPE Subjective:   HPI: [name] is a 73 y.o. female presenting on 11/23/2023 for Medicare Wellness (C/o back pain.)   Did not see health advisor.   Hearing Screening   500Hz  1000Hz  2000Hz  4000Hz   Right ear 40 0 40 40  Left ear 0 0 0 0  Vision Screening - Comments:: Last eye exam, 01/2023.   Flowsheet Row Office Visit from 11/15/2022 in [facility] [place]  PHQ-2 Total Score 0          11/15/2022    3:39 PM 11/15/2022    2:32 PM 11/11/2021    9:40 AM 11/23/2020    1:14 PM 09/01/2020    3:29 PM  Fall Risk   Falls in the past year? 0 0 0 1 0  Number falls in past yr:  0  0   Injury with Fall?  0  0   Risk for fall due to :  No Fall Risks  Medication side effect Impaired mobility  Follow up  Falls prevention discussed;Falls evaluation completed  Falls evaluation completed;Falls prevention discussed Falls prevention discussed;Education provided   Asthma - seeing Dr Jayme Cloud on [place] as well as nocturnal oxygen.   CAD s/p 1v CABG 2021 with stable angina, regularly sees cardiology Dr Azucena Cecil. Ranexa 500mg  bid 07/2023 along with Toprol XL and imdur.    Advanced R knee osteoarthritis sees ortho Dr Martha Clan latest steroid injection 10/19/2023. Not interested in knee surgery.   This morning noted significant lower back pain from midline back to lower spine. No falls or injury, lumbar radiculopathy, numbness/weakness of legs, bowel/bladder incontinence. Managing with tylenol and turmeric.   Notes swelling to neck glands present for several days. No significant ST, congestion, cough, fever.    Preventative: COLONOSCOPY Date: 12/2011  hyperplastic polyp x2, diverticulosis, rec rpt 10 yrs Russella Dar) - last year referred to [facility] but appt never scheduled. She requests Cologuard this year.  Mammogram 12/204 - [facility]  Well woman - pap 2015 WNL. S/p total hysterectomy with bilat oophorectomy (fibroids) but cervix remains. Bladder sling placed at that time (2014). Always normal pap smears. We attempted pap smear 2019 unsuccessfully due to mobility issues, unable to visualize cervix - pt declines repeat.  Lung cancer screening - started 03/2020, latest 01/2023 DEXA 04/2021 - WNL Flu yearly COVID vaccine - Moderna 09/2019, 10/2019, booster 06/2020, 12/2020, bivalent 06/2021, again 06/2022 Pneumovax 2012, prevnar 2019  Td 2012, Tdap 06/2023 RSV 06/2022 Zostavax - 2015 Shingrix - 10/2017, 12/2017 Advanced directives: does not have set in place.  Packet previously provided. Husband would be HCPOA.  Seat belt use discussed  Sunscreen use discussed. No changing moles.  Quit smoking 2011 (1-2 ppd) 30+ PY hx.  Alcohol - none Dentist - bought dentures but couldn't wear due to gag reflex Eye exam - yearly has appt 01/2023 Bowel - no constipation  Bladder - no incontinence    Caffeine: 2 coffee/am Lives with husband, 1 dog, 2 cats Occupation: disability since 2009 for bipolar, previously worked at  [employer] Activity: tries to do arm exercises twice a week.  Diet: not many fruits, good vegetables, good amt water      Relevant past medical, surgical, family and social history reviewed and updated as indicated. Interim medical history since our last visit reviewed. Allergies and medications reviewed and updated. Outpatient Medications Prior to Visit  Medication Sig Dispense Refill   acetaminophen (TYLENOL 8 HOUR ARTHRITIS PAIN) 650 MG CR tablet Take 650 mg by mouth every 8 (eight) hours as needed for pain.     albuterol (VENTOLIN HFA) 108 (90 Base) MCG/ACT inhaler Inhale 2 puffs into the lungs every 6 (six) hours as needed  for wheezing or shortness of breath. 18 g 3   aspirin 81 MG EC tablet Take 1 tablet (81 mg total) by mouth daily.     aspirin-acetaminophen-caffeine (EXCEDRIN MIGRAINE) 250-250-65 MG tablet Take by mouth every 6 (six) hours as needed for headache.     Blood Glucose Monitoring Suppl (ONETOUCH VERIO) w/Device KIT Use as instructed to check blood sugar 2 times a day 1 kit 0   Boswellia-Glucosamine-Vit D (OSTEO BI-FLEX ONE PER DAY PO) Take 1 tablet by mouth daily.     Budeson-Glycopyrrol-Formoterol (BREZTRI AEROSPHERE) 160-9-4.8 MCG/ACT AERO Inhale 2 puffs into the lungs in the morning and at bedtime. 3 each 3   Calcium Carb-Cholecalciferol (CALCIUM + VITAMIN D3 PO) Take by mouth daily.     famotidine (PEPCID) 20 MG tablet Take 20 mg by mouth every other day. Alternates with omeprazole     glucose blood (ONETOUCH ULTRA TEST) test strip Use as instructed to check sugars twice daily E11.69 100 each 12   IBUPROFEN PO Take by mouth as needed.     Iron, Ferrous Sulfate, 325 (65 Fe) MG TABS Take 325 mg by mouth once a week.     Lancets (ONETOUCH DELICA PLUS LANCET33G) MISC Use as instructed to check blood sugar 2 times a day 200 each 3   loratadine (CLARITIN) 10 MG tablet Take 1 tablet (10 mg total) by mouth daily.     mometasone (ELOCON) 0.1 % cream Apply 1 Application topically daily as needed (Rash). 45 g 1   Multiple Vitamin (MULTIVITAMIN WITH MINERALS) TABS tablet Take 1 tablet by mouth daily. One A Day for Women     mupirocin ointment (BACTROBAN) 2 % Apply 1 Application topically 2 (two) times daily. 15 g 3   ONETOUCH VERIO test strip USE AS INSTRUCTED TO CHECK BLOOD SUGAR 2 TIMES A DAY 200 strip 3   OXYGEN Inhale 2 L into the lungs at bedtime.     ranolazine (RANEXA) 500 MG 12 hr tablet Take 1 tablet (500 mg total) by mouth 2 (two) times daily. 180 tablet 3   Rhubarb (ESTROVEN COMPLETE PO) Take 1 tablet by mouth daily.     Semaglutide, 2 MG/DOSE, (OZEMPIC, 2 MG/DOSE,) 8 MG/3ML SOPN INJECT 2 MG AS  DIRECTED ONCE A WEEK. 3 mL 6   torsemide (DEMADEX) 20 MG tablet Take 1.5 tablets (30 mg total) by mouth daily. 135 tablet 3   Turmeric 500 MG CAPS Take 1 capsule by mouth 2 (two) times daily as needed.     cyanocobalamin (CYANOCOBALAMIN) 500 MCG tablet Take 1 tablet (500 mcg total) by mouth once a week.     cyclobenzaprine (FLEXERIL) 10 MG tablet TAKE 1/2 TO 1 TABLET BY MOUTH TWICE A DAY AS NEEDED FOR MUSCLE SPASMS AND MIGRAINE 30 tablet 3   gabapentin (NEURONTIN) 100 MG capsule TAKE 1 TO 3  CAPSULES AT    BEDTIME 200 capsule 1   levothyroxine (SYNTHROID) 100 MCG tablet TAKE 1 TABLET DAILY, AND ON2 DAYS A WEEK, TAKE AN     EXTRA 1/2 TABLET 90 tablet 1   metFORMIN (GLUCOPHAGE) 500 MG tablet Take 1 tablet (500 mg total) by mouth daily with breakfast. 90 tablet 3   metoprolol succinate (TOPROL-XL) 25 MG 24 hr tablet TAKE 1/2 TABLET BY MOUTH EVERY DAY 45 tablet 0   omeprazole (PRILOSEC) 20 MG capsule TAKE 1 CAPSULE EVERY OTHER DAY. ALTERNATE WITH        FAMOTIDINE 20MG  45 capsule 3   simvastatin (ZOCOR) 40 MG tablet Take 1 tablet (40 mg total) by mouth at bedtime. 90 tablet 4   traMADol (ULTRAM) 50 MG tablet TAKE 1 TABLET (50 MG TOTAL) BY MOUTH TWICE A DAY AS NEEDED FOR MODERATE PAIN 30 tablet 1   ziprasidone (GEODON) 40 MG capsule TAKE 1 CAPSULE TWICE DAILY WITH MEALS 180 capsule 3   isosorbide mononitrate (IMDUR) 60 MG 24 hr tablet TAKE 0.5 TABLETS (30 MG TOTAL) BY MOUTH 2 (TWO) TIMES DAILY. 90 tablet 3   Facility-Administered Medications Prior to Visit  Medication Dose Route Frequency Provider Last Rate Last Admin   albuterol (PROVENTIL) (2.5 MG/3ML) 0.083% nebulizer solution 2.5 mg  2.5 mg Nebulization Once Salena Saner, MD         Per HPI unless specifically indicated in ROS section below Review of Systems  Constitutional:  Negative for activity change, appetite change, chills, fatigue, fever and unexpected weight change.  HENT:  Negative for hearing loss.   Eyes:  Negative for visual  disturbance.  Respiratory:  Negative for cough, chest tightness, shortness of breath and wheezing.   Cardiovascular:  Negative for chest pain, palpitations and leg swelling.  Gastrointestinal:  Negative for abdominal distention, abdominal pain, blood in stool, constipation, diarrhea, nausea and vomiting.  Genitourinary:  Negative for difficulty urinating and hematuria.  Musculoskeletal:  Negative for arthralgias, myalgias and neck pain.  Skin:  Negative for rash.  Neurological:  Negative for dizziness, seizures, syncope and headaches.  Hematological:  Negative for adenopathy. Does not bruise/bleed easily.  Psychiatric/Behavioral:  Negative for dysphoric mood. The patient is not nervous/anxious.     Objective:  BP 112/64   Pulse 87   Temp 98.1 F (36.7 C) (Oral)   Ht 5' 0.5" (1.537 m)   Wt 207 lb 8 oz (94.1 kg)   SpO2 96%   BMI 39.86 kg/m   Wt Readings from Last 3 Encounters:  11/23/23 207 lb 8 oz (94.1 kg)  11/21/23 210 lb 6.4 oz (95.4 kg)  07/24/23 216 lb 4 oz (98.1 kg)      Physical Exam Vitals and nursing note reviewed.  Constitutional:      Appearance: Normal appearance. She is not ill-appearing.     Comments: Ambulates with walker   HENT:     Head: Normocephalic and atraumatic.     Right Ear: Tympanic membrane, ear canal and external ear normal. There is impacted cerumen.     Left Ear: Tympanic membrane, ear canal and external ear normal. There is impacted cerumen.     Mouth/Throat:     Mouth: Mucous membranes are moist.     Pharynx: Oropharynx is clear. No oropharyngeal exudate or posterior oropharyngeal erythema.  Eyes:     General:        Right eye: No discharge.        Left eye: No discharge.  Extraocular Movements: Extraocular movements intact.     Conjunctiva/sclera: Conjunctivae normal.     Pupils: Pupils are equal, round, and reactive to light.  Neck:     Thyroid: No thyroid mass or thyromegaly.      Comments: Tender swollen bilateral submandibular  salivary glands Cardiovascular:     Rate and Rhythm: Normal rate and regular rhythm.     Pulses: Normal pulses.     Heart sounds: Normal heart sounds. No murmur heard. Pulmonary:     Effort: Pulmonary effort is normal. No respiratory distress.     Breath sounds: Normal breath sounds. No wheezing, rhonchi or rales.  Abdominal:     General: Bowel sounds are normal. There is no distension.     Palpations: Abdomen is soft. There is no mass.     Tenderness: There is no abdominal tenderness. There is no guarding or rebound.     Hernia: A hernia is present. Hernia is present in the ventral area (mildly tender, present with sitting in chair). There is no hernia in the umbilical area.    Musculoskeletal:     Cervical back: Normal range of motion and neck supple. No rigidity.     Right lower leg: No edema.     Left lower leg: No edema.     Comments:  Midline lumbar spine tenderness  Lymphadenopathy:     Cervical: No cervical adenopathy.  Skin:    General: Skin is warm and dry.     Findings: No rash.  Neurological:     General: No focal deficit present.     Mental Status: She is alert. Mental status is at baseline.     Comments:  Recall 3/3 Calculation 5/5 DLROW  Psychiatric:        Mood and Affect: Mood normal.        Behavior: Behavior normal.       Results for orders placed or performed in visit on 11/23/23  POCT Urinalysis Dipstick (Automated)   Collection Time: 11/23/23 11:06 AM  Result Value Ref Range   Color, UA yellow    Clarity, UA cloudy    Glucose, UA Negative Negative   Bilirubin, UA negative    Ketones, UA negative    Spec Grav, UA 1.010 1.010 - 1.025   Blood, UA +/-    pH, UA 6.0 5.0 - 8.0   Protein, UA Negative Negative   Urobilinogen, UA 0.2 0.2 or 1.0 E.U./dL   Nitrite, UA negative    Leukocytes, UA Large (3+) (A) Negative   Lab Results  Component Value Date   CHOL 145 11/16/2023   HDL 43.00 11/16/2023   LDLCALC 54 11/16/2023   LDLDIRECT 71 03/15/2023    TRIG 238.0 (H) 11/16/2023   CHOLHDL 3 11/16/2023    Lab Results  Component Value Date   NA 135 11/16/2023   CL 96 11/16/2023   K 4.4 11/16/2023   CO2 28 11/16/2023   BUN 10 11/16/2023   CREATININE 0.87 11/16/2023   GFR 67.15 11/16/2023   CALCIUM 9.9 11/16/2023   ALBUMIN 4.4 11/16/2023   GLUCOSE 100 (H) 11/16/2023   Lab Results  Component Value Date   ALT 13 11/16/2023   AST 13 11/16/2023   ALKPHOS 49 11/16/2023   BILITOT 0.4 11/16/2023   Lab Results  Component Value Date   WBC 9.4 11/16/2023   HGB 13.6 11/16/2023   HCT 39.7 11/16/2023   MCV 96.3 11/16/2023   PLT 411.0 (H) 11/16/2023    Lab Results  Component Value Date   TSH 0.97 11/16/2023   Lab Results  Component Value Date   HGBA1C 6.0 11/16/2023    No results found for: "25OHVITD2", "25OHVITD3", "VD25OH"  Lab Results  Component Value Date   VITAMINB12 508 11/16/2023   Assessment & Plan:   Problem List Items Addressed This Visit     Medicare annual wellness visit, subsequent - Primary (Chronic)   I have personally reviewed the Medicare Annual Wellness questionnaire and have noted 1. The patient's medical and social history 2. Their use of alcohol, tobacco or illicit drugs 3. Their current medications and supplements 4. The patient's functional ability including ADL's, fall risks, home safety risks and hearing or visual impairment. Cognitive function has been assessed and addressed as indicated.  5. Diet and physical activity 6. Evidence for depression or mood disorders The patients weight, height, BMI have been recorded in the chart. I have made referrals, counseling and provided education to the patient based on review of the above and I have provided the pt with a written personalized care plan for preventive services. Provider list updated.. See scanned questionairre as needed for further documentation. Reviewed preventative protocols and updated unless pt declined.       Advanced care  planning/counseling discussion (Chronic)   Advanced directives: does not have set in place.  Packet previously provided. Husband would be HCPOA.       Health maintenance examination (Chronic)   Preventative protocols reviewed and updated unless pt declined. Discussed healthy diet and lifestyle.  Overdue for colon cancer screen. Discussed options - will set patient up with Cologuard.       Type 2 diabetes mellitus with other specified complication (HCC)   Chronic, great control on metformin 500mg  daily and ozempic 2mg  weekly - continue this. Continued weight loss noted.       Relevant Medications   metFORMIN (GLUCOPHAGE) 500 MG tablet   simvastatin (ZOCOR) 40 MG tablet   CAD (coronary artery disease)   S/p 1v CABG 2021  Ongoing angina despite Toprol XL and imdur. Now on Ranexa.  Appreciate cardiology care.       Relevant Medications   metoprolol succinate (TOPROL-XL) 25 MG 24 hr tablet   simvastatin (ZOCOR) 40 MG tablet   Hypothyroidism   Chronic, stable on current regimen - continue.       Relevant Medications   levothyroxine (SYNTHROID) 100 MCG tablet   metoprolol succinate (TOPROL-XL) 25 MG 24 hr tablet   Hypertension   Chronic, great control on current regimen of toprol XL, imdur and torsemide 30mg  daily.       Relevant Medications   metoprolol succinate (TOPROL-XL) 25 MG 24 hr tablet   simvastatin (ZOCOR) 40 MG tablet   Bipolar disorder (HCC)   Chronic, longstanding stable period on geodon 40mg  BID - continue.       Relevant Medications   ziprasidone (GEODON) 40 MG capsule   Fibromyalgia   Relevant Medications   cyclobenzaprine (FLEXERIL) 10 MG tablet   gabapentin (NEURONTIN) 300 MG capsule   traMADol (ULTRAM) 50 MG tablet   Hyperlipidemia associated with type 2 diabetes mellitus (HCC)   Chronic, stable on simvastatin 40mg  daily - continue. Triglycerides remain high - encouraged diet choices to improve triglyceride control.  The 10-year ASCVD risk score  (Arnett DK, et al., 2019) is: 19.1%   Values used to calculate the score:     Age: 39 years     Sex: Female     Is Non-Hispanic African American: No  Diabetic: Yes     Tobacco smoker: No     Systolic Blood Pressure: 112 mmHg     Is BP treated: Yes     HDL Cholesterol: 43 mg/dL     Total Cholesterol: 145 mg/dL       Relevant Medications   metFORMIN (GLUCOPHAGE) 500 MG tablet   metoprolol succinate (TOPROL-XL) 25 MG 24 hr tablet   simvastatin (ZOCOR) 40 MG tablet   GERD (gastroesophageal reflux disease)   Managed with omeprazole 20mg  and pepcid 20mg  alternating every other day.       Relevant Medications   omeprazole (PRILOSEC) 20 MG capsule   Hearing loss   Noted on screening exam today. Pt doesn't note significant trouble  - will let me know if desires audiology eval      Neck swelling   Exam suspicious for bilateral submandibular sialoadenitis - ?infectious vs inflammatory.  Rx augmentin 7d course.  If ongoing, consider further evaluation r/o autoimmune condition      Severe obesity (BMI 35.0-39.9) with comorbidity (HCC)   Continued weight loss noted - congratulated. Obesity complicated by comorbidites of OA, HTN, HLD, DM, CAD      Relevant Medications   metFORMIN (GLUCOPHAGE) 500 MG tablet   Thrombocytosis   Chronic, mild ?inflammation related - continue to monitor.       Osteoarthritis   Advanced R knee osteoarthritis followed by ortho, latest steroid injection 09/2023.  She is trying to postpone surgery.  She continues turmeric with rare tramadol use.       Relevant Medications   cyclobenzaprine (FLEXERIL) 10 MG tablet   traMADol (ULTRAM) 50 MG tablet   Anemia   This is improved with iron replacement.       Relevant Medications   vitamin B-12 (CYANOCOBALAMIN) 500 MCG tablet (Start on 11/26/2023)   Asthma   Appreciate pulm care - continue Breztri.       Back pain   Notes acutely worse lower back pain with radiation to bilateral lower back, no  radiculopathy, no trauma. Check UA/Cx today - await culture results.  Will also increase gabapentin to 300mg  BID.       Relevant Medications   cyclobenzaprine (FLEXERIL) 10 MG tablet   traMADol (ULTRAM) 50 MG tablet   Other Relevant Orders   POCT Urinalysis Dipstick (Automated) (Completed)   Urine Culture   Elevated vitamin B12 level   B12 levels stable - continue twice weekly b12.       Iron deficiency   Iron panel improving on weekly oral iron.       Ventral hernia   Moderate sized presumed ventral hernia present on exam.  Discussed this with patient, reviewing reasons to seek urgent care.       Other Visit Diagnoses       Special screening for malignant neoplasms, colon       Relevant Orders   Cologuard        Meds ordered this encounter  Medications   cyclobenzaprine (FLEXERIL) 10 MG tablet    Sig: TAKE 1/2 TO 1 TABLET BY MOUTH TWICE A DAY AS NEEDED FOR MUSCLE SPASMS AND MIGRAINE    Dispense:  30 tablet    Refill:  3   gabapentin (NEURONTIN) 300 MG capsule    Sig: Take 1 capsule (300 mg total) by mouth 2 (two) times daily.    Dispense:  180 capsule    Refill:  1   levothyroxine (SYNTHROID) 100 MCG tablet    Sig: Take 1 tablet (  100 mcg total) by mouth daily before breakfast.    Dispense:  90 tablet    Refill:  4   metFORMIN (GLUCOPHAGE) 500 MG tablet    Sig: Take 1 tablet (500 mg total) by mouth daily with breakfast.    Dispense:  90 tablet    Refill:  4   metoprolol succinate (TOPROL-XL) 25 MG 24 hr tablet    Sig: Take 0.5 tablets (12.5 mg total) by mouth daily.    Dispense:  45 tablet    Refill:  4   omeprazole (PRILOSEC) 20 MG capsule    Sig: Take 1 capsule (20 mg total) by mouth every other day.    Dispense:  45 capsule    Refill:  4   simvastatin (ZOCOR) 40 MG tablet    Sig: Take 1 tablet (40 mg total) by mouth at bedtime.    Dispense:  90 tablet    Refill:  4   ziprasidone (GEODON) 40 MG capsule    Sig: Take 1 capsule (40 mg total) by mouth 2  (two) times daily with a meal.    Dispense:  180 capsule    Refill:  4   amoxicillin-clavulanate (AUGMENTIN) 875-125 MG tablet    Sig: Take 1 tablet by mouth 2 (two) times daily for 7 days.    Dispense:  14 tablet    Refill:  0   traMADol (ULTRAM) 50 MG tablet    Sig: TAKE 1 TABLET (50 MG TOTAL) BY MOUTH TWICE A DAY AS NEEDED FOR MODERATE PAIN    Dispense:  30 tablet    Refill:  0    For chronic pain from osteoarthritis   vitamin B-12 (CYANOCOBALAMIN) 500 MCG tablet    Sig: Take 1 tablet (500 mcg total) by mouth 2 (two) times a week.    Orders Placed This Encounter  Procedures   Urine Culture   Cologuard   POCT Urinalysis Dipstick (Automated)    Patient Instructions  Urinalysis today  Ok to increase gabapentin to 300mg  twice daily.  Medicines refilled.  Let me know if interested in audiology referral.  Work on advanced directives.  Expect Cologuard in the mail.  May try antibiotic for salivary gland swelling - let me know if ongoing for further evaluation.  Good to see you today Return as needed or in 6 months for follow up visit  Follow up plan: Return in about 6 months (around 05/24/2024) for follow up visit.  Eustaquio Boyden, MD

## 2023-11-23 NOTE — Assessment & Plan Note (Signed)

## 2023-11-23 NOTE — Patient Instructions (Addendum)
 Urinalysis today  Ok to increase gabapentin to 300mg  twice daily.  Medicines refilled.  Let me know if interested in audiology referral.  Work on advanced directives.  Expect Cologuard in the mail.  May try antibiotic for salivary gland swelling - let me know if ongoing for further evaluation.  Good to see you today Return as needed or in 6 months for follow up visit

## 2023-11-23 NOTE — Assessment & Plan Note (Addendum)
 Preventative protocols reviewed and updated unless pt declined. Discussed healthy diet and lifestyle.  Overdue for colon cancer screen. Discussed options - will set patient up with Cologuard.

## 2023-11-23 NOTE — Assessment & Plan Note (Signed)
 Advanced directives: does not have set in place.  Packet previously provided. Husband would be HCPOA.

## 2023-11-24 DIAGNOSIS — K439 Ventral hernia without obstruction or gangrene: Secondary | ICD-10-CM | POA: Insufficient documentation

## 2023-11-24 NOTE — Assessment & Plan Note (Signed)
 Exam suspicious for bilateral submandibular sialoadenitis - ?infectious vs inflammatory.  Rx augmentin 7d course.  If ongoing, consider further evaluation r/o autoimmune condition

## 2023-11-24 NOTE — Assessment & Plan Note (Addendum)
 Noted on screening exam today. Pt doesn't note significant trouble  - will let me know if desires audiology eval

## 2023-11-24 NOTE — Assessment & Plan Note (Signed)
 Chronic, mild ?inflammation related - continue to monitor.

## 2023-11-24 NOTE — Assessment & Plan Note (Signed)
 This is improved with iron replacement.

## 2023-11-24 NOTE — Assessment & Plan Note (Signed)
 Chronic, great control on metformin 500mg  daily and ozempic 2mg  weekly - continue this. Continued weight loss noted.

## 2023-11-24 NOTE — Assessment & Plan Note (Signed)
 Chronic, great control on current regimen of toprol XL, imdur and torsemide 30mg  daily.

## 2023-11-24 NOTE — Assessment & Plan Note (Signed)
 Chronic, stable on current regimen - continue.

## 2023-11-24 NOTE — Assessment & Plan Note (Addendum)
 Chronic, longstanding stable period on geodon 40mg  BID - continue.

## 2023-11-24 NOTE — Assessment & Plan Note (Signed)
 S/p 1v CABG 2021  Ongoing angina despite Toprol XL and imdur. Now on Ranexa.  Appreciate cardiology care.

## 2023-11-24 NOTE — Assessment & Plan Note (Signed)
 Chronic, stable on simvastatin 40mg  daily - continue. Triglycerides remain high - encouraged diet choices to improve triglyceride control.  The 10-year ASCVD risk score (Arnett DK, et al., 2019) is: 19.1%   Values used to calculate the score:     Age: 73 years     Sex: Female     Is Non-Hispanic African American: No     Diabetic: Yes     Tobacco smoker: No     Systolic Blood Pressure: 112 mmHg     Is BP treated: Yes     HDL Cholesterol: 43 mg/dL     Total Cholesterol: 145 mg/dL

## 2023-11-24 NOTE — Assessment & Plan Note (Addendum)
 Notes acutely worse lower back pain with radiation to bilateral lower back, no radiculopathy, no trauma. Check UA/Cx today - await culture results.  Will also increase gabapentin to 300mg  BID.

## 2023-11-24 NOTE — Assessment & Plan Note (Signed)
 Appreciate pulm care - continue Breztri.

## 2023-11-24 NOTE — Assessment & Plan Note (Signed)
 Managed with omeprazole 20mg  and pepcid 20mg  alternating every other day.

## 2023-11-24 NOTE — Assessment & Plan Note (Signed)
 Iron panel improving on weekly oral iron.

## 2023-11-24 NOTE — Assessment & Plan Note (Addendum)
 Advanced R knee osteoarthritis followed by ortho, latest steroid injection 09/2023.  She is trying to postpone surgery.  She continues turmeric with rare tramadol use.

## 2023-11-24 NOTE — Assessment & Plan Note (Signed)
 Continued weight loss noted - congratulated. Obesity complicated by comorbidites of OA, HTN, HLD, DM, CAD

## 2023-11-24 NOTE — Assessment & Plan Note (Signed)
 Moderate sized presumed ventral hernia present on exam.  Discussed this with patient, reviewing reasons to seek urgent care.

## 2023-11-24 NOTE — Assessment & Plan Note (Signed)
 B12 levels stable - continue twice weekly b12.

## 2023-11-25 LAB — URINE CULTURE
MICRO NUMBER:: 16290619
SPECIMEN QUALITY:: ADEQUATE

## 2023-12-06 DIAGNOSIS — J449 Chronic obstructive pulmonary disease, unspecified: Secondary | ICD-10-CM | POA: Diagnosis not present

## 2023-12-19 DIAGNOSIS — Z1211 Encounter for screening for malignant neoplasm of colon: Secondary | ICD-10-CM | POA: Diagnosis not present

## 2023-12-26 ENCOUNTER — Telehealth: Payer: Self-pay

## 2023-12-26 ENCOUNTER — Encounter: Payer: Self-pay | Admitting: Family Medicine

## 2023-12-26 LAB — COLOGUARD: COLOGUARD: NEGATIVE

## 2023-12-26 NOTE — Telephone Encounter (Signed)
 Spoke with pt notifying her we received Ozempic  shipment. Pt verbalizes understanding and will be by to pick up shortly.

## 2023-12-26 NOTE — Telephone Encounter (Addendum)
 Received pt's med shipment of Ozempic  2 mg (4 boxes total).   Attempted to contact pt. No answer, no vm. Need to notify pt of above med shipment.    [Placed Ozempic  (4 boxes total) in 2nd refrigerator].

## 2023-12-26 NOTE — Telephone Encounter (Signed)
 Patient picked up medication

## 2024-01-05 DIAGNOSIS — J449 Chronic obstructive pulmonary disease, unspecified: Secondary | ICD-10-CM | POA: Diagnosis not present

## 2024-01-17 ENCOUNTER — Encounter

## 2024-01-28 ENCOUNTER — Ambulatory Visit
Admission: RE | Admit: 2024-01-28 | Discharge: 2024-01-28 | Disposition: A | Discharge status: Home / Self Care | Source: Ambulatory Visit | Attending: Pulmonary Disease | Admitting: Pulmonary Disease

## 2024-01-28 DIAGNOSIS — Z87891 Personal history of nicotine dependence: Secondary | ICD-10-CM

## 2024-01-28 DIAGNOSIS — Z122 Encounter for screening for malignant neoplasm of respiratory organs: Secondary | ICD-10-CM

## 2024-02-05 DIAGNOSIS — J449 Chronic obstructive pulmonary disease, unspecified: Secondary | ICD-10-CM | POA: Diagnosis not present

## 2024-02-11 ENCOUNTER — Other Ambulatory Visit: Payer: Self-pay

## 2024-02-11 DIAGNOSIS — F17210 Nicotine dependence, cigarettes, uncomplicated: Secondary | ICD-10-CM

## 2024-02-11 DIAGNOSIS — Z87891 Personal history of nicotine dependence: Secondary | ICD-10-CM

## 2024-02-11 DIAGNOSIS — Z122 Encounter for screening for malignant neoplasm of respiratory organs: Secondary | ICD-10-CM

## 2024-02-12 ENCOUNTER — Ambulatory Visit (INDEPENDENT_AMBULATORY_CARE_PROVIDER_SITE_OTHER)

## 2024-02-12 ENCOUNTER — Telehealth: Payer: Self-pay | Admitting: Family Medicine

## 2024-02-12 VITALS — BP 112/64 | Ht 60.05 in | Wt 205.0 lb

## 2024-02-12 DIAGNOSIS — Z0000 Encounter for general adult medical examination without abnormal findings: Secondary | ICD-10-CM

## 2024-02-12 NOTE — Patient Instructions (Signed)
 [name] , Thank you for taking time out of your busy schedule to complete your Annual Wellness Visit with me. I enjoyed our conversation and look forward to speaking with you again next year. I, as well as your care team,  appreciate your ongoing commitment to your health goals. Please review the following plan we discussed and let me know if I can assist you in the future. Your Game plan/ To Do List    Referrals: If you haven't heard from the office you've been referred to, please reach out to them at the phone provided.  none Follow up Visits: Next Medicare AWV with our clinical staff: 02/12/2025   Have you seen your provider in the last 6 months (3 months if uncontrolled diabetes)? No Next Office Visit with your provider: 05/26/2024  Clinician Recommendations:  Aim for 30 minutes of exercise or brisk walking, 6-8 glasses of water , and 5 servings of fruits and vegetables each day.       This is a list of the screening recommended for you and due dates:  Health Maintenance  Topic Date Due   COVID-19 Vaccine (3 - Pfizer risk series) 07/26/2023   Eye exam for diabetics  02/16/2024   Complete foot exam   03/18/2024   Flu Shot  03/21/2024   Hemoglobin A1C  05/18/2024   Yearly kidney function blood test for diabetes  11/15/2024   Yearly kidney health urinalysis for diabetes  11/15/2024   Screening for Lung Cancer  01/27/2025   Medicare Annual Wellness Visit  02/11/2025   Mammogram  08/15/2025   Cologuard (Stool DNA test)  12/19/2026   DTaP/Tdap/Td vaccine (3 - Td or Tdap) 06/27/2033   Pneumococcal Vaccine for age over 20  Completed   DEXA scan (bone density measurement)  Completed   Hepatitis C Screening  Completed   Zoster (Shingles) Vaccine  Completed   Hepatitis B Vaccine  Aged Out   HPV Vaccine  Aged Out   Meningitis B Vaccine  Aged Out   Colon Cancer Screening  Discontinued    Advanced directives: (Copy Requested) Please bring a copy of your health care power of attorney  and living will to the office to be added to your chart at your convenience. You can mail to [facility] [address] [place], [place] [address] or email to [email or web address] Advance Care Planning is important because it:  [x]  Makes sure you receive the medical care that is consistent with your values, goals, and preferences  [x]  It provides guidance to your family and loved ones and reduces their decisional burden about whether or not they are making the right decisions based on your wishes.  Follow the link provided in your after visit summary or read over the paperwork we have mailed to you to help you started getting your Advance Directives in place. If you need assistance in completing these, please reach out to us  so that we can help you!  See attachments for Preventive Care and Fall Prevention Tips.

## 2024-02-12 NOTE — Telephone Encounter (Signed)
 Patient fell a week ago and her low back and still sore is asking if she can get xray she did not go to ER or UC. Fell off chair and effecting her sleep as well

## 2024-02-12 NOTE — Telephone Encounter (Signed)
 Spoke with pt notifying her she will need an OV but she will end up seeing another provider. Pt verbalizes understanding and accepted offer. Scheduled OV tomorrow with Dr Jimmy at 11:15. Pt expresses her thanks.

## 2024-02-12 NOTE — Progress Notes (Signed)
 Because this visit was a virtual/telehealth visit,  certain criteria was not obtained, such a blood pressure, CBG if applicable, and timed get up and go. Any medications not marked as taking were not mentioned during the medication reconciliation part of the visit. Any vitals not documented were not able to be obtained due to this being a telehealth visit or patient was unable to self-report a recent blood pressure reading due to a lack of equipment at home via telehealth. Vitals that have been documented are verbally provided by the patient.   This visit was performed by a medical professional under my direct supervision. I was immediately available for consultation/collaboration. I have reviewed and agree with the Annual Wellness Visit documentation.  Subjective:   [name] is a 73 y.o. who presents for a Medicare Wellness preventive visit.  As a reminder, Annual Wellness Visits don't include a physical exam, and some assessments may be limited, especially if this visit is performed virtually. We may recommend an in-person follow-up visit with your provider if needed.  Visit Complete: Virtual I connected with  [name] on 02/12/24 by a audio enabled telemedicine application and verified that I am speaking with the correct person using two identifiers.  Patient Location: Home  Provider Location: Home Office  I discussed the limitations of evaluation and management by telemedicine. The patient expressed understanding and agreed to proceed.  Vital Signs: Because this visit was a virtual/telehealth visit, some criteria may be missing or patient reported. Any vitals not documented were not able to be obtained and vitals that have been documented are patient reported.  VideoDeclined- This patient declined Librarian, academic. Therefore the visit was completed with audio only.  Persons Participating in Visit: Patient.  AWV Questionnaire: Yes: Patient  Medicare AWV questionnaire was completed by the patient on 02/08/2024; I have confirmed that all information answered by patient is correct and no changes since this date.  Cardiac Risk Factors include: obesity (BMI >30kg/m2);advanced age (>40men, >69 women);diabetes mellitus;hypertension     Objective:    Today's Vitals   02/12/24 1121  BP: 112/64  Weight: 205 lb (93 kg)  Height: 5' 0.05 (1.525 m)   Body mass index is 39.97 kg/m.     02/12/2024   11:20 AM 11/15/2022    2:31 PM 02/23/2022    8:09 AM 11/23/2020    1:13 PM 10/27/2020    9:56 AM 09/01/2020    3:49 PM 08/04/2020    8:00 PM  Advanced Directives  Does Patient Have a Medical Advance Directive? No No No No No No No  Type of Advance Directive       Healthcare Power of Attorney  Would patient like information on creating a medical advance directive? No - Patient declined No - Patient declined No - Patient declined No - Patient declined  No - Patient declined No - Patient declined    Current Medications (verified) Outpatient Encounter Medications as of 02/12/2024  Medication Sig   acetaminophen  (TYLENOL  8 HOUR ARTHRITIS PAIN) 650 MG CR tablet Take 650 mg by mouth every 8 (eight) hours as needed for pain.   albuterol  (VENTOLIN  HFA) 108 (90 Base) MCG/ACT inhaler Inhale 2 puffs into the lungs every 6 (six) hours as needed for wheezing or shortness of breath.   aspirin  81 MG EC tablet Take 1 tablet (81 mg total) by mouth daily.   aspirin -acetaminophen -caffeine  (EXCEDRIN MIGRAINE) 250-250-65 MG tablet Take by mouth every 6 (six) hours as needed for headache.  Blood Glucose Monitoring Suppl (ONETOUCH VERIO) w/Device KIT Use as instructed to check blood sugar 2 times a day   Boswellia-Glucosamine-Vit D (OSTEO BI-FLEX ONE PER DAY PO) Take 1 tablet by mouth daily.   Budeson-Glycopyrrol-Formoterol (BREZTRI  AEROSPHERE) 160-9-4.8 MCG/ACT AERO Inhale 2 puffs into the lungs in the morning and at bedtime.   Calcium  Carb-Cholecalciferol  (CALCIUM  + VITAMIN D3 PO) Take by mouth daily.   cyclobenzaprine  (FLEXERIL ) 10 MG tablet TAKE 1/2 TO 1 TABLET BY MOUTH TWICE A DAY AS NEEDED FOR MUSCLE SPASMS AND MIGRAINE   famotidine  (PEPCID ) 20 MG tablet Take 20 mg by mouth every other day. Alternates with omeprazole    gabapentin  (NEURONTIN ) 300 MG capsule Take 1 capsule (300 mg total) by mouth 2 (two) times daily.   glucose blood (ONETOUCH ULTRA TEST) test strip Use as instructed to check sugars twice daily E11.69   IBUPROFEN PO Take by mouth as needed.   Iron , Ferrous Sulfate , 325 (65 Fe) MG TABS Take 325 mg by mouth once a week.   isosorbide  mononitrate (IMDUR ) 60 MG 24 hr tablet TAKE 0.5 TABLETS (30 MG TOTAL) BY MOUTH 2 (TWO) TIMES DAILY.   Lancets (ONETOUCH DELICA PLUS LANCET33G) MISC Use as instructed to check blood sugar 2 times a day   levothyroxine  (SYNTHROID ) 100 MCG tablet Take 1 tablet (100 mcg total) by mouth daily before breakfast.   loratadine  (CLARITIN ) 10 MG tablet Take 1 tablet (10 mg total) by mouth daily.   metFORMIN  (GLUCOPHAGE ) 500 MG tablet Take 1 tablet (500 mg total) by mouth daily with breakfast.   metoprolol  succinate (TOPROL -XL) 25 MG 24 hr tablet Take 0.5 tablets (12.5 mg total) by mouth daily.   Multiple Vitamin (MULTIVITAMIN WITH MINERALS) TABS tablet Take 1 tablet by mouth daily. One A Day for Women   mupirocin  ointment (BACTROBAN ) 2 % Apply 1 Application topically 2 (two) times daily.   omeprazole  (PRILOSEC) 20 MG capsule Take 1 capsule (20 mg total) by mouth every other day.   ONETOUCH VERIO test strip USE AS INSTRUCTED TO CHECK BLOOD SUGAR 2 TIMES A DAY   OXYGEN  Inhale 2 L into the lungs at bedtime.   ranolazine  (RANEXA ) 500 MG 12 hr tablet Take 1 tablet (500 mg total) by mouth 2 (two) times daily.   Rhubarb (ESTROVEN COMPLETE PO) Take 1 tablet by mouth daily.   Semaglutide , 2 MG/DOSE, (OZEMPIC , 2 MG/DOSE,) 8 MG/3ML SOPN INJECT 2 MG AS DIRECTED ONCE A WEEK.   simvastatin  (ZOCOR ) 40 MG tablet Take 1 tablet  (40 mg total) by mouth at bedtime.   torsemide  (DEMADEX ) 20 MG tablet Take 1.5 tablets (30 mg total) by mouth daily.   traMADol  (ULTRAM ) 50 MG tablet TAKE 1 TABLET (50 MG TOTAL) BY MOUTH TWICE A DAY AS NEEDED FOR MODERATE PAIN   Turmeric 500 MG CAPS Take 1 capsule by mouth 2 (two) times daily as needed.   vitamin B-12 (CYANOCOBALAMIN ) 500 MCG tablet Take 1 tablet (500 mcg total) by mouth 2 (two) times a week.   ziprasidone  (GEODON ) 40 MG capsule Take 1 capsule (40 mg total) by mouth 2 (two) times daily with a meal.   mometasone  (ELOCON ) 0.1 % cream Apply 1 Application topically daily as needed (Rash).   Facility-Administered Encounter Medications as of 02/12/2024  Medication   albuterol  (PROVENTIL ) (2.5 MG/3ML) 0.083% nebulizer solution 2.5 mg    Allergies (verified) Crestor [rosuvastatin], Lipitor [atorvastatin ], Lithium, and Quetiapine   History: Past Medical History:  Diagnosis Date   Anxiety  Arthritis    per patient all over body   Bipolar disorder [facility])    has stopped seeing psychiatrist   CAD (coronary artery disease)    a. 06/2020 MV: small, sev, apical inf/apical defect-scar vs ischemia. EF 45%; b. 06/2020 Cath: LM 59m, LAD 70ost, 67m, LCX nl, RCA nl; c. 07/2020 CABG x 1: LIMA->LAD; d. 03/2022 MV: Significantly degraded imaging due to breast attenuation/obesity/extracardiac activity. No obvious isch/scar. EF 30-35% (EF 50-55% w/o rwma on f/u echo).   Cataract    Chronic bronchitis    COPD (chronic obstructive pulmonary disease) (HCC)    Depression    Dyspnea 2012   s/p pulm/cards w/u WNL, thought due to obesity/deconditioning   Fibromyalgia    GERD (gastroesophageal reflux disease)    HFmrEF (heart failure with mid-range ejection fraction) (HCC)    a. 05/2020 Echo: EF 40%, glob HK, mild LVH, Gr1 DD; b. 06/2020 EF by SPECT: 45%; c. 07/2020 intraop TEE: EF 50%; d. 03/2022 Echo: EF 50-55%, no rwma, GrI DD, mild LVH, nl RV fxn, mild MR.   History of chicken pox    HLD  (hyperlipidemia)    Hypertension    Hypothyroidism    Ischemic cardiomyopathy    a. 05/2020 Echo: EF 40%; b. 06/2020 EF by SPECT: 45%; c. 07/2020 intraop TEE: EF 50%.   Migraine    OSA (obstructive sleep apnea) 03/16/2011   Pneumonia    Sleep apnea    per patient cannot tolerate CPAP   T2DM (type 2 diabetes mellitus) (HCC) 2012   DM education 06/2011   Urinary incontinence    Past Surgical History:  Procedure Laterality Date   BREAST CYST ASPIRATION [name] 2003   cardiac catherization  03/2011   x3 Linda), mild nonobstructive CAD   COLONOSCOPY  12/2011   hyperplastic polyp x2,. diverticulosis, rec rpt 10 yrs   CORONARY ARTERY BYPASS GRAFT N/A 08/03/2020   Procedure: CORONARY ARTERY BYPASS GRAFTING TIMES ONE USING LEFT INTERNAL MAMMARY ARTERY.;  Surgeon: Lucas Dorise POUR, MD;  Location: [facility] OR;  Service: Open Heart Surgery;  Laterality: N/A;   ESOPHAGOGASTRODUODENOSCOPY  2006   ESOPHAGOGASTRODUODENOSCOPY  05/2020   no esophageal abnormality to explain dysphagia - esophagus dilated. Erosive gastropathy - neg H pylori [facility])   KNEE SURGERY  2006   left, torn meniscus   [name]/LEFT HEART CATH AND CORONARY ANGIOGRAPHY N/A 07/13/2020   Procedure: [name]/LEFT HEART CATH AND CORONARY ANGIOGRAPHY;  Surgeon: Mady Bruckner, MD;  Location: [facility] INVASIVE CV LAB;  Service: Cardiovascular;  Laterality: N/A;   TEE WITHOUT CARDIOVERSION N/A 08/03/2020   Procedure: TRANSESOPHAGEAL ECHOCARDIOGRAM (TEE);  Surgeon: Lucas Dorise POUR, MD;  Location: [facility] OR;  Service: Open Heart Surgery;  Laterality: N/A;   TOTAL ABDOMINAL HYSTERECTOMY  2004   fibroids, cervix remained   TOTAL KNEE ARTHROPLASTY Left 08/2012   [place] [facility]   Family History  Problem Relation Age of Onset   Asthma Maternal Uncle    Cancer Maternal Uncle        colon   Aneurysm Maternal Uncle    Colon cancer Maternal Uncle 10   Hypertension Mother    Stroke Mother    Arthritis Mother    COPD Sister    Acute lymphoblastic  leukemia Cousin 3       died at age 41   Cancer Father 43       brain tumor   Diabetes Father    Thyroid  disease Father    Cancer Maternal Aunt 79  breast   Breast cancer Maternal Aunt    Cancer Paternal Uncle        prostate   Coronary artery disease Neg Hx    Stomach cancer Neg Hx    Rectal cancer Neg Hx    Esophageal cancer Neg Hx    Social History   Socioeconomic History   Marital status: Married    Spouse name: Not on file   Number of children: Not on file   Years of education: Not on file   Highest education level: 12th grade  Occupational History   Occupation: disabled    Employer: OTHER  Tobacco Use   Smoking status: Former    Current packs/day: 0.00    Average packs/day: 2.0 packs/day for 43.0 years (86.0 ttl pk-yrs)    Types: Cigarettes    Start date: 05/18/1967    Quit date: 05/17/2010    Years since quitting: 13.7   Smokeless tobacco: Never   Tobacco comments:    QUIT 2011  Vaping Use   Vaping status: Never Used  Substance and Sexual Activity   Alcohol use: No    Alcohol/week: 0.0 standard drinks of alcohol   Drug use: No   Sexual activity: Yes    Partners: Male  Other Topics Concern   Not on file  Social History Narrative   Caffeine : 2-3 coffee/am, 1 cup tea in afternoon; Lives with husband, 1 dog, 2 cats; Occupation: disability since 2009 for bipolar, previously worked at [employer]; Diet: not many fruits, good vegetables, good amt water .      Smoker 43 years; quit in 2011. No alcohol. Lives in [place]; with husband.    Social Drivers of Corporate investment banker Strain: Low Risk  (02/08/2024)   Overall Financial Resource Strain (CARDIA)    Difficulty of Paying Living Expenses: Not hard at all  Food Insecurity: No Food Insecurity (02/08/2024)   Hunger Vital Sign    Worried About Running Out of Food in the Last Year: Never true    Ran Out of Food in the Last Year: Never true  Transportation Needs: No Transportation Needs (02/08/2024)    PRAPARE - Administrator, Civil Service (Medical): No    Lack of Transportation (Non-Medical): No  Physical Activity: Insufficiently Active (02/08/2024)   Exercise Vital Sign    Days of Exercise per Week: 2 days    Minutes of Exercise per Session: 20 min  Stress: No Stress Concern Present (02/08/2024)   [employer] of Occupational Health - Occupational Stress Questionnaire    Feeling of Stress: Not at all  Social Connections: Moderately Isolated (02/08/2024)   Social Connection and Isolation Panel    Frequency of Communication with Friends and Family: More than three times a week    Frequency of Social Gatherings with Friends and Family: Three times a week    Attends Religious Services: Never    Active Member of Clubs or Organizations: No    Attends Banker Meetings: Never    Marital Status: Married    Tobacco Counseling Counseling given: Not Answered Tobacco comments: QUIT 2011    Clinical Intake:  Pre-visit preparation completed: Yes  Pain : No/denies pain     BMI - recorded: 39.97 Nutritional Status: BMI > 30  Obese Nutritional Risks: None Diabetes: Yes CBG done?: No Did pt. bring in CBG monitor from home?: No  Lab Results  Component Value Date   HGBA1C 6.0 11/16/2023   HGBA1C 6.0 (A) 07/24/2023   HGBA1C  6.2 (A) 03/19/2023     How often do you need to have someone help you when you read instructions, pamphlets, or other written materials from your doctor or pharmacy?: 1 - Never     Information entered by :: Jessica Anderson LATHER   Activities of Daily Living     02/08/2024    2:28 PM  In your present state of health, do you have any difficulty performing the following activities:  Hearing? 0  Vision? 0  Difficulty concentrating or making decisions? 0  Walking or climbing stairs? 1  Dressing or bathing? 0  Doing errands, shopping? 0  Preparing Food and eating ? N  Using the Toilet? N  In the past six months, have you  accidently leaked urine? N  Do you have problems with loss of bowel control? N  Managing your Medications? N  Managing your Finances? N  Housekeeping or managing your Housekeeping? N    Patient Care Team: Rilla Baller, MD as PCP - General (Family Medicine) Darliss Rogue, MD as PCP - Cardiology (Cardiology) Lucio Franky PARAS, OD as Consulting Physician (Optometry) Tamea Dedra CROME, MD as Consulting Physician (Pulmonary Disease)  I have updated your Care Teams any recent Medical Services you may have received from other providers in the past year.     Assessment:   This is a routine wellness examination for [name].  Hearing/Vision screen Hearing Screening - Comments:: Patient declined hearing difficulties  Vision Screening - Comments:: Patient wears glasses    Goals Addressed             This Visit's Progress    Increase water  intake   On track    Starting 04/15/2018, I will continue to drink at least 6-8 glasses of water  daily.        Depression Screen     02/12/2024   11:26 AM 11/15/2022    3:39 PM 11/15/2022    2:30 PM 11/11/2021   10:42 AM 11/23/2020    1:16 PM 10/11/2020    4:17 PM 05/26/2020   11:21 AM  PHQ 2/9 Scores  PHQ - 2 Score 0 0 0 0 0 1 0  PHQ- 9 Score 0   1 0 5 0    Fall Risk     02/08/2024    2:28 PM 11/15/2022    3:39 PM 11/15/2022    2:32 PM 11/11/2021    9:40 AM 11/23/2020    1:14 PM  Fall Risk   Falls in the past year? 1 0 0 0 1  Number falls in past yr: 0  0  0  Injury with Fall? 1  0  0  Risk for fall due to : History of fall(s)  No Fall Risks  Medication side effect  Follow up Falls evaluation completed;Education provided  Falls prevention discussed;Falls evaluation completed  Falls evaluation completed;Falls prevention discussed      Data saved with a previous flowsheet row definition    MEDICARE RISK AT HOME:  Medicare Risk at Home Any stairs in or around the home?: (Patient-Rptd) No If so, are there any without handrails?:  (Patient-Rptd) No Home free of loose throw rugs in walkways, pet beds, electrical cords, etc?: (Patient-Rptd) No Adequate lighting in your home to reduce risk of falls?: (Patient-Rptd) Yes Life alert?: (Patient-Rptd) No Use of a cane, walker or w/c?: (Patient-Rptd) Yes Grab bars in the bathroom?: (Patient-Rptd) Yes Shower chair or bench in shower?: (Patient-Rptd) Yes Elevated toilet seat or a handicapped toilet?: (Patient-Rptd) Yes  TIMED UP AND GO:  Was the test performed?  No  Cognitive Function: 6CIT completed    11/23/2020    1:20 PM 04/15/2018   12:55 PM 04/10/2017    8:58 AM  MMSE - Mini Mental State Exam  Orientation to time 5 5 5    Orientation to Place 5 5 5    Registration 3 3 3    Attention/ Calculation 5 0 0   Recall 3 3 1    Recall-comments   pt was unable to recall 1 of 3 words   Language- name 2 objects  0 0   Language- repeat 1 1 1   Language- follow 3 step command  3 3   Language- read & follow direction  0 0   Write a sentence  0 0   Copy design  0 0   Total score  20 18      Data saved with a previous flowsheet row definition        02/12/2024   11:23 AM 11/15/2022    2:35 PM  6CIT Screen  What Year? 0 points 0 points  What month? 0 points 0 points  What time? 0 points 0 points  Count back from 20 0 points 0 points  Months in reverse 0 points 0 points  Repeat phrase 0 points 4 points  Total Score 0 points 4 points    Immunizations Immunization History  Administered Date(s) Administered   Fluad Quad(high Dose 65+) 04/24/2019, 05/26/2020, 05/16/2022, 06/28/2023   Influenza Split 05/09/2011, 06/11/2012   Influenza Whole 05/20/2010   Influenza, High Dose Seasonal PF 06/01/2021, 06/28/2023   Influenza,inj,Quad PF,6+ Mos 07/14/2013, 06/16/2014, 04/30/2015, 05/31/2016, 05/10/2017, 04/19/2018   Moderna Covid-19 Vaccine  Bivalent Booster 61yrs & up 06/24/2021   Moderna SARS-COV2 Booster Vaccination 06/21/2020, 12/31/2020   Moderna Sars-Covid-2 Vaccination  10/18/2019, 11/15/2019   PFIZER Comirnaty(Gray Top)Covid-19 Tri-Sucrose Vaccine 06/28/2023   PNEUMOCOCCAL CONJUGATE-20 06/28/2023   Pfizer(Comirnaty)Fall Seasonal Vaccine 12 years and older 07/15/2022, 06/28/2023   Pneumococcal Conjugate-13 11/14/2017, 04/19/2018   Pneumococcal Polysaccharide-23 04/18/2011, 05/31/2021   Respiratory Syncytial Virus Vaccine,Recomb Aduvanted(Arexvy) 07/15/2022   Td 04/18/2011   Tdap 06/28/2023   Zoster Recombinant(Shingrix) 11/14/2017, 01/02/2018   Zoster, Live 12/29/2013    Screening Tests Health Maintenance  Topic Date Due   COVID-19 Vaccine (3 - Pfizer risk series) 07/26/2023   OPHTHALMOLOGY EXAM  02/16/2024   FOOT EXAM  03/18/2024   INFLUENZA VACCINE  03/21/2024   HEMOGLOBIN A1C  05/18/2024   Diabetic kidney evaluation - eGFR measurement  11/15/2024   Diabetic kidney evaluation - Urine ACR  11/15/2024   Lung Cancer Screening  01/27/2025   Medicare Annual Wellness (AWV)  02/11/2025   MAMMOGRAM  08/15/2025   Fecal DNA (Cologuard)  12/19/2026   DTaP/Tdap/Td (3 - Td or Tdap) 06/27/2033   Pneumococcal Vaccine: 50+ Years  Completed   DEXA SCAN  Completed   Hepatitis C Screening  Completed   Zoster Vaccines- Shingrix  Completed   Hepatitis B Vaccines  Aged Out   HPV VACCINES  Aged Out   Meningococcal B Vaccine  Aged Out   Colonoscopy  Discontinued    Health Maintenance  Health Maintenance Due  Topic Date Due   COVID-19 Vaccine (3 - Pfizer risk series) 07/26/2023   Health Maintenance Items Addressed:   Additional Screening:  Vision Screening: Recommended annual ophthalmology exams for early detection of glaucoma and other disorders of the eye. Would you like a referral to an eye doctor? No    Dental Screening: Recommended annual  dental exams for proper oral hygiene  Community Resource Referral / Chronic Care Management: CRR required this visit?  No   CCM required this visit?  No   Plan:    I have personally reviewed and noted  the following in the patient's chart:   Medical and social history Use of alcohol, tobacco or illicit drugs  Current medications and supplements including opioid prescriptions. Patient is not currently taking opioid prescriptions. Functional ability and status Nutritional status Physical activity Advanced directives List of other physicians Hospitalizations, surgeries, and ER visits in previous 12 months Vitals Screenings to include cognitive, depression, and falls Referrals and appointments  In addition, I have reviewed and discussed with patient certain preventive protocols, quality metrics, and best practice recommendations. A written personalized care plan for preventive services as well as general preventive health recommendations were provided to patient.   [name], [name]   02/12/2024   After Visit Summary: (MyChart) Due to this being a telephonic visit, the after visit summary with patients personalized plan was offered to patient via MyChart   Notes: Nothing significant to report at this time.

## 2024-02-13 ENCOUNTER — Ambulatory Visit (INDEPENDENT_AMBULATORY_CARE_PROVIDER_SITE_OTHER)
Admission: RE | Admit: 2024-02-13 | Discharge: 2024-02-13 | Disposition: A | Discharge status: Home / Self Care | Source: Ambulatory Visit | Attending: Internal Medicine | Admitting: Internal Medicine

## 2024-02-13 ENCOUNTER — Ambulatory Visit: Admitting: Internal Medicine

## 2024-02-13 ENCOUNTER — Ambulatory Visit (INDEPENDENT_AMBULATORY_CARE_PROVIDER_SITE_OTHER): Admitting: Internal Medicine

## 2024-02-13 ENCOUNTER — Encounter: Payer: Self-pay | Admitting: Internal Medicine

## 2024-02-13 VITALS — BP 100/60 | HR 76 | Temp 98.2°F | Ht 60.5 in | Wt 206.0 lb

## 2024-02-13 DIAGNOSIS — M533 Sacrococcygeal disorders, not elsewhere classified: Secondary | ICD-10-CM | POA: Insufficient documentation

## 2024-02-13 DIAGNOSIS — M47816 Spondylosis without myelopathy or radiculopathy, lumbar region: Secondary | ICD-10-CM | POA: Diagnosis not present

## 2024-02-13 DIAGNOSIS — M545 Low back pain, unspecified: Secondary | ICD-10-CM | POA: Diagnosis not present

## 2024-02-13 DIAGNOSIS — I700 Atherosclerosis of aorta: Secondary | ICD-10-CM | POA: Diagnosis not present

## 2024-02-13 MED ORDER — TRAMADOL HCL 50 MG PO TABS: ORAL_TABLET | ORAL | 0 refills | Status: DC

## 2024-02-13 NOTE — Progress Notes (Signed)
 Subjective:    Patient ID: [name], female    DOB: June 22, 1951, 73 y.o.   MRN: [id number]  HPI Here due to ongoing pain after a fall last week  Was on a computer chair with wheels---moved it and snagged on the floor Chair went up --she went with it--then chair moved away and she fell right on buttocks and left arm This happened 6/14 Need sons to help her up  Still in agony over the tailbone and up the spine Hard to sit Trouble walking at baseline (uses rollator)---no worse when walking  Takes tylenol  arthritis Has used some cyclobenzaprine  at bedtime---and is able to sleep on left side Does use regular ibuprofen--not clearly helping  Here today since pain is still so bad--actually worsening  Current Outpatient Medications on File Prior to Visit  Medication Sig Dispense Refill   acetaminophen  (TYLENOL  8 HOUR ARTHRITIS PAIN) 650 MG CR tablet Take 650 mg by mouth every 8 (eight) hours as needed for pain.     albuterol  (VENTOLIN  HFA) 108 (90 Base) MCG/ACT inhaler Inhale 2 puffs into the lungs every 6 (six) hours as needed for wheezing or shortness of breath. 18 g 3   aspirin  81 MG EC tablet Take 1 tablet (81 mg total) by mouth daily.     aspirin -acetaminophen -caffeine  (EXCEDRIN MIGRAINE) 250-250-65 MG tablet Take by mouth every 6 (six) hours as needed for headache.     Blood Glucose Monitoring Suppl (ONETOUCH VERIO) w/Device KIT Use as instructed to check blood sugar 2 times a day 1 kit 0   Boswellia-Glucosamine-Vit D (OSTEO BI-FLEX ONE PER DAY PO) Take 1 tablet by mouth daily.     Budeson-Glycopyrrol-Formoterol (BREZTRI  AEROSPHERE) 160-9-4.8 MCG/ACT AERO Inhale 2 puffs into the lungs in the morning and at bedtime. 3 each 3   Calcium  Carb-Cholecalciferol (CALCIUM  + VITAMIN D3 PO) Take by mouth daily.     cyclobenzaprine  (FLEXERIL ) 10 MG tablet TAKE 1/2 TO 1 TABLET BY MOUTH TWICE A DAY AS NEEDED FOR MUSCLE SPASMS AND MIGRAINE 30 tablet 3   famotidine  (PEPCID ) 20 MG tablet Take  20 mg by mouth every other day. Alternates with omeprazole      gabapentin  (NEURONTIN ) 300 MG capsule Take 1 capsule (300 mg total) by mouth 2 (two) times daily. 180 capsule 1   glucose blood (ONETOUCH ULTRA TEST) test strip Use as instructed to check sugars twice daily E11.69 100 each 12   IBUPROFEN PO Take by mouth as needed.     Iron , Ferrous Sulfate , 325 (65 Fe) MG TABS Take 325 mg by mouth once a week.     isosorbide  mononitrate (IMDUR ) 60 MG 24 hr tablet TAKE 0.5 TABLETS (30 MG TOTAL) BY MOUTH 2 (TWO) TIMES DAILY. 90 tablet 3   Lancets (ONETOUCH DELICA PLUS LANCET33G) MISC Use as instructed to check blood sugar 2 times a day 200 each 3   levothyroxine  (SYNTHROID ) 100 MCG tablet Take 1 tablet (100 mcg total) by mouth daily before breakfast. 90 tablet 4   loratadine  (CLARITIN ) 10 MG tablet Take 1 tablet (10 mg total) by mouth daily.     metFORMIN  (GLUCOPHAGE ) 500 MG tablet Take 1 tablet (500 mg total) by mouth daily with breakfast. 90 tablet 4   metoprolol  succinate (TOPROL -XL) 25 MG 24 hr tablet Take 0.5 tablets (12.5 mg total) by mouth daily. 45 tablet 4   Multiple Vitamin (MULTIVITAMIN WITH MINERALS) TABS tablet Take 1 tablet by mouth daily. One A Day for Women     mupirocin  ointment (  BACTROBAN ) 2 % Apply 1 Application topically 2 (two) times daily. 15 g 3   omeprazole  (PRILOSEC) 20 MG capsule Take 1 capsule (20 mg total) by mouth every other day. 45 capsule 4   ONETOUCH VERIO test strip USE AS INSTRUCTED TO CHECK BLOOD SUGAR 2 TIMES A DAY 200 strip 3   OXYGEN  Inhale 2 L into the lungs at bedtime.     ranolazine  (RANEXA ) 500 MG 12 hr tablet Take 1 tablet (500 mg total) by mouth 2 (two) times daily. 180 tablet 3   Rhubarb (ESTROVEN COMPLETE PO) Take 1 tablet by mouth daily.     Semaglutide , 2 MG/DOSE, (OZEMPIC , 2 MG/DOSE,) 8 MG/3ML SOPN INJECT 2 MG AS DIRECTED ONCE A WEEK. 3 mL 6   simvastatin  (ZOCOR ) 40 MG tablet Take 1 tablet (40 mg total) by mouth at bedtime. 90 tablet 4   torsemide   (DEMADEX ) 20 MG tablet Take 1.5 tablets (30 mg total) by mouth daily. 135 tablet 3   traMADol  (ULTRAM ) 50 MG tablet TAKE 1 TABLET (50 MG TOTAL) BY MOUTH TWICE A DAY AS NEEDED FOR MODERATE PAIN 30 tablet 0   Turmeric 500 MG CAPS Take 1 capsule by mouth 2 (two) times daily as needed.     vitamin B-12 (CYANOCOBALAMIN ) 500 MCG tablet Take 1 tablet (500 mcg total) by mouth 2 (two) times a week.     ziprasidone  (GEODON ) 40 MG capsule Take 1 capsule (40 mg total) by mouth 2 (two) times daily with a meal. 180 capsule 4   mometasone  (ELOCON ) 0.1 % cream Apply 1 Application topically daily as needed (Rash). 45 g 1   Current Facility-Administered Medications on File Prior to Visit  Medication Dose Route Frequency Provider Last Rate Last Admin   albuterol  (PROVENTIL ) (2.5 MG/3ML) 0.083% nebulizer solution 2.5 mg  2.5 mg Nebulization Once Tamea Dedra CROME, MD        Allergies  Allergen Reactions   Crestor [Rosuvastatin] Other (See Comments)    myalgias   Lipitor [Atorvastatin ] Other (See Comments)    Mood swings   Lithium Other (See Comments)    abd pain, n/v, was hospitalized   Quetiapine Other (See Comments)    Muscle twitching, muscle spasm    Past Medical History:  Diagnosis Date   Anxiety    Arthritis    per patient all over body   Bipolar disorder (HCC)    has stopped seeing psychiatrist   CAD (coronary artery disease)    a. 06/2020 MV: small, sev, apical inf/apical defect-scar vs ischemia. EF 45%; b. 06/2020 Cath: LM 45m, LAD 70ost, 49m, LCX nl, RCA nl; c. 07/2020 CABG x 1: LIMA->LAD; d. 03/2022 MV: Significantly degraded imaging due to breast attenuation/obesity/extracardiac activity. No obvious isch/scar. EF 30-35% (EF 50-55% w/o rwma on f/u echo).   Cataract    Chronic bronchitis    COPD (chronic obstructive pulmonary disease) (HCC)    Depression    Dyspnea 2012   s/p pulm/cards w/u WNL, thought due to obesity/deconditioning   Fibromyalgia    GERD (gastroesophageal reflux  disease)    HFmrEF (heart failure with mid-range ejection fraction) (HCC)    a. 05/2020 Echo: EF 40%, glob HK, mild LVH, Gr1 DD; b. 06/2020 EF by SPECT: 45%; c. 07/2020 intraop TEE: EF 50%; d. 03/2022 Echo: EF 50-55%, no rwma, GrI DD, mild LVH, nl RV fxn, mild MR.   History of chicken pox    HLD (hyperlipidemia)    Hypertension    Hypothyroidism  Ischemic cardiomyopathy    a. 05/2020 Echo: EF 40%; b. 06/2020 EF by SPECT: 45%; c. 07/2020 intraop TEE: EF 50%.   Migraine    OSA (obstructive sleep apnea) 03/16/2011   Pneumonia    Sleep apnea    per patient cannot tolerate CPAP   T2DM (type 2 diabetes mellitus) (HCC) 2012   DM education 06/2011   Urinary incontinence     Past Surgical History:  Procedure Laterality Date   BREAST CYST ASPIRATION Right 2003   cardiac catherization  03/2011   x3 Linda), mild nonobstructive CAD   COLONOSCOPY  12/2011   hyperplastic polyp x2,. diverticulosis, rec rpt 10 yrs   CORONARY ARTERY BYPASS GRAFT N/A 08/03/2020   Procedure: CORONARY ARTERY BYPASS GRAFTING TIMES ONE USING LEFT INTERNAL MAMMARY ARTERY.;  Surgeon: Lucas Dorise POUR, MD;  Location: [facility] OR;  Service: Open Heart Surgery;  Laterality: N/A;   ESOPHAGOGASTRODUODENOSCOPY  2006   ESOPHAGOGASTRODUODENOSCOPY  05/2020   no esophageal abnormality to explain dysphagia - esophagus dilated. Erosive gastropathy - neg H pylori Oma)   KNEE SURGERY  2006   left, torn meniscus   RIGHT/LEFT HEART CATH AND CORONARY ANGIOGRAPHY N/A 07/13/2020   Procedure: RIGHT/LEFT HEART CATH AND CORONARY ANGIOGRAPHY;  Surgeon: Mady Bruckner, MD;  Location: [facility] INVASIVE CV LAB;  Service: Cardiovascular;  Laterality: N/A;   TEE WITHOUT CARDIOVERSION N/A 08/03/2020   Procedure: TRANSESOPHAGEAL ECHOCARDIOGRAM (TEE);  Surgeon: Lucas Dorise POUR, MD;  Location: [facility] OR;  Service: Open Heart Surgery;  Laterality: N/A;   TOTAL ABDOMINAL HYSTERECTOMY  2004   fibroids, cervix remained   TOTAL KNEE ARTHROPLASTY Left 08/2012    [facility]    Family History  Problem Relation Age of Onset   Asthma Maternal Uncle    Cancer Maternal Uncle        colon   Aneurysm Maternal Uncle    Colon cancer Maternal Uncle 35   Hypertension Mother    Stroke Mother    Arthritis Mother    COPD Sister    Acute lymphoblastic leukemia Cousin 3       died at age 57   Cancer Father 85       brain tumor   Diabetes Father    Thyroid  disease Father    Cancer Maternal Aunt 31       breast   Breast cancer Maternal Aunt    Cancer Paternal Uncle        prostate   Coronary artery disease Neg Hx    Stomach cancer Neg Hx    Rectal cancer Neg Hx    Esophageal cancer Neg Hx     Social History   Socioeconomic History   Marital status: Married    Spouse name: Not on file   Number of children: Not on file   Years of education: Not on file   Highest education level: 12th grade  Occupational History   Occupation: disabled    Employer: OTHER  Tobacco Use   Smoking status: Former    Current packs/day: 0.00    Average packs/day: 2.0 packs/day for 43.0 years (86.0 ttl pk-yrs)    Types: Cigarettes    Start date: 05/18/1967    Quit date: 05/17/2010    Years since quitting: 13.7   Smokeless tobacco: Never   Tobacco comments:    QUIT 2011  Vaping Use   Vaping status: Never Used  Substance and Sexual Activity   Alcohol use: No    Alcohol/week: 0.0 standard drinks of alcohol  Drug use: No   Sexual activity: Yes    Partners: Male  Other Topics Concern   Not on file  Social History Narrative   Caffeine : 2-3 coffee/am, 1 cup tea in afternoon; Lives with husband, 1 dog, 2 cats; Occupation: disability since 2009 for bipolar, previously worked at [employer]; Diet: not many fruits, good vegetables, good amt water .      Smoker 43 years; quit in 2011. No alcohol. Lives in [place]; with husband.    Social Drivers of Corporate investment banker Strain: Low Risk  (02/08/2024)   Overall Financial Resource Strain (CARDIA)     Difficulty of Paying Living Expenses: Not hard at all  Food Insecurity: No Food Insecurity (02/08/2024)   Hunger Vital Sign    Worried About Running Out of Food in the Last Year: Never true    Ran Out of Food in the Last Year: Never true  Transportation Needs: No Transportation Needs (02/08/2024)   PRAPARE - Administrator, Civil Service (Medical): No    Lack of Transportation (Non-Medical): No  Physical Activity: Insufficiently Active (02/08/2024)   Exercise Vital Sign    Days of Exercise per Week: 2 days    Minutes of Exercise per Session: 20 min  Stress: No Stress Concern Present (02/08/2024)   [employer] of Occupational Health - Occupational Stress Questionnaire    Feeling of Stress: Not at all  Social Connections: Moderately Isolated (02/08/2024)   Social Connection and Isolation Panel    Frequency of Communication with Friends and Family: More than three times a week    Frequency of Social Gatherings with Friends and Family: Three times a week    Attends Religious Services: Never    Active Member of Clubs or Organizations: No    Attends Banker Meetings: Never    Marital Status: Married  Catering manager Violence: Not At Risk (02/12/2024)   Humiliation, Afraid, Rape, and Kick questionnaire    Fear of Current or Ex-Partner: No    Emotionally Abused: No    Physically Abused: No    Sexually Abused: No   Review of Systems     Objective:   Physical Exam  Musculoskeletal:     Comments: Tenderness over lumbar spine and coccyx--but not severe  Is able to transfer in and to chair easily Gait is fairly normal for her (good pace with rollator--walks somewhat bent over)   Skin:    Comments: No buttock swelling or ecchymoses            Assessment & Plan:

## 2024-02-13 NOTE — Assessment & Plan Note (Signed)
 And mild lumbar tenderness after a fall 11 days ago The pain is not easing up---so will check x-ray Already on ibuprofen Will refill her tramadol  that has helped for other pain in the past

## 2024-02-17 ENCOUNTER — Other Ambulatory Visit: Payer: Self-pay | Admitting: Family Medicine

## 2024-02-17 DIAGNOSIS — M797 Fibromyalgia: Secondary | ICD-10-CM

## 2024-02-18 ENCOUNTER — Ambulatory Visit: Payer: Self-pay | Admitting: Internal Medicine

## 2024-02-18 DIAGNOSIS — E119 Type 2 diabetes mellitus without complications: Secondary | ICD-10-CM | POA: Diagnosis not present

## 2024-02-18 DIAGNOSIS — H40003 Preglaucoma, unspecified, bilateral: Secondary | ICD-10-CM | POA: Diagnosis not present

## 2024-02-18 DIAGNOSIS — H2513 Age-related nuclear cataract, bilateral: Secondary | ICD-10-CM | POA: Diagnosis not present

## 2024-02-18 DIAGNOSIS — H35371 Puckering of macula, right eye: Secondary | ICD-10-CM | POA: Diagnosis not present

## 2024-02-18 DIAGNOSIS — I714 Abdominal aortic aneurysm, without rupture, unspecified: Secondary | ICD-10-CM

## 2024-02-18 LAB — HM DIABETES EYE EXAM

## 2024-02-25 ENCOUNTER — Telehealth: Payer: Self-pay | Admitting: Family Medicine

## 2024-02-25 DIAGNOSIS — E1169 Type 2 diabetes mellitus with other specified complication: Secondary | ICD-10-CM

## 2024-02-25 NOTE — Telephone Encounter (Unsigned)
 Copied from CRM [id number]. Topic: Clinical - Medical Advice >> Feb 25, 2024  9:38 AM Henretta I wrote: Reason for CRM: Patient was trying to change battery on one touch tester and it broke. It will not turn on at all and patient would like to know if new one can be ordered.

## 2024-02-26 MED ORDER — ONETOUCH VERIO FLEX SYSTEM DEVI: 1.0000 | Freq: Once | 0 refills | Status: DC

## 2024-02-26 NOTE — Telephone Encounter (Signed)
 E-scribed new rx for [employer]

## 2024-03-05 ENCOUNTER — Ambulatory Visit
Admission: RE | Admit: 2024-03-05 | Discharge: 2024-03-05 | Disposition: A | Discharge status: Home / Self Care | Source: Ambulatory Visit | Attending: Family Medicine | Admitting: Family Medicine

## 2024-03-05 DIAGNOSIS — I714 Abdominal aortic aneurysm, without rupture, unspecified: Secondary | ICD-10-CM | POA: Insufficient documentation

## 2024-03-06 DIAGNOSIS — J449 Chronic obstructive pulmonary disease, unspecified: Secondary | ICD-10-CM | POA: Diagnosis not present

## 2024-03-08 ENCOUNTER — Ambulatory Visit: Payer: Self-pay | Admitting: Family Medicine

## 2024-03-08 DIAGNOSIS — I714 Abdominal aortic aneurysm, without rupture, unspecified: Secondary | ICD-10-CM

## 2024-04-06 DIAGNOSIS — J449 Chronic obstructive pulmonary disease, unspecified: Secondary | ICD-10-CM | POA: Diagnosis not present

## 2024-04-10 ENCOUNTER — Ambulatory Visit: Admitting: Pulmonary Disease

## 2024-04-10 ENCOUNTER — Encounter: Payer: Self-pay | Admitting: Pulmonary Disease

## 2024-04-10 VITALS — BP 140/70 | HR 81 | Temp 98.3°F | Ht 60.5 in | Wt 194.2 lb

## 2024-04-10 DIAGNOSIS — Z87891 Personal history of nicotine dependence: Secondary | ICD-10-CM

## 2024-04-10 DIAGNOSIS — J449 Chronic obstructive pulmonary disease, unspecified: Secondary | ICD-10-CM | POA: Diagnosis not present

## 2024-04-10 DIAGNOSIS — E669 Obesity, unspecified: Secondary | ICD-10-CM

## 2024-04-10 DIAGNOSIS — G4736 Sleep related hypoventilation in conditions classified elsewhere: Secondary | ICD-10-CM

## 2024-04-10 DIAGNOSIS — R5381 Other malaise: Secondary | ICD-10-CM

## 2024-04-10 DIAGNOSIS — Z951 Presence of aortocoronary bypass graft: Secondary | ICD-10-CM

## 2024-04-10 DIAGNOSIS — Z6838 Body mass index (BMI) 38.0-38.9, adult: Secondary | ICD-10-CM

## 2024-04-10 NOTE — Patient Instructions (Signed)
 VISIT SUMMARY:  Today, you were seen for a follow-up appointment regarding your shortness of breath, which is related to your COPD and coronary artery disease. You are currently managing well with your Breztri  inhaler and nighttime oxygen  use. Your recent CT scan and PET CT of the heart showed no significant changes, and you have a follow-up appointment with your cardiologist scheduled for October.  YOUR PLAN:  -CHRONIC OBSTRUCTIVE PULMONARY DISEASE (COPD) WITH EMPHYSEMA: COPD with emphysema is a chronic lung condition that makes it hard to breathe. Your condition is well-managed with the Breztri  inhaler, and you should continue using it as prescribed. You should also continue using oxygen  at night and occasionally during the day if needed. No new lung function tests are required until next year.  -CORONARY ARTERY DISEASE, POST-CABG: Coronary artery disease is a condition where the blood vessels supplying your heart are narrowed or blocked. You had surgery in the past to improve this condition, and it is currently well-managed. You should continue your follow-up with your cardiologist, with your next appointment scheduled for October.  -SHORTNESS OF BREATH: Your shortness of breath is likely due to your COPD and emphysema. It is being managed with your current inhaler and supplemental oxygen  use. Continue with your current treatment plan.  INSTRUCTIONS:  Please continue using your Breztri  inhaler as prescribed and your nighttime oxygen . Use your oxygen  during the day if you experience breathing issues. Follow up with your cardiologist in October as scheduled. No new lung function tests are needed until next year.

## 2024-04-10 NOTE — Progress Notes (Signed)
 Subjective:    Patient ID: [name], female    DOB: 1950/10/30, 73 y.o.   MRN: [id number]  Patient Care Team: Rilla Baller, MD as PCP - General (Family Medicine) Darliss Rogue, MD as PCP - Cardiology (Cardiology) Lucio Franky PARAS, OD as Consulting Physician (Optometry) Tamea Dedra CROME, MD as Consulting Physician (Pulmonary Disease)  Chief Complaint  Patient presents with   COPD    Shortness of breath on exertion and at rest. Patient reports using oxygen  at night and occasional during the day.     BACKGROUND/INTERVAL:[name] is a 73 year old former smoker with an 86-pack-year history of smoking who presents for follow-up on the issue of dyspnea, nocturnal hypoxemia and COPD.  She was last seen here on 21 March 2023.  She had been lost to follow-up since then.  HPI Discussed the use of AI scribe software for clinical note transcription with the patient, who gave verbal consent to proceed.  History of Present Illness   [name] is a 73 year old female with COPD and coronary artery disease who presents with shortness of breath.  She follows up for shortness of breath, with a history of COPD and coronary artery disease.  She is currently using Breztri  inhaler and is doing well with it. She continues to use oxygen  at night and occasionally during the afternoon if she experiences breathing issues.  Overall she feels that her shortness of breath is at baseline.  She has significant issues with obesity and deconditioning which affect her shortness of breath.  Her albuterol  inhaler was last filled in July, and she does not use it often.  She would like to make sure that all of her respiratory medication prescriptions are up-to-date.  She had heart surgery in the past and continues to follow up with her cardiologist, with her next appointment scheduled for October. Her last pulmonary function test was in July 2024.  No frequent use of albuterol  inhaler.  She does not  endorse any other symptomatology.  As noted, she feels her symptoms are stable.  She is compliant with oxygen  therapy and notes benefit of the same.  She is enrolled in lung cancer screening and no new abnormalities have been noted.      Review of Systems A 10 point review of systems was performed and it is as noted above otherwise negative.   Patient Active Problem List   Diagnosis Date Noted   Coccygodynia 02/13/2024   Ventral hernia 11/24/2023   Elevated vitamin B12 level 11/17/2022   Iron  deficiency 11/17/2022   Postural dizziness with presyncope 02/13/2022   Skin lesion of breast 11/13/2021   AAA (abdominal aortic aneurysm) (HCC) 10/09/2020   Back pain 09/27/2020   Gross hematuria 09/14/2020   S/P CABG x 1 08/03/2020   Abnormal cardiovascular stress test    Aortic atherosclerosis (HCC) 04/15/2020   Asthma 04/15/2020   Pill dysphagia 04/05/2020   Cervical neck pain with evidence of disc disease 10/23/2019   Osteoarthritis 10/23/2019   Anemia 10/23/2019   Hepatic steatosis 05/03/2019   Thrombocytosis 04/24/2019   Migraine 04/19/2018   Severe obesity (BMI 35.0-39.9) with comorbidity (HCC) 08/17/2017   Palpitations 01/08/2017   Neck swelling 07/10/2016   Neutrophilia 04/30/2015   Advanced care planning/counseling discussion 01/22/2015   Health maintenance examination 01/22/2015   Medicare annual wellness visit, subsequent 12/15/2013   Hearing loss 02/25/2013   Tachycardia 09/13/2012   Osteoarthritis of left knee 12/11/2011   Candidal intertrigo 05/10/2011   Hypertension  Bipolar disorder (HCC)    Fibromyalgia    Hyperlipidemia associated with type 2 diabetes mellitus (HCC)    GERD (gastroesophageal reflux disease)    Hypothyroidism 04/05/2011   OSA (obstructive sleep apnea) 03/16/2011   CAD (coronary artery disease) 03/16/2011   Type 2 diabetes mellitus with other specified complication (HCC) 03/15/2011    Social History   Tobacco Use   Smoking status: Former     Current packs/day: 0.00    Average packs/day: 2.0 packs/day for 43.0 years (86.0 ttl pk-yrs)    Types: Cigarettes    Start date: 05/18/1967    Quit date: 05/17/2010    Years since quitting: 13.9   Smokeless tobacco: Never   Tobacco comments:    QUIT 2011  Substance Use Topics   Alcohol use: No    Alcohol/week: 0.0 standard drinks of alcohol    Allergies  Allergen Reactions   Crestor [Rosuvastatin] Other (See Comments)    myalgias   Lipitor [Atorvastatin ] Other (See Comments)    Mood swings   Lithium Other (See Comments)    abd pain, n/v, was hospitalized   Quetiapine Other (See Comments)    Muscle twitching, muscle spasm    Current Meds  Medication Sig   acetaminophen  (TYLENOL  8 HOUR ARTHRITIS PAIN) 650 MG CR tablet Take 650 mg by mouth every 8 (eight) hours as needed for pain.   albuterol  (VENTOLIN  HFA) 108 (90 Base) MCG/ACT inhaler Inhale 2 puffs into the lungs every 6 (six) hours as needed for wheezing or shortness of breath.   aspirin  81 MG EC tablet Take 1 tablet (81 mg total) by mouth daily.   aspirin -acetaminophen -caffeine  (EXCEDRIN MIGRAINE) 250-250-65 MG tablet Take by mouth every 6 (six) hours as needed for headache.   Blood Glucose Monitoring Suppl (ONETOUCH VERIO FLEX SYSTEM) DEVI 1 kit by Does not apply route once for 1 dose. Use as instructed to check blood sugar once a day   Boswellia-Glucosamine-Vit D (OSTEO BI-FLEX ONE PER DAY PO) Take 1 tablet by mouth daily.   Budeson-Glycopyrrol-Formoterol (BREZTRI  AEROSPHERE) 160-9-4.8 MCG/ACT AERO Inhale 2 puffs into the lungs in the morning and at bedtime.   Calcium  Carb-Cholecalciferol (CALCIUM  + VITAMIN D3 PO) Take by mouth daily.   cyclobenzaprine  (FLEXERIL ) 10 MG tablet TAKE 1/2 TO 1 TABLET BY MOUTH TWICE A DAY AS NEEDED FOR MUSCLE SPASMS AND MIGRAINE   famotidine  (PEPCID ) 20 MG tablet Take 20 mg by mouth every other day. Alternates with omeprazole    gabapentin  (NEURONTIN ) 300 MG capsule Take 1 capsule (300 mg total)  by mouth 2 (two) times daily.   glucose blood (ONETOUCH ULTRA TEST) test strip Use as instructed to check sugars twice daily E11.69   IBUPROFEN PO Take by mouth as needed.   Iron , Ferrous Sulfate , 325 (65 Fe) MG TABS Take 325 mg by mouth once a week. (Patient taking differently: Take 325 mg by mouth once a week. Taking twice a week)   isosorbide  mononitrate (IMDUR ) 60 MG 24 hr tablet TAKE 0.5 TABLETS (30 MG TOTAL) BY MOUTH 2 (TWO) TIMES DAILY.   Lancets (ONETOUCH DELICA PLUS LANCET33G) MISC Use as instructed to check blood sugar 2 times a day   levothyroxine  (SYNTHROID ) 100 MCG tablet Take 1 tablet (100 mcg total) by mouth daily before breakfast.   loratadine  (CLARITIN ) 10 MG tablet Take 1 tablet (10 mg total) by mouth daily.   metFORMIN  (GLUCOPHAGE ) 500 MG tablet Take 1 tablet (500 mg total) by mouth daily with breakfast.   metoprolol  succinate (TOPROL -XL)  25 MG 24 hr tablet Take 0.5 tablets (12.5 mg total) by mouth daily.   Multiple Vitamin (MULTIVITAMIN WITH MINERALS) TABS tablet Take 1 tablet by mouth daily. One A Day for Women   omeprazole  (PRILOSEC) 20 MG capsule Take 1 capsule (20 mg total) by mouth every other day.   ONETOUCH VERIO test strip USE AS INSTRUCTED TO CHECK BLOOD SUGAR 2 TIMES A DAY   OXYGEN  Inhale 2 L into the lungs at bedtime.   ranolazine  (RANEXA ) 500 MG 12 hr tablet Take 1 tablet (500 mg total) by mouth 2 (two) times daily.   Rhubarb (ESTROVEN COMPLETE PO) Take 1 tablet by mouth daily.   Semaglutide , 2 MG/DOSE, (OZEMPIC , 2 MG/DOSE,) 8 MG/3ML SOPN INJECT 2 MG AS DIRECTED ONCE A WEEK.   simvastatin  (ZOCOR ) 40 MG tablet Take 1 tablet (40 mg total) by mouth at bedtime.   torsemide  (DEMADEX ) 20 MG tablet Take 1.5 tablets (30 mg total) by mouth daily.   traMADol  (ULTRAM ) 50 MG tablet TAKE 1 TABLET (50 MG TOTAL) BY MOUTH TWICE A DAY AS NEEDED FOR MODERATE PAIN   Turmeric 500 MG CAPS Take 1 capsule by mouth 2 (two) times daily as needed.   vitamin B-12 (CYANOCOBALAMIN ) 500 MCG  tablet Take 1 tablet (500 mcg total) by mouth 2 (two) times a week.   ziprasidone  (GEODON ) 40 MG capsule Take 1 capsule (40 mg total) by mouth 2 (two) times daily with a meal.    Immunization History  Administered Date(s) Administered   Fluad Quad(high Dose 65+) 04/24/2019, 05/26/2020, 05/16/2022, 06/28/2023   Influenza Split 05/09/2011, 06/11/2012   Influenza Whole 05/20/2010   Influenza, High Dose Seasonal PF 06/01/2021, 06/28/2023   Influenza,inj,Quad PF,6+ Mos 07/14/2013, 06/16/2014, 04/30/2015, 05/31/2016, 05/10/2017, 04/19/2018   Moderna Covid-19 Vaccine  Bivalent Booster 55yrs & up 06/24/2021   Moderna SARS-COV2 Booster Vaccination 06/21/2020, 12/31/2020   Moderna Sars-Covid-2 Vaccination 10/18/2019, 11/15/2019   PFIZER Comirnaty(Gray Top)Covid-19 Tri-Sucrose Vaccine 06/28/2023   PNEUMOCOCCAL CONJUGATE-20 06/28/2023   Pfizer(Comirnaty)Fall Seasonal Vaccine 12 years and older 07/15/2022, 06/28/2023   Pneumococcal Conjugate-13 11/14/2017, 04/19/2018   Pneumococcal Polysaccharide-23 04/18/2011, 05/31/2021   Respiratory Syncytial Virus Vaccine,Recomb Aduvanted(Arexvy) 07/15/2022   Td 04/18/2011   Tdap 06/28/2023   Zoster Recombinant(Shingrix) 11/14/2017, 01/02/2018   Zoster, Live 12/29/2013        Objective:     BP (!) 140/70   Pulse 81   Temp 98.3 F (36.8 C) (Oral)   Ht 5' 0.5 (1.537 m)   Wt 194 lb 3.2 oz (88.1 kg)   SpO2 97%   BMI 37.30 kg/m   SpO2: 97 %  GENERAL: Obese woman, presents in transport chair.  No conversational dyspnea.  Looks older than stated age.  Looks very deconditioned. HEAD: Normocephalic, atraumatic. EYES: PERRL, anicteric sclera. MOUTH: Edentulous, oral mucosa moist. NECK: Supple. No thyromegaly. Trachea midline. No JVD.  No adenopathy.  Prominent dorsocervical fat pad posteriorly. PULMONARY: Good air entry bilaterally.  No adventitious sounds. CARDIOVASCULAR: S1 and S2. Regular rate and rhythm.  No rubs, murmurs or gallops heard.    ABDOMEN: Obese, nondistended. MUSCULOSKELETAL: No joint deformity, no clubbing, no edema.  Median sternotomy scar well-healed. NEUROLOGIC: No overt focal deficit.  Speech is fluent. SKIN: Intact,warm,dry. PSYCH: Mood and behavior normal     Assessment & Plan:     ICD-10-CM   1. Stage 1 mild COPD by GOLD classification (HCC)  J44.9     2. Nocturnal hypoxemia due to obesity  E66.9    G47.36  3. Physical deconditioning  R53.81     4. S/P CABG (coronary artery bypass graft)  Z95.1     5. Former heavy cigarette smoker (20-39 per day)  Z87.891      Discussion:    Chronic obstructive pulmonary disease (COPD) with emphysema COPD with emphysema, well-managed. No new nodules or significant changes on recent CT scan. Effective use of Breztri  inhaler. Occasional use of supplemental oxygen  during the day, primarily at night. - Continue Breztri  inhaler as prescribed - Continue nighttime oxygen  use - Allow occasional daytime oxygen  use as needed - No repeat pulmonary function tests until next year  Coronary artery disease, post-CABG Coronary artery disease status post-CABG, well-managed. Recent PET CT of the heart was satisfactory. Continues cardiology follow-up. - Continue follow-up with cardiologist, next appointment in October  Shortness of breath Shortness of breath likely related to COPD and emphysema. Managed with current inhaler and supplemental oxygen  use. - Continue current management with Breztri  inhaler and supplemental oxygen  as needed  - Patient's prescriptions are up-to-date.      Advised if symptoms do not improve or worsen, to please contact office for sooner follow up or seek emergency care.    I spent 35 minutes of dedicated to the care of this patient on the date of this encounter to include pre-visit review of records, face-to-face time with the patient discussing conditions above, post visit ordering of testing, clinical documentation with the electronic  health record, making appropriate referrals as documented, and communicating necessary findings to members of the patients care team.     C. Leita Sanders, MD Advanced Bronchoscopy PCCM  [facility]    *This note was generated using voice recognition software/Dragon and/or AI transcription program.  Despite best efforts to proofread, errors can occur which can change the meaning. Any transcriptional errors that result from this process are unintentional and may not be fully corrected at the time of dictation.

## 2024-04-12 ENCOUNTER — Other Ambulatory Visit: Payer: Self-pay | Admitting: Nurse Practitioner

## 2024-04-26 ENCOUNTER — Encounter: Payer: Self-pay | Admitting: Pulmonary Disease

## 2024-04-27 ENCOUNTER — Other Ambulatory Visit: Payer: Self-pay | Admitting: Family Medicine

## 2024-04-27 DIAGNOSIS — E1169 Type 2 diabetes mellitus with other specified complication: Secondary | ICD-10-CM

## 2024-05-05 ENCOUNTER — Ambulatory Visit (INDEPENDENT_AMBULATORY_CARE_PROVIDER_SITE_OTHER)

## 2024-05-05 DIAGNOSIS — Z23 Encounter for immunization: Secondary | ICD-10-CM | POA: Diagnosis not present

## 2024-05-05 NOTE — Progress Notes (Signed)
 Per orders of Dr. Anton Blas, injection of HD flu vaccine given by Greig Daring in right deltoid. Patient tolerated injection well.

## 2024-05-06 ENCOUNTER — Other Ambulatory Visit: Payer: Self-pay

## 2024-05-06 ENCOUNTER — Emergency Department

## 2024-05-06 ENCOUNTER — Inpatient Hospital Stay
Admission: EM | Admit: 2024-05-06 | Discharge: 2024-05-14 | DRG: 871 | Disposition: A | Discharge status: Home / Self Care | Attending: Student | Admitting: Student

## 2024-05-06 DIAGNOSIS — J449 Chronic obstructive pulmonary disease, unspecified: Secondary | ICD-10-CM | POA: Diagnosis not present

## 2024-05-06 DIAGNOSIS — G4733 Obstructive sleep apnea (adult) (pediatric): Secondary | ICD-10-CM | POA: Diagnosis not present

## 2024-05-06 DIAGNOSIS — Z800 Family history of malignant neoplasm of digestive organs: Secondary | ICD-10-CM

## 2024-05-06 DIAGNOSIS — Z7984 Long term (current) use of oral hypoglycemic drugs: Secondary | ICD-10-CM

## 2024-05-06 DIAGNOSIS — E871 Hypo-osmolality and hyponatremia: Secondary | ICD-10-CM | POA: Diagnosis present

## 2024-05-06 DIAGNOSIS — Z7951 Long term (current) use of inhaled steroids: Secondary | ICD-10-CM

## 2024-05-06 DIAGNOSIS — E611 Iron deficiency: Secondary | ICD-10-CM | POA: Diagnosis present

## 2024-05-06 DIAGNOSIS — F319 Bipolar disorder, unspecified: Secondary | ICD-10-CM | POA: Diagnosis present

## 2024-05-06 DIAGNOSIS — Z9981 Dependence on supplemental oxygen: Secondary | ICD-10-CM

## 2024-05-06 DIAGNOSIS — N12 Tubulo-interstitial nephritis, not specified as acute or chronic: Secondary | ICD-10-CM | POA: Diagnosis not present

## 2024-05-06 DIAGNOSIS — A419 Sepsis, unspecified organism: Secondary | ICD-10-CM | POA: Diagnosis not present

## 2024-05-06 DIAGNOSIS — G9341 Metabolic encephalopathy: Secondary | ICD-10-CM | POA: Diagnosis not present

## 2024-05-06 DIAGNOSIS — I5022 Chronic systolic (congestive) heart failure: Secondary | ICD-10-CM | POA: Diagnosis present

## 2024-05-06 DIAGNOSIS — B9561 Methicillin susceptible Staphylococcus aureus infection as the cause of diseases classified elsewhere: Secondary | ICD-10-CM | POA: Diagnosis not present

## 2024-05-06 DIAGNOSIS — I6523 Occlusion and stenosis of bilateral carotid arteries: Secondary | ICD-10-CM | POA: Diagnosis not present

## 2024-05-06 DIAGNOSIS — R000 Tachycardia, unspecified: Secondary | ICD-10-CM | POA: Diagnosis not present

## 2024-05-06 DIAGNOSIS — J4489 Other specified chronic obstructive pulmonary disease: Secondary | ICD-10-CM | POA: Diagnosis present

## 2024-05-06 DIAGNOSIS — R7881 Bacteremia: Secondary | ICD-10-CM | POA: Diagnosis not present

## 2024-05-06 DIAGNOSIS — Z823 Family history of stroke: Secondary | ICD-10-CM

## 2024-05-06 DIAGNOSIS — I2510 Atherosclerotic heart disease of native coronary artery without angina pectoris: Secondary | ICD-10-CM | POA: Diagnosis not present

## 2024-05-06 DIAGNOSIS — K760 Fatty (change of) liver, not elsewhere classified: Secondary | ICD-10-CM | POA: Diagnosis not present

## 2024-05-06 DIAGNOSIS — K5730 Diverticulosis of large intestine without perforation or abscess without bleeding: Secondary | ICD-10-CM | POA: Diagnosis not present

## 2024-05-06 DIAGNOSIS — N179 Acute kidney failure, unspecified: Secondary | ICD-10-CM | POA: Diagnosis present

## 2024-05-06 DIAGNOSIS — E039 Hypothyroidism, unspecified: Secondary | ICD-10-CM | POA: Diagnosis present

## 2024-05-06 DIAGNOSIS — Z90710 Acquired absence of both cervix and uterus: Secondary | ICD-10-CM

## 2024-05-06 DIAGNOSIS — R404 Transient alteration of awareness: Secondary | ICD-10-CM | POA: Diagnosis not present

## 2024-05-06 DIAGNOSIS — Z888 Allergy status to other drugs, medicaments and biological substances status: Secondary | ICD-10-CM

## 2024-05-06 DIAGNOSIS — Z6837 Body mass index (BMI) 37.0-37.9, adult: Secondary | ICD-10-CM

## 2024-05-06 DIAGNOSIS — K219 Gastro-esophageal reflux disease without esophagitis: Secondary | ICD-10-CM | POA: Diagnosis present

## 2024-05-06 DIAGNOSIS — I110 Hypertensive heart disease with heart failure: Secondary | ICD-10-CM | POA: Diagnosis present

## 2024-05-06 DIAGNOSIS — B9620 Unspecified Escherichia coli [E. coli] as the cause of diseases classified elsewhere: Secondary | ICD-10-CM

## 2024-05-06 DIAGNOSIS — Z96652 Presence of left artificial knee joint: Secondary | ICD-10-CM | POA: Diagnosis present

## 2024-05-06 DIAGNOSIS — S40922A Unspecified superficial injury of left upper arm, initial encounter: Secondary | ICD-10-CM | POA: Diagnosis not present

## 2024-05-06 DIAGNOSIS — X102XXA Contact with fats and cooking oils, initial encounter: Secondary | ICD-10-CM | POA: Diagnosis not present

## 2024-05-06 DIAGNOSIS — E1169 Type 2 diabetes mellitus with other specified complication: Secondary | ICD-10-CM | POA: Diagnosis present

## 2024-05-06 DIAGNOSIS — R4182 Altered mental status, unspecified: Secondary | ICD-10-CM | POA: Diagnosis not present

## 2024-05-06 DIAGNOSIS — M797 Fibromyalgia: Secondary | ICD-10-CM | POA: Diagnosis not present

## 2024-05-06 DIAGNOSIS — Z825 Family history of asthma and other chronic lower respiratory diseases: Secondary | ICD-10-CM

## 2024-05-06 DIAGNOSIS — L8990 Pressure ulcer of unspecified site, unspecified stage: Secondary | ICD-10-CM | POA: Insufficient documentation

## 2024-05-06 DIAGNOSIS — E872 Acidosis, unspecified: Secondary | ICD-10-CM | POA: Diagnosis not present

## 2024-05-06 DIAGNOSIS — U071 COVID-19: Secondary | ICD-10-CM | POA: Diagnosis present

## 2024-05-06 DIAGNOSIS — R6520 Severe sepsis without septic shock: Secondary | ICD-10-CM | POA: Diagnosis not present

## 2024-05-06 DIAGNOSIS — Z803 Family history of malignant neoplasm of breast: Secondary | ICD-10-CM

## 2024-05-06 DIAGNOSIS — N16 Renal tubulo-interstitial disorders in diseases classified elsewhere: Secondary | ICD-10-CM | POA: Diagnosis not present

## 2024-05-06 DIAGNOSIS — E785 Hyperlipidemia, unspecified: Secondary | ICD-10-CM | POA: Diagnosis present

## 2024-05-06 DIAGNOSIS — Z79890 Hormone replacement therapy (postmenopausal): Secondary | ICD-10-CM

## 2024-05-06 DIAGNOSIS — I255 Ischemic cardiomyopathy: Secondary | ICD-10-CM | POA: Diagnosis not present

## 2024-05-06 DIAGNOSIS — Z806 Family history of leukemia: Secondary | ICD-10-CM

## 2024-05-06 DIAGNOSIS — Z87891 Personal history of nicotine dependence: Secondary | ICD-10-CM

## 2024-05-06 DIAGNOSIS — E1140 Type 2 diabetes mellitus with diabetic neuropathy, unspecified: Secondary | ICD-10-CM | POA: Diagnosis not present

## 2024-05-06 DIAGNOSIS — Z8261 Family history of arthritis: Secondary | ICD-10-CM

## 2024-05-06 DIAGNOSIS — Z951 Presence of aortocoronary bypass graft: Secondary | ICD-10-CM

## 2024-05-06 DIAGNOSIS — B957 Other staphylococcus as the cause of diseases classified elsewhere: Secondary | ICD-10-CM | POA: Diagnosis not present

## 2024-05-06 DIAGNOSIS — R231 Pallor: Secondary | ICD-10-CM | POA: Diagnosis not present

## 2024-05-06 DIAGNOSIS — L89152 Pressure ulcer of sacral region, stage 2: Secondary | ICD-10-CM | POA: Diagnosis present

## 2024-05-06 DIAGNOSIS — Z8349 Family history of other endocrine, nutritional and metabolic diseases: Secondary | ICD-10-CM

## 2024-05-06 DIAGNOSIS — R509 Fever, unspecified: Secondary | ICD-10-CM | POA: Diagnosis not present

## 2024-05-06 DIAGNOSIS — I10 Essential (primary) hypertension: Secondary | ICD-10-CM | POA: Diagnosis present

## 2024-05-06 DIAGNOSIS — A4151 Sepsis due to Escherichia coli [E. coli]: Secondary | ICD-10-CM | POA: Diagnosis not present

## 2024-05-06 DIAGNOSIS — R918 Other nonspecific abnormal finding of lung field: Secondary | ICD-10-CM | POA: Diagnosis not present

## 2024-05-06 DIAGNOSIS — Z833 Family history of diabetes mellitus: Secondary | ICD-10-CM

## 2024-05-06 DIAGNOSIS — Z8249 Family history of ischemic heart disease and other diseases of the circulatory system: Secondary | ICD-10-CM

## 2024-05-06 DIAGNOSIS — N390 Urinary tract infection, site not specified: Secondary | ICD-10-CM | POA: Diagnosis not present

## 2024-05-06 DIAGNOSIS — Z7982 Long term (current) use of aspirin: Secondary | ICD-10-CM

## 2024-05-06 DIAGNOSIS — Z79899 Other long term (current) drug therapy: Secondary | ICD-10-CM

## 2024-05-06 DIAGNOSIS — I700 Atherosclerosis of aorta: Secondary | ICD-10-CM | POA: Diagnosis not present

## 2024-05-06 DIAGNOSIS — I517 Cardiomegaly: Secondary | ICD-10-CM | POA: Diagnosis not present

## 2024-05-06 LAB — LACTIC ACID, PLASMA
Lactic Acid, Venous: 1.9 mmol/L (ref 0.5–1.9)
Lactic Acid, Venous: 2 mmol/L (ref 0.5–1.9)

## 2024-05-06 LAB — GLUCOSE, CAPILLARY
Glucose-Capillary: 150 mg/dL — ABNORMAL HIGH (ref 70–99)
Glucose-Capillary: 149 mg/dL — ABNORMAL HIGH (ref 70–99)

## 2024-05-06 LAB — URINALYSIS, ROUTINE W REFLEX MICROSCOPIC
Bilirubin Urine: NEGATIVE
Glucose, UA: NEGATIVE mg/dL
Ketones, ur: NEGATIVE mg/dL
Nitrite: POSITIVE — AB
Protein, ur: 100 mg/dL — AB
Specific Gravity, Urine: 1.015 (ref 1.005–1.030)
WBC, UA: >50 WBC/hpf (ref 0–5)
pH: 6 (ref 5.0–8.0)

## 2024-05-06 LAB — COMPREHENSIVE METABOLIC PANEL WITH GFR
ALT: 17 U/L (ref 0–44)
AST: 29 U/L (ref 15–41)
Albumin: 3.5 g/dL (ref 3.5–5.0)
Alkaline Phosphatase: 42 U/L (ref 38–126)
Anion gap: 16 — ABNORMAL HIGH (ref 5–15)
BUN: 15 mg/dL (ref 8–23)
CO2: 20 mmol/L — ABNORMAL LOW (ref 22–32)
Calcium: 8.8 mg/dL — ABNORMAL LOW (ref 8.9–10.3)
Chloride: 97 mmol/L — ABNORMAL LOW (ref 98–111)
Creatinine, Ser: 1.1 mg/dL — ABNORMAL HIGH (ref 0.44–1.00)
GFR, Estimated: 54 mL/min — ABNORMAL LOW (ref >60)
Glucose, Bld: 154 mg/dL — ABNORMAL HIGH (ref 70–99)
Potassium: 3.9 mmol/L (ref 3.5–5.1)
Sodium: 133 mmol/L — ABNORMAL LOW (ref 135–145)
Total Bilirubin: 0.9 mg/dL (ref 0.0–1.2)
Total Protein: 7.3 g/dL (ref 6.5–8.1)

## 2024-05-06 LAB — CBC WITH DIFFERENTIAL/PLATELET
Abs Immature Granulocytes: 0.21 K/uL — ABNORMAL HIGH (ref 0.00–0.07)
Basophils Absolute: 0.1 K/uL (ref 0.0–0.1)
Basophils Relative: 0 %
Eosinophils Absolute: 0 K/uL (ref 0.0–0.5)
Eosinophils Relative: 0 %
HCT: 36.8 % (ref 36.0–46.0)
Hemoglobin: 12.8 g/dL (ref 12.0–15.0)
Immature Granulocytes: 1 %
Lymphocytes Relative: 4 %
Lymphs Abs: 1.1 K/uL (ref 0.7–4.0)
MCH: 32.1 pg (ref 26.0–34.0)
MCHC: 34.8 g/dL (ref 30.0–36.0)
MCV: 92.2 fL (ref 80.0–100.0)
Monocytes Absolute: 1.3 K/uL — ABNORMAL HIGH (ref 0.1–1.0)
Monocytes Relative: 5 %
Neutro Abs: 22.6 K/uL — ABNORMAL HIGH (ref 1.7–7.7)
Neutrophils Relative %: 90 %
Platelets: 378 K/uL (ref 150–400)
RBC: 3.99 MIL/uL (ref 3.87–5.11)
RDW: 12.7 % (ref 11.5–15.5)
Smear Review: NORMAL
WBC: 25.3 K/uL — ABNORMAL HIGH (ref 4.0–10.5)
nRBC: 0 % (ref 0.0–0.2)

## 2024-05-06 LAB — RESP PANEL BY RT-PCR (RSV, FLU A&B, COVID)  RVPGX2
Influenza A by PCR: NEGATIVE
Influenza B by PCR: NEGATIVE
Resp Syncytial Virus by PCR: NEGATIVE
SARS Coronavirus 2 by RT PCR: POSITIVE — AB

## 2024-05-06 MED ORDER — SODIUM CHLORIDE 0.9 % IV SOLN
2.0000 g | Freq: Once | INTRAVENOUS | Status: AC
  Administered 2024-05-06: 2 g via INTRAVENOUS
  Filled 2024-05-06: qty 12.5

## 2024-05-06 MED ORDER — ONDANSETRON HCL 4 MG/2ML IJ SOLN: 4.0000 mg | Freq: Four times a day (QID) | INTRAMUSCULAR | Status: AC | PRN

## 2024-05-06 MED ORDER — SODIUM CHLORIDE 0.9 % IV SOLN
2.0000 g | INTRAVENOUS | Status: DC
  Administered 2024-05-06 – 2024-05-08 (×3): 2 g via INTRAVENOUS
  Filled 2024-05-06 (×3): qty 20

## 2024-05-06 MED ORDER — LACTATED RINGERS IV SOLN: 150.0000 mL/h | INTRAVENOUS | Status: DC

## 2024-05-06 MED ORDER — BUDESON-GLYCOPYRROL-FORMOTEROL 160-9-4.8 MCG/ACT IN AERO
2.0000 | INHALATION_SPRAY | Freq: Two times a day (BID) | RESPIRATORY_TRACT | Status: DC
Start: 2024-05-07 — End: 2024-05-14
  Administered 2024-05-07 – 2024-05-14 (×14): 2 via RESPIRATORY_TRACT
  Filled 2024-05-06 (×2): qty 5.9

## 2024-05-06 MED ORDER — SODIUM CHLORIDE 0.9 % IV BOLUS (SEPSIS)
1000.0000 mL | Freq: Once | INTRAVENOUS | Status: AC
  Administered 2024-05-06: 1000 mL via INTRAVENOUS

## 2024-05-06 MED ORDER — HYDRALAZINE HCL 20 MG/ML IJ SOLN: 5.0000 mg | Freq: Four times a day (QID) | INTRAMUSCULAR | Status: DC | PRN

## 2024-05-06 MED ORDER — ALBUTEROL SULFATE (2.5 MG/3ML) 0.083% IN NEBU: 2.5000 mg | INHALATION_SOLUTION | Freq: Four times a day (QID) | RESPIRATORY_TRACT | Status: DC | PRN

## 2024-05-06 MED ORDER — FAMOTIDINE 20 MG PO TABS
20.0000 mg | ORAL_TABLET | ORAL | Status: DC
  Administered 2024-05-06 – 2024-05-14 (×5): 20 mg via ORAL
  Filled 2024-05-06 (×6): qty 1

## 2024-05-06 MED ORDER — VANCOMYCIN HCL IN DEXTROSE 1-5 GM/200ML-% IV SOLN
1000.0000 mg | Freq: Once | INTRAVENOUS | Status: AC
  Administered 2024-05-06: 1000 mg via INTRAVENOUS
  Filled 2024-05-06: qty 200

## 2024-05-06 MED ORDER — METOPROLOL TARTRATE 5 MG/5ML IV SOLN
5.0000 mg | INTRAVENOUS | Status: DC | PRN
Start: 2024-05-06 — End: 2024-05-07

## 2024-05-06 MED ORDER — PANTOPRAZOLE SODIUM 40 MG PO TBEC
40.0000 mg | DELAYED_RELEASE_TABLET | ORAL | Status: DC
Start: 2024-05-07 — End: 2024-05-14
  Administered 2024-05-07 – 2024-05-13 (×4): 40 mg via ORAL
  Filled 2024-05-06 (×4): qty 1

## 2024-05-06 MED ORDER — RANOLAZINE ER 500 MG PO TB12
500.0000 mg | ORAL_TABLET | Freq: Two times a day (BID) | ORAL | Status: DC
  Administered 2024-05-06 – 2024-05-14 (×16): 500 mg via ORAL
  Filled 2024-05-06 (×16): qty 1

## 2024-05-06 MED ORDER — ACETAMINOPHEN 325 MG PO TABS
650.0000 mg | ORAL_TABLET | Freq: Four times a day (QID) | ORAL | Status: AC | PRN
  Administered 2024-05-10 – 2024-05-11 (×2): 650 mg via ORAL
  Filled 2024-05-06 (×2): qty 2

## 2024-05-06 MED ORDER — INSULIN ASPART 100 UNIT/ML IJ SOLN
0.0000 [IU] | Freq: Three times a day (TID) | INTRAMUSCULAR | Status: DC
  Administered 2024-05-06 – 2024-05-09 (×4): 3 [IU] via SUBCUTANEOUS
  Administered 2024-05-11: 4 [IU] via SUBCUTANEOUS
  Administered 2024-05-12 (×2): 3 [IU] via SUBCUTANEOUS
  Filled 2024-05-06 (×8): qty 1

## 2024-05-06 MED ORDER — LACTATED RINGERS IV BOLUS
1000.0000 mL | Freq: Once | INTRAVENOUS | Status: AC
  Administered 2024-05-06: 1000 mL via INTRAVENOUS

## 2024-05-06 MED ORDER — ONDANSETRON HCL 4 MG PO TABS: 4.0000 mg | ORAL_TABLET | Freq: Four times a day (QID) | ORAL | Status: AC | PRN

## 2024-05-06 MED ORDER — INSULIN ASPART 100 UNIT/ML IJ SOLN: 0.0000 [IU] | Freq: Every day | INTRAMUSCULAR | Status: DC

## 2024-05-06 MED ORDER — HEPARIN SODIUM (PORCINE) 5000 UNIT/ML IJ SOLN
5000.0000 [IU] | Freq: Three times a day (TID) | INTRAMUSCULAR | Status: DC
  Administered 2024-05-06 – 2024-05-14 (×23): 5000 [IU] via SUBCUTANEOUS
  Filled 2024-05-06 (×23): qty 1

## 2024-05-06 MED ORDER — ACETAMINOPHEN 650 MG RE SUPP: 650.0000 mg | Freq: Four times a day (QID) | RECTAL | Status: AC | PRN

## 2024-05-06 MED ORDER — SODIUM CHLORIDE 0.9 % IV SOLN
INTRAVENOUS | Status: AC
Start: 2024-05-06 — End: 2024-05-07

## 2024-05-06 MED ORDER — SIMVASTATIN 20 MG PO TABS
40.0000 mg | ORAL_TABLET | Freq: Every day | ORAL | Status: DC
  Administered 2024-05-07 – 2024-05-13 (×7): 40 mg via ORAL
  Filled 2024-05-06 (×8): qty 2

## 2024-05-06 MED ORDER — LEVOTHYROXINE SODIUM 50 MCG PO TABS
100.0000 ug | ORAL_TABLET | Freq: Every day | ORAL | Status: DC
  Administered 2024-05-07 – 2024-05-14 (×8): 100 ug via ORAL
  Filled 2024-05-06 (×8): qty 2

## 2024-05-06 MED ORDER — GABAPENTIN 300 MG PO CAPS
300.0000 mg | ORAL_CAPSULE | Freq: Two times a day (BID) | ORAL | Status: DC
  Administered 2024-05-06 – 2024-05-14 (×16): 300 mg via ORAL
  Filled 2024-05-06 (×16): qty 1

## 2024-05-06 MED ORDER — METRONIDAZOLE 500 MG/100ML IV SOLN
500.0000 mg | Freq: Once | INTRAVENOUS | Status: AC
  Administered 2024-05-06: 500 mg via INTRAVENOUS
  Filled 2024-05-06: qty 100

## 2024-05-06 NOTE — Progress Notes (Signed)
 CODE SEPSIS - PHARMACY COMMUNICATION  **Broad Spectrum Antibiotics should be administered within 1 hour of Sepsis diagnosis**  Time Code Sepsis Called/Page Received: 1451  Antibiotics Ordered: cefepime , metronidazole , vancomycin   Time of 1st antibiotic administration: 1454  Additional action taken by pharmacy: N/A  If necessary, Name of Provider/Nurse Contacted: N/A    Lum VEAR Mania ,PharmD Clinical Pharmacist  05/06/2024  2:51 PM

## 2024-05-06 NOTE — ED Notes (Signed)
 Both sets blood cultures and lactic drawn before ordered and sent to lab.

## 2024-05-06 NOTE — Assessment & Plan Note (Signed)
 Home oral antihyperglycemic agents will not be resumed on admission Insulin  SSI with at bedtime coverage ordered

## 2024-05-06 NOTE — H&P (Addendum)
 History and Physical   [name] FMW:[id number] DOB: 1951-04-09 DOA: 05/06/2024  PCP: Rilla Baller, MD  Patient coming from: Home via EMS  I have personally briefly reviewed patient's old medical records in [facility] EMR.  Chief Concern: Altered mental status  HPI: [name] is a 73 year old female with history of morbid obesity, neuropathy, hypothyroid, GERD, non-insulin -dependent diabetes mellitus type 2, hyperlipidemia, CAD, COPD, who presents ED from home for chief concerns of altered mentation.  Vitals in the ED showed t of 98.2, rr 21, hr 97, blood pressure 154/64, SpO2 100% on room air.  Serum sodium is 133, potassium 40.9, chloride 97, bicarb 20, BUN 15, serum creatinine 1.10, eGFR 54, nonfasting blood glucose 154, WBC 25.4, hemoglobin 12.8, platelets 378.  UA was positive for large leukocytes and positive for nitrates.  ED treatment: Cefepime , Flagyl , vancomycin , LR 1 L bolus. ------------------------------------- At bedside, patient able to tell me her first and last name, age, location, current calendar year.  She answered the location correctly after the second time I ask her, stating its hospital.  She reports she does not know why she is in the hospital.  She denies nausea, vomiting, dysuria, hematuria, diarrhea.  Her son Charlie at bedside states that she is more altered than her baseline.  He did not provide further information.  There was no report of fever, vomiting, diarrhea.  Social history: She denies tobacco, EtOH, recreational drug use.  She is retired.  ROS: Unable to complete as patient is awake alert and oriented however is mildly confused and a poor historian  ED Course: Discussed with EDP, patient requiring hospitalization for chief concerns of severe sepsis.  Assessment/Plan  Principal Problem:   Severe sepsis with acute organ dysfunction (HCC) Active Problems:   Type 2 diabetes mellitus with other specified complication  (HCC)   OSA (obstructive sleep apnea)   CAD (coronary artery disease)   Hypothyroidism   Hypertension   Bipolar disorder (HCC)   Hyperlipidemia associated with type 2 diabetes mellitus (HCC)   GERD (gastroesophageal reflux disease)   Hepatic steatosis   S/P CABG x 1   Morbid obesity (HCC)   COVID-19 virus infection   Pressure injury of skin   Assessment and Plan:  * Severe sepsis with acute organ dysfunction (HCC) Patient had markedly elevated leukocytosis of 25.4, source is likely urine, elevated heart rate and respiration rate, organ derangement is metabolic encephalopathy Urinary tract infection Continue with ceftriaxone  2 g IV daily to complete a 7-day course Maintain MAP greater than 65 Status post LR 1 L bolus per EP On admission I have ordered sodium chloride  1 L bolus followed by sodium chloride  infusion at 125 mL/h, 1 day ordered  COVID-19 virus infection Airborne and contact precaution Symptomatic support: Albuterol  inhaler every 6 hours as needed for wheezing and shortness of breath  Morbid obesity (HCC) This complicates overall care and prognosis.   GERD (gastroesophageal reflux disease) Hold for both today 20 mg every other day Prilosec equivalent every other day resumed on admission  Hyperlipidemia associated with type 2 diabetes mellitus (HCC) Home simvastatin  40 mg nightly resumed  Hypertension Hydralazine  5 mg IV every 6 hours as needed for SBP > 170, 5 days ordered  Hypothyroidism Home levothyroxine  100 mcg daily before breakfast resumed  OSA (obstructive sleep apnea) CPAP nightly ordered  Type 2 diabetes mellitus with other specified complication (HCC) Home oral antihyperglycemic agents will not be resumed on admission Insulin  SSI with at bedtime coverage ordered  Chart reviewed.   DVT prophylaxis: Heparin  5000 units subcutaneous every 8 hours Code Status: Full code Diet: Heart healthy/carb modified Family Communication: Updated son, Richard  at bedside with patient's permission Disposition Plan: Pending clinical course Consults called: Pharmacy Admission status: Telemetry medical, inpatient  Past Medical History:  Diagnosis Date   Anxiety    Arthritis    per patient all over body   Bipolar disorder (HCC)    has stopped seeing psychiatrist   CAD (coronary artery disease)    a. 06/2020 MV: small, sev, apical inf/apical defect-scar vs ischemia. EF 45%; b. 06/2020 Cath: LM 72m, LAD 70ost, 59m, LCX nl, RCA nl; c. 07/2020 CABG x 1: LIMA->LAD; d. 03/2022 MV: Significantly degraded imaging due to breast attenuation/obesity/extracardiac activity. No obvious isch/scar. EF 30-35% (EF 50-55% w/o rwma on f/u echo).   Cataract    Chronic bronchitis    COPD (chronic obstructive pulmonary disease) (HCC)    Depression    Dyspnea 2012   s/p pulm/cards w/u WNL, thought due to obesity/deconditioning   Fibromyalgia    GERD (gastroesophageal reflux disease)    HFmrEF (heart failure with mid-range ejection fraction) (HCC)    a. 05/2020 Echo: EF 40%, glob HK, mild LVH, Gr1 DD; b. 06/2020 EF by SPECT: 45%; c. 07/2020 intraop TEE: EF 50%; d. 03/2022 Echo: EF 50-55%, no rwma, GrI DD, mild LVH, nl RV fxn, mild MR.   History of chicken pox    HLD (hyperlipidemia)    Hypertension    Hypothyroidism    Ischemic cardiomyopathy    a. 05/2020 Echo: EF 40%; b. 06/2020 EF by SPECT: 45%; c. 07/2020 intraop TEE: EF 50%.   Migraine    OSA (obstructive sleep apnea) 03/16/2011   Pneumonia    Sleep apnea    per patient cannot tolerate CPAP   T2DM (type 2 diabetes mellitus) (HCC) 2012   DM education 06/2011   Urinary incontinence    Past Surgical History:  Procedure Laterality Date   BREAST CYST ASPIRATION Right 2003   cardiac catherization  03/2011   x3 Linda), mild nonobstructive CAD   COLONOSCOPY  12/2011   hyperplastic polyp x2,. diverticulosis, rec rpt 10 yrs   CORONARY ARTERY BYPASS GRAFT N/A 08/03/2020   Procedure: CORONARY ARTERY BYPASS  GRAFTING TIMES ONE USING LEFT INTERNAL MAMMARY ARTERY.;  Surgeon: Lucas Dorise POUR, MD;  Location: [facility] OR;  Service: Open Heart Surgery;  Laterality: N/A;   ESOPHAGOGASTRODUODENOSCOPY  2006   ESOPHAGOGASTRODUODENOSCOPY  05/2020   no esophageal abnormality to explain dysphagia - esophagus dilated. Erosive gastropathy - neg H pylori Oma)   KNEE SURGERY  2006   left, torn meniscus   RIGHT/LEFT HEART CATH AND CORONARY ANGIOGRAPHY N/A 07/13/2020   Procedure: RIGHT/LEFT HEART CATH AND CORONARY ANGIOGRAPHY;  Surgeon: Mady Bruckner, MD;  Location: [facility] INVASIVE CV LAB;  Service: Cardiovascular;  Laterality: N/A;   TEE WITHOUT CARDIOVERSION N/A 08/03/2020   Procedure: TRANSESOPHAGEAL ECHOCARDIOGRAM (TEE);  Surgeon: Lucas Dorise POUR, MD;  Location: [facility] OR;  Service: Open Heart Surgery;  Laterality: N/A;   TOTAL ABDOMINAL HYSTERECTOMY  2004   fibroids, cervix remained   TOTAL KNEE ARTHROPLASTY Left 08/2012   Claudene,  ortho   Social History:  reports that she quit smoking about 13 years ago. Her smoking use included cigarettes. She started smoking about 57 years ago. She has a 86 pack-year smoking history. She has never used smokeless tobacco. She reports that she does not drink alcohol and does not use drugs.  Allergies  Allergen Reactions   Crestor [Rosuvastatin] Other (See Comments)    myalgias   Lipitor [Atorvastatin ] Other (See Comments)    Mood swings   Lithium Other (See Comments)    abd pain, n/v, was hospitalized   Quetiapine Other (See Comments)    Muscle twitching, muscle spasm   Family History  Problem Relation Age of Onset   Asthma Maternal Uncle    Cancer Maternal Uncle        colon   Aneurysm Maternal Uncle    Colon cancer Maternal Uncle 40   Hypertension Mother    Stroke Mother    Arthritis Mother    COPD Sister    Acute lymphoblastic leukemia Cousin 3       died at age 65   Cancer Father 74       brain tumor   Diabetes Father    Thyroid  disease Father     Cancer Maternal Aunt 74       breast   Breast cancer Maternal Aunt    Cancer Paternal Uncle        prostate   Coronary artery disease Neg Hx    Stomach cancer Neg Hx    Rectal cancer Neg Hx    Esophageal cancer Neg Hx    Family history: Family history reviewed and not pertinent.  Prior to Admission medications   Medication Sig Start Date End Date Taking? Authorizing Provider  acetaminophen  (TYLENOL  8 HOUR ARTHRITIS PAIN) 650 MG CR tablet Take 650 mg by mouth every 8 (eight) hours as needed for pain.    [provider]  albuterol  (VENTOLIN  HFA) 108 (90 Base) MCG/ACT inhaler Inhale 2 puffs into the lungs every 6 (six) hours as needed for wheezing or shortness of breath. 03/29/23   Tamea Dedra CROME, MD  aspirin  81 MG EC tablet Take 1 tablet (81 mg total) by mouth daily. 10/08/20   Darliss Rogue, MD  aspirin -acetaminophen -caffeine  (EXCEDRIN MIGRAINE) 250-250-65 MG tablet Take by mouth every 6 (six) hours as needed for headache.    [provider]  Blood Glucose Monitoring Suppl (ONETOUCH VERIO REFLECT) w/Device KIT 1 KIT BY DOES NOT APPLY ROUTE ONCE FOR 1 DOSE. USE AS INSTRUCTED TO CHECK BLOOD SUGAR ONCE A DAY 04/28/24   Rilla Baller, MD  Boswellia-Glucosamine-Vit D (OSTEO BI-FLEX ONE PER DAY PO) Take 1 tablet by mouth daily.    [provider]  Budeson-Glycopyrrol-Formoterol  (BREZTRI  AEROSPHERE) 160-9-4.8 MCG/ACT AERO Inhale 2 puffs into the lungs in the morning and at bedtime. 10/26/23   Tamea Dedra CROME, MD  Calcium  Carb-Cholecalciferol (CALCIUM  + VITAMIN D3 PO) Take by mouth daily.    [provider]  cyclobenzaprine  (FLEXERIL ) 10 MG tablet TAKE 1/2 TO 1 TABLET BY MOUTH TWICE A DAY AS NEEDED FOR MUSCLE SPASMS AND MIGRAINE 11/23/23   Rilla Baller, MD  famotidine  (PEPCID ) 20 MG tablet Take 20 mg by mouth every other day. Alternates with omeprazole     [provider]  gabapentin  (NEURONTIN ) 300 MG capsule Take 1 capsule (300 mg total) by  mouth 2 (two) times daily. 11/23/23   Rilla Baller, MD  glucose blood [facility] ULTRA TEST) test strip Use as instructed to check sugars twice daily E11.69 03/19/23   Rilla Baller, MD  IBUPROFEN PO Take by mouth as needed.    [provider]  Iron , Ferrous Sulfate , 325 (65 Fe) MG TABS Take 325 mg by mouth once a week. Patient taking differently: Take 325 mg by mouth once a week. Taking twice a  week 11/15/22   Rilla Baller, MD  isosorbide  mononitrate (IMDUR ) 60 MG 24 hr tablet TAKE 0.5 TABLETS (30 MG TOTAL) BY MOUTH 2 (TWO) TIMES DAILY. 04/14/24 06/13/24  Darliss Rogue, MD  Lancets [facility] DELICA PLUS [place]) MISC Use as instructed to check blood sugar 2 times a day 03/26/23   Rilla Baller, MD  levothyroxine  (SYNTHROID ) 100 MCG tablet Take 1 tablet (100 mcg total) by mouth daily before breakfast. 11/23/23   Rilla Baller, MD  loratadine  (CLARITIN ) 10 MG tablet Take 1 tablet (10 mg total) by mouth daily. 03/22/20   Rilla Baller, MD  metFORMIN  (GLUCOPHAGE ) 500 MG tablet Take 1 tablet (500 mg total) by mouth daily with breakfast. 11/23/23   Rilla Baller, MD  metoprolol  succinate (TOPROL -XL) 25 MG 24 hr tablet Take 0.5 tablets (12.5 mg total) by mouth daily. 11/23/23   Rilla Baller, MD  Multiple Vitamin (MULTIVITAMIN WITH MINERALS) TABS tablet Take 1 tablet by mouth daily. One A Day for Women    [provider]  mupirocin  ointment (BACTROBAN ) 2 % Apply 1 Application topically 2 (two) times daily. Patient not taking: Reported on 04/10/2024 05/22/22   Hester Alm BROCKS, MD  omeprazole  (PRILOSEC) 20 MG capsule Take 1 capsule (20 mg total) by mouth every other day. 11/23/23   Rilla Baller, MD  [facility] VERIO test strip USE AS INSTRUCTED TO CHECK BLOOD SUGAR 2 TIMES A DAY 09/28/23   Rilla Baller, MD  OXYGEN  Inhale 2 L into the lungs at bedtime.    [provider]  ranolazine  (RANEXA ) 500 MG 12 hr tablet Take 1 tablet (500 mg total) by mouth 2  (two) times daily. 07/23/23   Darliss Rogue, MD  Rhubarb (ESTROVEN COMPLETE PO) Take 1 tablet by mouth daily.    [provider]  Semaglutide , 2 MG/DOSE, (OZEMPIC , 2 MG/DOSE,) 8 MG/3ML SOPN INJECT 2 MG AS DIRECTED ONCE A WEEK. 08/07/23   Rilla Baller, MD  simvastatin  (ZOCOR ) 40 MG tablet Take 1 tablet (40 mg total) by mouth at bedtime. 11/23/23   Rilla Baller, MD  torsemide  (DEMADEX ) 20 MG tablet Take 1.5 tablets (30 mg total) by mouth daily. 03/12/23   Darliss Rogue, MD  traMADol  (ULTRAM ) 50 MG tablet TAKE 1 TABLET (50 MG TOTAL) BY MOUTH TWICE A DAY AS NEEDED FOR MODERATE PAIN 02/13/24   Jimmy Charlie FERNS, MD  Turmeric 500 MG CAPS Take 1 capsule by mouth 2 (two) times daily as needed. 07/24/23   Rilla Baller, MD  vitamin B-12 (CYANOCOBALAMIN ) 500 MCG tablet Take 1 tablet (500 mcg total) by mouth 2 (two) times a week. 11/26/23   Rilla Baller, MD  ziprasidone  (GEODON ) 40 MG capsule Take 1 capsule (40 mg total) by mouth 2 (two) times daily with a meal. 11/23/23   Rilla Baller, MD   Physical Exam: Vitals:   05/06/24 1740 05/06/24 1741 05/06/24 1743 05/06/24 1811  BP:    95/69  Pulse: (!) 112 (!) 109 (!) 110 (!) 115  Resp: 15 (!) 24 (!) 23   Temp:    98.9 F (37.2 C)  TempSrc:    Oral  SpO2: 99% 100% 99% 98%  Weight:      Height:       Constitutional: appears age-appropriate, confused Eyes: PERRL, lids and conjunctivae normal ENMT: Mucous membranes are dry. Posterior pharynx clear of any exudate or lesions. Age-appropriate dentition. Hearing appropriate Neck: normal, supple, no masses, no thyromegaly Respiratory: clear to auscultation bilaterally, no wheezing, no crackles. Normal respiratory effort. No accessory muscle  use.  Cardiovascular: Regular rate and rhythm, no murmurs / rubs / gallops. No extremity edema. 2+ pedal pulses. No carotid bruits.  Abdomen: Morbidly obese abdomen, no tenderness, no masses palpated, no hepatosplenomegaly. Bowel sounds  positive.  Musculoskeletal: no clubbing / cyanosis. No joint deformity upper and lower extremities. Good ROM, no contractures, no atrophy. Normal muscle tone.  Skin: no rashes, lesions, ulcers. No induration Neurologic: Sensation intact. Strength 5/5 in all 4.  Psychiatric: Lacks judgment and insight. Alert and oriented x 3.  Mild to moderately confused mood.   EKG: independently reviewed, showing sinus tachycardia with rate of 101, QTc 453  Chest x-ray on Admission: I personally reviewed and I agree with radiologist reading as below.  DG Chest Portable 1 View Result Date: 05/06/2024 CLINICAL DATA:  Sepsis. EXAM: PORTABLE CHEST 1 VIEW COMPARISON:  02/23/2022, CT 01/28/2024 FINDINGS: Prior median sternotomy. The heart is borderline enlarged. Aortic atherosclerosis. The lungs are hyperinflated with bronchial thickening. Questionable hazy opacity at the left lung base versus soft tissue attenuation. No pneumothorax. On limited assessment, no acute osseous findings. IMPRESSION: 1. Hyperinflation with bronchial thickening, suggesting COPD. 2. Questionable hazy opacity at the left lung base versus soft tissue attenuation. Recommend PA and lateral radiograph for further assessment when patient is able. Electronically Signed   By: Andrea Gasman M.D.   On: 05/06/2024 15:50   CT Head Wo Contrast Result Date: 05/06/2024 CLINICAL DATA:  Altered mental status. EXAM: CT HEAD WITHOUT CONTRAST TECHNIQUE: Contiguous axial images were obtained from the base of the skull through the vertex without intravenous contrast. RADIATION DOSE REDUCTION: This exam was performed according to the departmental dose-optimization program which includes automated exposure control, adjustment of the mA and/or kV according to patient size and/or use of iterative reconstruction technique. COMPARISON:  May 09, 2020 FINDINGS: Brain: No evidence of acute infarction, hemorrhage, hydrocephalus, extra-axial collection or mass  lesion/mass effect. Vascular: Marked severity bilateral cavernous carotid artery calcification is noted. Skull: Normal. Negative for fracture or focal lesion. Sinuses/Orbits: There is marked severity bilateral ethmoid sinus and mild left maxillary sinus mucosal thickening. Other: None. IMPRESSION: 1. No acute intracranial abnormality. 2. Marked severity bilateral ethmoid sinus and mild left maxillary sinus disease. Electronically Signed   By: Suzen Dials M.D.   On: 05/06/2024 15:11   Labs on Admission: I have personally reviewed following labs  CBC: Recent Labs  Lab 05/06/24 1422  WBC 25.3*  NEUTROABS 22.6*  HGB 12.8  HCT 36.8  MCV 92.2  PLT 378   Basic Metabolic Panel: Recent Labs  Lab 05/06/24 1422  NA 133*  K 3.9  CL 97*  CO2 20*  GLUCOSE 154*  BUN 15  CREATININE 1.10*  CALCIUM  8.8*   GFR: Estimated Creatinine Clearance: 46.8 mL/min (A) (by C-G formula based on SCr of 1.1 mg/dL (H)).  Liver Function Tests: Recent Labs  Lab 05/06/24 1422  AST 29  ALT 17  ALKPHOS 42  BILITOT 0.9  PROT 7.3  ALBUMIN  3.5   Urine analysis:    Component Value Date/Time   COLORURINE AMBER (A) 05/06/2024 1422   APPEARANCEUR CLOUDY (A) 05/06/2024 1422   APPEARANCEUR Hazy 08/29/2012 1413   LABSPEC 1.015 05/06/2024 1422   LABSPEC 1.009 08/29/2012 1413   PHURINE 6.0 05/06/2024 1422   GLUCOSEU NEGATIVE 05/06/2024 1422   GLUCOSEU Negative 08/29/2012 1413   HGBUR MODERATE (A) 05/06/2024 1422   BILIRUBINUR NEGATIVE 05/06/2024 1422   BILIRUBINUR negative 11/23/2023 1106   BILIRUBINUR Negative 08/29/2012 1413   KETONESUR NEGATIVE  05/06/2024 1422   PROTEINUR 100 (A) 05/06/2024 1422   UROBILINOGEN 0.2 11/23/2023 1106   NITRITE POSITIVE (A) 05/06/2024 1422   LEUKOCYTESUR LARGE (A) 05/06/2024 1422   LEUKOCYTESUR Negative 08/29/2012 1413   CRITICAL CARE Performed by: Dr. Sherre  Total critical care time: 32 minutes  Critical care time was exclusive of separately billable procedures  and treating other patients.  Critical care was necessary to treat or prevent imminent or life-threatening deterioration.  Critical care was time spent personally by me on the following activities: development of treatment plan with son, Charlie at bedside, as well as nursing, discussions with consultants, evaluation of patient's response to treatment, examination of patient, obtaining history from patient or surrogate, ordering and performing treatments and interventions, ordering and review of laboratory studies, ordering and review of radiographic studies, pulse oximetry and re-evaluation of patient's condition.  This document was prepared using Dragon Voice Recognition software and may include unintentional dictation errors.  Dr. Sherre [facility]  If 7PM-7AM, please contact overnight-coverage provider If 7AM-7PM, please contact day attending provider [email or web address]  05/06/2024, 6:39 PM

## 2024-05-06 NOTE — Hospital Course (Signed)
 Ms. [name] is a 73 year old female with history of morbid obesity, neuropathy, hypothyroid, GERD, non-insulin -dependent diabetes mellitus type 2, hyperlipidemia, CAD, COPD, who presents ED from home for chief concerns of altered mentation.  Vitals in the ED showed t of 98.2, rr 21, hr 97, blood pressure 154/64, SpO2 100% on room air.  Serum sodium is 133, potassium 40.9, chloride 97, bicarb 20, BUN 15, serum creatinine 1.10, eGFR 54, nonfasting blood glucose 154, WBC 25.4, hemoglobin 12.8, platelets 378.  UA was positive for large leukocytes and positive for nitrates.  ED treatment: Cefepime , Flagyl , vancomycin , LR 1 L bolus.

## 2024-05-06 NOTE — ED Provider Notes (Signed)
 I did not participate in the care of this patient.  Dr. Willo contacted hospitalist and handoff given to hospitalist service per Dr. Willo Dicky Anes, MD 05/06/24 2042

## 2024-05-06 NOTE — Assessment & Plan Note (Signed)
 Hydralazine 5 mg IV every 6 hours as needed for SBP > 170, 5 days ordered

## 2024-05-06 NOTE — ED Provider Notes (Signed)
 [facility] Provider Note    Event Date/Time   First MD Initiated Contact with Patient 05/06/24 1422     (approximate)   History   Chief Complaint Altered Mental Status   HPI  [name] is a 73 y.o. female with past medical history of diabetes, CAD, COPD, hypothyroidism, and bipolar disorder who presents to the ED for altered mental status.  Per EMS, patient noted by husband to be extremely confused this morning after she woke up.  He had told EMS that she seemed normal last night when going to bed, but had complained of some right sided headache over the past couple of days.  On arrival, patient is not sure why she is here but currently denies any complaints.  She specifically denies any headache, neck stiffness, cough, chest pain, shortness of breath, abdominal pain, nausea, vomiting, diarrhea, or dysuria.     Physical Exam   Triage Vital Signs: ED Triage Vitals  Encounter Vitals Group     BP 05/06/24 1412 (!) 126/57     Girls Systolic BP Percentile --      Girls Diastolic BP Percentile --      Boys Systolic BP Percentile --      Boys Diastolic BP Percentile --      Pulse Rate 05/06/24 1412 (!) 103     Resp 05/06/24 1412 (!) 22     Temp 05/06/24 1412 98.2 F (36.8 C)     Temp Source 05/06/24 1412 Oral     SpO2 05/06/24 1412 100 %     Weight 05/06/24 1419 194 lb (88 kg)     Height 05/06/24 1419 5' 0.5 (1.537 m)     Head Circumference --      Peak Flow --      Pain Score 05/06/24 1419 0     Pain Loc --      Pain Education --      Exclude from Growth Chart --     Most recent vital signs: Vitals:   05/06/24 1445 05/06/24 1500  BP: (!) 140/71 (!) 154/64  Pulse: (!) 102 97  Resp: (!) 21 (!) 21  Temp:    SpO2: 99% 100%    Constitutional: Alert and oriented to person, place, and time, but not situation. Eyes: Conjunctivae are normal. Head: Atraumatic. Nose: No congestion/rhinnorhea. Mouth/Throat: Mucous membranes are moist.   Neck: Supple with no meningismus. Cardiovascular: Normal rate, regular rhythm. Grossly normal heart sounds.  2+ radial pulses bilaterally. Respiratory: Normal respiratory effort.  No retractions. Lungs CTAB. Gastrointestinal: Soft and nontender. No distention. Musculoskeletal: No lower extremity tenderness nor edema.  Neurologic:  Normal speech and language. No gross focal neurologic deficits are appreciated.    ED Results / Procedures / Treatments   Labs (all labs ordered are listed, but only abnormal results are displayed) Labs Reviewed  CBC WITH DIFFERENTIAL/PLATELET - Abnormal; Notable for the following components:      Result Value   WBC 25.3 (*)    Neutro Abs 22.6 (*)    Monocytes Absolute 1.3 (*)    Abs Immature Granulocytes 0.21 (*)    All other components within normal limits  COMPREHENSIVE METABOLIC PANEL WITH GFR - Abnormal; Notable for the following components:   Sodium 133 (*)    Chloride 97 (*)    CO2 20 (*)    Glucose, Bld 154 (*)    Creatinine, Ser 1.10 (*)    Calcium  8.8 (*)    GFR, Estimated  54 (*)    Anion gap 16 (*)    All other components within normal limits  URINALYSIS, ROUTINE W REFLEX MICROSCOPIC - Abnormal; Notable for the following components:   Color, Urine AMBER (*)    APPearance CLOUDY (*)    Hgb urine dipstick MODERATE (*)    Protein, ur 100 (*)    Nitrite POSITIVE (*)    Leukocytes,Ua LARGE (*)    Bacteria, UA MANY (*)    All other components within normal limits  CULTURE, BLOOD (ROUTINE X 2)  CULTURE, BLOOD (ROUTINE X 2)  RESP PANEL BY RT-PCR (RSV, FLU A&B, COVID)  RVPGX2  URINE CULTURE  LACTIC ACID, PLASMA  LACTIC ACID, PLASMA     EKG  ED ECG REPORT I, Carlin Palin, the attending physician, personally viewed and interpreted this ECG.   Date: 05/06/2024  EKG Time: 14:35  Rate: 101  Rhythm: sinus tachycardia  Axis: LAD  Intervals:none  ST&T Change: None  RADIOLOGY CT head reviewed and interpreted by me with no  hemorrhage or midline shift.  PROCEDURES:  Critical Care performed: No  Procedures   MEDICATIONS ORDERED IN ED: Medications  ceFEPIme  (MAXIPIME ) 2 g in sodium chloride  0.9 % 100 mL IVPB (2 g Intravenous New Bag/Given 05/06/24 1454)  metroNIDAZOLE  (FLAGYL ) IVPB 500 mg (has no administration in time range)  vancomycin  (VANCOCIN ) IVPB 1000 mg/200 mL premix (has no administration in time range)  lactated ringers  bolus 1,000 mL (1,000 mLs Intravenous New Bag/Given 05/06/24 1455)     IMPRESSION / MDM / ASSESSMENT AND PLAN / ED COURSE  I reviewed the triage vital signs and the nursing notes.                              73 y.o. female with past medical history of diabetes, CAD, COPD, hypothyroidism, and bipolar disorder who presents to the ED for altered mental status after waking up this morning.  Patient's presentation is most consistent with acute presentation with potential threat to life or bodily function.  Differential diagnosis includes, but is not limited to, stroke, TIA, sepsis, UTI, anemia, electrolyte abnormality, AKI, psychiatric illness, medication effect.  Patient nontoxic-appearing and in no acute distress, vital signs remarkable for mild tachycardia but otherwise reassuring.  She is somewhat disoriented but has a nonfocal neurologic exam, CT head by my read is unremarkable.  Labs are concerning for significant leukocytosis and with elevated heart rate, this is concerning for sepsis.  Patient started on broad-spectrum antibiotics, source of sepsis appears to be UTI given urinalysis with large leukocytes, positive nitrites, and significant WBCs.  Urine was sent for culture, additional labs with mild AKI but no significant anemia or electrolyte abnormality, LFTs unremarkable.  Case discussed with hospitalist for admission.      FINAL CLINICAL IMPRESSION(S) / ED DIAGNOSES   Final diagnoses:  Sepsis without acute organ dysfunction, due to unspecified organism (HCC)  Altered  mental status, unspecified altered mental status type  Urinary tract infection without hematuria, site unspecified     Rx / DC Orders   ED Discharge Orders     None        Note:  This document was prepared using Dragon voice recognition software and may include unintentional dictation errors.   Palin Carlin, MD 05/06/24 [phone]

## 2024-05-06 NOTE — ED Notes (Signed)
 Dr willo in with pt and family.  Resumed care from [facility]. Rn.

## 2024-05-06 NOTE — Assessment & Plan Note (Signed)
 Hold for both today 20 mg every other day Prilosec equivalent every other day resumed on admission

## 2024-05-06 NOTE — Assessment & Plan Note (Signed)
-  Home levothyroxine 100 mcg daily before breakfast resumed

## 2024-05-06 NOTE — ED Notes (Signed)
 Son reports she has been complaining of posterior head pain x 4 days and just doesn't look right in the face like she is staring at something not there.  MD notified of sons concerns.

## 2024-05-06 NOTE — Assessment & Plan Note (Signed)
Home simvastatin 40 mg nightly resumed

## 2024-05-06 NOTE — Assessment & Plan Note (Addendum)
 Patient had markedly elevated leukocytosis of 25.4, source is likely urine, elevated heart rate and respiration rate, organ derangement is metabolic encephalopathy Urinary tract infection Continue with ceftriaxone  2 g IV daily to complete a 7-day course Maintain MAP greater than 65 Status post LR 1 L bolus per EP On admission I have ordered sodium chloride  1 L bolus followed by sodium chloride  infusion at 125 mL/h, 1 day ordered

## 2024-05-06 NOTE — Assessment & Plan Note (Signed)
 CPAP nightly ordered

## 2024-05-06 NOTE — ED Notes (Signed)
 Pt alert,  iv meds infusing  family with pt  pt waiting on admisssion.  Siedrails up x 2.   Nsr on monitor.

## 2024-05-06 NOTE — Assessment & Plan Note (Signed)
 Airborne and contact precaution Symptomatic support: Albuterol  inhaler every 6 hours as needed for wheezing and shortness of breath

## 2024-05-06 NOTE — Assessment & Plan Note (Signed)
 -  This complicates overall care and prognosis.

## 2024-05-06 NOTE — Sepsis Progress Note (Signed)
 Notified bedside nurse of need to draw lactic acid.

## 2024-05-06 NOTE — ED Notes (Signed)
 Patient transported to CT

## 2024-05-06 NOTE — Sepsis Progress Note (Signed)
 Elink monitoring for the code sepsis protocol.

## 2024-05-06 NOTE — ED Triage Notes (Signed)
 BIB [facility] called for AMS  alert and oriented x 2 not her baseline  Unknown last known well.  Strong urine smell.  Keeps saying oh dear and oh my god.  152/78  HR 106  RR 26 98%RA  etco2 21  CBG 150  18g RAC with 200LR given.

## 2024-05-07 DIAGNOSIS — B957 Other staphylococcus as the cause of diseases classified elsewhere: Secondary | ICD-10-CM

## 2024-05-07 DIAGNOSIS — U071 COVID-19: Secondary | ICD-10-CM

## 2024-05-07 DIAGNOSIS — S40922A Unspecified superficial injury of left upper arm, initial encounter: Secondary | ICD-10-CM

## 2024-05-07 DIAGNOSIS — X102XXA Contact with fats and cooking oils, initial encounter: Secondary | ICD-10-CM

## 2024-05-07 DIAGNOSIS — A419 Sepsis, unspecified organism: Secondary | ICD-10-CM | POA: Diagnosis not present

## 2024-05-07 DIAGNOSIS — R6520 Severe sepsis without septic shock: Secondary | ICD-10-CM | POA: Diagnosis not present

## 2024-05-07 LAB — BLOOD CULTURE ID PANEL (REFLEXED) - BCID2
A.calcoaceticus-baumannii: NOT DETECTED
Bacteroides fragilis: NOT DETECTED
CTX-M ESBL: NOT DETECTED
Candida albicans: NOT DETECTED
Candida auris: NOT DETECTED
Candida glabrata: NOT DETECTED
Candida krusei: NOT DETECTED
Candida parapsilosis: NOT DETECTED
Candida tropicalis: NOT DETECTED
Carbapenem resist OXA 48 LIKE: NOT DETECTED
Carbapenem resistance IMP: NOT DETECTED
Carbapenem resistance KPC: NOT DETECTED
Carbapenem resistance NDM: NOT DETECTED
Carbapenem resistance VIM: NOT DETECTED
Cryptococcus neoformans/gattii: NOT DETECTED
Enterobacter cloacae complex: NOT DETECTED
Enterobacterales: DETECTED — AB
Enterococcus Faecium: NOT DETECTED
Enterococcus faecalis: NOT DETECTED
Escherichia coli: DETECTED — AB
Haemophilus influenzae: NOT DETECTED
Klebsiella aerogenes: NOT DETECTED
Klebsiella oxytoca: NOT DETECTED
Klebsiella pneumoniae: NOT DETECTED
Listeria monocytogenes: NOT DETECTED
Meth resistant mecA/C and MREJ: NOT DETECTED
Neisseria meningitidis: NOT DETECTED
Proteus species: NOT DETECTED
Pseudomonas aeruginosa: NOT DETECTED
Salmonella species: NOT DETECTED
Serratia marcescens: NOT DETECTED
Staphylococcus aureus (BCID): DETECTED — AB
Staphylococcus epidermidis: NOT DETECTED
Staphylococcus lugdunensis: NOT DETECTED
Staphylococcus species: DETECTED — AB
Stenotrophomonas maltophilia: NOT DETECTED
Streptococcus agalactiae: NOT DETECTED
Streptococcus pneumoniae: NOT DETECTED
Streptococcus pyogenes: NOT DETECTED
Streptococcus species: NOT DETECTED

## 2024-05-07 LAB — CBC
HCT: 33.6 % — ABNORMAL LOW (ref 36.0–46.0)
Hemoglobin: 11.6 g/dL — ABNORMAL LOW (ref 12.0–15.0)
MCH: 31.8 pg (ref 26.0–34.0)
MCHC: 34.5 g/dL (ref 30.0–36.0)
MCV: 92.1 fL (ref 80.0–100.0)
Platelets: 301 K/uL (ref 150–400)
RBC: 3.65 MIL/uL — ABNORMAL LOW (ref 3.87–5.11)
RDW: 13.1 % (ref 11.5–15.5)
WBC: 21 K/uL — ABNORMAL HIGH (ref 4.0–10.5)
nRBC: 0 % (ref 0.0–0.2)

## 2024-05-07 LAB — GLUCOSE, CAPILLARY
Glucose-Capillary: 125 mg/dL — ABNORMAL HIGH (ref 70–99)
Glucose-Capillary: 102 mg/dL — ABNORMAL HIGH (ref 70–99)
Glucose-Capillary: 128 mg/dL — ABNORMAL HIGH (ref 70–99)
Glucose-Capillary: 102 mg/dL — ABNORMAL HIGH (ref 70–99)

## 2024-05-07 LAB — BASIC METABOLIC PANEL WITH GFR
Anion gap: 14 (ref 5–15)
BUN: 12 mg/dL (ref 8–23)
CO2: 21 mmol/L — ABNORMAL LOW (ref 22–32)
Calcium: 7.9 mg/dL — ABNORMAL LOW (ref 8.9–10.3)
Chloride: 99 mmol/L (ref 98–111)
Creatinine, Ser: 0.87 mg/dL (ref 0.44–1.00)
GFR, Estimated: >60 mL/min (ref >60)
Glucose, Bld: 130 mg/dL — ABNORMAL HIGH (ref 70–99)
Potassium: 3.3 mmol/L — ABNORMAL LOW (ref 3.5–5.1)
Sodium: 134 mmol/L — ABNORMAL LOW (ref 135–145)

## 2024-05-07 LAB — LACTIC ACID, PLASMA: Lactic Acid, Venous: 1.2 mmol/L (ref 0.5–1.9)

## 2024-05-07 MED ORDER — ISOSORBIDE MONONITRATE ER 30 MG PO TB24
30.0000 mg | ORAL_TABLET | Freq: Two times a day (BID) | ORAL | Status: DC
  Administered 2024-05-08 – 2024-05-14 (×13): 30 mg via ORAL
  Filled 2024-05-07 (×13): qty 1

## 2024-05-07 MED ORDER — ASPIRIN 81 MG PO TBEC
81.0000 mg | DELAYED_RELEASE_TABLET | Freq: Every day | ORAL | Status: DC
  Administered 2024-05-07 – 2024-05-14 (×8): 81 mg via ORAL
  Filled 2024-05-07 (×8): qty 1

## 2024-05-07 MED ORDER — POTASSIUM CHLORIDE 20 MEQ PO PACK
40.0000 meq | PACK | Freq: Once | ORAL | Status: AC
Start: 2024-05-07 — End: 2024-05-07
  Administered 2024-05-07: 40 meq via ORAL
  Filled 2024-05-07: qty 2

## 2024-05-07 MED ORDER — BUTALBITAL-APAP-CAFFEINE 50-325-40 MG PO TABS
1.0000 | ORAL_TABLET | Freq: Once | ORAL | Status: AC
  Administered 2024-05-07: 1 via ORAL
  Filled 2024-05-07: qty 1

## 2024-05-07 MED ORDER — METOPROLOL SUCCINATE ER 25 MG PO TB24
12.5000 mg | ORAL_TABLET | Freq: Every day | ORAL | Status: DC
  Administered 2024-05-08 – 2024-05-14 (×7): 12.5 mg via ORAL
  Filled 2024-05-07 (×7): qty 1

## 2024-05-07 NOTE — Progress Notes (Signed)
 Progress Note   Patient: [name] FMW:[id number] DOB: 1951/01/02 DOA: 05/06/2024     1 DOS: the patient was seen and examined on 05/07/2024   Brief hospital course: Ms. Mattisen Pohlmann is a 73 year old female with history of morbid obesity, neuropathy, hypothyroid, GERD, non-insulin -dependent diabetes mellitus type 2, hyperlipidemia, CAD, COPD, who presents ED from home for chief concerns of altered mentation.  Vitals in the ED showed t of 98.2, rr 21, hr 97, blood pressure 154/64, SpO2 100% on room air.  Serum sodium is 133, potassium 40.9, chloride 97, bicarb 20, BUN 15, serum creatinine 1.10, eGFR 54, nonfasting blood glucose 154, WBC 25.4, hemoglobin 12.8, platelets 378.  UA was positive for large leukocytes and positive for nitrates.  ED treatment: Cefepime , Flagyl , vancomycin , LR 1 L bolus.  Assessment and Plan: * Severe sepsis with acute organ dysfunction (HCC) E coli bacteremia  Patient with markedly elevated leukocytosis of 25.4, elevated heart rate and respiration rate, organ derangement is metabolic encephalopathy,source is likely urine, Continue with ceftriaxone  2 g IV daily to complete a 7-day course Continue to monitor white blood cell count and fever Maintain MAP greater than 65  COVID-19 virus infection Incidentally discovered. Symptomatic support: Albuterol  inhaler every 6 hours as needed for wheezing and shortness of breath  Morbid obesity (HCC) BMI 37.  This complicates overall care and prognosis.   GERD (gastroesophageal reflux disease) Hold for both today 20 mg every other day Prilosec equivalent every other day resumed on admission  CAD Hyperlipidemia associated with type 2 diabetes mellitus (HCC) Home simvastatin  40 mg nightly  Resume home imdur  and ranolazine    Hypertension Resume home Imdur   Hypothyroidism Home levothyroxine  100 mcg daily before breakfast resumed  OSA (obstructive sleep apnea) CPAP nightly ordered  Type 2 diabetes  mellitus with other specified complication (HCC) Well-controlled most recent A1c 6 Home oral antihyperglycemic agents will not be resumed on admission Insulin  SSI with at bedtime coverage ordered        Subjective: No issues overnight.   Physical Exam: Vitals:   05/06/24 1938 05/07/24 0428 05/07/24 0831 05/07/24 1454  BP: (!) 142/44 (!) 124/41 (!) 120/50 (!) 125/44  Pulse: (!) 116 98 87 89  Resp: 18 18 20 19   Temp: 99.2 F (37.3 C) 99 F (37.2 C) 98.5 F (36.9 C) 99.1 F (37.3 C)  TempSrc:    Oral  SpO2: 100% 91% 98% 99%  Weight:      Height:       Physical Exam  Constitutional: In no distress.  Cardiovascular: Normal rate, regular rhythm. No lower extremity edema  Pulmonary: Non labored breathing on room air, no wheezing or rales.   Abdominal: Soft. Non distended and non tender Musculoskeletal: Normal range of motion.     Neurological: Alert and oriented to person, place, and time. Non focal  Skin: Skin is warm and dry.   Data Reviewed:     Latest Ref Rng & Units 05/07/2024    7:10 AM 05/06/2024    2:22 PM 11/16/2023    8:05 AM  CBC  WBC 4.0 - 10.5 K/uL 21.0  25.3  9.4   Hemoglobin 12.0 - 15.0 g/dL 88.3  87.1  86.3   Hematocrit 36.0 - 46.0 % 33.6  36.8  39.7   Platelets 150 - 400 K/uL 301  378  411.0       Latest Ref Rng & Units 05/07/2024    7:10 AM 05/06/2024    2:22 PM 11/16/2023  8:05 AM  BMP  Glucose 70 - 99 mg/dL 869  845  899   BUN 8 - 23 mg/dL 12  15  10    Creatinine 0.44 - 1.00 mg/dL 9.12  8.89  9.12   Sodium 135 - 145 mmol/L 134  133  135   Potassium 3.5 - 5.1 mmol/L 3.3  3.9  4.4   Chloride 98 - 111 mmol/L 99  97  96   CO2 22 - 32 mmol/L 21  20  28    Calcium  8.9 - 10.3 mg/dL 7.9  8.8  9.9      Family Communication: None at bedside   Disposition: Status is: Inpatient Remains inpatient appropriate because: IV antibiotics   Planned Discharge Destination: Home    Time spent: 35 minutes  Author: Alban Pepper, MD 05/07/2024 6:45  PM  For on call review [email or web address].

## 2024-05-07 NOTE — Consult Note (Signed)
 NAME: [name]  DOB: 27-Apr-1951  MRN: [id number]  Date/Time: 05/07/2024 11:49 AM  REQUESTING PROVIDER: Dr. Franchot Subjective:  REASON FOR CONSULT: E. coli bacteremia and Staph aureus bacteremia ? [name] is a 73 y.o. female with a history of COPD, diabetes mellitus, hypothyroidism, hyperlipidemia, CAD status post CABG, left TKA, presented from home with chief complaints of altered mental status on 05/06/2024. She had received high-dose flu vaccine the day before. As per patient the day before she came to the hospital that night she was okay she was having a headache for the past few days mostly on the back of the head.  This felt like her usual migraine attack.  She did not have any fever or chills.  She did not have any sore throat or runny nose.  She did have some nausea. She did not have any dysuria or hematuria.  No diarrhea or abdominal pain. A few days before her presentation she had splattered hot oil on her left arm while frying chicken and that had formed multiple small blisters and it has been healing Patient exactly is not aware what happened that morning but she knew she was confused She lives with her husband and he had mentioned it to her On arrival in the ED vitals was BP 126/57, temp 98.2, pulse 103, respiratory rate 22. WBC was 25.3, Hb 12.8, platelet 378 and creatinine 1.10. Blood culture were sent. CT head with contrast showed marked bilateral ethmoid sinus disease.  No intracranial abnormality.  Chest x-ray showed hypoventilation with bronchial thickening suggestive of COPD.  Questionable hazy opacity at the left lung base versus soft tissue attenuation. UA showed multiple WBCs and nitrates positive.  UTI was suspected and she was started on antibiotics with ceftriaxone  2 g IV.  She also tested positive for COVID. Blood culture came back positive for E. coli 4 out of 4 and 1 bottle was for Staph aureus.  I am seeing the patient for the same. Patient is now awake  and alert.  She feels much better Except for the headache which is still present. No photophobia No weakness in arms or legs  Past Medical History:  Diagnosis Date   Anxiety    Arthritis    per patient all over body   Bipolar disorder [facility])    has stopped seeing psychiatrist   CAD (coronary artery disease)    a. 06/2020 MV: small, sev, apical inf/apical defect-scar vs ischemia. EF 45%; b. 06/2020 Cath: LM 59m, LAD 70ost, 63m, LCX nl, RCA nl; c. 07/2020 CABG x 1: LIMA->LAD; d. 03/2022 MV: Significantly degraded imaging due to breast attenuation/obesity/extracardiac activity. No obvious isch/scar. EF 30-35% (EF 50-55% w/o rwma on f/u echo).   Cataract    Chronic bronchitis    COPD (chronic obstructive pulmonary disease) (HCC)    Depression    Dyspnea 2012   s/p pulm/cards w/u WNL, thought due to obesity/deconditioning   Fibromyalgia    GERD (gastroesophageal reflux disease)    HFmrEF (heart failure with mid-range ejection fraction) (HCC)    a. 05/2020 Echo: EF 40%, glob HK, mild LVH, Gr1 DD; b. 06/2020 EF by SPECT: 45%; c. 07/2020 intraop TEE: EF 50%; d. 03/2022 Echo: EF 50-55%, no rwma, GrI DD, mild LVH, nl RV fxn, mild MR.   History of chicken pox    HLD (hyperlipidemia)    Hypertension    Hypothyroidism    Ischemic cardiomyopathy    a. 05/2020 Echo: EF 40%; b. 06/2020 EF by SPECT:  45%; c. 07/2020 intraop TEE: EF 50%.   Migraine    OSA (obstructive sleep apnea) 03/16/2011   Pneumonia    Sleep apnea    per patient cannot tolerate CPAP   T2DM (type 2 diabetes mellitus) (HCC) 2012   DM education 06/2011   Urinary incontinence     Past Surgical History:  Procedure Laterality Date   BREAST CYST ASPIRATION Right 2003   cardiac catherization  03/2011   x3 Linda), mild nonobstructive CAD   COLONOSCOPY  12/2011   hyperplastic polyp x2,. diverticulosis, rec rpt 10 yrs   CORONARY ARTERY BYPASS GRAFT N/A 08/03/2020   Procedure: CORONARY ARTERY BYPASS GRAFTING TIMES ONE USING LEFT  INTERNAL MAMMARY ARTERY.;  Surgeon: Lucas Dorise POUR, MD;  Location: [facility] OR;  Service: Open Heart Surgery;  Laterality: N/A;   ESOPHAGOGASTRODUODENOSCOPY  2006   ESOPHAGOGASTRODUODENOSCOPY  05/2020   no esophageal abnormality to explain dysphagia - esophagus dilated. Erosive gastropathy - neg H pylori [facility])   KNEE SURGERY  2006   left, torn meniscus   RIGHT/LEFT HEART CATH AND CORONARY ANGIOGRAPHY N/A 07/13/2020   Procedure: RIGHT/LEFT HEART CATH AND CORONARY ANGIOGRAPHY;  Surgeon: Mady Bruckner, MD;  Location: [facility] INVASIVE CV LAB;  Service: Cardiovascular;  Laterality: N/A;   TEE WITHOUT CARDIOVERSION N/A 08/03/2020   Procedure: TRANSESOPHAGEAL ECHOCARDIOGRAM (TEE);  Surgeon: Lucas Dorise POUR, MD;  Location: [facility] OR;  Service: Open Heart Surgery;  Laterality: N/A;   TOTAL ABDOMINAL HYSTERECTOMY  2004   fibroids, cervix remained   TOTAL KNEE ARTHROPLASTY Left 08/2012   [place], [place] [facility]    Social History   Socioeconomic History   Marital status: Married    Spouse name: Not on file   Number of children: Not on file   Years of education: Not on file   Highest education level: 12th grade  Occupational History   Occupation: disabled    Employer: OTHER  Tobacco Use   Smoking status: Former    Current packs/day: 0.00    Average packs/day: 2.0 packs/day for 43.0 years (86.0 ttl pk-yrs)    Types: Cigarettes    Start date: 05/18/1967    Quit date: 05/17/2010    Years since quitting: 13.9   Smokeless tobacco: Never   Tobacco comments:    QUIT 2011  Vaping Use   Vaping status: Never Used  Substance and Sexual Activity   Alcohol use: No    Alcohol/week: 0.0 standard drinks of alcohol   Drug use: No   Sexual activity: Yes    Partners: Male  Other Topics Concern   Not on file  Social History Narrative   Caffeine : 2-3 coffee/am, 1 cup tea in afternoon; Lives with husband, 1 dog, 2 cats; Occupation: disability since 2009 for bipolar, previously worked at [employer]; Diet: not many  fruits, good vegetables, good amt water .      Smoker 43 years; quit in 2011. No alcohol. Lives in [place]; with husband.    Social Drivers of Corporate investment banker Strain: Low Risk  (02/08/2024)   Overall Financial Resource Strain (CARDIA)    Difficulty of Paying Living Expenses: Not hard at all  Food Insecurity: No Food Insecurity (02/08/2024)   Hunger Vital Sign    Worried About Running Out of Food in the Last Year: Never true    Ran Out of Food in the Last Year: Never true  Transportation Needs: No Transportation Needs (02/08/2024)   PRAPARE - Administrator, Civil Service (Medical): No  Lack of Transportation (Non-Medical): No  Physical Activity: Insufficiently Active (02/08/2024)   Exercise Vital Sign    Days of Exercise per Week: 2 days    Minutes of Exercise per Session: 20 min  Stress: No Stress Concern Present (02/08/2024)   [employer] of Occupational Health - Occupational Stress Questionnaire    Feeling of Stress: Not at all  Social Connections: Moderately Isolated (02/08/2024)   Social Connection and Isolation Panel    Frequency of Communication with Friends and Family: More than three times a week    Frequency of Social Gatherings with Friends and Family: Three times a week    Attends Religious Services: Never    Active Member of Clubs or Organizations: No    Attends Banker Meetings: Never    Marital Status: Married  Catering manager Violence: Not At Risk (02/12/2024)   Humiliation, Afraid, Rape, and Kick questionnaire    Fear of Current or Ex-Partner: No    Emotionally Abused: No    Physically Abused: No    Sexually Abused: No    Family History  Problem Relation Age of Onset   Asthma Maternal Uncle    Cancer Maternal Uncle        colon   Aneurysm Maternal Uncle    Colon cancer Maternal Uncle 67   Hypertension Mother    Stroke Mother    Arthritis Mother    COPD Sister    Acute lymphoblastic leukemia Cousin 3       died  at age 28   Cancer Father 110       brain tumor   Diabetes Father    Thyroid  disease Father    Cancer Maternal Aunt 48       breast   Breast cancer Maternal Aunt    Cancer Paternal Uncle        prostate   Coronary artery disease Neg Hx    Stomach cancer Neg Hx    Rectal cancer Neg Hx    Esophageal cancer Neg Hx    Allergies  Allergen Reactions   Crestor [Rosuvastatin] Other (See Comments)    myalgias   Lipitor [Atorvastatin ] Other (See Comments)    Mood swings   Lithium Other (See Comments)    abd pain, n/v, was hospitalized   Quetiapine Other (See Comments)    Muscle twitching, muscle spasm   I? Current Facility-Administered Medications  Medication Dose Route Frequency Provider Last Rate Last Admin   0.9 %  sodium chloride  infusion   Intravenous Continuous Cox, Amy N, DO 5 mL/hr at 05/07/24 0323 Infusion Verify at 05/07/24 0323   acetaminophen  (TYLENOL ) tablet 650 mg  650 mg Oral Q6H PRN Cox, Amy N, DO       Or   acetaminophen  (TYLENOL ) suppository 650 mg  650 mg Rectal Q6H PRN Cox, Amy N, DO       albuterol  (PROVENTIL ) (2.5 MG/3ML) 0.083% nebulizer solution 2.5 mg  2.5 mg Inhalation Q6H PRN Cox, Amy N, DO       budesonide -glycopyrrolate -formoterol  (BREZTRI ) 160-9-4.8 MCG/ACT inhaler 2 puff  2 puff Inhalation BID Cox, Amy N, DO       cefTRIAXone  (ROCEPHIN ) 2 g in sodium chloride  0.9 % 100 mL IVPB  2 g Intravenous Q24H Cox, Amy N, DO   Stopped at 05/06/24 2315   famotidine  (PEPCID ) tablet 20 mg  20 mg Oral QODAY Cox, Amy N, DO   20 mg at 05/06/24 1820   gabapentin  (NEURONTIN ) capsule 300 mg  300  mg Oral BID Cox, Amy N, DO   300 mg at 05/07/24 9191   heparin  injection 5,000 Units  5,000 Units Subcutaneous Q8H Cox, Amy N, DO   5,000 Units at 05/07/24 0535   hydrALAZINE  (APRESOLINE ) injection 5 mg  5 mg Intravenous Q6H PRN Cox, Amy N, DO       insulin  aspart (novoLOG ) injection 0-20 Units  0-20 Units Subcutaneous TID WC Cox, Amy N, DO   3 Units at 05/07/24 0809   insulin  aspart  (novoLOG ) injection 0-5 Units  0-5 Units Subcutaneous QHS Cox, Amy N, DO       levothyroxine  (SYNTHROID ) tablet 100 mcg  100 mcg Oral QAC breakfast Cox, Amy N, DO   100 mcg at 05/07/24 0535   metoprolol  tartrate (LOPRESSOR ) injection 5 mg  5 mg Intravenous Q4H PRN Cox, Amy N, DO       ondansetron  (ZOFRAN ) tablet 4 mg  4 mg Oral Q6H PRN Cox, Amy N, DO       Or   ondansetron  (ZOFRAN ) injection 4 mg  4 mg Intravenous Q6H PRN Cox, Amy N, DO       pantoprazole  (PROTONIX ) EC tablet 40 mg  40 mg Oral QODAY Cox, Amy N, DO   40 mg at 05/07/24 0808   ranolazine  (RANEXA ) 12 hr tablet 500 mg  500 mg Oral BID Cox, Amy N, DO   500 mg at 05/07/24 0809   simvastatin  (ZOCOR ) tablet 40 mg  40 mg Oral QHS Cox, Amy N, DO       Facility-Administered Medications Ordered in Other Encounters  Medication Dose Route Frequency Provider Last Rate Last Admin   albuterol  (PROVENTIL ) (2.5 MG/3ML) 0.083% nebulizer solution 2.5 mg  2.5 mg Nebulization Once Tamea Dedra CROME, MD         Abtx:  Anti-infectives (From admission, onward)    Start     Dose/Rate Route Frequency Ordered Stop   05/06/24 2200  cefTRIAXone  (ROCEPHIN ) 2 g in sodium chloride  0.9 % 100 mL IVPB        2 g 200 mL/hr over 30 Minutes Intravenous Every 24 hours 05/06/24 1531 05/12/24 2159   05/06/24 1500  ceFEPIme  (MAXIPIME ) 2 g in sodium chloride  0.9 % 100 mL IVPB        2 g 200 mL/hr over 30 Minutes Intravenous  Once 05/06/24 1450 05/06/24 1522   05/06/24 1500  metroNIDAZOLE  (FLAGYL ) IVPB 500 mg        500 mg 100 mL/hr over 60 Minutes Intravenous  Once 05/06/24 1450 05/06/24 1744   05/06/24 1500  vancomycin  (VANCOCIN ) IVPB 1000 mg/200 mL premix        1,000 mg 200 mL/hr over 60 Minutes Intravenous  Once 05/06/24 1450 05/06/24 1744       REVIEW OF SYSTEMS:  Const: negative fever, negative chills, negative weight loss Eyes: negative diplopia or visual changes, negative eye pain ENT: negative coryza, negative sore throat Resp: negative cough,  hemoptysis, dyspnea Cards: negative for chest pain, palpitations, lower extremity edema GU: negative for frequency, dysuria and hematuria GI: Negative for abdominal pain, diarrhea, bleeding, constipation Skin: superficial burn wounds healing Heme: negative for easy bruising and gum/nose bleeding MS: weakness Neurolo: headaches,confusion Psych: negative for feelings of anxiety, depression  Endocrine: , diabetes Allergy/Immunology- as above Objective:  VITALS:  BP (!) 120/50 (BP Location: Right Arm)   Pulse 87   Temp 98.5 F (36.9 C)   Resp 20   Ht 5' 0.5 (1.537 m)   Wt  88 kg   SpO2 98%   BMI 37.26 kg/m   PHYSICAL EXAM:  General: Alert, cooperative, some  distress due to headache, appears stated age.  Head: Normocephalic, without obvious abnormality, atraumatic. Eyes: Conjunctivae clear, anicteric sclerae. Pupils are equal ENT Nares normal. No drainage or sinus tenderness. Lips, mucosa, and tongue normal. No Thrush edentulous Neck: Supple, symmetrical, no adenopathy, thyroid : non tender no carotid bruit and no JVD. Back: No CVA tenderness. Lungs: Clear to auscultation bilaterally. No Wheezing or Rhonchi. No rales. Heart: Regular rate and rhythm, no murmur, rub or gallop. Abdomen: Soft, non-tender,not distended. Bowel sounds normal. No masses Extremities: atraumatic, no cyanosis. No edema. No clubbing Skin:     Intertrigo groin Lymph: Cervical, supraclavicular normal. Neurologic: Grossly non-focal Pertinent Labs Lab Results CBC    Component Value Date/Time   WBC 21.0 (H) 05/07/2024 0710   RBC 3.65 (L) 05/07/2024 0710   HGB 11.6 (L) 05/07/2024 0710   HGB 11.0 (L) 08/26/2020 1439   HCT 33.6 (L) 05/07/2024 0710   HCT 33.6 (L) 08/26/2020 1439   PLT 301 05/07/2024 0710   PLT 652 (H) 08/26/2020 1439   MCV 92.1 05/07/2024 0710   MCV 91 08/26/2020 1439   MCV 95 08/30/2012 0604   MCH 31.8 05/07/2024 0710   MCHC 34.5 05/07/2024 0710   RDW 13.1 05/07/2024 0710   RDW  13.2 08/26/2020 1439   RDW 13.4 08/30/2012 0604   LYMPHSABS 1.1 05/06/2024 1422   LYMPHSABS 1.9 08/30/2012 0604   MONOABS 1.3 (H) 05/06/2024 1422   MONOABS 1.2 (H) 08/30/2012 0604   EOSABS 0.0 05/06/2024 1422   EOSABS 0.1 08/30/2012 0604   BASOSABS 0.1 05/06/2024 1422   BASOSABS 0.1 08/30/2012 0604       Latest Ref Rng & Units 05/07/2024    7:10 AM 05/06/2024    2:22 PM 11/16/2023    8:05 AM  CMP  Glucose 70 - 99 mg/dL 869  845  899   BUN 8 - 23 mg/dL 12  15  10    Creatinine 0.44 - 1.00 mg/dL 9.12  8.89  9.12   Sodium 135 - 145 mmol/L 134  133  135   Potassium 3.5 - 5.1 mmol/L 3.3  3.9  4.4   Chloride 98 - 111 mmol/L 99  97  96   CO2 22 - 32 mmol/L 21  20  28    Calcium  8.9 - 10.3 mg/dL 7.9  8.8  9.9   Total Protein 6.5 - 8.1 g/dL  7.3  7.2   Total Bilirubin 0.0 - 1.2 mg/dL  0.9  0.4   Alkaline Phos 38 - 126 U/L  42  49   AST 15 - 41 U/L  29  13   ALT 0 - 44 U/L  17  13       Microbiology: Recent Results (from the past 240 hours)  Culture, blood (routine x 2)     Status: None (Preliminary result)   Collection Time: 05/06/24  2:23 PM   Specimen: BLOOD  Result Value Ref Range Status   Specimen Description BLOOD BLOOD RIGHT HAND  Final   Special Requests   Final    BOTTLES DRAWN AEROBIC AND ANAEROBIC Blood Culture results may not be optimal due to an inadequate volume of blood received in culture bottles   Culture  Setup Time   Final    GRAM NEGATIVE RODS AEROBIC BOTTLE ONLY CRITICAL VALUE NOTED.  VALUE IS CONSISTENT WITH PREVIOUSLY REPORTED AND CALLED VALUE. Performed at  [facility], [address], [place], [place] [address]    Culture GRAM NEGATIVE RODS  Final   Report Status PENDING  Incomplete  Culture, blood (routine x 2)     Status: None (Preliminary result)   Collection Time: 05/06/24  2:23 PM   Specimen: BLOOD  Result Value Ref Range Status   Specimen Description BLOOD BLOOD LEFT HAND  Final   Special Requests   Final    BOTTLES DRAWN AEROBIC AND  ANAEROBIC Blood Culture adequate volume   Culture  Setup Time   Final    Organism ID to follow GRAM NEGATIVE RODS IN BOTH AEROBIC AND ANAEROBIC BOTTLES CRITICAL RESULT CALLED TO, READ BACK BY AND VERIFIED WITH: RANKIN DILLS PHARMD 0500 05/07/24 HNM Performed at [facility], [address], [place], [place] [address]    Culture GRAM NEGATIVE RODS  Final   Report Status PENDING  Incomplete  Resp panel by RT-PCR (RSV, Flu A&B, Covid) Anterior Nasal Swab     Status: Abnormal   Collection Time: 05/06/24  2:23 PM   Specimen: Anterior Nasal Swab  Result Value Ref Range Status   SARS Coronavirus 2 by RT PCR POSITIVE (A) NEGATIVE Final    Comment: (NOTE) SARS-CoV-2 target nucleic acids are DETECTED.  The SARS-CoV-2 RNA is generally detectable in upper respiratory specimens during the acute phase of infection. Positive results are indicative of the presence of the identified virus, but do not rule out bacterial infection or co-infection with other pathogens not detected by the test. Clinical correlation with patient history and other diagnostic information is necessary to determine patient infection status. The expected result is Negative.  Fact Sheet for Patients: BloggerCourse.com  Fact Sheet for Healthcare Providers: SeriousBroker.it  This test is not yet approved or cleared by the [place]  FDA and  has been authorized for detection and/or diagnosis of SARS-CoV-2 by FDA under an Emergency Use Authorization (EUA).  This EUA will remain in effect (meaning this test can be used) for the duration of  the COVID-19 declaration under Section 564(b)(1) of the A ct, 21 U.S.C. section 360bbb-3(b)(1), unless the authorization is terminated or revoked sooner.     Influenza A by PCR NEGATIVE NEGATIVE Final   Influenza B by PCR NEGATIVE NEGATIVE Final    Comment: (NOTE) The Xpert Xpress SARS-CoV-2/FLU/RSV plus assay is intended as  an aid in the diagnosis of influenza from Nasopharyngeal swab specimens and should not be used as a sole basis for treatment. Nasal washings and aspirates are unacceptable for Xpert Xpress SARS-CoV-2/FLU/RSV testing.  Fact Sheet for Patients: BloggerCourse.com  Fact Sheet for Healthcare Providers: SeriousBroker.it  This test is not yet approved or cleared by the [place]  FDA and has been authorized for detection and/or diagnosis of SARS-CoV-2 by FDA under an Emergency Use Authorization (EUA). This EUA will remain in effect (meaning this test can be used) for the duration of the COVID-19 declaration under Section 564(b)(1) of the Act, 21 U.S.C. section 360bbb-3(b)(1), unless the authorization is terminated or revoked.     Resp Syncytial Virus by PCR NEGATIVE NEGATIVE Final    Comment: (NOTE) Fact Sheet for Patients: BloggerCourse.com  Fact Sheet for Healthcare Providers: SeriousBroker.it  This test is not yet approved or cleared by the [place]  FDA and has been authorized for detection and/or diagnosis of SARS-CoV-2 by FDA under an Emergency Use Authorization (EUA). This EUA will remain in effect (meaning this test can be used) for the duration of the COVID-19 declaration under  Section 564(b)(1) of the Act, 21 U.S.C. section 360bbb-3(b)(1), unless the authorization is terminated or revoked.  Performed at [facility], [address] [place], [place] [address]   Blood Culture ID Panel (Reflexed)     Status: Abnormal   Collection Time: 05/06/24  2:23 PM  Result Value Ref Range Status   Enterococcus faecalis NOT DETECTED NOT DETECTED Final   Enterococcus Faecium NOT DETECTED NOT DETECTED Final   Listeria monocytogenes NOT DETECTED NOT DETECTED Final   Staphylococcus species DETECTED (A) NOT DETECTED Final    Comment: CRITICAL RESULT CALLED TO, READ BACK BY  AND VERIFIED WITH: NATHAN BELUE PHARMD 0500 05/07/24 HNM    Staphylococcus aureus (BCID) DETECTED (A) NOT DETECTED Final    Comment: CRITICAL RESULT CALLED TO, READ BACK BY AND VERIFIED WITH: NATHAN BELUE PHARMD 0500 05/07/24 HNM    Staphylococcus epidermidis NOT DETECTED NOT DETECTED Final   Staphylococcus lugdunensis NOT DETECTED NOT DETECTED Final   Streptococcus species NOT DETECTED NOT DETECTED Final   Streptococcus agalactiae NOT DETECTED NOT DETECTED Final   Streptococcus pneumoniae NOT DETECTED NOT DETECTED Final   Streptococcus pyogenes NOT DETECTED NOT DETECTED Final   A.calcoaceticus-baumannii NOT DETECTED NOT DETECTED Final   Bacteroides fragilis NOT DETECTED NOT DETECTED Final   Enterobacterales DETECTED (A) NOT DETECTED Final    Comment: Enterobacterales represent a large order of gram negative bacteria, not a single organism. CRITICAL RESULT CALLED TO, READ BACK BY AND VERIFIED WITH: NATHAN BELUE PHARMD 0500 05/07/24 HNM    Enterobacter cloacae complex NOT DETECTED NOT DETECTED Final   Escherichia coli DETECTED (A) NOT DETECTED Final    Comment: CRITICAL RESULT CALLED TO, READ BACK BY AND VERIFIED WITH: NATHAN BELUE PHARMD 0500 05/07/24 HNM    Klebsiella aerogenes NOT DETECTED NOT DETECTED Final   Klebsiella oxytoca NOT DETECTED NOT DETECTED Final   Klebsiella pneumoniae NOT DETECTED NOT DETECTED Final   Proteus species NOT DETECTED NOT DETECTED Final   Salmonella species NOT DETECTED NOT DETECTED Final   Serratia marcescens NOT DETECTED NOT DETECTED Final   Haemophilus influenzae NOT DETECTED NOT DETECTED Final   Neisseria meningitidis NOT DETECTED NOT DETECTED Final   Pseudomonas aeruginosa NOT DETECTED NOT DETECTED Final   Stenotrophomonas maltophilia NOT DETECTED NOT DETECTED Final   Candida albicans NOT DETECTED NOT DETECTED Final   Candida auris NOT DETECTED NOT DETECTED Final   Candida glabrata NOT DETECTED NOT DETECTED Final   Candida krusei NOT DETECTED NOT  DETECTED Final   Candida parapsilosis NOT DETECTED NOT DETECTED Final   Candida tropicalis NOT DETECTED NOT DETECTED Final   Cryptococcus neoformans/gattii NOT DETECTED NOT DETECTED Final   CTX-M ESBL NOT DETECTED NOT DETECTED Final   Carbapenem resistance IMP NOT DETECTED NOT DETECTED Final   Carbapenem resistance KPC NOT DETECTED NOT DETECTED Final   Meth resistant mecA/C and MREJ NOT DETECTED NOT DETECTED Final   Carbapenem resistance NDM NOT DETECTED NOT DETECTED Final   Carbapenem resist OXA 48 LIKE NOT DETECTED NOT DETECTED Final   Carbapenem resistance VIM NOT DETECTED NOT DETECTED Final    Comment: Performed at [facility], [address] [place], [place] [address]     IMAGING RESULTS: CT head no acute findings Has bilateral severe ethmoid sinus disease Chest x-ray No infiltrate I have personally reviewed the films ? Impression/Recommendation E. coli bacteremia Urine culture also has gram-negative rods Likely source is urinary tract infection even though she is asymptomatic She needs CT of the abdomen and pelvis to look  at the kidney as well as liver and to see whether there is any focus of infection Will also order a postvoid bladder scan Continue ceftriaxone   Staph aureus bacteremia 1 out of 4 low bioburden she has multiple lesions on her left arm which she says secondary to splattering of hot oil.  These are superficial and healing well.  At first blush this may look like herpes zoster but she is sure that it was following hot oil splash.  This could be the source of the Staph aureus.  Ceftriaxone  would also treat this.  Await susceptibility of the E. coli.  May be able to de-escalate to cefazolin    Encephalopathy has resolved She is completely oriented.  Meningitis is unlikely Headache She has history of migraine and seems to think this is like migraine but this could be precipitated by sepsis  COVID respiratory illness.  Not too  symptomatic  COPD  Anemia  Leukocytosis  CAD status post CABG History of left TKA  Discussed the management with the patient in detail.  This consult involve complex antimicrobial management.  I have personally spent  -75--minutes involved in face-to-face and non-face-to-face activities for this patient on the day of the visit. Professional time spent includes the following activities: Preparing to see the patient (review of tests), Obtaining and/or reviewing separately obtained history (admission/discharge record), Performing a medically appropriate examination and/or evaluation , Ordering medications/tests/procedures, referring and communicating with other health care professionals, Documenting clinical information in the EMR, Independently interpreting results (not separately reported), Communicating results to the patient/family/caregiver, Counseling and educating the patient/family/caregiver and Care coordination (not separately reported).    ________________________________________________ Note:  This document was prepared using [employer] officer, historic buildings and may include unintentional dictation errors.

## 2024-05-07 NOTE — Plan of Care (Signed)
  Problem: Education: Goal: Ability to describe self-care measures that may prevent or decrease complications (Diabetes Survival Skills Education) will improve Outcome: Progressing Goal: Individualized Educational Video(s) Outcome: Progressing   Problem: Coping: Goal: Ability to adjust to condition or change in health will improve Outcome: Progressing   Problem: Nutritional: Goal: Maintenance of adequate nutrition will improve Outcome: Progressing Goal: Progress toward achieving an optimal weight will improve Outcome: Progressing   Problem: Tissue Perfusion: Goal: Adequacy of tissue perfusion will improve Outcome: Progressing   Problem: Respiratory: Goal: Ability to maintain adequate ventilation will improve Outcome: Progressing

## 2024-05-07 NOTE — Progress Notes (Signed)
 PHARMACY - PHYSICIAN COMMUNICATION CRITICAL VALUE ALERT - BLOOD CULTURE IDENTIFICATION (BCID)  Results for orders placed or performed during the hospital encounter of 05/06/24  Culture, blood (routine x 2)     Status: None (Preliminary result)   Collection Time: 05/06/24  2:23 PM   Specimen: BLOOD  Result Value Ref Range Status   Specimen Description BLOOD BLOOD RIGHT HAND  Final   Special Requests   Final    BOTTLES DRAWN AEROBIC AND ANAEROBIC Blood Culture results may not be optimal due to an inadequate volume of blood received in culture bottles   Culture  Setup Time   Final    GRAM NEGATIVE RODS AEROBIC BOTTLE ONLY CRITICAL VALUE NOTED.  VALUE IS CONSISTENT WITH PREVIOUSLY REPORTED AND CALLED VALUE. Performed at [facility], [address] [place], [place] [address]    Culture GRAM NEGATIVE RODS  Final   Report Status PENDING  Incomplete  Culture, blood (routine x 2)     Status: None (Preliminary result)   Collection Time: 05/06/24  2:23 PM   Specimen: BLOOD  Result Value Ref Range Status   Specimen Description BLOOD BLOOD LEFT HAND  Final   Special Requests   Final    BOTTLES DRAWN AEROBIC AND ANAEROBIC Blood Culture adequate volume   Culture  Setup Time   Final    Organism ID to follow GRAM NEGATIVE RODS IN BOTH AEROBIC AND ANAEROBIC BOTTLES CRITICAL RESULT CALLED TO, READ BACK BY AND VERIFIED WITH: RANKIN DILLS PHARMD 0500 05/07/24 HNM Performed at [facility], [address], [place], [place] [address]    Culture GRAM NEGATIVE RODS  Final   Report Status PENDING  Incomplete  Resp panel by RT-PCR (RSV, Flu A&B, Covid) Anterior Nasal Swab     Status: Abnormal   Collection Time: 05/06/24  2:23 PM   Specimen: Anterior Nasal Swab  Result Value Ref Range Status   SARS Coronavirus 2 by RT PCR POSITIVE (A) NEGATIVE Final    Comment: (NOTE) SARS-CoV-2 target nucleic acids are DETECTED.  The SARS-CoV-2 RNA is generally detectable in upper  respiratory specimens during the acute phase of infection. Positive results are indicative of the presence of the identified virus, but do not rule out bacterial infection or co-infection with other pathogens not detected by the test. Clinical correlation with patient history and other diagnostic information is necessary to determine patient infection status. The expected result is Negative.  Fact Sheet for Patients: BloggerCourse.com  Fact Sheet for Healthcare Providers: SeriousBroker.it  This test is not yet approved or cleared by the [place]  FDA and  has been authorized for detection and/or diagnosis of SARS-CoV-2 by FDA under an Emergency Use Authorization (EUA).  This EUA will remain in effect (meaning this test can be used) for the duration of  the COVID-19 declaration under Section 564(b)(1) of the A ct, 21 U.S.C. section 360bbb-3(b)(1), unless the authorization is terminated or revoked sooner.     Influenza A by PCR NEGATIVE NEGATIVE Final   Influenza B by PCR NEGATIVE NEGATIVE Final    Comment: (NOTE) The Xpert Xpress SARS-CoV-2/FLU/RSV plus assay is intended as an aid in the diagnosis of influenza from Nasopharyngeal swab specimens and should not be used as a sole basis for treatment. Nasal washings and aspirates are unacceptable for Xpert Xpress SARS-CoV-2/FLU/RSV testing.  Fact Sheet for Patients: BloggerCourse.com  Fact Sheet for Healthcare Providers: SeriousBroker.it  This test is not yet approved or cleared by the [place]  FDA and has been  authorized for detection and/or diagnosis of SARS-CoV-2 by FDA under an Emergency Use Authorization (EUA). This EUA will remain in effect (meaning this test can be used) for the duration of the COVID-19 declaration under Section 564(b)(1) of the Act, 21 U.S.C. section 360bbb-3(b)(1), unless the authorization is  terminated or revoked.     Resp Syncytial Virus by PCR NEGATIVE NEGATIVE Final    Comment: (NOTE) Fact Sheet for Patients: BloggerCourse.com  Fact Sheet for Healthcare Providers: SeriousBroker.it  This test is not yet approved or cleared by the [place]  FDA and has been authorized for detection and/or diagnosis of SARS-CoV-2 by FDA under an Emergency Use Authorization (EUA). This EUA will remain in effect (meaning this test can be used) for the duration of the COVID-19 declaration under Section 564(b)(1) of the Act, 21 U.S.C. section 360bbb-3(b)(1), unless the authorization is terminated or revoked.  Performed at [facility], [address] [place], [place] [address]   Blood Culture ID Panel (Reflexed)     Status: Abnormal   Collection Time: 05/06/24  2:23 PM  Result Value Ref Range Status   Enterococcus faecalis NOT DETECTED NOT DETECTED Final   Enterococcus Faecium NOT DETECTED NOT DETECTED Final   Listeria monocytogenes NOT DETECTED NOT DETECTED Final   Staphylococcus species DETECTED (A) NOT DETECTED Final    Comment: CRITICAL RESULT CALLED TO, READ BACK BY AND VERIFIED WITH: Aleida Crandell PHARMD 0500 05/07/24 HNM    Staphylococcus aureus (BCID) DETECTED (A) NOT DETECTED Final    Comment: CRITICAL RESULT CALLED TO, READ BACK BY AND VERIFIED WITH: Marykathryn Carboni PHARMD 0500 05/07/24 HNM    Staphylococcus epidermidis NOT DETECTED NOT DETECTED Final   Staphylococcus lugdunensis NOT DETECTED NOT DETECTED Final   Streptococcus species NOT DETECTED NOT DETECTED Final   Streptococcus agalactiae NOT DETECTED NOT DETECTED Final   Streptococcus pneumoniae NOT DETECTED NOT DETECTED Final   Streptococcus pyogenes NOT DETECTED NOT DETECTED Final   A.calcoaceticus-baumannii NOT DETECTED NOT DETECTED Final   Bacteroides fragilis NOT DETECTED NOT DETECTED Final   Enterobacterales DETECTED (A) NOT DETECTED Final    Comment:  Enterobacterales represent a large order of gram negative bacteria, not a single organism. CRITICAL RESULT CALLED TO, READ BACK BY AND VERIFIED WITH: Lunabelle Oatley PHARMD 0500 05/07/24 HNM    Enterobacter cloacae complex NOT DETECTED NOT DETECTED Final   Escherichia coli DETECTED (A) NOT DETECTED Final    Comment: CRITICAL RESULT CALLED TO, READ BACK BY AND VERIFIED WITH: Toccara Alford PHARMD 0500 05/07/24 HNM    Klebsiella aerogenes NOT DETECTED NOT DETECTED Final   Klebsiella oxytoca NOT DETECTED NOT DETECTED Final   Klebsiella pneumoniae NOT DETECTED NOT DETECTED Final   Proteus species NOT DETECTED NOT DETECTED Final   Salmonella species NOT DETECTED NOT DETECTED Final   Serratia marcescens NOT DETECTED NOT DETECTED Final   Haemophilus influenzae NOT DETECTED NOT DETECTED Final   Neisseria meningitidis NOT DETECTED NOT DETECTED Final   Pseudomonas aeruginosa NOT DETECTED NOT DETECTED Final   Stenotrophomonas maltophilia NOT DETECTED NOT DETECTED Final   Candida albicans NOT DETECTED NOT DETECTED Final   Candida auris NOT DETECTED NOT DETECTED Final   Candida glabrata NOT DETECTED NOT DETECTED Final   Candida krusei NOT DETECTED NOT DETECTED Final   Candida parapsilosis NOT DETECTED NOT DETECTED Final   Candida tropicalis NOT DETECTED NOT DETECTED Final   Cryptococcus neoformans/gattii NOT DETECTED NOT DETECTED Final   CTX-M ESBL NOT DETECTED NOT DETECTED Final   Carbapenem resistance IMP NOT DETECTED  NOT DETECTED Final   Carbapenem resistance KPC NOT DETECTED NOT DETECTED Final   Meth resistant mecA/C and MREJ NOT DETECTED NOT DETECTED Final   Carbapenem resistance NDM NOT DETECTED NOT DETECTED Final   Carbapenem resist OXA 48 LIKE NOT DETECTED NOT DETECTED Final   Carbapenem resistance VIM NOT DETECTED NOT DETECTED Final    Comment: Performed at [facility], [address], [place], [place] [address]    BCID Results: 3 of 4 bottles with GNR only, however BCID resulted  with both E. Coli and Staph Aureus (no resistances detected).  Pt already on Ceftriaxone  2 gm daily.  Name of provider contacted: HILARIO Solian, MD   Changes to prescribed antibiotics required: Suspect Staph Aureus possible contaminant, so no changes at this time.  Rankin CANDIE Dills, PharmD, [facility] 05/07/2024 5:25 AM

## 2024-05-08 ENCOUNTER — Inpatient Hospital Stay

## 2024-05-08 DIAGNOSIS — N390 Urinary tract infection, site not specified: Secondary | ICD-10-CM | POA: Diagnosis not present

## 2024-05-08 DIAGNOSIS — R7881 Bacteremia: Secondary | ICD-10-CM | POA: Diagnosis not present

## 2024-05-08 DIAGNOSIS — B9620 Unspecified Escherichia coli [E. coli] as the cause of diseases classified elsewhere: Secondary | ICD-10-CM

## 2024-05-08 DIAGNOSIS — N12 Tubulo-interstitial nephritis, not specified as acute or chronic: Secondary | ICD-10-CM

## 2024-05-08 DIAGNOSIS — B9561 Methicillin susceptible Staphylococcus aureus infection as the cause of diseases classified elsewhere: Secondary | ICD-10-CM | POA: Diagnosis not present

## 2024-05-08 LAB — CBC WITH DIFFERENTIAL/PLATELET
Abs Immature Granulocytes: 0.1 K/uL — ABNORMAL HIGH (ref 0.00–0.07)
Basophils Absolute: 0 K/uL (ref 0.0–0.1)
Basophils Relative: 0 %
Eosinophils Absolute: 0 K/uL (ref 0.0–0.5)
Eosinophils Relative: 0 %
HCT: 33.6 % — ABNORMAL LOW (ref 36.0–46.0)
Hemoglobin: 11.8 g/dL — ABNORMAL LOW (ref 12.0–15.0)
Immature Granulocytes: 1 %
Lymphocytes Relative: 8 %
Lymphs Abs: 1 K/uL (ref 0.7–4.0)
MCH: 32.1 pg (ref 26.0–34.0)
MCHC: 35.1 g/dL (ref 30.0–36.0)
MCV: 91.3 fL (ref 80.0–100.0)
Monocytes Absolute: 1.2 K/uL — ABNORMAL HIGH (ref 0.1–1.0)
Monocytes Relative: 10 %
Neutro Abs: 9.4 K/uL — ABNORMAL HIGH (ref 1.7–7.7)
Neutrophils Relative %: 81 %
Platelets: 294 K/uL (ref 150–400)
RBC: 3.68 MIL/uL — ABNORMAL LOW (ref 3.87–5.11)
RDW: 13 % (ref 11.5–15.5)
WBC: 11.7 K/uL — ABNORMAL HIGH (ref 4.0–10.5)
nRBC: 0 % (ref 0.0–0.2)

## 2024-05-08 LAB — BASIC METABOLIC PANEL WITH GFR
Anion gap: 9 (ref 5–15)
BUN: 9 mg/dL (ref 8–23)
CO2: 21 mmol/L — ABNORMAL LOW (ref 22–32)
Calcium: 7.9 mg/dL — ABNORMAL LOW (ref 8.9–10.3)
Chloride: 104 mmol/L (ref 98–111)
Creatinine, Ser: 0.67 mg/dL (ref 0.44–1.00)
GFR, Estimated: >60 mL/min (ref >60)
Glucose, Bld: 107 mg/dL — ABNORMAL HIGH (ref 70–99)
Potassium: 3.6 mmol/L (ref 3.5–5.1)
Sodium: 134 mmol/L — ABNORMAL LOW (ref 135–145)

## 2024-05-08 LAB — URINE CULTURE: Culture: >=100000 — AB

## 2024-05-08 LAB — GLUCOSE, CAPILLARY
Glucose-Capillary: 117 mg/dL — ABNORMAL HIGH (ref 70–99)
Glucose-Capillary: 112 mg/dL — ABNORMAL HIGH (ref 70–99)
Glucose-Capillary: 96 mg/dL (ref 70–99)
Glucose-Capillary: 113 mg/dL — ABNORMAL HIGH (ref 70–99)

## 2024-05-08 MED ORDER — IOHEXOL 350 MG/ML SOLN
100.0000 mL | Freq: Once | INTRAVENOUS | Status: AC | PRN
  Administered 2024-05-08: 100 mL via INTRAVENOUS

## 2024-05-08 NOTE — Plan of Care (Signed)

## 2024-05-08 NOTE — Progress Notes (Addendum)
 Progress Note   Patient: [name] FMW:[id number] DOB: 03/21/1951 DOA: 05/06/2024     2 DOS: the patient was seen and examined on 05/08/2024   Brief hospital course: Ms. [name] is a 73 year old female with history of morbid obesity, neuropathy, hypothyroid, GERD, non-insulin -dependent diabetes mellitus type 2, hyperlipidemia, CAD, COPD, who presents ED from home for chief concerns of altered mentation.  Vitals in the ED showed t of 98.2, rr 21, hr 97, blood pressure 154/64, SpO2 100% on room air.  Serum sodium is 133, potassium 40.9, chloride 97, bicarb 20, BUN 15, serum creatinine 1.10, eGFR 54, nonfasting blood glucose 154, WBC 25.4, hemoglobin 12.8, platelets 378.  UA was positive for large leukocytes and positive for nitrates.  ED treatment: Cefepime , Flagyl , vancomycin , LR 1 L bolus.  Assessment and Plan: * Severe sepsis with acute organ dysfunction Resolved R sided pyelonephritis  E coli bacteremia  MSSA bacteremia  Patient with markedly elevated leukocytosis of 25.4, elevated heart rate and respiration rate, organ derangement is metabolic encephalopathy,source is likely urine. Sepsis physiology resolved.  Bcx w/ e coli + MSSA  UCX with ecoli  MSSA likely from wounds on extremities   Continue with ceftriaxone  2 g IV per ID F/u CT A/P to look for source of bacteremia  F/u TTE given MSSA  Continue to monitor white blood cell count and fever Maintain MAP greater than 65  COVID-19 virus infection Incidentally discovered. Symptomatic support: Albuterol  inhaler every 6 hours as needed for wheezing and shortness of breath  Morbid obesity (HCC) BMI 37.  This complicates overall care and prognosis.   Mild hyponatremia     Latest Ref Rng & Units 05/08/2024    5:15 AM 05/07/2024    7:10 AM 05/06/2024    2:22 PM  BMP  Glucose 70 - 99 mg/dL 892  869  845   BUN 8 - 23 mg/dL 9  12  15    Creatinine 0.44 - 1.00 mg/dL 9.32  9.12  8.89   Sodium 135 - 145 mmol/L 134   134  133   Potassium 3.5 - 5.1 mmol/L 3.6  3.3  3.9   Chloride 98 - 111 mmol/L 104  99  97   CO2 22 - 32 mmol/L 21  21  20    Calcium  8.9 - 10.3 mg/dL 7.9  7.9  8.8    Stable likely in the setting of sepsis and hypovolemic. Improved with IV fluids. Continue to encourage PO.   GERD (gastroesophageal reflux disease) Hold for both today 20 mg every other day Prilosec equivalent every other day resumed on admission  CAD Hyperlipidemia associated with type 2 diabetes mellitus (HCC) Home simvastatin  40 mg nightly  Resume home imdur  and ranolazine    Hypertension home Imdur   Hypothyroidism Home levothyroxine  100 mcg daily before breakfast  OSA (obstructive sleep apnea) CPAP nightly ordered  Type 2 diabetes mellitus with other specified complication (HCC) Well-controlled most recent A1c 6 Home oral antihyperglycemic agents will not be resumed on admission Insulin  SSI with at bedtime coverage ordered        Subjective: No issues overnight.   Physical Exam: Vitals:   05/07/24 2009 05/08/24 0507 05/08/24 0745 05/08/24 1626  BP: (!) 127/59 (!) 126/50 134/63 107/66  Pulse: 89 82 75 73  Resp: 18 18 16 16   Temp: 98.7 F (37.1 C) 98.9 F (37.2 C) 98.3 F (36.8 C) 98.5 F (36.9 C)  TempSrc:      SpO2: 100% 96% 97% 97%  Weight:  Height:       Physical Exam  Constitutional: In no distress.  Cardiovascular: Normal rate, regular rhythm. No lower extremity edema  Pulmonary: Non labored breathing on room air, no wheezing or rales.   Abdominal: Soft. Non distended and non tender Musculoskeletal: Normal range of motion.     Neurological: Alert and oriented to person, place, and time. Non focal  Skin: Skin is warm and dry.   Data Reviewed:     Latest Ref Rng & Units 05/08/2024    5:15 AM 05/07/2024    7:10 AM 05/06/2024    2:22 PM  CBC  WBC 4.0 - 10.5 K/uL 11.7  21.0  25.3   Hemoglobin 12.0 - 15.0 g/dL 88.1  88.3  87.1   Hematocrit 36.0 - 46.0 % 33.6  33.6  36.8    Platelets 150 - 400 K/uL 294  301  378       Latest Ref Rng & Units 05/08/2024    5:15 AM 05/07/2024    7:10 AM 05/06/2024    2:22 PM  BMP  Glucose 70 - 99 mg/dL 892  869  845   BUN 8 - 23 mg/dL 9  12  15    Creatinine 0.44 - 1.00 mg/dL 9.32  9.12  8.89   Sodium 135 - 145 mmol/L 134  134  133   Potassium 3.5 - 5.1 mmol/L 3.6  3.3  3.9   Chloride 98 - 111 mmol/L 104  99  97   CO2 22 - 32 mmol/L 21  21  20    Calcium  8.9 - 10.3 mg/dL 7.9  7.9  8.8        Latest Ref Rng & Units 05/08/2024    5:15 AM 05/07/2024    7:10 AM 05/06/2024    2:22 PM  CBC  WBC 4.0 - 10.5 K/uL 11.7  21.0  25.3   Hemoglobin 12.0 - 15.0 g/dL 88.1  88.3  87.1   Hematocrit 36.0 - 46.0 % 33.6  33.6  36.8   Platelets 150 - 400 K/uL 294  301  378     Family Communication: None at bedside   Disposition: Status is: Inpatient Remains inpatient appropriate because: IV antibiotics   Planned Discharge Destination: Home    Time spent: 35 minutes  Author: Alban Pepper, MD 05/08/2024 6:41 PM  For on call review [email or web address].

## 2024-05-08 NOTE — Progress Notes (Addendum)
 Date of Admission:  05/06/2024      ID: [name] is a 73 y.o. female  Principal Problem:   Severe sepsis with acute organ dysfunction (HCC) Active Problems:   Type 2 diabetes mellitus with other specified complication (HCC)   OSA (obstructive sleep apnea)   CAD (coronary artery disease)   Hypothyroidism   Hypertension   Bipolar disorder (HCC)   Hyperlipidemia associated with type 2 diabetes mellitus (HCC)   GERD (gastroesophageal reflux disease)   Hepatic steatosis   S/P CABG x 1   Morbid obesity (HCC)   COVID-19 virus infection   Pressure injury of skin    Subjective: Patient is doing well Headache is much better No fever Medications:   aspirin  EC  81 mg Oral Daily   budesonide -glycopyrrolate -formoterol   2 puff Inhalation BID   famotidine   20 mg Oral QODAY   gabapentin   300 mg Oral BID   heparin   5,000 Units Subcutaneous Q8H   insulin  aspart  0-20 Units Subcutaneous TID WC   insulin  aspart  0-5 Units Subcutaneous QHS   isosorbide  mononitrate  30 mg Oral BID   levothyroxine   100 mcg Oral QAC breakfast   metoprolol  succinate  12.5 mg Oral Daily   pantoprazole   40 mg Oral QODAY   ranolazine   500 mg Oral BID   simvastatin   40 mg Oral QHS    Objective: Vital signs in last 24 hours: Patient Vitals for the past 24 hrs:  BP Temp Temp src Pulse Resp SpO2  05/08/24 0745 134/63 98.3 F (36.8 C) -- 75 16 97 %  05/08/24 0507 (!) 126/50 98.9 F (37.2 C) -- 82 18 96 %  05/07/24 2009 (!) 127/59 98.7 F (37.1 C) -- 89 18 100 %  05/07/24 1454 (!) 125/44 99.1 F (37.3 C) Oral 89 19 99 %      PHYSICAL EXAM:  General: Alert, cooperative, no distress, appears stated age.   Back: No CVA tenderness. Lungs: Clear to auscultation bilaterally. No Wheezing or Rhonchi. No rales. Heart: Regular rate and rhythm, no murmur, rub or gallop. Abdomen: Soft, hernia  extremities: Left forearm multiple healing wounds Skin: As above lymph: Cervical, supraclavicular  normal. Neurologic: Grossly non-focal  Lab Results    Latest Ref Rng & Units 05/08/2024    5:15 AM 05/07/2024    7:10 AM 05/06/2024    2:22 PM  CBC  WBC 4.0 - 10.5 K/uL 11.7  21.0  25.3   Hemoglobin 12.0 - 15.0 g/dL 88.1  88.3  87.1   Hematocrit 36.0 - 46.0 % 33.6  33.6  36.8   Platelets 150 - 400 K/uL 294  301  378        Latest Ref Rng & Units 05/08/2024    5:15 AM 05/07/2024    7:10 AM 05/06/2024    2:22 PM  CMP  Glucose 70 - 99 mg/dL 892  869  845   BUN 8 - 23 mg/dL 9  12  15    Creatinine 0.44 - 1.00 mg/dL 9.32  9.12  8.89   Sodium 135 - 145 mmol/L 134  134  133   Potassium 3.5 - 5.1 mmol/L 3.6  3.3  3.9   Chloride 98 - 111 mmol/L 104  99  97   CO2 22 - 32 mmol/L 21  21  20    Calcium  8.9 - 10.3 mg/dL 7.9  7.9  8.8   Total Protein 6.5 - 8.1 g/dL   7.3   Total Bilirubin  0.0 - 1.2 mg/dL   0.9   Alkaline Phos 38 - 126 U/L   42   AST 15 - 41 U/L   29   ALT 0 - 44 U/L   17       Microbiology: 05/06/2024 blood culture both sets as E. coli And once that has Staph aureus I  Studies/Results: DG Chest Portable 1 View Result Date: 05/06/2024 CLINICAL DATA:  Sepsis. EXAM: PORTABLE CHEST 1 VIEW COMPARISON:  02/23/2022, CT 01/28/2024 FINDINGS: Prior median sternotomy. The heart is borderline enlarged. Aortic atherosclerosis. The lungs are hyperinflated with bronchial thickening. Questionable hazy opacity at the left lung base versus soft tissue attenuation. No pneumothorax. On limited assessment, no acute osseous findings. IMPRESSION: 1. Hyperinflation with bronchial thickening, suggesting COPD. 2. Questionable hazy opacity at the left lung base versus soft tissue attenuation. Recommend PA and lateral radiograph for further assessment when patient is able. Electronically Signed   By: Andrea Gasman M.D.   On: 05/06/2024 15:50   CT Head Wo Contrast Result Date: 05/06/2024 CLINICAL DATA:  Altered mental status. EXAM: CT HEAD WITHOUT CONTRAST TECHNIQUE: Contiguous axial images were  obtained from the base of the skull through the vertex without intravenous contrast. RADIATION DOSE REDUCTION: This exam was performed according to the departmental dose-optimization program which includes automated exposure control, adjustment of the mA and/or kV according to patient size and/or use of iterative reconstruction technique. COMPARISON:  May 09, 2020 FINDINGS: Brain: No evidence of acute infarction, hemorrhage, hydrocephalus, extra-axial collection or mass lesion/mass effect. Vascular: Marked severity bilateral cavernous carotid artery calcification is noted. Skull: Normal. Negative for fracture or focal lesion. Sinuses/Orbits: There is marked severity bilateral ethmoid sinus and mild left maxillary sinus mucosal thickening. Other: None. IMPRESSION: 1. No acute intracranial abnormality. 2. Marked severity bilateral ethmoid sinus and mild left maxillary sinus disease. Electronically Signed   By: Suzen Dials M.D.   On: 05/06/2024 15:11     Assessment/Plan: E. coli bacteremia secondary to E. coli complicated UTI Right pyelonephritis in CAT scan Get a postvoid bladder scan Patient is currently on ceftriaxone  we will change to cefazolin  to cover the Staph aureus as well.  Staph aureus bacteremia 1 out of 4 Low bioburden Likely source is the left arm wounds  These wounds were secondary to splattering of hot oil as per patient.   Culture has been sent She was on ceftriaxone  until today will change to cefazolin  Will repeat blood culture Will get a 2D echo The risk for endocarditis is very low.  Encephalopathy is resolved  Headache likely due to migraine Much better  COVID respiratory illness minimally symptomatic  COPD  Anemia  Leukocytosis is resolved  CAD status post CABG  History of left TKA  Discussed the management with the patient in detail.  Discussed with the hospitalist.

## 2024-05-09 ENCOUNTER — Inpatient Hospital Stay (HOSPITAL_COMMUNITY)
Admit: 2024-05-09 | Discharge: 2024-05-09 | Disposition: A | Discharge status: Home / Self Care | Attending: Infectious Diseases | Admitting: Infectious Diseases

## 2024-05-09 ENCOUNTER — Ambulatory Visit: Admitting: Family Medicine

## 2024-05-09 ENCOUNTER — Inpatient Hospital Stay: Admit: 2024-05-09

## 2024-05-09 DIAGNOSIS — N12 Tubulo-interstitial nephritis, not specified as acute or chronic: Secondary | ICD-10-CM | POA: Diagnosis not present

## 2024-05-09 DIAGNOSIS — R7881 Bacteremia: Secondary | ICD-10-CM | POA: Diagnosis not present

## 2024-05-09 DIAGNOSIS — N390 Urinary tract infection, site not specified: Secondary | ICD-10-CM | POA: Diagnosis not present

## 2024-05-09 DIAGNOSIS — B9620 Unspecified Escherichia coli [E. coli] as the cause of diseases classified elsewhere: Secondary | ICD-10-CM | POA: Diagnosis not present

## 2024-05-09 LAB — ECHOCARDIOGRAM COMPLETE
AR max vel: 2.06 cm2
AV Area VTI: 2.63 cm2
AV Area mean vel: 1.99 cm2
AV Mean grad: 3 mmHg
AV Peak grad: 6 mmHg
Ao pk vel: 1.22 m/s
Area-P 1/2: 3.17 cm2
Calc EF: 48.3 %
Height: 60.5 in
MV VTI: 1.64 cm2
S' Lateral: 3 cm
Single Plane A2C EF: 31 %
Single Plane A4C EF: 58.9 %
Weight: 3104 [oz_av]

## 2024-05-09 LAB — GLUCOSE, CAPILLARY
Glucose-Capillary: 106 mg/dL — ABNORMAL HIGH (ref 70–99)
Glucose-Capillary: 138 mg/dL — ABNORMAL HIGH (ref 70–99)
Glucose-Capillary: 94 mg/dL (ref 70–99)
Glucose-Capillary: 113 mg/dL — ABNORMAL HIGH (ref 70–99)

## 2024-05-09 LAB — BASIC METABOLIC PANEL WITH GFR
Anion gap: 16 — ABNORMAL HIGH (ref 5–15)
BUN: 8 mg/dL (ref 8–23)
CO2: 21 mmol/L — ABNORMAL LOW (ref 22–32)
Calcium: 8.5 mg/dL — ABNORMAL LOW (ref 8.9–10.3)
Chloride: 101 mmol/L (ref 98–111)
Creatinine, Ser: 0.65 mg/dL (ref 0.44–1.00)
GFR, Estimated: >60 mL/min (ref >60)
Glucose, Bld: 108 mg/dL — ABNORMAL HIGH (ref 70–99)
Potassium: 3.6 mmol/L (ref 3.5–5.1)
Sodium: 138 mmol/L (ref 135–145)

## 2024-05-09 LAB — CULTURE, BLOOD (ROUTINE X 2)

## 2024-05-09 LAB — CBC
HCT: 34.2 % — ABNORMAL LOW (ref 36.0–46.0)
Hemoglobin: 11.6 g/dL — ABNORMAL LOW (ref 12.0–15.0)
MCH: 31.2 pg (ref 26.0–34.0)
MCHC: 33.9 g/dL (ref 30.0–36.0)
MCV: 91.9 fL (ref 80.0–100.0)
Platelets: 351 K/uL (ref 150–400)
RBC: 3.72 MIL/uL — ABNORMAL LOW (ref 3.87–5.11)
RDW: 13.3 % (ref 11.5–15.5)
WBC: 9.9 K/uL (ref 4.0–10.5)
nRBC: 0 % (ref 0.0–0.2)

## 2024-05-09 MED ORDER — CEFAZOLIN SODIUM-DEXTROSE 2-4 GM/100ML-% IV SOLN
2.0000 g | Freq: Three times a day (TID) | INTRAVENOUS | Status: DC
  Administered 2024-05-09 – 2024-05-14 (×16): 2 g via INTRAVENOUS
  Filled 2024-05-09 (×16): qty 100

## 2024-05-09 MED ORDER — ZIPRASIDONE HCL 40 MG PO CAPS
40.0000 mg | ORAL_CAPSULE | Freq: Two times a day (BID) | ORAL | Status: DC
  Administered 2024-05-09 – 2024-05-14 (×10): 40 mg via ORAL
  Filled 2024-05-09 (×12): qty 1

## 2024-05-09 NOTE — Progress Notes (Addendum)
 Progress Note   Patient: [name] DOB: [birth year] DOA: 05/06/2024     3 DOS: the patient was seen and examined on 05/09/2024   Brief hospital course: [name] is a 73 year old female with history of morbid obesity, neuropathy, hypothyroid, GERD, non-insulin -dependent diabetes mellitus type 2, hyperlipidemia, CAD, COPD, who presents ED from home for chief concerns of altered mentation.  Vitals in the ED showed t of 98.2, rr 21, hr 97, blood pressure 154/64, SpO2 100% on room air.  Serum sodium is 133, potassium 40.9, chloride 97, bicarb 20, BUN 15, serum creatinine 1.10, eGFR 54, nonfasting blood glucose 154, WBC 25.4, hemoglobin 12.8, platelets 378.  UA was positive for large leukocytes and positive for nitrates.  ED treatment: Cefepime , Flagyl , vancomycin , LR 1 L bolus.  Assessment and Plan: * Severe sepsis with acute organ dysfunction Resolved R sided pyelonephritis  E coli bacteremia  MSSA bacteremia  Patient with markedly elevated leukocytosis of 25.4, elevated heart rate and respiration rate, organ derangement is metabolic encephalopathy,source is likely urine. Sepsis physiology resolved.  Bcx w/ e coli + MSSA  UCX with ecoli  MSSA likely from wounds on extremities  TTE without evidence of valve vegetation  Continue cefazolin  Continue to monitor white blood cell count and fever Maintain MAP greater than 65  COVID-19 virus infection Incidentally discovered. Symptomatic support: Albuterol  inhaler every 6 hours as needed for wheezing and shortness of breath  Morbid obesity (HCC) BMI 37.  This complicates overall care and prognosis.   Mild hyponatremia     Latest Ref Rng & Units 05/09/2024    7:56 AM 05/08/2024    5:15 AM 05/07/2024    7:10 AM  BMP  Glucose 70 - 99 mg/dL 891  892  869   BUN 8 - 23 mg/dL 8  9  12    Creatinine 0.44 - 1.00 mg/dL 9.34  9.32  9.12   Sodium 135 - 145 mmol/L 138  134  134   Potassium 3.5 - 5.1 mmol/L 3.6  3.6   3.3   Chloride 98 - 111 mmol/L 101  104  99   CO2 22 - 32 mmol/L 21  21  21    Calcium  8.9 - 10.3 mg/dL 8.5  7.9  7.9    Resolved  Mild metabolic acidosis  Patient's bicarb 20 and AG 16 on admission. Lactic acid mildly elevated likely component of starvation ketosis. This improved with fluids. Continue to encourage PO intake. Will continue to monitor.    AKI Resolved.  Patient's scr 1.1 on admission. Now normalized.   GERD (gastroesophageal reflux disease) Hold for both today 20 mg every other day Prilosec equivalent every other day resumed on admission  CAD Hyperlipidemia associated with type 2 diabetes mellitus (HCC) Home simvastatin  40 mg nightly  Continue home imdur  and ranolazine    Hypertension home Imdur   Hypothyroidism Home levothyroxine  100 mcg daily before breakfast  OSA (obstructive sleep apnea) CPAP nightly ordered  Type 2 diabetes mellitus with other specified complication (HCC) Well-controlled most recent A1c 6 Home oral antihyperglycemic agents will not be resumed on admission Insulin  SSI with at bedtime coverage ordered        Subjective: No issues overnight.   Physical Exam: Vitals:   05/09/24 0759 05/09/24 0837 05/09/24 1100 05/09/24 1539  BP: (!) 140/63 (!) 121/53  137/65  Pulse: 71 75  75  Resp: 17   17  Temp: 98.2 F (36.8 C)   98 F (36.7 C)  TempSrc: Oral  SpO2: 98%  95% 96%  Weight:      Height:        Constitutional: In no distress.  Cardiovascular: Normal rate, regular rhythm. No lower extremity edema  Pulmonary: Non labored breathing on room air, no wheezing or rales.   Abdominal: Soft. Non distended and non tender Musculoskeletal: Normal range of motion.     Neurological: Alert and oriented to person, place, and time. Non focal  Skin: Skin is warm and dry.    Data Reviewed:     Latest Ref Rng & Units 05/09/2024    7:56 AM 05/08/2024    5:15 AM 05/07/2024    7:10 AM  CBC  WBC 4.0 - 10.5 K/uL 9.9  11.7  21.0    Hemoglobin 12.0 - 15.0 g/dL 88.3  88.1  88.3   Hematocrit 36.0 - 46.0 % 34.2  33.6  33.6   Platelets 150 - 400 K/uL 351  294  301       Latest Ref Rng & Units 05/09/2024    7:56 AM 05/08/2024    5:15 AM 05/07/2024    7:10 AM  BMP  Glucose 70 - 99 mg/dL 891  892  869   BUN 8 - 23 mg/dL 8  9  12    Creatinine 0.44 - 1.00 mg/dL 9.34  9.32  9.12   Sodium 135 - 145 mmol/L 138  134  134   Potassium 3.5 - 5.1 mmol/L 3.6  3.6  3.3   Chloride 98 - 111 mmol/L 101  104  99   CO2 22 - 32 mmol/L 21  21  21    Calcium  8.9 - 10.3 mg/dL 8.5  7.9  7.9        Latest Ref Rng & Units 05/09/2024    7:56 AM 05/08/2024    5:15 AM 05/07/2024    7:10 AM  CBC  WBC 4.0 - 10.5 K/uL 9.9  11.7  21.0   Hemoglobin 12.0 - 15.0 g/dL 88.3  88.1  88.3   Hematocrit 36.0 - 46.0 % 34.2  33.6  33.6   Platelets 150 - 400 K/uL 351  294  301     Family Communication: None at bedside   Disposition: Status is: Inpatient Remains inpatient appropriate because: IV antibiotics   Planned Discharge Destination: Home    Time spent: 35 minutes  Author: Alban Pepper, MD 05/09/2024 7:19 PM  For on call review [email or web address].

## 2024-05-09 NOTE — Progress Notes (Signed)
 Date of Admission:  05/06/2024      ID: [name] is a 73 y.o. female  Principal Problem:   Severe sepsis with acute organ dysfunction (HCC) Active Problems:   Type 2 diabetes mellitus with other specified complication (HCC)   OSA (obstructive sleep apnea)   CAD (coronary artery disease)   Hypothyroidism   Hypertension   Bipolar disorder (HCC)   Hyperlipidemia associated with type 2 diabetes mellitus (HCC)   GERD (gastroesophageal reflux disease)   Hepatic steatosis   S/P CABG x 1   Morbid obesity (HCC)   COVID-19 virus infection   Pressure injury of skin   E coli bacteremia   MSSA bacteremia   Pyelonephritis   Complicated UTI (urinary tract infection)    Subjective: Patient is doing better Headache is much better No fever Medications:   aspirin  EC  81 mg Oral Daily   budesonide -glycopyrrolate -formoterol   2 puff Inhalation BID   famotidine   20 mg Oral QODAY   gabapentin   300 mg Oral BID   heparin   5,000 Units Subcutaneous Q8H   insulin  aspart  0-20 Units Subcutaneous TID WC   insulin  aspart  0-5 Units Subcutaneous QHS   isosorbide  mononitrate  30 mg Oral BID   levothyroxine   100 mcg Oral QAC breakfast   metoprolol  succinate  12.5 mg Oral Daily   pantoprazole   40 mg Oral QODAY   ranolazine   500 mg Oral BID   simvastatin   40 mg Oral QHS   ziprasidone   40 mg Oral BID WC    Objective: Vital signs in last 24 hours: Patient Vitals for the past 24 hrs:  BP Temp Temp src Pulse Resp SpO2  05/09/24 1100 -- -- -- -- -- 95 %  05/09/24 0837 (!) 121/53 -- -- 75 -- --  05/09/24 0759 (!) 140/63 98.2 F (36.8 C) Oral 71 17 98 %  05/09/24 0348 (!) 113/50 98 F (36.7 C) Oral 70 16 96 %  05/08/24 2028 (!) 113/57 98.3 F (36.8 C) -- 70 16 96 %  05/08/24 1626 107/66 98.5 F (36.9 C) -- 73 16 97 %      PHYSICAL EXAM:  General: Alert, cooperative, no distress, appears stated age.   Back: No CVA tenderness. Lungs: Clear to auscultation bilaterally. No Wheezing or  Rhonchi. No rales. Heart: Regular rate and rhythm, no murmur, rub or gallop. Abdomen: Soft, hernia  extremities: Left forearm multiple healing wounds Skin: As above lymph: Cervical, supraclavicular normal. Neurologic: Grossly non-focal  Lab Results    Latest Ref Rng & Units 05/09/2024    7:56 AM 05/08/2024    5:15 AM 05/07/2024    7:10 AM  CBC  WBC 4.0 - 10.5 K/uL 9.9  11.7  21.0   Hemoglobin 12.0 - 15.0 g/dL 88.3  88.1  88.3   Hematocrit 36.0 - 46.0 % 34.2  33.6  33.6   Platelets 150 - 400 K/uL 351  294  301        Latest Ref Rng & Units 05/09/2024    7:56 AM 05/08/2024    5:15 AM 05/07/2024    7:10 AM  CMP  Glucose 70 - 99 mg/dL 891  892  869   BUN 8 - 23 mg/dL 8  9  12    Creatinine 0.44 - 1.00 mg/dL 9.34  9.32  9.12   Sodium 135 - 145 mmol/L 138  134  134   Potassium 3.5 - 5.1 mmol/L 3.6  3.6  3.3   Chloride  98 - 111 mmol/L 101  104  99   CO2 22 - 32 mmol/L 21  21  21    Calcium  8.9 - 10.3 mg/dL 8.5  7.9  7.9       Microbiology: 05/06/2024 blood culture both sets as E. coli And once that has Staph aureus on BCID but not in culture yet  Studies/Results: CT ABDOMEN PELVIS W CONTRAST Result Date: 05/08/2024 CLINICAL DATA:  Sepsis. EXAM: CT ABDOMEN AND PELVIS WITH CONTRAST TECHNIQUE: Multidetector CT imaging of the abdomen and pelvis was performed using the standard protocol following bolus administration of intravenous contrast. RADIATION DOSE REDUCTION: This exam was performed according to the departmental dose-optimization program which includes automated exposure control, adjustment of the mA and/or kV according to patient size and/or use of iterative reconstruction technique. CONTRAST:  OMNIPAQUE  IOHEXOL  350 MG/ML SOLN COMPARISON:  CT abdomen pelvis dated 10/07/2020. FINDINGS: Lower chest: Bibasilar linear atelectasis/scarring. No intra-abdominal free air or free fluid. Hepatobiliary: The liver is unremarkable. No biliary dilatation. The gallbladder is unremarkable  Pancreas: Unremarkable. No pancreatic ductal dilatation or surrounding inflammatory changes. Spleen: Normal in size without focal abnormality. Adrenals/Urinary Tract: The adrenal glands unremarkable. There is no hydronephrosis on either side. There is heterogeneous enhancement of the right renal parenchyma consistent with pyelonephritis. Correlation with urinalysis recommended. No abscess. The visualized ureters and urinary bladder appear unremarkable. Stomach/Bowel: There is sigmoid diverticulosis and scattered colonic diverticula. There is no bowel obstruction or active inflammation. The appendix is normal. Vascular/Lymphatic: Moderate aortoiliac atherosclerotic disease. The IVC is unremarkable. No portal venous gas. There is no adenopathy. Reproductive: No suspicious adnexal masses. Other: Small fat containing supraumbilical hernia. Musculoskeletal: Osteopenia with degenerative changes of spine. No acute osseous pathology. IMPRESSION: 1. Right-sided pyelonephritis. No abscess. 2. Colonic diverticulosis. No bowel obstruction. Normal appendix. 3.  Aortic Atherosclerosis (ICD10-I70.0). Electronically Signed   By: Vanetta Chou M.D.   On: 05/08/2024 18:23     Assessment/Plan: E. coli bacteremia secondary to E. coli complicated UTI Right pyelonephritis on CT scan Get a postvoid bladder scan Patient is currently on cefazolin   Staph aureus bacteremia 1 out of 4 only by BCID but not in  culture.  Very Low bioburden Likely source is the left arm wounds  These wounds were secondary to splattering of hot oil as per patient.   Culture has been sent Pt on cefazolin   repeat blood culture sent  Will get a 2D echo The risk for endocarditis is very low.so TEE not needed Depending on the first culture result we can finalize on discharge antibiotic- If no MSSA cultured then she could do Po antibiotic like quinolone X 7 days If MSSA in blood culture then she will need IV cefazolin  for a total of 2  weeks  Encephalopathy is resolved  Headache likely due to migraine Much better  COVID respiratory illness minimally symptomatic  COPD  Anemia  Leukocytosis  resolved  CAD status post CABG  History of left TKA  Discussed the management with the patient in detail.  Discussed with the hospitalist. ID will not see her routinely this weekend On call ID available for urgent issues by phone

## 2024-05-09 NOTE — Evaluation (Signed)
 Physical Therapy Evaluation Patient Details Name: [name] MRN: [id number] DOB: Jun 25, 1951 Today's Date: 05/09/2024  History of Present Illness  presented to ER secondary to AMS; admitted for management of severe sepsis, acute metabolic encephalopathy related to UTI, Covid-19 virus.  Clinical Impression  Patient resting in bed upon arrival; alert and oriented, follows commands and agreeable to participation with session.  Eager for OOB to chair and progressive mobilization.  Denies pain. Bilat UE/LE strength and ROM grossly symmetrical and WFL; no focal weakness appreciated.  Able to complete bed mobility with sup; sit/stand, basic transfers and gait (25') with RW, cga/min assist.  Demonstrates mild forward trunk flexion, partially reciprocal stepping pattern with decreased step height/length; mod reliance on RW for support and overall energy conservation. Moderate SOB with minimal gait distance, BORG 7-8/10; sats >95% on RA Would benefit from skilled PT to address above deficits and promote optimal return to PLOF.; recommend post-acute PT follow up as indicated by interdisciplinary care team.          If plan is discharge home, recommend the following: A little help with walking and/or transfers;A little help with bathing/dressing/bathroom   Can travel by private vehicle        Equipment Recommendations    Recommendations for Other Services       Functional Status Assessment Patient has had a recent decline in their functional status and demonstrates the ability to make significant improvements in function in a reasonable and predictable amount of time.     Precautions / Restrictions Precautions Precautions: Fall Restrictions Weight Bearing Restrictions Per Provider Order: No      Mobility  Bed Mobility Overal bed mobility: Needs Assistance Bed Mobility: Supine to Sit     Supine to sit: Supervision, HOB elevated, Used rails          Transfers Overall transfer  level: Needs assistance Equipment used: Rolling walker (2 wheels) Transfers: Sit to/from Stand, Bed to chair/wheelchair/BSC Sit to Stand: Min assist Stand pivot transfers: Min assist         General transfer comment: tends to pull on RW despite cuing, does require UE support to complete    Ambulation/Gait Ambulation/Gait assistance: Min assist Gait Distance (Feet): 25 Feet Assistive device: Rolling walker (2 wheels)         General Gait Details: mild forward trunk flexion, partially reciprocal stepping pattern with decreased step height/length; mod reliance on RW for support and overall energy conservation. Moderate SOB with minimal gait distance, BORG 7-8/10; sats >95% on RA  Stairs            Wheelchair Mobility     Tilt Bed    Modified Rankin (Stroke Patients Only)       Balance Overall balance assessment: Needs assistance Sitting-balance support: No upper extremity supported, Feet supported Sitting balance-Leahy Scale: Good     Standing balance support: Bilateral upper extremity supported Standing balance-Leahy Scale: Fair                               Pertinent Vitals/Pain Pain Assessment Pain Assessment: No/denies pain    Home Living Family/patient expects to be discharged to:: Private residence Living Arrangements: Spouse/significant other Available Help at Discharge: Family Type of Home: Mobile home Home Access: Ramped entrance       Home Layout: One level Home Equipment: Rollator (4 wheels);Cane - single point;BSC/3in1      Prior Function Prior Level of Function : Independent/Modified  Independent             Mobility Comments: Mod indep with 4WRW for household and limited community mobilization; no home O2; denies fall history.  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), requiring assist primary for iADLs (minimal/no physical assist required from her)       Extremity/Trunk Assessment   Upper Extremity Assessment Upper  Extremity Assessment: Overall WFL for tasks assessed    Lower Extremity Assessment Lower Extremity Assessment: Overall WFL for tasks assessed (grossly at least 4/5 throughout; no focal weakness appreciated)       Communication   Communication Communication: No apparent difficulties    Cognition Arousal: Alert Behavior During Therapy: WFL for tasks assessed/performed   PT - Cognitive impairments: No apparent impairments                         Following commands: Intact       Cueing Cueing Techniques: Verbal cues, Gestural cues     General Comments      Exercises     Assessment/Plan    PT Assessment Patient needs continued PT services  PT Problem List Decreased activity tolerance;Decreased balance;Decreased mobility;Decreased knowledge of use of DME;Decreased safety awareness;Decreased knowledge of precautions;Cardiopulmonary status limiting activity       PT Treatment Interventions DME instruction;Gait training;Functional mobility training;Therapeutic exercise;Therapeutic activities;Patient/family education;Balance training    PT Goals (Current goals can be found in the Care Plan section)  Acute Rehab PT Goals Patient Stated Goal: to return home PT Goal Formulation: With patient Time For Goal Achievement: 05/23/24 Potential to Achieve Goals: Good    Frequency Min 2X/week     Co-evaluation               AM-PAC PT 6 Clicks Mobility  Outcome Measure Help needed turning from your back to your side while in a flat bed without using bedrails?: None Help needed moving from lying on your back to sitting on the side of a flat bed without using bedrails?: None Help needed moving to and from a bed to a chair (including a wheelchair)?: A Little Help needed standing up from a chair using your arms (e.g., wheelchair or bedside chair)?: A Little Help needed to walk in hospital room?: A Little Help needed climbing 3-5 steps with a railing? : A Little 6  Click Score: 20    End of Session   Activity Tolerance: Patient tolerated treatment well Patient left: in chair;with call bell/phone within reach;with chair alarm set Nurse Communication: Mobility status PT Visit Diagnosis: Muscle weakness (generalized) (M62.81);Difficulty in walking, not elsewhere classified (R26.2)    Time: 9047-8985 PT Time Calculation (min) (ACUTE ONLY): 22 min   Charges:   PT Evaluation $PT Eval Low Complexity: 1 Low   PT General Charges $$ ACUTE PT VISIT: 1 Visit         Gaege Sangalang H. Delores, PT, DPT, NCS 05/09/24, 11:36 AM [phone]

## 2024-05-09 NOTE — Plan of Care (Signed)

## 2024-05-09 NOTE — Care Management Important Message (Signed)
 Important Message  Patient Details  Name: [name] MRN: [id number] Date of Birth: November 02, 1950   Important Message Given:  Yes - Medicare IM spoke with patient over the phone reviewed IMM with her, she's Covid Positive.     [name] 05/09/2024, 4:36 PM

## 2024-05-10 DIAGNOSIS — N390 Urinary tract infection, site not specified: Secondary | ICD-10-CM | POA: Diagnosis not present

## 2024-05-10 DIAGNOSIS — R7881 Bacteremia: Secondary | ICD-10-CM | POA: Diagnosis not present

## 2024-05-10 DIAGNOSIS — N12 Tubulo-interstitial nephritis, not specified as acute or chronic: Secondary | ICD-10-CM | POA: Diagnosis not present

## 2024-05-10 DIAGNOSIS — B9620 Unspecified Escherichia coli [E. coli] as the cause of diseases classified elsewhere: Secondary | ICD-10-CM | POA: Diagnosis not present

## 2024-05-10 LAB — GLUCOSE, CAPILLARY
Glucose-Capillary: 107 mg/dL — ABNORMAL HIGH (ref 70–99)
Glucose-Capillary: 108 mg/dL — ABNORMAL HIGH (ref 70–99)
Glucose-Capillary: 118 mg/dL — ABNORMAL HIGH (ref 70–99)
Glucose-Capillary: 124 mg/dL — ABNORMAL HIGH (ref 70–99)

## 2024-05-10 MED ORDER — LACTULOSE 10 GM/15ML PO SOLN: 20.0000 g | Freq: Two times a day (BID) | ORAL | Status: DC | PRN

## 2024-05-10 MED ORDER — POLYETHYLENE GLYCOL 3350 17 G PO PACK
17.0000 g | PACK | Freq: Every day | ORAL | Status: DC
  Administered 2024-05-10 – 2024-05-12 (×3): 17 g via ORAL
  Filled 2024-05-10 (×5): qty 1

## 2024-05-10 MED ORDER — DICLOFENAC SODIUM 1 % EX GEL
2.0000 g | Freq: Four times a day (QID) | CUTANEOUS | Status: DC
  Administered 2024-05-10 – 2024-05-14 (×13): 2 g via TOPICAL
  Filled 2024-05-10: qty 100

## 2024-05-10 MED ORDER — DM-GUAIFENESIN ER 30-600 MG PO TB12: 1.0000 | ORAL_TABLET | Freq: Two times a day (BID) | ORAL | Status: DC | PRN

## 2024-05-10 MED ORDER — OXYCODONE HCL 5 MG PO TABS
5.0000 mg | ORAL_TABLET | ORAL | Status: DC | PRN
  Administered 2024-05-10 – 2024-05-12 (×8): 5 mg via ORAL
  Filled 2024-05-10 (×8): qty 1

## 2024-05-10 MED ORDER — OXYCODONE HCL 5 MG PO TABS: 2.5000 mg | ORAL_TABLET | ORAL | Status: DC | PRN

## 2024-05-10 NOTE — Progress Notes (Signed)
 Progress Note   Patient: [name] FMW:[id number] DOB: Oct 03, 1950 DOA: 05/06/2024     4 DOS: the patient was seen and examined on 05/10/2024   Brief hospital course: Ms. [name] is a 73 year old female with history of morbid obesity, neuropathy, hypothyroid, GERD, non-insulin -dependent diabetes mellitus type 2, hyperlipidemia, CAD, COPD, who presents ED from home for chief concerns of altered mentation.  Vitals in the ED showed t of 98.2, rr 21, hr 97, blood pressure 154/64, SpO2 100% on room air.  Serum sodium is 133, potassium 40.9, chloride 97, bicarb 20, BUN 15, serum creatinine 1.10, eGFR 54, nonfasting blood glucose 154, WBC 25.4, hemoglobin 12.8, platelets 378.  UA was positive for large leukocytes and positive for nitrates.  ED treatment: Cefepime , Flagyl , vancomycin , LR 1 L bolus.  Assessment and Plan: * Severe sepsis with acute organ dysfunction Resolved R sided pyelonephritis  E coli bacteremia  MSSA bacteremia  Patient with markedly elevated leukocytosis of 25.4, elevated heart rate and respiration rate, organ derangement is metabolic encephalopathy,source is likely urine. Sepsis physiology resolved.  Bcx w/ e coli, BCID with MSSA, Bcx curently not growing MSSA  UCX with ecoli  MSSA likely from wounds on extremities  TTE without evidence of valve vegetation  Continue cefazolin  Per ID if Blood cultures do not grow MSSA patient will can d/c with PO medicaitons  Continue to monitor white blood cell count and fever Maintain MAP greater than 65  COVID-19 virus infection Incidentally discovered. Symptomatic support: Albuterol  inhaler every 6 hours as needed for wheezing and shortness of breath  Morbid obesity (HCC) BMI 37.  This complicates overall care and prognosis.   Mild normocytic anemia     Latest Ref Rng & Units 05/09/2024    7:56 AM 05/08/2024    5:15 AM 05/07/2024    7:10 AM  CBC  WBC 4.0 - 10.5 K/uL 9.9  11.7  21.0   Hemoglobin 12.0 - 15.0  g/dL 88.3  88.1  88.3   Hematocrit 36.0 - 46.0 % 34.2  33.6  33.6   Platelets 150 - 400 K/uL 351  294  301    H/o Fe deficiency Check iron  levels in the AM   Mild hyponatremia     Latest Ref Rng & Units 05/09/2024    7:56 AM 05/08/2024    5:15 AM 05/07/2024    7:10 AM  BMP  Glucose 70 - 99 mg/dL 891  892  869   BUN 8 - 23 mg/dL 8  9  12    Creatinine 0.44 - 1.00 mg/dL 9.34  9.32  9.12   Sodium 135 - 145 mmol/L 138  134  134   Potassium 3.5 - 5.1 mmol/L 3.6  3.6  3.3   Chloride 98 - 111 mmol/L 101  104  99   CO2 22 - 32 mmol/L 21  21  21    Calcium  8.9 - 10.3 mg/dL 8.5  7.9  7.9    Resolved  Mild metabolic acidosis  Patient's bicarb 20 and AG 16 on admission. Lactic acid mildly elevated likely component of starvation ketosis. This improved with fluids. Continue to encourage PO intake. Will continue to monitor.    AKI Resolved.  Patient's scr 1.1 on admission. Now normalized.   GERD (gastroesophageal reflux disease) Hold for both today 20 mg every other day Prilosec equivalent every other day resumed on admission  CAD Hyperlipidemia associated with type 2 diabetes mellitus (HCC) Home simvastatin  40 mg nightly  Continue home imdur  and  ranolazine    Hypertension home Imdur   Hypothyroidism Home levothyroxine  100 mcg daily before breakfast  OSA (obstructive sleep apnea) CPAP nightly ordered  Type 2 diabetes mellitus with other specified complication (HCC) Well-controlled most recent A1c 6 Home oral antihyperglycemic agents will not be resumed on admission Insulin  SSI with at bedtime coverage ordered        Subjective: some pain.   Physical Exam: Vitals:   05/09/24 2006 05/10/24 0300 05/10/24 0804 05/10/24 1617  BP: 134/60 131/62 123/86 121/87  Pulse: 74 70 82 84  Resp: 18 18 17 18   Temp: 98.8 F (37.1 C) 98.6 F (37 C) (!) 97.5 F (36.4 C) 98.3 F (36.8 C)  TempSrc:   Oral Oral  SpO2: 98% 99% 97% 98%  Weight:      Height:         Constitutional: In no  distress.  Cardiovascular: Normal rate, regular rhythm. No lower extremity edema  Pulmonary: Non labored breathing on room air, no wheezing or rales.   Abdominal: Soft. Non distended and non tender Musculoskeletal: Normal range of motion.     Neurological: Alert and oriented to person, place, and time. Non focal  Skin: Skin is warm and dry.   Data Reviewed:     Latest Ref Rng & Units 05/09/2024    7:56 AM 05/08/2024    5:15 AM 05/07/2024    7:10 AM  CBC  WBC 4.0 - 10.5 K/uL 9.9  11.7  21.0   Hemoglobin 12.0 - 15.0 g/dL 88.3  88.1  88.3   Hematocrit 36.0 - 46.0 % 34.2  33.6  33.6   Platelets 150 - 400 K/uL 351  294  301       Latest Ref Rng & Units 05/09/2024    7:56 AM 05/08/2024    5:15 AM 05/07/2024    7:10 AM  BMP  Glucose 70 - 99 mg/dL 891  892  869   BUN 8 - 23 mg/dL 8  9  12    Creatinine 0.44 - 1.00 mg/dL 9.34  9.32  9.12   Sodium 135 - 145 mmol/L 138  134  134   Potassium 3.5 - 5.1 mmol/L 3.6  3.6  3.3   Chloride 98 - 111 mmol/L 101  104  99   CO2 22 - 32 mmol/L 21  21  21    Calcium  8.9 - 10.3 mg/dL 8.5  7.9  7.9        Latest Ref Rng & Units 05/09/2024    7:56 AM 05/08/2024    5:15 AM 05/07/2024    7:10 AM  CBC  WBC 4.0 - 10.5 K/uL 9.9  11.7  21.0   Hemoglobin 12.0 - 15.0 g/dL 88.3  88.1  88.3   Hematocrit 36.0 - 46.0 % 34.2  33.6  33.6   Platelets 150 - 400 K/uL 351  294  301     Family Communication: None at bedside   Disposition: Status is: Inpatient Remains inpatient appropriate because: IV antibiotics   Planned Discharge Destination: Home    Time spent: 35 minutes  Author: Alban Pepper, MD 05/10/2024 7:13 PM  For on call review [email or web address].

## 2024-05-10 NOTE — Plan of Care (Signed)
   Problem: Education: Goal: Ability to describe self-care measures that may prevent or decrease complications (Diabetes Survival Skills Education) will improve Outcome: Progressing Goal: Individualized Educational Video(s) Outcome: Progressing   Problem: Coping: Goal: Ability to adjust to condition or change in health will improve Outcome: Progressing

## 2024-05-10 NOTE — Plan of Care (Signed)
  Problem: Education: Goal: Ability to describe self-care measures that may prevent or decrease complications (Diabetes Survival Skills Education) will improve Outcome: Progressing Goal: Individualized Educational Video(s) Outcome: Progressing   Problem: Fluid Volume: Goal: Ability to maintain a balanced intake and output will improve Outcome: Progressing   Problem: Health Behavior/Discharge Planning: Goal: Ability to identify and utilize available resources and services will improve Outcome: Progressing Goal: Ability to manage health-related needs will improve Outcome: Progressing   Problem: Metabolic: Goal: Ability to maintain appropriate glucose levels will improve Outcome: Progressing   Problem: Nutritional: Goal: Maintenance of adequate nutrition will improve Outcome: Progressing Goal: Progress toward achieving an optimal weight will improve Outcome: Progressing   Problem: Skin Integrity: Goal: Risk for impaired skin integrity will decrease Outcome: Progressing   Problem: Tissue Perfusion: Goal: Adequacy of tissue perfusion will improve Outcome: Progressing   Problem: Fluid Volume: Goal: Hemodynamic stability will improve Outcome: Progressing   Problem: Clinical Measurements: Goal: Diagnostic test results will improve Outcome: Progressing Goal: Signs and symptoms of infection will decrease Outcome: Progressing   Problem: Respiratory: Goal: Ability to maintain adequate ventilation will improve Outcome: Progressing   Problem: Clinical Measurements: Goal: Ability to maintain clinical measurements within normal limits will improve Outcome: Progressing Goal: Will remain free from infection Outcome: Progressing Goal: Diagnostic test results will improve Outcome: Progressing Goal: Respiratory complications will improve Outcome: Progressing Goal: Cardiovascular complication will be avoided Outcome: Progressing   Problem: Activity: Goal: Risk for activity  intolerance will decrease Outcome: Progressing   Problem: Nutrition: Goal: Adequate nutrition will be maintained Outcome: Progressing   Problem: Coping: Goal: Level of anxiety will decrease Outcome: Progressing   Problem: Elimination: Goal: Will not experience complications related to bowel motility Outcome: Progressing Goal: Will not experience complications related to urinary retention Outcome: Progressing   Problem: Safety: Goal: Ability to remain free from injury will improve Outcome: Progressing   Problem: Skin Integrity: Goal: Risk for impaired skin integrity will decrease Outcome: Progressing   Problem: Pain Managment: Goal: General experience of comfort will improve and/or be controlled Outcome: Progressing

## 2024-05-10 NOTE — TOC Initial Note (Signed)
 Transition of Care [facility]) - Initial/Assessment Note    Patient Details  Name: [name] MRN: [id number] Date of Birth: 08/11/1951  [facility] CM/SW Contact:    Victory Jackquline RAMAN, RN Phone Number: 05/10/2024, 2:58 PM  Clinical Narrative:                 Chart reviewed. RNCM, spoke with the patient, I introduced myself, my role, and explained that discharge planning recommendations would be discussed. PT recommended Home Health PT. Patient decline HH/PT services, states that she has help at home between her husband and her twin son's. She has DME at home, walker and cane. Husband or son will pick her up at the time of discharge. RNCM will continue to follow for discharge planning/care coordination and update as applicable.    Expected Discharge Plan: Home/Self Care Barriers to Discharge: No Barriers Identified   Patient Goals and CMS Choice            Expected Discharge Plan and Services                                              Prior Living Arrangements/Services   Lives with:: Spouse, Adult Children (Has Twin Son's) Patient language and need for interpreter reviewed:: Yes Do you feel safe going back to the place where you live?: Yes      Need for Family Participation in Patient Care: Yes (Comment) Care giver support system in place?: Yes (comment) Current home services: DME Criminal Activity/Legal Involvement Pertinent to Current Situation/Hospitalization: No - Comment as needed  Activities of Daily Living      Permission Sought/Granted                  Emotional Assessment Appearance:: Appears stated age, Well-Groomed Attitude/Demeanor/Rapport: Self-Confident, Engaged, Gracious Affect (typically observed): Accepting, Calm, Quiet Orientation: : Oriented to Self, Oriented to Place, Oriented to  Time, Oriented to Situation Alcohol / Substance Use: Not Applicable Psych Involvement: No (comment)  Admission diagnosis:  Severe  sepsis with acute organ dysfunction (HCC) [A41.9, R65.20] Urinary tract infection without hematuria, site unspecified [N39.0] Altered mental status, unspecified altered mental status type [R41.82] Sepsis without acute organ dysfunction, due to unspecified organism [facility]) [A41.9] Patient Active Problem List   Diagnosis Date Noted   E coli bacteremia 05/08/2024   MSSA bacteremia 05/08/2024   Pyelonephritis 05/08/2024   Complicated UTI (urinary tract infection) 05/08/2024   Severe sepsis with acute organ dysfunction (HCC) 05/06/2024   Morbid obesity (HCC) 05/06/2024   COVID-19 virus infection 05/06/2024   Pressure injury of skin 05/06/2024   Coccygodynia 02/13/2024   Ventral hernia 11/24/2023   Elevated vitamin B12 level 11/17/2022   Iron  deficiency 11/17/2022   Postural dizziness with presyncope 02/13/2022   Skin lesion of breast 11/13/2021   AAA (abdominal aortic aneurysm) (HCC) 10/09/2020   Back pain 09/27/2020   Gross hematuria 09/14/2020   S/P CABG x 1 08/03/2020   Abnormal cardiovascular stress test    Aortic atherosclerosis (HCC) 04/15/2020   Asthma 04/15/2020   Pill dysphagia 04/05/2020   Cervical neck pain with evidence of disc disease 10/23/2019   Osteoarthritis 10/23/2019   Anemia 10/23/2019   Hepatic steatosis 05/03/2019   Thrombocytosis 04/24/2019   Migraine 04/19/2018   Severe obesity (BMI 35.0-39.9) with comorbidity (HCC) 08/17/2017   Palpitations 01/08/2017   Neck swelling 07/10/2016  Neutrophilia 04/30/2015   Advanced care planning/counseling discussion 01/22/2015   Health maintenance examination 01/22/2015   Medicare annual wellness visit, subsequent 12/15/2013   Hearing loss 02/25/2013   Tachycardia 09/13/2012   Osteoarthritis of left knee 12/11/2011   Candidal intertrigo 05/10/2011   Hypertension    Bipolar disorder (HCC)    Fibromyalgia    Hyperlipidemia associated with type 2 diabetes mellitus (HCC)    GERD (gastroesophageal reflux disease)     Hypothyroidism 04/05/2011   OSA (obstructive sleep apnea) 03/16/2011   CAD (coronary artery disease) 03/16/2011   Type 2 diabetes mellitus with other specified complication (HCC) 03/15/2011   PCP:  Rilla Baller, MD Pharmacy:   [phone] [address], [place] - [address] [place] [address] Phone: [phone] Fax: [phone]  [facility] [place], [place] - [address] [facility] AT Portal to Registered [facility] [address] [place] [address] Phone: [phone] Fax: [phone]  [facility] - [place], [place] - [address] [place] [address] Phone: [phone] Fax: [phone]     Social Drivers of Health (SDOH) Social History: SDOH Screenings   Food Insecurity: No Food Insecurity (05/07/2024)  Housing: Low Risk  (05/07/2024)  Transportation Needs: No Transportation Needs (05/07/2024)  Utilities: Not At Risk (05/07/2024)  Alcohol Screen: Low Risk  (02/08/2024)  Depression (PHQ2-9): Low Risk  (02/12/2024)  Financial Resource Strain: Low Risk  (02/08/2024)  Physical Activity: Insufficiently Active (02/08/2024)  Social Connections: Moderately Isolated (05/07/2024)  Stress: No Stress Concern Present (02/08/2024)  Tobacco Use: Medium Risk (05/06/2024)  Health Literacy: Adequate Health Literacy (02/12/2024)   SDOH Interventions:     Readmission Risk Interventions     No data to display

## 2024-05-10 NOTE — Plan of Care (Signed)

## 2024-05-11 ENCOUNTER — Inpatient Hospital Stay

## 2024-05-11 DIAGNOSIS — N12 Tubulo-interstitial nephritis, not specified as acute or chronic: Secondary | ICD-10-CM | POA: Diagnosis not present

## 2024-05-11 DIAGNOSIS — B9620 Unspecified Escherichia coli [E. coli] as the cause of diseases classified elsewhere: Secondary | ICD-10-CM | POA: Diagnosis not present

## 2024-05-11 DIAGNOSIS — N390 Urinary tract infection, site not specified: Secondary | ICD-10-CM | POA: Diagnosis not present

## 2024-05-11 DIAGNOSIS — R7881 Bacteremia: Secondary | ICD-10-CM | POA: Diagnosis not present

## 2024-05-11 LAB — CBC WITH DIFFERENTIAL/PLATELET
Abs Immature Granulocytes: 0.27 K/uL — ABNORMAL HIGH (ref 0.00–0.07)
Basophils Absolute: 0.1 K/uL (ref 0.0–0.1)
Basophils Relative: 0 %
Eosinophils Absolute: 0 K/uL (ref 0.0–0.5)
Eosinophils Relative: 0 %
HCT: 30.2 % — ABNORMAL LOW (ref 36.0–46.0)
Hemoglobin: 10.4 g/dL — ABNORMAL LOW (ref 12.0–15.0)
Immature Granulocytes: 2 %
Lymphocytes Relative: 12 %
Lymphs Abs: 1.8 K/uL (ref 0.7–4.0)
MCH: 32 pg (ref 26.0–34.0)
MCHC: 34.4 g/dL (ref 30.0–36.0)
MCV: 92.9 fL (ref 80.0–100.0)
Monocytes Absolute: 1.9 K/uL — ABNORMAL HIGH (ref 0.1–1.0)
Monocytes Relative: 13 %
Neutro Abs: 11.4 K/uL — ABNORMAL HIGH (ref 1.7–7.7)
Neutrophils Relative %: 73 %
Platelets: 429 K/uL — ABNORMAL HIGH (ref 150–400)
RBC: 3.25 MIL/uL — ABNORMAL LOW (ref 3.87–5.11)
RDW: 13.2 % (ref 11.5–15.5)
WBC: 15.5 K/uL — ABNORMAL HIGH (ref 4.0–10.5)
nRBC: 0 % (ref 0.0–0.2)

## 2024-05-11 LAB — IRON AND TIBC
Iron: 18 ug/dL — ABNORMAL LOW (ref 28–170)
Saturation Ratios: 8 % — ABNORMAL LOW (ref 10.4–31.8)
TIBC: 231 ug/dL — ABNORMAL LOW (ref 250–450)
UIBC: 213 ug/dL

## 2024-05-11 LAB — BASIC METABOLIC PANEL WITH GFR
Anion gap: 9 (ref 5–15)
BUN: 7 mg/dL — ABNORMAL LOW (ref 8–23)
CO2: 24 mmol/L (ref 22–32)
Calcium: 8.2 mg/dL — ABNORMAL LOW (ref 8.9–10.3)
Chloride: 103 mmol/L (ref 98–111)
Creatinine, Ser: 0.69 mg/dL (ref 0.44–1.00)
GFR, Estimated: >60 mL/min (ref >60)
Glucose, Bld: 132 mg/dL — ABNORMAL HIGH (ref 70–99)
Potassium: 3.5 mmol/L (ref 3.5–5.1)
Sodium: 136 mmol/L (ref 135–145)

## 2024-05-11 LAB — FERRITIN: Ferritin: 185 ng/mL (ref 11–307)

## 2024-05-11 LAB — GLUCOSE, CAPILLARY
Glucose-Capillary: 100 mg/dL — ABNORMAL HIGH (ref 70–99)
Glucose-Capillary: 171 mg/dL — ABNORMAL HIGH (ref 70–99)
Glucose-Capillary: 92 mg/dL (ref 70–99)
Glucose-Capillary: 123 mg/dL — ABNORMAL HIGH (ref 70–99)

## 2024-05-11 LAB — BETA-HYDROXYBUTYRIC ACID: Beta-Hydroxybutyric Acid: 0.59 mmol/L — ABNORMAL HIGH (ref 0.05–0.27)

## 2024-05-11 MED ORDER — GADOBUTROL 1 MMOL/ML IV SOLN
9.0000 mL | Freq: Once | INTRAVENOUS | Status: AC | PRN
Start: 2024-05-11 — End: 2024-05-11
  Administered 2024-05-11: 9 mL via INTRAVENOUS

## 2024-05-11 MED ORDER — FERROUS SULFATE 325 (65 FE) MG PO TABS
325.0000 mg | ORAL_TABLET | Freq: Every day | ORAL | Status: DC
  Administered 2024-05-12 – 2024-05-14 (×3): 325 mg via ORAL
  Filled 2024-05-11 (×3): qty 1

## 2024-05-11 MED ORDER — LACTULOSE 10 GM/15ML PO SOLN
20.0000 g | Freq: Two times a day (BID) | ORAL | Status: DC
  Administered 2024-05-11 – 2024-05-13 (×3): 20 g via ORAL
  Filled 2024-05-11 (×6): qty 30

## 2024-05-11 NOTE — Plan of Care (Signed)
   Problem: Education: Goal: Ability to describe self-care measures that may prevent or decrease complications (Diabetes Survival Skills Education) will improve Outcome: Progressing   Problem: Fluid Volume: Goal: Ability to maintain a balanced intake and output will improve Outcome: Progressing   Problem: Nutritional: Goal: Maintenance of adequate nutrition will improve Outcome: Progressing

## 2024-05-11 NOTE — Progress Notes (Signed)
 Progress Note   Patient: [name] FMW:[id number] DOB: November 02, 1950 DOA: 05/06/2024     5 DOS: the patient was seen and examined on 05/11/2024   Brief hospital course: Ms. [name] is a 73 year old female with history of morbid obesity, neuropathy, hypothyroid, GERD, non-insulin -dependent diabetes mellitus type 2, hyperlipidemia, CAD, COPD, who presents ED from home for chief concerns of altered mentation.  Vitals in the ED showed t of 98.2, rr 21, hr 97, blood pressure 154/64, SpO2 100% on room air.  Serum sodium is 133, potassium 40.9, chloride 97, bicarb 20, BUN 15, serum creatinine 1.10, eGFR 54, nonfasting blood glucose 154, WBC 25.4, hemoglobin 12.8, platelets 378.  UA was positive for large leukocytes and positive for nitrates.  ED treatment: Cefepime , Flagyl , vancomycin , LR 1 L bolus.  Assessment and Plan: * Severe sepsis with acute organ dysfunction Resolved R sided pyelonephritis  E coli bacteremia  MSSA bacteremia  Patient with markedly elevated leukocytosis of 25.4, elevated heart rate and respiration rate, organ derangement is metabolic encephalopathy,source is likely urine. Sepsis physiology resolved.  Bcx w/ e coli, BCID with MSSA, Bcx curently not growing MSSA  UCX with ecoli  MSSA likely from wounds on extremities  TTE without evidence of valve vegetation  Given worsening leukocytosis this a.m., no fever however Will get MRI of C-spine and L-spine given patient is neck and back pain to rule out osteo of vertebrae Will obtain CT of abdomen pelvis to rule out renal abscess Continue cefazolin  Continue to monitor white blood cell count and fever Maintain MAP greater than 65  COVID-19 virus infection Incidentally discovered. Symptomatic support: Albuterol  inhaler every 6 hours as needed for wheezing and shortness of breath  Morbid obesity (HCC) BMI 37.  This complicates overall care and prognosis.   Mild normocytic anemia     Latest Ref Rng & Units  05/11/2024    5:44 AM 05/09/2024    7:56 AM 05/08/2024    5:15 AM  CBC  WBC 4.0 - 10.5 K/uL 15.5  9.9  11.7   Hemoglobin 12.0 - 15.0 g/dL 89.5  88.3  88.1   Hematocrit 36.0 - 46.0 % 30.2  34.2  33.6   Platelets 150 - 400 K/uL 429  351  294   Iron /TIBC/Ferritin/ %Sat    Component Value Date/Time   IRON  18 (L) 05/11/2024 0544   TIBC 231 (L) 05/11/2024 0544   FERRITIN 185 05/11/2024 0544   IRONPCTSAT 8 (L) 05/11/2024 0544   Iron  deficiency noted. Will start PO iron   Needs to see PCP for further evaluation of this   Mild hyponatremia     Latest Ref Rng & Units 05/11/2024    5:44 AM 05/09/2024    7:56 AM 05/08/2024    5:15 AM  BMP  Glucose 70 - 99 mg/dL 867  891  892   BUN 8 - 23 mg/dL 7  8  9    Creatinine 0.44 - 1.00 mg/dL 9.30  9.34  9.32   Sodium 135 - 145 mmol/L 136  138  134   Potassium 3.5 - 5.1 mmol/L 3.5  3.6  3.6   Chloride 98 - 111 mmol/L 103  101  104   CO2 22 - 32 mmol/L 24  21  21    Calcium  8.9 - 10.3 mg/dL 8.2  8.5  7.9    Resolved  Mild metabolic acidosis resolved   AKI Resolved.    GERD (gastroesophageal reflux disease) Patient on famotidine  and Protonix   CAD Hyperlipidemia associated  with type 2 diabetes mellitus (HCC) Home simvastatin  40 mg nightly  Continue home imdur  and ranolazine    Hypertension Well-controlled home Imdur   Hypothyroidism Home levothyroxine  100 mcg daily before breakfast  OSA (obstructive sleep apnea) CPAP nightly ordered  Type 2 diabetes mellitus with other specified complication (HCC) Well-controlled most recent A1c 6 Home oral antihyperglycemic agents will not be resumed on admission Insulin  SSI with at bedtime coverage ordered  CBG (last 3)  Recent Labs    05/11/24 1017 05/11/24 1149 05/11/24 1657  GLUCAP 100* 171* 92         Subjective: some pain in neck and back, this was worsened with movement to get to bedside commode or to chair.   Physical Exam: Vitals:   05/10/24 2005 05/11/24 0500 05/11/24 0834  05/11/24 1643  BP: 124/63 121/60 (!) 140/69 128/63  Pulse: 87 88 81 80  Resp: 18 16 17 17   Temp: 98.2 F (36.8 C) 98.1 F (36.7 C) 98.7 F (37.1 C) 98.7 F (37.1 C)  TempSrc:      SpO2: 95% 98% 96% 98%  Weight:      Height:         Constitutional: In no distress.  Cardiovascular: Normal rate, regular rhythm. No lower extremity edema  Pulmonary: Non labored breathing on room air, no wheezing or rales.   Abdominal: Soft. Non distended and non tender    Neurological: Alert and oriented to person, place, and time. Non focal  Skin: Skin is warm and dry.    Data Reviewed:     Latest Ref Rng & Units 05/11/2024    5:44 AM 05/09/2024    7:56 AM 05/08/2024    5:15 AM  CBC  WBC 4.0 - 10.5 K/uL 15.5  9.9  11.7   Hemoglobin 12.0 - 15.0 g/dL 89.5  88.3  88.1   Hematocrit 36.0 - 46.0 % 30.2  34.2  33.6   Platelets 150 - 400 K/uL 429  351  294       Latest Ref Rng & Units 05/11/2024    5:44 AM 05/09/2024    7:56 AM 05/08/2024    5:15 AM  BMP  Glucose 70 - 99 mg/dL 867  891  892   BUN 8 - 23 mg/dL 7  8  9    Creatinine 0.44 - 1.00 mg/dL 9.30  9.34  9.32   Sodium 135 - 145 mmol/L 136  138  134   Potassium 3.5 - 5.1 mmol/L 3.5  3.6  3.6   Chloride 98 - 111 mmol/L 103  101  104   CO2 22 - 32 mmol/L 24  21  21    Calcium  8.9 - 10.3 mg/dL 8.2  8.5  7.9       Family Communication: None at bedside   Disposition: Status is: Inpatient Remains inpatient appropriate because: IV antibiotics   Planned Discharge Destination: Home    Time spent: 35 minutes  Author: Alban Pepper, MD 05/11/2024 6:31 PM  For on call review [email or web address].

## 2024-05-12 ENCOUNTER — Inpatient Hospital Stay

## 2024-05-12 DIAGNOSIS — N12 Tubulo-interstitial nephritis, not specified as acute or chronic: Secondary | ICD-10-CM | POA: Diagnosis not present

## 2024-05-12 DIAGNOSIS — B9620 Unspecified Escherichia coli [E. coli] as the cause of diseases classified elsewhere: Secondary | ICD-10-CM | POA: Diagnosis not present

## 2024-05-12 DIAGNOSIS — N390 Urinary tract infection, site not specified: Secondary | ICD-10-CM | POA: Diagnosis not present

## 2024-05-12 DIAGNOSIS — R7881 Bacteremia: Secondary | ICD-10-CM | POA: Diagnosis not present

## 2024-05-12 LAB — CBC WITH DIFFERENTIAL/PLATELET
Abs Immature Granulocytes: 0.32 K/uL — ABNORMAL HIGH (ref 0.00–0.07)
Basophils Absolute: 0.1 K/uL (ref 0.0–0.1)
Basophils Relative: 0 %
Eosinophils Absolute: 0 K/uL (ref 0.0–0.5)
Eosinophils Relative: 0 %
HCT: 31.2 % — ABNORMAL LOW (ref 36.0–46.0)
Hemoglobin: 10.6 g/dL — ABNORMAL LOW (ref 12.0–15.0)
Immature Granulocytes: 2 %
Lymphocytes Relative: 13 %
Lymphs Abs: 2.2 K/uL (ref 0.7–4.0)
MCH: 31.9 pg (ref 26.0–34.0)
MCHC: 34 g/dL (ref 30.0–36.0)
MCV: 94 fL (ref 80.0–100.0)
Monocytes Absolute: 1.8 K/uL — ABNORMAL HIGH (ref 0.1–1.0)
Monocytes Relative: 11 %
Neutro Abs: 12.2 K/uL — ABNORMAL HIGH (ref 1.7–7.7)
Neutrophils Relative %: 74 %
Platelets: 463 K/uL — ABNORMAL HIGH (ref 150–400)
RBC: 3.32 MIL/uL — ABNORMAL LOW (ref 3.87–5.11)
RDW: 13.2 % (ref 11.5–15.5)
WBC: 16.7 K/uL — ABNORMAL HIGH (ref 4.0–10.5)
nRBC: 0 % (ref 0.0–0.2)

## 2024-05-12 LAB — BASIC METABOLIC PANEL WITH GFR
Anion gap: 11 (ref 5–15)
BUN: 9 mg/dL (ref 8–23)
CO2: 24 mmol/L (ref 22–32)
Calcium: 8.3 mg/dL — ABNORMAL LOW (ref 8.9–10.3)
Chloride: 100 mmol/L (ref 98–111)
Creatinine, Ser: 0.62 mg/dL (ref 0.44–1.00)
GFR, Estimated: >60 mL/min (ref >60)
Glucose, Bld: 109 mg/dL — ABNORMAL HIGH (ref 70–99)
Potassium: 3.4 mmol/L — ABNORMAL LOW (ref 3.5–5.1)
Sodium: 135 mmol/L (ref 135–145)

## 2024-05-12 LAB — AEROBIC CULTURE W GRAM STAIN (SUPERFICIAL SPECIMEN): Gram Stain: NONE SEEN

## 2024-05-12 LAB — GLUCOSE, CAPILLARY
Glucose-Capillary: 124 mg/dL — ABNORMAL HIGH (ref 70–99)
Glucose-Capillary: 123 mg/dL — ABNORMAL HIGH (ref 70–99)
Glucose-Capillary: 111 mg/dL — ABNORMAL HIGH (ref 70–99)
Glucose-Capillary: 114 mg/dL — ABNORMAL HIGH (ref 70–99)

## 2024-05-12 MED ORDER — POTASSIUM CHLORIDE CRYS ER 20 MEQ PO TBCR
40.0000 meq | EXTENDED_RELEASE_TABLET | Freq: Once | ORAL | Status: AC
  Administered 2024-05-12: 40 meq via ORAL
  Filled 2024-05-12: qty 2

## 2024-05-12 MED ORDER — IOHEXOL 9 MG/ML PO SOLN
500.0000 mL | ORAL | Status: AC
Start: 2024-05-12 — End: 2024-05-12
  Administered 2024-05-12 (×2): 500 mL via ORAL

## 2024-05-12 MED ORDER — METHOCARBAMOL 500 MG PO TABS
750.0000 mg | ORAL_TABLET | Freq: Three times a day (TID) | ORAL | Status: DC | PRN
  Administered 2024-05-12 – 2024-05-14 (×3): 750 mg via ORAL
  Filled 2024-05-12 (×3): qty 2

## 2024-05-12 MED ORDER — OXYCODONE HCL 5 MG PO TABS
10.0000 mg | ORAL_TABLET | ORAL | Status: DC | PRN
  Administered 2024-05-12: 10 mg via ORAL
  Filled 2024-05-12: qty 2

## 2024-05-12 MED ORDER — OXYCODONE HCL 5 MG PO TABS
5.0000 mg | ORAL_TABLET | ORAL | Status: DC | PRN
  Administered 2024-05-12 – 2024-05-14 (×3): 5 mg via ORAL
  Filled 2024-05-12 (×3): qty 1

## 2024-05-12 MED ORDER — IOHEXOL 300 MG/ML  SOLN
100.0000 mL | Freq: Once | INTRAMUSCULAR | Status: AC | PRN
  Administered 2024-05-12: 100 mL via INTRAVENOUS

## 2024-05-12 NOTE — Progress Notes (Signed)
 Progress Note   Patient: [name] FMW:[id number] DOB: 1951-03-08 DOA: 05/06/2024     6 DOS: the patient was seen and examined on 05/12/2024   Brief hospital course: Ms. Shandelle Borrelli is a 73 year old female with history of morbid obesity, neuropathy, hypothyroid, GERD, non-insulin -dependent diabetes mellitus type 2, hyperlipidemia, CAD, COPD, who presents ED from home for chief concerns of altered mentation.  Vitals in the ED showed t of 98.2, rr 21, hr 97, blood pressure 154/64, SpO2 100% on room air.  Serum sodium is 133, potassium 40.9, chloride 97, bicarb 20, BUN 15, serum creatinine 1.10, eGFR 54, nonfasting blood glucose 154, WBC 25.4, hemoglobin 12.8, platelets 378.  UA was positive for large leukocytes and positive for nitrates.  ED treatment: Cefepime , Flagyl , vancomycin , LR 1 L bolus.  Assessment and Plan: * Severe sepsis with acute organ dysfunction Resolved R sided pyelonephritis  E coli bacteremia  MSSA bacteremia  Patient with markedly elevated leukocytosis of 25.4, elevated heart rate and respiration rate, organ derangement is metabolic encephalopathy,source is likely urine. Sepsis physiology resolved.  Bcx w/ e coli, staph epidermidis  BCID with MSSA, Bcx curently not growing MSSA UCX with ecoli  MSSA likely from wounds on extremities  TTE without evidence of valve vegetation Had pain in neck and back, MRI of c and L spine negative for osteo Worseneing leukocytosis  Will check ct a/p to see if renal abscess, MRI c and l spine no osteo     COVID-19 virus infection Incidentally discovered. Symptomatic support: Albuterol  inhaler every 6 hours as needed for wheezing and shortness of breath  Morbid obesity (HCC) BMI 37.  This complicates overall care and prognosis.   Mild normocytic anemia     Latest Ref Rng & Units 05/12/2024    5:24 AM 05/11/2024    5:44 AM 05/09/2024    7:56 AM  CBC  WBC 4.0 - 10.5 K/uL 16.7  15.5  9.9   Hemoglobin 12.0 - 15.0 g/dL  89.3  89.5  88.3   Hematocrit 36.0 - 46.0 % 31.2  30.2  34.2   Platelets 150 - 400 K/uL 463  429  351   Iron /TIBC/Ferritin/ %Sat    Component Value Date/Time   IRON  18 (L) 05/11/2024 0544   TIBC 231 (L) 05/11/2024 0544   FERRITIN 185 05/11/2024 0544   IRONPCTSAT 8 (L) 05/11/2024 0544   Iron  deficiency noted. Continue PO iron   Needs to see PCP for further evaluation of this   Mild hyponatremia     Latest Ref Rng & Units 05/12/2024    5:24 AM 05/11/2024    5:44 AM 05/09/2024    7:56 AM  BMP  Glucose 70 - 99 mg/dL 890  867  891   BUN 8 - 23 mg/dL 9  7  8    Creatinine 0.44 - 1.00 mg/dL 9.37  9.30  9.34   Sodium 135 - 145 mmol/L 135  136  138   Potassium 3.5 - 5.1 mmol/L 3.4  3.5  3.6   Chloride 98 - 111 mmol/L 100  103  101   CO2 22 - 32 mmol/L 24  24  21    Calcium  8.9 - 10.3 mg/dL 8.3  8.2  8.5    Resolved  Mild metabolic acidosis resolved   AKI Resolved.    GERD (gastroesophageal reflux disease) Patient on famotidine  and Protonix   CAD Hyperlipidemia associated with type 2 diabetes mellitus (HCC) Home simvastatin  40 mg nightly  Continue home imdur  and ranolazine   Hypertension Well-controlled home Imdur   Hypothyroidism Home levothyroxine  100 mcg daily before breakfast  OSA (obstructive sleep apnea) CPAP nightly ordered  Type 2 diabetes mellitus with other specified complication (HCC) Well-controlled most recent A1c 6 Home oral antihyperglycemic agents will not be resumed on admission Insulin  SSI with at bedtime coverage ordered  CBG (last 3)  Recent Labs    05/12/24 0912 05/12/24 1140 05/12/24 1653  GLUCAP 124* 123* 111*         Subjective: No issues overnight   Physical Exam: Vitals:   05/11/24 2049 05/12/24 0449 05/12/24 0917 05/12/24 1640  BP: (!) 158/77 132/65 (!) 149/70 (!) 149/69  Pulse: 90 79 77 79  Resp: 16 18 20    Temp: 98.3 F (36.8 C) 98.1 F (36.7 C) 98.3 F (36.8 C) 98.3 F (36.8 C)  TempSrc: Oral  Oral   SpO2: 97% 94% 99%  97%  Weight:      Height:           Constitutional: In no distress.  Cardiovascular: Normal rate, regular rhythm. Trace lower extremity edema  Pulmonary: Non labored breathing on room air, no wheezing or rales.   Abdominal: Soft. Non distended and non tender Musculoskeletal: Normal range of motion.     Neurological: Alert and oriented to person, place, and time. Non focal  Skin: Skin is warm and dry.     Data Reviewed:     Latest Ref Rng & Units 05/12/2024    5:24 AM 05/11/2024    5:44 AM 05/09/2024    7:56 AM  CBC  WBC 4.0 - 10.5 K/uL 16.7  15.5  9.9   Hemoglobin 12.0 - 15.0 g/dL 89.3  89.5  88.3   Hematocrit 36.0 - 46.0 % 31.2  30.2  34.2   Platelets 150 - 400 K/uL 463  429  351       Latest Ref Rng & Units 05/12/2024    5:24 AM 05/11/2024    5:44 AM 05/09/2024    7:56 AM  BMP  Glucose 70 - 99 mg/dL 890  867  891   BUN 8 - 23 mg/dL 9  7  8    Creatinine 0.44 - 1.00 mg/dL 9.37  9.30  9.34   Sodium 135 - 145 mmol/L 135  136  138   Potassium 3.5 - 5.1 mmol/L 3.4  3.5  3.6   Chloride 98 - 111 mmol/L 100  103  101   CO2 22 - 32 mmol/L 24  24  21    Calcium  8.9 - 10.3 mg/dL 8.3  8.2  8.5       Family Communication: None at bedside , discussed withdaughter via phone   Disposition: Status is: Inpatient Remains inpatient appropriate because: IV antibiotics   Planned Discharge Destination: Home    Time spent: 35 minutes  Author: Alban Pepper, MD 05/12/2024 7:57 PM  For on call review [email or web address].

## 2024-05-12 NOTE — Plan of Care (Addendum)
  Problem: Coping: Goal: Ability to adjust to condition or change in health will improve Outcome: Progressing   Problem: Nutritional: Goal: Maintenance of adequate nutrition will improve Outcome: Progressing   Problem: Skin Integrity: Goal: Risk for impaired skin integrity will decrease Outcome: Progressing   Problem: Pain Managment: Goal: General experience of comfort will improve and/or be controlled Outcome: Progressing

## 2024-05-12 NOTE — Consult Note (Signed)
 PHARMACY CONSULT NOTE - ELECTROLYTES  Pharmacy Consult for Electrolyte Monitoring and Replacement   Recent Labs: Height: 5' 0.5 (153.7 cm) Weight: 88 kg (194 lb) IBW/kg (Calculated) : 46.65 Estimated Creatinine Clearance: 64.4 mL/min (by C-G formula based on SCr of 0.62 mg/dL). Potassium (mmol/L)  Date Value  05/12/2024 3.4 (L)  08/30/2012 3.9   Magnesium  (mg/dL)  Date Value  87/84/7978 1.8   Calcium  (mg/dL)  Date Value  90/77/7974 8.3 (L)   Calcium , Total (mg/dL)  Date Value  98/89/7985 8.4 (L)   Albumin  (g/dL)  Date Value  90/83/7974 3.5   Sodium (mmol/L)  Date Value  05/12/2024 135  08/26/2020 137  08/30/2012 134 (L)   Corrected Ca: 9.2 mg/dL  Assessment  [name] is a 73 y.o. female presenting with sepsis with acute organ failure. PMH significant for morbid obesity, neuropathy, hypothyroid, GERD, non-insulin -dependent diabetes mellitus type 2, hyperlipidemia, CAD, COPD . Pharmacy has been consulted to monitor and replace electrolytes.  Diet: heart healthy/carb modified diet  Goal of Therapy: Electrolytes WNL  Plan:  K 3.4, Kcl po 40meq x 1 dose ordered. No additional replacement needed at this time.  Check BMP, Mg, Phos with AM labs  Thank you for allowing pharmacy to be a part of this patient's care.  Dollye Glasser Rodriguez-Guzman PharmD, BCPS 05/12/2024 8:15 PM

## 2024-05-12 NOTE — Progress Notes (Signed)
 Date of Admission:  05/06/2024      ID: [name] is a 73 y.o. female  Principal Problem:   Severe sepsis with acute organ dysfunction (HCC) Active Problems:   Type 2 diabetes mellitus with other specified complication (HCC)   OSA (obstructive sleep apnea)   CAD (coronary artery disease)   Hypothyroidism   Hypertension   Bipolar disorder (HCC)   Hyperlipidemia associated with type 2 diabetes mellitus (HCC)   GERD (gastroesophageal reflux disease)   Hepatic steatosis   S/P CABG x 1   Morbid obesity (HCC)   COVID-19 virus infection   Pressure injury of skin   E coli bacteremia   MSSA bacteremia   Pyelonephritis   Complicated UTI (urinary tract infection)  MRI cervical spine and lumbar spine No infection  Subjective: C/p pain neck, back all over, left hip, rt knee  Medications:   aspirin  EC  81 mg Oral Daily   budesonide -glycopyrrolate -formoterol   2 puff Inhalation BID   diclofenac  Sodium  2 g Topical QID   famotidine   20 mg Oral QODAY   ferrous sulfate   325 mg Oral Q breakfast   gabapentin   300 mg Oral BID   heparin   5,000 Units Subcutaneous Q8H   insulin  aspart  0-20 Units Subcutaneous TID WC   insulin  aspart  0-5 Units Subcutaneous QHS   isosorbide  mononitrate  30 mg Oral BID   lactulose   20 g Oral BID   levothyroxine   100 mcg Oral QAC breakfast   metoprolol  succinate  12.5 mg Oral Daily   pantoprazole   40 mg Oral QODAY   polyethylene glycol  17 g Oral Daily   ranolazine   500 mg Oral BID   simvastatin   40 mg Oral QHS   ziprasidone   40 mg Oral BID WC    Objective: Vital signs in last 24 hours: Patient Vitals for the past 24 hrs:  BP Temp Temp src Pulse Resp SpO2  05/12/24 0917 (!) 149/70 98.3 F (36.8 C) Oral 77 20 99 %  05/12/24 0449 132/65 98.1 F (36.7 C) -- 79 18 94 %  05/11/24 2049 (!) 158/77 98.3 F (36.8 C) Oral 90 16 97 %  05/11/24 1643 128/63 98.7 F (37.1 C) -- 80 17 98 %      PHYSICAL EXAM:  General: Alert, cooperative, some  distress, appears stated age.   Back: No CVA tenderness. Lungs: Clear to auscultation bilaterally. No Wheezing or Rhonchi. No rales. Heart: Regular rate and rhythm, no murmur, rub or gallop. Abdomen: Soft, hernia  extremities: Left forearm multiple healing wounds Skin: As above lymph: Cervical, supraclavicular normal. Neurologic: Grossly non-focal  Lab Results    Latest Ref Rng & Units 05/12/2024    5:24 AM 05/11/2024    5:44 AM 05/09/2024    7:56 AM  CBC  WBC 4.0 - 10.5 K/uL 16.7  15.5  9.9   Hemoglobin 12.0 - 15.0 g/dL 89.3  89.5  88.3   Hematocrit 36.0 - 46.0 % 31.2  30.2  34.2   Platelets 150 - 400 K/uL 463  429  351        Latest Ref Rng & Units 05/12/2024    5:24 AM 05/11/2024    5:44 AM 05/09/2024    7:56 AM  CMP  Glucose 70 - 99 mg/dL 890  867  891   BUN 8 - 23 mg/dL 9  7  8    Creatinine 0.44 - 1.00 mg/dL 9.37  9.30  9.34   Sodium 135 -  145 mmol/L 135  136  138   Potassium 3.5 - 5.1 mmol/L 3.4  3.5  3.6   Chloride 98 - 111 mmol/L 100  103  101   CO2 22 - 32 mmol/L 24  24  21    Calcium  8.9 - 10.3 mg/dL 8.3  8.2  8.5       Microbiology: 05/06/2024 blood culture both sets as E. coli And once that has Staph aureus on BCID but not in culture yet  Studies/Results: MR Lumbar Spine W Wo Contrast Result Date: 05/11/2024 EXAM: MRI LUMBAR SPINE 05/11/2024 09:53:08 PM TECHNIQUE: Multiplanar multisequence MRI of the lumbar spine was performed with and without the administration of 9 mL intravenous gadobutrol  (GADAVIST ) 1 mmol/mL. COMPARISON: None available. CLINICAL HISTORY: Back pain, MSSA in BCID, r/o osteomyelitis. FINDINGS: BONES AND ALIGNMENT: Grade 1 anterolisthesis at L3-4. SPINAL CORD: The conus terminates normally. SOFT TISSUES: No paraspinal mass. L1-L2: No significant disc herniation. No spinal canal stenosis or neural foraminal narrowing. L2-L3: Small disc bulge with mild facet hypertrophy and mild spinal canal stenosis. L3-L4: Moderate facet hypertrophy. No central  spinal canal or neural foraminal stenosis. L4-L5: Grade 1 anterolisthesis with mild facet hypertrophy and left foraminal annular fissure. No central spinal canal or neural foraminal stenosis. L5-S1: Small disc bulge. No central spinal canal or neural foraminal stenosis. IMPRESSION: 1. Mild spinal canal stenosis at L2-3. 2. Grade 1 anterolisthesis at L3-4 and L4-5. 3. No evidence of osteomyelitis. Electronically signed by: Franky Stanford MD 05/11/2024 10:45 PM EDT RP Workstation: [id number]   MR CERVICAL SPINE W WO CONTRAST Result Date: 05/11/2024 EXAM: MRI CERVICAL SPINE WITH AND WITHOUT CONTRAST 05/11/2024 09:53:08 PM TECHNIQUE: Multiplanar multisequence MRI of the cervical spine was performed with and without intravenous contrast. 9 mL of gadobutrol  (GADAVIST ) 1 mmol/mL was administered. COMPARISON: None available. CLINICAL HISTORY: Neck pain, has MSSA on BCID looking for osteomyelitis of cervical spine. FINDINGS: BONES AND ALIGNMENT: Grade 1 anterolisthesis at C3-C4. C2-C3 right facet hypertrophy. No evidence of acute fracture or osteomyelitis. SPINAL CORD: Normal spinal cord size. No abnormal spinal cord signal. SOFT TISSUES: No paraspinal mass. C2-C3: Right facet hypertrophy. C3-C4: Small disc bulge with mild spinal canal stenosis. C4-C5: No significant disc herniation. No spinal canal stenosis or neural foraminal narrowing. C5-C6: Small disc bulge with uncovertebral spurring, mild spinal canal stenosis and mild bilateral foraminal stenosis. C6-C7: Small disc bulge with uncovertebral hypertrophy, mild spinal canal stenosis. C7-T1: No significant disc herniation. No spinal canal stenosis or neural foraminal narrowing. IMPRESSION: 1. No evidence of osteomyelitis in the cervical spine. 2. Small disc bulges at C3-4, C5-6, and C6-7 with mild spinal canal stenosis at each level; additional mild bilateral foraminal stenosis at C5-6. Electronically signed by: Franky Stanford MD 05/11/2024 10:40 PM EDT RP Workstation:  [id number]     Assessment/Plan: Axial skeleton and left hip and rt knee pain- r/o CPPD  E. coli bacteremia secondary to E. coli complicated UTI Right pyelonephritis on CT scan postvoid bladder scan OK Patient is currently on cefazolin  Because of worsening leucocytosis repeat CT abdomen done today to look for any renal abscess   Staph aureus bacteremia 1 out of 4 only by BCID but not in  culture. So question this to be a true pathogen Very Low bioburden Left arm wounds have no MSSA   These wounds were secondary to splattering of hot oil as per patient.   Culture has been sent Pt on cefazolin  blood culture neg  2D echo good- no veg The risk for endocarditis  is very low.so TEE not needed Depending on the first culture result we can finalize on discharge antibiotic- If no MSSA cultured then she could do Po antibiotic like amoxicillin  1 gram tid  until 05/16/24 for ecoli If MSSA in blood culture then she will need IV cefazolin  for a total of 2 weeks  Encephalopathy i resolved  Headache likely due to migraine Much better  COVID respiratory illness minimally symptomatic  COPD  Anemia  Leukocytosis  resolved  CAD status post CABG  History of left TKA  Discussed the management with the patient in detail.   Discussed with ID pharmacist

## 2024-05-13 DIAGNOSIS — N390 Urinary tract infection, site not specified: Secondary | ICD-10-CM | POA: Diagnosis not present

## 2024-05-13 DIAGNOSIS — B9620 Unspecified Escherichia coli [E. coli] as the cause of diseases classified elsewhere: Secondary | ICD-10-CM | POA: Diagnosis not present

## 2024-05-13 DIAGNOSIS — R7881 Bacteremia: Secondary | ICD-10-CM | POA: Diagnosis not present

## 2024-05-13 LAB — BASIC METABOLIC PANEL WITH GFR
Anion gap: 9 (ref 5–15)
BUN: 7 mg/dL — ABNORMAL LOW (ref 8–23)
CO2: 25 mmol/L (ref 22–32)
Calcium: 8.6 mg/dL — ABNORMAL LOW (ref 8.9–10.3)
Chloride: 102 mmol/L (ref 98–111)
Creatinine, Ser: 0.62 mg/dL (ref 0.44–1.00)
GFR, Estimated: >60 mL/min (ref >60)
Glucose, Bld: 101 mg/dL — ABNORMAL HIGH (ref 70–99)
Potassium: 4.3 mmol/L (ref 3.5–5.1)
Sodium: 136 mmol/L (ref 135–145)

## 2024-05-13 LAB — GLUCOSE, CAPILLARY
Glucose-Capillary: 115 mg/dL — ABNORMAL HIGH (ref 70–99)
Glucose-Capillary: 108 mg/dL — ABNORMAL HIGH (ref 70–99)
Glucose-Capillary: 110 mg/dL — ABNORMAL HIGH (ref 70–99)
Glucose-Capillary: 177 mg/dL — ABNORMAL HIGH (ref 70–99)

## 2024-05-13 LAB — CBC WITH DIFFERENTIAL/PLATELET
Abs Immature Granulocytes: 0.3 K/uL — ABNORMAL HIGH (ref 0.00–0.07)
Basophils Absolute: 0.1 K/uL (ref 0.0–0.1)
Basophils Relative: 1 %
Eosinophils Absolute: 0 K/uL (ref 0.0–0.5)
Eosinophils Relative: 0 %
HCT: 32.2 % — ABNORMAL LOW (ref 36.0–46.0)
Hemoglobin: 10.6 g/dL — ABNORMAL LOW (ref 12.0–15.0)
Immature Granulocytes: 2 %
Lymphocytes Relative: 17 %
Lymphs Abs: 2.2 K/uL (ref 0.7–4.0)
MCH: 31.7 pg (ref 26.0–34.0)
MCHC: 32.9 g/dL (ref 30.0–36.0)
MCV: 96.4 fL (ref 80.0–100.0)
Monocytes Absolute: 1.3 K/uL — ABNORMAL HIGH (ref 0.1–1.0)
Monocytes Relative: 10 %
Neutro Abs: 9 K/uL — ABNORMAL HIGH (ref 1.7–7.7)
Neutrophils Relative %: 70 %
Platelets: 571 K/uL — ABNORMAL HIGH (ref 150–400)
RBC: 3.34 MIL/uL — ABNORMAL LOW (ref 3.87–5.11)
RDW: 13.2 % (ref 11.5–15.5)
WBC: 12.9 K/uL — ABNORMAL HIGH (ref 4.0–10.5)
nRBC: 0 % (ref 0.0–0.2)

## 2024-05-13 LAB — PHOSPHORUS: Phosphorus: 3.2 mg/dL (ref 2.5–4.6)

## 2024-05-13 LAB — MAGNESIUM: Magnesium: 2.1 mg/dL (ref 1.7–2.4)

## 2024-05-13 MED ORDER — IBUPROFEN 400 MG PO TABS
200.0000 mg | ORAL_TABLET | Freq: Once | ORAL | Status: AC
  Administered 2024-05-13: 200 mg via ORAL
  Filled 2024-05-13: qty 1

## 2024-05-13 MED ORDER — ACETAMINOPHEN 500 MG PO TABS: 1000.0000 mg | ORAL_TABLET | Freq: Once | ORAL | Status: DC

## 2024-05-13 NOTE — Progress Notes (Signed)
 Physical Therapy Treatment Patient Details Name: [name] MRN: [id number] DOB: 1951/01/16 Today's Date: 05/13/2024   History of Present Illness presented to ER secondary to AMS; admitted for management of severe sepsis, acute metabolic encephalopathy related to UTI, Covid-19 virus.    PT Comments  Pt seen for modified session this pm due to sudden onset of chronic migraine complaint. Pt demonstrated Supervision for bed mobility and transfers in room as well as short distance gait with/without UE support. Pt states she is ready for d/c home where she cares for w/c bound husband. Pt appears at functional baseline once medically cleared for d/c.    If plan is discharge home, recommend the following: A little help with walking and/or transfers;A little help with bathing/dressing/bathroom   Can travel by private vehicle        Equipment Recommendations  Other (comment) (Pt without DME needs)    Recommendations for Other Services       Precautions / Restrictions Precautions Precautions: Fall Recall of Precautions/Restrictions: Intact Restrictions Weight Bearing Restrictions Per Provider Order: No     Mobility  Bed Mobility Overal bed mobility: Needs Assistance Bed Mobility: Sit to Supine       Sit to supine: Modified independent (Device/Increase time)   General bed mobility comments:  (No difficulty with bed mobility noted)    Transfers Overall transfer level: Needs assistance Equipment used: Rolling walker (2 wheels), None Transfers: Sit to/from Stand, Bed to chair/wheelchair/BSC Sit to Stand: Supervision           General transfer comment:  (Pt able to stand from [facility] without AD and perform hygiene without assist)    Ambulation/Gait Ambulation/Gait assistance: Supervision Gait Distance (Feet): 5 Feet Assistive device: Rolling walker (2 wheels) Gait Pattern/deviations: Step-through pattern Gait velocity: decr     General Gait Details:  (Gait distance  modified due to onset of Chronic migraine complaint)   Stairs             Wheelchair Mobility     Tilt Bed    Modified Rankin (Stroke Patients Only)       Balance Overall balance assessment: Needs assistance Sitting-balance support: No upper extremity supported, Feet supported Sitting balance-Leahy Scale: Good     Standing balance support: Bilateral upper extremity supported, During functional activity, Reliant on assistive device for balance Standing balance-Leahy Scale: Fair Standing balance comment:  (Pt relies on RW at baseline)                            Communication Communication Communication: No apparent difficulties  Cognition Arousal: Alert Behavior During Therapy: WFL for tasks assessed/performed   PT - Cognitive impairments: No apparent impairments                         Following commands: Intact      Cueing Cueing Techniques: Verbal cues, Gestural cues  Exercises Other Exercises Other Exercises:  (Pt educated on role of PT and initial discharge recs for HHPT services)    General Comments General comments (skin integrity, edema, etc.): Discussed POC and home set up with pt.      Pertinent Vitals/Pain Pain Assessment Pain Assessment: Faces Faces Pain Scale: Hurts even more Pain Location:  (HA/Migraine) Pain Descriptors / Indicators: Jabbing, Sharp Pain Intervention(s): Patient requesting pain meds-RN notified, Relaxation    Home Living  Prior Function            PT Goals (current goals can now be found in the care plan section) Acute Rehab PT Goals Patient Stated Goal: to return home Progress towards PT goals: Progressing toward goals    Frequency    Min 2X/week      PT Plan      Co-evaluation              AM-PAC PT 6 Clicks Mobility   Outcome Measure  Help needed turning from your back to your side while in a flat bed without using bedrails?: None Help  needed moving from lying on your back to sitting on the side of a flat bed without using bedrails?: None Help needed moving to and from a bed to a chair (including a wheelchair)?: A Little Help needed standing up from a chair using your arms (e.g., wheelchair or bedside chair)?: A Little Help needed to walk in hospital room?: A Little Help needed climbing 3-5 steps with a railing? : A Little 6 Click Score: 20    End of Session Equipment Utilized During Treatment: Oxygen  (2L O2 (baseline)) Activity Tolerance: Patient tolerated treatment well Patient left: in bed;with call bell/phone within reach;with bed alarm set Nurse Communication: Mobility status;Other (comment) (Meds for Migraine) PT Visit Diagnosis: Muscle weakness (generalized) (M62.81);Difficulty in walking, not elsewhere classified (R26.2)     Time: 8593-8572 PT Time Calculation (min) (ACUTE ONLY): 21 min  Charges:    $Therapeutic Activity: 8-22 mins PT General Charges $$ ACUTE PT VISIT: 1 Visit                    [name], PTA  [name] 05/13/2024, 2:38 PM

## 2024-05-13 NOTE — Consult Note (Signed)
 PHARMACY CONSULT NOTE - ELECTROLYTES  Pharmacy Consult for Electrolyte Monitoring and Replacement   Recent Labs: Height: 5' 0.5 (153.7 cm) Weight: 88 kg (194 lb) IBW/kg (Calculated) : 46.65 Estimated Creatinine Clearance: 64.4 mL/min (by C-G formula based on SCr of 0.62 mg/dL). Potassium (mmol/L)  Date Value  05/13/2024 4.3  08/30/2012 3.9   Magnesium  (mg/dL)  Date Value  90/76/7974 2.1   Calcium  (mg/dL)  Date Value  90/76/7974 8.6 (L)   Calcium , Total (mg/dL)  Date Value  98/89/7985 8.4 (L)   Albumin  (g/dL)  Date Value  90/83/7974 3.5   Phosphorus (mg/dL)  Date Value  90/76/7974 3.2   Sodium (mmol/L)  Date Value  05/13/2024 136  08/26/2020 137  08/30/2012 134 (L)   Corrected Ca: 9.2 mg/dL  Assessment  [name] is a 73 y.o. female presenting with sepsis with acute organ failure. PMH significant for morbid obesity, neuropathy, hypothyroid, GERD, non-insulin -dependent diabetes mellitus type 2, hyperlipidemia, CAD, COPD . Pharmacy has been consulted to monitor and replace electrolytes.  Diet: heart healthy/carb modified diet  Goal of Therapy: Electrolytes WNL  Plan:  No electrolyte replacement currently warranted Recheck BMP, Mg, Phos with AM labs  Thank you for allowing pharmacy to be a part of this patient's care.  Will M. Lenon, PharmD, BCPS Clinical Pharmacist 05/13/2024 7:46 AM

## 2024-05-13 NOTE — Progress Notes (Signed)
 Date of Admission:  05/06/2024      ID: [name] is a 73 y.o. female  Principal Problem:   Severe sepsis with acute organ dysfunction (HCC) Active Problems:   Type 2 diabetes mellitus with other specified complication (HCC)   OSA (obstructive sleep apnea)   CAD (coronary artery disease)   Hypothyroidism   Hypertension   Bipolar disorder (HCC)   Hyperlipidemia associated with type 2 diabetes mellitus (HCC)   GERD (gastroesophageal reflux disease)   Hepatic steatosis   S/P CABG x 1   Morbid obesity (HCC)   COVID-19 virus infection   Pressure injury of skin   E coli bacteremia   MSSA bacteremia   Pyelonephritis   Complicated UTI (urinary tract infection)  MRI cervical spine and lumbar spine No infection  Subjective: Patient feeling better She says her joint pains are much improved   Medications:   acetaminophen   1,000 mg Oral Once   aspirin  EC  81 mg Oral Daily   budesonide -glycopyrrolate -formoterol   2 puff Inhalation BID   diclofenac  Sodium  2 g Topical QID   famotidine   20 mg Oral QODAY   ferrous sulfate   325 mg Oral Q breakfast   gabapentin   300 mg Oral BID   heparin   5,000 Units Subcutaneous Q8H   insulin  aspart  0-20 Units Subcutaneous TID WC   insulin  aspart  0-5 Units Subcutaneous QHS   isosorbide  mononitrate  30 mg Oral BID   lactulose   20 g Oral BID   levothyroxine   100 mcg Oral QAC breakfast   metoprolol  succinate  12.5 mg Oral Daily   pantoprazole   40 mg Oral QODAY   polyethylene glycol  17 g Oral Daily   ranolazine   500 mg Oral BID   simvastatin   40 mg Oral QHS   ziprasidone   40 mg Oral BID WC    Objective: Vital signs in last 24 hours: Patient Vitals for the past 24 hrs:  BP Temp Temp src Pulse Resp SpO2  05/13/24 1514 (!) 140/66 97.6 F (36.4 C) Oral 77 18 100 %  05/13/24 0821 (!) 147/71 97.9 F (36.6 C) Oral 75 15 98 %  05/13/24 0500 136/70 98 F (36.7 C) Oral 78 20 98 %  05/12/24 2107 (!) 143/69 98.7 F (37.1 C) Oral 78 20 98 %       PHYSICAL EXAM:  General: Alert, cooperative, no distress, appears stated age.   Back: No CVA tenderness. Lungs: Clear to auscultation bilaterally. No Wheezing or Rhonchi. No rales. Heart: Regular rate and rhythm, no murmur, rub or gallop. Abdomen: Soft, hernia  extremities: Left forearm multiple healing wounds Skin: As above lymph: Cervical, supraclavicular normal. Neurologic: Grossly non-focal  Lab Results    Latest Ref Rng & Units 05/13/2024    5:42 AM 05/12/2024    5:24 AM 05/11/2024    5:44 AM  CBC  WBC 4.0 - 10.5 K/uL 12.9  16.7  15.5   Hemoglobin 12.0 - 15.0 g/dL 89.3  89.3  89.5   Hematocrit 36.0 - 46.0 % 32.2  31.2  30.2   Platelets 150 - 400 K/uL 571  463  429        Latest Ref Rng & Units 05/13/2024    5:42 AM 05/12/2024    5:24 AM 05/11/2024    5:44 AM  CMP  Glucose 70 - 99 mg/dL 898  890  867   BUN 8 - 23 mg/dL 7  9  7    Creatinine 0.44 -  1.00 mg/dL 9.37  9.37  9.30   Sodium 135 - 145 mmol/L 136  135  136   Potassium 3.5 - 5.1 mmol/L 4.3  3.4  3.5   Chloride 98 - 111 mmol/L 102  100  103   CO2 22 - 32 mmol/L 25  24  24    Calcium  8.9 - 10.3 mg/dL 8.6  8.3  8.2       Microbiology: 05/06/2024 blood culture both sets as E. coli And once that has Staph aureus on BCID but not in culture yet  Studies/Results: CT ABDOMEN PELVIS W CONTRAST Result Date: 05/12/2024 CLINICAL DATA:  Possible renal abscess. EXAM: CT ABDOMEN AND PELVIS WITH CONTRAST TECHNIQUE: Multidetector CT imaging of the abdomen and pelvis was performed using the standard protocol following bolus administration of intravenous contrast. RADIATION DOSE REDUCTION: This exam was performed according to the departmental dose-optimization program which includes automated exposure control, adjustment of the mA and/or kV according to patient size and/or use of iterative reconstruction technique. CONTRAST:  OMNIPAQUE  IOHEXOL  300 MG/ML  SOLN COMPARISON:  05/08/2024 FINDINGS: Lower chest: Trace left pleural  effusion.  Bibasilar atelectasis. Hepatobiliary: No focal hepatic abnormality. Gallbladder unremarkable. Pancreas: No focal abnormality or ductal dilatation. Spleen: No focal abnormality.  Normal size. Adrenals/Urinary Tract: Enhancement pattern of the kidneys appears normal. No abscess. No hydronephrosis. Adrenal glands and urinary bladder unremarkable. Stomach/Bowel: Colonic diverticulosis. No active diverticulitis. Stomach and small bowel decompressed. Vascular/Lymphatic: Aortic atherosclerosis. No evidence of aneurysm or adenopathy. Reproductive: No pelvic mass. Other: No free fluid or free air. Musculoskeletal: No acute bony abnormality. IMPRESSION: Normal enhancement of the kidneys. No evidence of pyelonephritis or abscess. No hydronephrosis. No acute findings in the abdomen or pelvis. Colonic diverticulosis. Aortic atherosclerosis. Electronically Signed   By: Franky Crease M.D.   On: 05/12/2024 22:54   MR Lumbar Spine W Wo Contrast Result Date: 05/11/2024 EXAM: MRI LUMBAR SPINE 05/11/2024 09:53:08 PM TECHNIQUE: Multiplanar multisequence MRI of the lumbar spine was performed with and without the administration of 9 mL intravenous gadobutrol  (GADAVIST ) 1 mmol/mL. COMPARISON: None available. CLINICAL HISTORY: Back pain, MSSA in BCID, r/o osteomyelitis. FINDINGS: BONES AND ALIGNMENT: Grade 1 anterolisthesis at L3-4. SPINAL CORD: The conus terminates normally. SOFT TISSUES: No paraspinal mass. L1-L2: No significant disc herniation. No spinal canal stenosis or neural foraminal narrowing. L2-L3: Small disc bulge with mild facet hypertrophy and mild spinal canal stenosis. L3-L4: Moderate facet hypertrophy. No central spinal canal or neural foraminal stenosis. L4-L5: Grade 1 anterolisthesis with mild facet hypertrophy and left foraminal annular fissure. No central spinal canal or neural foraminal stenosis. L5-S1: Small disc bulge. No central spinal canal or neural foraminal stenosis. IMPRESSION: 1. Mild spinal canal  stenosis at L2-3. 2. Grade 1 anterolisthesis at L3-4 and L4-5. 3. No evidence of osteomyelitis. Electronically signed by: Franky Stanford MD 05/11/2024 10:45 PM EDT RP Workstation: [id number]   MR CERVICAL SPINE W WO CONTRAST Result Date: 05/11/2024 EXAM: MRI CERVICAL SPINE WITH AND WITHOUT CONTRAST 05/11/2024 09:53:08 PM TECHNIQUE: Multiplanar multisequence MRI of the cervical spine was performed with and without intravenous contrast. 9 mL of gadobutrol  (GADAVIST ) 1 mmol/mL was administered. COMPARISON: None available. CLINICAL HISTORY: Neck pain, has MSSA on BCID looking for osteomyelitis of cervical spine. FINDINGS: BONES AND ALIGNMENT: Grade 1 anterolisthesis at C3-C4. C2-C3 right facet hypertrophy. No evidence of acute fracture or osteomyelitis. SPINAL CORD: Normal spinal cord size. No abnormal spinal cord signal. SOFT TISSUES: No paraspinal mass. C2-C3: Right facet hypertrophy. C3-C4: Small disc bulge with mild  spinal canal stenosis. C4-C5: No significant disc herniation. No spinal canal stenosis or neural foraminal narrowing. C5-C6: Small disc bulge with uncovertebral spurring, mild spinal canal stenosis and mild bilateral foraminal stenosis. C6-C7: Small disc bulge with uncovertebral hypertrophy, mild spinal canal stenosis. C7-T1: No significant disc herniation. No spinal canal stenosis or neural foraminal narrowing. IMPRESSION: 1. No evidence of osteomyelitis in the cervical spine. 2. Small disc bulges at C3-4, C5-6, and C6-7 with mild spinal canal stenosis at each level; additional mild bilateral foraminal stenosis at C5-6. Electronically signed by: Franky Stanford MD 05/11/2024 10:40 PM EDT RP Workstation: [id number]     Assessment/Plan: Axial skeleton and left hip and rt knee pain- r/o CPPD  E. coli bacteremia secondary to E. coli complicated UTI Right pyelonephritis on CT scan postvoid bladder scan OK Patient is currently on cefazolin  Because of worsening leucocytosis repeat CT abdomen done  today to look for any renal abscess   Staph aureus was identified by [facility] ID and one of the bottles but the culture has been negative..  So unlikely this is a true pathogen.  Left arm wounds have no MSSA  there is no need to treat this anymore.   These wounds were secondary to splattering of hot oil as per patient.   Culture has been sent Pt on cefazolin  blood culture neg  2D echo good- no veg The risk for endocarditis is very low.so TEE not needed Patient is currently on cefazolin  and on discharge would be placed on  amoxicillin  1 gram tid  until 05/16/24 for ecoli   Encephalopathy has resolved  Headache likely due to migraine Much better  COVID respiratory illness minimally symptomatic  COPD  Anemia  Leukocytosis  resolved  CAD status post CABG  History of left TKA  Discussed the management with the patient in detail.   Discussed with hospitalist and the ID pharmacist. I will sign off now.  Call if needed.

## 2024-05-13 NOTE — Progress Notes (Signed)
 Progress Note   Patient: [name] FMW:[id number] DOB: [birth year] DOA: 05/06/2024     7 DOS: the patient was seen and examined on 05/13/2024   Brief hospital course: Ms. [name] is a 73 year old female with history of morbid obesity, neuropathy, hypothyroid, GERD, non-insulin -dependent diabetes mellitus type 2, hyperlipidemia, CAD, COPD, who presents ED from home for chief concerns of altered mentation.  Vitals in the ED showed t of 98.2, rr 21, hr 97, blood pressure 154/64, SpO2 100% on room air.  Serum sodium is 133, potassium 40.9, chloride 97, bicarb 20, BUN 15, serum creatinine 1.10, eGFR 54, nonfasting blood glucose 154, WBC 25.4, hemoglobin 12.8, platelets 378.  UA was positive for large leukocytes and positive for nitrates.  ED treatment: Cefepime , Flagyl , vancomycin , LR 1 L bolus.  Assessment and Plan: * Severe sepsis with acute organ dysfunction Resolved R sided pyelonephritis  E coli bacteremia   Patient with markedly elevated leukocytosis of 25.4, elevated heart rate and respiration rate, organ derangement is metabolic encephalopathy,found to have R sided pyelonephritis. Sepsis physiology resolved.  CT abd pelvis repeat due to worsening leukocytosis no renal abscess  BCX with ecoli , staph aureus on BC ID  One bottle with Staph epidermidis  BCID with MSSA, Bcx curently not growing MSSA UCX with ecoli  Wound cx of upper extremities with staph epidermidis  TTE without evidence of valve vegetation Had pain in neck and back, MRI of c and L spine negative for osteo  Leukocytosis improved this AM.  -Per ID can d/c on amoxicillin  1g TID until 9/26 for ecoli    COVID-19 virus infection Incidentally discovered. Asx.  Symptomatic support: Albuterol  inhaler every 6 hours as needed for wheezing and shortness of breath  Morbid obesity (HCC) BMI 37.  This complicates overall care and prognosis.   Mild normocytic anemia     Latest Ref Rng & Units 05/13/2024     5:42 AM 05/12/2024    5:24 AM 05/11/2024    5:44 AM  CBC  WBC 4.0 - 10.5 K/uL 12.9  16.7  15.5   Hemoglobin 12.0 - 15.0 g/dL 89.3  89.3  89.5   Hematocrit 36.0 - 46.0 % 32.2  31.2  30.2   Platelets 150 - 400 K/uL 571  463  429   Iron /TIBC/Ferritin/ %Sat    Component Value Date/Time   IRON  18 (L) 05/11/2024 0544   TIBC 231 (L) 05/11/2024 0544   FERRITIN 185 05/11/2024 0544   IRONPCTSAT 8 (L) 05/11/2024 0544   Iron  deficiency noted. Continue PO iron   Needs to see PCP for further evaluation of this   Mild hyponatremia     Latest Ref Rng & Units 05/13/2024    5:42 AM 05/12/2024    5:24 AM 05/11/2024    5:44 AM  BMP  Glucose 70 - 99 mg/dL 898  890  867   BUN 8 - 23 mg/dL 7  9  7    Creatinine 0.44 - 1.00 mg/dL 9.37  9.37  9.30   Sodium 135 - 145 mmol/L 136  135  136   Potassium 3.5 - 5.1 mmol/L 4.3  3.4  3.5   Chloride 98 - 111 mmol/L 102  100  103   CO2 22 - 32 mmol/L 25  24  24    Calcium  8.9 - 10.3 mg/dL 8.6  8.3  8.2    Resolved  Mild metabolic acidosis resolved   AKI Resolved.    GERD (gastroesophageal reflux disease) Patient on famotidine  and  Protonix   CAD Hyperlipidemia associated with type 2 diabetes mellitus (HCC) Home simvastatin  40 mg nightly  Continue home imdur  and ranolazine    Hypertension Well-controlled home Imdur   Hypothyroidism Home levothyroxine  100 mcg daily before breakfast  OSA (obstructive sleep apnea) CPAP nightly ordered  Type 2 diabetes mellitus with other specified complication (HCC) Well-controlled most recent A1c 6 Home oral antihyperglycemic agents will not be resumed on admission Insulin  SSI with at bedtime coverage ordered  CBG (last 3)  Recent Labs    05/13/24 0823 05/13/24 1217 05/13/24 1613  GLUCAP 115* 108* 110*         Subjective: No issues overnight.   Physical Exam: Vitals:   05/12/24 2107 05/13/24 0500 05/13/24 0821 05/13/24 1514  BP: (!) 143/69 136/70 (!) 147/71 (!) 140/66  Pulse: 78 78 75 77  Resp: 20 20  15 18   Temp: 98.7 F (37.1 C) 98 F (36.7 C) 97.9 F (36.6 C) 97.6 F (36.4 C)  TempSrc: Oral Oral Oral Oral  SpO2: 98% 98% 98% 100%  Weight:      Height:           Physical Exam  Constitutional: In no distress.  Cardiovascular: Normal rate, regular rhythm. No lower extremity edema  Pulmonary: Non labored breathing on room air, no wheezing or rales.   Abdominal: Soft. Non distended and non tender     Neurological: Alert and oriented to person, place, and time. Non focal  Skin: Skin is warm and dry.      Data Reviewed:     Latest Ref Rng & Units 05/13/2024    5:42 AM 05/12/2024    5:24 AM 05/11/2024    5:44 AM  CBC  WBC 4.0 - 10.5 K/uL 12.9  16.7  15.5   Hemoglobin 12.0 - 15.0 g/dL 89.3  89.3  89.5   Hematocrit 36.0 - 46.0 % 32.2  31.2  30.2   Platelets 150 - 400 K/uL 571  463  429       Latest Ref Rng & Units 05/13/2024    5:42 AM 05/12/2024    5:24 AM 05/11/2024    5:44 AM  BMP  Glucose 70 - 99 mg/dL 898  890  867   BUN 8 - 23 mg/dL 7  9  7    Creatinine 0.44 - 1.00 mg/dL 9.37  9.37  9.30   Sodium 135 - 145 mmol/L 136  135  136   Potassium 3.5 - 5.1 mmol/L 4.3  3.4  3.5   Chloride 98 - 111 mmol/L 102  100  103   CO2 22 - 32 mmol/L 25  24  24    Calcium  8.9 - 10.3 mg/dL 8.6  8.3  8.2       Family Communication: None at bedside  Disposition: Status is: Inpatient Remains inpatient appropriate because: IV antibiotics   Planned Discharge Destination: Home in the AM     Time spent: 35 minutes  Author: Alban Pepper, MD 05/13/2024 7:16 PM  For on call review [email or web address].

## 2024-05-13 NOTE — Plan of Care (Signed)
  Problem: Coping: Goal: Ability to adjust to condition or change in health will improve Outcome: Progressing   Problem: Education: Goal: Knowledge of General Education information will improve Description: Including pain rating scale, medication(s)/side effects and non-pharmacologic comfort measures Outcome: Progressing   Problem: Coping: Goal: Level of anxiety will decrease Outcome: Progressing   Problem: Pain Managment: Goal: General experience of comfort will improve and/or be controlled Outcome: Progressing   Problem: Safety: Goal: Ability to remain free from injury will improve Outcome: Progressing

## 2024-05-13 NOTE — Progress Notes (Signed)
   05/13/24 1600  Mobility  Activity Refused and notified nurse if applicable   Mobility Specialist - Progress Note Pt was supine in bed upon entry. Pt refused mobility due to a migraine. Will try again at another time

## 2024-05-14 ENCOUNTER — Other Ambulatory Visit: Payer: Self-pay

## 2024-05-14 DIAGNOSIS — R6520 Severe sepsis without septic shock: Secondary | ICD-10-CM | POA: Diagnosis not present

## 2024-05-14 DIAGNOSIS — A419 Sepsis, unspecified organism: Secondary | ICD-10-CM | POA: Diagnosis not present

## 2024-05-14 LAB — CBC
HCT: 31.6 % — ABNORMAL LOW (ref 36.0–46.0)
Hemoglobin: 10.5 g/dL — ABNORMAL LOW (ref 12.0–15.0)
MCH: 31.4 pg (ref 26.0–34.0)
MCHC: 33.2 g/dL (ref 30.0–36.0)
MCV: 94.6 fL (ref 80.0–100.0)
Platelets: 645 K/uL — ABNORMAL HIGH (ref 150–400)
RBC: 3.34 MIL/uL — ABNORMAL LOW (ref 3.87–5.11)
RDW: 13.1 % (ref 11.5–15.5)
WBC: 10.6 K/uL — ABNORMAL HIGH (ref 4.0–10.5)
nRBC: 0 % (ref 0.0–0.2)

## 2024-05-14 LAB — BASIC METABOLIC PANEL WITH GFR
Anion gap: 13 (ref 5–15)
BUN: 9 mg/dL (ref 8–23)
CO2: 23 mmol/L (ref 22–32)
Calcium: 8.9 mg/dL (ref 8.9–10.3)
Chloride: 101 mmol/L (ref 98–111)
Creatinine, Ser: 0.65 mg/dL (ref 0.44–1.00)
GFR, Estimated: >60 mL/min (ref >60)
Glucose, Bld: 111 mg/dL — ABNORMAL HIGH (ref 70–99)
Potassium: 4.4 mmol/L (ref 3.5–5.1)
Sodium: 137 mmol/L (ref 135–145)

## 2024-05-14 LAB — CULTURE, BLOOD (ROUTINE X 2)
Culture: NO GROWTH
Culture: NO GROWTH
Special Requests: ADEQUATE
Special Requests: ADEQUATE
Special Requests: ADEQUATE

## 2024-05-14 LAB — PHOSPHORUS: Phosphorus: 3.7 mg/dL (ref 2.5–4.6)

## 2024-05-14 LAB — MAGNESIUM: Magnesium: 2.3 mg/dL (ref 1.7–2.4)

## 2024-05-14 LAB — GLUCOSE, CAPILLARY: Glucose-Capillary: 114 mg/dL — ABNORMAL HIGH (ref 70–99)

## 2024-05-14 MED ORDER — AMOXICILLIN 500 MG PO CAPS
1000.0000 mg | ORAL_CAPSULE | Freq: Three times a day (TID) | ORAL | 0 refills | Status: AC
  Filled 2024-05-14: qty 18, 3d supply, fill #0

## 2024-05-14 NOTE — TOC Progression Note (Signed)
 Transition of Care [facility]) - Progression Note    Patient Details  Name: [name] MRN: [id number] Date of Birth: [birth year]  [facility] CM/SW Contact  Alvaro Louder, [place] Phone Number: 05/14/2024, 9:29 AM  Clinical Narrative:   Patient still is declining HH services. No TOC needs at this time   TOC to follow for discharge  Expected Discharge Plan: Home/Self Care Barriers to Discharge: No Barriers Identified               Expected Discharge Plan and Services                                               Social Drivers of Health (SDOH) Interventions SDOH Screenings   Food Insecurity: No Food Insecurity (05/07/2024)  Housing: Low Risk  (05/07/2024)  Transportation Needs: No Transportation Needs (05/07/2024)  Utilities: Not At Risk (05/07/2024)  Alcohol Screen: Low Risk  (02/08/2024)  Depression (PHQ2-9): Low Risk  (02/12/2024)  Financial Resource Strain: Low Risk  (02/08/2024)  Physical Activity: Insufficiently Active (02/08/2024)  Social Connections: Moderately Isolated (05/07/2024)  Stress: No Stress Concern Present (02/08/2024)  Tobacco Use: Medium Risk (05/06/2024)  Health Literacy: Adequate Health Literacy (02/12/2024)    Readmission Risk Interventions     No data to display

## 2024-05-14 NOTE — Plan of Care (Signed)
   Problem: Nutritional: Goal: Maintenance of adequate nutrition will improve Outcome: Progressing   Problem: Education: Goal: Knowledge of General Education information will improve Description: Including pain rating scale, medication(s)/side effects and non-pharmacologic comfort measures Outcome: Progressing

## 2024-05-14 NOTE — Discharge Summary (Signed)
 [facility] Discharge Summary   Patient: [name] FMW:[id number]  PCP: Rilla Baller, MD  Date of admission: 05/06/2024   Date of discharge:  05/14/2024     Discharge Diagnoses:  Principal Problem:   Severe sepsis with acute organ dysfunction [facility]) Active Problems:   Type 2 diabetes mellitus with other specified complication (HCC)   OSA (obstructive sleep apnea)   CAD (coronary artery disease)   Hypothyroidism   Hypertension   Bipolar disorder (HCC)   Hyperlipidemia associated with type 2 diabetes mellitus (HCC)   GERD (gastroesophageal reflux disease)   Hepatic steatosis   S/P CABG x 1   Morbid obesity (HCC)   COVID-19 virus infection   Pressure injury of skin   E coli bacteremia   MSSA bacteremia   Pyelonephritis   Complicated UTI (urinary tract infection)   Admitted From: [facility] Disposition:  Home, she declined home health services as per [facility]  Recommendations for Outpatient Follow-up:  PCP: in 1 wk Follow up LABS/TEST:     Follow-up Information     Rilla Baller, MD Follow up in 1 week(s).   Specialty: Family Medicine Contact information: [address] [place] [address] [phone]                Diet recommendation: Cardiac and Carb modified diet  Activity: The patient is advised to gradually reintroduce usual activities, as tolerated  Discharge Condition: stable  Code Status: Full code   History of present illness: As per the H and P dictated on admission.  Hospital Course:  [name] is a 73 year old female with history of morbid obesity, neuropathy, hypothyroid, GERD, non-insulin -dependent diabetes mellitus type 2, hyperlipidemia, CAD, COPD, who presents ED from home for chief concerns of altered mentation.   Vitals in the ED showed t of 98.2, rr 21, hr 97, blood pressure 154/64, SpO2 100% on room air.   Serum sodium is 133, potassium 40.9, chloride 97, bicarb 20, BUN 15, serum creatinine 1.10, eGFR  54, nonfasting blood glucose 154, WBC 25.4, hemoglobin 12.8, platelets 378.   UA was positive for large leukocytes and positive for nitrates.   ED treatment: Cefepime , Flagyl , vancomycin , LR 1 L bolus.   Assessment and Plan:  # Severe sepsis with acute organ dysfunction Resolved R sided pyelonephritis. E coli bacteremia  Patient with markedly elevated leukocytosis of 25.4, elevated heart rate and respiration rate, organ derangement is metabolic encephalopathy,found to have R sided pyelonephritis. Sepsis physiology resolved.  CT abd pelvis repeat due to worsening leukocytosis no renal abscess BCX with ecoli , staph aureus on BC ID  One bottle with Staph epidermidis  BCID with MSSA, Bcx curently not growing MSSA UCX with ecoli  Wound cx of upper extremities with staph epidermidis  TTE without evidence of valve vegetation Had pain in neck and back, MRI of c and L spine negative for osteo Leukocytosis wbc 16.7>>10.6 improved this AM.  -Per ID can d/c on amoxicillin  1g TID until 9/26 for ecoli      # COVID-19 virus infection: Incidentally discovered. Asx.  Symptomatic support: Albuterol  inhaler every 6 hours as needed for wheezing and shortness of breath   Chronic medical conditions: CAD, HTN, COPD, DM T2/neuropathy, hypothyroid. Resumed home medications   Body mass index is 37.26 kg/m.  Nutrition Interventions:  Wound 05/06/24 1806 Pressure Injury Sacrum Stage 2 -  Partial thickness loss of dermis presenting as a shallow open injury with a red, pink wound bed without slough. (Active)  Patient was seen by physical therapy, who recommended Home health, but patient declined as per TOC.   On the day of the discharge the patient's vitals were stable, and no other acute medical condition were reported by patient. the patient was felt safe to be discharge at Home.  Consultants: ID Procedures: NOne  Discharge Exam: General: Appear in no distress, Oral Mucosa Clear,  moist. Cardiovascular: S1 and S2 Present, no Murmur, Respiratory: normal respiratory effort, Bilateral Air entry present and no Crackles, no wheezes Abdomen: Bowel Sound present, Soft and no tenderness. Extremities: no Pedal edema, no calf tenderness Neurology: alert and oriented to time, place, and person affect appropriate.  Filed Weights   05/06/24 1419  Weight: 88 kg   Vitals:   05/14/24 0510 05/14/24 0815  BP: 126/70 (!) 146/93  Pulse: 61 77  Resp: 16 18  Temp: 97.8 F (36.6 C) (!) 97.5 F (36.4 C)  SpO2: 98% 99%    DISCHARGE MEDICATION: Allergies as of 05/14/2024       Reactions   Crestor [rosuvastatin] Other (See Comments)   myalgias   Lipitor [atorvastatin ] Other (See Comments)   Mood swings   Lithium Other (See Comments)   abd pain, n/v, was hospitalized   Quetiapine Other (See Comments)   Muscle twitching, muscle spasm        Medication List     STOP taking these medications    aspirin -acetaminophen -caffeine  250-250-65 MG tablet Commonly known as: EXCEDRIN MIGRAINE   IBUPROFEN  PO   multivitamin with minerals Tabs tablet   mupirocin  ointment 2 % Commonly known as: BACTROBAN        TAKE these medications    albuterol  108 (90 Base) MCG/ACT inhaler Commonly known as: VENTOLIN  HFA Inhale 2 puffs into the lungs every 6 (six) hours as needed for wheezing or shortness of breath.   amoxicillin  500 MG tablet Commonly known as: AMOXIL  Take 2 tablets (1,000 mg total) by mouth 3 (three) times daily for 3 days.   aspirin  EC 81 MG tablet Take 1 tablet (81 mg total) by mouth daily.   Breztri  Aerosphere 160-9-4.8 MCG/ACT Aero inhaler Generic drug: budesonide -glycopyrrolate -formoterol  Inhale 2 puffs into the lungs in the morning and at bedtime.   CALCIUM  + VITAMIN D3 PO Take by mouth daily.   cyclobenzaprine  10 MG tablet Commonly known as: FLEXERIL  TAKE 1/2 TO 1 TABLET BY MOUTH TWICE A DAY AS NEEDED FOR MUSCLE SPASMS AND MIGRAINE   ESTROVEN  COMPLETE PO Take 1 tablet by mouth daily.   famotidine  20 MG tablet Commonly known as: PEPCID  Take 20 mg by mouth every other day. Alternates with omeprazole    gabapentin  300 MG capsule Commonly known as: NEURONTIN  Take 1 capsule (300 mg total) by mouth 2 (two) times daily.   Iron  (Ferrous Sulfate ) 325 (65 Fe) MG Tabs Take 325 mg by mouth once a week. What changed: additional instructions   isosorbide  mononitrate 60 MG 24 hr tablet Commonly known as: IMDUR  TAKE 0.5 TABLETS (30 MG TOTAL) BY MOUTH 2 (TWO) TIMES DAILY.   levothyroxine  100 MCG tablet Commonly known as: SYNTHROID  Take 1 tablet (100 mcg total) by mouth daily before breakfast.   loratadine  10 MG tablet Commonly known as: CLARITIN  Take 1 tablet (10 mg total) by mouth daily.   metFORMIN  500 MG tablet Commonly known as: GLUCOPHAGE  Take 1 tablet (500 mg total) by mouth daily with breakfast.   metoprolol  succinate 25 MG 24 hr tablet Commonly known as: TOPROL -XL Take 0.5 tablets (12.5 mg total) by mouth  daily.   omeprazole  20 MG capsule Commonly known as: PRILOSEC Take 1 capsule (20 mg total) by mouth every other day.   OneTouch Delica Plus Lancet33G Misc Use as instructed to check blood sugar 2 times a day   OneTouch Ultra Test test strip Generic drug: glucose blood Use as instructed to check sugars twice daily E11.69   OneTouch Verio test strip Generic drug: glucose blood USE AS INSTRUCTED TO CHECK BLOOD SUGAR 2 TIMES A DAY   OneTouch Verio Reflect w/Device Kit 1 KIT BY DOES NOT APPLY ROUTE ONCE FOR 1 DOSE. USE AS INSTRUCTED TO CHECK BLOOD SUGAR ONCE A DAY   OSTEO BI-FLEX ONE PER DAY PO Take 1 tablet by mouth daily.   OXYGEN  Inhale 2 L into the lungs at bedtime.   Ozempic  (2 MG/DOSE) 8 MG/3ML Sopn Generic drug: Semaglutide  (2 MG/DOSE) INJECT 2 MG AS DIRECTED ONCE A WEEK.   ranolazine  500 MG 12 hr tablet Commonly known as: Ranexa  Take 1 tablet (500 mg total) by mouth 2 (two) times daily.    simvastatin  40 MG tablet Commonly known as: ZOCOR  Take 1 tablet (40 mg total) by mouth at bedtime.   torsemide  20 MG tablet Commonly known as: DEMADEX  Take 1.5 tablets (30 mg total) by mouth daily.   traMADol  50 MG tablet Commonly known as: ULTRAM  TAKE 1 TABLET (50 MG TOTAL) BY MOUTH TWICE A DAY AS NEEDED FOR MODERATE PAIN   Turmeric 500 MG Caps Take 1 capsule by mouth 2 (two) times daily as needed.   Tylenol  8 Hour Arthritis Pain 650 MG CR tablet Generic drug: acetaminophen  Take 650 mg by mouth every 8 (eight) hours as needed for pain.   vitamin B-12 500 MCG tablet Commonly known as: CYANOCOBALAMIN  Take 1 tablet (500 mcg total) by mouth 2 (two) times a week.   ziprasidone  40 MG capsule Commonly known as: GEODON  Take 1 capsule (40 mg total) by mouth 2 (two) times daily with a meal.       Allergies  Allergen Reactions   Crestor [Rosuvastatin] Other (See Comments)    myalgias   Lipitor [Atorvastatin ] Other (See Comments)    Mood swings   Lithium Other (See Comments)    abd pain, n/v, was hospitalized   Quetiapine Other (See Comments)    Muscle twitching, muscle spasm   Discharge Instructions     Call MD for:  difficulty breathing, headache or visual disturbances   Complete by: As directed    Call MD for:  extreme fatigue   Complete by: As directed    Call MD for:  persistant dizziness or light-headedness   Complete by: As directed    Call MD for:  persistant nausea and vomiting   Complete by: As directed    Call MD for:  severe uncontrolled pain   Complete by: As directed    Call MD for:  temperature >100.4   Complete by: As directed    Diet - low sodium heart healthy   Complete by: As directed    Diet Carb Modified   Complete by: As directed    Discharge instructions   Complete by: As directed    Follow-up with PCP in 1 week   Increase activity slowly   Complete by: As directed    No wound care   Complete by: As directed        The results of  significant diagnostics from this hospitalization (including imaging, microbiology, ancillary and laboratory) are listed below for reference.  Significant Diagnostic Studies: CT ABDOMEN PELVIS W CONTRAST Result Date: 05/12/2024 CLINICAL DATA:  Possible renal abscess. EXAM: CT ABDOMEN AND PELVIS WITH CONTRAST TECHNIQUE: Multidetector CT imaging of the abdomen and pelvis was performed using the standard protocol following bolus administration of intravenous contrast. RADIATION DOSE REDUCTION: This exam was performed according to the departmental dose-optimization program which includes automated exposure control, adjustment of the mA and/or kV according to patient size and/or use of iterative reconstruction technique. CONTRAST:  OMNIPAQUE  IOHEXOL  300 MG/ML  SOLN COMPARISON:  05/08/2024 FINDINGS: Lower chest: Trace left pleural effusion.  Bibasilar atelectasis. Hepatobiliary: No focal hepatic abnormality. Gallbladder unremarkable. Pancreas: No focal abnormality or ductal dilatation. Spleen: No focal abnormality.  Normal size. Adrenals/Urinary Tract: Enhancement pattern of the kidneys appears normal. No abscess. No hydronephrosis. Adrenal glands and urinary bladder unremarkable. Stomach/Bowel: Colonic diverticulosis. No active diverticulitis. Stomach and small bowel decompressed. Vascular/Lymphatic: Aortic atherosclerosis. No evidence of aneurysm or adenopathy. Reproductive: No pelvic mass. Other: No free fluid or free air. Musculoskeletal: No acute bony abnormality. IMPRESSION: Normal enhancement of the kidneys. No evidence of pyelonephritis or abscess. No hydronephrosis. No acute findings in the abdomen or pelvis. Colonic diverticulosis. Aortic atherosclerosis. Electronically Signed   By: Franky Crease M.D.   On: 05/12/2024 22:54   MR Lumbar Spine W Wo Contrast Result Date: 05/11/2024 EXAM: MRI LUMBAR SPINE 05/11/2024 09:53:08 PM TECHNIQUE: Multiplanar multisequence MRI of the lumbar spine was performed  with and without the administration of 9 mL intravenous gadobutrol  (GADAVIST ) 1 mmol/mL. COMPARISON: None available. CLINICAL HISTORY: Back pain, MSSA in BCID, r/o osteomyelitis. FINDINGS: BONES AND ALIGNMENT: Grade 1 anterolisthesis at L3-4. SPINAL CORD: The conus terminates normally. SOFT TISSUES: No paraspinal mass. L1-L2: No significant disc herniation. No spinal canal stenosis or neural foraminal narrowing. L2-L3: Small disc bulge with mild facet hypertrophy and mild spinal canal stenosis. L3-L4: Moderate facet hypertrophy. No central spinal canal or neural foraminal stenosis. L4-L5: Grade 1 anterolisthesis with mild facet hypertrophy and left foraminal annular fissure. No central spinal canal or neural foraminal stenosis. L5-S1: Small disc bulge. No central spinal canal or neural foraminal stenosis. IMPRESSION: 1. Mild spinal canal stenosis at L2-3. 2. Grade 1 anterolisthesis at L3-4 and L4-5. 3. No evidence of osteomyelitis. Electronically signed by: Franky Stanford MD 05/11/2024 10:45 PM EDT RP Workstation: [id number]   MR CERVICAL SPINE W WO CONTRAST Result Date: 05/11/2024 EXAM: MRI CERVICAL SPINE WITH AND WITHOUT CONTRAST 05/11/2024 09:53:08 PM TECHNIQUE: Multiplanar multisequence MRI of the cervical spine was performed with and without intravenous contrast. 9 mL of gadobutrol  (GADAVIST ) 1 mmol/mL was administered. COMPARISON: None available. CLINICAL HISTORY: Neck pain, has MSSA on BCID looking for osteomyelitis of cervical spine. FINDINGS: BONES AND ALIGNMENT: Grade 1 anterolisthesis at C3-C4. C2-C3 right facet hypertrophy. No evidence of acute fracture or osteomyelitis. SPINAL CORD: Normal spinal cord size. No abnormal spinal cord signal. SOFT TISSUES: No paraspinal mass. C2-C3: Right facet hypertrophy. C3-C4: Small disc bulge with mild spinal canal stenosis. C4-C5: No significant disc herniation. No spinal canal stenosis or neural foraminal narrowing. C5-C6: Small disc bulge with uncovertebral  spurring, mild spinal canal stenosis and mild bilateral foraminal stenosis. C6-C7: Small disc bulge with uncovertebral hypertrophy, mild spinal canal stenosis. C7-T1: No significant disc herniation. No spinal canal stenosis or neural foraminal narrowing. IMPRESSION: 1. No evidence of osteomyelitis in the cervical spine. 2. Small disc bulges at C3-4, C5-6, and C6-7 with mild spinal canal stenosis at each level; additional mild bilateral foraminal stenosis at C5-6. Electronically signed by: Franky  Alexa MD 05/11/2024 10:40 PM EDT RP Workstation: [id number]   ECHOCARDIOGRAM COMPLETE Result Date: 05/09/2024    ECHOCARDIOGRAM REPORT   Patient Name:   [name] Date of Exam: 05/09/2024 Medical Rec #:  [id number]       Height:       60.5 in Accession #:    [id number]      Weight:       194.0 lb Date of Birth:  February 01, 1951       BSA:          1.854 m Patient Age:    73 years        BP:           113/50 mmHg Patient Gender: F               HR:           76 bpm. Exam Location:  [facility] Procedure: 2D Echo, Cardiac Doppler and Color Doppler (Both Spectral and Color            Flow Doppler were utilized during procedure). Indications:     Bacteremia R78.81  History:         Patient has prior history of Echocardiogram examinations, most                  recent 03/24/2022. Prior CABG; Signs/Symptoms:Bacteremia.  Sonographer:     Rosina Dunk Referring Phys:  [id number] DONALD BERLIN Diagnosing Phys: Evalene Lunger MD  Sonographer Comments: Technically difficult study due to poor echo windows. IMPRESSIONS  1. Left ventricular ejection fraction, by estimation, is 60 to 65%. The left ventricle has normal function. The left ventricle has no regional wall motion abnormalities. Left ventricular diastolic parameters are consistent with Grade I diastolic dysfunction (impaired relaxation).  2. Right ventricular systolic function is normal. The right ventricular size is normal.  3. The mitral valve is normal in structure.  Trivial mitral valve regurgitation. No evidence of mitral stenosis.  4. The aortic valve is normal in structure. Aortic valve regurgitation is not visualized. Aortic valve sclerosis is present, with no evidence of aortic valve stenosis.  5. The inferior vena cava is normal in size with greater than 50% respiratory variability, suggesting right atrial pressure of 3 mmHg.  6. No valve vegetation noted. Valves well visualized. FINDINGS  Left Ventricle: Left ventricular ejection fraction, by estimation, is 60 to 65%. The left ventricle has normal function. The left ventricle has no regional wall motion abnormalities. Strain was performed and the global longitudinal strain is indeterminate. The left ventricular internal cavity size was normal in size. There is no left ventricular hypertrophy. Left ventricular diastolic parameters are consistent with Grade I diastolic dysfunction (impaired relaxation). Right Ventricle: The right ventricular size is normal. No increase in right ventricular wall thickness. Right ventricular systolic function is normal. Left Atrium: Left atrial size was normal in size. Right Atrium: Right atrial size was normal in size. Pericardium: There is no evidence of pericardial effusion. Mitral Valve: The mitral valve is normal in structure. Mild mitral annular calcification. Trivial mitral valve regurgitation. No evidence of mitral valve stenosis. MV peak gradient, 4.1 mmHg. The mean mitral valve gradient is 2.0 mmHg. Tricuspid Valve: The tricuspid valve is normal in structure. Tricuspid valve regurgitation is mild . No evidence of tricuspid stenosis. Aortic Valve: The aortic valve is normal in structure. Aortic valve regurgitation is not visualized. Aortic valve sclerosis is present, with no evidence of aortic valve stenosis. Aortic valve mean gradient measures  3.0 mmHg. Aortic valve peak gradient measures 6.0 mmHg. Aortic valve area, by VTI measures 2.63 cm. Pulmonic Valve: The pulmonic valve was  normal in structure. Pulmonic valve regurgitation is not visualized. No evidence of pulmonic stenosis. Aorta: The aortic root is normal in size and structure. Venous: The inferior vena cava is normal in size with greater than 50% respiratory variability, suggesting right atrial pressure of 3 mmHg. IAS/Shunts: No atrial level shunt detected by color flow Doppler. Additional Comments: 3D was performed not requiring image post processing on an independent workstation and was indeterminate.  LEFT VENTRICLE PLAX 2D LVIDd:         4.20 cm     Diastology LVIDs:         3.00 cm     LV e' medial:    5.11 cm/s LV PW:         1.60 cm     LV E/e' medial:  12.1 LV IVS:        0.90 cm     LV e' lateral:   8.92 cm/s LVOT diam:     2.10 cm     LV E/e' lateral: 6.9 LV SV:         49 LV SV Index:   27 LVOT Area:     3.46 cm  LV Volumes (MOD) LV vol d, MOD A2C: 32.3 ml LV vol d, MOD A4C: 85.6 ml LV vol s, MOD A2C: 22.3 ml LV vol s, MOD A4C: 35.2 ml LV SV MOD A2C:     10.0 ml LV SV MOD A4C:     85.6 ml LV SV MOD BP:      26.6 ml RIGHT VENTRICLE RV Basal diam:  5.30 cm RV Mid diam:    4.50 cm RV S prime:     6.53 cm/s TAPSE (M-mode): 1.7 cm LEFT ATRIUM           Index        RIGHT ATRIUM           Index LA diam:      3.80 cm 2.05 cm/m   RA Area:     22.10 cm LA Vol (A4C): 31.8 ml 17.15 ml/m  RA Volume:   71.80 ml  38.73 ml/m  AORTIC VALVE                    PULMONIC VALVE AV Area (Vmax):    2.06 cm     PV Vmax:        1.02 m/s AV Area (Vmean):   1.99 cm     PV Vmean:       67.800 cm/s AV Area (VTI):     2.63 cm     PV VTI:         0.178 m AV Vmax:           122.00 cm/s  PV Peak grad:   4.2 mmHg AV Vmean:          81.100 cm/s  PV Mean grad:   2.0 mmHg AV VTI:            0.187 m      RVOT Peak grad: 2 mmHg AV Peak Grad:      6.0 mmHg AV Mean Grad:      3.0 mmHg LVOT Vmax:         72.60 cm/s LVOT Vmean:        46.700 cm/s LVOT VTI:  0.142 m LVOT/AV VTI ratio: 0.76  AORTA Ao Root diam: 3.10 cm Ao Asc diam:  3.40 cm MITRAL  VALVE               TRICUSPID VALVE MV Area (PHT): 3.17 cm    TR Peak grad:   24.2 mmHg MV Area VTI:   1.64 cm    TR Mean grad:   20.0 mmHg MV Peak grad:  4.1 mmHg    TR Vmax:        246.00 cm/s MV Mean grad:  2.0 mmHg    TR Vmean:       217.0 cm/s MV Vmax:       1.01 m/s MV Vmean:      71.8 cm/s   SHUNTS MV Decel Time: 239 msec    Systemic VTI:  0.14 m MV E velocity: 61.80 cm/s  Systemic Diam: 2.10 cm MV A velocity: 74.90 cm/s  Pulmonic VTI:  0.132 m MV E/A ratio:  0.83 Evalene Lunger MD Electronically signed by Evalene Lunger MD Signature Date/Time: 05/09/2024/6:36:50 PM    Final    CT ABDOMEN PELVIS W CONTRAST Result Date: 05/08/2024 CLINICAL DATA:  Sepsis. EXAM: CT ABDOMEN AND PELVIS WITH CONTRAST TECHNIQUE: Multidetector CT imaging of the abdomen and pelvis was performed using the standard protocol following bolus administration of intravenous contrast. RADIATION DOSE REDUCTION: This exam was performed according to the departmental dose-optimization program which includes automated exposure control, adjustment of the mA and/or kV according to patient size and/or use of iterative reconstruction technique. CONTRAST:  OMNIPAQUE  IOHEXOL  350 MG/ML SOLN COMPARISON:  CT abdomen pelvis dated 10/07/2020. FINDINGS: Lower chest: Bibasilar linear atelectasis/scarring. No intra-abdominal free air or free fluid. Hepatobiliary: The liver is unremarkable. No biliary dilatation. The gallbladder is unremarkable Pancreas: Unremarkable. No pancreatic ductal dilatation or surrounding inflammatory changes. Spleen: Normal in size without focal abnormality. Adrenals/Urinary Tract: The adrenal glands unremarkable. There is no hydronephrosis on either side. There is heterogeneous enhancement of the right renal parenchyma consistent with pyelonephritis. Correlation with urinalysis recommended. No abscess. The visualized ureters and urinary bladder appear unremarkable. Stomach/Bowel: There is sigmoid diverticulosis and scattered  colonic diverticula. There is no bowel obstruction or active inflammation. The appendix is normal. Vascular/Lymphatic: Moderate aortoiliac atherosclerotic disease. The IVC is unremarkable. No portal venous gas. There is no adenopathy. Reproductive: No suspicious adnexal masses. Other: Small fat containing supraumbilical hernia. Musculoskeletal: Osteopenia with degenerative changes of spine. No acute osseous pathology. IMPRESSION: 1. Right-sided pyelonephritis. No abscess. 2. Colonic diverticulosis. No bowel obstruction. Normal appendix. 3.  Aortic Atherosclerosis (ICD10-I70.0). Electronically Signed   By: Vanetta Chou M.D.   On: 05/08/2024 18:23   DG Chest Portable 1 View Result Date: 05/06/2024 CLINICAL DATA:  Sepsis. EXAM: PORTABLE CHEST 1 VIEW COMPARISON:  02/23/2022, CT 01/28/2024 FINDINGS: Prior median sternotomy. The heart is borderline enlarged. Aortic atherosclerosis. The lungs are hyperinflated with bronchial thickening. Questionable hazy opacity at the left lung base versus soft tissue attenuation. No pneumothorax. On limited assessment, no acute osseous findings. IMPRESSION: 1. Hyperinflation with bronchial thickening, suggesting COPD. 2. Questionable hazy opacity at the left lung base versus soft tissue attenuation. Recommend PA and lateral radiograph for further assessment when patient is able. Electronically Signed   By: Andrea Gasman M.D.   On: 05/06/2024 15:50   CT Head Wo Contrast Result Date: 05/06/2024 CLINICAL DATA:  Altered mental status. EXAM: CT HEAD WITHOUT CONTRAST TECHNIQUE: Contiguous axial images were obtained from the base of the skull through the vertex without  intravenous contrast. RADIATION DOSE REDUCTION: This exam was performed according to the departmental dose-optimization program which includes automated exposure control, adjustment of the mA and/or kV according to patient size and/or use of iterative reconstruction technique. COMPARISON:  May 09, 2020  FINDINGS: Brain: No evidence of acute infarction, hemorrhage, hydrocephalus, extra-axial collection or mass lesion/mass effect. Vascular: Marked severity bilateral cavernous carotid artery calcification is noted. Skull: Normal. Negative for fracture or focal lesion. Sinuses/Orbits: There is marked severity bilateral ethmoid sinus and mild left maxillary sinus mucosal thickening. Other: None. IMPRESSION: 1. No acute intracranial abnormality. 2. Marked severity bilateral ethmoid sinus and mild left maxillary sinus disease. Electronically Signed   By: Suzen Dials M.D.   On: 05/06/2024 15:11    Microbiology: Recent Results (from the past 240 hours)  Urine Culture     Status: Abnormal   Collection Time: 05/06/24  2:22 PM   Specimen: Urine, Catheterized  Result Value Ref Range Status   Specimen Description   Final    URINE, CATHETERIZED Performed at [facility] [place], [address] [address], [place], [place] [address]    Special Requests   Final    NONE Performed at [facility], [address] [place], [place] [address]    Culture >=100,000 COLONIES/mL ESCHERICHIA COLI (A)  Final   Report Status 05/08/2024 FINAL  Final   Organism ID, Bacteria ESCHERICHIA COLI (A)  Final      Susceptibility   Escherichia coli - MIC*    AMPICILLIN <=2 SENSITIVE Sensitive     CEFAZOLIN  (URINE) Value in next row Sensitive      <=1 SENSITIVEThis is a modified FDA-approved test that has been validated and its performance characteristics determined by the reporting laboratory.  This laboratory is certified under the Clinical Laboratory Improvement Amendments CLIA as qualified to perform high complexity clinical laboratory testing.    CEFEPIME  Value in next row Sensitive      <=1 SENSITIVEThis is a modified FDA-approved test that has been validated and its performance characteristics determined by the reporting laboratory.  This laboratory is certified under the Clinical Laboratory Improvement Amendments  CLIA as qualified to perform high complexity clinical laboratory testing.    ERTAPENEM Value in next row Sensitive      <=1 SENSITIVEThis is a modified FDA-approved test that has been validated and its performance characteristics determined by the reporting laboratory.  This laboratory is certified under the Clinical Laboratory Improvement Amendments CLIA as qualified to perform high complexity clinical laboratory testing.    CEFTRIAXONE  Value in next row Sensitive      <=1 SENSITIVEThis is a modified FDA-approved test that has been validated and its performance characteristics determined by the reporting laboratory.  This laboratory is certified under the Clinical Laboratory Improvement Amendments CLIA as qualified to perform high complexity clinical laboratory testing.    CIPROFLOXACIN  Value in next row Sensitive      <=1 SENSITIVEThis is a modified FDA-approved test that has been validated and its performance characteristics determined by the reporting laboratory.  This laboratory is certified under the Clinical Laboratory Improvement Amendments CLIA as qualified to perform high complexity clinical laboratory testing.    GENTAMICIN Value in next row Sensitive      <=1 SENSITIVEThis is a modified FDA-approved test that has been validated and its performance characteristics determined by the reporting laboratory.  This laboratory is certified under the Clinical Laboratory Improvement Amendments CLIA as qualified to perform high complexity clinical laboratory testing.    NITROFURANTOIN Value in next row  Sensitive      <=1 SENSITIVEThis is a modified FDA-approved test that has been validated and its performance characteristics determined by the reporting laboratory.  This laboratory is certified under the Clinical Laboratory Improvement Amendments CLIA as qualified to perform high complexity clinical laboratory testing.    TRIMETH/SULFA Value in next row Resistant      <=1 SENSITIVEThis is a modified  FDA-approved test that has been validated and its performance characteristics determined by the reporting laboratory.  This laboratory is certified under the Clinical Laboratory Improvement Amendments CLIA as qualified to perform high complexity clinical laboratory testing.    AMPICILLIN/SULBACTAM Value in next row Sensitive      <=1 SENSITIVEThis is a modified FDA-approved test that has been validated and its performance characteristics determined by the reporting laboratory.  This laboratory is certified under the Clinical Laboratory Improvement Amendments CLIA as qualified to perform high complexity clinical laboratory testing.    PIP/TAZO Value in next row Sensitive      <=4 SENSITIVEThis is a modified FDA-approved test that has been validated and its performance characteristics determined by the reporting laboratory.  This laboratory is certified under the Clinical Laboratory Improvement Amendments CLIA as qualified to perform high complexity clinical laboratory testing.    MEROPENEM Value in next row Sensitive      <=4 SENSITIVEThis is a modified FDA-approved test that has been validated and its performance characteristics determined by the reporting laboratory.  This laboratory is certified under the Clinical Laboratory Improvement Amendments CLIA as qualified to perform high complexity clinical laboratory testing.    * >=100,000 COLONIES/mL ESCHERICHIA COLI  Culture, blood (routine x 2)     Status: Abnormal   Collection Time: 05/06/24  2:23 PM   Specimen: BLOOD RIGHT HAND  Result Value Ref Range Status   Specimen Description   Final    BLOOD RIGHT HAND Performed at [facility], [address], [place], [place] [address]    Special Requests   Final    BOTTLES DRAWN AEROBIC AND ANAEROBIC Blood Culture results may not be optimal due to an inadequate volume of blood received in culture bottles Performed at [facility], [address] [place], [place] [address]    Culture   Setup Time   Final    GRAM NEGATIVE RODS IN BOTH AEROBIC AND ANAEROBIC BOTTLES CRITICAL VALUE NOTED.  VALUE IS CONSISTENT WITH PREVIOUSLY REPORTED AND CALLED VALUE. Performed at [facility], [address] [place], [place] [address]    Culture (A)  Final    ESCHERICHIA COLI SUSCEPTIBILITIES PERFORMED ON PREVIOUS CULTURE WITHIN THE LAST 5 DAYS. Performed at [facility], [address], [place], [place] [address]    Report Status 05/09/2024 FINAL  Final  Culture, blood (routine x 2)     Status: Abnormal   Collection Time: 05/06/24  2:23 PM   Specimen: BLOOD LEFT HAND  Result Value Ref Range Status   Specimen Description   Final    BLOOD LEFT HAND Performed at [facility], [address], [place], [place] [address]    Special Requests   Final    BOTTLES DRAWN AEROBIC AND ANAEROBIC Blood Culture adequate volume Performed at [facility], [address] [place], [place] [address]    Culture  Setup Time   Final    GRAM NEGATIVE RODS IN BOTH AEROBIC AND ANAEROBIC BOTTLES CRITICAL RESULT CALLED TO, READ BACK BY AND VERIFIED WITH: NATHAN BELUE PHARMD 0500 05/07/24 HNM  Culture (A)  Final    ESCHERICHIA COLI STAPHYLOCOCCUS EPIDERMIDIS THE SIGNIFICANCE OF ISOLATING THIS ORGANISM FROM A SINGLE SET OF BLOOD CULTURES WHEN MULTIPLE SETS ARE DRAWN IS UNCERTAIN. PLEASE NOTIFY THE MICROBIOLOGY DEPARTMENT WITHIN ONE WEEK IF SPECIATION AND SENSITIVITIES ARE REQUIRED. STAPHYLOCOCCUS AUREUS BY BCID UNABLE TO RECOVER FROM CULTURE Performed at [facility], [address] [place], [place] [address]    Report Status 05/14/2024 FINAL  Final   Organism ID, Bacteria ESCHERICHIA COLI  Final      Susceptibility   Escherichia coli - MIC*    AMPICILLIN <=2 SENSITIVE Sensitive     CEFAZOLIN  (NON-URINE) <=1 SENSITIVE Sensitive     CEFEPIME  <=0.12 SENSITIVE Sensitive     ERTAPENEM <=0.12 SENSITIVE Sensitive     CEFTRIAXONE  <=0.25 SENSITIVE Sensitive      CIPROFLOXACIN  <=0.06 SENSITIVE Sensitive     GENTAMICIN <=1 SENSITIVE Sensitive     MEROPENEM <=0.25 SENSITIVE Sensitive     TRIMETH/SULFA >=320 RESISTANT Resistant     AMPICILLIN/SULBACTAM <=2 SENSITIVE Sensitive     PIP/TAZO Value in next row Sensitive      <=4 SENSITIVEThis is a modified FDA-approved test that has been validated and its performance characteristics determined by the reporting laboratory.  This laboratory is certified under the Clinical Laboratory Improvement Amendments CLIA as qualified to perform high complexity clinical laboratory testing.    * ESCHERICHIA COLI  Resp panel by RT-PCR (RSV, Flu A&B, Covid) Anterior Nasal Swab     Status: Abnormal   Collection Time: 05/06/24  2:23 PM   Specimen: Anterior Nasal Swab  Result Value Ref Range Status   SARS Coronavirus 2 by RT PCR POSITIVE (A) NEGATIVE Final    Comment: (NOTE) SARS-CoV-2 target nucleic acids are DETECTED.  The SARS-CoV-2 RNA is generally detectable in upper respiratory specimens during the acute phase of infection. Positive results are indicative of the presence of the identified virus, but do not rule out bacterial infection or co-infection with other pathogens not detected by the test. Clinical correlation with patient history and other diagnostic information is necessary to determine patient infection status. The expected result is Negative.  Fact Sheet for Patients: BloggerCourse.com  Fact Sheet for Healthcare Providers: SeriousBroker.it  This test is not yet approved or cleared by the [place]  FDA and  has been authorized for detection and/or diagnosis of SARS-CoV-2 by FDA under an Emergency Use Authorization (EUA).  This EUA will remain in effect (meaning this test can be used) for the duration of  the COVID-19 declaration under Section 564(b)(1) of the A ct, 21 U.S.C. section 360bbb-3(b)(1), unless the authorization is terminated or revoked  sooner.     Influenza A by PCR NEGATIVE NEGATIVE Final   Influenza B by PCR NEGATIVE NEGATIVE Final    Comment: (NOTE) The Xpert Xpress SARS-CoV-2/FLU/RSV plus assay is intended as an aid in the diagnosis of influenza from Nasopharyngeal swab specimens and should not be used as a sole basis for treatment. Nasal washings and aspirates are unacceptable for Xpert Xpress SARS-CoV-2/FLU/RSV testing.  Fact Sheet for Patients: BloggerCourse.com  Fact Sheet for Healthcare Providers: SeriousBroker.it  This test is not yet approved or cleared by the [place]  FDA and has been authorized for detection and/or diagnosis of SARS-CoV-2 by FDA under an Emergency Use Authorization (EUA). This EUA will remain in effect (meaning this test can be used) for the duration of the COVID-19 declaration under Section 564(b)(1) of the Act, 21 U.S.C. section 360bbb-3(b)(1), unless the authorization is terminated  or revoked.     Resp Syncytial Virus by PCR NEGATIVE NEGATIVE Final    Comment: (NOTE) Fact Sheet for Patients: BloggerCourse.com  Fact Sheet for Healthcare Providers: SeriousBroker.it  This test is not yet approved or cleared by the [place]  FDA and has been authorized for detection and/or diagnosis of SARS-CoV-2 by FDA under an Emergency Use Authorization (EUA). This EUA will remain in effect (meaning this test can be used) for the duration of the COVID-19 declaration under Section 564(b)(1) of the Act, 21 U.S.C. section 360bbb-3(b)(1), unless the authorization is terminated or revoked.  Performed at [facility], [address] [place], [place] [address]   Blood Culture ID Panel (Reflexed)     Status: Abnormal   Collection Time: 05/06/24  2:23 PM  Result Value Ref Range Status   Enterococcus faecalis NOT DETECTED NOT DETECTED Final   Enterococcus Faecium NOT DETECTED  NOT DETECTED Final   Listeria monocytogenes NOT DETECTED NOT DETECTED Final   Staphylococcus species DETECTED (A) NOT DETECTED Final    Comment: CRITICAL RESULT CALLED TO, READ BACK BY AND VERIFIED WITH: NATHAN BELUE PHARMD 0500 05/07/24 HNM    Staphylococcus aureus (BCID) DETECTED (A) NOT DETECTED Final    Comment: CRITICAL RESULT CALLED TO, READ BACK BY AND VERIFIED WITH: NATHAN BELUE PHARMD 0500 05/07/24 HNM    Staphylococcus epidermidis NOT DETECTED NOT DETECTED Final   Staphylococcus lugdunensis NOT DETECTED NOT DETECTED Final   Streptococcus species NOT DETECTED NOT DETECTED Final   Streptococcus agalactiae NOT DETECTED NOT DETECTED Final   Streptococcus pneumoniae NOT DETECTED NOT DETECTED Final   Streptococcus pyogenes NOT DETECTED NOT DETECTED Final   A.calcoaceticus-baumannii NOT DETECTED NOT DETECTED Final   Bacteroides fragilis NOT DETECTED NOT DETECTED Final   Enterobacterales DETECTED (A) NOT DETECTED Final    Comment: Enterobacterales represent a large order of gram negative bacteria, not a single organism. CRITICAL RESULT CALLED TO, READ BACK BY AND VERIFIED WITH: NATHAN BELUE PHARMD 0500 05/07/24 HNM    Enterobacter cloacae complex NOT DETECTED NOT DETECTED Final   Escherichia coli DETECTED (A) NOT DETECTED Final    Comment: CRITICAL RESULT CALLED TO, READ BACK BY AND VERIFIED WITH: NATHAN BELUE PHARMD 0500 05/07/24 HNM    Klebsiella aerogenes NOT DETECTED NOT DETECTED Final   Klebsiella oxytoca NOT DETECTED NOT DETECTED Final   Klebsiella pneumoniae NOT DETECTED NOT DETECTED Final   Proteus species NOT DETECTED NOT DETECTED Final   Salmonella species NOT DETECTED NOT DETECTED Final   Serratia marcescens NOT DETECTED NOT DETECTED Final   Haemophilus influenzae NOT DETECTED NOT DETECTED Final   Neisseria meningitidis NOT DETECTED NOT DETECTED Final   Pseudomonas aeruginosa NOT DETECTED NOT DETECTED Final   Stenotrophomonas maltophilia NOT DETECTED NOT DETECTED Final    Candida albicans NOT DETECTED NOT DETECTED Final   Candida auris NOT DETECTED NOT DETECTED Final   Candida glabrata NOT DETECTED NOT DETECTED Final   Candida krusei NOT DETECTED NOT DETECTED Final   Candida parapsilosis NOT DETECTED NOT DETECTED Final   Candida tropicalis NOT DETECTED NOT DETECTED Final   Cryptococcus neoformans/gattii NOT DETECTED NOT DETECTED Final   CTX-M ESBL NOT DETECTED NOT DETECTED Final   Carbapenem resistance IMP NOT DETECTED NOT DETECTED Final   Carbapenem resistance KPC NOT DETECTED NOT DETECTED Final   Meth resistant mecA/C and MREJ NOT DETECTED NOT DETECTED Final   Carbapenem resistance NDM NOT DETECTED NOT DETECTED Final   Carbapenem resist OXA 48 LIKE NOT DETECTED NOT DETECTED Final  Carbapenem resistance VIM NOT DETECTED NOT DETECTED Final    Comment: Performed at [facility], [address] [place], [place] [address]  Aerobic Culture w Gram Stain (superficial specimen)     Status: None   Collection Time: 05/08/24  5:25 PM   Specimen: Wound  Result Value Ref Range Status   Specimen Description   Final    WOUND Performed at [facility], [address] [place], [place] [address]    Special Requests   Final    LEFT FOREARM Performed at [facility], [address] [place], [place] [address]    Gram Stain NO WBC SEEN NO ORGANISMS SEEN   Final   Culture   Final    RARE STAPHYLOCOCCUS EPIDERMIDIS RARE CORYNEBACTERIUM PSEUDODIPHTHERIAE Standardized susceptibility testing for this organism is not available. Performed at [facility], [address], [place], [place] [address]    Report Status 05/12/2024 FINAL  Final   Organism ID, Bacteria STAPHYLOCOCCUS EPIDERMIDIS  Final      Susceptibility   Staphylococcus epidermidis - MIC*    CIPROFLOXACIN  <=0.5 SENSITIVE Sensitive     ERYTHROMYCIN >=8 RESISTANT Resistant     GENTAMICIN <=0.5 SENSITIVE Sensitive     OXACILLIN <=0.25 SENSITIVE Sensitive      TETRACYCLINE >=16 RESISTANT Resistant     VANCOMYCIN  1 SENSITIVE Sensitive     TRIMETH/SULFA <=10 SENSITIVE Sensitive     CLINDAMYCIN <=0.25 SENSITIVE Sensitive     RIFAMPIN <=0.5 SENSITIVE Sensitive     Inducible Clindamycin NEGATIVE Sensitive     * RARE STAPHYLOCOCCUS EPIDERMIDIS  Culture, blood (Routine X 2) w Reflex to ID Panel     Status: None   Collection Time: 05/09/24  7:56 AM   Specimen: BLOOD  Result Value Ref Range Status   Specimen Description BLOOD BLOOD RIGHT FOREARM  Final   Special Requests   Final    BOTTLES DRAWN AEROBIC AND ANAEROBIC Blood Culture adequate volume   Culture   Final    NO GROWTH 5 DAYS Performed at [facility], [address] [place], [place] [address]    Report Status 05/14/2024 FINAL  Final  Culture, blood (Routine X 2) w Reflex to ID Panel     Status: None   Collection Time: 05/09/24  7:56 AM   Specimen: BLOOD  Result Value Ref Range Status   Specimen Description BLOOD BLOOD RIGHT HAND  Final   Special Requests   Final    BOTTLES DRAWN AEROBIC AND ANAEROBIC Blood Culture adequate volume   Culture   Final    NO GROWTH 5 DAYS Performed at [facility], [address] [place], [place] [address]    Report Status 05/14/2024 FINAL  Final     Labs: CBC: Recent Labs  Lab 05/08/24 0515 05/09/24 0756 05/11/24 0544 05/12/24 0524 05/13/24 0542 05/14/24 0616  WBC 11.7* 9.9 15.5* 16.7* 12.9* 10.6*  NEUTROABS 9.4*  --  11.4* 12.2* 9.0*  --   HGB 11.8* 11.6* 10.4* 10.6* 10.6* 10.5*  HCT 33.6* 34.2* 30.2* 31.2* 32.2* 31.6*  MCV 91.3 91.9 92.9 94.0 96.4 94.6  PLT 294 351 429* 463* 571* 645*   Basic Metabolic Panel: Recent Labs  Lab 05/09/24 0756 05/11/24 0544 05/12/24 0524 05/13/24 0542 05/14/24 0616  NA 138 136 135 136 137  K 3.6 3.5 3.4* 4.3 4.4  CL 101 103 100 102 101  CO2 21* 24 24 25 23   GLUCOSE 108* 132* 109* 101* 111*  BUN 8  7* 9 7* 9  CREATININE 0.65 0.69 0.62 0.62 0.65  CALCIUM  8.5* 8.2* 8.3* 8.6* 8.9   MG  --   --   --  2.1 2.3  PHOS  --   --   --  3.2 3.7   Liver Function Tests: No results for input(s): AST, ALT, ALKPHOS, BILITOT, PROT, ALBUMIN  in the last 168 hours. No results for input(s): LIPASE, AMYLASE in the last 168 hours. No results for input(s): AMMONIA in the last 168 hours. Cardiac Enzymes: No results for input(s): CKTOTAL, CKMB, CKMBINDEX, TROPONINI in the last 168 hours. BNP (last 3 results) No results for input(s): BNP in the last 8760 hours. CBG: Recent Labs  Lab 05/13/24 0823 05/13/24 1217 05/13/24 1613 05/13/24 2008 05/14/24 0816  GLUCAP 115* 108* 110* 177* 114*    Time spent: 35 minutes  Signed:  Elvan Sor  [facility] 05/14/2024 10:59 AM

## 2024-05-14 NOTE — Consult Note (Signed)
 PHARMACY CONSULT NOTE - ELECTROLYTES  Pharmacy Consult for Electrolyte Monitoring and Replacement   Recent Labs: Height: 5' 0.5 (153.7 cm) Weight: 88 kg (194 lb) IBW/kg (Calculated) : 46.65 Estimated Creatinine Clearance: 64.4 mL/min (by C-G formula based on SCr of 0.65 mg/dL). Potassium (mmol/L)  Date Value  05/14/2024 4.4  08/30/2012 3.9   Magnesium  (mg/dL)  Date Value  90/75/7974 2.3   Calcium  (mg/dL)  Date Value  90/75/7974 8.9   Calcium , Total (mg/dL)  Date Value  98/89/7985 8.4 (L)   Albumin  (g/dL)  Date Value  90/83/7974 3.5   Phosphorus (mg/dL)  Date Value  90/75/7974 3.7   Sodium (mmol/L)  Date Value  05/14/2024 137  08/26/2020 137  08/30/2012 134 (L)   Corrected Ca: 9.2 mg/dL  Assessment  [name] is a 73 y.o. female presenting with sepsis with acute organ failure. PMH significant for morbid obesity, neuropathy, hypothyroid, GERD, non-insulin -dependent diabetes mellitus type 2, hyperlipidemia, CAD, COPD . Pharmacy has been consulted to monitor and replace electrolytes.  Diet: heart healthy/carb modified diet Goal of Therapy: Electrolytes WNL  Plan:  No electrolyte replacement currently warranted Recheck BMP with AM labs tomorrow  Thank you for allowing pharmacy to be a part of this patient's care.  Will M. Lenon, PharmD, BCPS Clinical Pharmacist 05/14/2024 9:18 AM

## 2024-05-15 ENCOUNTER — Telehealth: Payer: Self-pay

## 2024-05-15 NOTE — Patient Instructions (Addendum)
 Visit Information  Thank you for taking time to visit with me today. Please don't hesitate to contact me if I can be of assistance to you   Patient instructions Notify your provider for any new/ ongoing symptoms Call and scheduled a hospital follow up visit with your primary care provider  Take your medications as prescribed.    Patient verbalizes understanding of instructions and care plan provided today and agrees to view in MyChart. Active MyChart status and patient understanding of how to access instructions and care plan via MyChart confirmed with patient.     The patient has been provided with contact information for the care management team and has been advised to call with any health related questions or concerns.   Please call the care guide team at [phone] if you need to cancel or reschedule your appointment.   Please call the Suicide and Crisis Lifeline: 988 call the [place]  National Suicide Prevention Lifeline: (403)308-8801 or TTY: 541-071-7457 TTY 267-199-8945) to talk to a trained counselor call 1-800-273-TALK (toll free, 24 hour hotline) if you are experiencing a Mental Health or Behavioral Health Crisis or need someone to talk to.  Arvin Seip RN, BSN, CCM [employer], Population Health Case Manager Phone: [phone]

## 2024-05-15 NOTE — Transitions of Care (Post Inpatient/ED Visit) (Signed)
 05/15/2024  Name: [name] MRN: [id number] DOB: 02-15-1951  Today's TOC FU Call Status: Today's TOC FU Call Status:: Successful TOC FU Call Completed TOC FU Call Complete Date: 05/15/24 Patient's Name and Date of Birth confirmed.  Transition Care Management Follow-up Telephone Call Date of Discharge: 05/14/24 Discharge Facility: [facility]) Type of Discharge: Inpatient Admission Primary Inpatient Discharge Diagnosis:: severe sepsis with acute organ dysfunction How have you been since you were released from the hospital?: Better Any questions or concerns?: No  Items Reviewed: Did you receive and understand the discharge instructions provided?: Yes Medications obtained,verified, and reconciled?: Yes (Medications Reviewed) Any new allergies since your discharge?: No Dietary orders reviewed?: Yes Type of Diet Ordered:: cardiac and carb modified diet Do you have support at home?: Yes People in Home [RPT]: spouse Name of Support/Comfort Primary Source: Lamar Pinch  Medications Reviewed Today: Medications Reviewed Today     Reviewed by Ziomara Birenbaum E, RN (Registered Nurse) on 05/15/24 at 1041  Med List Status: <None>   Medication Order Taking? Sig Documenting Provider Last Dose Status Informant  acetaminophen  (TYLENOL  8 HOUR ARTHRITIS PAIN) 650 MG CR tablet [id number] Yes Take 650 mg by mouth every 8 (eight) hours as needed for pain. [provider]  Active Self  albuterol  (PROVENTIL ) (2.5 MG/3ML) 0.083% nebulizer solution 2.5 mg [id number]   Tamea Dedra CROME, MD  Active   albuterol  (VENTOLIN  HFA) 108 (90 Base) MCG/ACT inhaler [id number] Yes Inhale 2 puffs into the lungs every 6 (six) hours as needed for wheezing or shortness of breath. Tamea Dedra CROME, MD  Active Self  amoxicillin  (AMOXIL ) 500 MG capsule [id number] Yes Take 2 capsules (1,000 mg total) by mouth 3 (three) times daily for 3 days. Von Bellis, MD  Active   aspirin  81 MG  EC tablet [id number]  Take 1 tablet (81 mg total) by mouth daily.  Patient not taking: Reported on 05/15/2024   Darliss Rogue, MD  Active Self  Blood Glucose Monitoring Suppl [facility] VERIO REFLECT) w/Device KIT [id number] Yes 1 KIT BY DOES NOT APPLY ROUTE ONCE FOR 1 DOSE. USE AS INSTRUCTED TO CHECK BLOOD SUGAR ONCE A MICHAE Rilla Baller, MD  Active Self  Boswellia-Glucosamine-Vit D (OSTEO BI-FLEX ONE PER DAY PO) [id number] Yes Take 1 tablet by mouth daily. [provider]  Active Self  Budeson-Glycopyrrol-Formoterol  (BREZTRI  AEROSPHERE) 160-9-4.8 MCG/ACT AERO [id number] Yes Inhale 2 puffs into the lungs in the morning and at bedtime. Tamea Dedra CROME, MD  Active Self  Calcium  Carb-Cholecalciferol (CALCIUM  + VITAMIN D3 PO) [id number]  Take by mouth daily.  Patient not taking: Reported on 05/15/2024   [provider]  Active Self  cyclobenzaprine  (FLEXERIL ) 10 MG tablet [id number] Yes TAKE 1/2 TO 1 TABLET BY MOUTH TWICE A DAY AS NEEDED FOR MUSCLE SPASMS AND MIGRAINE Rilla Baller, MD  Active Self  famotidine  (PEPCID ) 20 MG tablet [id number] Yes Take 20 mg by mouth every other day. Alternates with omeprazole  [provider]  Active Self  gabapentin  (NEURONTIN ) 300 MG capsule [id number] Yes Take 1 capsule (300 mg total) by mouth 2 (two) times daily. Rilla Baller, MD  Active Self  glucose blood [facility] ULTRA TEST) test strip [id number] Yes Use as instructed to check sugars twice daily E11.69 Rilla Baller, MD  Active Self  Iron , Ferrous Sulfate , 325 (65 Fe) MG TABS [id number] Yes Take 325 mg by mouth once a week.  Patient taking differently: Take 325 mg by mouth once a week. Taking twice a week  Rilla Baller, MD  Active Self  isosorbide  mononitrate (IMDUR ) 60 MG 24 hr tablet [id number] Yes TAKE 0.5 TABLETS (30 MG TOTAL) BY MOUTH 2 (TWO) TIMES DAILY. Darliss Rogue, MD  Active Self  Lancets [facility] CATHRYNE PLUS [place]) MISC [id number] Yes Use as  instructed to check blood sugar 2 times a day Rilla Baller, MD  Active Self  levothyroxine  (SYNTHROID ) 100 MCG tablet [id number] Yes Take 1 tablet (100 mcg total) by mouth daily before breakfast. Rilla Baller, MD  Active Self  loratadine  (CLARITIN ) 10 MG tablet [id number] Yes Take 1 tablet (10 mg total) by mouth daily. Rilla Baller, MD  Active Self  metFORMIN  (GLUCOPHAGE ) 500 MG tablet [id number] Yes Take 1 tablet (500 mg total) by mouth daily with breakfast. Rilla Baller, MD  Active Self  metoprolol  succinate (TOPROL -XL) 25 MG 24 hr tablet [id number] Yes Take 0.5 tablets (12.5 mg total) by mouth daily. Rilla Baller, MD  Active Self  omeprazole  (PRILOSEC) 20 MG capsule [id number] Yes Take 1 capsule (20 mg total) by mouth every other day. Rilla Baller, MD  Active Self  [facility] VERIO test strip [id number] Yes USE AS INSTRUCTED TO CHECK BLOOD SUGAR 2 TIMES A MICHAE Rilla Baller, MD  Active Self  OXYGEN  [id number] Yes Inhale 2 L into the lungs at bedtime. [provider]  Active Self  ranolazine  (RANEXA ) 500 MG 12 hr tablet [id number] Yes Take 1 tablet (500 mg total) by mouth 2 (two) times daily. Darliss Rogue, MD  Active Self           Med Note ARNETA, LARAY GORMAN Schaumann Apr 10, 2024  2:24 PM)    Rhubarb (ESTROVEN COMPLETE PO) [id number] Yes Take 1 tablet by mouth daily. [provider]  Active Self  Semaglutide , 2 MG/DOSE, (OZEMPIC , 2 MG/DOSE,) 8 MG/3ML SOPN [id number] Yes INJECT 2 MG AS DIRECTED ONCE A WEEK. Rilla Baller, MD  Active Self  simvastatin  (ZOCOR ) 40 MG tablet [id number] Yes Take 1 tablet (40 mg total) by mouth at bedtime. Rilla Baller, MD  Active Self  torsemide  (DEMADEX ) 20 MG tablet [id number] Yes Take 1.5 tablets (30 mg total) by mouth daily. Darliss Rogue, MD  Active Self  traMADol  (ULTRAM ) 50 MG tablet [id number] Yes TAKE 1 TABLET (50 MG TOTAL) BY MOUTH TWICE A DAY AS NEEDED FOR MODERATE PAIN Jimmy Charlie FERNS, MD  Active Self   Turmeric 500 MG CAPS [id number] Yes Take 1 capsule by mouth 2 (two) times daily as needed. Rilla Baller, MD  Active Self  vitamin B-12 (CYANOCOBALAMIN ) 500 MCG tablet [id number] Yes Take 1 tablet (500 mcg total) by mouth 2 (two) times a week.  Patient taking differently: Take 500 mcg by mouth 2 (two) times a week. Patient states she takes 1 tablet 1 time per week.   Rilla Baller, MD  Active Self  ziprasidone  (GEODON ) 40 MG capsule [id number] Yes Take 1 capsule (40 mg total) by mouth 2 (two) times daily with a meal. Rilla Baller, MD  Active Self            Home Care and Equipment/Supplies: Were Home Health Services Ordered?: No Any new equipment or medical supplies ordered?: No  Functional Questionnaire: Do you need assistance with bathing/showering or dressing?: No Do you need assistance with meal preparation?: No Do you need assistance with eating?: No Do you have difficulty maintaining continence: No Do you need assistance with getting out of bed/getting out of a chair/moving?: No Do you have difficulty managing or taking your medications?: No  Follow up appointments reviewed: PCP Follow-up appointment confirmed?: Yes Date of PCP follow-up appointment?: 05/21/24 Follow-up Provider: Dr. Anton Blas Specialist [facility] Follow-up appointment confirmed?: NA Do you need transportation to your follow-up appointment?: No Do you understand care options if your condition(s) worsen?: Yes-patient verbalized understanding  SDOH Interventions Today    Flowsheet Row Most Recent Value  SDOH Interventions   Food Insecurity Interventions Intervention Not Indicated  Housing Interventions Intervention Not Indicated  Transportation Interventions Intervention Not Indicated  Utilities Interventions Intervention Not Indicated   Discussed and offered 30 day TOC program.  Patient declined.  The patient has been provided with contact information for the care management team and  has been advised to call with any health -related questions or concerns.  The patient verbalized understanding with current plan of care.  The patient is directed to their insurance card regarding availability of benefits coverage.    Arvin Seip RN, BSN, CCM [employer], Population Health Case Manager Phone: [phone]

## 2024-05-21 ENCOUNTER — Encounter: Payer: Self-pay | Admitting: Family Medicine

## 2024-05-21 ENCOUNTER — Ambulatory Visit: Admitting: Family Medicine

## 2024-05-21 VITALS — BP 124/68 | HR 90 | Temp 98.3°F | Ht 60.5 in | Wt 195.1 lb

## 2024-05-21 DIAGNOSIS — M5090 Cervical disc disorder, unspecified, unspecified cervical region: Secondary | ICD-10-CM

## 2024-05-21 DIAGNOSIS — Z7985 Long-term (current) use of injectable non-insulin antidiabetic drugs: Secondary | ICD-10-CM

## 2024-05-21 DIAGNOSIS — U071 COVID-19: Secondary | ICD-10-CM | POA: Diagnosis not present

## 2024-05-21 DIAGNOSIS — B9620 Unspecified Escherichia coli [E. coli] as the cause of diseases classified elsewhere: Secondary | ICD-10-CM | POA: Diagnosis not present

## 2024-05-21 DIAGNOSIS — Z87440 Personal history of urinary (tract) infections: Secondary | ICD-10-CM

## 2024-05-21 DIAGNOSIS — A419 Sepsis, unspecified organism: Secondary | ICD-10-CM

## 2024-05-21 DIAGNOSIS — E119 Type 2 diabetes mellitus without complications: Secondary | ICD-10-CM

## 2024-05-21 DIAGNOSIS — Z7984 Long term (current) use of oral hypoglycemic drugs: Secondary | ICD-10-CM

## 2024-05-21 DIAGNOSIS — N12 Tubulo-interstitial nephritis, not specified as acute or chronic: Secondary | ICD-10-CM

## 2024-05-21 DIAGNOSIS — Z8619 Personal history of other infectious and parasitic diseases: Secondary | ICD-10-CM

## 2024-05-21 DIAGNOSIS — E1169 Type 2 diabetes mellitus with other specified complication: Secondary | ICD-10-CM

## 2024-05-21 DIAGNOSIS — M797 Fibromyalgia: Secondary | ICD-10-CM | POA: Diagnosis not present

## 2024-05-21 DIAGNOSIS — M542 Cervicalgia: Secondary | ICD-10-CM

## 2024-05-21 MED ORDER — CYCLOBENZAPRINE HCL 10 MG PO TABS: ORAL_TABLET | ORAL | 3 refills | Status: AC

## 2024-05-21 MED ORDER — TRAMADOL HCL 50 MG PO TABS: ORAL_TABLET | ORAL | 0 refills | Status: DC

## 2024-05-21 NOTE — Patient Instructions (Addendum)
 Ok to cancel Monday's diabetes follow up appointment.  Labs today  I will refer you to physical medicine and rehab spine doctor in [place] for ongoing headache. In the meantime, heating pad to neck and use cyclobenzaprine  muscle relaxant as needed.  Return in 3 months for follow up visit
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
## 2024-05-22 LAB — CBC WITH DIFFERENTIAL/PLATELET
Basophils Absolute: 0.2 K/uL — ABNORMAL HIGH (ref 0.0–0.1)
Basophils Relative: 1.9 % (ref 0.0–3.0)
Eosinophils Absolute: 0.2 K/uL (ref 0.0–0.7)
Eosinophils Relative: 2.3 % (ref 0.0–5.0)
HCT: 35.1 % — ABNORMAL LOW (ref 36.0–46.0)
Hemoglobin: 11.9 g/dL — ABNORMAL LOW (ref 12.0–15.0)
Lymphocytes Relative: 16.2 % (ref 12.0–46.0)
Lymphs Abs: 1.7 K/uL (ref 0.7–4.0)
MCHC: 33.9 g/dL (ref 30.0–36.0)
MCV: 94.8 fl (ref 78.0–100.0)
Monocytes Absolute: 0.8 K/uL (ref 0.1–1.0)
Monocytes Relative: 7.1 % (ref 3.0–12.0)
Neutro Abs: 7.8 K/uL — ABNORMAL HIGH (ref 1.4–7.7)
Neutrophils Relative %: 72.5 % (ref 43.0–77.0)
Platelets: 598 K/uL — ABNORMAL HIGH (ref 150.0–400.0)
RBC: 3.7 Mil/uL — ABNORMAL LOW (ref 3.87–5.11)
RDW: 13.2 % (ref 11.5–15.5)
WBC: 10.7 K/uL — ABNORMAL HIGH (ref 4.0–10.5)

## 2024-05-22 LAB — HEMOGLOBIN A1C: Hgb A1c MFr Bld: 6.1 % (ref 4.6–6.5)

## 2024-05-26 ENCOUNTER — Ambulatory Visit: Admitting: Family Medicine

## 2024-05-26 ENCOUNTER — Encounter: Payer: Self-pay | Admitting: Family Medicine

## 2024-05-26 ENCOUNTER — Ambulatory Visit: Payer: Self-pay | Admitting: Family Medicine

## 2024-05-28 NOTE — Assessment & Plan Note (Addendum)
 With associated occipital headache, suspect component of occipital neuralgia given severe positional pain reproducible with palpation at occipital region  Will refer back to PM&R for further evaluation.  Tramadol  and flexeril  refilled in interim.

## 2024-05-28 NOTE — Assessment & Plan Note (Signed)
 Recurrent and worsening neck pain in known mild cervical DDD - prominent facet degeneration R C2-4 and L C4/5 on prior contrasted CT neck 2021  Recent cervical neck MRI overall stable as per above.  Last saw [facility] PMR 2021 - will refer back to PMR for further eval/management.

## 2024-05-28 NOTE — Assessment & Plan Note (Signed)
 This resolved on repeat imaging prior to discharge.  She has completed 10d antibiotic course.

## 2024-05-28 NOTE — Assessment & Plan Note (Signed)
 Resolved

## 2024-05-28 NOTE — Assessment & Plan Note (Signed)
 Asymptomatic from respiratory standpoint.

## 2024-05-28 NOTE — Assessment & Plan Note (Signed)
 Completed high dose augmentin  1gm 10d course, overall significantly improved.  Sepsis and AMS has resolved.

## 2024-05-28 NOTE — Assessment & Plan Note (Addendum)
Contributes to chronic pain.  

## 2024-05-28 NOTE — Assessment & Plan Note (Signed)
 Great control with A1c 6.1% - continue metformin  500mg  daily and ozempic  2mg  weekly.

## 2024-06-12 ENCOUNTER — Other Ambulatory Visit: Payer: Self-pay | Admitting: Cardiology

## 2024-06-26 DIAGNOSIS — M47812 Spondylosis without myelopathy or radiculopathy, cervical region: Secondary | ICD-10-CM | POA: Diagnosis not present

## 2024-07-04 ENCOUNTER — Other Ambulatory Visit: Payer: Self-pay | Admitting: Cardiology

## 2024-07-04 ENCOUNTER — Telehealth: Payer: Self-pay | Admitting: Family Medicine

## 2024-07-04 ENCOUNTER — Other Ambulatory Visit: Payer: Self-pay | Admitting: Family Medicine

## 2024-07-04 ENCOUNTER — Encounter: Payer: Self-pay | Admitting: Pharmacist

## 2024-07-04 DIAGNOSIS — M797 Fibromyalgia: Secondary | ICD-10-CM

## 2024-07-04 MED ORDER — SEMAGLUTIDE (1 MG/DOSE) 4 MG/3ML ~~LOC~~ SOPN: 1.0000 mg | PEN_INJECTOR | SUBCUTANEOUS | 0 refills | Status: DC

## 2024-07-04 NOTE — Telephone Encounter (Signed)
 Spoke with pt and verified the information taken by the [employer] rep. The pt has been without Ozempic  for over 1 month. I have checked the refrigerator here in our office and we do not have any medication with the pt's name on it. I was instructed by my team lead to send a message to the pharmcy team to follow up on the pt's patient assistance status.  Pt requested in the mean time that a prescription be sent to her local pharmacy ([facility]) until things are cleared up. She has not had any medication in over 1 month. Can a prescription be sent in for 2mg  or does she need a different dose?

## 2024-07-04 NOTE — Telephone Encounter (Signed)
 Copied from CRM #[id number]. Topic: Clinical - Prescription Issue >> Jul 04, 2024  2:31 PM Amy B wrote: Reason for CRM: Patient states she has not had a prescription of Semaglutide , 2 MG/DOSE, (OZEMPIC , 2 MG/DOSE,) 8 MG/3ML SOPN for over a month.  She gets it for free and picks it up at the clinic.  Please advise.

## 2024-07-04 NOTE — Telephone Encounter (Addendum)
 Recommend lower dose start at 1 mg weekly sent to pharmacy.   Question for [facility] pharmacist - is there any better PAP option available for mounjaro in the new year?

## 2024-07-04 NOTE — Addendum Note (Signed)
 Addended by: RILLA BALLER on: 07/04/2024 04:57 PM   Modules accepted: Orders

## 2024-07-04 NOTE — Progress Notes (Signed)
 Patient Assistance Program (PAP) Application   Refill form completed and uploaded to PCP eFax for review/signature Once signed, CMA may fax to [facility]. Fax# on application  May be eligible for 1 last box of Ozempic  though the program will no longer offer Ozempic  starting Jan 2026. Patient will have to receive through insurance, or consider alternative therapies.

## 2024-07-07 ENCOUNTER — Other Ambulatory Visit: Payer: Self-pay

## 2024-07-07 DIAGNOSIS — J449 Chronic obstructive pulmonary disease, unspecified: Secondary | ICD-10-CM | POA: Diagnosis not present

## 2024-07-07 MED ORDER — BREZTRI AEROSPHERE 160-9-4.8 MCG/ACT IN AERO: 2.0000 | INHALATION_SPRAY | Freq: Two times a day (BID) | RESPIRATORY_TRACT | 3 refills | Status: AC

## 2024-07-07 NOTE — Progress Notes (Signed)
Signed and in CMA box.  

## 2024-07-09 DIAGNOSIS — M47812 Spondylosis without myelopathy or radiculopathy, cervical region: Secondary | ICD-10-CM | POA: Diagnosis not present

## 2024-07-09 NOTE — Telephone Encounter (Signed)
 Patient states she cannot afford the medication, even at a lower dose.

## 2024-07-14 ENCOUNTER — Other Ambulatory Visit (HOSPITAL_COMMUNITY): Payer: Self-pay

## 2024-07-28 ENCOUNTER — Other Ambulatory Visit: Payer: Self-pay | Admitting: Family Medicine

## 2024-07-28 DIAGNOSIS — Z1231 Encounter for screening mammogram for malignant neoplasm of breast: Secondary | ICD-10-CM

## 2024-07-30 DIAGNOSIS — M47812 Spondylosis without myelopathy or radiculopathy, cervical region: Secondary | ICD-10-CM | POA: Diagnosis not present

## 2024-08-05 ENCOUNTER — Encounter: Payer: Self-pay | Admitting: Family Medicine

## 2024-08-06 DIAGNOSIS — J449 Chronic obstructive pulmonary disease, unspecified: Secondary | ICD-10-CM | POA: Diagnosis not present

## 2024-08-22 ENCOUNTER — Telehealth: Payer: Self-pay | Admitting: Family Medicine

## 2024-08-22 ENCOUNTER — Other Ambulatory Visit: Payer: Self-pay | Admitting: Pharmacist

## 2024-08-22 DIAGNOSIS — E1169 Type 2 diabetes mellitus with other specified complication: Secondary | ICD-10-CM

## 2024-08-22 MED ORDER — BLOOD GLUCOSE MONITORING SUPPL DEVI: 1.0000 | 0 refills | Status: AC

## 2024-08-22 MED ORDER — BLOOD GLUCOSE TEST VI STRP: 1.0000 | ORAL_STRIP | 0 refills | Status: DC

## 2024-08-22 MED ORDER — TRULICITY 0.75 MG/0.5ML ~~LOC~~ SOAJ: 0.7500 mg | SUBCUTANEOUS | 0 refills | Status: DC

## 2024-08-22 MED ORDER — LANCETS MISC: 1.0000 | 0 refills | Status: AC

## 2024-08-22 MED ORDER — LANCET DEVICE MISC: 1.0000 | 0 refills | Status: AC

## 2024-08-22 MED ORDER — TRULICITY 1.5 MG/0.5ML ~~LOC~~ SOAJ: 1.5000 mg | SUBCUTANEOUS | 3 refills | Status: DC

## 2024-08-22 NOTE — Addendum Note (Signed)
 Addended by: RILLA BALLER on: 08/22/2024 06:55 AM   Modules accepted: Orders

## 2024-08-22 NOTE — Telephone Encounter (Signed)
 Also received notice of need for new Rx for new preferred glucometer brand Trividia True or Accuchek. New Rx sent to pharmacy.

## 2024-08-22 NOTE — Telephone Encounter (Signed)
 Pt seen at husband's appt this week. Newly prescribed Trulicity in place of ozempic  -  can we see if she qualifies for PAP? I believe she is already established with pharmacy, but let me know if I need to place new referral. Thanks!

## 2024-08-22 NOTE — Telephone Encounter (Signed)
 Copied from CRM [id number]. Topic: Clinical - Prescription Issue >> Aug 22, 2024  4:22 PM Nessti S wrote: Reason for CRM: pt called because she picked up meds from pharm but she stated she needs the accucheck guide meter. Please call back soon as possible.

## 2024-08-22 NOTE — Progress Notes (Signed)
 Patient Assistance Program (PAP) Application   Manufacturer: Secretary/administrator    (New enrollment) Medication(s): Trulicity   Patient Portion of Application:  08/22/24: Completed with patient via online enrollment tool. Submitted. Uploaded to Media Tab. [id number] Income Documentation: N/A - Electronic verification elected.  Provider Portion of Application:  08/22/24: Provider portion completed by PharmD and uploaded PCP eFax folder for signature. Clinical Pool/CMA notified.  Prescription(s): Electronic Rx sent to [facility])    Next Steps: [x]    Provider portion of application filled out and uploaded to [facility] eFax folder for review/signature []    Upon PCP signature, Application to be faxed to [facility] PAP team AND scanned to chart: [facility] PAP Team: CPhT Patient Advocate Team Fax: [phone]  Note routed to PCP Clinic Pool to ensure PCP signature is obtained and application is faxed. Med Advocate PAP Spreadsheet updated   *[facility] clinic team - Please Addend/update this note as the Next Steps are completed in office*

## 2024-08-23 MED ORDER — TRULICITY 1.5 MG/0.5ML ~~LOC~~ SOAJ: 1.5000 mg | SUBCUTANEOUS | 3 refills | Status: AC

## 2024-08-23 MED ORDER — TRULICITY 0.75 MG/0.5ML ~~LOC~~ SOAJ: 0.7500 mg | SUBCUTANEOUS | 0 refills | Status: DC

## 2024-08-23 NOTE — Progress Notes (Signed)
 ERx

## 2024-08-25 NOTE — Progress Notes (Signed)
"  Faxed  "

## 2024-08-25 NOTE — Progress Notes (Signed)
 Signed and placed in CMA box.

## 2024-08-26 ENCOUNTER — Encounter: Payer: Self-pay | Admitting: Family Medicine

## 2024-08-26 ENCOUNTER — Ambulatory Visit (INDEPENDENT_AMBULATORY_CARE_PROVIDER_SITE_OTHER): Admitting: Family Medicine

## 2024-08-26 VITALS — BP 120/64 | HR 73 | Temp 97.5°F | Ht 60.5 in | Wt 207.0 lb

## 2024-08-26 DIAGNOSIS — Z7984 Long term (current) use of oral hypoglycemic drugs: Secondary | ICD-10-CM | POA: Diagnosis not present

## 2024-08-26 DIAGNOSIS — E1169 Type 2 diabetes mellitus with other specified complication: Secondary | ICD-10-CM

## 2024-08-26 DIAGNOSIS — Z6839 Body mass index (BMI) 39.0-39.9, adult: Secondary | ICD-10-CM | POA: Diagnosis not present

## 2024-08-26 DIAGNOSIS — M5090 Cervical disc disorder, unspecified, unspecified cervical region: Secondary | ICD-10-CM | POA: Diagnosis not present

## 2024-08-26 DIAGNOSIS — L989 Disorder of the skin and subcutaneous tissue, unspecified: Secondary | ICD-10-CM

## 2024-08-26 LAB — POCT GLYCOSYLATED HEMOGLOBIN (HGB A1C): Hemoglobin A1C: 6 % — AB (ref 4.0–5.6)

## 2024-08-26 MED ORDER — METFORMIN HCL 500 MG PO TABS: 500.0000 mg | ORAL_TABLET | Freq: Two times a day (BID) | ORAL | 3 refills | Status: AC

## 2024-08-26 MED ORDER — MUPIROCIN 2 % EX OINT: TOPICAL_OINTMENT | Freq: Two times a day (BID) | CUTANEOUS | 1 refills | Status: AC

## 2024-08-26 MED ORDER — TRAMADOL HCL 50 MG PO TABS: ORAL_TABLET | ORAL | 0 refills | Status: AC

## 2024-08-26 NOTE — Assessment & Plan Note (Signed)
 Discussed weight gain noted.  Encourage healthy diet and lifestyle choices to effect sustainable weight loss.  Start trulicity  as per above Obesity complicated by diabetes, HTN, HLD and OA

## 2024-08-26 NOTE — Patient Instructions (Addendum)
 Apply mupirocin  ointment sent to pharmacy twice daily for next 1-2 weeks to left forearm sores. Let us  know if not improving with this. Increase metformin  to 500mg  twice daily, but if sugars start dropping, decrease to once daily.  We are working towards switching from ozempic  to trulicity  (patient assistance program may be available)  Good to see you today Return in 3 months for  physical/wellness visit

## 2024-08-26 NOTE — Assessment & Plan Note (Signed)
 Appreciate PM&R care discussing nerve ablations in 2 wks.

## 2024-08-26 NOTE — Progress Notes (Addendum)
 " Ph: [phone] Fax: [phone]   Patient ID: [name], female    DOB: [birth year], 74 y.o.   MRN: [id number]  This visit was conducted in person.  BP 120/64 (Cuff Size: Normal)   Pulse 73   Temp (!) 97.5 F (36.4 C) (Oral)   Ht 5' 0.5 (1.537 m)   Wt 207 lb (93.9 kg)   SpO2 96%   BMI 39.76 kg/m    CC: 3 mo f/u visit  Subjective:   HPI: [name] is a 74 y.o. female presenting on 08/26/2024 for Medical Management of Chronic Issues (Pt states she's doing better from ecoli//Right knee is bother her a lot, abnormal gait, states she can't get knee surgery bc she takes care of husband)   Cooking fried chicken and suffered burns to left upper extremity. Upper arm healed well, but notes persistent open sores to left upper extremity forearm and distal. Initially treated with triple abx, neosporin without full healing. No known h/o MRSA. No drainage from lesions.   She has been receiving steroid injections through [facility] Dr Avanell, planning occipital nerve and C4/5 medial branch nerve ablations to neck 09/09/2024 after completing 3 occipital nerve and cervical C3/4 medial branch blocks successfully.   DM - does regularly check sugars fasting 131 this morning, ranging 117-161 over the past 2 weeks, night time 116-149. Compliant with antihyperglycemic regimen which includes: metformin  500mg  daily, ozempic  2mg  weekly (ran out 2 months ago with 12 lb weight gain) - transitioning to Trulicity  due to PAP eligibility in 2026. Denies low sugars or hypoglycemic symptoms. Denies paresthesias, blurry vision. Last diabetic eye exam 01/2024. Glucometer brand: accuchek guide. Last foot exam: 2024 - DUE. DSME: 2012. Lab Results  Component Value Date   HGBA1C 6.0 (A) 08/26/2024   Diabetic Foot Exam - Simple   Simple Foot Form Diabetic Foot exam was performed with the following findings: Yes 08/26/2024 10:37 AM  Visual Inspection No deformities, no ulcerations, no other skin  breakdown bilaterally: Yes Sensation Testing Intact to touch and monofilament testing bilaterally: Yes Pulse Check Posterior Tibialis and Dorsalis pulse intact bilaterally: Yes Comments No claudication    Lab Results  Component Value Date   MICROALBUR <0.7 11/16/2023        Relevant past medical, surgical, family and social history reviewed and updated as indicated. Interim medical history since our last visit reviewed. Allergies and medications reviewed and updated. Outpatient Medications Prior to Visit  Medication Sig Dispense Refill   acetaminophen  (TYLENOL  8 HOUR ARTHRITIS PAIN) 650 MG CR tablet Take 650 mg by mouth every 8 (eight) hours as needed for pain.     albuterol  (VENTOLIN  HFA) 108 (90 Base) MCG/ACT inhaler Inhale 2 puffs into the lungs every 6 (six) hours as needed for wheezing or shortness of breath. 18 g 3   aspirin  81 MG EC tablet Take 1 tablet (81 mg total) by mouth daily.     Blood Glucose Monitoring Suppl DEVI 1 each by Does not apply route as directed. Dispense based on patient and insurance preference. Use up to four times daily as directed. (FOR ICD-10 E10.9, E11.9). 1 each 0   Boswellia-Glucosamine-Vit D (OSTEO BI-FLEX ONE PER DAY PO) Take 1 tablet by mouth daily.     budesonide -glycopyrrolate -formoterol  (BREZTRI  AEROSPHERE) 160-9-4.8 MCG/ACT AERO inhaler Inhale 2 puffs into the lungs in the morning and at bedtime. 3 each 3   Calcium  Carb-Cholecalciferol (CALCIUM  + VITAMIN D3 PO) Take by mouth daily.  cyclobenzaprine  (FLEXERIL ) 10 MG tablet TAKE 1/2 TO 1 TABLET BY MOUTH TWICE A DAY AS NEEDED FOR MUSCLE SPASMS AND MIGRAINE 30 tablet 3   famotidine  (PEPCID ) 20 MG tablet Take 20 mg by mouth every other day. Alternates with omeprazole      gabapentin  (NEURONTIN ) 300 MG capsule TAKE 1 CAPSULE TWICE DAILY 180 capsule 1   Glucose Blood (BLOOD GLUCOSE TEST STRIPS) STRP 1 each by Does not apply route as directed. Dispense based on patient and insurance preference. Use up  to four times daily as directed. (FOR ICD-10 E10.9, E11.9). 100 strip 0   Iron , Ferrous Sulfate , 325 (65 Fe) MG TABS Take 325 mg by mouth once a week. (Patient taking differently: Take 325 mg by mouth once a week. Taking twice a week)     isosorbide  mononitrate (IMDUR ) 60 MG 24 hr tablet TAKE 0.5 TABLETS (30 MG TOTAL) BY MOUTH 2 (TWO) TIMES DAILY. 90 tablet 3   Lancet Device MISC 1 each by Does not apply route as directed. Dispense based on patient and insurance preference. Use up to four times daily as directed. (FOR ICD-10 E10.9, E11.9). 1 each 0   Lancets MISC 1 each by Does not apply route as directed. Dispense based on patient and insurance preference. Use up to four times daily as directed. (FOR ICD-10 E10.9, E11.9). 100 each 0   levothyroxine  (SYNTHROID ) 100 MCG tablet Take 1 tablet (100 mcg total) by mouth daily before breakfast. 90 tablet 4   loratadine  (CLARITIN ) 10 MG tablet Take 1 tablet (10 mg total) by mouth daily.     metoprolol  succinate (TOPROL -XL) 25 MG 24 hr tablet Take 0.5 tablets (12.5 mg total) by mouth daily. 45 tablet 4   omeprazole  (PRILOSEC) 20 MG capsule Take 1 capsule (20 mg total) by mouth every other day. 45 capsule 4   OXYGEN  Inhale 2 L into the lungs at bedtime.     ranolazine  (RANEXA ) 500 MG 12 hr tablet TAKE 1 TABLET TWICE A DAY 180 tablet 1   Rhubarb (ESTROVEN COMPLETE PO) Take 1 tablet by mouth daily.     simvastatin  (ZOCOR ) 40 MG tablet Take 1 tablet (40 mg total) by mouth at bedtime. 90 tablet 4   torsemide  (DEMADEX ) 20 MG tablet TAKE 0.5 TABLETS (10 MG TOTAL) BY MOUTH AS DIRECTED. TAKE ONE HALF TABLET (10 MG) ONCE A DAY AND MAY TAKE ADDITIONAL ONE HALF TABLET (10 MG) AS NEEDED FOR WEIGHT GAIN OR SHORTNESS OF BREATH. 90 tablet 2   Turmeric 500 MG CAPS Take 1 capsule by mouth 2 (two) times daily as needed.     vitamin B-12 (CYANOCOBALAMIN ) 500 MCG tablet Take 1 tablet (500 mcg total) by mouth 2 (two) times a week. (Patient taking differently: Take 500 mcg by mouth  2 (two) times a week. Patient states she takes 1 tablet 1 time per week.)     ziprasidone  (GEODON ) 40 MG capsule Take 1 capsule (40 mg total) by mouth 2 (two) times daily with a meal. 180 capsule 4   metFORMIN  (GLUCOPHAGE ) 500 MG tablet Take 1 tablet (500 mg total) by mouth daily with breakfast. 90 tablet 4   traMADol  (ULTRAM ) 50 MG tablet TAKE 1 TABLET (50 MG TOTAL) BY MOUTH TWICE A DAY AS NEEDED FOR MODERATE PAIN 30 tablet 0   Dulaglutide  (TRULICITY ) 0.75 MG/0.5ML SOAJ Inject 0.75 mg into the skin once a week. X 4 weeks then increase to 1.5 mg weekly (Patient not taking: Reported on 08/26/2024) 2 mL 0   [  START ON 09/19/2024] Dulaglutide  (TRULICITY ) 1.5 MG/0.5ML SOAJ Inject 1.5 mg into the skin once a week. (Patient not taking: Reported on 08/26/2024) 2 mL 3   Facility-Administered Medications Prior to Visit  Medication Dose Route Frequency Provider Last Rate Last Admin   albuterol  (PROVENTIL ) (2.5 MG/3ML) 0.083% nebulizer solution 2.5 mg  2.5 mg Nebulization Once Tamea Dedra CROME, MD         Per HPI unless specifically indicated in ROS section below Review of Systems  Objective:  BP 120/64 (Cuff Size: Normal)   Pulse 73   Temp (!) 97.5 F (36.4 C) (Oral)   Ht 5' 0.5 (1.537 m)   Wt 207 lb (93.9 kg)   SpO2 96%   BMI 39.76 kg/m   Wt Readings from Last 3 Encounters:  08/26/24 207 lb (93.9 kg)  05/21/24 195 lb 2 oz (88.5 kg)  05/15/24 197 lb 14.4 oz (89.8 kg)      Physical Exam Vitals and nursing note reviewed.  Constitutional:      Appearance: Normal appearance. She is not ill-appearing.  HENT:     Head: Normocephalic and atraumatic.     Mouth/Throat:     Mouth: Mucous membranes are moist.     Pharynx: Oropharynx is clear. No oropharyngeal exudate or posterior oropharyngeal erythema.  Eyes:     Extraocular Movements: Extraocular movements intact.     Conjunctiva/sclera: Conjunctivae normal.     Pupils: Pupils are equal, round, and reactive to light.  Cardiovascular:     Rate  and Rhythm: Normal rate and regular rhythm.     Pulses: Normal pulses.     Heart sounds: Normal heart sounds. No murmur heard. Pulmonary:     Effort: Pulmonary effort is normal. No respiratory distress.     Breath sounds: Normal breath sounds. No wheezing, rhonchi or rales.  Musculoskeletal:     Right lower leg: No edema.     Left lower leg: No edema.  Skin:    General: Skin is warm and dry.     Findings: No rash.  Neurological:     Mental Status: She is alert.  Psychiatric:        Mood and Affect: Mood normal.        Behavior: Behavior normal.       Results for orders placed or performed in visit on 08/26/24  HgB A1c   Collection Time: 08/26/24 10:32 AM  Result Value Ref Range   Hemoglobin A1C 6.0 (A) 4.0 - 5.6 %   HbA1c POC (<> result, manual entry)     HbA1c, POC (prediabetic range)     HbA1c, POC (controlled diabetic range)     Lab Results  Component Value Date   NA 137 05/14/2024   CL 101 05/14/2024   K 4.4 05/14/2024   CO2 23 05/14/2024   BUN 9 05/14/2024   CREATININE 0.65 05/14/2024   GFRNONAA >60 05/14/2024   CALCIUM  8.9 05/14/2024   PHOS 3.7 05/14/2024   ALBUMIN  3.5 05/06/2024   GLUCOSE 111 (H) 05/14/2024   Assessment & Plan:   Problem List Items Addressed This Visit     Type 2 diabetes mellitus with other specified complication (HCC) - Primary   Elevated fasting readings recently, however remains well controlled based on A1c despite running out of ozempic .  Increase metformin  to 500mg  BID.  Will work towards Trulicity  commencement (PAP available in 2026).  Reviewed dropping metformin  back to once daily if any low sugars  RTC 3 mo DM f/u visit  Diabetes associated with HTN, HLD, obesity, osteoarthritis.       Relevant Medications   metFORMIN  (GLUCOPHAGE ) 500 MG tablet   Other Relevant Orders   HgB A1c (Completed)   Severe obesity (BMI 35.0-39.9) with comorbidity (HCC)   Discussed weight gain noted.  Encourage healthy diet and lifestyle choices to  effect sustainable weight loss.  Start trulicity  as per above Obesity complicated by diabetes, HTN, HLD and OA       Relevant Medications   metFORMIN  (GLUCOPHAGE ) 500 MG tablet   Cervical neck pain with evidence of disc disease   Appreciate PM&R care discussing nerve ablations in 2 wks.       Skin sore   Skin sores to LUE in setting of recent skin burn with hot grease while frying chicken  Failed OTC topical antibiotic cream Rx topical mupirocin  to cover MRSA. Update if not improved with this.         Meds ordered this encounter  Medications   traMADol  (ULTRAM ) 50 MG tablet    Sig: TAKE 1 TABLET (50 MG TOTAL) BY MOUTH TWICE A DAY AS NEEDED FOR MODERATE PAIN    Dispense:  30 tablet    Refill:  0    For chronic pain from osteoarthritis   metFORMIN  (GLUCOPHAGE ) 500 MG tablet    Sig: Take 1 tablet (500 mg total) by mouth 2 (two) times daily with a meal.    Dispense:  180 tablet    Refill:  3    Note new dose   mupirocin  ointment (BACTROBAN ) 2 %    Sig: Apply topically 2 (two) times daily. AAA upper extremity (poorly healing wound)    Dispense:  44 g    Refill:  1    Orders Placed This Encounter  Procedures   HgB A1c    Patient Instructions  Apply mupirocin  ointment sent to pharmacy twice daily for next 1-2 weeks to left forearm sores. Let us  know if not improving with this. Increase metformin  to 500mg  twice daily, but if sugars start dropping, decrease to once daily.  We are working towards switching from ozempic  to trulicity  (patient assistance program may be available)  Good to see you today Return in 3 months for  physical/wellness visit  Follow up plan: Return in about 3 months (around 11/24/2024) for medicare wellness visit, annual exam, prior fasting for blood work.  Anton Blas, MD   "

## 2024-08-26 NOTE — Assessment & Plan Note (Addendum)
 Skin sores to LUE in setting of recent skin burn with hot grease while frying chicken  Failed OTC topical antibiotic cream Rx topical mupirocin  to cover MRSA. Update if not improved with this.

## 2024-08-26 NOTE — Assessment & Plan Note (Addendum)
 Elevated fasting readings recently, however remains well controlled based on A1c despite running out of ozempic .  Increase metformin  to 500mg  BID.  Will work towards Trulicity  commencement (PAP available in 2026).  Reviewed dropping metformin  back to once daily if any low sugars  RTC 3 mo DM f/u visit  Diabetes associated with HTN, HLD, obesity, osteoarthritis.

## 2024-08-27 ENCOUNTER — Telehealth: Payer: Self-pay

## 2024-08-27 NOTE — Telephone Encounter (Signed)
 Received approval letter [employer] (Trulicity ) thru 08/20/2025, approval letter index.

## 2024-08-28 ENCOUNTER — Ambulatory Visit
Admission: RE | Admit: 2024-08-28 | Discharge: 2024-08-28 | Disposition: A | Discharge status: Home / Self Care | Source: Ambulatory Visit | Attending: Family Medicine | Admitting: Family Medicine

## 2024-08-28 DIAGNOSIS — Z1231 Encounter for screening mammogram for malignant neoplasm of breast: Secondary | ICD-10-CM

## 2024-09-01 ENCOUNTER — Ambulatory Visit: Payer: Self-pay | Admitting: Family Medicine

## 2024-09-04 ENCOUNTER — Telehealth: Payer: Self-pay

## 2024-09-04 ENCOUNTER — Telehealth: Payer: Self-pay | Admitting: Cardiology

## 2024-09-04 NOTE — Telephone Encounter (Signed)
 Patient has been scheduled for televisit med rec and consent done     Patient Consent for Virtual Visit         [name] has provided verbal consent on 09/04/2024 for a virtual visit (video or telephone).   CONSENT FOR VIRTUAL VISIT FOR:  [name]  By participating in this virtual visit I agree to the following:  I hereby voluntarily request, consent and authorize [facility] and its employed or contracted physicians, physician assistants, nurse practitioners or other licensed health care professionals (the Practitioner), to provide me with telemedicine health care services (the Services) as deemed necessary by the treating Practitioner. I acknowledge and consent to receive the Services by the Practitioner via telemedicine. I understand that the telemedicine visit will involve communicating with the Practitioner through live audiovisual communication technology and the disclosure of certain medical information by electronic transmission. I acknowledge that I have been given the opportunity to request an in-person assessment or other available alternative prior to the telemedicine visit and am voluntarily participating in the telemedicine visit.  I understand that I have the right to withhold or withdraw my consent to the use of telemedicine in the course of my care at any time, without affecting my right to future care or treatment, and that the Practitioner or I may terminate the telemedicine visit at any time. I understand that I have the right to inspect all information obtained and/or recorded in the course of the telemedicine visit and may receive copies of available information for a reasonable fee.  I understand that some of the potential risks of receiving the Services via telemedicine include:  Delay or interruption in medical evaluation due to technological equipment failure or disruption; Information transmitted may not be sufficient (e.g. poor resolution of  images) to allow for appropriate medical decision making by the Practitioner; and/or  In rare instances, security protocols could fail, causing a breach of personal health information.  Furthermore, I acknowledge that it is my responsibility to provide information about my medical history, conditions and care that is complete and accurate to the best of my ability. I acknowledge that Practitioner's advice, recommendations, and/or decision may be based on factors not within their control, such as incomplete or inaccurate data provided by me or distortions of diagnostic images or specimens that may result from electronic transmissions. I understand that the practice of medicine is not an exact science and that Practitioner makes no warranties or guarantees regarding treatment outcomes. I acknowledge that a copy of this consent can be made available to me via my patient portal [facility] MyChart), or I can request a printed copy by calling the office of [facility].    I understand that my insurance will be billed for this visit.   I have read or had this consent read to me. I understand the contents of this consent, which adequately explains the benefits and risks of the Services being provided via telemedicine.  I have been provided ample opportunity to ask questions regarding this consent and the Services and have had my questions answered to my satisfaction. I give my informed consent for the services to be provided through the use of telemedicine in my medical care   3

## 2024-09-04 NOTE — Telephone Encounter (Signed)
"  ° °  Pre-operative Risk Assessment    Patient Name: [name]  DOB: 02/28/1951 MRN: [id number]   Date of last office visit: 11/21/23 Date of next office visit: n/a   Request for Surgical Clearance    Procedure:  Right TKR  Date of Surgery:  Clearance 11/04/24                                Surgeon:  Dr Marchia Surgeon's Group or Practice Name:  [facility] Phone number:  [phone] Fax number:  [phone]   Type of Clearance Requested:   - Medical    Type of Anesthesia:  Not Indicated   Additional requests/questions:    [name]   09/04/2024, 3:45 PM   "

## 2024-09-04 NOTE — Telephone Encounter (Signed)
" ° °  Name: [name]  DOB: 30-Apr-1951  MRN: [id number]  Primary Cardiologist: Redell Cave, MD   Preoperative team, please contact this patient and set up a phone call appointment for further preoperative risk assessment. Please obtain consent and complete medication review. Thank you for your help.  I confirm that guidance regarding antiplatelet and oral anticoagulation therapy has been completed and, if necessary, noted below.  Ideally aspirin  should be continued without interruption, however if the bleeding risk is too great, aspirin  may be held for 5-7 days prior to surgery. Please resume aspirin  post operatively when it is felt to be safe from a bleeding standpoint.    I also confirmed the patient resides in the state of [place] . As per [facility] Medical Board telemedicine laws, the patient must reside in the state in which the provider is licensed.   Lum LITTIE Louis, NP 09/04/2024, 4:09 PM [facility]    "

## 2024-09-04 NOTE — Telephone Encounter (Signed)
 Patient has been scheduled for TELEVISIT

## 2024-09-04 NOTE — Telephone Encounter (Signed)
Pt states she will call back. 

## 2024-09-04 NOTE — Telephone Encounter (Signed)
 Pt needs pre-op appt for right total knee arthroplasty. Pre-op clearance form from [facility] in my pending folder. Surgery is tentatively scheduled for 11-04-24  Please call and schedule pt

## 2024-09-16 ENCOUNTER — Ambulatory Visit: Admitting: Family Medicine

## 2024-09-17 ENCOUNTER — Other Ambulatory Visit: Payer: Self-pay | Admitting: Family Medicine

## 2024-09-17 DIAGNOSIS — E1169 Type 2 diabetes mellitus with other specified complication: Secondary | ICD-10-CM

## 2024-09-22 ENCOUNTER — Other Ambulatory Visit: Payer: Self-pay | Admitting: Family Medicine

## 2024-09-22 DIAGNOSIS — E1169 Type 2 diabetes mellitus with other specified complication: Secondary | ICD-10-CM

## 2024-09-24 ENCOUNTER — Other Ambulatory Visit: Payer: Self-pay | Admitting: Family Medicine

## 2024-09-24 ENCOUNTER — Ambulatory Visit: Admitting: Physician Assistant

## 2024-09-24 DIAGNOSIS — Z01810 Encounter for preprocedural cardiovascular examination: Secondary | ICD-10-CM

## 2024-09-24 DIAGNOSIS — M797 Fibromyalgia: Secondary | ICD-10-CM

## 2024-09-24 NOTE — Progress Notes (Signed)
 "   Virtual Visit via Telephone Note   Because of [name] co-morbid illnesses, she is at least at moderate risk for complications without adequate follow up.  This format is felt to be most appropriate for this patient at this time.  Due to technical limitations with video connection (technology), today's appointment will be conducted as an audio only telehealth visit, and [name] verbally agreed to proceed in this manner.   All issues noted in this document were discussed and addressed.  No physical exam could be performed with this format.  Evaluation Performed:  Preoperative cardiovascular risk assessment _____________   Date:  09/24/2024   Patient ID:  [name], DOB 09/04/1950, MRN [id number] Patient Location:  Home Provider location:   Office  Primary Care Provider:  Rilla Baller, MD Primary Cardiologist:  Redell Cave, MD  Chief Complaint / Patient Profile   74 y.o. y/o female with a hx of CAD/CABG x 1 (LIMA-LAD, 07/2020), hypertension, hyperlipidemia, diabetes, former smoker x40 years, COPD, OSA, morbid obesity who is pending right TKR and presents today for telephonic preoperative cardiovascular risk assessment.  History of Present Illness    [name] is a 74 y.o. female who presents via audio/video conferencing for a telehealth visit today.  Pt was last seen in cardiology clinic on 11/21/23 by Dr. Cave.  At that time [name] was doing well.  The patient is now pending procedure as outlined above. Since her last visit, she has some fluttering every now and then when she is doing something. When she relaxes it goes away. No chest pain but sometimes she does have SOB but it rare.   She walks with a cane or a walker. She has knee pain and some times has a breathing issues. She can walk for a while and sit down.    Ideally aspirin  should be continued without interruption, however if the bleeding risk is too great, aspirin  may be held  for 5-7 days prior to surgery. Please resume aspirin  post operatively when it is felt to be safe from a bleeding standpoint.   Past Medical History    Past Medical History:  Diagnosis Date   Anxiety    Arthritis    per patient all over body   Bipolar disorder [facility])    has stopped seeing psychiatrist   CAD (coronary artery disease)    a. 06/2020 MV: small, sev, apical inf/apical defect-scar vs ischemia. EF 45%; b. 06/2020 Cath: LM 64m, LAD 70ost, 50m, LCX nl, RCA nl; c. 07/2020 CABG x 1: LIMA->LAD; d. 03/2022 MV: Significantly degraded imaging due to breast attenuation/obesity/extracardiac activity. No obvious isch/scar. EF 30-35% (EF 50-55% w/o rwma on f/u echo).   Cataract    Chronic bronchitis    COPD (chronic obstructive pulmonary disease) (HCC)    Depression    Dyspnea 2012   s/p pulm/cards w/u WNL, thought due to obesity/deconditioning   Emphysema of lung (HCC)    Fibromyalgia    GERD (gastroesophageal reflux disease)    HFmrEF (heart failure with mid-range ejection fraction) (HCC)    a. 05/2020 Echo: EF 40%, glob HK, mild LVH, Gr1 DD; b. 06/2020 EF by SPECT: 45%; c. 07/2020 intraop TEE: EF 50%; d. 03/2022 Echo: EF 50-55%, no rwma, GrI DD, mild LVH, nl RV fxn, mild MR.   History of chicken pox    HLD (hyperlipidemia)    Hypertension    Hypothyroidism    Ischemic cardiomyopathy    a. 05/2020  Echo: EF 40%; b. 06/2020 EF by SPECT: 45%; c. 07/2020 intraop TEE: EF 50%.   Migraine    OSA (obstructive sleep apnea) 03/16/2011   Pneumonia    Sleep apnea    per patient cannot tolerate CPAP   T2DM (type 2 diabetes mellitus) (HCC) 2012   DM education 06/2011   Urinary incontinence    Past Surgical History:  Procedure Laterality Date   BREAST CYST ASPIRATION Right 2003   cardiac catherization  03/2011   x3 [place]), mild nonobstructive CAD   COLONOSCOPY  12/2011   hyperplastic polyp x2,. diverticulosis, rec rpt 10 yrs   CORONARY ARTERY BYPASS GRAFT N/A 08/03/2020   Procedure:  CORONARY ARTERY BYPASS GRAFTING TIMES ONE USING LEFT INTERNAL MAMMARY ARTERY.;  Surgeon: Lucas Dorise POUR, MD;  Location: [facility] OR;  Service: Open Heart Surgery;  Laterality: N/A;   ESOPHAGOGASTRODUODENOSCOPY  2006   ESOPHAGOGASTRODUODENOSCOPY  05/2020   no esophageal abnormality to explain dysphagia - esophagus dilated. Erosive gastropathy - neg H pylori [facility])   JOINT REPLACEMENT     knee   KNEE SURGERY  2006   left, torn meniscus   RIGHT/LEFT HEART CATH AND CORONARY ANGIOGRAPHY N/A 07/13/2020   Procedure: RIGHT/LEFT HEART CATH AND CORONARY ANGIOGRAPHY;  Surgeon: Mady Bruckner, MD;  Location: [facility] INVASIVE CV LAB;  Service: Cardiovascular;  Laterality: N/A;   TEE WITHOUT CARDIOVERSION N/A 08/03/2020   Procedure: TRANSESOPHAGEAL ECHOCARDIOGRAM (TEE);  Surgeon: Lucas Dorise POUR, MD;  Location: [facility];  Service: Open Heart Surgery;  Laterality: N/A;   TOTAL ABDOMINAL HYSTERECTOMY  2004   fibroids, cervix remained   TOTAL KNEE ARTHROPLASTY Left 08/2012   [place], [place] [facility]    Allergies  Allergies[1]  Home Medications    Prior to Admission medications  Medication Sig Start Date End Date Taking? Authorizing Provider  acetaminophen  (TYLENOL  8 HOUR ARTHRITIS PAIN) 650 MG CR tablet Take 650 mg by mouth every 8 (eight) hours as needed for pain.    [provider]  albuterol  (VENTOLIN  HFA) 108 (90 Base) MCG/ACT inhaler Inhale 2 puffs into the lungs every 6 (six) hours as needed for wheezing or shortness of breath. 03/29/23   Tamea Dedra CROME, MD  aspirin  81 MG EC tablet Take 1 tablet (81 mg total) by mouth daily. 10/08/20   Darliss Rogue, MD  Blood Glucose Monitoring Suppl DEVI 1 each by Does not apply route as directed. Dispense based on patient and insurance preference. Use up to four times daily as directed. (FOR ICD-10 E10.9, E11.9). 08/22/24   Rilla Baller, MD  Boswellia-Glucosamine-Vit D (OSTEO BI-FLEX ONE PER DAY PO) Take 1 tablet by mouth daily.    [provider]  budesonide -glycopyrrolate -formoterol  (BREZTRI  AEROSPHERE) 160-9-4.8 MCG/ACT AERO inhaler Inhale 2 puffs into the lungs in the morning and at bedtime. 07/07/24   Tamea Dedra CROME, MD  Calcium  Carb-Cholecalciferol (CALCIUM  + VITAMIN D3 PO) Take by mouth daily.    [provider]  cyclobenzaprine  (FLEXERIL ) 10 MG tablet TAKE 1/2 TO 1 TABLET BY MOUTH TWICE A DAY AS NEEDED FOR MUSCLE SPASMS AND MIGRAINE 05/21/24   Rilla Baller, MD  Dulaglutide  (TRULICITY ) 1.5 MG/0.5ML SOAJ Inject 1.5 mg into the skin once a week. Patient not taking: Reported on 08/26/2024 09/19/24   Rilla Baller, MD  famotidine  (PEPCID ) 20 MG tablet Take 20 mg by mouth every other day. Alternates with omeprazole     [provider]  gabapentin  (NEURONTIN ) 300 MG capsule TAKE 1 CAPSULE TWICE DAILY 07/07/24   Rilla Baller, MD  glucose blood (ACCU-CHEK GUIDE TEST) test strip USE UP TO FOUR TIMES DAILY AS DIRECTED. (FOR ICD-10 E10.9, E11.9). 09/17/24   Rilla Baller, MD  Iron , Ferrous Sulfate , 325 (65 Fe) MG TABS Take 325 mg by mouth once a week. Patient taking differently: Take 325 mg by mouth once a week. Taking twice a week 11/15/22   Rilla Baller, MD  isosorbide  mononitrate (IMDUR ) 60 MG 24 hr tablet TAKE 0.5 TABLETS (30 MG TOTAL) BY MOUTH 2 (TWO) TIMES DAILY. 04/14/24 08/26/24  Darliss Rogue, MD  Lancet Device MISC 1 each by Does not apply route as directed. Dispense based on patient and insurance preference. Use up to four times daily as directed. (FOR ICD-10 E10.9, E11.9). 08/22/24   Rilla Baller, MD  Lancets MISC 1 each by Does not apply route as directed. Dispense based on patient and insurance preference. Use up to four times daily as directed. (FOR ICD-10 E10.9, E11.9). 08/22/24   Rilla Baller, MD  levothyroxine  (SYNTHROID ) 100 MCG tablet Take 1 tablet (100 mcg total) by mouth daily before breakfast. 11/23/23   Rilla Baller, MD  loratadine  (CLARITIN ) 10 MG tablet Take 1  tablet (10 mg total) by mouth daily. 03/22/20   Rilla Baller, MD  metFORMIN  (GLUCOPHAGE ) 500 MG tablet Take 1 tablet (500 mg total) by mouth 2 (two) times daily with a meal. 08/26/24   Rilla Baller, MD  metoprolol  succinate (TOPROL -XL) 25 MG 24 hr tablet Take 0.5 tablets (12.5 mg total) by mouth daily. 11/23/23   Rilla Baller, MD  mupirocin  ointment (BACTROBAN ) 2 % Apply topically 2 (two) times daily. AAA upper extremity (poorly healing wound) 08/26/24   Rilla Baller, MD  omeprazole  (PRILOSEC) 20 MG capsule Take 1 capsule (20 mg total) by mouth every other day. 11/23/23   Rilla Baller, MD  OXYGEN  Inhale 2 L into the lungs at bedtime.    [provider]  ranolazine  (RANEXA ) 500 MG 12 hr tablet TAKE 1 TABLET TWICE A DAY 07/07/24   Darliss Rogue, MD  Rhubarb (ESTROVEN COMPLETE PO) Take 1 tablet by mouth daily.    [provider]  simvastatin  (ZOCOR ) 40 MG tablet Take 1 tablet (40 mg total) by mouth at bedtime. 11/23/23   Rilla Baller, MD  torsemide  (DEMADEX ) 20 MG tablet TAKE 0.5 TABLETS (10 MG TOTAL) BY MOUTH AS DIRECTED. TAKE ONE HALF TABLET (10 MG) ONCE A DAY AND MAY TAKE ADDITIONAL ONE HALF TABLET (10 MG) AS NEEDED FOR WEIGHT GAIN OR SHORTNESS OF BREATH. 06/13/24   Darliss Rogue, MD  traMADol  (ULTRAM ) 50 MG tablet TAKE 1 TABLET (50 MG TOTAL) BY MOUTH TWICE A DAY AS NEEDED FOR MODERATE PAIN 08/26/24   Rilla Baller, MD  TRULICITY  0.75 MG/0.5ML SOAJ INJECT 0.75MG  (0.5ML) UNDER THE SKIN ONCE A WEEK FOR 4 WEEKS THEN INCREASE TO 1.5 MG WEEKLY. 09/24/24   Rilla Baller, MD  Turmeric 500 MG CAPS Take 1 capsule by mouth 2 (two) times daily as needed. 07/24/23   Rilla Baller, MD  vitamin B-12 (CYANOCOBALAMIN ) 500 MCG tablet Take 1 tablet (500 mcg total) by mouth 2 (two) times a week. Patient taking differently: Take 500 mcg by mouth 2 (two) times a week. Patient states she takes 1 tablet 1 time per week. 11/26/23   Rilla Baller, MD  ziprasidone  (GEODON )  40 MG capsule Take 1 capsule (40 mg total) by mouth 2 (two) times daily with a meal. 11/23/23   Rilla Baller, MD    Physical Exam    Vital Signs:  [name] does not have vital signs available for review today.  Given telephonic nature of communication, physical exam is limited. AAOx3. NAD. Normal affect.  Speech and respirations are unlabored.  Accessory Clinical Findings    None  Assessment & Plan    1.  Preoperative Cardiovascular Risk Assessment:  Ms. Tippett's perioperative risk of a major cardiac event is 6.6% according to the Revised Cardiac Risk Index (RCRI).  Therefore, she is at high risk for perioperative complications.   Her functional capacity is fair at 4.06 METs according to the Duke Activity Status Index (DASI). Recommendations: According to ACC/AHA guidelines, no further cardiovascular testing needed.  The patient may proceed to surgery at acceptable risk.   Antiplatelet and/or Anticoagulation Recommendations: Aspirin  can be held for 5-7 days prior to her surgery.  Please resume Aspirin  post operatively when it is felt to be safe from a bleeding standpoint.    The patient was advised that if she develops new symptoms prior to surgery to contact our office to arrange for a follow-up visit, and she verbalized understanding.   A copy of this note will be routed to requesting surgeon.  Time:   Today, I have spent 7 minutes with the patient with telehealth technology discussing medical history, symptoms, and management plan.     Orren LOISE Fabry, PA-C  09/24/2024, 8:30 AM      [1]  Allergies Allergen Reactions   Crestor [Rosuvastatin] Other (See Comments)    myalgias   Lipitor [Atorvastatin ] Other (See Comments)    Mood swings   Lithium Other (See Comments)    abd pain, n/v, was hospitalized   Quetiapine Other (See Comments)    Muscle twitching, muscle spasm   "

## 2024-09-25 NOTE — Telephone Encounter (Signed)
 ERx

## 2024-10-02 ENCOUNTER — Ambulatory Visit: Admitting: Pulmonary Disease

## 2024-10-14 ENCOUNTER — Ambulatory Visit: Admitting: Family Medicine

## 2024-11-18 ENCOUNTER — Other Ambulatory Visit

## 2024-11-25 ENCOUNTER — Encounter: Admitting: Family Medicine

## 2025-02-12 ENCOUNTER — Ambulatory Visit

## 2025-02-13 ENCOUNTER — Ambulatory Visit

## 2025-02-17 ENCOUNTER — Ambulatory Visit
